# Patient Record
Sex: Female | Born: 1995
Health system: Southern US, Community
[De-identification: ages and names within clinical notes are randomized; demographics above are authoritative.]

## PROBLEM LIST (undated history)

## (undated) DIAGNOSIS — D509 Iron deficiency anemia, unspecified: Secondary | ICD-10-CM

## (undated) DIAGNOSIS — E2740 Unspecified adrenocortical insufficiency: Secondary | ICD-10-CM

## (undated) DIAGNOSIS — K121 Other forms of stomatitis: Secondary | ICD-10-CM

## (undated) DIAGNOSIS — G8929 Other chronic pain: Secondary | ICD-10-CM

## (undated) DIAGNOSIS — J181 Lobar pneumonia, unspecified organism: Secondary | ICD-10-CM

## (undated) DIAGNOSIS — B370 Candidal stomatitis: Secondary | ICD-10-CM

## (undated) DIAGNOSIS — R1310 Dysphagia, unspecified: Secondary | ICD-10-CM

## (undated) DIAGNOSIS — I82401 Acute embolism and thrombosis of unspecified deep veins of right lower extremity: Secondary | ICD-10-CM

## (undated) DIAGNOSIS — R600 Localized edema: Secondary | ICD-10-CM

## (undated) DIAGNOSIS — T451X1A Poisoning by antineoplastic and immunosuppressive drugs, accidental (unintentional), initial encounter: Secondary | ICD-10-CM

## (undated) DIAGNOSIS — B373 Candidiasis of vulva and vagina: Secondary | ICD-10-CM

## (undated) DIAGNOSIS — K1230 Oral mucositis (ulcerative), unspecified: Secondary | ICD-10-CM

## (undated) DIAGNOSIS — E559 Vitamin D deficiency, unspecified: Secondary | ICD-10-CM

## (undated) DIAGNOSIS — R000 Tachycardia, unspecified: Secondary | ICD-10-CM

## (undated) DIAGNOSIS — J45909 Unspecified asthma, uncomplicated: Secondary | ICD-10-CM

## (undated) DIAGNOSIS — D72819 Decreased white blood cell count, unspecified: Secondary | ICD-10-CM

## (undated) DIAGNOSIS — Z3042 Encounter for surveillance of injectable contraceptive: Secondary | ICD-10-CM

## (undated) DIAGNOSIS — I7300 Raynaud's syndrome without gangrene: Secondary | ICD-10-CM

## (undated) DIAGNOSIS — M109 Gout, unspecified: Secondary | ICD-10-CM

## (undated) DIAGNOSIS — R300 Dysuria: Secondary | ICD-10-CM

## (undated) DIAGNOSIS — J302 Other seasonal allergic rhinitis: Secondary | ICD-10-CM

## (undated) DIAGNOSIS — I517 Cardiomegaly: Secondary | ICD-10-CM

## (undated) DIAGNOSIS — J40 Bronchitis, not specified as acute or chronic: Secondary | ICD-10-CM

## (undated) DIAGNOSIS — M329 Systemic lupus erythematosus, unspecified: Secondary | ICD-10-CM

## (undated) DIAGNOSIS — N390 Urinary tract infection, site not specified: Secondary | ICD-10-CM

## (undated) DIAGNOSIS — E872 Acidosis: Secondary | ICD-10-CM

## (undated) DIAGNOSIS — I10 Essential (primary) hypertension: Secondary | ICD-10-CM

## (undated) DIAGNOSIS — E860 Dehydration: Secondary | ICD-10-CM

## (undated) DIAGNOSIS — R0781 Pleurodynia: Secondary | ICD-10-CM

## (undated) DIAGNOSIS — IMO0002 Reserved for concepts with insufficient information to code with codable children: Secondary | ICD-10-CM

## (undated) DIAGNOSIS — M170 Bilateral primary osteoarthritis of knee: Secondary | ICD-10-CM

## (undated) DIAGNOSIS — D849 Immunodeficiency, unspecified: Secondary | ICD-10-CM

## (undated) DIAGNOSIS — G4700 Insomnia, unspecified: Secondary | ICD-10-CM

## (undated) DIAGNOSIS — F329 Major depressive disorder, single episode, unspecified: Secondary | ICD-10-CM

## (undated) DIAGNOSIS — A419 Sepsis, unspecified organism: Secondary | ICD-10-CM

## (undated) HISTORY — DX: Poisoning by antineoplastic and immunosuppressive drugs, accidental (unintentional), initial encounter: T45.1X1A

## (undated) HISTORY — PX: TONSILLECTOMY: SUR1361

## (undated) HISTORY — DX: Localized edema: R60.0

## (undated) HISTORY — DX: Gout, unspecified: M10.9

## (undated) HISTORY — DX: Systemic lupus erythematosus, unspecified: M32.9

## (undated) HISTORY — DX: Immunodeficiency, unspecified: D84.9

## (undated) HISTORY — DX: Other forms of stomatitis: K12.1

## (undated) HISTORY — PX: RENAL BIOPSY: SHX156

## (undated) HISTORY — DX: Unspecified asthma, uncomplicated: J45.909

## (undated) HISTORY — DX: Urinary tract infection, site not specified: N39.0

## (undated) HISTORY — PX: ORIF SHOULDER FRACTURE: SHX5035

---
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
## 2013-10-23 ENCOUNTER — Emergency Department (HOSPITAL_BASED_OUTPATIENT_CLINIC_OR_DEPARTMENT_OTHER): Payer: No Typology Code available for payment source

## 2013-10-23 ENCOUNTER — Emergency Department (HOSPITAL_BASED_OUTPATIENT_CLINIC_OR_DEPARTMENT_OTHER)
Admission: EM | Admit: 2013-10-23 | Discharge: 2013-10-23 | Disposition: A | Discharge status: Home / Self Care | Payer: No Typology Code available for payment source | Attending: Emergency Medicine | Admitting: Emergency Medicine

## 2013-10-23 ENCOUNTER — Encounter (HOSPITAL_BASED_OUTPATIENT_CLINIC_OR_DEPARTMENT_OTHER): Payer: Self-pay | Admitting: Emergency Medicine

## 2013-10-23 DIAGNOSIS — S0990XA Unspecified injury of head, initial encounter: Secondary | ICD-10-CM | POA: Insufficient documentation

## 2013-10-23 DIAGNOSIS — S139XXA Sprain of joints and ligaments of unspecified parts of neck, initial encounter: Secondary | ICD-10-CM | POA: Insufficient documentation

## 2013-10-23 DIAGNOSIS — Y92410 Unspecified street and highway as the place of occurrence of the external cause: Secondary | ICD-10-CM | POA: Insufficient documentation

## 2013-10-23 DIAGNOSIS — S161XXA Strain of muscle, fascia and tendon at neck level, initial encounter: Secondary | ICD-10-CM

## 2013-10-23 DIAGNOSIS — Y9389 Activity, other specified: Secondary | ICD-10-CM | POA: Insufficient documentation

## 2013-10-23 DIAGNOSIS — S46911A Strain of unspecified muscle, fascia and tendon at shoulder and upper arm level, right arm, initial encounter: Secondary | ICD-10-CM

## 2013-10-23 DIAGNOSIS — IMO0002 Reserved for concepts with insufficient information to code with codable children: Secondary | ICD-10-CM | POA: Insufficient documentation

## 2013-10-23 DIAGNOSIS — S0993XA Unspecified injury of face, initial encounter: Secondary | ICD-10-CM | POA: Diagnosis present

## 2013-10-23 MED ORDER — CYCLOBENZAPRINE HCL 10 MG PO TABS: 10.0000 mg | ORAL_TABLET | Freq: Three times a day (TID) | ORAL | Status: DC | PRN

## 2013-10-23 MED ORDER — IBUPROFEN 800 MG PO TABS: 800.0000 mg | ORAL_TABLET | Freq: Three times a day (TID) | ORAL | Status: DC | PRN
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
## 2013-10-23 NOTE — Discharge Instructions (Signed)
Cervical Sprain A cervical sprain is an injury in the neck in which the strong, fibrous tissues (ligaments) that connect your neck bones stretch or tear. Cervical sprains can range from mild to severe. Severe cervical sprains can cause the neck vertebrae to be unstable. This can lead to damage of the spinal cord and can result in serious nervous system problems. The amount of time it takes for a cervical sprain to get better depends on the cause and extent of the injury. Most cervical sprains heal in 1 to 3 weeks. CAUSES  Severe cervical sprains may be caused by:   Contact sport injuries (such as from football, rugby, wrestling, hockey, auto racing, gymnastics, diving, martial arts, or boxing).   Motor vehicle collisions.   Whiplash injuries. This is an injury from a sudden forward-and backward whipping movement of the head and neck.  Falls.  Mild cervical sprains may be caused by:   Being in an awkward position, such as while cradling a telephone between your ear and shoulder.   Sitting in a chair that does not offer proper support.   Working at a poorly Landscape architect station.   Looking up or down for long periods of time.  SYMPTOMS   Pain, soreness, stiffness, or a burning sensation in the front, back, or sides of the neck. This discomfort may develop immediately after the injury or slowly, 24 hours or more after the injury.   Pain or tenderness directly in the middle of the back of the neck.   Shoulder or upper back pain.   Limited ability to move the neck.   Headache.   Dizziness.   Weakness, numbness, or tingling in the hands or arms.   Muscle spasms.   Difficulty swallowing or chewing.   Tenderness and swelling of the neck.  DIAGNOSIS  Most of the time your health care provider can diagnose a cervical sprain by taking your history and doing a physical exam. Your health care provider will ask about previous neck injuries and any known neck  problems, such as arthritis in the neck. X-rays may be taken to find out if there are any other problems, such as with the bones of the neck. Other tests, such as a CT scan or MRI, may also be needed.  TREATMENT  Treatment depends on the severity of the cervical sprain. Mild sprains can be treated with rest, keeping the neck in place (immobilization), and pain medicines. Severe cervical sprains are immediately immobilized. Further treatment is done to help with pain, muscle spasms, and other symptoms and may include:  Medicines, such as pain relievers, numbing medicines, or muscle relaxants.   Physical therapy. This may involve stretching exercises, strengthening exercises, and posture training. Exercises and improved posture can help stabilize the neck, strengthen muscles, and help stop symptoms from returning.  HOME CARE INSTRUCTIONS   Put ice on the injured area.   Put ice in a plastic bag.   Place a towel between your skin and the bag.   Leave the ice on for 15 20 minutes, 3 4 times a day.   If your injury was severe, you may have been given a cervical collar to wear. A cervical collar is a two-piece collar designed to keep your neck from moving while it heals.  Do not remove the collar unless instructed by your health care provider.  If you have long hair, keep it outside of the collar.  Ask your health care provider before making any adjustments to your collar.  Minor adjustments may be required over time to improve comfort and reduce pressure on your chin or on the back of your head.  Ifyou are allowed to remove the collar for cleaning or bathing, follow your health care provider's instructions on how to do so safely.  Keep your collar clean by wiping it with mild soap and water and drying it completely. If the collar you have been given includes removable pads, remove them every 1 2 days and hand wash them with soap and water. Allow them to air dry. They should be completely  dry before you wear them in the collar.  If you are allowed to remove the collar for cleaning and bathing, wash and dry the skin of your neck. Check your skin for irritation or sores. If you see any, tell your health care provider.  Do not drive while wearing the collar.   Only take over-the-counter or prescription medicines for pain, discomfort, or fever as directed by your health care provider.   Keep all follow-up appointments as directed by your health care provider.   Keep all physical therapy appointments as directed by your health care provider.   Make any needed adjustments to your workstation to promote good posture.   Avoid positions and activities that make your symptoms worse.   Warm up and stretch before being active to help prevent problems.  SEEK MEDICAL CARE IF:   Your pain is not controlled with medicine.   You are unable to decrease your pain medicine over time as planned.   Your activity level is not improving as expected.  SEEK IMMEDIATE MEDICAL CARE IF:   You develop any bleeding.  You develop stomach upset.  You have signs of an allergic reaction to your medicine.   Your symptoms get worse.   You develop new, unexplained symptoms.   You have numbness, tingling, weakness, or paralysis in any part of your body.  MAKE SURE YOU:   Understand these instructions.  Will watch your condition.  Will get help right away if you are not doing well or get worse. Document Released: 06/26/2007 Document Revised: 06/19/2013 Document Reviewed: 03/06/2013 [facility] Patient Information 2014 [place].  Motor Vehicle Collision  It is common to have multiple bruises and sore muscles after a motor vehicle collision (MVC). These tend to feel worse for the first 24 hours. You may have the most stiffness and soreness over the first several hours. You may also feel worse when you wake up the first morning after your collision. After this point, you will  usually begin to improve with each day. The speed of improvement often depends on the severity of the collision, the number of injuries, and the location and nature of these injuries. HOME CARE INSTRUCTIONS   Put ice on the injured area.  Put ice in a plastic bag.  Place a towel between your skin and the bag.  Leave the ice on for 15-20 minutes, 03-04 times a day.  Drink enough fluids to keep your urine clear or pale yellow. Do not drink alcohol.  Take a warm shower or bath once or twice a day. This will increase blood flow to sore muscles.  You may return to activities as directed by your caregiver. Be careful when lifting, as this may aggravate neck or back pain.  Only take over-the-counter or prescription medicines for pain, discomfort, or fever as directed by your caregiver. Do not use aspirin. This may increase bruising and bleeding. SEEK IMMEDIATE MEDICAL CARE IF:  You have numbness, tingling, or weakness in the arms or legs.  You develop severe headaches not relieved with medicine.  You have severe neck pain, especially tenderness in the middle of the back of your neck.  You have changes in bowel or bladder control.  There is increasing pain in any area of the body.  You have shortness of breath, lightheadedness, dizziness, or fainting.  You have chest pain.  You feel sick to your stomach (nauseous), throw up (vomit), or sweat.  You have increasing abdominal discomfort.  There is blood in your urine, stool, or vomit.  You have pain in your shoulder (shoulder strap areas).  You feel your symptoms are getting worse. MAKE SURE YOU:   Understand these instructions.  Will watch your condition.  Will get help right away if you are not doing well or get worse. Document Released: 08/29/2005 Document Revised: 11/21/2011 Document Reviewed: 01/26/2011 [facility] Patient Information 2014 [place], [place].  Shoulder Sprain A shoulder sprain is the result of damage to the  tough, fiber-like tissues (ligaments) that help hold your shoulder in place. The ligaments may be stretched or torn. Besides the main shoulder joint (the ball and socket), there are several smaller joints that connect the bones in this area. A sprain usually involves one of those joints. Most often it is the acromioclavicular (or AC) joint. That is the joint that connects the collarbone (clavicle) and the shoulder blade (scapula) at the top point of the shoulder blade (acromion). A shoulder sprain is a mild form of what is called a shoulder separation. Recovering from a shoulder sprain may take some time. For some, pain lingers for several months. Most people recover without long term problems. CAUSES   A shoulder sprain is usually caused by some kind of trauma. This might be:  Falling on an outstretched arm.  Being hit hard on the shoulder.  Twisting the arm.  Shoulder sprains are more likely to occur in people who:  Play sports.  Have balance or coordination problems. SYMPTOMS   Pain when you move your shoulder.  Limited ability to move the shoulder.  Swelling and tenderness on top of the shoulder.  Redness or warmth in the shoulder.  Bruising.  A change in the shape of the shoulder. DIAGNOSIS  Your healthcare provider may:  Ask about your symptoms.  Ask about recent activity that might have caused those symptoms.  Examine your shoulder. You may be asked to do simple exercises to test movement. The other shoulder will be examined for comparison.  Order some tests that provide a look inside the body. They can show the extent of the injury. The tests could include:  X-rays.  CT (computed tomography) scan.  MRI (magnetic resonance imaging) scan. RISKS AND COMPLICATIONS  Loss of full shoulder motion.  Ongoing shoulder pain. TREATMENT  How long it takes to recover from a shoulder sprain depends on how severe it was. Treatment options may include:  Rest. You should  not use the arm or shoulder until it heals.  Ice. For 2 or 3 days after the injury, put an ice pack on the shoulder up to 4 times a day. It should stay on for 15 to 20 minutes each time. Wrap the ice in a towel so it does not touch your skin.  Over-the-counter medicine to relieve pain.  A sling or brace. This will keep the arm still while the shoulder is healing.  Physical therapy or rehabilitation exercises. These will help you regain strength  and motion. Ask your healthcare provider when it is OK to begin these exercises.  Surgery. The need for surgery is rare with a sprained shoulder, but some people may need surgery to keep the joint in place and reduce pain. HOME CARE INSTRUCTIONS   Ask your healthcare provider about what you should and should not do while your shoulder heals.  Make sure you know how to apply ice to the correct area of your shoulder.  Talk with your healthcare provider about which medications should be used for pain and swelling.  If rehabilitation therapy will be needed, ask your healthcare provider to refer you to a therapist. If it is not recommended, then ask about at-home exercises. Find out when exercise should begin. SEEK MEDICAL CARE IF:  Your pain, swelling, or redness at the joint increases. SEEK IMMEDIATE MEDICAL CARE IF:   You have a fever.  You cannot move your arm or shoulder. Document Released: 01/15/2009 Document Revised: 11/21/2011 Document Reviewed: 01/15/2009 [facility] Patient Information 2014 [place], [place].

## 2014-02-07 ENCOUNTER — Encounter (HOSPITAL_COMMUNITY): Payer: Self-pay | Admitting: Emergency Medicine

## 2014-02-07 ENCOUNTER — Emergency Department (HOSPITAL_COMMUNITY)
Admission: EM | Admit: 2014-02-07 | Discharge: 2014-02-07 | Disposition: A | Discharge status: Home / Self Care | Payer: Medicaid Other | Attending: Emergency Medicine | Admitting: Emergency Medicine

## 2014-02-07 ENCOUNTER — Emergency Department (HOSPITAL_COMMUNITY): Payer: Medicaid Other

## 2014-02-07 DIAGNOSIS — Z8679 Personal history of other diseases of the circulatory system: Secondary | ICD-10-CM | POA: Insufficient documentation

## 2014-02-07 DIAGNOSIS — R0602 Shortness of breath: Secondary | ICD-10-CM | POA: Insufficient documentation

## 2014-02-07 DIAGNOSIS — M940 Chondrocostal junction syndrome [Tietze]: Secondary | ICD-10-CM

## 2014-02-07 HISTORY — DX: Raynaud's syndrome without gangrene: I73.00

## 2014-02-07 MED ORDER — IBUPROFEN 800 MG PO TABS
800.0000 mg | ORAL_TABLET | Freq: Once | ORAL | Status: AC
  Administered 2014-02-07: 800 mg via ORAL
  Filled 2014-02-07: qty 1

## 2014-02-07 MED ORDER — IBUPROFEN 800 MG PO TABS: 800.0000 mg | ORAL_TABLET | Freq: Three times a day (TID) | ORAL | Status: DC | PRN

## 2014-02-07 NOTE — ED Notes (Signed)
Initial contact- Mid sternal chest pain starting today at work. Says work is very hot but she became very "cold and my muscles became very tight right before the chest pain started." Denies precipitating events. Endorses nausea but no vomiting. Denies fever, cough.Says deep breaths hurt. 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 Patient is not on birth control or any other medications. No recent surgeries or hospitalization. No other complaints at this time. Moving all 4 extremities. MD Mingo Amber at the bedside.

## 2014-02-07 NOTE — ED Provider Notes (Signed)
CSN: [id number]     Arrival date & time 02/07/14  2017 History   First MD Initiated Contact with Patient 02/07/14 2106     Chief Complaint  Patient presents with  . Chest Pain     (Consider location/radiation/quality/duration/timing/severity/associated sxs/prior Treatment) Patient is a 18 y.o. female presenting with chest pain. The history is provided by the patient.  Chest Pain Pain location:  Substernal area Pain quality: sharp   Pain radiates to:  Does not radiate Pain radiates to the back: no   Pain severity:  Moderate Onset quality:  Sudden Timing:  Constant Progression:  Waxing and waning Chronicity:  New Context: not breathing, not at rest and no stress   Context comment:  Working - cutting pizza Relieved by:  Nothing Worsened by:  Nothing tried Ineffective treatments:  None tried Associated symptoms: no abdominal pain, no back pain, no cough, no dizziness, no fever, no lower extremity edema, no shortness of breath and not vomiting     Past Medical History  Diagnosis Date  . Raynaud disease    Past Surgical History  Procedure Laterality Date  . Fracture surgery     No family history on file. History  Substance Use Topics  . Smoking status: Never Smoker   . Smokeless tobacco: Not on file  . Alcohol Use: No   OB History   Grav Para Term Preterm Abortions TAB SAB Ect Mult Living                 Review of Systems  Constitutional: Negative for fever.  Respiratory: Negative for cough and shortness of breath.   Cardiovascular: Positive for chest pain.  Gastrointestinal: Negative for vomiting and abdominal pain.  Musculoskeletal: Negative for back pain.  Neurological: Negative for dizziness.  All other systems reviewed and are negative.     Allergies  Review of patient's allergies indicates no known allergies.  Home Medications   Prior to Admission medications   Medication Sig Start Date End Date Taking? Authorizing Provider  cyclobenzaprine  (FLEXERIL) 10 MG tablet Take 1 tablet (10 mg total) by mouth 3 (three) times daily as needed for muscle spasms. 10/23/13   Charles B. Karle Starch, MD  ibuprofen (ADVIL,MOTRIN) 800 MG tablet Take 1 tablet (800 mg total) by mouth every 8 (eight) hours as needed. 10/23/13   Charles B. Karle Starch, MD   BP 125/71  Pulse 98  Temp(Src) 98.4 F (36.9 C) (Oral)  Resp 20  SpO2 100%  LMP 01/18/2014 Physical Exam  Nursing note and vitals reviewed. Constitutional: She is oriented to person, place, and time. She appears well-developed and well-nourished. No distress.  HENT:  Head: Normocephalic and atraumatic.  Mouth/Throat: Oropharynx is clear and moist. No oropharyngeal exudate.  Eyes: EOM are normal. Pupils are equal, round, and reactive to light.  Neck: Normal range of motion. Neck supple.  Cardiovascular: Normal rate and regular rhythm.  Exam reveals no friction rub.   No murmur heard. Pulmonary/Chest: Effort normal and breath sounds normal. No respiratory distress. She has no wheezes. She has no rales. She exhibits tenderness (Lower sternum).  Abdominal: Soft. She exhibits no distension. There is no tenderness. There is no rebound.  Musculoskeletal: Normal range of motion. She exhibits no edema.  Neurological: She is alert and oriented to person, place, and time.  Skin: No rash noted. She is not diaphoretic.    ED Course  Procedures (including critical care time) Labs Review Labs Reviewed - No data to display  Imaging Review Dg Chest  2 View  02/07/2014   CLINICAL DATA:  Mid chest pain  EXAM: CHEST  2 VIEW  COMPARISON:  09/04/2008  FINDINGS: The heart size and mediastinal contours are within normal limits. Both lungs are clear. The visualized skeletal structures are unremarkable.  IMPRESSION: No active cardiopulmonary disease.   Electronically Signed   By: Kathreen Devoid   On: 02/07/2014 21:43     EKG Interpretation None       Date: 02/07/2014  Rate: 95  Rhythm: normal sinus rhythm  QRS  Axis: normal  Intervals: Short PR  ST/T Wave abnormalities: normal  Conduction Disutrbances:none  Narrative Interpretation:   Old EKG Reviewed: unchanged   MDM   Final diagnoses:  Costochondritis, acute    56F with sharp central chest pain. History not c/w PE, no risk factors. Patient has sharp pain on lower sternal palpation. Exam c/w costochondritis. Mild SOB because it hurts to breathe, likely due to her costochondral irritation. CXR normal. EKG ok. Improving with motrin. Stable for discharge.    Osvaldo Shipper, MD 02/07/14 [phone]

## 2014-02-07 NOTE — Progress Notes (Signed)
  CARE MANAGEMENT ED NOTE 02/07/2014  Patient:  MARVELINE, PROFETA   Account Number:  [id number]  Date Initiated:  02/07/2014  Documentation initiated by:  Livia Snellen  Subjective/Objective Assessment:   Patient presents to Ed with chest pain.     Subjective/Objective Assessment Detail:     Action/Plan:   Action/Plan Detail:   Anticipated DC Date:       Status Recommendation to Physician:   Result of Recommendation:    Other ED [place]  Other  PCP issues    Choice offered to / List presented to:            Status of service:  Completed, signed off  ED Comments:   ED Comments Detail:  EDCM spoke to patient at bedside.  Patient reports she does not have a pcp with Medicaid insurance.  Upon record review, pcp listed on patient's Medicaid card is [facility] on [address] in [place].  EDCM explained to patient if she would like to change her pcp, she would need to call the DSS.  EDCM also provided patient with pcps who accept Medicaid in [facility].  Patient thankful for assistance.  No further EDCM needs at this time.

## 2014-02-07 NOTE — Discharge Instructions (Signed)
Costochondritis Costochondritis, sometimes called Tietze syndrome, is a swelling and irritation (inflammation) of the tissue (cartilage) that connects your ribs with your breastbone (sternum). It causes pain in the chest and rib area. Costochondritis usually goes away on its own over time. It can take up to 6 weeks or longer to get better, especially if you are unable to limit your activities. CAUSES  Some cases of costochondritis have no known cause. Possible causes include:  Injury (trauma).  Exercise or activity such as lifting.  Severe coughing. SIGNS AND SYMPTOMS  Pain and tenderness in the chest and rib area.  Pain that gets worse when coughing or taking deep breaths.  Pain that gets worse with specific movements. DIAGNOSIS  Your health care provider will do a physical exam and ask about your symptoms. Chest X-rays or other tests may be done to rule out other problems. TREATMENT  Costochondritis usually goes away on its own over time. Your health care provider may prescribe medicine to help relieve pain. HOME CARE INSTRUCTIONS   Avoid exhausting physical activity. Try not to strain your ribs during normal activity. This would include any activities using chest, abdominal, and side muscles, especially if heavy weights are used.  Apply ice to the affected area for the first 2 days after the pain begins.  Put ice in a plastic bag.  Place a towel between your skin and the bag.  Leave the ice on for 20 minutes, 2 3 times a day.  Only take over-the-counter or prescription medicines as directed by your health care provider. SEEK MEDICAL CARE IF:  You have redness or swelling at the rib joints. These are signs of infection.  Your pain does not go away despite rest or medicine. SEEK IMMEDIATE MEDICAL CARE IF:   Your pain increases or you are very uncomfortable.  You have shortness of breath or difficulty breathing.  You cough up blood.  You have worse chest pains,  sweating, or vomiting.  You have a fever or persistent symptoms for more than 2 3 days.  You have a fever and your symptoms suddenly get worse. MAKE SURE YOU:   Understand these instructions.  Will watch your condition.  Will get help right away if you are not doing well or get worse. Document Released: 06/08/2005 Document Revised: 06/19/2013 Document Reviewed: 04/02/2013 [facility] Patient Information 2014 [place].   Emergency Department Resource Guide 1) Find a Doctor and Pay Out of Pocket Although you won't have to find out who is covered by your insurance plan, it is a good idea to ask around and get recommendations. You will then need to call the office and see if the doctor you have chosen will accept you as a new patient and what types of options they offer for patients who are self-pay. Some doctors offer discounts or will set up payment plans for their patients who do not have insurance, but you will need to ask so you aren't surprised when you get to your appointment.  2) Contact Your Local Health Department Not all health departments have doctors that can see patients for sick visits, but many do, so it is worth a call to see if yours does. If you don't know where your local health department is, you can check in your phone book. The CDC also has a tool to help you locate your state's health department, and many state websites also have listings of all of their local health departments.  3) Find a [employer] If your  illness is not likely to be very severe or complicated, you may want to try a walk in clinic. These are popping up all over the country in pharmacies, drugstores, and shopping centers. They're usually staffed by nurse practitioners or physician assistants that have been trained to treat common illnesses and complaints. They're usually fairly quick and inexpensive. However, if you have serious medical issues or chronic medical problems, these are probably not  your best option.  No Primary Care Doctor: - Call Health Connect at  [phone] - they can help you locate a primary care doctor that  accepts your insurance, provides certain services, etc. - Physician Referral Service- [phone]  Chronic Pain Problems: Organization         Address  Phone   Notes  [facility]  [phone] Patients need to be referred by their primary care doctor.   Medication Assistance: Organization         Address  Phone   Notes  [facility] Medication [facility] [address] [place], [place] [address] [phone] --Must be a resident of [facility] -- Must have NO insurance coverage whatsoever (no Medicaid/ Medicare, etc.) -- The pt. MUST have a primary care doctor that directs their care regularly and follows them in the community   MedAssist  [phone]   [employer]  [phone]    Agencies that provide inexpensive medical care: Organization         Address  Phone   Notes  [place]  [phone]   [facility]    [phone]   [facility] [place], [place] [address] [phone]   [place] [address] [place] [phone]   Planned Parenthood    [phone]   [facility]    [phone]   [place] and [place] [address], [place] Phone:  [phone], Fax:  [phone] Hours of Operation:  9 am - 6 pm, M-F.  Also accepts Medicaid/Medicare and self-pay.  [facility] for [place], [address], [place] Phone: [phone], Fax: [phone]. Hours of Operation:  8:30 am - 5:30 pm, M-F.  Also accepts Medicaid and self-pay.  [facility] [place] [address] [place] Phone: [phone]   [place], [place], [place] [phone], Ext. [phone] Mondays & Thursdays: 7-9 AM.  First  15 patients are seen on a first come, first serve basis.    [place] Providers:  Organization         Address  Phone   Notes  [facility] [address], [place] [phone] Also accepts self-pay patients.  [facility] [address], [place]  [phone]   [place], [address], [place] [phone]   [facility] [address] [place] [phone]   Lucianne Lei [address], [place]   [phone] Only accepts [place] Access [place] patients after they have their name applied to their card.   Self-Pay (no insurance) in [facility]:  Organization         Address  Phone   Notes  Sickle Cell Patients, [facility] [place], [place] [phone]  [facility] [place] [phone]   [facility] [place]  [place] [place], [address], [place] [phone]   [facility]/Dr. Osei-Bonsu  [address] [place] or [address], [place] [phone] Phone number for both [place] and [place] locations is the same.  Urgent Medical and [facility] [address], [place] [phone]   [facility] [address] [place] or [address] [phone]   [facility] [address], [place] [phone], phone; [phone], fax Sees patients 1st and 3rd Saturday of every month.  Must not qualify for public or private insurance (i.e. Medicaid, Medicare, [place] Health Choice, Veterans' Benefits)  Household income should be no more than 200% of the poverty level The clinic cannot treat you if you are pregnant or think you are pregnant  Sexually transmitted diseases are not treated at the clinic.    Dental  Care: Organization         Address  Phone  Notes  [facility] Department of [facility] [phone] Accepts children up to age 63 who are enrolled in [place] or [place]; pregnant women with a Medicaid card; and children who have applied for Medicaid or [place] Health Choice, but were declined, whose parents can pay a reduced fee at time of service.  [facility] Department of [facility]  [address], [place] [phone] Accepts children up to age 5 who are enrolled in [place] or [place]; pregnant women with a Medicaid card; and children who have applied for Medicaid or [place] Health Choice, but were declined, whose parents can pay a reduced fee at time of service.  [place] Adult [facility] Access PROGRAM  [place] [phone] Patients are seen by appointment only. Walk-ins are not accepted. [place] will see patients 20 years of age and older. Monday - Tuesday (8am-5pm) Most Wednesdays (8:30-5pm) $30 per visit, cash only  [facility] Adult Dental Access PROGRAM  [address], [facility] [phone] Patients are seen by appointment only. Walk-ins are not accepted. [place] will see patients 40 years of age and older. One Wednesday Evening (Monthly: Volunteer Based).  $30 per visit, cash only  [place]  [phone] for adults; Children under age 40, call Graduate Pediatric Dentistry at [phone]. Children aged 52-14, please call [phone] to request a pediatric application.  Dental services are provided in all areas of dental care including fillings, crowns and bridges, complete and partial dentures, implants, gum treatment, root canals, and extractions. Preventive care is also provided. Treatment is provided to both adults and children. Patients are selected via a lottery and there is often a waiting list.   [facility] [address] [place]  [phone] [email or web address]   [facility] [address] [place], [place] [phone], Ext. [phone] Second and Fourth Thursday of each month, opens at 6:30 AM; Clinic ends at 9 AM.  Patients are seen on a first-come first-served basis, and a limited number are seen during each clinic.   [facility]  [address] Hillard Danker [place], [place] [phone]   Eligibility Requirements You must have lived in [place], [place], or [place] counties for at least the  last three months.   You cannot be eligible for state or federal sponsored [employer], including [employer], [place], or [employer].   You generally cannot be eligible for healthcare insurance through your employer.    How to apply: Eligibility screenings are held every Tuesday and Wednesday afternoon from 1:00 pm until 4:00 pm. You do not need an appointment for the interview!  [facility] [address] [place], [place]   [place]  [place] Department  [place]  [phone]    Behavioral Health Resources in the Community: Intensive Outpatient Programs Organization         Address  Phone  Notes  [place]ula Antigo. [address] [place], [place] [phone]   [facility] Outpatient [address] [place], [place]   ADS: Alcohol & Drug Svcs [address], [place], [place]   [place] [address],  [name], [place] or [phone]   Substance Abuse Resources Organization         Address  Phone  Notes  Alcohol and Drug Services  [phone]   [place]  [phone]   [facility] [place]   Chinita Pester  [phone]   Residential & Outpatient Substance Abuse Program  [phone]    Psychological Services Organization         Address  Phone  Notes  [facility] [place]  [place]  [phone]   [place] [address], [place] or [phone]    Mobile Crisis Teams Organization         Address  Phone  Notes  [facility]rapeutic Alternatives, Mobile Crisis Care Unit  [phone]   [facility] Psychotherapeutic Services  [address] [place], [place]   [place] [address] [place] [phone]    Self-Help/Support Groups Organization         Address  Phone             Notes  [place]. of [place] - variety of support groups  [place] Call for more information  Narcotics Anonymous (NA), [facility]aring [facility] [address], [employer] [place]  2 meetings at this location   Special educational needs teacher         Address  Phone  Notes  ASAP Residential Treatment [place],    [place]  [phone]   [facility]  [address] [place] [phone], [place], [place]   [place], [place] [phone] Admissions: 8am-3pm M-F  Incentives Substance [place] [address]    [place], [place] [phone]   [facility] [address] [place], [place], [place]   [facility] [address],  [place], [place]   [facility] Programs - Intensive Outpatient [address], [place], [place]   [facility] ([place].,  [place], [place] [phone] or [phone]   [facility] Services (RTS) [address], [place], [place] Accepts Medicaid  [facility] [place] [address],  [place] [phone] Substance Abuse/Addiction Treatment   [facility] Organization         Address  Phone  Notes  [facility]  [phone]  Domenic Schwab, PhD [address] Arlis Porta [place], [place]   [phone] or [phone]   [facility], [place] [phone]   [address], [place], [place] [phone] Insurance/Medicaid/sponsorship through [facility] and Families [address], [address] [place]                                    [place], [place] [phone] [place] [address], [place] [phone]    Dr. Adele Schilder  [phone]   [facility] Dept. 1) [address] [place] 2) [place] 3)  [place] [address], [place] [phone]  [phone]   [place] [phone] or [phone] (After Hours)

## 2014-02-07 NOTE — ED Notes (Signed)
Pt reports mid-sternal cp which started while she was at work today.  Pt reports pain is intermittent and lasts for 2 minutes each time it occurs.  Pt reports SOB and nausea with the cp.  Pt reports hx of Raynaud's syndrome.  States that she became cold and clammy prior to the first episode.  Denies any cp at this time.

## 2014-03-10 ENCOUNTER — Emergency Department (HOSPITAL_COMMUNITY): Payer: Medicaid Other

## 2014-03-10 ENCOUNTER — Emergency Department (HOSPITAL_COMMUNITY)
Admission: EM | Admit: 2014-03-10 | Discharge: 2014-03-10 | Disposition: A | Discharge status: Home / Self Care | Payer: Medicaid Other | Attending: Emergency Medicine | Admitting: Emergency Medicine

## 2014-03-10 ENCOUNTER — Encounter (HOSPITAL_COMMUNITY): Payer: Self-pay | Admitting: Emergency Medicine

## 2014-03-10 DIAGNOSIS — J069 Acute upper respiratory infection, unspecified: Secondary | ICD-10-CM | POA: Insufficient documentation

## 2014-03-10 DIAGNOSIS — Z8679 Personal history of other diseases of the circulatory system: Secondary | ICD-10-CM | POA: Insufficient documentation

## 2014-03-10 DIAGNOSIS — Z79899 Other long term (current) drug therapy: Secondary | ICD-10-CM | POA: Insufficient documentation

## 2014-03-10 MED ORDER — GUAIFENESIN 200 MG PO TABS: 200.0000 mg | ORAL_TABLET | ORAL | Status: DC | PRN

## 2014-03-10 MED ORDER — GUAIFENESIN 100 MG/5ML PO SOLN
5.0000 mL | Freq: Once | ORAL | Status: AC
  Administered 2014-03-10: 100 mg via ORAL
  Filled 2014-03-10: qty 5

## 2014-03-10 MED ORDER — LORATADINE 10 MG PO TABS: 10.0000 mg | ORAL_TABLET | Freq: Every day | ORAL | Status: DC

## 2014-03-10 MED ORDER — BENZONATATE 100 MG PO CAPS: 100.0000 mg | ORAL_CAPSULE | Freq: Three times a day (TID) | ORAL | Status: DC

## 2014-03-10 MED ORDER — BENZONATATE 100 MG PO CAPS
100.0000 mg | ORAL_CAPSULE | Freq: Once | ORAL | Status: AC
  Administered 2014-03-10: 100 mg via ORAL
  Filled 2014-03-10: qty 1

## 2014-03-10 NOTE — ED Notes (Signed)
Pt states cough, congestion, and sore throat x 3 days.  Pt states cough is productive.  Unknown for fever.  No n/v.

## 2014-03-10 NOTE — ED Provider Notes (Signed)
CSN: [id number]     Arrival date & time 03/10/14  9449 History   First MD Initiated Contact with Patient 03/10/14 [phone]     Chief Complaint  Patient presents with  . Cough  . Nasal Congestion     (Consider location/radiation/quality/duration/timing/severity/associated sxs/prior Treatment) HPI  Patient presents to the emergency department for evaluation of nasal congestion, sore throat and cough for 3 days. She reports the cough is productive and a dark green color. She gets the sensation to cough after the feeling of mucus going down the back of her throat. She's not had any fevers, nausea, vomiting, diarrhea, weakness, chills. She is healthy at baseline,takes no daily medications and has tried Tylenol and cough drops for symptom control.  Past Medical History  Diagnosis Date  . Raynaud disease    Past Surgical History  Procedure Laterality Date  . Fracture surgery     History reviewed. No pertinent family history. History  Substance Use Topics  . Smoking status: Never Smoker   . Smokeless tobacco: Not on file  . Alcohol Use: No   OB History   Grav Para Term Preterm Abortions TAB SAB Ect Mult Living                 Review of Systems   Review of Systems  Gen: no weight loss, fevers, chills, night sweats  Eyes: no discharge or drainage, no occular pain or visual changes  Nose: no epistaxis, + rhinorrhea  Mouth: no dental pain, + sore throat  Neck: no neck pain  Lungs:No wheezing or hemoptysis + coughing CV: no chest pain, palpitations, dependent edema or orthopnea  Abd: no abdominal pain, nausea, vomiting, diarrhea GU: no dysuria or gross hematuria  MSK:  No muscle weakness or pain Neuro: no headache, no focal neurologic deficits  Skin: no rash or wounds Psyche: no complaints    Allergies  Review of patient's allergies indicates no known allergies.  Home Medications   Prior to Admission medications   Medication Sig Start Date End Date Taking? Authorizing  Norris Brumbach  acetaminophen (TYLENOL) 325 MG tablet Take 325 mg by mouth every 6 (six) hours as needed for mild pain.   Yes Historical Oluwatoni Rotunno, MD  oxymetazoline (AFRIN) 0.05 % nasal spray Place 1 spray into both nostrils 2 (two) times daily.   Yes Historical Fredrik Mogel, MD  benzonatate (TESSALON) 100 MG capsule Take 1 capsule (100 mg total) by mouth every 8 (eight) hours. 03/10/14   Tiffany Marilu Favre, PA-C  guaiFENesin 200 MG tablet Take 1 tablet (200 mg total) by mouth every 4 (four) hours as needed for cough or to loosen phlegm. 03/10/14   Tiffany Marilu Favre, PA-C  loratadine (CLARITIN) 10 MG tablet Take 1 tablet (10 mg total) by mouth daily. 03/10/14   Tiffany Marilu Favre, PA-C   BP 135/84  Pulse 98  Temp(Src) 98.4 F (36.9 C) (Oral)  Resp 20  SpO2 100%  LMP 02/17/2014 Physical Exam  Nursing note and vitals reviewed. Constitutional: She appears well-developed and well-nourished. No distress.  HENT:  Head: Normocephalic and atraumatic.  Right Ear: External ear normal.  Nose: Nose normal.  Mouth/Throat: Oropharynx is clear and moist. No oropharyngeal exudate.  Eyes: Pupils are equal, round, and reactive to light.  Neck: Normal range of motion. Neck supple.  Cardiovascular: Normal rate and regular rhythm.   Pulmonary/Chest: Effort normal. She has no wheezes. She has no rales.  Abdominal: Soft.  Neurological: She is alert.  Skin: Skin is warm and dry.  ED Course  Procedures (including critical care time) Labs Review Labs Reviewed - No data to display  Imaging Review Dg Chest 2 View  03/10/2014   CLINICAL DATA:  Cough.  Nasal congestion.  Raynaud disease.  EXAM: CHEST  2 VIEW  COMPARISON:  02/07/2014  FINDINGS: The heart size and mediastinal contours are within normal limits. Both lungs are clear. The visualized skeletal structures are unremarkable.  IMPRESSION: No active cardiopulmonary disease.   Electronically Signed   By: Earle Gell M.D.   On: 03/10/2014 10:05     EKG  Interpretation None      MDM   Final diagnoses:  URI (upper respiratory infection)   9: 41 am Patient with what appears to be viral like symptoms. Will get chest xray to r/o pneumonia. Given Tessalon perls and guaifenesin in the ED  10: 30 am Patient has symptoms consistent with URI and reassuring  Chest xray and vital signs.  benzonatate (TESSALON) 100 MG capsule Take 1 capsule (100 mg total) by mouth every 8 (eight) hours. 21 capsule Linus Mako, PA-C   guaiFENesin 200 MG tablet Take 1 tablet (200 mg total) by mouth every 4 (four) hours as needed for cough or to loosen phlegm. 30 suppository Tiffany Marilu Favre, PA-C   loratadine (CLARITIN) 10 MG tablet Take 1 tablet (10 mg total) by mouth daily. 30 tablet Linus Mako, PA-C  Rest, fluids and take medication as prescribed.  18 y.o.[name] [name]'s evaluation in the Emergency Department is complete. It has been determined that no acute conditions requiring further emergency intervention are present at this time. The patient/guardian have been advised of the diagnosis and plan. We have discussed signs and symptoms that warrant return to the ED, such as changes or worsening in symptoms.  Vital signs are stable at discharge. Filed Vitals:   03/10/14 0856  BP: 135/84  Pulse: 98  Temp: 98.4 F (36.9 C)  Resp: 20    Patient/guardian has voiced understanding and agreed to follow-up with the PCP or specialist.     Linus Mako, PA-C 03/10/14 1031

## 2014-03-10 NOTE — Discharge Instructions (Signed)
Upper Respiratory Infection, Adult An upper respiratory infection (URI) is also sometimes known as the common cold. The upper respiratory tract includes the nose, sinuses, throat, trachea, and bronchi. Bronchi are the airways leading to the lungs. Most people improve within 1 week, but symptoms can last up to 2 weeks. A residual cough may last even longer.  CAUSES Many different viruses can infect the tissues lining the upper respiratory tract. The tissues become irritated and inflamed and often become very moist. Mucus production is also common. A cold is contagious. You can easily spread the virus to others by oral contact. This includes kissing, sharing a glass, coughing, or sneezing. Touching your mouth or nose and then touching a surface, which is then touched by another person, can also spread the virus. SYMPTOMS  Symptoms typically develop 1 to 3 days after you come in contact with a cold virus. Symptoms vary from person to person. They may include:  Runny nose.  Sneezing.  Nasal congestion.  Sinus irritation.  Sore throat.  Loss of voice (laryngitis).  Cough.  Fatigue.  Muscle aches.  Loss of appetite.  Headache.  Low-grade fever. DIAGNOSIS  You might diagnose your own cold based on familiar symptoms, since most people get a cold 2 to 3 times a year. Your caregiver can confirm this based on your exam. Most importantly, your caregiver can check that your symptoms are not due to another disease such as strep throat, sinusitis, pneumonia, asthma, or epiglottitis. Blood tests, throat tests, and X-rays are not necessary to diagnose a common cold, but they may sometimes be helpful in excluding other more serious diseases. Your caregiver will decide if any further tests are required. RISKS AND COMPLICATIONS  You may be at risk for a more severe case of the common cold if you smoke cigarettes, have chronic heart disease (such as heart failure) or lung disease (such as asthma), or if  you have a weakened immune system. The very young and very old are also at risk for more serious infections. Bacterial sinusitis, middle ear infections, and bacterial pneumonia can complicate the common cold. The common cold can worsen asthma and chronic obstructive pulmonary disease (COPD). Sometimes, these complications can require emergency medical care and may be life-threatening. PREVENTION  The best way to protect against getting a cold is to practice good hygiene. Avoid oral or hand contact with people with cold symptoms. Wash your hands often if contact occurs. There is no clear evidence that vitamin C, vitamin E, echinacea, or exercise reduces the chance of developing a cold. However, it is always recommended to get plenty of rest and practice good nutrition. TREATMENT  Treatment is directed at relieving symptoms. There is no cure. Antibiotics are not effective, because the infection is caused by a virus, not by bacteria. Treatment may include:  Increased fluid intake. Sports drinks offer valuable electrolytes, sugars, and fluids.  Breathing heated mist or steam (vaporizer or shower).  Eating chicken soup or other clear broths, and maintaining good nutrition.  Getting plenty of rest.  Using gargles or lozenges for comfort.  Controlling fevers with ibuprofen or acetaminophen as directed by your caregiver.  Increasing usage of your inhaler if you have asthma. Zinc gel and zinc lozenges, taken in the first 24 hours of the common cold, can shorten the duration and lessen the severity of symptoms. Pain medicines may help with fever, muscle aches, and throat pain. A variety of non-prescription medicines are available to treat congestion and runny nose. Your caregiver   can make recommendations and may suggest nasal or lung inhalers for other symptoms.  HOME CARE INSTRUCTIONS   Only take over-the-counter or prescription medicines for pain, discomfort, or fever as directed by your  caregiver.  Use a warm mist humidifier or inhale steam from a shower to increase air moisture. This may keep secretions moist and make it easier to breathe.  Drink enough water and fluids to keep your urine clear or pale yellow.  Rest as needed.  Return to work when your temperature has returned to normal or as your caregiver advises. You may need to stay home longer to avoid infecting others. You can also use a face mask and careful hand washing to prevent spread of the virus. SEEK MEDICAL CARE IF:   After the first few days, you feel you are getting worse rather than better.  You need your caregiver's advice about medicines to control symptoms.  You develop chills, worsening shortness of breath, or brown or red sputum. These may be signs of pneumonia.  You develop yellow or brown nasal discharge or pain in the face, especially when you bend forward. These may be signs of sinusitis.  You develop a fever, swollen neck glands, pain with swallowing, or white areas in the back of your throat. These may be signs of strep throat. SEEK IMMEDIATE MEDICAL CARE IF:   You have a fever.  You develop severe or persistent headache, ear pain, sinus pain, or chest pain.  You develop wheezing, a prolonged cough, cough up blood, or have a change in your usual mucus (if you have chronic lung disease).  You develop sore muscles or a stiff neck. Document Released: 02/22/2001 Document Revised: 11/21/2011 Document Reviewed: 12/31/2010 [employer] Patient Information 2015 [employer]. This information is not intended to replace advice given to you by your health care provider. Make sure you discuss any questions you have with your health care provider.  

## 2014-03-10 NOTE — ED Provider Notes (Signed)
Medical screening examination/treatment/procedure(s) were performed by non-physician practitioner and as supervising physician I was immediately available for consultation/collaboration.   EKG Interpretation None        Ezequiel Essex, MD 03/10/14 1616

## 2014-03-10 NOTE — ED Notes (Signed)
Patient transported to X-ray 

## 2014-04-04 ENCOUNTER — Encounter (HOSPITAL_COMMUNITY): Payer: Self-pay | Admitting: Emergency Medicine

## 2014-04-04 ENCOUNTER — Emergency Department (HOSPITAL_COMMUNITY)
Admission: EM | Admit: 2014-04-04 | Discharge: 2014-04-04 | Disposition: A | Discharge status: Home / Self Care | Payer: Medicaid Other | Attending: Emergency Medicine | Admitting: Emergency Medicine

## 2014-04-04 ENCOUNTER — Emergency Department (HOSPITAL_COMMUNITY): Payer: Medicaid Other

## 2014-04-04 DIAGNOSIS — B9789 Other viral agents as the cause of diseases classified elsewhere: Secondary | ICD-10-CM

## 2014-04-04 DIAGNOSIS — J069 Acute upper respiratory infection, unspecified: Secondary | ICD-10-CM | POA: Insufficient documentation

## 2014-04-04 DIAGNOSIS — Z79899 Other long term (current) drug therapy: Secondary | ICD-10-CM | POA: Diagnosis not present

## 2014-04-04 DIAGNOSIS — R519 Headache, unspecified: Secondary | ICD-10-CM

## 2014-04-04 DIAGNOSIS — J988 Other specified respiratory disorders: Secondary | ICD-10-CM

## 2014-04-04 DIAGNOSIS — R51 Headache: Secondary | ICD-10-CM | POA: Insufficient documentation

## 2014-04-04 DIAGNOSIS — Z8669 Personal history of other diseases of the nervous system and sense organs: Secondary | ICD-10-CM | POA: Diagnosis not present

## 2014-04-04 MED ORDER — SALINE SPRAY 0.65 % NA SOLN: 1.0000 | NASAL | Status: DC | PRN

## 2014-04-04 MED ORDER — METOCLOPRAMIDE HCL 5 MG/ML IJ SOLN
10.0000 mg | Freq: Once | INTRAMUSCULAR | Status: AC
  Administered 2014-04-04: 10 mg via INTRAMUSCULAR
  Filled 2014-04-04: qty 2

## 2014-04-04 MED ORDER — ALBUTEROL SULFATE HFA 108 (90 BASE) MCG/ACT IN AERS: 1.0000 | INHALATION_SPRAY | Freq: Four times a day (QID) | RESPIRATORY_TRACT | Status: DC | PRN

## 2014-04-04 MED ORDER — DIPHENHYDRAMINE HCL 50 MG/ML IJ SOLN
25.0000 mg | Freq: Once | INTRAMUSCULAR | Status: AC
  Administered 2014-04-04: 25 mg via INTRAMUSCULAR
  Filled 2014-04-04: qty 1

## 2014-04-04 NOTE — ED Notes (Signed)
[facility] [place] EDPA aware of pt's elevated BP

## 2014-04-04 NOTE — ED Provider Notes (Signed)
CSN: [id number]     Arrival date & time 04/04/14  1123 History   First MD Initiated Contact with Patient 04/04/14 1154     Chief Complaint  Patient presents with  . Headache  . Chest Congestion      (Consider location/radiation/quality/duration/timing/severity/associated sxs/prior Treatment) HPI Comments: Patient is a 18 year old female history significant for Raynaud disease presenting to the emergency department for 2 complaints. First complaint is a bilateral temporal throbbing sharp headache with associated photophobia but she has had on and off for the last week. Alleviating factors: none. Aggravating factors: light. Medications tried prior to arrival: none. Patient's second complaint is continued nasal congestion and a nonproductive cough after finishing course of antibiotics for pneumonia at the end of June. She states she completed the antibiotic but continues to have a lingering cough. Alleviating factors: none. Aggravating factors: none. Medications tried prior to arrival: none. Denies any fevers, chills, nausea, vomiting, diarrhea.       Patient is a 18 y.o. female presenting with headaches.  Headache Associated symptoms: cough   Associated symptoms: no fever     Past Medical History  Diagnosis Date  . Raynaud disease    Past Surgical History  Procedure Laterality Date  . Fracture surgery     No family history on file. History  Substance Use Topics  . Smoking status: Never Smoker   . Smokeless tobacco: Not on file  . Alcohol Use: No   OB History   Grav Para Term Preterm Abortions TAB SAB Ect Mult Living                 Review of Systems  Constitutional: Negative for fever and chills.  Respiratory: Positive for cough and chest tightness.   Neurological: Positive for headaches.  All other systems reviewed and are negative.     Allergies  Review of patient's allergies indicates no known allergies.  Home Medications   Prior to Admission medications    Medication Sig Start Date End Date Taking? Authorizing Provider  acetaminophen (TYLENOL) 325 MG tablet Take 325 mg by mouth every 6 (six) hours as needed for mild pain.   Yes Historical Provider, MD  oxymetazoline (AFRIN) 0.05 % nasal spray Place 1 spray into both nostrils 2 (two) times daily.   Yes Historical Provider, MD  albuterol (PROVENTIL HFA;VENTOLIN HFA) 108 (90 BASE) MCG/ACT inhaler Inhale 1-2 puffs into the lungs every 6 (six) hours as needed for wheezing or shortness of breath. 04/04/14   Jamus Loving L Deeana Atwater, PA-C  sodium chloride (OCEAN) 0.65 % SOLN nasal spray Place 1 spray into both nostrils as needed for congestion. 04/04/14   Shreyansh Tiffany L Adelisa Satterwhite, PA-C   BP 142/90  Pulse 91  Temp(Src) 99.2 F (37.3 C) (Oral)  Resp 16  SpO2 100%  LMP 02/17/2014 Physical Exam  Nursing note and vitals reviewed. Constitutional: She is oriented to person, place, and time. She appears well-developed and well-nourished. No distress.  HENT:  Head: Normocephalic and atraumatic.  Right Ear: External ear normal.  Left Ear: External ear normal.  Nose: Nose normal.  Mouth/Throat: Oropharynx is clear and moist. No oropharyngeal exudate.  Eyes: Conjunctivae and EOM are normal. Pupils are equal, round, and reactive to light.  Neck: Normal range of motion. Neck supple.  Cardiovascular: Normal rate, regular rhythm, normal heart sounds and intact distal pulses.   Pulmonary/Chest: Effort normal and breath sounds normal. No respiratory distress. She exhibits no tenderness.  Abdominal: Soft. There is no tenderness.  Neurological: She is alert  and oriented to person, place, and time. She has normal strength. No cranial nerve deficit. Gait normal. GCS eye subscore is 4. GCS verbal subscore is 5. GCS motor subscore is 6.  Sensation grossly intact.  No pronator drift.  Bilateral heel-knee-shin intact.  Skin: Skin is warm and dry. She is not diaphoretic.    ED Course  Procedures (including critical care  time) Medications  diphenhydrAMINE (BENADRYL) injection 25 mg (25 mg Intramuscular Given 04/04/14 1337)  metoCLOPramide (REGLAN) injection 10 mg (10 mg Intramuscular Given 04/04/14 1334)    Labs Review Labs Reviewed - No data to display  Imaging Review Dg Chest 2 View  04/04/2014   CLINICAL DATA:  Cough again patient for 1 week.  EXAM: CHEST  2 VIEW  COMPARISON:  PA and lateral chest 03/10/2014, 02/07/2014 and 09/04/2008.  FINDINGS: Heart size and mediastinal contours are within normal limits. Both lungs are clear. Visualized skeletal structures are unremarkable.  IMPRESSION: Negative exam.   Electronically Signed   By: Inge Rise M.D.   On: 04/04/2014 13:16     EKG Interpretation None      MDM   Final diagnoses:  Viral respiratory illness  Acute nonintractable headache, unspecified headache type    Filed Vitals:   04/04/14 1128  BP: 142/90  Pulse: 91  Temp: 99.2 F (37.3 C)  Resp: 16   Afebrile, NAD, non-toxic appearing, AAOx4.   1) HA: Pt HA treated and improved while in ED.  Presentation is non concerning for [facility], ICH, Meningitis, or temporal arteritis. Pt is afebrile with no focal neuro deficits, nuchal rigidity, or change in vision. Pt is to follow up with PCP to discuss prophylactic medication. Pt verbalizes understanding and is agreeable with plan to dc.   2) URI: Pt CXR negative for acute infiltrate. Patients symptoms are consistent with URI, likely viral etiology. Discussed that antibiotics are not indicated for viral infections. Pt will be discharged with symptomatic treatment.  Verbalizes understanding and is agreeable with plan.   Pt is hemodynamically stable & in NAD prior to [place], PA-C 04/04/14 1523

## 2014-04-04 NOTE — ED Notes (Addendum)
Pt reports migraine h/a which started x 1 week ago with nausea and photosensitivity.  Pt's mother reports pt vomited x 2 days ago, denies any today.  No obvious neuro deficits noted at this time.  Pt ambulatory to the room with steady gait. Pt is A&Ox 4.

## 2014-04-04 NOTE — ED Notes (Signed)
Patient transported to X-ray 

## 2014-04-04 NOTE — ED Notes (Signed)
Pt reports having a headache with light and sound sensitivity. Pt denies nausea or emesis. Pt reports a history of migraines, however pt states that her headache feels different that her other migraines. Pt also reports being seen on 03/10/14 for chest congestion and diagnosed with an upper respiratory infection, which she states she was prescribed an antibiotic and took it until completed. Pt reports decreased chest congestion, however she states that she still has some chest congestion.

## 2014-04-04 NOTE — ED Provider Notes (Signed)
Medical screening examination/treatment/procedure(s) were performed by non-physician practitioner and as supervising physician I was immediately available for consultation/collaboration.   EKG Interpretation None        Neta Ehlers, MD 04/04/14 1925

## 2014-04-04 NOTE — Discharge Instructions (Signed)
Please follow up with your primary care physician in 1-2 days. If you do not have one please call the [facility] number listed above. Please take medications as prescribed. Please read all discharge instructions and return precautions.   Upper Respiratory Infection, Adult An upper respiratory infection (URI) is also known as the common cold. It is often caused by a type of germ (virus). Colds are easily spread (contagious). You can pass it to others by kissing, coughing, sneezing, or drinking out of the same glass. Usually, you get better in 1 or 2 weeks.  HOME CARE   Only take medicine as told by your doctor.  Use a warm mist humidifier or breathe in steam from a hot shower.  Drink enough water and fluids to keep your pee (urine) clear or pale yellow.  Get plenty of rest.  Return to work when your temperature is back to normal or as told by your doctor. You may use a face mask and wash your hands to stop your cold from spreading. GET HELP RIGHT AWAY IF:   After the first few days, you feel you are getting worse.  You have questions about your medicine.  You have chills, shortness of breath, or brown or red spit (mucus).  You have yellow or brown snot (nasal discharge) or pain in the face, especially when you bend forward.  You have a fever, puffy (swollen) neck, pain when you swallow, or white spots in the back of your throat.  You have a bad headache, ear pain, sinus pain, or chest pain.  You have a high-pitched whistling sound when you breathe in and out (wheezing).  You have a lasting cough or cough up blood.  You have sore muscles or a stiff neck. MAKE SURE YOU:   Understand these instructions.  Will watch your condition.  Will get help right away if you are not doing well or get worse. Document Released: 02/15/2008 Document Revised: 11/21/2011 Document Reviewed: 12/04/2013 [facility] Patient Information 2015 [place], [place]. This information is not  intended to replace advice given to you by your health care provider. Make sure you discuss any questions you have with your health care provider.  Migraine Headache A migraine headache is an intense, throbbing pain on one or both sides of your head. A migraine can last for 30 minutes to several hours. CAUSES  The exact cause of a migraine headache is not always known. However, a migraine may be caused when nerves in the brain become irritated and release chemicals that cause inflammation. This causes pain. Certain things may also trigger migraines, such as:  Alcohol.  Smoking.  Stress.  Menstruation.  Aged cheeses.  Foods or drinks that contain nitrates, glutamate, aspartame, or tyramine.  Lack of sleep.  Chocolate.  Caffeine.  Hunger.  Physical exertion.  Fatigue.  Medicines used to treat chest pain (nitroglycerine), birth control pills, estrogen, and some blood pressure medicines. SIGNS AND SYMPTOMS  Pain on one or both sides of your head.  Pulsating or throbbing pain.  Severe pain that prevents daily activities.  Pain that is aggravated by any physical activity.  Nausea, vomiting, or both.  Dizziness.  Pain with exposure to bright lights, loud noises, or activity.  General sensitivity to bright lights, loud noises, or smells. Before you get a migraine, you may get warning signs that a migraine is coming (aura). An aura may include:  Seeing flashing lights.  Seeing bright spots, halos, or zigzag lines.  Having tunnel  vision or blurred vision.  Having feelings of numbness or tingling.  Having trouble talking.  Having muscle weakness. DIAGNOSIS  A migraine headache is often diagnosed based on:  Symptoms.  Physical exam.  A CT scan or MRI of your head. These imaging tests cannot diagnose migraines, but they can help rule out other causes of headaches. TREATMENT Medicines may be given for pain and nausea. Medicines can also be given to help  prevent recurrent migraines.  HOME CARE INSTRUCTIONS  Only take over-the-counter or prescription medicines for pain or discomfort as directed by your health care provider. The use of long-term narcotics is not recommended.  Lie down in a dark, quiet room when you have a migraine.  Keep a journal to find out what may trigger your migraine headaches. For example, write down:  What you eat and drink.  How much sleep you get.  Any change to your diet or medicines.  Limit alcohol consumption.  Quit smoking if you smoke.  Get 7-9 hours of sleep, or as recommended by your health care provider.  Limit stress.  Keep lights dim if bright lights bother you and make your migraines worse. SEEK IMMEDIATE MEDICAL CARE IF:   Your migraine becomes severe.  You have a fever.  You have a stiff neck.  You have vision loss.  You have muscular weakness or loss of muscle control.  You start losing your balance or have trouble walking.  You feel faint or pass out.  You have severe symptoms that are different from your first symptoms. MAKE SURE YOU:   Understand these instructions.  Will watch your condition.  Will get help right away if you are not doing well or get worse. Document Released: 08/29/2005 Document Revised: 01/13/2014 Document Reviewed: 05/06/2013 [facility] Patient Information 2015 [place], [place]. This information is not intended to replace advice given to you by your health care provider. Make sure you discuss any questions you have with your health care provider.

## 2014-04-11 ENCOUNTER — Emergency Department (HOSPITAL_COMMUNITY)
Admission: EM | Admit: 2014-04-11 | Discharge: 2014-04-11 | Disposition: A | Discharge status: Home / Self Care | Payer: Medicaid Other | Attending: Emergency Medicine | Admitting: Emergency Medicine

## 2014-04-11 ENCOUNTER — Encounter (HOSPITAL_COMMUNITY): Payer: Self-pay | Admitting: Emergency Medicine

## 2014-04-11 DIAGNOSIS — R51 Headache: Secondary | ICD-10-CM | POA: Insufficient documentation

## 2014-04-11 DIAGNOSIS — J3489 Other specified disorders of nose and nasal sinuses: Secondary | ICD-10-CM | POA: Insufficient documentation

## 2014-04-11 DIAGNOSIS — Z8679 Personal history of other diseases of the circulatory system: Secondary | ICD-10-CM | POA: Insufficient documentation

## 2014-04-11 DIAGNOSIS — Z79899 Other long term (current) drug therapy: Secondary | ICD-10-CM | POA: Insufficient documentation

## 2014-04-11 MED ORDER — OXYMETAZOLINE HCL 0.05 % NA SOLN: 1.0000 | Freq: Two times a day (BID) | NASAL | Status: DC

## 2014-04-11 MED ORDER — TRAMADOL HCL 50 MG PO TABS: 50.0000 mg | ORAL_TABLET | Freq: Four times a day (QID) | ORAL | Status: DC | PRN

## 2014-04-11 NOTE — ED Provider Notes (Signed)
CSN: [id number]     Arrival date & time 04/11/14  1854 History   First MD Initiated Contact with Patient 04/11/14 1907     Chief Complaint  Patient presents with  . Headache  . nasal burning      (Consider location/radiation/quality/duration/timing/severity/associated sxs/prior Treatment) HPI  Patient to the ED with complaints of nasal burning and headache. She was seen on 7/24 for the same. Had an xray done and rx albuterol and was prescribed Ocean Spray nasal. She says that the albuterol inhaler has helped but her nose still burns and she gets a bitemporal headache when after her nose burns. She has a PMH of Raynaud's disease and has had the heater blasting inside of her home. She returns to the ED because she would like further revaluation for these problems. No neck pain, fevers, weakness, coughing, swelling, n/v/d, currently no headache.   Past Medical History  Diagnosis Date  . Raynaud disease   . Raynaud's disease    Past Surgical History  Procedure Laterality Date  . Fracture surgery     Family History  Problem Relation Age of Onset  . Aneurysm Mother   . Stroke Sister    History  Substance Use Topics  . Smoking status: Never Smoker   . Smokeless tobacco: Not on file  . Alcohol Use: No   OB History   Grav Para Term Preterm Abortions TAB SAB Ect Mult Living                 Review of Systems   Review of Systems  Gen: no weight loss, fevers, chills, night sweats  Eyes: no discharge or drainage, no occular pain or visual changes  Nose: no epistaxis or rhinorrhea  +nasal burning Mouth: no dental pain, no sore throat  Neck: no neck pain  Lungs:No wheezing or hemoptysis No coughing CV:  No palpitations, dependent edema or orthopnea. No chest pain Abd: no diarrhea. No nausea or vomiting, No abdominal pain  GU: no dysuria or gross hematuria  MSK:  No muscle weakness, No  pain Neuro: + headache, no focal neurologic deficits  Skin: no rash , no wounds Psyche: no  complaints    Allergies  Review of patient's allergies indicates no known allergies.  Home Medications   Prior to Admission medications   Medication Sig Start Date End Date Taking? Authorizing Provider  acetaminophen (TYLENOL) 325 MG tablet Take 325 mg by mouth every 6 (six) hours as needed for mild pain.   Yes Historical Provider, MD  albuterol (PROVENTIL HFA;VENTOLIN HFA) 108 (90 BASE) MCG/ACT inhaler Inhale 1-2 puffs into the lungs every 6 (six) hours as needed for wheezing or shortness of breath. 04/04/14  Yes Jennifer L Piepenbrink, PA-C  sodium chloride (OCEAN) 0.65 % SOLN nasal spray Place 1 spray into both nostrils as needed for congestion. 04/04/14  Yes Jennifer L Piepenbrink, PA-C  oxymetazoline (AFRIN NASAL SPRAY) 0.05 % nasal spray Place 1 spray into both nostrils 2 (two) times daily. 04/11/14   Anayi Bricco Marilu Favre, PA-C  oxymetazoline (AFRIN) 0.05 % nasal spray Place 1 spray into both nostrils 2 (two) times daily.    Historical Provider, MD  traMADol (ULTRAM) 50 MG tablet Take 1 tablet (50 mg total) by mouth every 6 (six) hours as needed. 04/11/14   Calhoun Reichardt Marilu Favre, PA-C   BP 127/78  Pulse 92  Temp(Src) 98.3 F (36.8 C) (Oral)  Resp 18  Ht 5\' 3"  (1.6 m)  Wt 173 lb (78.472 kg)  BMI  30.65 kg/m2  SpO2 100%  LMP 03/18/2014 Physical Exam  Nursing note and vitals reviewed. Constitutional: She is oriented to person, place, and time. She appears well-developed and well-nourished. No distress.  HENT:  Head: Normocephalic and atraumatic.  Nose: Rhinorrhea present. No nasal deformity, septal deviation or nasal septal hematoma. Right sinus exhibits no maxillary sinus tenderness and no frontal sinus tenderness. Left sinus exhibits no maxillary sinus tenderness and no frontal sinus tenderness.  Eyes: Pupils are equal, round, and reactive to light.  Neck: Normal range of motion. Neck supple. No Brudzinski's sign and no Kernig's sign noted.  Cardiovascular: Normal rate and regular rhythm.    Pulmonary/Chest: Effort normal.  Abdominal: Soft.  Neurological: She is alert and oriented to person, place, and time. GCS eye subscore is 4. GCS verbal subscore is 5. GCS motor subscore is 6.  Skin: Skin is warm and dry.    ED Course  Procedures (including critical care time) Labs Review Labs Reviewed - No data to display  Imaging Review No results found.   EKG Interpretation None      MDM   Final diagnoses:  Nose dryness    Presentation is non concerning for [facility], ICH, Meningitis, or temporal arteritis. Pt is afebrile with no focal neuro deficits, nuchal rigidity, or change in vision.  The patients exam is benign with only some mild turbinate swellnig. Will Fx Afrin and Ultram. She needs to follow-up with her PCP.  18 y.o.[name] [name]'s evaluation in the Emergency Department is complete. It has been determined that no acute conditions requiring further emergency intervention are present at this time. The patient/guardian have been advised of the diagnosis and plan. We have discussed signs and symptoms that warrant return to the ED, such as changes or worsening in symptoms.  Vital signs are stable at discharge. Filed Vitals:   04/11/14 1904  BP: 127/78  Pulse: 92  Temp: 98.3 F (36.8 C)  Resp: 18    Patient/guardian has voiced understanding and agreed to follow-up with the PCP or specialist.     Linus Mako, PA-C 04/11/14 2003

## 2014-04-11 NOTE — ED Notes (Signed)
Patient reports that she was treated for headache and nasal burning on 04/04/14 and was prescribed an inhaler and saline nasal spray. Patient states the nasal spray helped a little, but she is still having a headache and nasal burning. Patient states she is also having sensitivity to light.

## 2014-04-11 NOTE — ED Provider Notes (Signed)
Medical screening examination/treatment/procedure(s) were performed by non-physician practitioner and as supervising physician I was immediately available for consultation/collaboration.   EKG Interpretation None        Orpah Greek, MD 04/11/14 2005

## 2014-04-11 NOTE — Discharge Instructions (Signed)
Raynaud's Syndrome Raynaud's Syndrome is a disorder of the blood vessels in your hands and feet. It occurs when small arteries of the arms/hands or legs/feet become sensitive to cold or emotional upset. This causes the arteries to constrict, or narrow, and reduces blood flow to the area. The color in the fingers or toes changes from white to bluish to red and this is not usually painful. There may be numbness and tingling. Sores on the skin (ulcers) can form. Symptoms are usually relieved by warming. HOME CARE INSTRUCTIONS   Avoid exposure to cold. Keep your whole body warm and dry. Dress in layers. Wear mittens or gloves when handling ice or frozen food and when outdoors. Use holders for glasses or cans containing cold drinks. If possible, stay indoors during cold weather.  Limit your use of caffeine. Switch to decaffeinated coffee, tea, and soda pop. Avoid chocolate.  Avoid smoking or being around cigarette smoke. Smoke will make symptoms worse.  Wear loose fitting socks and comfortable, roomy shoes.  Avoid vibrating tools and machinery.  If possible, avoid stressful and emotional situations. Exercise, meditation and yoga may help you cope with stress. Biofeedback may be useful.  Ask your caregiver about medicine (calcium channel blockers) that may control Raynaud's phenomena. SEEK MEDICAL CARE IF:   Your discomfort becomes worse, despite conservative treatment.  You develop sores on your fingers and toes that do not heal. Document Released: 08/26/2000 Document Revised: 11/21/2011 Document Reviewed: 09/02/2008 [facility] Patient Information 2015 [place], [place]. This information is not intended to replace advice given to you by your health care provider. Make sure you discuss any questions you have with your health care provider. Oxymetazoline nasal spray What is this medicine? Oxymetazoline (OX ee me TAZ oh leen) is a nasal decongestant. This medicine is used to treat nasal congestion or a  stuffy nose. This medicine will not treat an infection. This medicine may be used for other purposes; ask your health care provider or pharmacist if you have questions. COMMON BRAND NAME(S): 12 Hour Nasal, Afrin, Afrin Extra Moisturizing, Afrin Nasal Sinus, Dristan, Duration, Genasal, Mucinex Full Force, Mucinex Moisture Smart, Mucinex Sinus-Max, Nasal Relief, Neo-Synephrine 12-Hour, Neo-Synephrine Severe Sinus Congestion, Sinex 12-Hour, Sudafed OM Sinus Cold Moisturizing, Sudafed OM Sinus Congestion Moisturizing, Vicks Qlearquil Decongestant, Vicks Sinex, Zicam Extreme Congestion Relief, Zicam Intense Sinus What should I tell my health care provider before I take this medicine? They need to know if you have any of these conditions: -diabetes -heart disease -high blood pressure -thyroid disease -trouble urinating due to an enlarged prostate gland -an unusual or allergic reaction to oxymetazoline, other medicines, foods, dyes, or preservatives -pregnant or trying to get pregnant -breast-feeding How should I use this medicine? This medicine is for use in the nose. Do not take by mouth. Follow the directions on the package label. Shake well before using. Use your medicine at regular intervals or as directed by your health care provider. Do not use it more often than directed. Do not use for more than 3 days in a row without advice. Make sure that you are using your nasal spray correctly. Ask your doctor or health care provider if you have any questions. Talk to your pediatrician regarding the use of this medicine in children. While this drug may be prescribed for children for selected conditions, precautions do apply. Overdosage: If you think you've taken too much of this medicine contact a poison control center or emergency room at once. Overdosage: If you think you have taken too  much of this medicine contact a poison control center or emergency room at once. NOTE: This medicine is only for you. Do  not share this medicine with others. What if I miss a dose? If you miss a dose, use it as soon as you can. If it is almost time for your next dose, use only that dose. Do not use double or extra doses. What may interact with this medicine? Do not take this medicine with any of the following medications: -MAOIs like Marplan, Nardil, and Parnate This list may not describe all possible interactions. Give your health care provider a list of all the medicines, herbs, non-prescription drugs, or dietary supplements you use. Also tell them if you smoke, drink alcohol, or use illegal drugs. Some items may interact with your medicine. What should I watch for while using this medicine? Tell your doctor or healthcare professional if your symptoms do not start to get better or if they get worse. Do not share this bottle with anyone else as this may spread germs. What side effects may I notice from receiving this medicine? Side effects that you should report to your doctor or health care professional as soon as possible: -allergic reactions like skin rash, itching or hives, swelling of the face, lips, or tongue Side effects that usually do not require medical attention (Report these to your doctor or health care professional if they continue or are bothersome.): -burning, stinging, or irritation in the nose right after use -increased nasal discharge -sneezing This list may not describe all possible side effects. Call your doctor for medical advice about side effects. You may report side effects to FDA at 1-800-FDA-1088. Where should I keep my medicine? Keep out of the reach of children. Store at room temperature between 20 and 25 degrees C (68 and 77 degrees F). Throw away any unused medicine after the expiration date. NOTE: This sheet is a summary. It may not cover all possible information. If you have questions about this medicine, talk to your doctor, pharmacist, or health care provider.  2015,  [employer]vier/[employer] Standard. (2011-03-30 14:11:31)

## 2014-04-24 ENCOUNTER — Emergency Department (INDEPENDENT_AMBULATORY_CARE_PROVIDER_SITE_OTHER)
Admission: EM | Admit: 2014-04-24 | Discharge: 2014-04-24 | Disposition: A | Discharge status: Home / Self Care | Payer: Medicaid Other | Source: Home / Self Care | Attending: Emergency Medicine | Admitting: Emergency Medicine

## 2014-04-24 ENCOUNTER — Encounter (HOSPITAL_COMMUNITY): Payer: Self-pay | Admitting: Emergency Medicine

## 2014-04-24 DIAGNOSIS — R059 Cough, unspecified: Secondary | ICD-10-CM

## 2014-04-24 DIAGNOSIS — R05 Cough: Secondary | ICD-10-CM

## 2014-04-24 MED ORDER — FLUTICASONE PROPIONATE 50 MCG/ACT NA SUSP: 2.0000 | Freq: Every day | NASAL | Status: DC

## 2014-04-24 MED ORDER — METHYLPREDNISOLONE ACETATE 80 MG/ML IJ SUSP
80.0000 mg | Freq: Once | INTRAMUSCULAR | Status: AC
  Administered 2014-04-24: 80 mg via INTRAMUSCULAR

## 2014-04-24 MED ORDER — FEXOFENADINE HCL 180 MG PO TABS: 180.0000 mg | ORAL_TABLET | Freq: Every day | ORAL | Status: DC

## 2014-04-24 MED ORDER — PREDNISONE 20 MG PO TABS: ORAL_TABLET | ORAL | Status: DC

## 2014-04-24 MED ORDER — TRAMADOL HCL 50 MG PO TABS: 100.0000 mg | ORAL_TABLET | Freq: Three times a day (TID) | ORAL | Status: DC | PRN

## 2014-04-24 MED ORDER — METHYLPREDNISOLONE ACETATE 80 MG/ML IJ SUSP
INTRAMUSCULAR | Status: AC
  Filled 2014-04-24: qty 1

## 2014-04-24 NOTE — ED Provider Notes (Signed)
Chief Complaint   Chief Complaint  Patient presents with  . Cough    History of Present Illness   [name] [name] is a 18 year old female who's had a two-month history of chronic cough. The cough is mostly nonproductive and occasionally productive of small amounts of yellow sputum. There's been no blood in the sputum. She denies chest tightness wheezing or chest pain. She has had some nasal congestion, rhinorrhea, and postnasal drip. She occasionally has posttussive vomiting and has had severe headaches. She's been to the emergency room 3 times with the cough. She's gotten 2 chest x-rays both of which have been normal. She was given tramadol and Proventil. The tramadol helps her a little bit, Proventil not at all. She denies any history of asthma or allergies. She's had no sore throat or hoarseness. No reflux symptoms. She denies any exposure to anyone with TB or any other cough illness.  Review of Systems   Other than as noted above, the patient denies any of the following symptoms: Systemic:  No fevers, chills, sweats, or weight loss. ENT:  No nasal congestion, sneezing, itching, postnasal drip, sinus pressure, headache, sore throat, or hoarseness. Lungs:  No wheezing, shortness of breath, chest tightness or congestion. Heart:  No chest pain, tightness, pressure, PND, orthopnea, or ankle edema. GI:  No indigestion, heartburn, waterbrash, burping, abdominal pain, nausea, or vomiting.  [name]   Past medical history, family history, social history, meds, and allergies were reviewed.   Physical Examination     Vital signs:  BP 147/85  Pulse 94  Temp(Src) 99.2 F (37.3 C) (Oral)  Resp 16  SpO2 100%  LMP 04/12/2014 General:  Alert and oriented.  In no distress.  Skin warm and dry. ENT: TMs and ear canals normal.  Nasal mucosa normal, without drainage.  Pharynx clear without exudate or drainage.  No intraoral lesions. She has an allergic crease. Neck:  No adenopathy, tenderness or mass.   No JVD. Lungs:  No respiratory distress.  Breath sounds clear and equal bilaterally.  No wheezes, rales or rhonchi. Heart:  Regular rhythm, no gallops or murmers.  No pedal edema. Abdomon:  Soft and nontender.  No organomegaly or mass.  Course in [facility] [place]   Given Depo-Medrol 80 mg IM.  Assessment   The encounter diagnosis was Cough.  Probably due to allergies. No evidence right now of asthma, pneumonia, TB, sinusitis, or reflux esophagitis.  Plan     1.  Meds:  The following meds were prescribed:   New Prescriptions   FEXOFENADINE (ALLEGRA) 180 MG TABLET    Take 1 tablet (180 mg total) by mouth daily.   FLUTICASONE (FLONASE) 50 MCG/ACT NASAL SPRAY    Place 2 sprays into both nostrils daily.   PREDNISONE (DELTASONE) 20 MG TABLET    Take 3 daily for 5 days, 2 daily for 5 days, 1 daily for 5 days.   TRAMADOL (ULTRAM) 50 MG TABLET    Take 2 tablets (100 mg total) by mouth every 8 (eight) hours as needed.    2.  Patient Education/Counseling:  The patient was given appropriate handouts, self care instructions, and instructed in symptomatic relief.    3.  Follow up:  The patient was told to follow up here if no better in 3 to 4 days, or sooner if becoming worse in any way, and given some red flag symptoms such as difficulty breathing or chest pain which would prompt immediate return.  Followup with Dr. Donneta Romberg for allergy  evaluation.       Harden Mo, MD 04/24/14 [phone]

## 2014-04-24 NOTE — ED Notes (Addendum)
C/o sinus and cough States cough is productive with clear mucous States she has no appetite Does have severe headache  Did visit ER on 04/11/14

## 2014-06-19 ENCOUNTER — Encounter (HOSPITAL_COMMUNITY): Payer: Self-pay | Admitting: Emergency Medicine

## 2014-06-19 ENCOUNTER — Emergency Department (INDEPENDENT_AMBULATORY_CARE_PROVIDER_SITE_OTHER)
Admission: EM | Admit: 2014-06-19 | Discharge: 2014-06-19 | Disposition: A | Discharge status: Home / Self Care | Payer: Medicaid Other | Source: Home / Self Care | Attending: Family Medicine | Admitting: Family Medicine

## 2014-06-19 DIAGNOSIS — J302 Other seasonal allergic rhinitis: Secondary | ICD-10-CM

## 2014-06-19 MED ORDER — TRIAMCINOLONE ACETONIDE 40 MG/ML IJ SUSP
40.0000 mg | Freq: Once | INTRAMUSCULAR | Status: AC
  Administered 2014-06-19: 40 mg via INTRAMUSCULAR

## 2014-06-19 MED ORDER — TRIAMCINOLONE ACETONIDE 40 MG/ML IJ SUSP
INTRAMUSCULAR | Status: AC
  Filled 2014-06-19: qty 1

## 2014-06-19 MED ORDER — TRIAMCINOLONE ACETONIDE 40 MG/ML IJ SUSP: 40.0000 mg | Freq: Once | INTRAMUSCULAR | Status: DC

## 2014-06-19 MED ORDER — CETIRIZINE HCL 10 MG PO TABS: 10.0000 mg | ORAL_TABLET | Freq: Every day | ORAL | Status: DC

## 2014-06-19 MED ORDER — FLUTICASONE PROPIONATE 50 MCG/ACT NA SUSP: 1.0000 | Freq: Two times a day (BID) | NASAL | Status: DC

## 2014-06-19 NOTE — ED Notes (Signed)
C/o  Nasal pressure and pain.  Headache.  No relief with otc nasal spray.  Low grade temp.  Symptoms present x 4 days.  Denies n/v/d

## 2014-06-19 NOTE — Discharge Instructions (Signed)
Use saline nose spray, bacitracin ointment as needed, medicine as prescribed, return as needed.

## 2014-06-19 NOTE — ED Provider Notes (Signed)
CSN: [id number]     Arrival date & time 06/19/14  1931 History   First MD Initiated Contact with Patient 06/19/14 1943     Chief Complaint  Patient presents with  . Facial Pain  . Headache   (Consider location/radiation/quality/duration/timing/severity/associated sxs/prior Treatment) Patient is a 18 y.o. female presenting with headaches. The history is provided by the patient.  Headache Pain location:  Frontal Quality:  Sharp Radiates to:  Does not radiate Onset quality:  Gradual Duration:  4 days Progression:  Unchanged Chronicity:  New Similar to prior headaches: yes   Context comment:  Nasal cong, , using afrin. Relieved by:  Nothing Associated symptoms: congestion, facial pain and sinus pressure   Associated symptoms: no fever     Past Medical History  Diagnosis Date  . Raynaud disease   . Raynaud's disease    Past Surgical History  Procedure Laterality Date  . Fracture surgery     Family History  Problem Relation Age of Onset  . Aneurysm Mother   . Stroke Sister    History  Substance Use Topics  . Smoking status: Never Smoker   . Smokeless tobacco: Not on file  . Alcohol Use: No   OB History   Grav Para Term Preterm Abortions TAB SAB Ect Mult Living                 Review of Systems  Constitutional: Negative.  Negative for fever.  HENT: Positive for congestion and sinus pressure.   Neurological: Positive for headaches.    Allergies  Review of patient's allergies indicates no known allergies.  Home Medications   Prior to Admission medications   Medication Sig Start Date End Date Taking? Authorizing Provider  acetaminophen (TYLENOL) 325 MG tablet Take 325 mg by mouth every 6 (six) hours as needed for mild pain.    Historical Provider, MD  albuterol (PROVENTIL HFA;VENTOLIN HFA) 108 (90 BASE) MCG/ACT inhaler Inhale 1-2 puffs into the lungs every 6 (six) hours as needed for wheezing or shortness of breath. 04/04/14   Jennifer L Piepenbrink, PA-C   cetirizine (ZYRTEC) 10 MG tablet Take 1 tablet (10 mg total) by mouth daily. One tab daily for allergies 06/19/14   Billy Fischer, MD  fexofenadine (ALLEGRA) 180 MG tablet Take 1 tablet (180 mg total) by mouth daily. 04/24/14   Harden Mo, MD  fluticasone (FLONASE) 50 MCG/ACT nasal spray Place 2 sprays into both nostrils daily. 04/24/14   Harden Mo, MD  fluticasone (FLONASE) 50 MCG/ACT nasal spray Place 1 spray into both nostrils 2 (two) times daily. 06/19/14   Billy Fischer, MD  oxymetazoline (AFRIN NASAL SPRAY) 0.05 % nasal spray Place 1 spray into both nostrils 2 (two) times daily. 04/11/14   Tiffany Marilu Favre, PA-C  oxymetazoline (AFRIN) 0.05 % nasal spray Place 1 spray into both nostrils 2 (two) times daily.    Historical Provider, MD  predniSONE (DELTASONE) 20 MG tablet Take 3 daily for 5 days, 2 daily for 5 days, 1 daily for 5 days. 04/24/14   Harden Mo, MD  sodium chloride (OCEAN) 0.65 % SOLN nasal spray Place 1 spray into both nostrils as needed for congestion. 04/04/14   Jennifer L Piepenbrink, PA-C  traMADol (ULTRAM) 50 MG tablet Take 1 tablet (50 mg total) by mouth every 6 (six) hours as needed. 04/11/14   Tiffany Marilu Favre, PA-C  traMADol (ULTRAM) 50 MG tablet Take 2 tablets (100 mg total) by mouth every 8 (eight) hours  as needed. 04/24/14   Harden Mo, MD   BP 147/94  Pulse 108  Temp(Src) 100.6 F (38.1 C) (Oral)  Resp 16  SpO2 100%  LMP 06/05/2014 Physical Exam  Nursing note and vitals reviewed. Constitutional: She is oriented to person, place, and time. She appears well-developed and well-nourished.  HENT:  Head: Normocephalic.  Right Ear: External ear normal.  Left Ear: External ear normal.  Nose: Mucosal edema, rhinorrhea and sinus tenderness present.  Mouth/Throat: Oropharynx is clear and moist.  Eyes: Conjunctivae are normal. Pupils are equal, round, and reactive to light.  Neck: Normal range of motion. Neck supple.  Cardiovascular: Normal rate.    Pulmonary/Chest: Breath sounds normal.  Lymphadenopathy:    She has no cervical adenopathy.  Neurological: She is alert and oriented to person, place, and time.  Skin: Skin is warm and dry.    ED Course  Procedures (including critical care time) Labs Review Labs Reviewed - No data to display  Imaging Review No results found.   MDM   1. Seasonal allergic rhinitis        Billy Fischer, MD 06/20/14 1715

## 2015-01-08 ENCOUNTER — Other Ambulatory Visit: Payer: Self-pay | Admitting: Obstetrics and Gynecology

## 2015-01-08 DIAGNOSIS — N644 Mastodynia: Secondary | ICD-10-CM

## 2015-01-28 ENCOUNTER — Ambulatory Visit
Admission: RE | Admit: 2015-01-28 | Discharge: 2015-01-28 | Disposition: A | Discharge status: Home / Self Care | Payer: Medicaid Other | Source: Ambulatory Visit | Attending: Obstetrics and Gynecology | Admitting: Obstetrics and Gynecology

## 2015-01-28 ENCOUNTER — Other Ambulatory Visit: Payer: Medicaid Other

## 2015-01-28 DIAGNOSIS — N644 Mastodynia: Secondary | ICD-10-CM

## 2015-02-12 ENCOUNTER — Encounter (HOSPITAL_COMMUNITY): Payer: Self-pay | Admitting: *Deleted

## 2015-02-12 ENCOUNTER — Emergency Department (HOSPITAL_COMMUNITY)
Admission: EM | Admit: 2015-02-12 | Discharge: 2015-02-13 | Disposition: A | Discharge status: Home / Self Care | Payer: Medicaid Other | Attending: Emergency Medicine | Admitting: Emergency Medicine

## 2015-02-12 DIAGNOSIS — R05 Cough: Secondary | ICD-10-CM | POA: Insufficient documentation

## 2015-02-12 DIAGNOSIS — Z7951 Long term (current) use of inhaled steroids: Secondary | ICD-10-CM | POA: Insufficient documentation

## 2015-02-12 DIAGNOSIS — Z79899 Other long term (current) drug therapy: Secondary | ICD-10-CM | POA: Diagnosis not present

## 2015-02-12 DIAGNOSIS — R059 Cough, unspecified: Secondary | ICD-10-CM

## 2015-02-12 DIAGNOSIS — R509 Fever, unspecified: Secondary | ICD-10-CM | POA: Diagnosis not present

## 2015-02-12 DIAGNOSIS — Z8679 Personal history of other diseases of the circulatory system: Secondary | ICD-10-CM | POA: Insufficient documentation

## 2015-02-12 NOTE — ED Notes (Signed)
Pt c/o dry cough x 3 days and sore throat. States she has used chloraseptic spray.

## 2015-02-13 ENCOUNTER — Emergency Department (HOSPITAL_COMMUNITY): Payer: Medicaid Other

## 2015-02-13 MED ORDER — BENZONATATE 100 MG PO CAPS: 100.0000 mg | ORAL_CAPSULE | Freq: Three times a day (TID) | ORAL | Status: DC

## 2015-02-13 MED ORDER — ALBUTEROL SULFATE HFA 108 (90 BASE) MCG/ACT IN AERS
2.0000 | INHALATION_SPRAY | RESPIRATORY_TRACT | Status: DC | PRN
  Administered 2015-02-13: 2 via RESPIRATORY_TRACT
  Filled 2015-02-13: qty 6.7

## 2015-02-13 MED ORDER — PREDNISONE 20 MG PO TABS: ORAL_TABLET | ORAL | Status: DC

## 2015-02-13 MED ORDER — ALBUTEROL SULFATE (2.5 MG/3ML) 0.083% IN NEBU
2.5000 mg | INHALATION_SOLUTION | Freq: Once | RESPIRATORY_TRACT | Status: AC
  Administered 2015-02-13: 2.5 mg via RESPIRATORY_TRACT
  Filled 2015-02-13: qty 3

## 2015-02-13 NOTE — ED Notes (Signed)
Pt A&Ox4, ambulatory at d/c with steady gait, NAD 

## 2015-02-13 NOTE — ED Provider Notes (Signed)
CSN: [id number]     Arrival date & time 02/12/15  2343 History   First MD Initiated Contact with Patient 02/13/15 0004     Chief Complaint  Patient presents with  . Cough     (Consider location/radiation/quality/duration/timing/severity/associated sxs/prior Treatment) Patient is a 19 y.o. female presenting with cough. The history is provided by the patient. No language interpreter was used.  Cough Cough characteristics:  Non-productive Severity:  Moderate Onset quality:  Gradual Duration:  3 days Timing:  Intermittent Progression:  Worsening Chronicity:  New Smoker: no   Associated symptoms: fever     Past Medical History  Diagnosis Date  . Raynaud disease   . Raynaud's disease    Past Surgical History  Procedure Laterality Date  . Fracture surgery     Family History  Problem Relation Age of Onset  . Aneurysm Mother   . Stroke Sister    History  Substance Use Topics  . Smoking status: Never Smoker   . Smokeless tobacco: Not on file  . Alcohol Use: No   OB History    No data available     Review of Systems  Constitutional: Positive for fever.  Respiratory: Positive for cough.   All other systems reviewed and are negative.     Allergies  Review of patient's allergies indicates no known allergies.  Home Medications   Prior to Admission medications   Medication Sig Start Date End Date Taking? Authorizing Provider  acetaminophen (TYLENOL) 325 MG tablet Take 325 mg by mouth every 6 (six) hours as needed for mild pain.    Historical Provider, MD  albuterol (PROVENTIL HFA;VENTOLIN HFA) 108 (90 BASE) MCG/ACT inhaler Inhale 1-2 puffs into the lungs every 6 (six) hours as needed for wheezing or shortness of breath. 04/04/14   Baron Sane, PA-C  cetirizine (ZYRTEC) 10 MG tablet Take 1 tablet (10 mg total) by mouth daily. One tab daily for allergies 06/19/14   Billy Fischer, MD  fexofenadine (ALLEGRA) 180 MG tablet Take 1 tablet (180 mg total) by mouth daily.  04/24/14   Harden Mo, MD  fluticasone (FLONASE) 50 MCG/ACT nasal spray Place 2 sprays into both nostrils daily. 04/24/14   Harden Mo, MD  fluticasone (FLONASE) 50 MCG/ACT nasal spray Place 1 spray into both nostrils 2 (two) times daily. 06/19/14   Billy Fischer, MD  oxymetazoline (AFRIN NASAL SPRAY) 0.05 % nasal spray Place 1 spray into both nostrils 2 (two) times daily. 04/11/14   Tiffany Carlota Raspberry, PA-C  oxymetazoline (AFRIN) 0.05 % nasal spray Place 1 spray into both nostrils 2 (two) times daily.    Historical Provider, MD  predniSONE (DELTASONE) 20 MG tablet Take 3 daily for 5 days, 2 daily for 5 days, 1 daily for 5 days. 04/24/14   Harden Mo, MD  sodium chloride (OCEAN) 0.65 % SOLN nasal spray Place 1 spray into both nostrils as needed for congestion. 04/04/14   Jennifer Piepenbrink, PA-C  traMADol (ULTRAM) 50 MG tablet Take 1 tablet (50 mg total) by mouth every 6 (six) hours as needed. 04/11/14   Tiffany Carlota Raspberry, PA-C  traMADol (ULTRAM) 50 MG tablet Take 2 tablets (100 mg total) by mouth every 8 (eight) hours as needed. 04/24/14   Harden Mo, MD   BP 126/87 mmHg  Pulse 82  Temp(Src) 98.3 F (36.8 C) (Oral)  Resp 18  Ht 5\' 3"  (1.6 m)  Wt 169 lb (76.658 kg)  BMI 29.94 kg/m2  SpO2 100%  LMP 02/12/2015  Physical Exam  Constitutional: She is oriented to person, place, and time. She appears well-developed and well-nourished.  HENT:  Mouth/Throat: Posterior oropharyngeal erythema present. No oropharyngeal exudate.  Eyes: Conjunctivae are normal.  Neck: Neck supple.  Cardiovascular: Normal rate and regular rhythm.   Pulmonary/Chest: Effort normal.  Abdominal: Soft. Bowel sounds are normal.  Musculoskeletal: She exhibits no edema or tenderness.  Lymphadenopathy:    She has no cervical adenopathy.  Neurological: She is alert and oriented to person, place, and time.  Skin: Skin is warm and dry.  Psychiatric: She has a normal mood and affect.  Nursing note and vitals  reviewed.   ED Course  Procedures (including critical care time) Labs Review Labs Reviewed - No data to display  Imaging Review Dg Chest 2 View  02/13/2015   CLINICAL DATA:  Cough for 3 days.  EXAM: CHEST  2 VIEW  COMPARISON:  03/15/2014  FINDINGS: The cardiomediastinal contours are normal. The lungs are clear. Pulmonary vasculature is normal. No consolidation, pleural effusion, or pneumothorax. No acute osseous abnormalities are seen.  IMPRESSION: No acute pulmonary process.   Electronically Signed   By: Jeb Levering M.D.   On: 02/13/2015 01:24     EKG Interpretation None     Cough improved after albuterol treatment in ED. Radiology results reviewed and shared with patient.  No acute findings. MDM   Final diagnoses:  None    Cough. Albuterol MDI. Short course of steroids. Tessalon. Follow-up with PCP. Return precautions discussed.    Etta Quill, NP 02/13/15 [place], MD 02/13/15 [phone]

## 2015-02-13 NOTE — Discharge Instructions (Signed)
Cough, Adult  A cough is a reflex that helps clear your throat and airways. It can help heal the body or may be a reaction to an irritated airway. A cough may only last 2 or 3 weeks (acute) or may last more than 8 weeks (chronic).  CAUSES Acute cough:  Viral or bacterial infections. Chronic cough:  Infections.  Allergies.  Asthma.  Post-nasal drip.  Smoking.  Heartburn or acid reflux.  Some medicines.  Chronic lung problems (COPD).  Cancer. SYMPTOMS   Cough.  Fever.  Chest pain.  Increased breathing rate.  High-pitched whistling sound when breathing (wheezing).  Colored mucus that you cough up (sputum). TREATMENT   A bacterial cough may be treated with antibiotic medicine.  A viral cough must run its course and will not respond to antibiotics.  Your caregiver may recommend other treatments if you have a chronic cough. HOME CARE INSTRUCTIONS   Only take over-the-counter or prescription medicines for pain, discomfort, or fever as directed by your caregiver. Use cough suppressants only as directed by your caregiver.  Use a cold steam vaporizer or humidifier in your bedroom or home to help loosen secretions.  Sleep in a semi-upright position if your cough is worse at night.  Rest as needed.  Stop smoking if you smoke. SEEK IMMEDIATE MEDICAL CARE IF:   You have pus in your sputum.  Your cough starts to worsen.  You cannot control your cough with suppressants and are losing sleep.  You begin coughing up blood.  You have difficulty breathing.  You develop pain which is getting worse or is uncontrolled with medicine.  You have a fever. MAKE SURE YOU:   Understand these instructions.  Will watch your condition.  Will get help right away if you are not doing well or get worse. Document Released: 02/25/2011 Document Revised: 11/21/2011 Document Reviewed: 02/25/2011 Exit[employer] Patient Information 2015 [employer]. This information is not intended  to replace advice given to you by your health care provider. Make sure you discuss any questions you have with your health care provider.  

## 2015-05-14 ENCOUNTER — Emergency Department (HOSPITAL_COMMUNITY)
Admission: EM | Admit: 2015-05-14 | Discharge: 2015-05-14 | Disposition: A | Discharge status: Home / Self Care | Payer: Medicaid Other | Attending: Emergency Medicine | Admitting: Emergency Medicine

## 2015-05-14 ENCOUNTER — Encounter (HOSPITAL_COMMUNITY): Payer: Self-pay | Admitting: Emergency Medicine

## 2015-05-14 DIAGNOSIS — Z8679 Personal history of other diseases of the circulatory system: Secondary | ICD-10-CM | POA: Diagnosis not present

## 2015-05-14 DIAGNOSIS — H9202 Otalgia, left ear: Secondary | ICD-10-CM | POA: Diagnosis not present

## 2015-05-14 DIAGNOSIS — Z7952 Long term (current) use of systemic steroids: Secondary | ICD-10-CM | POA: Insufficient documentation

## 2015-05-14 DIAGNOSIS — Z79899 Other long term (current) drug therapy: Secondary | ICD-10-CM | POA: Diagnosis not present

## 2015-05-14 DIAGNOSIS — J310 Chronic rhinitis: Secondary | ICD-10-CM | POA: Insufficient documentation

## 2015-05-14 DIAGNOSIS — R0981 Nasal congestion: Secondary | ICD-10-CM | POA: Diagnosis present

## 2015-05-14 MED ORDER — BUTAMBEN-TETRACAINE-BENZOCAINE 2-2-14 % EX AERO: 1.0000 | INHALATION_SPRAY | CUTANEOUS | Status: DC | PRN

## 2015-05-14 MED ORDER — IBUPROFEN 600 MG PO TABS
600.0000 mg | ORAL_TABLET | Freq: Four times a day (QID) | ORAL | Status: DC | PRN
Start: 2015-05-14 — End: 2015-12-15

## 2015-05-14 MED ORDER — SALINE SPRAY 0.65 % NA SOLN: 1.0000 | NASAL | Status: DC | PRN

## 2015-05-14 NOTE — ED Provider Notes (Signed)
CSN: [id number]     Arrival date & time 05/14/15  1201 History  This chart was scribed for non-physician practitioner Clayton Bibles, PA-C, working with Lacretia Leigh, MD, by Eustaquio Maize, ED Scribe. This patient was seen in room TR11C/TR11C and the patient's care was started at 12:34 PM.  Chief Complaint  Patient presents with  . Nasal Congestion   The history is provided by the patient. No language interpreter was used.     HPI Comments: [name] [name] is a 19 y.o. female who presents to the Emergency Department complaining of left naris pain x 3 days. She states that she has pain when she breathes through her nose. Pt also complains of left ear pain. She denies blowing her nose or epistaxis. Also denies placing any foreign objects up the nose. Pt has been using Flonase but states it has only been drying her nose out. She mentions using Flonase for 1-2 years now for allergy relief but states she believes she got a sinus infection from the Flonase in the past. Pt has been applying Vaseline to the inside of her nose due to the dryness. Denies fever, chills, myalgias, sore throat, cough, dental pain, or any other associated symptoms.    Past Medical History  Diagnosis Date  . Raynaud disease   . Raynaud's disease    Past Surgical History  Procedure Laterality Date  . Fracture surgery     Family History  Problem Relation Age of Onset  . Aneurysm Mother   . Stroke Sister    Social History  Substance Use Topics  . Smoking status: Never Smoker   . Smokeless tobacco: None  . Alcohol Use: No   OB History    No data available     Review of Systems  Constitutional: Negative for fever and chills.  HENT: Positive for ear pain (Left). Negative for congestion, dental problem, mouth sores, nosebleeds, rhinorrhea, sore throat and trouble swallowing.        Left naris pain  Respiratory: Negative for cough and shortness of breath.   Musculoskeletal: Negative for myalgias, neck pain and neck  stiffness.  Skin: Negative for wound.  Allergic/Immunologic: Positive for environmental allergies. Negative for immunocompromised state.  Hematological: Does not bruise/bleed easily.  Psychiatric/Behavioral: Negative for self-injury.   Allergies  Review of patient's allergies indicates no known allergies.  Home Medications   Prior to Admission medications   Medication Sig Start Date End Date Taking? Authorizing Provider  acetaminophen (TYLENOL) 325 MG tablet Take 325 mg by mouth every 6 (six) hours as needed for mild pain.    Historical Provider, MD  albuterol (PROVENTIL HFA;VENTOLIN HFA) 108 (90 BASE) MCG/ACT inhaler Inhale 1-2 puffs into the lungs every 6 (six) hours as needed for wheezing or shortness of breath. 04/04/14   Baron Sane, PA-C  benzonatate (TESSALON) 100 MG capsule Take 1 capsule (100 mg total) by mouth every 8 (eight) hours. 02/13/15   Etta Quill, NP  cetirizine (ZYRTEC) 10 MG tablet Take 1 tablet (10 mg total) by mouth daily. One tab daily for allergies 06/19/14   Billy Fischer, MD  fexofenadine (ALLEGRA) 180 MG tablet Take 1 tablet (180 mg total) by mouth daily. 04/24/14   Harden Mo, MD  fluticasone (FLONASE) 50 MCG/ACT nasal spray Place 2 sprays into both nostrils daily. 04/24/14   Harden Mo, MD  fluticasone (FLONASE) 50 MCG/ACT nasal spray Place 1 spray into both nostrils 2 (two) times daily. 06/19/14   Billy Fischer, MD  oxymetazoline (AFRIN NASAL SPRAY) 0.05 % nasal spray Place 1 spray into both nostrils 2 (two) times daily. 04/11/14   Tiffany Carlota Raspberry, PA-C  oxymetazoline (AFRIN) 0.05 % nasal spray Place 1 spray into both nostrils 2 (two) times daily.    Historical Provider, MD  predniSONE (DELTASONE) 20 MG tablet 3 tabs po day one, then 2 tabs daily x 4 days 02/13/15   Etta Quill, NP  sodium chloride (OCEAN) 0.65 % SOLN nasal spray Place 1 spray into both nostrils as needed for congestion. 04/04/14   Jennifer Piepenbrink, PA-C  traMADol (ULTRAM) 50 MG tablet  Take 1 tablet (50 mg total) by mouth every 6 (six) hours as needed. 04/11/14   Tiffany Carlota Raspberry, PA-C  traMADol (ULTRAM) 50 MG tablet Take 2 tablets (100 mg total) by mouth every 8 (eight) hours as needed. 04/24/14   Harden Mo, MD   Triage Vitals: BP 125/82 mmHg  Pulse 70  Temp(Src) 98.2 F (36.8 C) (Oral)  Resp 16  SpO2 100%   Physical Exam  Constitutional: She appears well-developed and well-nourished. No distress.  HENT:  Head: Normocephalic and atraumatic.  Right Ear: Tympanic membrane and ear canal normal.  Left Ear: Tympanic membrane and ear canal normal.  Nose: Sinus tenderness present. No rhinorrhea, nose lacerations, nasal deformity or septal deviation. No epistaxis.  No foreign bodies.  Mouth/Throat: Uvula is midline and oropharynx is clear and moist. Mucous membranes are not pale and not cyanotic. No oropharyngeal exudate, posterior oropharyngeal edema or posterior oropharyngeal erythema.  Mild tenderness of the left naris with otoscopic examination  No tenderness to percussion of the maxillary or frontal sinuses  Eyes: Conjunctivae and EOM are normal. Right eye exhibits no discharge. Left eye exhibits no discharge.  Neck: Normal range of motion. Neck supple.  Cardiovascular: Normal rate and regular rhythm.   Pulmonary/Chest: Effort normal and breath sounds normal. No respiratory distress. She has no wheezes. She has no rales.  Neurological: She is alert.  Skin: No rash noted. She is not diaphoretic.  Nursing note and vitals reviewed.   ED Course  Procedures (including critical care time)  DIAGNOSTIC STUDIES: Oxygen Saturation is 100% on RA, normal by my interpretation.    COORDINATION OF CARE: 12:39 PM-Discussed treatment plan with pt at bedside and pt agreed to plan.   Labs Review Labs Reviewed - No data to display  Imaging Review No results found. I have personally reviewed and evaluated these images and lab results as part of my medical decision-making.    EKG Interpretation None      MDM   Final diagnoses:  Rhinitis, non-allergic    Afebrile, nontoxic patient with pain in the left nare.  Exam is unremarkble.  Pt has been using flonase daily.  Denies putting anything else in her nose.  No URI symptoms.  No sinus pressure or tenderness.  Doubt sinusitis.  Suspect mild nasal mucosal irritation. D/C home with ibuprofen, saline nasal spray, PCP follow up.   Discussed result, findings, treatment, and follow up  with patient.  Pt given return precautions.  Pt verbalizes understanding and agrees with plan.        I personally performed the services described in this documentation, which was scribed in my presence. The recorded information has been reviewed and is accurate.      Clayton Bibles, PA-C 05/14/15 1422  Lacretia Leigh, MD 05/15/15 [phone]

## 2015-05-14 NOTE — ED Notes (Signed)
Patient complains of nasal congestion, but states can't blow anything out.   Patient states "it makes my head and nose hurt".   Denies other symptoms.

## 2015-05-14 NOTE — Discharge Instructions (Signed)
Read the information below.  Use the prescribed medication as directed.  Please discuss all new medications with your pharmacist.  You may return to the Emergency Department at any time for worsening condition or any new symptoms that concern you.   If you develop fevers, severe facial pain, uncontrolled nosebleeds, return to the ER for a recheck.

## 2015-08-11 ENCOUNTER — Ambulatory Visit: Payer: Medicaid Other | Attending: Family Medicine | Admitting: Family Medicine

## 2015-08-11 ENCOUNTER — Encounter: Payer: Self-pay | Admitting: Family Medicine

## 2015-08-11 VITALS — BP 131/91 | HR 84 | Temp 98.6°F | Resp 16 | Ht 62.0 in | Wt 164.0 lb

## 2015-08-11 DIAGNOSIS — Z3042 Encounter for surveillance of injectable contraceptive: Secondary | ICD-10-CM | POA: Insufficient documentation

## 2015-08-11 DIAGNOSIS — I7300 Raynaud's syndrome without gangrene: Secondary | ICD-10-CM | POA: Diagnosis not present

## 2015-08-11 DIAGNOSIS — Z0000 Encounter for general adult medical examination without abnormal findings: Secondary | ICD-10-CM | POA: Insufficient documentation

## 2015-08-11 DIAGNOSIS — J302 Other seasonal allergic rhinitis: Secondary | ICD-10-CM | POA: Insufficient documentation

## 2015-08-11 DIAGNOSIS — D649 Anemia, unspecified: Secondary | ICD-10-CM | POA: Insufficient documentation

## 2015-08-11 DIAGNOSIS — R200 Anesthesia of skin: Secondary | ICD-10-CM | POA: Insufficient documentation

## 2015-08-11 DIAGNOSIS — K5900 Constipation, unspecified: Secondary | ICD-10-CM | POA: Insufficient documentation

## 2015-08-11 DIAGNOSIS — D509 Iron deficiency anemia, unspecified: Secondary | ICD-10-CM

## 2015-08-11 DIAGNOSIS — R040 Epistaxis: Secondary | ICD-10-CM | POA: Insufficient documentation

## 2015-08-11 DIAGNOSIS — Z79899 Other long term (current) drug therapy: Secondary | ICD-10-CM | POA: Insufficient documentation

## 2015-08-11 HISTORY — DX: Other seasonal allergic rhinitis: J30.2

## 2015-08-11 HISTORY — DX: Iron deficiency anemia, unspecified: D50.9

## 2015-08-11 HISTORY — DX: Encounter for surveillance of injectable contraceptive: Z30.42

## 2015-08-11 HISTORY — DX: Raynaud's syndrome without gangrene: I73.00

## 2015-08-11 LAB — COMPLETE METABOLIC PANEL WITH GFR
ALBUMIN: 3.8 g/dL (ref 3.6–5.1)
ALK PHOS: 80 U/L (ref 33–115)
ALT: 18 U/L (ref 6–29)
AST: 18 U/L (ref 10–30)
BUN: 9 mg/dL (ref 7–25)
CO2: 24 mmol/L (ref 20–31)
Calcium: 9.1 mg/dL (ref 8.6–10.2)
Chloride: 103 mmol/L (ref 98–110)
Creat: 0.62 mg/dL (ref 0.50–1.10)
Glucose, Bld: 77 mg/dL (ref 65–99)
Potassium: 4.5 mmol/L (ref 3.5–5.3)
SODIUM: 135 mmol/L (ref 135–146)
TOTAL PROTEIN: 7.4 g/dL (ref 6.1–8.1)
Total Bilirubin: 0.5 mg/dL (ref 0.2–1.2)

## 2015-08-11 LAB — CBC
HEMATOCRIT: 37.3 % (ref 36.0–46.0)
HEMOGLOBIN: 11.6 g/dL — AB (ref 12.0–15.0)
MCH: 22.4 pg — AB (ref 26.0–34.0)
MCHC: 31.1 g/dL (ref 30.0–36.0)
MCV: 72 fL — ABNORMAL LOW (ref 78.0–100.0)
Platelets: 301 10*3/uL (ref 150–400)
RBC: 5.18 MIL/uL — ABNORMAL HIGH (ref 3.87–5.11)
RDW: 20.4 % — ABNORMAL HIGH (ref 11.5–15.5)
WBC: 8.6 10*3/uL (ref 4.0–10.5)

## 2015-08-11 LAB — IRON AND TIBC
%SAT: 8 % — ABNORMAL LOW (ref 11–50)
Iron: 31 ug/dL — ABNORMAL LOW (ref 40–190)
TIBC: 367 ug/dL (ref 250–450)
UIBC: 336 ug/dL (ref 125–400)

## 2015-08-11 LAB — POCT URINE PREGNANCY: Preg Test, Ur: NEGATIVE

## 2015-08-11 LAB — FERRITIN: Ferritin: 5 ng/mL — ABNORMAL LOW (ref 10–291)

## 2015-08-11 MED ORDER — CETIRIZINE HCL 10 MG PO TABS: 10.0000 mg | ORAL_TABLET | Freq: Every day | ORAL | Status: DC | PRN

## 2015-08-11 MED ORDER — AMLODIPINE BESYLATE 5 MG PO TABS: 5.0000 mg | ORAL_TABLET | Freq: Every day | ORAL | Status: DC

## 2015-08-11 MED ORDER — FLUTICASONE PROPIONATE 50 MCG/ACT NA SUSP: 2.0000 | Freq: Every day | NASAL | Status: DC

## 2015-08-11 MED ORDER — MEDROXYPROGESTERONE ACETATE 150 MG/ML IM SUSP
150.0000 mg | Freq: Once | INTRAMUSCULAR | Status: AC
  Administered 2015-08-11: 150 mg via INTRAMUSCULAR

## 2015-08-11 MED ORDER — CYCLOBENZAPRINE HCL 10 MG PO TABS: 10.0000 mg | ORAL_TABLET | Freq: Every day | ORAL | Status: DC

## 2015-08-11 NOTE — Progress Notes (Signed)
Establish care  Requesting Depo provera Inj No tobacco user  No suicide thought in the past two weeks

## 2015-08-11 NOTE — Progress Notes (Signed)
Subjective:  Patient ID: [name] [name], female    DOB: Aug 20, 1996  Age: 19 y.o. MRN: [id number]  CC: Establish Care and Contraception   HPI [name] [name] presents for   1. Contraception: interested in depo shot. She is currently on her period. Been on depo for 2 years.   2. Raynaud's phenomenon: x 6 years. Fingers and toes turn purple when cold. Has numbness. Relieved with warming. No ulcers. Has been treated with muscle relaxer in past per report. No hx of CCB treatment.   3. Seasonal allergies: dry nose. Has nose bleeds. No sneezing, coughing or runny nose. Using zyrtec, flonase and nasal saline.   4. Anemia: has hx of anemia. Previously on iron. Stopped iron due to constipation and lack of improvement in Hgb. No fatigue. Cold extremities. No syncope.   Past Medical History  Diagnosis Date  . Raynaud disease   . Raynaud's disease     Past Surgical History  Procedure Laterality Date  . Fracture surgery      Family History  Problem Relation Age of Onset  . Aneurysm Mother   . Stroke Sister     Social History  Substance Use Topics  . Smoking status: Never Smoker   . Smokeless tobacco: Not on file  . Alcohol Use: No    ROS Review of Systems  Constitutional: Negative for fever and chills.  HENT: Positive for nosebleeds.   Eyes: Negative for visual disturbance.  Respiratory: Negative for shortness of breath.   Cardiovascular: Negative for chest pain.  Gastrointestinal: Negative for abdominal pain and blood in stool.  Endocrine: Positive for cold intolerance.  Musculoskeletal: Negative for back pain and arthralgias.  Skin: Positive for color change. Negative for rash.  Allergic/Immunologic: Negative for immunocompromised state.  Neurological: Positive for numbness.  Hematological: Negative for adenopathy. Does not bruise/bleed easily.  Psychiatric/Behavioral: Negative for suicidal ideas and dysphoric mood.    Objective:   Today's Vitals: BP 131/91 mmHg  Pulse  84  Temp(Src) 98.6 F (37 C) (Oral)  Resp 16  Ht 5\' 2"  (1.575 m)  Wt 164 lb (74.39 kg)  BMI 29.99 kg/m2  SpO2 100%  LMP 07/25/2015  Physical Exam  Constitutional: She is oriented to person, place, and time. She appears well-developed and well-nourished. No distress.  HENT:  Head: Normocephalic and atraumatic.  Nose: Sinus tenderness present.  Cardiovascular: Normal rate, regular rhythm, normal heart sounds and intact distal pulses.   Pulmonary/Chest: Effort normal and breath sounds normal.  Musculoskeletal: She exhibits no edema.  Hypertrophy of distal fingers   Neurological: She is alert and oriented to person, place, and time.  Skin: Skin is warm and dry. No rash noted.  Psychiatric: She has a normal mood and affect.   Assessment & Plan:   [name] [name] was seen today for establish care and contraception.  Diagnoses and all orders for this visit:  Raynaud's disease -     amLODipine (NORVASC) 5 MG tablet; Take 1 tablet (5 mg total) by mouth daily. -     cyclobenzaprine (FLEXERIL) 10 MG tablet; Take 1 tablet (10 mg total) by mouth at bedtime. -     COMPLETE METABOLIC PANEL WITH GFR  Depo-Provera contraceptive status -     POCT urine pregnancy -     medroxyPROGESTERone (DEPO-PROVERA) injection 150 mg; Inject 1 mL (150 mg total) into the muscle once.  Seasonal allergies -     Discontinue: cetirizine (ZYRTEC) 10 MG tablet; Take 1 tablet (10 mg total) by mouth  daily as needed for allergies. One tab daily for allergies -     cetirizine (ZYRTEC) 10 MG tablet; Take 1 tablet (10 mg total) by mouth daily as needed for allergies. One tab daily for allergies -     fluticasone (FLONASE) 50 MCG/ACT nasal spray; Place 2 sprays into both nostrils daily.  Anemia, unspecified anemia type -     CBC -     Iron and TIBC -     Ferritin  Healthcare maintenance -     Flu Vaccine QUAD 36+ mos IM    Outpatient Encounter Prescriptions as of 08/11/2015  Medication Sig  . acetaminophen (TYLENOL)  325 MG tablet Take 325 mg by mouth every 6 (six) hours as needed for mild pain.  Marland Kitchen albuterol (PROVENTIL HFA;VENTOLIN HFA) 108 (90 BASE) MCG/ACT inhaler Inhale 1-2 puffs into the lungs every 6 (six) hours as needed for wheezing or shortness of breath.  . cetirizine (ZYRTEC) 10 MG tablet Take 1 tablet (10 mg total) by mouth daily. One tab daily for allergies  . fexofenadine (ALLEGRA) 180 MG tablet Take 1 tablet (180 mg total) by mouth daily.  . fluticasone (FLONASE) 50 MCG/ACT nasal spray Place 2 sprays into both nostrils daily.  Marland Kitchen ibuprofen (ADVIL,MOTRIN) 600 MG tablet Take 1 tablet (600 mg total) by mouth every 6 (six) hours as needed for mild pain or moderate pain.  . sodium chloride (OCEAN) 0.65 % SOLN nasal spray Place 1 spray into both nostrils as needed for congestion.  . [DISCONTINUED] benzonatate (TESSALON) 100 MG capsule Take 1 capsule (100 mg total) by mouth every 8 (eight) hours. (Patient not taking: Reported on 08/11/2015)  . [DISCONTINUED] fluticasone (FLONASE) 50 MCG/ACT nasal spray Place 1 spray into both nostrils 2 (two) times daily. (Patient not taking: Reported on 08/11/2015)  . [DISCONTINUED] predniSONE (DELTASONE) 20 MG tablet 3 tabs po day one, then 2 tabs daily x 4 days (Patient not taking: Reported on 08/11/2015)  . [DISCONTINUED] traMADol (ULTRAM) 50 MG tablet Take 1 tablet (50 mg total) by mouth every 6 (six) hours as needed. (Patient not taking: Reported on 08/11/2015)  . [DISCONTINUED] traMADol (ULTRAM) 50 MG tablet Take 2 tablets (100 mg total) by mouth every 8 (eight) hours as needed. (Patient not taking: Reported on 08/11/2015)   No facility-administered encounter medications on file as of 08/11/2015.    Follow-up: No Follow-up on file.    Boykin Nearing MD

## 2015-08-11 NOTE — Patient Instructions (Addendum)
[name] [name] was seen today for establish care and contraception.  Diagnoses and all orders for this visit:  Raynaud's disease -     amLODipine (NORVASC) 5 MG tablet; Take 1 tablet (5 mg total) by mouth daily. -     cyclobenzaprine (FLEXERIL) 10 MG tablet; Take 1 tablet (10 mg total) by mouth at bedtime. -     COMPLETE METABOLIC PANEL WITH GFR  Depo-Provera contraceptive status -     POCT urine pregnancy -     medroxyPROGESTERone (DEPO-PROVERA) injection 150 mg; Inject 1 mL (150 mg total) into the muscle once.  Seasonal allergies -     Discontinue: cetirizine (ZYRTEC) 10 MG tablet; Take 1 tablet (10 mg total) by mouth daily as needed for allergies. One tab daily for allergies -     cetirizine (ZYRTEC) 10 MG tablet; Take 1 tablet (10 mg total) by mouth daily as needed for allergies. One tab daily for allergies -     fluticasone (FLONASE) 50 MCG/ACT nasal spray; Place 2 sprays into both nostrils daily.  Anemia, unspecified anemia type -     CBC -     Iron and TIBC -     Ferritin   F/u in 4 weeks for pharmacy BP check F/u with me in 3 months  Dr. Adrian Blackwater   Raynaud Phenomenon Raynaud phenomenon is a condition that affects the blood vessels (arteries) that carry blood to your fingers and toes. The arteries that supply blood to your ears or the tip of your nose might also be affected. Raynaud phenomenon causes the arteries to temporarily narrow. As a result, the flow of blood to the affected areas is temporarily decreased. This usually occurs in response to cold temperatures or stress. During an attack, the skin in the affected areas turns white. You may also feel tingling or numbness in those areas. Attacks usually last for only a brief period, and then the blood flow to the area returns to normal. In most cases, Raynaud phenomenon does not cause serious health problems. CAUSES  For many people with this condition, the cause is not known. Raynaud phenomenon is sometimes associated with other  diseases, such as scleroderma or lupus. RISK FACTORS Raynaud phenomenon can affect anyone, but it develops most often in people who are 75-50 years old. It affects more females than males. SIGNS AND SYMPTOMS Symptoms of Raynaud phenomenon may occur when you are exposed to cold temperatures or when you have emotional stress. The symptoms may last for a few minutes or up to several hours. They usually affect your fingers but may also affect your toes, ears, or the tip of your nose. Symptoms may include:  Changes in skin color. The skin in the affected areas will turn pale or white. The skin may then change from white to bluish to red as normal blood flow returns to the area.  Numbness, tingling, or pain in the affected areas. In severe cases, sores may develop in the affected areas.  DIAGNOSIS  Your health care provider will do a physical exam and take your medical history. You may be asked to put your hands in cold water to check for a reaction to cold temperature. Blood tests may be done to check for other diseases or conditions. Your health care provider may also order a test to check the movement of blood through your arteries and veins (vascular ultrasound). TREATMENT  Treatment often involves making lifestyle changes and taking steps to control your exposure to cold temperatures. For more severe  cases, medicine (calcium channel blockers) may be used to improve blood flow. Surgery is sometimes done to block the nerves that control the affected arteries, but this is rare. HOME CARE INSTRUCTIONS   Avoid exposure to cold by taking these steps:  If possible, stay indoors during cold weather.  When you go outside during cold weather, dress in layers and wear mittens, a hat, a scarf, and warm footwear.  Wear mittens or gloves when handling ice or frozen food.  Use holders for glasses or cans containing cold drinks.  Let warm water run for a while before taking a shower or bath.  Warm up the  car before driving in cold weather.  If possible, avoid stressful and emotional situations. Exercise, meditation, and yoga may help you cope with stress. Biofeedback may be useful.  Do not use any tobacco products, including cigarettes, chewing tobacco, or electronic cigarettes. If you need help quitting, ask your health care provider.  Avoid secondhand smoke.  Limit your use of caffeine. Switch to decaffeinated coffee, tea, and soda. Avoid chocolate.  Wear loose fitting socks and comfortable, roomy shoes.  Avoid vibrating tools and machinery.  Take medicines only as directed by your health care provider. SEEK MEDICAL CARE IF:   Your discomfort becomes worse despite lifestyle changes.  You develop sores on your fingers or toes that do not heal.  Your fingers or toes turn black.  You have breaks in the skin on your fingers or toes.  You have a fever.  You have pain or swelling in your joints.  You have a rash.  Your symptoms occur on only one side of your body.   This information is not intended to replace advice given to you by your health care provider. Make sure you discuss any questions you have with your health care provider.   Document Released: 08/26/2000 Document Revised: 09/19/2014 Document Reviewed: 04/03/2014 Elsevier Interactive Patient Education [employer].   Return Feb 14-Feb 28 for Depo provera Injection

## 2015-08-12 MED ORDER — FERROUS SULFATE 325 (65 FE) MG PO TABS: 325.0000 mg | ORAL_TABLET | Freq: Two times a day (BID) | ORAL | Status: DC

## 2015-08-12 NOTE — Addendum Note (Signed)
Addended by: Boykin Nearing on: 08/12/2015 09:04 AM   Modules accepted: Orders

## 2015-08-24 ENCOUNTER — Telehealth: Payer: Self-pay | Admitting: *Deleted

## 2015-08-24 NOTE — Telephone Encounter (Signed)
LVM to return call.

## 2015-08-24 NOTE — Telephone Encounter (Signed)
-----   Message from Boykin Nearing, MD sent at 08/12/2015  9:03 AM EST ----- Slight anemia with Hgb of 11.6 Iron stores are low Normal CMP Advise iron supplement with vit C tablet or 1/2 glass of orange juice to improve absorption

## 2015-09-01 ENCOUNTER — Encounter: Payer: Medicaid Other | Admitting: Pharmacist

## 2015-10-18 ENCOUNTER — Encounter (HOSPITAL_COMMUNITY): Payer: Self-pay | Admitting: *Deleted

## 2015-10-18 ENCOUNTER — Emergency Department (INDEPENDENT_AMBULATORY_CARE_PROVIDER_SITE_OTHER): Payer: Self-pay

## 2015-10-18 ENCOUNTER — Emergency Department (INDEPENDENT_AMBULATORY_CARE_PROVIDER_SITE_OTHER)
Admission: EM | Admit: 2015-10-18 | Discharge: 2015-10-18 | Disposition: A | Discharge status: Home / Self Care | Payer: Medicaid Other | Source: Home / Self Care | Attending: Family Medicine | Admitting: Family Medicine

## 2015-10-18 DIAGNOSIS — M7651 Patellar tendinitis, right knee: Secondary | ICD-10-CM

## 2015-10-18 MED ORDER — DICLOFENAC POTASSIUM 50 MG PO TABS: 50.0000 mg | ORAL_TABLET | Freq: Three times a day (TID) | ORAL | Status: DC

## 2015-10-18 NOTE — ED Provider Notes (Signed)
CSN: [id number]     Arrival date & time 10/18/15  1613 History   First MD Initiated Contact with Patient 10/18/15 1624     Chief Complaint  Patient presents with  . Knee Pain   (Consider location/radiation/quality/duration/timing/severity/associated sxs/prior Treatment) Patient is a 20 y.o. female presenting with knee pain. The history is provided by the patient.  Knee Pain Location:  Knee Time since incident:  2 days Injury: no   Knee location:  R knee Pain details:    Quality:  Sharp   Severity:  Mild   Onset quality:  Gradual   Progression:  Unchanged Chronicity:  New Dislocation: no   Foreign body present:  No foreign bodies Associated symptoms: no decreased ROM, no stiffness, no swelling and no tingling     Past Medical History  Diagnosis Date  . Raynaud disease    Past Surgical History  Procedure Laterality Date  . Fracture surgery     Family History  Problem Relation Age of Onset  . Aneurysm Mother   . Stroke Sister    Social History  Substance Use Topics  . Smoking status: Never Smoker   . Smokeless tobacco: None  . Alcohol Use: No   OB History    No data available     Review of Systems  Constitutional: Negative.   Gastrointestinal: Negative.   Musculoskeletal: Positive for gait problem. Negative for myalgias, joint swelling and stiffness.  Skin: Negative.   All other systems reviewed and are negative.   Allergies  Review of patient's allergies indicates no known allergies.  Home Medications   Prior to Admission medications   Medication Sig Start Date End Date Taking? Authorizing Provider  ferrous sulfate 325 (65 FE) MG tablet Take 1 tablet (325 mg total) by mouth 2 (two) times daily. With 1/2 glass of orange juice 08/12/15  Yes Josalyn Funches, MD  acetaminophen (TYLENOL) 325 MG tablet Take 325 mg by mouth every 6 (six) hours as needed for mild pain.    Historical Provider, MD  albuterol (PROVENTIL HFA;VENTOLIN HFA) 108 (90 BASE) MCG/ACT  inhaler Inhale 1-2 puffs into the lungs every 6 (six) hours as needed for wheezing or shortness of breath. 04/04/14   Baron Sane, PA-C  amLODipine (NORVASC) 5 MG tablet Take 1 tablet (5 mg total) by mouth daily. 08/11/15   Josalyn Funches, MD  cetirizine (ZYRTEC) 10 MG tablet Take 1 tablet (10 mg total) by mouth daily as needed for allergies. One tab daily for allergies 08/11/15   Boykin Nearing, MD  cyclobenzaprine (FLEXERIL) 10 MG tablet Take 1 tablet (10 mg total) by mouth at bedtime. 08/11/15   Boykin Nearing, MD  diclofenac (CATAFLAM) 50 MG tablet Take 1 tablet (50 mg total) by mouth 3 (three) times daily. For knee pain 10/18/15   Billy Fischer, MD  fluticasone [facility]) 50 MCG/ACT nasal spray Place 2 sprays into both nostrils daily. 08/11/15   Josalyn Funches, MD  ibuprofen (ADVIL,MOTRIN) 600 MG tablet Take 1 tablet (600 mg total) by mouth every 6 (six) hours as needed for mild pain or moderate pain. 05/14/15   Clayton Bibles, PA-C  sodium chloride (OCEAN) 0.65 % SOLN nasal spray Place 1 spray into both nostrils as needed for congestion. 05/14/15   Clayton Bibles, PA-C   Meds Ordered and Administered this Visit  Medications - No data to display  BP 138/92 mmHg  Pulse 95  Temp(Src) 98.4 F (36.9 C) (Oral)  SpO2 98% No data found.   Physical Exam  Constitutional: She is oriented to person, place, and time. She appears well-nourished.  Musculoskeletal: Normal range of motion. She exhibits tenderness. She exhibits no edema.       Right knee: She exhibits abnormal patellar mobility. She exhibits normal range of motion, no swelling, no effusion, no ecchymosis, no deformity, no erythema, normal alignment, no LCL laxity, no bony tenderness and normal meniscus. No tenderness found. No medial joint line, no lateral joint line, no MCL and no LCL tenderness noted.       Legs: Neurological: She is alert and oriented to person, place, and time.  Skin: Skin is warm and dry.  Nursing note and vitals  reviewed.   ED Course  Procedures (including critical care time)  Labs Review Labs Reviewed - No data to display  Imaging Review Dg Knee Complete 4 Views Right  10/18/2015  CLINICAL DATA:  Initial evaluation for acute knee pain.  No injury. EXAM: RIGHT KNEE - COMPLETE 4+ VIEW COMPARISON:  None. FINDINGS: There is no evidence of fracture, dislocation, or joint effusion. There is no evidence of arthropathy or other focal bone abnormality. Soft tissues are unremarkable. IMPRESSION: No acute abnormality about the right knee. Electronically Signed   By: Jeannine Boga M.D.   On: 10/18/2015 16:55     Visual Acuity Review  Right Eye Distance:   Left Eye Distance:   Bilateral Distance:    Right Eye Near:   Left Eye Near:    Bilateral Near:         MDM   1. Patellar tendonitis of right knee    Meds ordered this encounter  Medications  . diclofenac (CATAFLAM) 50 MG tablet    Sig: Take 1 tablet (50 mg total) by mouth 3 (three) times daily. For knee pain    Dispense:  30 tablet    Refill:  0       Billy Fischer, MD 10/18/15 1710

## 2015-10-18 NOTE — ED Notes (Signed)
Started with slight right knee pain 2 days ago without any injury or unusual activity.  Yesterday started becoming more painful and swollen.

## 2015-10-25 ENCOUNTER — Encounter (HOSPITAL_COMMUNITY): Payer: Self-pay | Admitting: *Deleted

## 2015-10-25 ENCOUNTER — Emergency Department (INDEPENDENT_AMBULATORY_CARE_PROVIDER_SITE_OTHER)
Admission: EM | Admit: 2015-10-25 | Discharge: 2015-10-25 | Disposition: A | Discharge status: Home / Self Care | Payer: Self-pay | Source: Home / Self Care | Attending: Family Medicine | Admitting: Family Medicine

## 2015-10-25 DIAGNOSIS — M76892 Other specified enthesopathies of left lower limb, excluding foot: Secondary | ICD-10-CM

## 2015-10-25 DIAGNOSIS — M6580 Other synovitis and tenosynovitis, unspecified site: Secondary | ICD-10-CM

## 2015-10-25 NOTE — ED Provider Notes (Signed)
CSN: [id number]     Arrival date & time 10/25/15  1616 History   First MD Initiated Contact with Patient 10/25/15 1814     Chief Complaint  Patient presents with  . Knee Pain   (Consider location/radiation/quality/duration/timing/severity/associated sxs/prior Treatment) Patient is a 20 y.o. female presenting with knee pain. The history is provided by the patient and a parent.  Knee Pain Location:  Knee (seen 2/5 for right knee pain and sts, sx have improvied but now left knee sx b/o no meds, still working  and standing.otj.) Time since incident:  2 days Injury: no   Knee location:  L knee Pain details:    Quality:  Burning   Radiates to:  Does not radiate   Severity:  Mild   Onset quality:  Gradual   Progression:  Unchanged Chronicity:  New Dislocation: no   Foreign body present:  No foreign bodies Prior injury to area:  No   Past Medical History  Diagnosis Date  . Raynaud disease    Past Surgical History  Procedure Laterality Date  . Fracture surgery     Family History  Problem Relation Age of Onset  . Aneurysm Mother   . Stroke Sister    Social History  Substance Use Topics  . Smoking status: Never Smoker   . Smokeless tobacco: None  . Alcohol Use: No   OB History    No data available     Review of Systems  Allergies  Review of patient's allergies indicates no known allergies.  Home Medications   Prior to Admission medications   Medication Sig Start Date End Date Taking? Authorizing Provider  acetaminophen (TYLENOL) 325 MG tablet Take 325 mg by mouth every 6 (six) hours as needed for mild pain.    Historical Provider, MD  albuterol (PROVENTIL HFA;VENTOLIN HFA) 108 (90 BASE) MCG/ACT inhaler Inhale 1-2 puffs into the lungs every 6 (six) hours as needed for wheezing or shortness of breath. 04/04/14   Baron Sane, PA-C  amLODipine (NORVASC) 5 MG tablet Take 1 tablet (5 mg total) by mouth daily. 08/11/15   Josalyn Funches, MD  cetirizine (ZYRTEC) 10  MG tablet Take 1 tablet (10 mg total) by mouth daily as needed for allergies. One tab daily for allergies 08/11/15   Boykin Nearing, MD  cyclobenzaprine (FLEXERIL) 10 MG tablet Take 1 tablet (10 mg total) by mouth at bedtime. 08/11/15   Boykin Nearing, MD  diclofenac (CATAFLAM) 50 MG tablet Take 1 tablet (50 mg total) by mouth 3 (three) times daily. For knee pain 10/18/15   Billy Fischer, MD  ferrous sulfate 325 (65 FE) MG tablet Take 1 tablet (325 mg total) by mouth 2 (two) times daily. With 1/2 glass of orange juice 08/12/15   Josalyn Funches, MD  fluticasone (FLONASE) 50 MCG/ACT nasal spray Place 2 sprays into both nostrils daily. 08/11/15   Josalyn Funches, MD  ibuprofen (ADVIL,MOTRIN) 600 MG tablet Take 1 tablet (600 mg total) by mouth every 6 (six) hours as needed for mild pain or moderate pain. 05/14/15   Clayton Bibles, PA-C  sodium chloride (OCEAN) 0.65 % SOLN nasal spray Place 1 spray into both nostrils as needed for congestion. 05/14/15   Clayton Bibles, PA-C   Meds Ordered and Administered this Visit  Medications - No data to display  BP 147/91 mmHg  Pulse 95  Temp(Src) 98.8 F (37.1 C) (Oral)  Resp 18  SpO2 100%  LMP 10/25/2015 No data found.   Physical Exam  Constitutional:  She is oriented to person, place, and time. She appears well-developed and well-nourished.  Musculoskeletal: Normal range of motion. She exhibits tenderness. She exhibits no edema.       Left knee: She exhibits normal range of motion, no swelling, no effusion, no deformity, normal alignment, no LCL laxity, normal patellar mobility, normal meniscus and no MCL laxity. Tenderness found. Medial joint line, lateral joint line and patellar tendon tenderness noted.  Neurological: She is alert and oriented to person, place, and time.  Skin: Skin is warm and dry.  Nursing note and vitals reviewed.   ED Course  Procedures (including critical care time)  Labs Review Labs Reviewed - No data to display  Imaging Review No  results found.   Visual Acuity Review  Right Eye Distance:   Left Eye Distance:   Bilateral Distance:    Right Eye Near:   Left Eye Near:    Bilateral Near:         MDM   1. Left knee tendonitis        Billy Fischer, MD 10/25/15 [phone]

## 2015-10-25 NOTE — ED Notes (Signed)
Dr  Rebeca Allegra request  Work  Note  To  Be  Given  For  Pt to  Return on  On  Weds     15  feb

## 2015-10-25 NOTE — Discharge Instructions (Signed)
Use ice and wear sleeve for comfort, use medicine as needed and see orthopedist if further problems, no work until wed.

## 2015-10-25 NOTE — ED Notes (Signed)
Pt  Reports  Pain l  Knee    Was  Seen  ucc  For   Pain      In r  Knee        Pt   Was  Seen   1  Week  Ago   And did  Not  Get  Her  meds  Filled

## 2015-10-29 ENCOUNTER — Ambulatory Visit: Payer: Self-pay | Attending: Family Medicine | Admitting: *Deleted

## 2015-10-29 DIAGNOSIS — Z3042 Encounter for surveillance of injectable contraceptive: Secondary | ICD-10-CM

## 2015-10-29 MED ORDER — MEDROXYPROGESTERONE ACETATE 150 MG/ML IM SUSP
150.0000 mg | Freq: Once | INTRAMUSCULAR | Status: AC
Start: 2015-10-29 — End: 2015-10-29
  Administered 2015-10-29: 150 mg via INTRAMUSCULAR

## 2015-10-29 NOTE — Progress Notes (Signed)
Patient here today for Depo Provera injection.  Depo given today RUOQ.  Site unremarkable & patient tolerated injection.  Next injection due May 4-18, 2017.  AVS given to patient.  Burna Forts, BSN, RN-BC

## 2015-11-13 ENCOUNTER — Other Ambulatory Visit: Payer: Self-pay | Admitting: Family Medicine

## 2015-11-13 DIAGNOSIS — M25569 Pain in unspecified knee: Secondary | ICD-10-CM

## 2015-11-13 MED ORDER — DICLOFENAC POTASSIUM 50 MG PO TABS: 50.0000 mg | ORAL_TABLET | Freq: Three times a day (TID) | ORAL | Status: DC | PRN

## 2015-11-16 ENCOUNTER — Emergency Department (HOSPITAL_COMMUNITY)
Admission: EM | Admit: 2015-11-16 | Discharge: 2015-11-16 | Disposition: A | Discharge status: Home / Self Care | Payer: Self-pay | Source: Home / Self Care | Attending: Family Medicine | Admitting: Family Medicine

## 2015-11-16 ENCOUNTER — Encounter (HOSPITAL_COMMUNITY): Payer: Self-pay | Admitting: Emergency Medicine

## 2015-11-16 DIAGNOSIS — M779 Enthesopathy, unspecified: Secondary | ICD-10-CM

## 2015-11-16 NOTE — ED Notes (Signed)
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

## 2015-11-16 NOTE — Discharge Instructions (Signed)
Tendinitis and Tenosynovitis  °Tendinitis is inflammation of the tendon. Tenosynovitis is inflammation of the lining around the tendon (tendon sheath). These painful conditions often occur at once. Tendons attach muscle to bone. To move a limb, force from the muscle moves through the tendon, to the bone. These conditions often cause increased pain when moving. Tendinitis may be caused by a small or partial tear in the tendon.  °SYMPTOMS  °· Pain, tenderness, redness, bruising, or swelling at the injury. °· Loss of normal joint movement. °· Pain that gets worse with use of the muscle and joint attached to the tendon. °· Weakness in the tendon, caused by calcium build up that may occur with tendinitis. °· Commonly affected tendons: °¨ Achilles tendon (calf of leg). °¨ Rotator cuff (shoulder joint). °¨ Patellar tendon (kneecap to shin). °¨ Peroneal tendon (ankle). °¨ Posterior tibial tendon (inner ankle). °¨ Biceps tendon (in front of shoulder). °CAUSES  °· Sudden strain on a flexed muscle, muscle overuse, sudden increase or change in activity, vigorous activity. °· Result of a direct hit (less common). °· Poor muscle action (biomechanics). °RISK INCREASES WITH: °· Injury (trauma). °· Too much exercise. °· Sudden change in athletic activity. °· Incorrect exercise form or technique. °· Poor strength and flexibility. °· Not warming-up properly before activity. °· Returning to activity before healing is complete. °PREVENTION  °· Warm-up and stretch properly before activity. °· Maintain physical fitness: °¨ Joint flexibility. °¨ Muscle strength and endurance. °¨ Fitness that increases heart rate. °· Learn and use proper exercise techniques. °· Use rehabilitation exercises to strengthen weak muscles and tendons. °· Ice the tendon after activity, to reduce recurring inflammation. °· Wear proper fitting protective equipment for specific tendons, when indicated. °PROGNOSIS  °When treated properly, can be cured in 6 to 8 weeks.  Recovery may take longer, depending on degree of injury.  °RELATED COMPLICATIONS  °· Re-injury or recurring symptoms. °· Permanent weakness or joint stiffness, if injury is severe and recovery is not completed. °· Delayed healing, if sports are started before healing is complete. °· Tearing apart (rupture) of the inflamed tendon. Tendinitis means the tendon is injured and must recover. °TREATMENT  °Treatment first involves ice, medicine, and rest from aggravating activities. This reduces pain and inflammation. Modifying your activity may be considered to prevent recurring injury. A brace, elastic bandage wrap, splint, cast, or sling may be prescribed to protect the joint for a short period. After that period, strengthening and stretching exercise may help to regain strength and full range of motion. If the condition persists, despite non-surgical treatment, surgery may be recommended to remove the inflamed tendon lining. Corticosteroid injections may be given to reduce inflammation. However, these injections may weaken the tendon and increase your risk for tendon rupture. °MEDICATION  °· If pain medicine is needed, nonsteroidal anti-inflammatory medicines (aspirin and ibuprofen), or other minor pain relievers (acetaminophen), are often recommended. °· Do not take pain medicine for 7 days before surgery. °· Prescription pain relievers are usually prescribed only after surgery. Use only as directed and only as much as you need. °· Ointments applied to the skin may be helpful. °· Corticosteroid injections may be given to reduce inflammation. However, this may increase your risk of a tendon rupture. °HEAT AND COLD °· Cold treatment (icing) relieves pain and reduces inflammation. Cold treatment should be applied for 10 to 15 minutes every 2 to 3 hours, and immediately after activity that aggravates your symptoms. Use ice packs or an ice massage. °· Heat   treatment may be used before performing stretching and strengthening  activities prescribed by your caregiver, physical therapist, or athletic trainer. Use a heat pack or a warm water soak. °SEEK MEDICAL CARE IF:  °· Symptoms get worse or do not improve, despite treatment. °· Pain becomes too much to tolerate. °· You develop numbness or tingling. °· Toes become cold, or toenails become blue, gray, or dark colored. °· New, unexplained symptoms develop. (Drugs used in treatment may produce side effects.) °  °This information is not intended to replace advice given to you by your health care provider. Make sure you discuss any questions you have with your health care provider. °  °Document Released: 08/29/2005 Document Revised: 11/21/2011 Document Reviewed: 12/11/2008 °Elsevier Interactive Patient Education ©2016 [employer] ° °

## 2015-11-16 NOTE — ED Provider Notes (Signed)
CSN: [id number]     Arrival date & time 11/16/15  1302 History   First MD Initiated Contact with Patient 11/16/15 1328     Chief Complaint  Patient presents with  . 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  QUALITY:similar to knee tendonitis MODIFYING FACTORS:RICE ASSOCIATED SYMPTOMS:painful to extend wrist TIMING:constant OCCUPATION:  Past Medical History  Diagnosis Date  . Raynaud disease    Past Surgical History  Procedure Laterality Date  . Fracture surgery     Family History  Problem Relation Age of Onset  . Aneurysm Mother   . Stroke Sister    Social History  Substance Use Topics  . Smoking status: Never Smoker   . Smokeless tobacco: None  . Alcohol Use: No   OB History    No data available     Review of Systems Bilateral wrist pain.  Allergies  Review of patient's allergies indicates no known allergies.  Home Medications   Prior to Admission medications   Medication Sig Start Date End Date Taking? Authorizing Provider  acetaminophen (TYLENOL) 325 MG tablet Take 325 mg by mouth every 6 (six) hours as needed for mild pain.    Historical Provider, MD  albuterol (PROVENTIL HFA;VENTOLIN HFA) 108 (90 BASE) MCG/ACT inhaler Inhale 1-2 puffs into the lungs every 6 (six) hours as needed for wheezing or shortness of breath. 04/04/14   Baron Sane, PA-C  amLODipine (NORVASC) 5 MG tablet Take 1 tablet (5 mg total) by mouth daily. 08/11/15   Josalyn Funches, MD  cetirizine (ZYRTEC) 10 MG tablet Take 1 tablet (10 mg total) by mouth daily as needed for allergies. One tab daily for allergies 08/11/15   Boykin Nearing, MD  cyclobenzaprine (FLEXERIL) 10 MG  tablet Take 1 tablet (10 mg total) by mouth at bedtime. 08/11/15   Boykin Nearing, MD  diclofenac (CATAFLAM) 50 MG tablet Take 1 tablet (50 mg total) by mouth 3 (three) times daily as needed. For knee pain 11/13/15   Boykin Nearing, MD  ferrous sulfate 325 (65 FE) MG tablet Take 1 tablet (325 mg total) by mouth 2 (two) times daily. With 1/2 glass of orange juice 08/12/15   Josalyn Funches, MD  fluticasone (FLONASE) 50 MCG/ACT nasal spray Place 2 sprays into both nostrils daily. 08/11/15   Josalyn Funches, MD  ibuprofen (ADVIL,MOTRIN) 600 MG tablet Take 1 tablet (600 mg total) by mouth every 6 (six) hours as needed for mild pain or moderate pain. 05/14/15   Clayton Bibles, PA-C  sodium chloride (OCEAN) 0.65 % SOLN nasal spray Place 1 spray into both nostrils as needed for congestion. 05/14/15   Clayton Bibles, PA-C   Meds Ordered and Administered this Visit  Medications - No data to display  BP 137/79 mmHg  Pulse 100  Temp(Src) 98.8 F (37.1 C) (Oral)  Resp 16  SpO2 100%  LMP 10/25/2015 No data found.   Physical Exam  Constitutional: She is oriented to person, place, and time. She appears well-developed and well-nourished.  HENT:  Head: Normocephalic and atraumatic.  Pulmonary/Chest: Effort normal.  Musculoskeletal: She exhibits tenderness.  Both wrist extensor surface, tender and warm to palpation.  Pain with motion.  R>L  Neurological: She is alert and oriented to person, place, and time.  Skin: Skin  is warm and dry.    ED Course  Procedures (including critical care time)  Labs Review Labs Reviewed - No data to display  Imaging Review No results found.   Visual Acuity Review  Right Eye Distance:   Left Eye Distance:   Bilateral Distance:    Right Eye Near:   Left Eye Near:    Bilateral Near:         MDM   1. Tendonitis    (both wrists) R>L    Konrad Felix, [place] 11/16/15 1437

## 2015-12-15 ENCOUNTER — Encounter (HOSPITAL_COMMUNITY): Payer: Self-pay

## 2015-12-15 ENCOUNTER — Emergency Department (HOSPITAL_COMMUNITY)
Admission: EM | Admit: 2015-12-15 | Discharge: 2015-12-15 | Disposition: A | Discharge status: Home / Self Care | Payer: Self-pay | Attending: Emergency Medicine | Admitting: Emergency Medicine

## 2015-12-15 ENCOUNTER — Emergency Department (EMERGENCY_DEPARTMENT_HOSPITAL)
Admit: 2015-12-15 | Discharge: 2015-12-15 | Disposition: A | Discharge status: Home / Self Care | Payer: Self-pay | Attending: Emergency Medicine | Admitting: Emergency Medicine

## 2015-12-15 DIAGNOSIS — M7989 Other specified soft tissue disorders: Secondary | ICD-10-CM

## 2015-12-15 DIAGNOSIS — M25562 Pain in left knee: Secondary | ICD-10-CM | POA: Insufficient documentation

## 2015-12-15 DIAGNOSIS — Z79899 Other long term (current) drug therapy: Secondary | ICD-10-CM | POA: Insufficient documentation

## 2015-12-15 DIAGNOSIS — Z8679 Personal history of other diseases of the circulatory system: Secondary | ICD-10-CM | POA: Insufficient documentation

## 2015-12-15 DIAGNOSIS — D509 Iron deficiency anemia, unspecified: Secondary | ICD-10-CM | POA: Insufficient documentation

## 2015-12-15 DIAGNOSIS — R2232 Localized swelling, mass and lump, left upper limb: Secondary | ICD-10-CM | POA: Insufficient documentation

## 2015-12-15 DIAGNOSIS — R2242 Localized swelling, mass and lump, left lower limb: Secondary | ICD-10-CM | POA: Insufficient documentation

## 2015-12-15 LAB — CBC WITH DIFFERENTIAL/PLATELET
BASOS ABS: 0 10*3/uL (ref 0.0–0.1)
BASOS PCT: 0 %
EOS PCT: 1 %
Eosinophils Absolute: 0.1 10*3/uL (ref 0.0–0.7)
HCT: 32 % — ABNORMAL LOW (ref 36.0–46.0)
Hemoglobin: 10.4 g/dL — ABNORMAL LOW (ref 12.0–15.0)
Lymphocytes Relative: 22 %
Lymphs Abs: 1.6 10*3/uL (ref 0.7–4.0)
MCH: 23.3 pg — ABNORMAL LOW (ref 26.0–34.0)
MCHC: 32.5 g/dL (ref 30.0–36.0)
MCV: 71.6 fL — ABNORMAL LOW (ref 78.0–100.0)
MONO ABS: 0.9 10*3/uL (ref 0.1–1.0)
Monocytes Relative: 12 %
Neutro Abs: 4.6 10*3/uL (ref 1.7–7.7)
Neutrophils Relative %: 65 %
PLATELETS: 414 10*3/uL — AB (ref 150–400)
RBC: 4.47 MIL/uL (ref 3.87–5.11)
RDW: 15.5 % (ref 11.5–15.5)
WBC: 7.2 10*3/uL (ref 4.0–10.5)

## 2015-12-15 LAB — I-STAT CHEM 8, ED
BUN: 7 mg/dL (ref 6–20)
Calcium, Ion: 1.16 mmol/L (ref 1.12–1.23)
Chloride: 103 mmol/L (ref 101–111)
Creatinine, Ser: 0.5 mg/dL (ref 0.44–1.00)
GLUCOSE: 96 mg/dL (ref 65–99)
HEMATOCRIT: 37 % (ref 36.0–46.0)
Hemoglobin: 12.6 g/dL (ref 12.0–15.0)
POTASSIUM: 3.9 mmol/L (ref 3.5–5.1)
Sodium: 139 mmol/L (ref 135–145)
TCO2: 22 mmol/L (ref 0–100)

## 2015-12-15 MED ORDER — PREDNISONE 20 MG PO TABS: 40.0000 mg | ORAL_TABLET | Freq: Every day | ORAL | Status: DC

## 2015-12-15 MED ORDER — FERROUS SULFATE 325 (65 FE) MG PO TABS: 325.0000 mg | ORAL_TABLET | Freq: Two times a day (BID) | ORAL | Status: DC

## 2015-12-15 NOTE — Progress Notes (Addendum)
*  PRELIMINARY RESULTS* Vascular Ultrasound Left upper extremity venous duplex has been completed.  Preliminary findings: No evidence of DVT or superficial thrombosis. Enlarged left clavicular lymph nodes visualized.    Landry Mellow, RDMS, RVT  12/15/2015, 8:48 AM

## 2015-12-15 NOTE — Discharge Instructions (Signed)
Edema °Edema is an abnormal buildup of fluids in your body tissues. Edema is somewhat dependent on gravity to pull the fluid to the lowest place in your body. That makes the condition more common in the legs and thighs (lower extremities). Painless swelling of the feet and ankles is common and becomes more likely as you get older. It is also common in looser tissues, like around your eyes.  °When the affected area is squeezed, the fluid may move out of that spot and leave a dent for a few moments. This dent is called pitting.  °CAUSES  °There are many possible causes of edema. Eating too much salt and being on your feet or sitting for a long time can cause edema in your legs and ankles. Hot weather may make edema worse. Common medical causes of edema include: °· Heart failure. °· Liver disease. °· Kidney disease. °· Weak blood vessels in your legs. °· Cancer. °· An injury. °· Pregnancy. °· Some medications. °· Obesity.  °SYMPTOMS  °Edema is usually painless. Your skin may look swollen or shiny.  °DIAGNOSIS  °Your health care provider may be able to diagnose edema by asking about your medical history and doing a physical exam. You may need to have tests such as X-rays, an electrocardiogram, or blood tests to check for medical conditions that may cause edema.  °TREATMENT  °Edema treatment depends on the cause. If you have heart, liver, or kidney disease, you need the treatment appropriate for these conditions. General treatment may include: °· Elevation of the affected body part above the level of your heart. °· Compression of the affected body part. Pressure from elastic bandages or support stockings squeezes the tissues and forces fluid back into the blood vessels. This keeps fluid from entering the tissues. °· Restriction of fluid and salt intake. °· Use of a water pill (diuretic). These medications are appropriate only for some types of edema. They pull fluid out of your body and make you urinate more often. This  gets rid of fluid and reduces swelling, but diuretics can have side effects. Only use diuretics as directed by your health care provider. °HOME CARE INSTRUCTIONS  °· Keep the affected body part above the level of your heart when you are lying down.   °· Do not sit still or stand for prolonged periods.   °· Do not put anything directly under your knees when lying down. °· Do not wear constricting clothing or garters on your upper legs.   °· Exercise your legs to work the fluid back into your blood vessels. This may help the swelling go down.   °· Wear elastic bandages or support stockings to reduce ankle swelling as directed by your health care provider.   °· Eat a low-salt diet to reduce fluid if your health care provider recommends it.   °· Only take medicines as directed by your health care provider.  °SEEK MEDICAL CARE IF:  °· Your edema is not responding to treatment. °· You have heart, liver, or kidney disease and notice symptoms of edema. °· You have edema in your legs that does not improve after elevating them.   °· You have sudden and unexplained weight gain. °SEEK IMMEDIATE MEDICAL CARE IF:  °· You develop shortness of breath or chest pain.   °· You cannot breathe when you lie down. °· You develop pain, redness, or warmth in the swollen areas.   °· You have heart, liver, or kidney disease and suddenly get edema. °· You have a fever and your symptoms suddenly get worse. °MAKE SURE YOU:  °·   Understand these instructions.  Will watch your condition.  Will get help right away if you are not doing well or get worse.   This information is not intended to replace advice given to you by your health care provider. Make sure you discuss any questions you have with your health care provider.   Document Released: 08/29/2005 Document Revised: 09/19/2014 Document Reviewed: 06/21/2013 Elsevier Interactive Patient Education 2016 [employer].  Iron Deficiency Anemia, Adult Anemia is a condition in which there  are less red blood cells or hemoglobin in the blood than normal. Hemoglobin is the part of red blood cells that carries oxygen. Iron deficiency anemia is anemia caused by too little iron. It is the most common type of anemia. It may leave you tired and short of breath. CAUSES   Lack of iron in the diet.  Poor absorption of iron, as seen with intestinal disorders.  Intestinal bleeding.  Heavy periods. SIGNS AND SYMPTOMS  Mild anemia may not be noticeable. Symptoms may include:  Fatigue.  Headache.  Pale skin.  Weakness.  Tiredness.  Shortness of breath.  Dizziness.  Cold hands and feet.  Fast or irregular heartbeat. DIAGNOSIS  Diagnosis requires a thorough evaluation and physical exam by your health care provider. Blood tests are generally used to confirm iron deficiency anemia. Additional tests may be done to find the underlying cause of your anemia. These may include:  Testing for blood in the stool (fecal occult blood test).  A procedure to see inside the colon and rectum (colonoscopy).  A procedure to see inside the esophagus and stomach (endoscopy). TREATMENT  Iron deficiency anemia is treated by correcting the cause of the deficiency. Treatment may involve:  Adding iron-rich foods to your diet.  Taking iron supplements. Pregnant or breastfeeding women need to take extra iron because their normal diet usually does not provide the required amount.  Taking vitamins. Vitamin C improves the absorption of iron. Your health care provider may recommend that you take your iron tablets with a glass of orange juice or vitamin C supplement.  Medicines to make heavy menstrual flow lighter.  Surgery. HOME CARE INSTRUCTIONS   Take iron as directed by your health care provider.  If you cannot tolerate taking iron supplements by mouth, talk to your health care provider about taking them through a vein (intravenously) or an injection into a muscle.  For the best iron  absorption, iron supplements should be taken on an empty stomach. If you cannot tolerate them on an empty stomach, you may need to take them with food.  Do not drink milk or take antacids at the same time as your iron supplements. Milk and antacids may interfere with the absorption of iron.  Iron supplements can cause constipation. Make sure to include fiber in your diet to prevent constipation. A stool softener may also be recommended.  Take vitamins as directed by your health care provider.  Eat a diet rich in iron. Foods high in iron include liver, lean beef, whole-grain bread, eggs, dried fruit, and dark green leafy vegetables. SEEK IMMEDIATE MEDICAL CARE IF:   You faint. If this happens, do not drive. Call your local emergency services (911 in [place] if no other help is available.  You have chest pain.  You feel nauseous or vomit.  You have severe or increased shortness of breath with activity.  You feel weak.  You have a rapid heartbeat.  You have unexplained sweating.  You become light-headed when getting up from a  chair or bed. MAKE SURE YOU:   Understand these instructions.  Will watch your condition.  Will get help right away if you are not doing well or get worse.   This information is not intended to replace advice given to you by your health care provider. Make sure you discuss any questions you have with your health care provider.   Document Released: 08/26/2000 Document Revised: 09/19/2014 Document Reviewed: 05/06/2013 Elsevier Interactive Patient Education [employer].

## 2015-12-15 NOTE — ED Notes (Signed)
Discharge instructions, follow up care, and rx x2 reviewed with patient. Patient verbalized understanding. 

## 2015-12-15 NOTE — ED Provider Notes (Signed)
CSN: [id number]     Arrival date & time 12/15/15  0708 History   First MD Initiated Contact with Patient 12/15/15 0719     Chief Complaint  Patient presents with  . Leg Swelling     The history is provided by the patient.  Patient presented with swelling in her left hand left knee left shoulder and somewhat in her face. States his been going on for couple months. States she was seen at urgent care and diagnosed with tendinitis of the left knee. No difficulty breathing. Has a history of Raynaud's disease. No fevers or chills. No trauma. No difficulty breathing. No cough. No fevers. States she looked up online and is worried about lupus. She has been taking diclofenac without relief.    Past Medical History  Diagnosis Date  . Raynaud disease    Past Surgical History  Procedure Laterality Date  . Fracture surgery    . Tonsillectomy     Family History  Problem Relation Age of Onset  . Aneurysm Mother   . Stroke Sister    Social History  Substance Use Topics  . Smoking status: Never Smoker   . Smokeless tobacco: None  . Alcohol Use: No   OB History    No data available     Review of Systems  Constitutional: Negative for fever, chills and fatigue.  Respiratory: Negative for choking and shortness of breath.   Cardiovascular: Negative for chest pain.  Gastrointestinal: Negative for abdominal pain.  Genitourinary: Negative for frequency.  Musculoskeletal: Negative for back pain.  Skin: Positive for color change. Negative for wound.  Neurological: Negative for weakness and numbness.      Allergies  Review of patient's allergies indicates no known allergies.  Home Medications   Prior to Admission medications   Medication Sig Start Date End Date Taking? Authorizing Provider  albuterol (PROVENTIL HFA;VENTOLIN HFA) 108 (90 BASE) MCG/ACT inhaler Inhale 1-2 puffs into the lungs every 6 (six) hours as needed for wheezing or shortness of breath. 04/04/14  Yes Jennifer  Piepenbrink, PA-C  diclofenac (CATAFLAM) 50 MG tablet Take 1 tablet (50 mg total) by mouth 3 (three) times daily as needed. For knee pain Patient taking differently: Take 50 mg by mouth 3 (three) times daily as needed (for knee pain).  11/13/15  Yes Josalyn Funches, MD  amLODipine (NORVASC) 5 MG tablet Take 1 tablet (5 mg total) by mouth daily. Patient not taking: Reported on 12/15/2015 08/11/15   Boykin Nearing, MD  cetirizine (ZYRTEC) 10 MG tablet Take 1 tablet (10 mg total) by mouth daily as needed for allergies. One tab daily for allergies Patient not taking: Reported on 12/15/2015 08/11/15   Boykin Nearing, MD  cyclobenzaprine (FLEXERIL) 10 MG tablet Take 1 tablet (10 mg total) by mouth at bedtime. Patient not taking: Reported on 12/15/2015 08/11/15   Boykin Nearing, MD  ferrous sulfate 325 (65 FE) MG tablet Take 1 tablet (325 mg total) by mouth 2 (two) times daily. With 1/2 glass of orange juice 12/15/15   Davonna Belling, MD  fluticasone [facility]) 50 MCG/ACT nasal spray Place 2 sprays into both nostrils daily. Patient not taking: Reported on 12/15/2015 08/11/15   Boykin Nearing, MD  predniSONE (DELTASONE) 20 MG tablet Take 2 tablets (40 mg total) by mouth daily. 12/15/15   Davonna Belling, MD   BP 124/79 mmHg  Pulse 102  Temp(Src) 99.2 F (37.3 C) (Oral)  Resp 16  Ht 5\' 3"  (1.6 m)  Wt 170 lb (77.111 kg)  BMI 30.12 kg/m2  SpO2 98% Physical Exam  Constitutional: She appears well-developed.  HENT:  Head: Atraumatic.  Face is symmetric. May have mild swelling of lower lip.  Cardiovascular: Normal rate.   Pulmonary/Chest: Effort normal.  Abdominal: Soft.  Musculoskeletal:  Some swelling of left hand compared to right. Sensation grossly intact. Some mild pain with flexion and extension. Some pain with flexion extension at the wrist. Strong radial pulse. Good capillary refill. Some decreased size of fingertips bilaterally.  Skin: Skin is warm.    ED Course  Procedures (including critical  care time) Labs Review Labs Reviewed  CBC WITH DIFFERENTIAL/PLATELET - Abnormal; Notable for the following:    Hemoglobin 10.4 (*)    HCT 32.0 (*)    MCV 71.6 (*)    MCH 23.3 (*)    Platelets 414 (*)    All other components within normal limits  I-STAT CHEM 8, ED    Imaging Review No results found. I have personally reviewed and evaluated these images and lab results as part of my medical decision-making.   EKG Interpretation None      MDM   Final diagnoses:  Swelling of left hand  Left knee pain  IDA (iron deficiency anemia)    patientWith swelling in her left hand AP face and pain in her left knee. Has been going for a while. Nonspecific. No blood clot and arm. Has continued anemia. There is worry for something such as autoimmune specially with her right knots. Will give short dose of steroids to follow-up with primary care doctor.    Davonna Belling, MD 12/15/15 [phone]

## 2015-12-15 NOTE — ED Notes (Signed)
Ultrasound at bedside

## 2015-12-15 NOTE — ED Notes (Signed)
MD at bedside. 

## 2015-12-15 NOTE — ED Notes (Signed)
Patient reports that she began having swelling of left knee 2 months ago and was seen at an UC. Patient now has swelling of her left hand, shoulder and left side of face. Patient denies any SOB or difficulty swallowing.

## 2015-12-24 ENCOUNTER — Emergency Department (HOSPITAL_COMMUNITY)
Admission: EM | Admit: 2015-12-24 | Discharge: 2015-12-25 | Disposition: A | Discharge status: Home / Self Care | Payer: Self-pay | Attending: Emergency Medicine | Admitting: Emergency Medicine

## 2015-12-24 ENCOUNTER — Encounter (HOSPITAL_COMMUNITY): Payer: Self-pay | Admitting: *Deleted

## 2015-12-24 DIAGNOSIS — R0602 Shortness of breath: Secondary | ICD-10-CM | POA: Insufficient documentation

## 2015-12-24 DIAGNOSIS — M79641 Pain in right hand: Secondary | ICD-10-CM | POA: Insufficient documentation

## 2015-12-24 DIAGNOSIS — M2550 Pain in unspecified joint: Secondary | ICD-10-CM

## 2015-12-24 DIAGNOSIS — Z8679 Personal history of other diseases of the circulatory system: Secondary | ICD-10-CM | POA: Insufficient documentation

## 2015-12-24 DIAGNOSIS — M79644 Pain in right finger(s): Secondary | ICD-10-CM | POA: Insufficient documentation

## 2015-12-24 DIAGNOSIS — M7989 Other specified soft tissue disorders: Secondary | ICD-10-CM | POA: Insufficient documentation

## 2015-12-24 DIAGNOSIS — R079 Chest pain, unspecified: Secondary | ICD-10-CM | POA: Insufficient documentation

## 2015-12-24 DIAGNOSIS — M25531 Pain in right wrist: Secondary | ICD-10-CM | POA: Insufficient documentation

## 2015-12-24 DIAGNOSIS — Z79899 Other long term (current) drug therapy: Secondary | ICD-10-CM | POA: Insufficient documentation

## 2015-12-24 DIAGNOSIS — R220 Localized swelling, mass and lump, head: Secondary | ICD-10-CM | POA: Insufficient documentation

## 2015-12-24 NOTE — ED Notes (Addendum)
Pt complains of swelling to her hands, ankles, face and right wrist for the past 4 months. Pt states she was given prednisone last month, which she states has not helped.

## 2015-12-25 ENCOUNTER — Emergency Department (HOSPITAL_COMMUNITY): Payer: Self-pay

## 2015-12-25 MED ORDER — PREDNISONE 20 MG PO TABS: 60.0000 mg | ORAL_TABLET | Freq: Every day | ORAL | Status: DC

## 2015-12-25 MED ORDER — CYCLOBENZAPRINE HCL 10 MG PO TABS: 10.0000 mg | ORAL_TABLET | Freq: Three times a day (TID) | ORAL | Status: DC | PRN

## 2015-12-25 MED ORDER — PREDNISONE 20 MG PO TABS
60.0000 mg | ORAL_TABLET | Freq: Once | ORAL | Status: AC
  Administered 2015-12-25: 60 mg via ORAL
  Filled 2015-12-25: qty 3

## 2015-12-25 NOTE — ED Provider Notes (Signed)
CSN: [id number]     Arrival date & time 12/24/15  1814 History  By signing my name below, I, [name] [name], attest that this documentation has been prepared under the direction and in the presence of [name] Fuel, MD. Electronically Signed: [name], ED Scribe. 12/25/2015. 1:30 AM    Chief Complaint  Patient presents with  . Leg Swelling  . Facial Swelling   The history is provided by the patient. No language interpreter was used.   [name] [name] is a 20 y.o. female with history of Raynaud disease who presents to the Emergency Department complaining of recurrent facial and joint pain/swelling with onset 3 months ago. She states that the swelling and pain is worse at the right hand, wrist, fingers, and ankle today. She has been seen for this in the past and treated with diclofenac and prednisone most recently without significant improvement. Associated symptoms include muscles spasms, SOB today and sharp central chest pain with inspiration. Pt denies fever and new rash.   Past Medical History  Diagnosis Date  . Raynaud disease    Past Surgical History  Procedure Laterality Date  . Fracture surgery    . Tonsillectomy     Family History  Problem Relation Age of Onset  . Aneurysm Mother   . Stroke Sister    Social History  Substance Use Topics  . Smoking status: Never Smoker   . Smokeless tobacco: None  . Alcohol Use: No   OB History    No data available     Review of Systems  Constitutional: Negative for fever.  HENT: Positive for facial swelling.   Respiratory: Positive for shortness of breath.   Cardiovascular: Positive for chest pain.  Musculoskeletal: Positive for myalgias, joint swelling and arthralgias.  Skin: Negative for rash.  All other systems reviewed and are negative.  Allergies  Review of patient's allergies indicates no known allergies.  Home Medications   Prior to Admission medications   Medication Sig Start Date End Date Taking? Authorizing  Provider  albuterol (PROVENTIL HFA;VENTOLIN HFA) 108 (90 BASE) MCG/ACT inhaler Inhale 1-2 puffs into the lungs every 6 (six) hours as needed for wheezing or shortness of breath. 04/04/14  Yes Jennifer Piepenbrink, PA-C  ferrous sulfate 325 (65 FE) MG tablet Take 1 tablet (325 mg total) by mouth 2 (two) times daily. With 1/2 glass of orange juice 12/15/15  Yes Davonna Belling, MD  amLODipine (NORVASC) 5 MG tablet Take 1 tablet (5 mg total) by mouth daily. Patient not taking: Reported on 12/15/2015 08/11/15   Boykin Nearing, MD  cetirizine (ZYRTEC) 10 MG tablet Take 1 tablet (10 mg total) by mouth daily as needed for allergies. One tab daily for allergies Patient not taking: Reported on 12/15/2015 08/11/15   Boykin Nearing, MD  cyclobenzaprine (FLEXERIL) 10 MG tablet Take 1 tablet (10 mg total) by mouth at bedtime. Patient not taking: Reported on 12/15/2015 08/11/15   Boykin Nearing, MD  diclofenac (CATAFLAM) 50 MG tablet Take 1 tablet (50 mg total) by mouth 3 (three) times daily as needed. For knee pain Patient not taking: Reported on 12/25/2015 11/13/15   Boykin Nearing, MD  fluticasone (FLONASE) 50 MCG/ACT nasal spray Place 2 sprays into both nostrils daily. Patient not taking: Reported on 12/15/2015 08/11/15   Boykin Nearing, MD  predniSONE (DELTASONE) 20 MG tablet Take 2 tablets (40 mg total) by mouth daily. Patient not taking: Reported on 12/25/2015 12/15/15   Davonna Belling, MD   BP 122/79 mmHg  Pulse 98  Temp(Src) 98.9 F (37.2 C) (Oral)  Resp 16  SpO2 100% Physical Exam  Constitutional: She is oriented to person, place, and time. She appears well-developed and well-nourished. No distress.  HENT:  Head: Normocephalic and atraumatic.  Eyes: EOM are normal. Pupils are equal, round, and reactive to light.  Neck: Normal range of motion. Neck supple. No JVD present.  Cardiovascular: Normal rate, regular rhythm and normal heart sounds.   No murmur heard. Pulmonary/Chest: Effort normal and breath  sounds normal. She has no wheezes. She has no rales. She exhibits no tenderness.  Abdominal: Soft. Bowel sounds are normal. She exhibits no distension and no mass. There is no tenderness.  Musculoskeletal: Normal range of motion. She exhibits no edema.  Mild soft tissue swelling of the right wrist, hand, fingers  Lymphadenopathy:    She has no cervical adenopathy.  Neurological: She is alert and oriented to person, place, and time. No cranial nerve deficit. She exhibits normal muscle tone. Coordination normal.  Skin: Skin is warm and dry. No rash noted.  Psychiatric: She has a normal mood and affect. Her behavior is normal. Judgment and thought content normal.  Nursing note and vitals reviewed.   ED Course  Procedures (including critical care time) DIAGNOSTIC STUDIES: Oxygen Saturation is 100% on RA,  normal by my interpretation.    COORDINATION OF CARE: 1:23 AM Discussed treatment plan which includes CXR and prednisone with pt at bedside and pt agreed to plan.   Imaging Review Dg Chest 2 View  12/25/2015  CLINICAL DATA:  Dyspnea and chest pain this morning. EXAM: CHEST  2 VIEW COMPARISON:  02/13/2015 FINDINGS: The heart size and mediastinal contours are within normal limits. Both lungs are clear. The visualized skeletal structures are unremarkable. IMPRESSION: No active cardiopulmonary disease. Electronically Signed   By: Andreas Newport M.D.   On: 12/25/2015 03:08   I have personally reviewed and evaluated these images as part of my medical decision-making.    MDM   Final diagnoses:  Polyarthralgia    Polyarthralgia along with history of Raynaud's phenomenon. Also suggestion of pleuritis. She was sent for chest x-ray which appears to be normal. Old records are reviewed and she has several ED and urgent care visits related to these symptoms. She did get a.m. for day course of prednisone 40 mg daily which gave partial relief. I have talked with her about the need for definitive  diagnosis, as well as the fact that testing needs to be done through her PCP's office or through her rheumatologist. She is referred back to her PCP. She does have symptoms of muscle spasms and she'll be given a prescription for cyclobenzaprine. She'll also be given a trial of a slightly higher dose of prednisone to see if she gets better relief.  I personally performed the services described in this documentation, which was scribed in my presence. The recorded information has been reviewed and is accurate.      [name] Fuel, MD [id number]

## 2015-12-25 NOTE — Discharge Instructions (Signed)
Years symptoms are suggestive of lupus or rheumatoid arthritis. The evaluation for this is done as an outpatient. Please see your doctor at the [facility] who will either to the necessary tests there, or refer you to a rheumatologist.  Prednisone tablets What is this medicine? PREDNISONE (PRED ni sone) is a corticosteroid. It is commonly used to treat inflammation of the skin, joints, lungs, and other organs. Common conditions treated include asthma, allergies, and arthritis. It is also used for other conditions, such as blood disorders and diseases of the adrenal glands. This medicine may be used for other purposes; ask your health care provider or pharmacist if you have questions. What should I tell my health care provider before I take this medicine? They need to know if you have any of these conditions: -Cushing's syndrome -diabetes -glaucoma -heart disease -high blood pressure -infection (especially a virus infection such as chickenpox, cold sores, or herpes) -kidney disease -liver disease -mental illness -myasthenia gravis -osteoporosis -seizures -stomach or intestine problems -thyroid disease -an unusual or allergic reaction to lactose, prednisone, other medicines, foods, dyes, or preservatives -pregnant or trying to get pregnant -breast-feeding How should I use this medicine? Take this medicine by mouth with a glass of water. Follow the directions on the prescription label. Take this medicine with food. If you are taking this medicine once a day, take it in the morning. Do not take more medicine than you are told to take. Do not suddenly stop taking your medicine because you may develop a severe reaction. Your doctor will tell you how much medicine to take. If your doctor wants you to stop the medicine, the dose may be slowly lowered over time to avoid any side effects. Talk to your pediatrician regarding the use of this medicine in children. Special care  may be needed. Overdosage: If you think you have taken too much of this medicine contact a poison control center or emergency room at once. NOTE: This medicine is only for you. Do not share this medicine with others. What if I miss a dose? If you miss a dose, take it as soon as you can. If it is almost time for your next dose, talk to your doctor or health care professional. You may need to miss a dose or take an extra dose. Do not take double or extra doses without advice. What may interact with this medicine? Do not take this medicine with any of the following medications: -metyrapone -mifepristone This medicine may also interact with the following medications: -aminoglutethimide -amphotericin B -aspirin and aspirin-like medicines -barbiturates -certain medicines for diabetes, like glipizide or glyburide -cholestyramine -cholinesterase inhibitors -cyclosporine -digoxin -diuretics -ephedrine -female hormones, like estrogens and birth control pills -isoniazid -ketoconazole -NSAIDS, medicines for pain and inflammation, like ibuprofen or naproxen -phenytoin -rifampin -toxoids -vaccines -warfarin This list may not describe all possible interactions. Give your health care provider a list of all the medicines, herbs, non-prescription drugs, or dietary supplements you use. Also tell them if you smoke, drink alcohol, or use illegal drugs. Some items may interact with your medicine. What should I watch for while using this medicine? Visit your doctor or health care professional for regular checks on your progress. If you are taking this medicine over a prolonged period, carry an identification card with your name and address, the type and dose of your medicine, and your doctor's name and address. This medicine may increase your risk of getting an infection. Tell your doctor or health care professional  if you are around anyone with measles or chickenpox, or if you develop sores or blisters  that do not heal properly. If you are going to have surgery, tell your doctor or health care professional that you have taken this medicine within the last twelve months. Ask your doctor or health care professional about your diet. You may need to lower the amount of salt you eat. This medicine may affect blood sugar levels. If you have diabetes, check with your doctor or health care professional before you change your diet or the dose of your diabetic medicine. What side effects may I notice from receiving this medicine? Side effects that you should report to your doctor or health care professional as soon as possible: -allergic reactions like skin rash, itching or hives, swelling of the face, lips, or tongue -changes in emotions or moods -changes in vision -depressed mood -eye pain -fever or chills, cough, sore throat, pain or difficulty passing urine -increased thirst -swelling of ankles, feet Side effects that usually do not require medical attention (report to your doctor or health care professional if they continue or are bothersome): -confusion, excitement, restlessness -headache -nausea, vomiting -skin problems, acne, thin and shiny skin -trouble sleeping -weight gain This list may not describe all possible side effects. Call your doctor for medical advice about side effects. You may report side effects to FDA at 1-800-FDA-1088. Where should I keep my medicine? Keep out of the reach of children. Store at room temperature between 15 and 30 degrees C (59 and 86 degrees F). Protect from light. Keep container tightly closed. Throw away any unused medicine after the expiration date. NOTE: This sheet is a summary. It may not cover all possible information. If you have questions about this medicine, talk to your doctor, pharmacist, or health care provider.    2016, Elsevier/Gold Standard. (2011-04-14 10:57:14)  Cyclobenzaprine tablets What is this medicine? CYCLOBENZAPRINE (sye kloe  BEN za preen) is a muscle relaxer. It is used to treat muscle pain, spasms, and stiffness. This medicine may be used for other purposes; ask your health care provider or pharmacist if you have questions. What should I tell my health care provider before I take this medicine? They need to know if you have any of these conditions: -heart disease, irregular heartbeat, or previous heart attack -liver disease -thyroid problem -an unusual or allergic reaction to cyclobenzaprine, tricyclic antidepressants, lactose, other medicines, foods, dyes, or preservatives -pregnant or trying to get pregnant -breast-feeding How should I use this medicine? Take this medicine by mouth with a glass of water. Follow the directions on the prescription label. If this medicine upsets your stomach, take it with food or milk. Take your medicine at regular intervals. Do not take it more often than directed. Talk to your pediatrician regarding the use of this medicine in children. Special care may be needed. Overdosage: If you think you have taken too much of this medicine contact a poison control center or emergency room at once. NOTE: This medicine is only for you. Do not share this medicine with others. What if I miss a dose? If you miss a dose, take it as soon as you can. If it is almost time for your next dose, take only that dose. Do not take double or extra doses. What may interact with this medicine? Do not take this medicine with any of the following medications: -certain medicines for fungal infections like fluconazole, itraconazole, ketoconazole, posaconazole, voriconazole -cisapride -dofetilide -dronedarone -droperidol -flecainide -grepafloxacin -halofantrine -levomethadyl -  MAOIs like Carbex, Eldepryl, Marplan, Nardil, and Parnate -nilotinib -pimozide -probucol -sertindole -thioridazine -ziprasidone This medicine may also interact with the following medications: -abarelix -alcohol -certain  medicines for cancer -certain medicines for depression, anxiety, or psychotic disturbances -certain medicines for infection like alfuzosin, chloroquine, clarithromycin, levofloxacin, mefloquine, pentamidine, troleandomycin -certain medicines for an irregular heart beat -certain medicines used for sleep or numbness during surgery or procedure -contrast dyes -dolasetron -guanethidine -methadone -octreotide -ondansetron -other medicines that prolong the QT interval (cause an abnormal heart rhythm) -palonosetron -phenothiazines like chlorpromazine, mesoridazine, prochlorperazine, thioridazine -tramadol -vardenafil This list may not describe all possible interactions. Give your health care provider a list of all the medicines, herbs, non-prescription drugs, or dietary supplements you use. Also tell them if you smoke, drink alcohol, or use illegal drugs. Some items may interact with your medicine. What should I watch for while using this medicine? Check with your doctor or health care professional if your condition does not improve within 1 to 3 weeks. You may get drowsy or dizzy when you first start taking the medicine or change doses. Do not drive, use machinery, or do anything that may be dangerous until you know how the medicine affects you. Stand or sit up slowly. Your mouth may get dry. Drinking water, chewing sugarless gum, or sucking on hard candy may help. What side effects may I notice from receiving this medicine? Side effects that you should report to your doctor or health care professional as soon as possible: -allergic reactions like skin rash, itching or hives, swelling of the face, lips, or tongue -chest pain -fast heartbeat -hallucinations -seizures -vomiting Side effects that usually do not require medical attention (report to your doctor or health care professional if they continue or are bothersome): -headache This list may not describe all possible side effects. Call  your doctor for medical advice about side effects. You may report side effects to FDA at 1-800-FDA-1088. Where should I keep my medicine? Keep out of the reach of children. Store at room temperature between 15 and 30 degrees C (59 and 86 degrees F). Keep container tightly closed. Throw away any unused medicine after the expiration date. NOTE: This sheet is a summary. It may not cover all possible information. If you have questions about this medicine, talk to your doctor, pharmacist, or health care provider.    2016, Elsevier/Gold Standard. (2013-03-26 12:48:19)

## 2016-01-18 ENCOUNTER — Telehealth: Payer: Self-pay | Admitting: Family Medicine

## 2016-01-18 ENCOUNTER — Ambulatory Visit: Payer: Self-pay | Admitting: Family Medicine

## 2016-01-18 MED ORDER — PREDNISONE 20 MG PO TABS
60.0000 mg | ORAL_TABLET | Freq: Every day | ORAL | Status: DC
Start: 2016-01-18 — End: 2016-01-18

## 2016-01-18 MED ORDER — PREDNISONE 20 MG PO TABS: 60.0000 mg | ORAL_TABLET | Freq: Every day | ORAL | Status: DC

## 2016-01-18 NOTE — Telephone Encounter (Signed)
Pt. Came into facility requesting a refill on predniSONE (DELTASONE) 20 MG tablet. Please f/u

## 2016-01-18 NOTE — Telephone Encounter (Signed)
Prednisone refilled

## 2016-01-19 ENCOUNTER — Ambulatory Visit: Payer: Self-pay | Admitting: Family Medicine

## 2016-01-29 ENCOUNTER — Ambulatory Visit: Payer: Self-pay | Attending: Family Medicine | Admitting: Family Medicine

## 2016-01-29 ENCOUNTER — Encounter: Payer: Self-pay | Admitting: Family Medicine

## 2016-01-29 VITALS — BP 135/84 | HR 87 | Temp 99.0°F | Resp 16 | Ht 63.0 in | Wt 163.0 lb

## 2016-01-29 DIAGNOSIS — G4700 Insomnia, unspecified: Secondary | ICD-10-CM

## 2016-01-29 DIAGNOSIS — Z79899 Other long term (current) drug therapy: Secondary | ICD-10-CM | POA: Insufficient documentation

## 2016-01-29 DIAGNOSIS — I7300 Raynaud's syndrome without gangrene: Secondary | ICD-10-CM | POA: Insufficient documentation

## 2016-01-29 DIAGNOSIS — Z114 Encounter for screening for human immunodeficiency virus [HIV]: Secondary | ICD-10-CM

## 2016-01-29 HISTORY — DX: Insomnia, unspecified: G47.00

## 2016-01-29 LAB — RHEUMATOID FACTOR: Rhuematoid fact SerPl-aCnc: 31 IU/mL — ABNORMAL HIGH (ref <=14)

## 2016-01-29 LAB — POCT URINALYSIS DIPSTICK
BILIRUBIN UA: NEGATIVE
Glucose, UA: NEGATIVE
Ketones, UA: NEGATIVE
Nitrite, UA: NEGATIVE
PH UA: 7
PROTEIN UA: 100
RBC UA: NEGATIVE
Spec Grav, UA: 1.015
Urobilinogen, UA: 1

## 2016-01-29 LAB — C-REACTIVE PROTEIN: CRP: 4 mg/dL — AB (ref <0.60)

## 2016-01-29 LAB — TSH: TSH: 0.87 m[IU]/L

## 2016-01-29 LAB — HIV ANTIBODY (ROUTINE TESTING W REFLEX): HIV: NONREACTIVE

## 2016-01-29 MED ORDER — MELATONIN 5 MG PO TABS: 5.0000 mg | ORAL_TABLET | Freq: Every day | ORAL | Status: DC

## 2016-01-29 MED ORDER — DICLOFENAC SODIUM 50 MG PO TBEC: 50.0000 mg | DELAYED_RELEASE_TABLET | Freq: Two times a day (BID) | ORAL | Status: DC

## 2016-01-29 MED ORDER — AMITRIPTYLINE HCL 25 MG PO TABS: 25.0000 mg | ORAL_TABLET | Freq: Every day | ORAL | Status: DC

## 2016-01-29 NOTE — Progress Notes (Signed)
Subjective:  Patient ID: [name] [name], female    DOB: [birth year]  Age: 20 y.o. MRN: [id number]  CC: Toe Pain   HPI [name] [name] presents with her mother for   1. Fatigue, joint pain and swelling: she works two jobs, she sleeps poorly. She does not exercise. No rash. Joint pain. Cold extremities. Dorsal foot pain.  No swelling. Prednisone helps with pains. She has not tried NSAID. She is concerned that she has an underlying condition that some form of autoimmune arthritis.    Social History  Substance Use Topics  . Smoking status: Never Smoker   . Smokeless tobacco: Not on file  . Alcohol Use: No   Outpatient Prescriptions Prior to Visit  Medication Sig Dispense Refill  . cyclobenzaprine (FLEXERIL) 10 MG tablet Take 1 tablet (10 mg total) by mouth 3 (three) times daily as needed for muscle spasms. 30 tablet 0  . ferrous sulfate 325 (65 FE) MG tablet Take 1 tablet (325 mg total) by mouth 2 (two) times daily. With 1/2 glass of orange juice 30 tablet 0  . predniSONE (DELTASONE) 20 MG tablet Take 3 tablets (60 mg total) by mouth daily. 15 tablet 0  . albuterol (PROVENTIL HFA;VENTOLIN HFA) 108 (90 BASE) MCG/ACT inhaler Inhale 1-2 puffs into the lungs every 6 (six) hours as needed for wheezing or shortness of breath. (Patient not taking: Reported on 01/29/2016) 1 Inhaler 0   No facility-administered medications prior to visit.    ROS Review of Systems  Constitutional: Negative for fever and chills.  Eyes: Negative for visual disturbance.  Respiratory: Negative for shortness of breath.   Cardiovascular: Negative for chest pain.  Gastrointestinal: Negative for abdominal pain and blood in stool.  Endocrine: Positive for cold intolerance.  Musculoskeletal: Negative for back pain and arthralgias.  Skin: Positive for color change. Negative for rash.  Allergic/Immunologic: Negative for immunocompromised state.  Neurological: Positive for numbness.  Hematological: Negative for adenopathy.  Does not bruise/bleed easily.  Psychiatric/Behavioral: Positive for sleep disturbance and dysphoric mood. Negative for suicidal ideas.    Objective:  BP 135/84 mmHg  Pulse 87  Temp(Src) 99 F (37.2 C) (Oral)  Resp 16  Ht 5\' 3"  (1.6 m)  Wt 163 lb (73.936 kg)  BMI 28.88 kg/m2  SpO2 99%  BP/Weight 01/29/2016 [id number]  Systolic BP [id number]  Diastolic BP 84 90 71  Wt. (Lbs) 163 - 170  BMI 28.88 - 30.12   Physical Exam  Constitutional: She is oriented to person, place, and time. She appears well-developed and well-nourished. No distress.  HENT:  Head: Normocephalic and atraumatic.  Cardiovascular: Normal rate, regular rhythm, normal heart sounds and intact distal pulses.   Pulmonary/Chest: Effort normal and breath sounds normal.  Musculoskeletal: She exhibits no edema.  Neurological: She is alert and oriented to person, place, and time.  Skin: Skin is warm and dry. No rash noted.  Psychiatric: She has a normal mood and affect.    Depression screen [facility] 2/9 01/29/2016 08/11/2015  Decreased Interest 3 0  Down, Depressed, Hopeless 1 0  PHQ - 2 Score 4 0  Altered sleeping 3 -  Tired, decreased energy 3 -  Change in appetite 3 -  Feeling bad or failure about yourself  0 -  Trouble concentrating 0 -  Moving slowly or fidgety/restless 0 -  Suicidal thoughts 0 -  PHQ-9 Score 13 -    GAD 7 : Generalized Anxiety Score 01/29/2016  Control/stop worrying 0  Worry  too much - different things 3  Trouble relaxing 3  Restless 0  Easily annoyed or irritable 3  Afraid - awful might happen 0   UA: small LE, 100 protien  Assessment & Plan:   Moesha was seen today for toe pain.  Diagnoses and all orders for this visit:  Raynaud's disease -     POCT urinalysis dipstick -     Sedimentation Rate -     C-reactive protein -     ANA -     TSH -     Rheumatoid factor -     Vitamin D, 25-hydroxy -     Anti-DNA antibody, double-stranded -     Sjogrens syndrome-A extractable  nuclear antibody -     Sjogrens syndrome-B extractable nuclear antibody -     Cyclic citrul peptide antibody, IgG -     diclofenac (VOLTAREN) 50 MG EC tablet; Take 1 tablet (50 mg total) by mouth 2 (two) times daily.  Insomnia -     Discontinue: amitriptyline (ELAVIL) 25 MG tablet; Take 1 tablet (25 mg total) by mouth at bedtime. -     Melatonin 5 MG TABS; Take 1 tablet (5 mg total) by mouth at bedtime.  Screening for HIV (human immunodeficiency virus) -     HIV antibody (with reflex)    No orders of the defined types were placed in this encounter.    Follow-up: No Follow-up on file.   Boykin Nearing MD

## 2016-01-29 NOTE — Patient Instructions (Addendum)
[name] [name] was seen today for toe pain.  Diagnoses and all orders for this visit:  Raynaud's disease -     POCT urinalysis dipstick -     Sedimentation Rate -     C-reactive protein -     ANA -     TSH -     Rheumatoid factor -     Vitamin D, 25-hydroxy -     Anti-DNA antibody, double-stranded -     Sjogrens syndrome-A extractable nuclear antibody -     Sjogrens syndrome-B extractable nuclear antibody -     Cyclic citrul peptide antibody, IgG -     diclofenac (VOLTAREN) 50 MG EC tablet; Take 1 tablet (50 mg total) by mouth 2 (two) times daily.  Insomnia -     Discontinue: amitriptyline (ELAVIL) 25 MG tablet; Take 1 tablet (25 mg total) by mouth at bedtime. -     Melatonin 5 MG TABS; Take 1 tablet (5 mg total) by mouth at bedtime.  Screening for HIV (human immunodeficiency virus) -     HIV antibody (with reflex)   Try melatonin 5 mg tablets nightly 30-45 minutes before sleep. This is OTC  You will be called with lab results  F/u in 4 weeks   Dr. Adrian Blackwater   Dr. Adrian Blackwater

## 2016-01-29 NOTE — Progress Notes (Signed)
Complaining of toe pain-bilateral Requesting lupus test  Pain scale #8 No tobacco user  No suicidal thoughts in the past two weeks

## 2016-01-30 LAB — VITAMIN D 25 HYDROXY (VIT D DEFICIENCY, FRACTURES): Vit D, 25-Hydroxy: 14 ng/mL — ABNORMAL LOW (ref 30–100)

## 2016-01-30 LAB — SEDIMENTATION RATE

## 2016-01-31 NOTE — Assessment & Plan Note (Signed)
Insomnia  Plan melatonin

## 2016-01-31 NOTE — Assessment & Plan Note (Signed)
Raynaud's disease  Labs to r/o autoimmune disease

## 2016-02-01 LAB — ANTI-NUCLEAR AB-TITER (ANA TITER): ANA Titer 1: >=1:1280 {titer} — ABNORMAL HIGH

## 2016-02-01 LAB — SJOGRENS SYNDROME-A EXTRACTABLE NUCLEAR ANTIBODY: SSA (Ro) (ENA) Antibody, IgG: >8 — ABNORMAL HIGH

## 2016-02-01 LAB — SJOGRENS SYNDROME-B EXTRACTABLE NUCLEAR ANTIBODY: SSB (La) (ENA) Antibody, IgG: <1

## 2016-02-01 LAB — CYCLIC CITRUL PEPTIDE ANTIBODY, IGG

## 2016-02-01 LAB — ANTI-DNA ANTIBODY, DOUBLE-STRANDED: ds DNA Ab: 31 IU/mL — ABNORMAL HIGH

## 2016-02-01 LAB — ANA: ANA: POSITIVE — AB

## 2016-02-02 ENCOUNTER — Telehealth: Payer: Self-pay | Admitting: Family Medicine

## 2016-02-02 DIAGNOSIS — E559 Vitamin D deficiency, unspecified: Secondary | ICD-10-CM

## 2016-02-02 DIAGNOSIS — M359 Systemic involvement of connective tissue, unspecified: Secondary | ICD-10-CM

## 2016-02-02 NOTE — Telephone Encounter (Signed)
Pt. Called stating that she was prescribed diclofenac (VOLTAREN) 50 MG EC tablet  And her PCP told her to call it was not working for her. Pt.  Stated her lips are swollen and  She is breaking out. Pt. Would like to get an Rx for Prednisone. Please f/u with pt.

## 2016-02-03 MED ORDER — VITAMIN D (ERGOCALCIFEROL) 1.25 MG (50000 UNIT) PO CAPS: 50000.0000 [IU] | ORAL_CAPSULE | ORAL | Status: DC

## 2016-02-03 MED ORDER — PREDNISONE 10 MG PO TABS: ORAL_TABLET | ORAL | Status: DC

## 2016-02-03 NOTE — Telephone Encounter (Signed)
Called patient twice from home. Left VM. Left labs results along with care plan given that the clinic will close soon and the patient is reporting lip swelling.  Results/Plan Patient with multiple positive antibodies, this could suggest rheumatoid arthritis, lupus or Sjogren's syndrome. I have placed a referral to rheumatology to help figure out which and to help determine a longterm treatment plan that is safer than longterm prednisone.  Prednisone re-ordered, start with 40 mg daily for 5 days, then 20 mg for 5 days, then 10 mg daily. Try to hold at 10 mg if 10 mg controls swelling.   Vit D deficiency, vit D replacement ordered  TSH normal Screening HIV negative  Advised patient call back to the office tomorrow if she has questions or sign on her to mychart to send a message.

## 2016-02-03 NOTE — Assessment & Plan Note (Addendum)
Patient with multiple positive antibodies, this could suggest rheumatoid arthritis, lupus, orSjogren's syndrome I have placed a referral to rheumatology to help figure out which and to help determine a longterm treatment plan that is safer than longterm prednisone.  Prednisone re-ordered, start with 40 mg daily for 5 days, then 20 mg for 5 days, then 10 mg daily. Try to hold at 10 mg if 10 mg controls swelling.   Vit D deficiency replaced TSH normal

## 2016-02-05 ENCOUNTER — Telehealth: Payer: Self-pay | Admitting: Family Medicine

## 2016-02-05 DIAGNOSIS — B373 Candidiasis of vulva and vagina: Secondary | ICD-10-CM

## 2016-02-05 DIAGNOSIS — B3731 Acute candidiasis of vulva and vagina: Secondary | ICD-10-CM

## 2016-02-05 MED ORDER — FLUCONAZOLE 150 MG PO TABS: 150.0000 mg | ORAL_TABLET | ORAL | Status: DC

## 2016-02-05 NOTE — Telephone Encounter (Signed)
Sent in diflucan for possible yeast infection in setting of prednisone use.

## 2016-02-05 NOTE — Telephone Encounter (Signed)
I cannot assist with this - will defer to Dr. Adrian Blackwater.

## 2016-02-05 NOTE — Telephone Encounter (Signed)
Patient believes she has a yeast infection ....she would like something to help with this   Please follow up

## 2016-02-18 ENCOUNTER — Telehealth: Payer: Self-pay | Admitting: Family Medicine

## 2016-02-18 DIAGNOSIS — M359 Systemic involvement of connective tissue, unspecified: Secondary | ICD-10-CM

## 2016-02-18 MED ORDER — PREDNISONE 20 MG PO TABS: 20.0000 mg | ORAL_TABLET | Freq: Every day | ORAL | Status: DC

## 2016-02-18 NOTE — Telephone Encounter (Signed)
I spoke to patient she is going to apply for bcbs and let me know so I can process her referral to rheumatology . She ask me about the prednisone and I told her that the nurse will call her back .

## 2016-02-18 NOTE — Telephone Encounter (Signed)
Patient was instructed by doctor to call and give report on Prednisone. Patient stated that she had to take 5 prednisone a day to feel better for 4 to 5 days. She also took advil to help with pain.  She doesn't feel that 10 mg is effective. Patient needs refill on prednisone. Please follow up.

## 2016-02-18 NOTE — Telephone Encounter (Signed)
50 mg of prednisone is too much if taken for longer than 5 days Try 20 mg daily  She desperately needs to see Rheumatology She needs to call back to [facility] regarding which option she would like to choose ? Is [facility] or [facility] an option?

## 2016-03-01 ENCOUNTER — Ambulatory Visit: Payer: Self-pay | Attending: Family Medicine

## 2016-03-01 DIAGNOSIS — Z3042 Encounter for surveillance of injectable contraceptive: Secondary | ICD-10-CM

## 2016-03-01 MED ORDER — MEDROXYPROGESTERONE ACETATE 150 MG/ML IM SUSP
150.0000 mg | Freq: Once | INTRAMUSCULAR | Status: AC
  Administered 2016-03-01: 150 mg via INTRAMUSCULAR

## 2016-03-01 NOTE — Progress Notes (Signed)
Patient here today for Depo Provera.  Was due to receive  Depo May 4-18.  Urine pregnancy done today per protocol and is negative.  Depo Provera 150 mg given in [place].  Due for next injection Sept 5, 2017 to sept 19, 2017.  Reminder card given to patient. Priscille Heidelberg, RN, BSN

## 2016-03-25 ENCOUNTER — Other Ambulatory Visit: Payer: Self-pay | Admitting: Family Medicine

## 2016-03-28 ENCOUNTER — Other Ambulatory Visit: Payer: Self-pay | Admitting: Family Medicine

## 2016-04-23 ENCOUNTER — Other Ambulatory Visit: Payer: Self-pay | Admitting: Family Medicine

## 2016-05-11 ENCOUNTER — Ambulatory Visit (HOSPITAL_COMMUNITY)
Admission: RE | Admit: 2016-05-11 | Discharge: 2016-05-11 | Disposition: A | Discharge status: Home / Self Care | Payer: No Typology Code available for payment source | Source: Ambulatory Visit | Attending: Rheumatology | Admitting: Rheumatology

## 2016-05-11 ENCOUNTER — Ambulatory Visit (HOSPITAL_COMMUNITY)
Admission: RE | Admit: 2016-05-11 | Discharge: 2016-05-11 | Disposition: A | Discharge status: Home / Self Care | Payer: No Typology Code available for payment source | Source: Ambulatory Visit | Attending: Interventional Radiology | Admitting: Interventional Radiology

## 2016-05-11 ENCOUNTER — Other Ambulatory Visit (HOSPITAL_COMMUNITY): Payer: Self-pay | Admitting: Interventional Radiology

## 2016-05-11 ENCOUNTER — Other Ambulatory Visit (HOSPITAL_COMMUNITY): Payer: Self-pay | Admitting: Rheumatology

## 2016-05-11 DIAGNOSIS — Z5181 Encounter for therapeutic drug level monitoring: Secondary | ICD-10-CM | POA: Insufficient documentation

## 2016-05-11 DIAGNOSIS — Z79899 Other long term (current) drug therapy: Secondary | ICD-10-CM

## 2016-05-11 DIAGNOSIS — T451X5D Adverse effect of antineoplastic and immunosuppressive drugs, subsequent encounter: Secondary | ICD-10-CM

## 2016-05-11 DIAGNOSIS — Z01818 Encounter for other preprocedural examination: Secondary | ICD-10-CM | POA: Diagnosis not present

## 2016-05-13 ENCOUNTER — Ambulatory Visit: Payer: No Typology Code available for payment source | Attending: Family Medicine

## 2016-05-13 DIAGNOSIS — Z418 Encounter for other procedures for purposes other than remedying health state: Secondary | ICD-10-CM

## 2016-05-13 DIAGNOSIS — Z299 Encounter for prophylactic measures, unspecified: Secondary | ICD-10-CM

## 2016-05-13 DIAGNOSIS — Z23 Encounter for immunization: Secondary | ICD-10-CM | POA: Diagnosis not present

## 2016-05-13 NOTE — Patient Instructions (Signed)
pmeumo 13 vaccine only

## 2016-05-13 NOTE — Progress Notes (Signed)
Patient here for Pneumovax 13 vaccine.

## 2016-06-11 ENCOUNTER — Emergency Department (HOSPITAL_COMMUNITY)
Admission: EM | Admit: 2016-06-11 | Discharge: 2016-06-11 | Disposition: A | Discharge status: Home / Self Care | Payer: PRIVATE HEALTH INSURANCE | Attending: Emergency Medicine | Admitting: Emergency Medicine

## 2016-06-11 ENCOUNTER — Encounter (HOSPITAL_COMMUNITY): Payer: Self-pay

## 2016-06-11 DIAGNOSIS — Z79899 Other long term (current) drug therapy: Secondary | ICD-10-CM | POA: Insufficient documentation

## 2016-06-11 DIAGNOSIS — L259 Unspecified contact dermatitis, unspecified cause: Secondary | ICD-10-CM | POA: Insufficient documentation

## 2016-06-11 MED ORDER — HYDROCORTISONE 1 % EX OINT: 1.0000 "application " | TOPICAL_OINTMENT | Freq: Two times a day (BID) | CUTANEOUS | 0 refills | Status: DC

## 2016-06-11 NOTE — ED Triage Notes (Signed)
Pt with rash on rt face and neck.  Began yesterday.  Neck itches and is painful.

## 2016-06-11 NOTE — ED Provider Notes (Signed)
[facility] DEPT Provider Note   CSN: [id number] Arrival date & time: 06/11/16  [id number]     History   Chief Complaint Chief Complaint  Patient presents with  . Rash    HPI [name] [name] is a 20 y.o. female.  HPI Patient reports that she rest her right temporal region right lateral neck with the past 24 hours.  She's not tried any medication for this.  Her symptoms are mild in severity.  No redness or fevers.  She suspects it may be related to poison ivy exposure.  No other family members with rash.   Past Medical History:  Diagnosis Date  . Raynaud disease     Patient Active Problem List   Diagnosis Date Noted  . Autoimmune disease ([place]) 02/03/2016  . Insomnia 01/29/2016  . Depo-Provera contraceptive status 08/11/2015  . Raynaud's disease 08/11/2015  . Seasonal allergies 08/11/2015  . IDA (iron deficiency anemia) 08/11/2015    Past Surgical History:  Procedure Laterality Date  . FRACTURE SURGERY    . TONSILLECTOMY      OB History    No data available       Home Medications    Prior to Admission medications   Medication Sig Start Date End Date Taking? Authorizing Provider  amitriptyline (ELAVIL) 25 MG tablet TAKE 1 TABLET BY MOUTH DAILY 04/25/16   Josalyn Funches, MD  amLODipine (NORVASC) 5 MG tablet Take 5 mg by mouth daily.    Historical Provider, MD  cyclobenzaprine (FLEXERIL) 10 MG tablet Take 1 tablet (10 mg total) by mouth 3 (three) times daily as needed for muscle spasms. [id number]   Delora Fuel, MD  ferrous sulfate 325 (65 FE) MG tablet Take 1 tablet (325 mg total) by mouth 2 (two) times daily. With 1/2 glass of orange juice 12/15/15   Davonna Belling, MD  fluconazole (DIFLUCAN) 150 MG tablet Take 1 tablet (150 mg total) by mouth every 3 (three) days. 02/05/16   Josalyn Funches, MD  hydrocortisone 1 % ointment Apply 1 application topically 2 (two) times daily. 06/11/16   Jola Schmidt, MD  Melatonin 5 MG TABS Take 1 tablet (5 mg total) by mouth at bedtime.  01/29/16   Josalyn Funches, MD  predniSONE (DELTASONE) 20 MG tablet TAKE 1 TABLET (20 MG TOTAL) BY MOUTH DAILY WITH BREAKFAST. 03/28/16   Josalyn Funches, MD  Vitamin D, Ergocalciferol, (DRISDOL) 50000 units CAPS capsule Take 1 capsule (50,000 Units total) by mouth every 7 (seven) days. For 8 weeks 02/03/16   Boykin Nearing, MD    Family History Family History  Problem Relation Age of Onset  . Aneurysm Mother   . Stroke Sister     Social History Social History  Substance Use Topics  . Smoking status: Never Smoker  . Smokeless tobacco: Never Used  . Alcohol use No     Allergies   Review of patient's allergies indicates no known allergies.   Review of Systems Review of Systems  All other systems reviewed and are negative.    Physical Exam Updated Vital Signs BP (!) 143/106 (BP Location: Left Arm)   Pulse 92   Temp 98.3 F (36.8 C) (Oral)   Resp 18   SpO2 100%   Physical Exam  Constitutional: She is oriented to person, place, and time. She appears well-developed and well-nourished.  HENT:  Head: Normocephalic.  Eyes: EOM are normal.  Neck: Normal range of motion.  Pulmonary/Chest: Effort normal.  Abdominal: She exhibits no distension.  Musculoskeletal: Normal range of  motion.  Neurological: She is alert and oriented to person, place, and time.  Skin:  Papular rash of right temporal region and right lateral neck without erythema  Psychiatric: She has a normal mood and affect.  Nursing note and vitals reviewed.    ED Treatments / Results  Labs (all labs ordered are listed, but only abnormal results are displayed) Labs Reviewed - No data to display  EKG  EKG Interpretation None       Radiology No results found.  Procedures Procedures (including critical care time)  Medications Ordered in ED Medications - No data to display   Initial Impression / Assessment and Plan / ED Course  I have reviewed the triage vital signs and the nursing  notes.  Pertinent labs & imaging results that were available during my care of the patient were reviewed by me and considered in my medical decision making (see chart for details).  Clinical Course    Contact dermatitis.  Home with hydrocortisone cream, recommendations for Benadryl.  No secondary signs of infection.  Final Clinical Impressions(s) / ED Diagnoses   Final diagnoses:  Contact dermatitis    New Prescriptions New Prescriptions   HYDROCORTISONE 1 % OINTMENT    Apply 1 application topically 2 (two) times daily.     Jola Schmidt, MD 06/11/16 [phone]

## 2016-06-11 NOTE — ED Notes (Signed)
Pt has high bp.  Pt has not taken her bp meds today.

## 2016-06-12 ENCOUNTER — Emergency Department (HOSPITAL_COMMUNITY)
Admission: EM | Admit: 2016-06-12 | Discharge: 2016-06-12 | Disposition: A | Discharge status: Home / Self Care | Payer: PRIVATE HEALTH INSURANCE | Attending: Emergency Medicine | Admitting: Emergency Medicine

## 2016-06-12 ENCOUNTER — Encounter (HOSPITAL_COMMUNITY): Payer: Self-pay | Admitting: Emergency Medicine

## 2016-06-12 DIAGNOSIS — L2389 Allergic contact dermatitis due to other agents: Secondary | ICD-10-CM | POA: Insufficient documentation

## 2016-06-12 DIAGNOSIS — L258 Unspecified contact dermatitis due to other agents: Secondary | ICD-10-CM

## 2016-06-12 MED ORDER — PREDNISONE 20 MG PO TABS: ORAL_TABLET | ORAL | 0 refills | Status: DC

## 2016-06-12 MED ORDER — TRIAMCINOLONE ACETONIDE 0.025 % EX OINT: 1.0000 "application " | TOPICAL_OINTMENT | Freq: Two times a day (BID) | CUTANEOUS | 0 refills | Status: DC

## 2016-06-12 NOTE — Discharge Instructions (Signed)
Please discontinue your normal dose of prednisone while you are taking what I have prescribed. You may then return to your normal dose per Dr. Adrian Blackwater. Apply the kenalog ointment to your neck and ear, or you may continue to use the cortisone.  This is a cell mediated reaction and these types of reactions can last for almost a month sometimes. Please take the prednisone does as prescribed. It may spread during this time. If you are having worsening problems including difficulty breathing or throat swelling.

## 2016-06-12 NOTE — ED Provider Notes (Signed)
[facility] DEPT Provider Note   CSN: [id number] Arrival date & time: 06/12/16  0545     History   Chief Complaint Chief Complaint  Patient presents with  . Rash    HPI [name] [name] is a 20 y.o. female who returns to the ED within 24 hours for worsening rash. She has a past medical history of lupus and is currently on 20 mg of prednisone daily. [facility] Adrian Blackwater She was seen last night and diagnosed with contact dermatitis. Denies contacts with similar,changes in lotions/soaps/detergents, exposure to animal or plant irritants, and denies purulent discharge.The rash began on her face but has spread to the R face and And year. He is also on the left side of her neck. She states it is extremely itchy. She denies any exposure to poison ivy, however, she does have a poodle who runs around outside and didn't cut his hair just before the break out. Denies fevers, chills, myalgias, arthralgias. Denies DOE, SOB, chest tightness or pressure, radiation to left arm, jaw or back, or diaphoresis. Denies dysuria, flank pain, suprapubic pain, frequency, urgency, or hematuria. Denies headaches, light headedness, weakness, visual disturbances. Denies abdominal pain, nausea, vomiting, diarrhea or constipation.   HPI  Past Medical History:  Diagnosis Date  . Raynaud disease     Patient Active Problem List   Diagnosis Date Noted  . Autoimmune disease ([place]) 02/03/2016  . Insomnia 01/29/2016  . Depo-Provera contraceptive status 08/11/2015  . Raynaud's disease 08/11/2015  . Seasonal allergies 08/11/2015  . IDA (iron deficiency anemia) 08/11/2015    Past Surgical History:  Procedure Laterality Date  . FRACTURE SURGERY    . TONSILLECTOMY      OB History    No data available       Home Medications    Prior to Admission medications   Medication Sig Start Date End Date Taking? Authorizing Provider  amitriptyline (ELAVIL) 25 MG  tablet TAKE 1 TABLET BY MOUTH DAILY 04/25/16   Josalyn Funches, MD  amLODipine (NORVASC) 5 MG tablet Take 5 mg by mouth daily.    Historical Provider, MD  cyclobenzaprine (FLEXERIL) 10 MG tablet Take 1 tablet (10 mg total) by mouth 3 (three) times daily as needed for muscle spasms. 99991111   Delora Fuel, MD  ferrous sulfate 325 (65 FE) MG tablet Take 1 tablet (325 mg total) by mouth 2 (two) times daily. With 1/2 glass of orange juice 12/15/15   Davonna Belling, MD  fluconazole (DIFLUCAN) 150 MG tablet Take 1 tablet (150 mg total) by mouth every 3 (three) days. 02/05/16   Josalyn Funches, MD  hydrocortisone 1 % ointment Apply 1 application topically 2 (two) times daily. 06/11/16   Jola Schmidt, MD  Melatonin 5 MG TABS Take 1 tablet (5 mg total) by mouth at bedtime. 01/29/16   Josalyn Funches, MD  predniSONE (DELTASONE) 20 MG tablet TAKE 1 TABLET (20 MG TOTAL) BY MOUTH DAILY WITH BREAKFAST. 03/28/16   Josalyn Funches, MD  predniSONE (DELTASONE) 20 MG tablet 3 tabs po daily x 3 days, then 2 tabs x 3 days, then 1.5 tabs x 3 days, then 1 tab x 3 days 06/12/16   Margarita Mail, PA-C  triamcinolone (KENALOG) 0.025 % ointment Apply 1 application topically 2 (two) times daily. Apply to the affected area, do not apply to face. 06/12/16   Margarita Mail, PA-C  Vitamin D, Ergocalciferol, (DRISDOL) 50000 units CAPS capsule Take 1 capsule (50,000 Units  total) by mouth every 7 (seven) days. For 8 weeks 02/03/16   Boykin Nearing, MD    Family History Family History  Problem Relation Age of Onset  . Aneurysm Mother   . Stroke Sister     Social History Social History  Substance Use Topics  . Smoking status: Never Smoker  . Smokeless tobacco: Never Used  . Alcohol use No     Allergies   Review of patient's allergies indicates no known allergies.   Review of Systems Review of Systems  Constitutional: Negative for chills and fever.  Skin: Positive for rash.     Physical Exam Updated Vital Signs BP (!)  132/105   Physical Exam  Constitutional: She is oriented to person, place, and time. She appears well-developed and well-nourished. No distress.  HENT:  Head: Normocephalic and atraumatic.  tms normal BL  Eyes: Conjunctivae are normal. No scleral icterus.  Neck: Normal range of motion.  Cardiovascular: Normal rate, regular rhythm and normal heart sounds.  Exam reveals no gallop and no friction rub.   No murmur heard. Pulmonary/Chest: Effort normal and breath sounds normal. No respiratory distress.  Abdominal: Soft. Bowel sounds are normal. She exhibits no distension and no mass. There is no tenderness. There is no guarding.  Neurological: She is alert and oriented to person, place, and time.  Skin: Skin is warm and dry. She is not diaphoretic.  Confluent, erythematous, papulovesicular rash on the right and left sides of the neck, neck, right year, with a singular streak along the right temporal region of the face.   Nursing note and vitals reviewed.    ED Treatments / Results  Labs (all labs ordered are listed, but only abnormal results are displayed) Labs Reviewed - No data to display  EKG  EKG Interpretation None       Radiology No results found.  Procedures Procedures (including critical care time)  Medications Ordered in ED Medications - No data to display   Initial Impression / Assessment and Plan / ED Course  I have reviewed the triage vital signs and the nursing notes.  Pertinent labs & imaging results that were available during my care of the patient were reviewed by me and considered in my medical decision making (see chart for details).  Clinical Course   Patient with contact dermatitis. No involvement elsewhere except for the face and neck currently. I doubt any other etiology of infection including shingles. Patient is currently on 20 mg of prednisone daily. Have discussed the course of a type 4,Cell-mediated reaction and how long it can take. Patient is  to increase her prednisone to 60 mg and will taper back down to her regular dose over about 2 weeks. I have also ordered Kenalog ointment for her neck and ear, not to be applied to the face. She may use Benadryl for severe itching.  Final Clinical Impressions(s) / ED Diagnoses   Final diagnoses:  Unspecified contact dermatitis due to other agents    New Prescriptions New Prescriptions   PREDNISONE (DELTASONE) 20 MG TABLET    3 tabs po daily x 3 days, then 2 tabs x 3 days, then 1.5 tabs x 3 days, then 1 tab x 3 days   TRIAMCINOLONE (KENALOG) 0.025 % OINTMENT    Apply 1 application topically 2 (two) times daily. Apply to the affected area, do not apply to face.     Margarita Mail, PA-C 06/12/16 [place], MD 06/16/16 [phone]

## 2016-06-12 NOTE — ED Triage Notes (Signed)
Pt in from home with c/o R neck rash/itching x 3 days. Pt states she thought it was poison oak, was treating it with hydrocortisone cream, but no relief. States she thinks it may be scabies, denies any contact with anyone with known scabies

## 2016-07-23 ENCOUNTER — Other Ambulatory Visit: Payer: Self-pay | Admitting: Family Medicine

## 2016-09-01 ENCOUNTER — Ambulatory Visit: Payer: Self-pay | Admitting: Rheumatology

## 2016-09-01 ENCOUNTER — Other Ambulatory Visit: Payer: Self-pay | Admitting: Rheumatology

## 2016-09-01 NOTE — Telephone Encounter (Signed)
Attempted to contact the patient and left message for patient to call the office. Patient needs vitamin D rechecked.

## 2016-09-02 NOTE — Telephone Encounter (Signed)
Patient will come in Wednesday 09/07/16 for lab work.

## 2016-09-19 ENCOUNTER — Ambulatory Visit (INDEPENDENT_AMBULATORY_CARE_PROVIDER_SITE_OTHER): Payer: BLUE CROSS/BLUE SHIELD | Admitting: Rheumatology

## 2016-09-19 ENCOUNTER — Encounter: Payer: Self-pay | Admitting: Rheumatology

## 2016-09-19 VITALS — BP 130/88 | HR 86 | Resp 16 | Ht 63.0 in | Wt 183.0 lb

## 2016-09-19 DIAGNOSIS — Z7952 Long term (current) use of systemic steroids: Secondary | ICD-10-CM | POA: Diagnosis not present

## 2016-09-19 DIAGNOSIS — M329 Systemic lupus erythematosus, unspecified: Secondary | ICD-10-CM | POA: Insufficient documentation

## 2016-09-19 DIAGNOSIS — I7300 Raynaud's syndrome without gangrene: Secondary | ICD-10-CM | POA: Diagnosis not present

## 2016-09-19 DIAGNOSIS — M3219 Other organ or system involvement in systemic lupus erythematosus: Secondary | ICD-10-CM

## 2016-09-19 DIAGNOSIS — Z79899 Other long term (current) drug therapy: Secondary | ICD-10-CM | POA: Insufficient documentation

## 2016-09-19 HISTORY — DX: Systemic lupus erythematosus, unspecified: M32.9

## 2016-09-19 MED ORDER — PREDNISONE 5 MG PO TABS: ORAL_TABLET | ORAL | 0 refills | Status: DC

## 2016-09-19 NOTE — Progress Notes (Signed)
Office Visit Note  Patient: [name] [name]             Date of Birth: 1996/08/18           MRN: [id number]             PCP: No PCP Per Patient Referring: Boykin Nearing, MD Visit Date: 09/19/2016 Occupation: @GUAROCC @    Subjective:  Follow-up (has not tapered prednisone, is using 20 mg currently ) and Medication Problem (patient states she was told by Dr Estanislado Pandy to [facility] for rash, there is no record of this in her chart )   History of Present Illness: [name] [name] is a 21 y.o. female  Last seen in our office on 06/02/2016. And is doing well with her lupus overall. No flare of her nasal ulcers fatigue or anemia or arthritis. She does have ongoing Raynaud's.  She has a history of having rash from Plaquenil but not nothing is noted in the chart. So the medication that Dr. Estanislado Pandy prescribed for her September 21 is no longer being taken. She states that she had swelling of the lips and was considered to be from Plaquenil use.  Patient also reports that the prednisone taper that she was supposed to be doing is not being done. She ran out of medication and ended up going back to [facility]. According to the patient they gave her 20 mg of prednisone. I explained to the patient that instead of tapering downwards she had actually gone in the higher direction which we want to try to avoid when possible. According to the patient she restarted her prednisone 20 mg on 09/04/2016.  She has no other complaint at this time.  Activities of Daily Living:  Patient reports morning stiffness for 30 minutes.   Patient Denies nocturnal pain.  Difficulty dressing/grooming: Denies Difficulty climbing stairs: Denies Difficulty getting out of chair: Denies Difficulty using hands for taps, buttons, cutlery, and/or writing: Denies   Review of Systems  Constitutional: Negative for fatigue.  HENT: Negative for mouth sores and mouth dryness.   Eyes: Negative for dryness.    Respiratory: Negative for shortness of breath.   Gastrointestinal: Negative for constipation and diarrhea.  Musculoskeletal: Negative for myalgias and myalgias.  Skin: Negative for sensitivity to sunlight.  Psychiatric/Behavioral: Negative for decreased concentration and sleep disturbance.    PMFS History:  Patient Active Problem List   Diagnosis Date Noted  . SLE (systemic lupus erythematosus) ([place]) 09/19/2016  . High risk medication use 09/19/2016  . Autoimmune disease ([place]) 02/03/2016  . Insomnia 01/29/2016  . Depo-Provera contraceptive status 08/11/2015  . Raynaud's disease 08/11/2015  . Seasonal allergies 08/11/2015  . IDA (iron deficiency anemia) 08/11/2015    Past Medical History:  Diagnosis Date  . Raynaud disease     Family History  Problem Relation Age of Onset  . Aneurysm Mother   . Stroke Sister    Past Surgical History:  Procedure Laterality Date  . FRACTURE SURGERY    . TONSILLECTOMY     Social History   Social History Narrative  . No narrative on file     Objective: Vital Signs: BP 130/88   Pulse 86   Resp 16   Ht 5\' 3"  (1.6 m)   Wt 183 lb (83 kg)   BMI 32.42 kg/m    Physical Exam  Constitutional: She is oriented to person, place, and time. She appears well-developed and well-nourished.  HENT:  Head: Normocephalic and atraumatic.  Eyes: EOM are normal. Pupils are equal, round, and reactive to light.  Cardiovascular: Normal rate, regular rhythm and normal heart sounds.  Exam reveals no gallop and no friction rub.   No murmur heard. Pulmonary/Chest: Effort normal and breath sounds normal. She has no wheezes. She has no rales.  Abdominal: Soft. Bowel sounds are normal. She exhibits no distension. There is no tenderness. There is no guarding. No hernia.  Musculoskeletal: Normal range of motion. She exhibits no edema, tenderness or deformity.  Lymphadenopathy:    She has no cervical adenopathy.  Neurological: She is alert and oriented to  person, place, and time. Coordination normal.  Skin: Skin is warm and dry. Capillary refill takes less than 2 seconds. No rash noted.  Psychiatric: She has a normal mood and affect. Her behavior is normal.  Nursing note and vitals reviewed.    Musculoskeletal Exam:  Full range of motion of all joints Grip strength is equal and strong bilaterally For myalgia tender points are all absent  CDAI Exam: CDAI Homunculus Exam:   Joint Counts:  CDAI Tender Joint count: 0 CDAI Swollen Joint count: 0  Global Assessments:  Patient Global Assessment: 0 Provider Global Assessment: 0    Investigation: Findings:  August 2017:  CBC showed hemoglobin 11.6.  Comprehensive metabolic panel showed potassium 3.4.  CK was 29.  Beta-2 lupus anticoagulant and anticardiolipin antibodies were negative.  RNP was positive.  Smith and SCL-70 was negative.  Hepatitis panel, immunoglobulins, SPEP and TB Gold were negative.  Her eye exam was normal per patient in August.  EKG was also normal per patient in August.     She has been on high dose prednisone for more than a year and we have been trying to taper it.      Imaging: No results found.  Speciality Comments: No specialty comments available.    Procedures:  No procedures performed Allergies: Patient has no known allergies.   Assessment / Plan:     Visit Diagnoses: Raynaud's disease without gangrene  Other systemic lupus erythematosus with other organ involvement ([place])  High risk medication use  Long term (current) use of systemic steroids  Plan: #1: Systemic lupus erythematosus. Stable. We would like to use DMARD's but patient was allergic to Plaquenil so we had to discontinue that.  #2: Long-term use of prednisone for greater than 1 year. On the last visit we wanted to start her on a taper but unfortunately when she ran out of her medication she [facility] and they put her on 20 mg of prednisone. With the patient on how  to taper and when to call us so we can give her proper medical advice. Patient understands and is agreeable. I gave her printed hand written prednisone taper schedule. Please find it in patient's chart.  # A: Currently on prednisone 20 mg since 09/04/2016 # B: Starting 09/20/2016: She'll be on prednisone 5 mg: 3 pills every morning for the rest of January; for February and March 2018 2 pills every morning # C: Return back early to mid March 2018 so we can give more medication to continue the taper as follows # D: 7.5 mg for April and May; 5 mg for June and July; 4 mg for August; 3 mg for September; 2 mg for October; 1 mg for November; then stop. # E: Patient was reminded to call our office if she has any questions or concerns regarding her prednisone taper or any confusion rather than going  to another doctor. Patient understands and is agreeable  #3: Return to clinic mid-March as scheduled  #4: Consider low dose methotrexate since she cannot take Plaquenil. We will discussed this at her follow-up visit   Orders: No orders of the defined types were placed in this encounter.  Meds ordered this encounter  Medications  . predniSONE (DELTASONE) 5 MG tablet    Sig: 09/19/2016: Prednisone 5 mg, 3 pills every morning for rest of January; 2 pills every morning for the rest of February and for the rest of March. Dispense: 200 pills; return to clinic mid-March for remainder of taper with Dr Estanislado Pandy.    Dispense:  200 tablet    Refill:  0    Order Specific Question:   Supervising Provider    Answer:   Bo Merino [phone]    Face-to-face time spent with patient was 60 minutes. 50% of time was spent in counseling and coordination of care.  Follow-Up Instructions: Return in about 2 months (around 11/17/2016) for sle, long term prednisone, raynauds, arthtritis.   Eliezer Lofts, PA-C  Note - This record has been created using [employer].  Chart creation errors have been sought, but may  not always  have been located. Such creation errors do not reflect on  the standard of medical care.

## 2016-10-07 ENCOUNTER — Telehealth: Payer: Self-pay | Admitting: Rheumatology

## 2016-10-07 NOTE — Telephone Encounter (Signed)
Patient needs a physical ed note stating patient can participate in physical fitness and weight training since the patient has Lupus. Please fax [phone] attn: Pecolia Ades.

## 2016-10-10 ENCOUNTER — Encounter: Payer: Self-pay | Admitting: Rheumatology

## 2016-10-10 NOTE — Telephone Encounter (Signed)
Left message to advise patient letter has been sent and she may pick up a copy if she would like.

## 2016-10-10 NOTE — Telephone Encounter (Signed)
Seth Bake, I have written this letter and patient is able to participate in physical fitness/weight training as of today. Please fax the letter that I WROTE TO:  [phone] attn: Pecolia Ades

## 2016-10-11 ENCOUNTER — Telehealth: Payer: Self-pay | Admitting: Family Medicine

## 2016-10-11 NOTE — Telephone Encounter (Signed)
Patient called requesting an appt to get a Depo shot. Has not been seen by Dr Adrian Blackwater since 01/2016 so I advised that I would inquire with the nurse. Please f/u.

## 2016-10-11 NOTE — Telephone Encounter (Signed)
Yes,  She just needs to have a pregnancy test done before shot  Given

## 2016-10-11 NOTE — Telephone Encounter (Signed)
Pt last depo shot was given on 03/01/16 by kim. Can pt be put on schedule to receive her depo shot.

## 2016-10-12 ENCOUNTER — Telehealth: Payer: Self-pay

## 2016-10-12 NOTE — Telephone Encounter (Signed)
Pt was called and informed that she is able to get her depo shot she just needs to take a pregnancy test first.

## 2016-10-14 DIAGNOSIS — N898 Other specified noninflammatory disorders of vagina: Secondary | ICD-10-CM | POA: Diagnosis not present

## 2016-10-14 DIAGNOSIS — L292 Pruritus vulvae: Secondary | ICD-10-CM | POA: Diagnosis not present

## 2016-10-19 ENCOUNTER — Telehealth: Payer: Self-pay | Admitting: *Deleted

## 2016-10-19 MED ORDER — BACLOFEN 10 MG PO TABS: 10.0000 mg | ORAL_TABLET | Freq: Three times a day (TID) | ORAL | 0 refills | Status: DC

## 2016-10-19 NOTE — Telephone Encounter (Signed)
I call patient [name] [name] and we decided on prescribing baclofen for the patient.Please call him this medication for the patientBaclofen 10 mg1 pill up to 3 times a day when necessary muscle spasmsDispense 90 pills with no refills

## 2016-10-19 NOTE — Telephone Encounter (Signed)
Patient states she tapered done on the Prednisone to 10 mg on October 13, 2016. Patient states she is experiencing tightness in her muscles and stiffness. Patient states this started about two after she went down on her Prednisone. Patient would know what she should do from her to help with her stiffness and tightness. Patient states she does not really want to increase her prednisone, she would like to know what your other options are.

## 2016-10-19 NOTE — Telephone Encounter (Signed)
Rx for Baclofen sent into patient pharmacy.

## 2016-10-21 ENCOUNTER — Ambulatory Visit: Payer: BLUE CROSS/BLUE SHIELD | Attending: Internal Medicine | Admitting: *Deleted

## 2016-10-21 DIAGNOSIS — Z3042 Encounter for surveillance of injectable contraceptive: Secondary | ICD-10-CM

## 2016-10-21 DIAGNOSIS — Z3040 Encounter for surveillance of contraceptives, unspecified: Secondary | ICD-10-CM | POA: Diagnosis not present

## 2016-10-21 MED ORDER — MEDROXYPROGESTERONE ACETATE 150 MG/ML IM SUSP
150.0000 mg | Freq: Once | INTRAMUSCULAR | Status: AC
  Administered 2016-10-21: 150 mg via INTRAMUSCULAR

## 2016-10-21 NOTE — Progress Notes (Signed)
Patient here today for Depo Provera injection.  Depo given today February 9th, 2018.  Site unremarkable & patient tolerated injection.  Next injection due April 27 through Jan 14, 2017. Reminder card given.

## 2016-10-24 DIAGNOSIS — Z01419 Encounter for gynecological examination (general) (routine) without abnormal findings: Secondary | ICD-10-CM | POA: Diagnosis not present

## 2016-10-24 DIAGNOSIS — Z130 Encounter for screening for diseases of the blood and blood-forming organs and certain disorders involving the immune mechanism: Secondary | ICD-10-CM | POA: Diagnosis not present

## 2016-10-24 DIAGNOSIS — Z113 Encounter for screening for infections with a predominantly sexual mode of transmission: Secondary | ICD-10-CM | POA: Diagnosis not present

## 2016-10-24 DIAGNOSIS — Z6833 Body mass index (BMI) 33.0-33.9, adult: Secondary | ICD-10-CM | POA: Diagnosis not present

## 2016-10-24 DIAGNOSIS — Z1389 Encounter for screening for other disorder: Secondary | ICD-10-CM | POA: Diagnosis not present

## 2016-10-24 DIAGNOSIS — Z3009 Encounter for other general counseling and advice on contraception: Secondary | ICD-10-CM | POA: Diagnosis not present

## 2016-10-24 DIAGNOSIS — R8299 Other abnormal findings in urine: Secondary | ICD-10-CM | POA: Diagnosis not present

## 2016-10-26 ENCOUNTER — Encounter: Payer: Self-pay | Admitting: Physician Assistant

## 2016-10-26 ENCOUNTER — Ambulatory Visit: Payer: BLUE CROSS/BLUE SHIELD | Attending: Physician Assistant | Admitting: Physician Assistant

## 2016-10-26 ENCOUNTER — Telehealth: Payer: Self-pay | Admitting: *Deleted

## 2016-10-26 VITALS — BP 136/94 | HR 94 | Temp 98.9°F | Resp 16 | Wt 180.8 lb

## 2016-10-26 DIAGNOSIS — M25461 Effusion, right knee: Secondary | ICD-10-CM | POA: Insufficient documentation

## 2016-10-26 DIAGNOSIS — Z79899 Other long term (current) drug therapy: Secondary | ICD-10-CM | POA: Diagnosis not present

## 2016-10-26 DIAGNOSIS — M79675 Pain in left toe(s): Secondary | ICD-10-CM | POA: Insufficient documentation

## 2016-10-26 DIAGNOSIS — R6889 Other general symptoms and signs: Secondary | ICD-10-CM | POA: Diagnosis not present

## 2016-10-26 DIAGNOSIS — Z0001 Encounter for general adult medical examination with abnormal findings: Secondary | ICD-10-CM | POA: Diagnosis not present

## 2016-10-26 LAB — COMPREHENSIVE METABOLIC PANEL
ALBUMIN: 3.6 g/dL (ref 3.6–5.1)
ALT: 11 U/L (ref 6–29)
AST: 15 U/L (ref 10–30)
Alkaline Phosphatase: 58 U/L (ref 33–115)
BILIRUBIN TOTAL: 0.3 mg/dL (ref 0.2–1.2)
BUN: 7 mg/dL (ref 7–25)
CO2: 24 mmol/L (ref 20–31)
Calcium: 9 mg/dL (ref 8.6–10.2)
Chloride: 107 mmol/L (ref 98–110)
Creat: 0.64 mg/dL (ref 0.50–1.10)
Glucose, Bld: 82 mg/dL (ref 65–99)
Potassium: 3.7 mmol/L (ref 3.5–5.3)
Sodium: 141 mmol/L (ref 135–146)
TOTAL PROTEIN: 7.1 g/dL (ref 6.1–8.1)

## 2016-10-26 LAB — CBC WITH DIFFERENTIAL/PLATELET
BASOS PCT: 0 %
Basophils Absolute: 0 cells/uL (ref 0–200)
Eosinophils Absolute: 57 cells/uL (ref 15–500)
Eosinophils Relative: 1 %
HEMATOCRIT: 39 % (ref 35.0–45.0)
HEMOGLOBIN: 12.3 g/dL (ref 11.7–15.5)
LYMPHS ABS: 1026 {cells}/uL (ref 850–3900)
Lymphocytes Relative: 18 %
MCH: 22.8 pg — ABNORMAL LOW (ref 27.0–33.0)
MCHC: 31.5 g/dL — AB (ref 32.0–36.0)
MCV: 72.2 fL — ABNORMAL LOW (ref 80.0–100.0)
MONO ABS: 456 {cells}/uL (ref 200–950)
Monocytes Relative: 8 %
NEUTROS PCT: 73 %
Neutro Abs: 4161 cells/uL (ref 1500–7800)
Platelets: 422 10*3/uL — ABNORMAL HIGH (ref 140–400)
RBC: 5.4 MIL/uL — ABNORMAL HIGH (ref 3.80–5.10)
RDW: 18.8 % — ABNORMAL HIGH (ref 11.0–15.0)
WBC: 5.7 10*3/uL (ref 3.8–10.8)

## 2016-10-26 MED ORDER — DICLOFENAC SODIUM 75 MG PO TBEC: 75.0000 mg | DELAYED_RELEASE_TABLET | Freq: Two times a day (BID) | ORAL | 0 refills | Status: DC

## 2016-10-26 NOTE — Progress Notes (Signed)
Subjective:  Patient ID: [name] [name], female    DOB: 1996/08/15  Age: 21 y.o. MRN: [id number]  CC: Right knee pain/swelling and left hallux pain  HPI [name] [name] is a 21 y.o. female with a PMH of SLE presents with a one week history of right knee pain and left hallux MCP pain. Right knee pain is generalized above the patella. Left hallux MCP is not exquisitely tender but there is mild erythema on the medial aspect of joint. She works out two times per week to include lifting weights of approximately 40-50lbs with her legs. Pain worse with activity and better with rest. Does not endorse trauma, ecchymosis, inability to bear weight, weakness, radiculopathy, or limited range of motion.   Upon ROS, it was found she has mild RUQ abdominal pain that is worse with eating fatty foods. No nausea, vomiting, fever, chills, abnormal stools, or abdominal bloating.   Outpatient Medications Prior to Visit  Medication Sig Dispense Refill  . amitriptyline (ELAVIL) 25 MG tablet TAKE 1 TABLET BY MOUTH DAILY (Patient not taking: Reported on 09/19/2016) 30 tablet 2  . amLODipine (NORVASC) 5 MG tablet Take 5 mg by mouth daily.    . baclofen (LIORESAL) 10 MG tablet Take 1 tablet (10 mg total) by mouth 3 (three) times daily. 90 each 0  . cyclobenzaprine (FLEXERIL) 10 MG tablet Take 1 tablet (10 mg total) by mouth 3 (three) times daily as needed for muscle spasms. 30 tablet 0  . ferrous sulfate 325 (65 FE) MG tablet Take 1 tablet (325 mg total) by mouth 2 (two) times daily. With 1/2 glass of orange juice 30 tablet 0  . hydrocortisone 1 % ointment Apply 1 application topically 2 (two) times daily. 30 g 0  . Melatonin 5 MG TABS Take 1 tablet (5 mg total) by mouth at bedtime. 30 tablet 3  . predniSONE (DELTASONE) 20 MG tablet TAKE 1 TABLET (20 MG TOTAL) BY MOUTH DAILY WITH BREAKFAST. 30 tablet 1  . predniSONE (DELTASONE) 5 MG tablet 09/19/2016: Prednisone 5 mg, 3 pills every morning for rest of January; 2 pills every  morning for the rest of February and for the rest of March. Dispense: 200 pills; return to clinic mid-March for remainder of taper with Dr Estanislado Pandy. 200 tablet 0  . triamcinolone (KENALOG) 0.025 % ointment Apply 1 application topically 2 (two) times daily. Apply to the affected area, do not apply to face. (Patient not taking: Reported on 09/19/2016) 30 g 0  . Vitamin D, Ergocalciferol, (DRISDOL) 50000 units CAPS capsule Take 1 capsule (50,000 Units total) by mouth every 7 (seven) days. For 8 weeks 8 capsule 0   No facility-administered medications prior to visit.      ROS Review of Systems  Constitutional: Negative for chills, fever and malaise/fatigue.  Respiratory: Negative for shortness of breath.   Cardiovascular: Negative for chest pain.  Gastrointestinal: Positive for abdominal pain (RUQ). Negative for nausea and vomiting.  Genitourinary: Negative for hematuria.  Musculoskeletal: Positive for joint pain. Negative for myalgias.  Skin: Negative for rash.  Neurological: Negative for weakness and headaches.    Objective:  BP (!) 136/94 (BP Location: Left Arm, Patient Position: Sitting, Cuff Size: Normal)   Pulse 94   Temp 98.9 F (37.2 C) (Oral)   Resp 16   Wt 180 lb 12.8 oz (82 kg)   LMP  (Approximate)   BMI 32.03 kg/m   BP/Weight 10/26/2016 09/19/2016 [id number]  Systolic BP [id number]  Diastolic BP  94 88 88  Wt. (Lbs) 180.8 183 -  BMI 32.03 32.42 -      Physical Exam  Constitutional: She is oriented to person, place, and time.  Well developed, well nourished, NAD, polite  HENT:  Head: Normocephalic and atraumatic.  Eyes: No scleral icterus.  Neck: Normal range of motion. Neck supple. No thyromegaly present.  Cardiovascular: Normal rate, regular rhythm and normal heart sounds.   Pulmonary/Chest: Effort normal and breath sounds normal.  Abdominal: Soft. Bowel sounds are normal. There is tenderness (mild RUQ tenderness). There is no rebound and no guarding.  Liver edge  is firm, no hepatosplenomegaly.  Musculoskeletal: She exhibits tenderness (Right knee with TTP superior to the patella. No patellar tenderness. MCL and LCL without increased laxity, Anterior and Posterior drawer negative. Full aROM of right knee. PROM elicited pain at approximately 90 degrees of flexion. ). She exhibits no edema or deformity.  Left hallux MCP joint with mild erythema and very mild tenderness to palpation. Full aROM.   Neurological: She is alert and oriented to person, place, and time.  Skin: Skin is warm and dry. No rash noted. No erythema. No pallor.  Psychiatric: She has a normal mood and affect. Her behavior is normal. Thought content normal.  Vitals reviewed.    Assessment & Plan:   1. Effusion of right knee -Very small effusion. Will treat with NSAID. Pt on Prednisone 5mg  and I warned her of possible upset stomach with concurrent NSAID and steroid use. Advised to return if swelling persists or worsens. Note written for her to abstain from physical activity at school. - diclofenac (VOLTAREN) 75 MG EC tablet; Take 1 tablet (75 mg total) by mouth 2 (two) times daily.  Dispense: 30 tablet; Refill: 0  2. Pain of toe of left foot -Not felt to be gouty due to elicitation of only mild pain on palpation.  - diclofenac (VOLTAREN) 75 MG EC tablet; Take 1 tablet (75 mg total) by mouth 2 (two) times daily.  Dispense: 30 tablet; Refill: 0  3. Abnormal physical finding - Liver edge firm. Will order [place] with the indication of abdominal pain, this is also in the setting of SLE. - US Abdomen Limited RUQ; Future - CBC with Differential/Platelet - Comprehensive metabolic panel   Meds ordered this encounter  Medications  . diclofenac (VOLTAREN) 75 MG EC tablet    Sig: Take 1 tablet (75 mg total) by mouth 2 (two) times daily.    Dispense:  30 tablet    Refill:  0    Order Specific Question:   Supervising Provider    Answer:   Tresa Garter [id number]    Follow-up: Return if  symptoms worsen or fail to improve.   Clent Demark PA

## 2016-10-26 NOTE — Patient Instructions (Addendum)
We will call you with result of lab and ultrasound. Please refrain from participating in physical activities for one week (7 days). Return if symptoms persist or worsen.  Knee Effusion Introduction Knee effusion means that you have excess fluid in your knee joint. This can cause pain and swelling in your knee. This may make your knee more difficult to bend and move. That is because there is increased pain and pressure in the joint. If there is fluid in your knee, it often means that something is wrong inside your knee, such as severe arthritis, abnormal inflammation, or an infection. Another common cause of knee effusion is an injury to the knee muscles, ligaments, or cartilage. Follow these instructions at home:  Use crutches as directed by your health care provider.  Wear a knee brace as directed by your health care provider.  Apply ice to the swollen area:  Put ice in a plastic bag.  Place a towel between your skin and the bag.  Leave the ice on for 20 minutes, 2-3 times per day.  Keep your knee raised (elevated) when you are sitting or lying down.  Take medicines only as directed by your health care provider.  Do any rehabilitation or strengthening exercises as directed by your health care provider.  Rest your knee as directed by your health care provider. You may start doing your normal activities again when your health care provider approves.  Keep all follow-up visits as directed by your health care provider. This is important. Contact a health care provider if:  You have ongoing (persistent) pain in your knee. Get help right away if:  You have increased swelling or redness of your knee.  You have severe pain in your knee.  You have a fever. This information is not intended to replace advice given to you by your health care provider. Make sure you discuss any questions you have with your health care provider. Document Released: 11/19/2003 Document Revised: 02/04/2016  Document Reviewed: 04/14/2014  2017 [employer]vier

## 2016-10-26 NOTE — Telephone Encounter (Signed)
Appointment scheduled for Wednesday, February 21,2018 @11 :00. Pt should be NPO 6 hours prior to testing and should arrive at appointment at 10:45.  Left message to return call on voicemail.

## 2016-10-27 NOTE — Telephone Encounter (Signed)
Attempted to call patient to inform of appt, and lab results. Left message to call back

## 2016-10-28 ENCOUNTER — Telehealth: Payer: Self-pay | Admitting: *Deleted

## 2016-10-28 NOTE — Telephone Encounter (Signed)
Finish NSAID, if still in pain, return for reevaluation and possible Xray order.

## 2016-10-28 NOTE — Telephone Encounter (Signed)
Pt aware of  [place] appointment. She states she still has quiet a bit of foot pain however medication is helping her knee. Please advise.

## 2016-10-28 NOTE — Telephone Encounter (Signed)
Pt informed and verbalized understanding

## 2016-10-31 NOTE — Telephone Encounter (Signed)
Pt verified and aware.

## 2016-11-02 ENCOUNTER — Ambulatory Visit (HOSPITAL_COMMUNITY)
Admission: RE | Admit: 2016-11-02 | Discharge: 2016-11-02 | Disposition: A | Discharge status: Home / Self Care | Payer: BLUE CROSS/BLUE SHIELD | Source: Ambulatory Visit | Attending: Physician Assistant | Admitting: Physician Assistant

## 2016-11-02 ENCOUNTER — Telehealth: Payer: Self-pay | Admitting: Physician Assistant

## 2016-11-02 DIAGNOSIS — R935 Abnormal findings on diagnostic imaging of other abdominal regions, including retroperitoneum: Secondary | ICD-10-CM | POA: Diagnosis not present

## 2016-11-02 DIAGNOSIS — R6889 Other general symptoms and signs: Secondary | ICD-10-CM | POA: Insufficient documentation

## 2016-11-02 NOTE — Progress Notes (Signed)
I called patient and left message for her to call back to discuss [place] result. Please call her one more time to share [place] result. Please have her return to clinic if she is not well.

## 2016-11-03 NOTE — Telephone Encounter (Signed)
I discussed ultrasound results: all normal. I answered her questions to her satisfaction. Patient appreciative for the call.

## 2016-11-08 ENCOUNTER — Other Ambulatory Visit: Payer: Self-pay | Admitting: Family Medicine

## 2016-11-08 DIAGNOSIS — G4700 Insomnia, unspecified: Secondary | ICD-10-CM

## 2016-11-09 ENCOUNTER — Encounter: Payer: Self-pay | Admitting: Rheumatology

## 2016-11-09 ENCOUNTER — Telehealth: Payer: Self-pay | Admitting: *Deleted

## 2016-11-09 ENCOUNTER — Ambulatory Visit (INDEPENDENT_AMBULATORY_CARE_PROVIDER_SITE_OTHER): Payer: BLUE CROSS/BLUE SHIELD | Admitting: Rheumatology

## 2016-11-09 VITALS — BP 148/98 | HR 82 | Resp 14 | Ht 63.0 in | Wt 183.0 lb

## 2016-11-09 DIAGNOSIS — Z79899 Other long term (current) drug therapy: Secondary | ICD-10-CM | POA: Diagnosis not present

## 2016-11-09 DIAGNOSIS — R21 Rash and other nonspecific skin eruption: Secondary | ICD-10-CM

## 2016-11-09 DIAGNOSIS — G8929 Other chronic pain: Secondary | ICD-10-CM | POA: Diagnosis not present

## 2016-11-09 DIAGNOSIS — M545 Low back pain, unspecified: Secondary | ICD-10-CM

## 2016-11-09 DIAGNOSIS — I10 Essential (primary) hypertension: Secondary | ICD-10-CM

## 2016-11-09 DIAGNOSIS — M170 Bilateral primary osteoarthritis of knee: Secondary | ICD-10-CM | POA: Diagnosis not present

## 2016-11-09 DIAGNOSIS — I7300 Raynaud's syndrome without gangrene: Secondary | ICD-10-CM

## 2016-11-09 DIAGNOSIS — M359 Systemic involvement of connective tissue, unspecified: Secondary | ICD-10-CM

## 2016-11-09 DIAGNOSIS — M3219 Other organ or system involvement in systemic lupus erythematosus: Secondary | ICD-10-CM

## 2016-11-09 DIAGNOSIS — F329 Major depressive disorder, single episode, unspecified: Secondary | ICD-10-CM

## 2016-11-09 DIAGNOSIS — F5101 Primary insomnia: Secondary | ICD-10-CM | POA: Diagnosis not present

## 2016-11-09 DIAGNOSIS — E559 Vitamin D deficiency, unspecified: Secondary | ICD-10-CM

## 2016-11-09 HISTORY — DX: Vitamin D deficiency, unspecified: E55.9

## 2016-11-09 HISTORY — DX: Low back pain, unspecified: M54.50

## 2016-11-09 HISTORY — DX: Bilateral primary osteoarthritis of knee: M17.0

## 2016-11-09 MED ORDER — ESCITALOPRAM OXALATE 10 MG PO TABS: 10.0000 mg | ORAL_TABLET | Freq: Every day | ORAL | 1 refills | Status: DC

## 2016-11-09 MED ORDER — METHOTREXATE SODIUM CHEMO INJECTION 50 MG/2ML: INTRAMUSCULAR | 0 refills | Status: DC

## 2016-11-09 MED ORDER — "TUBERCULIN-ALLERGY SYRINGES 27G X 1/2"" 1 ML KIT": 1.0000 | PACK | 3 refills | Status: DC

## 2016-11-09 MED ORDER — FOLIC ACID 1 MG PO TABS: 2.0000 mg | ORAL_TABLET | Freq: Every day | ORAL | 3 refills | Status: DC

## 2016-11-09 NOTE — Telephone Encounter (Signed)
Patient states she is having a rash on the her hands, her arms and back of neck. Patient states it is small bumps. Patient states the muscles in her legs and arms are tight and her hands are swollen. Patient states this has been going on for approximately 6 days. Patient has been worked in for an appointment today.

## 2016-11-09 NOTE — Progress Notes (Signed)
Office Visit Note  Patient: [name] [name]             Date of Birth: 1996/08/23           MRN: [id number]             PCP: No PCP Per Patient Referring: No ref. provider found Visit Date: 11/09/2016 Occupation: @GUAROCC @    Subjective:  Rash   History of Present Illness: [name] [name] is a 21 y.o. female with history of systemic lupus. She stopped Plaquenil due to development of a rash. She's been on prednisone since the last visit she was initially on 20 mg a day which she gradually tapered and is down to 10 mg by mouth daily. A week ago she developed a rash on her lower back. She tried topical steroids which helped although she's having rash on her upper extremities. She's been having pain and swelling in her bilateral elbow joints knee joints and discomfort in her shoulder joints. She is having pain and swelling in her hands and is unable to open her hands completely. She's also having discomfort in her feet which she describes over MTP joints. She had Depo-Provera injection [facility]/2016 [facility].  Activities of Daily Living:  Patient reports morning stiffness for 10 minutes.   Patient Reports nocturnal pain.  Difficulty dressing/grooming: Denies Difficulty climbing stairs: Reports Difficulty getting out of chair: Reports Difficulty using hands for taps, buttons, cutlery, and/or writing: Reports   Review of Systems  Constitutional: Positive for fatigue. Negative for night sweats, weight gain, weight loss and weakness.  HENT: Positive for mouth dryness. Negative for mouth sores, trouble swallowing, trouble swallowing and nose dryness.   Eyes: Positive for dryness. Negative for pain, redness and visual disturbance.  Respiratory: Negative for cough, shortness of breath and difficulty breathing.   Cardiovascular: Negative for chest pain, palpitations, hypertension, irregular heartbeat and swelling in legs/feet.  Gastrointestinal: Negative for blood in stool, constipation  and diarrhea.  Endocrine: Negative for increased urination.  Genitourinary: Negative for vaginal dryness.  Musculoskeletal: Positive for arthralgias, joint pain, joint swelling and morning stiffness. Negative for myalgias, muscle weakness, muscle tenderness and myalgias.  Skin: Positive for rash and hair loss. Negative for color change, skin tightness, ulcers and sensitivity to sunlight.  Allergic/Immunologic: Negative for susceptible to infections.  Neurological: Negative for dizziness, memory loss and night sweats.  Hematological: Negative for swollen glands.  Psychiatric/Behavioral: Positive for sleep disturbance. Negative for depressed mood. The patient is not nervous/anxious.     PMFS History:  Patient Active Problem List   Diagnosis Date Noted  . Primary osteoarthritis of both knees 11/09/2016  . Chronic midline low back pain without sciatica 11/09/2016  . Vitamin D deficiency 11/09/2016  . SLE (systemic lupus erythematosus) ([place]) 09/19/2016  . High risk medication use 09/19/2016  . Autoimmune disease ([place]) 02/03/2016  . Insomnia 01/29/2016  . Depo-Provera contraceptive status 08/11/2015  . Raynaud's disease 08/11/2015  . Seasonal allergies 08/11/2015  . IDA (iron deficiency anemia) 08/11/2015    Past Medical History:  Diagnosis Date  . Raynaud disease     Family History  Problem Relation Age of Onset  . Aneurysm Mother   . Stroke Sister    Past Surgical History:  Procedure Laterality Date  . FRACTURE SURGERY    . TONSILLECTOMY     Social History   Social History Narrative  . No narrative on file     Objective: Vital Signs: BP (!) 148/98  Pulse 82   Resp 14   Ht 5\' 3"  (1.6 m)   Wt 183 lb (83 kg)   BMI 32.42 kg/m    Physical Exam  Constitutional: She is oriented to person, place, and time. She appears well-developed and well-nourished.  HENT:  Head: Normocephalic and atraumatic.  Eyes: Conjunctivae and EOM are normal.  Neck: Normal range of motion.    Cardiovascular: Normal rate, regular rhythm, normal heart sounds and intact distal pulses.   Pulmonary/Chest: Effort normal and breath sounds normal.  Abdominal: Soft. Bowel sounds are normal.  Lymphadenopathy:    She has no cervical adenopathy.  Neurological: She is alert and oriented to person, place, and time.  Skin: Skin is warm and dry. Capillary refill takes less than 2 seconds. Rash noted.  Erythematous maculopapular rash on bilateral upper extremities around her elbows and her hands and also lower back.  Psychiatric: She has a normal mood and affect. Her behavior is normal.  Nursing note and vitals reviewed.    Musculoskeletal Exam: C-spine good range of motion she is painful limited range of motion of her shoulder she has discomfort with range of motion of her elbow joints. She has synovitis and limited range of motion of her wrist joints she has incomplete fist formation bilaterally with synovitis over MCPs and PIP joints. Hip joints and knee joints are good range of motion. She has discomfort range of motion of her ankle joints and tenderness across her MTP joints.  CDAI Exam: No CDAI exam completed.    Investigation: Findings:   August 2017:  CBC showed hemoglobin 11.6.  Comprehensive metabolic panel showed potassium 3.4.  CK was 29.  Beta-2 lupus anticoagulant and anticardiolipin antibodies were negative.  RNP was positive.  Smith and SCL-70 was negative.  Hepatitis panel, immunoglobulins, SPEP and TB Gold were negative.  Her eye exam was normal per patient in August.  EKG was also normal per patient in August.  06/02/2016 She has been on long term use of prednisone for more than 1 year.  We will try to taper it gradually.  I have advised her to stay on prednisone 10 mg for 2 months, then 7.5 mg for 2 months, 5 mg for 2 months  09/19/2016 office visit with Mr Carlyon Shadow, Long-term use of prednisone for greater than 1 year. On the last visit we wanted to start her on a taper but  unfortunately when she ran out of her medication she went back to [facility] and they put her on 20 mg of prednisone. With the patient on how to taper and when to call us so we can give her proper medical advice. Patient understands and is agreeable.  Systemic lupus erythematosus with positive ANA, positive double-stranded DNA, positive Ro, RNP and rheumatoid factor.  She has history of nasal ulcers, fatigue, anemia, arthritis and Raynaud phenomenon.    04/27/2016 X-rays of bilateral hands, 2 views today, were within normal limits without any joint space narrowing or erosive changes.   X-rays of bilateral feet, 2 views today, were also within normal limits without any joint space narrowing or erosive changes.   Bilateral knee joint x-rays, 2 views today, showed mild medial compartment narrowing which shows mild osteoarthritis.   Lumbar spine x-rays showed facet joint arthropathy.     Imaging: US Abdomen Limited Ruq  Result Date: 11/02/2016 CLINICAL DATA:  Abnormal physical finding. EXAM: US ABDOMEN LIMITED - RIGHT UPPER QUADRANT COMPARISON:  None. FINDINGS: Gallbladder: No gallstones or wall thickening visualized. No  sonographic Percell Miller sign noted by sonographer. Common bile duct: Diameter: 2.0 mm Liver: No focal lesion identified. Within normal limits in parenchymal echogenicity. IMPRESSION: Negative right upper quadrant ultrasound. Electronically Signed   By: Franchot Gallo M.D.   On: 11/02/2016 13:20    Speciality Comments: No specialty comments available.    Procedures:  No procedures performed Allergies: Patient has no known allergies.   Assessment / Plan:     Visit Diagnoses: Other systemic lupus erythematosus with other organ involvement (HCC) - Positive ANA, positive double-stranded DNA, positive Ro, positive RNP, positive RF, history of nasal ulcers fatigue anemia, arthritis, Raynauds. Patient discontinued Plaquenil due to rash. She's having a flare with pain and swelling  in multiple joints. She is active synovitis on examination today. She also has rash over her extremities and trunk. I would increase her prednisone to 20 mg by mouth daily as a bridging therapy. As she had Depo-Provera injection now we can start her on methotrexate indications side effects contraindications were discussed at length. Plan is to start on subcutaneous methotrexate is to be more effective in work faster. Informed consent was obtained today. The plan is to start 1.4 mL subcutaneous for 2 weeks and increase it to 0.6 mL subcutaneous for 2 weeks and then 0.8 ML subcutaneous every week at the labs are stable she will also need folic acid 2 mg by mouth daily. We will check her labs every 2 weeks 3 and then every 2 months to monitor for drug toxicity. After 2 weeks I will taper her prednisone to 15 mg for 2 weeks then 10 mg for 2 weeks then 7.5 mg for 2 weeks 5 mg for 2 weeks and 2.5 mg for 2 weeks.  High risk medication use - PLQ discontinued due to rash  Raynaud's disease without gangrene: She is active disease   Rash: She has rash on her upper extremities and trunk today.  Primary osteoarthritis of both knees  Chronic midline low back pain without sciatica - Facet joint arthropathy  Depression: Patient is quite is overwhelmed with her current diagnosis and treatment. She was tearful throughout the conversation and more requested antidepressant. She denied any suicidal thoughts. Indications side effects contraindications of Lexapro were discussed. I'll call in a prescription for Lexapro 10 mg by mouth daily at bedtime. I've advised her to follow up with her PCP for future treatment of depression.  Essential hypertension: She is noncompliant with her blood pressure medication. Blood pressure is elevated today she's been advised to take with pressure medication a regular basis   Primary insomnia  Vitamin D deficiency    Orders: No orders of the defined types were placed in this  encounter.  No orders of the defined types were placed in this encounter.   Face-to-face time spent with patient was 40 minutes. 50% of time was spent in counseling and coordination of care.  Follow-Up Instructions: Return for Systemic lupus.   Bo Merino, MD  Note - This record has been created using Editor, commissioning.  Chart creation errors have been sought, but may not always  have been located. Such creation errors do not reflect on  the standard of medical care.

## 2016-11-09 NOTE — Progress Notes (Deleted)
Office Visit Note  Patient: [name] [name]             Date of Birth: [birth year]           MRN: [id number]             PCP: No PCP Per Patient Referring: No ref. provider found Visit Date: 11/18/2016 Occupation: '@GUAROCC' @    Subjective:  No chief complaint on file.   History of Present Illness: [name] [name] is a 21 y.o. female ***   Activities of Daily Living:  Patient reports morning stiffness for *** {minute/hour:19697}.   Patient {ACTIONS;DENIES/REPORTS:21021675::"Denies"} nocturnal pain.  Difficulty dressing/grooming: {ACTIONS;DENIES/REPORTS:21021675::"Denies"} Difficulty climbing stairs: {ACTIONS;DENIES/REPORTS:21021675::"Denies"} Difficulty getting out of chair: {ACTIONS;DENIES/REPORTS:21021675::"Denies"} Difficulty using hands for taps, buttons, cutlery, and/or writing: {ACTIONS;DENIES/REPORTS:21021675::"Denies"}   No Rheumatology ROS completed.   PMFS History:  Patient Active Problem List   Diagnosis Date Noted  . SLE (systemic lupus erythematosus) ([place]) 09/19/2016  . High risk medication use 09/19/2016  . Autoimmune disease ([place]) 02/03/2016  . Insomnia 01/29/2016  . Depo-Provera contraceptive status 08/11/2015  . Raynaud's disease 08/11/2015  . Seasonal allergies 08/11/2015  . IDA (iron deficiency anemia) 08/11/2015    Past Medical History:  Diagnosis Date  . Raynaud disease     Family History  Problem Relation Age of Onset  . Aneurysm Mother   . Stroke Sister    Past Surgical History:  Procedure Laterality Date  . FRACTURE SURGERY    . TONSILLECTOMY     Social History   Social History Narrative  . No narrative on file     Objective: Vital Signs: There were no vitals taken for this visit.   Physical Exam   Musculoskeletal Exam: ***  CDAI Exam: No CDAI exam completed.    Investigation: No additional findings.  Office Visit on 10/26/2016  Component Date Value Ref Range Status  . WBC 10/26/2016 5.7  3.8 - 10.8 K/uL Final  . RBC  10/26/2016 5.40* 3.80 - 5.10 MIL/uL Final  . Hemoglobin 10/26/2016 12.3  11.7 - 15.5 g/dL Final  . HCT 10/26/2016 39.0  35.0 - 45.0 % Final  . MCV 10/26/2016 72.2* 80.0 - 100.0 fL Final  . MCH 10/26/2016 22.8* 27.0 - 33.0 pg Final  . MCHC 10/26/2016 31.5* 32.0 - 36.0 g/dL Final  . RDW 10/26/2016 18.8* 11.0 - 15.0 % Final  . Platelets 10/26/2016 422* 140 - 400 K/uL Final  . Neutro Abs 10/26/2016 4161  1,500 - 7,800 cells/uL Final  . Lymphs Abs 10/26/2016 1026  850 - 3,900 cells/uL Final  . Monocytes Absolute 10/26/2016 456  200 - 950 cells/uL Final  . Eosinophils Absolute 10/26/2016 57  15 - 500 cells/uL Final  . Basophils Absolute 10/26/2016 0  0 - 200 cells/uL Final  . Neutrophils Relative % 10/26/2016 73  % Final  . Lymphocytes Relative 10/26/2016 18  % Final  . Monocytes Relative 10/26/2016 8  % Final  . Eosinophils Relative 10/26/2016 1  % Final  . Basophils Relative 10/26/2016 0  % Final  . Smear Review 10/26/2016 Criteria for review not met   Final  . Sodium 10/26/2016 141  135 - 146 mmol/L Final  . Potassium 10/26/2016 3.7  3.5 - 5.3 mmol/L Final  . Chloride 10/26/2016 107  98 - 110 mmol/L Final  . CO2 10/26/2016 24  20 - 31 mmol/L Final  . Glucose, Bld 10/26/2016 82  65 - 99 mg/dL Final  . BUN 10/26/2016 7  7 - 25 mg/dL  Final  . Creat 10/26/2016 0.64  0.50 - 1.10 mg/dL Final  . Total Bilirubin 10/26/2016 0.3  0.2 - 1.2 mg/dL Final  . Alkaline Phosphatase 10/26/2016 58  33 - 115 U/L Final  . AST 10/26/2016 15  10 - 30 U/L Final  . ALT 10/26/2016 11  6 - 29 U/L Final  . Total Protein 10/26/2016 7.1  6.1 - 8.1 g/dL Final  . Albumin 10/26/2016 3.6  3.6 - 5.1 g/dL Final  . Calcium 10/26/2016 9.0  8.6 - 10.2 mg/dL Final    Imaging: US Abdomen Limited Ruq  Result Date: 11/02/2016 CLINICAL DATA:  Abnormal physical finding. EXAM: US ABDOMEN LIMITED - RIGHT UPPER QUADRANT COMPARISON:  None. FINDINGS: Gallbladder: No gallstones or wall thickening visualized. No sonographic Murphy  sign noted by sonographer. Common bile duct: Diameter: 2.0 mm Liver: No focal lesion identified. Within normal limits in parenchymal echogenicity. IMPRESSION: Negative right upper quadrant ultrasound. Electronically Signed   By: Franchot Gallo M.D.   On: 11/02/2016 13:20    Speciality Comments: No specialty comments available.    Procedures:  No procedures performed Allergies: Patient has no known allergies.   Assessment / Plan:     Visit Diagnoses: Other systemic lupus erythematosus with other organ involvement ([place])  Autoimmune disease ([place])  High risk medication use  Primary insomnia  Raynaud's disease without gangrene  Depo-Provera contraceptive status    Orders: No orders of the defined types were placed in this encounter.  No orders of the defined types were placed in this encounter.   Face-to-face time spent with patient was *** minutes. 50% of time was spent in counseling and coordination of care.  Follow-Up Instructions: No Follow-up on file.   Margrett Kalb, RT  Note - This record has been created using [employer].  Chart creation errors have been sought, but may not always  have been located. Such creation errors do not reflect on  the standard of medical care.

## 2016-11-09 NOTE — Patient Instructions (Signed)
Standing Labs We placed an order today for your standing lab work.    Please come back and get your standing labs every 2 weeks times 3, then every 2 months.    We have open lab Monday through Friday from 8:30-11:30 AM and 1:30-4 PM at the office of Dr. Tresa Moore, PA.   The office is located at [address], [place], [place], [place] [address] No appointment is necessary.   Labs are drawn by [facility].  You may receive a bill from [facility] for your lab work.     Methotrexate subcutaneous injection What is this medicine? METHOTREXATE (METH oh TREX ate) is a cytotoxic drug that also suppresses the immune system. It is used to treat psoriasis and rheumatoid arthritis. This medicine may be used for other purposes; ask your health care provider or pharmacist if you have questions. COMMON BRAND NAME(S): Otrexup, Rasuvo What should I tell my health care provider before I take this medicine? They need to know if you have any of these conditions: -fluid in the stomach area or lungs -if you often drink alcohol -infection or immune system problems -kidney disease -liver disease -low blood counts, like low white cell, platelet, or red cell counts -lung disease -radiation therapy -stomach ulcers -ulcerative colitis -an unusual or allergic reaction to methotrexate, other medicines, foods, dyes, or preservatives -pregnant or trying to get pregnant -breast-feeding How should I use this medicine? This medicine is for injection under the skin. You will be taught how to prepare and give this medicine. Refer to the Instructions for Use that come with your medication packaging. Use exactly as directed. Take your medicine at regular intervals. Do not take your medicine more often than directed. This medicine should be taken weekly, NOT daily. It is important that you put your used needles and syringes in a special sharps container. Do not put them in a trash can. If you do not have  a sharps container, call your pharmacist of healthcare provider to get one. Talk to your pediatrician regarding the use of this medicine in children. While this drug may be prescribed for children as young as 2 years for selected conditions, precautions do apply. Overdosage: If you think you have taken too much of this medicine contact a poison control center or emergency room at once. NOTE: This medicine is only for you. Do not share this medicine with others. What if I miss a dose? If you are not sure if this medicine was injected or if you have a hard time giving the injection, do not inject another dose. Talk with your doctor or health care professional. What may interact with this medicine? This medicine may interact with the following medications: -acitretin -aspirin or aspirin-like medicines including salicylates -azathioprine -certain antibiotics like chloramphenicol, penicillin, tetracycline -cyclosporine -gold -hydroxychloroquine -live virus vaccines -mercaptopurine -NSAIDs, medicines for pain and inflammation, like ibuprofen or naproxen -other cytotoxic agents -penicillamine -phenylbutazone -phenytoin -probenacid -retinoids such as isotretinoin and tretinoin -steroid medicines like prednisone or cortisone -sulfonamides like sulfasalazine and trimethoprim/sulfamethoxazole -theophylline This list may not describe all possible interactions. Give your health care provider a list of all the medicines, herbs, non-prescription drugs, or dietary supplements you use. Also tell them if you smoke, drink alcohol, or use illegal drugs. Some items may interact with your medicine. What should I watch for while using this medicine? Avoid alcoholic drinks. This medicine can make you more sensitive to the sun. Keep out of the sun. If you cannot avoid being in  the sun, wear protective clothing and use sunscreen. Do not use sun lamps or tanning beds/booths. You may get drowsy or dizzy. Do not  drive, use machinery, or do anything that needs mental alertness until you know how this medicine affects you. Do not stand or sit up quickly, especially if you are an older patient. This reduces the risk of dizzy or fainting spells. You may need blood work done while you are taking this medicine. Call your doctor or health care professional for advice if you get a fever, chills or sore throat, or other symptoms of a cold or flu. Do not treat yourself. This drug decreases your body's ability to fight infections. Try to avoid being around people who are sick. This medicine may increase your risk to bruise or bleed. Call your doctor or health care professional if you notice any unusual bleeding. Check with your doctor or health care professional if you get an attack of severe diarrhea, nausea and vomiting, or if you sweat a lot. The loss of too much body fluid can make it dangerous for you to take this medicine. Talk to your doctor about your risk of cancer. You may be more at risk for certain types of cancers if you take this medicine. Both men and women must use effective birth control with this medicine. Do not become pregnant while taking this medicine or until at least 1 normal menstrual cycle has occurred after stopping it. Women should inform their doctor if they wish to become pregnant or think they might be pregnant. Men should not father a child while taking this medicine and for 3 months after stopping it. There is a potential for serious side effects to an unborn child. Talk to your health care professional or pharmacist for more information. Do not breast-feed an infant while taking this medicine. What side effects may I notice from receiving this medicine? Side effects that you should report to your doctor or health care professional as soon as possible: -allergic reactions like skin rash, itching or hives, swelling of the face, lips, or tongue -breathing problems or shortness of  breath -diarrhea -dry, nonproductive cough -low blood counts - this medicine may decrease the number of white blood cells, red blood cells and platelets. You may be at increased risk of infections and bleeding -mouth sores -redness, blistering, peeling or loosening of the skin, including inside the mouth -signs of infection - fever or chills, cough, sore throat, pain or difficulty passing urine -signs and symptoms of bleeding such as bloody or black, tarry stools; red or dark-brown urine; spitting up blood or brown material that looks like coffee grounds; red spots on the skin; unusual bruising or bleeding from the eye, gums, or nose -signs and symptoms of kidney injury like trouble passing urine or change in the amount of urine -signs and symptoms of liver injury like dark yellow or brown urine; general ill feeling or flu-like symptoms; light-colored stools; loss of appetite; nausea; right upper belly pain; unusually weak or tired; yellowing of the eyes or skin Side effects that usually do not require medical attention (report to your doctor or health care professional if they continue or are bothersome): -dizziness -hair loss -headache -stomach pain -upset stomach -vomiting This list may not describe all possible side effects. Call your doctor for medical advice about side effects. You may report side effects to FDA at 1-800-FDA-1088. Where should I keep my medicine? Keep out of the reach of children. You will be instructed on  how to store this medicine. Throw away any unused medicine after the expiration date on the label. NOTE: This sheet is a summary. It may not cover all possible information. If you have questions about this medicine, talk to your doctor, pharmacist, or health care provider.  2018 Elsevier/Gold Standard (2015-10-01 11:50:46)

## 2016-11-09 NOTE — Progress Notes (Signed)
Pharmacy Note  Subjective: Patient presents today to the [facility] to see Dr. Estanislado Pandy.  Patient seen by the pharmacist for counseling on subcutaneous methotrexate.    Objective: CBC    Component Value Date/Time   WBC 5.7 10/26/2016 1110   RBC 5.40 (H) 10/26/2016 1110   HGB 12.3 10/26/2016 1110   HCT 39.0 10/26/2016 1110   PLT 422 (H) 10/26/2016 1110   MCV 72.2 (L) 10/26/2016 1110   MCH 22.8 (L) 10/26/2016 1110   MCHC 31.5 (L) 10/26/2016 1110   RDW 18.8 (H) 10/26/2016 1110   LYMPHSABS 1,026 10/26/2016 1110   MONOABS 456 10/26/2016 1110   EOSABS 57 10/26/2016 1110   BASOSABS 0 10/26/2016 1110   CMP     Component Value Date/Time   NA 141 10/26/2016 1110   K 3.7 10/26/2016 1110   CL 107 10/26/2016 1110   CO2 24 10/26/2016 1110   GLUCOSE 82 10/26/2016 1110   BUN 7 10/26/2016 1110   CREATININE 0.64 10/26/2016 1110   CALCIUM 9.0 10/26/2016 1110   PROT 7.1 10/26/2016 1110   ALBUMIN 3.6 10/26/2016 1110   AST 15 10/26/2016 1110   ALT 11 10/26/2016 1110   ALKPHOS 58 10/26/2016 1110   BILITOT 0.3 10/26/2016 1110   GFRNONAA >89 08/11/2015 1102   GFRAA >89 08/11/2015 1102    TB Gold: negative (04/27/16) Hepatitis panel: negative (04/27/16) HIV: negative (01/29/16)  Chest-xray:  05/11/16 "IMPRESSION: No active cardiopulmonary disease."   Contraception: Patient currently gets depo-provera injections.  She was counseled on the importance of strict contraception.  Patient had depo-provera on 10/21/16.  She will be due for next depo-provera between 01/06/17 and 01/14/17.  Patient was counseled that methotrexate must be discontinued at least 3 months before considering conceiving.    Assessment/Plan:  Patient was initiated on methotrexate 0.4 mL weekly for two weeks, then 0.6 mL weekly for two weeks, then 0.8 mL weekly.  Patient was counseled on the purpose, proper use, and adverse effects of methotrexate including nausea, infection, and signs and symptoms of pneumonitis.   Reviewed instructions with patient to take methotrexate weekly along with folic acid daily.  Discussed the importance of frequent monitoring of kidney and liver function and blood counts, and provided patient with standing lab instructions.  Counseled patient to avoid NSAIDs and alcohol while on methotrexate.  Provided patient with educational materials on methotrexate and answered all questions.  Advised patient to get annual influenza vaccine and to get a pneumococcal vaccine if patient has not already had one.  Patient voiced understanding.  Patient consented to methotrexate use.  Will upload into chart.    Educated patient on how to use a vial and syringe and reviewed injection technique with patient.  Patient was able to demonstrate proper technique for injections using vial and syringe.  Provided patient on educational material regarding injection technique and storage of methotrexate.    [name], Pharm.D., BCPS Clinical Pharmacist Pager: [phone] Phone: [phone] 11/09/2016 4:25 PM

## 2016-11-16 NOTE — Progress Notes (Signed)
Office Visit Note  Patient: [name] [name]             Date of Birth: 06/30/1996           MRN: [id number]             PCP: No PCP Per Patient Referring: No ref. provider found Visit Date: 11/18/2016 Occupation: @GUAROCC @    Subjective:  Follow-up on lupus   History of Present Illness: [name] [name] is a 21 y.o. female with history of systemic lupus erythematosus. She was having a flare last visit and was a started on methotrexate. She has taken only 2 doses of subcutaneous methotrexate at 0.4 mL so far. She is also on prednisone 20 mg by mouth daily. She has noticed a lot of improvement in her joint pain and joint swelling. In fact she denies any joint pain today. Her rash has resolved. She also started Lexapro after her last visit and that is helped her. Her Raynauds is still active.  Activities of Daily Living:  Patient reports morning stiffness for 30 minutes.   Patient Denies nocturnal pain.  Difficulty dressing/grooming: Denies Difficulty climbing stairs: Denies Difficulty getting out of chair: Denies Difficulty using hands for taps, buttons, cutlery, and/or writing: Denies   Review of Systems  Constitutional: Negative for fatigue, night sweats, weight gain, weight loss and weakness.  HENT: Negative for mouth sores, trouble swallowing, trouble swallowing, mouth dryness and nose dryness.   Eyes: Negative for pain, redness, visual disturbance and dryness.  Respiratory: Negative for cough, shortness of breath and difficulty breathing.   Cardiovascular: Negative for chest pain, palpitations, hypertension, irregular heartbeat and swelling in legs/feet.  Gastrointestinal: Negative for blood in stool, constipation and diarrhea.  Endocrine: Negative for increased urination.  Genitourinary: Negative for vaginal dryness.  Musculoskeletal: Positive for morning stiffness. Negative for arthralgias, joint pain, joint swelling, myalgias, muscle weakness, muscle tenderness and myalgias.    Skin: Positive for color change. Negative for rash, hair loss, skin tightness, ulcers and sensitivity to sunlight.  Allergic/Immunologic: Negative for susceptible to infections.  Neurological: Negative for dizziness, memory loss and night sweats.  Hematological: Negative for swollen glands.  Psychiatric/Behavioral: Positive for depressed mood. Negative for sleep disturbance. The patient is not nervous/anxious.     PMFS History:  Patient Active Problem List   Diagnosis Date Noted  . Primary osteoarthritis of both knees 11/09/2016  . Chronic midline low back pain without sciatica 11/09/2016  . Vitamin D deficiency 11/09/2016  . SLE (systemic lupus erythematosus) ([place]) 09/19/2016  . High risk medication use 09/19/2016  . Autoimmune disease ([place]) 02/03/2016  . Insomnia 01/29/2016  . Depo-Provera contraceptive status 08/11/2015  . Raynaud's disease 08/11/2015  . Seasonal allergies 08/11/2015  . IDA (iron deficiency anemia) 08/11/2015    Past Medical History:  Diagnosis Date  . Raynaud disease     Family History  Problem Relation Age of Onset  . Aneurysm Mother   . Stroke Sister    Past Surgical History:  Procedure Laterality Date  . FRACTURE SURGERY    . TONSILLECTOMY     Social History   Social History Narrative  . No narrative on file     Objective: Vital Signs: BP 138/78   Pulse 84   Resp 14   Wt 180 lb (81.6 kg)   BMI 31.89 kg/m    Physical Exam  Constitutional: She is oriented to person, place, and time. She appears well-developed and well-nourished.  HENT:  Head: Normocephalic and atraumatic.  Eyes: Conjunctivae and EOM are normal.  Neck: Normal range of motion.  Cardiovascular: Normal rate, regular rhythm, normal heart sounds and intact distal pulses.   Pulmonary/Chest: Effort normal and breath sounds normal.  Abdominal: Soft. Bowel sounds are normal.  Lymphadenopathy:    She has no cervical adenopathy.  Neurological: She is alert and oriented to  person, place, and time.  Skin: Skin is warm and dry. Capillary refill takes less than 2 seconds.  No erythema was noted today. She has some hyperpigmentation from the previous rash. Hyperpigmentation was noted on her back abdomen and upper extremities.  Psychiatric: She has a normal mood and affect. Her behavior is normal.  Nursing note and vitals reviewed.    Musculoskeletal Exam: C-spine and thoracic lumbar spine good range of motion. Shoulder joints elbow joints wrist joint MCPs PIPs DIPs with good range of motion with no synovitis hip joints knee joints ankles MTPs PIPs with good range of motion with no synovitis.  CDAI Exam: No CDAI exam completed.    Investigation: No additional findings.   Imaging: US Abdomen Limited Ruq  Result Date: 11/02/2016 CLINICAL DATA:  Abnormal physical finding. EXAM: US ABDOMEN LIMITED - RIGHT UPPER QUADRANT COMPARISON:  None. FINDINGS: Gallbladder: No gallstones or wall thickening visualized. No sonographic Murphy sign noted by sonographer. Common bile duct: Diameter: 2.0 mm Liver: No focal lesion identified. Within normal limits in parenchymal echogenicity. IMPRESSION: Negative right upper quadrant ultrasound. Electronically Signed   By: Franchot Gallo M.D.   On: 11/02/2016 13:20    Speciality Comments: No specialty comments available.    Procedures:  No procedures performed Allergies: Patient has no known allergies.   Assessment / Plan:     Visit Diagnoses: Other systemic lupus erythematosus with other organ involvement ([facility]):Positive ANA, double-stranded DNA, Ro, RNP, RF, history of nasal ulcers, fatigue, anemia, arthritis, Raynauds. She was started on prednisone taper and methotrexate. She is clinically doing much better with resolution of her arthritis. Her rash has resolved as well. She has some postinflammatory changes. The plan is to taper her prednisone by 2.5 mg every 2 weeks. She will continue methotrexate and will escalate the dose if  her labs are stable. She's been tolerating methotrexate well.  Raynaud's disease without gangrene: Her hands were nice and warm to touch today although she does have symptoms of Raynauds in exposed to cold weather.  High risk medication use: She is on methotrexate 0.4 mL subcutaneously and folic acid 2 mg by mouth daily, prednisone 20 mg by mouth daily.  Primary osteoarthritis of both knees: Chronic pain  Chronic midline low back pain without sciatica  Primary insomnia: Improved on Lexapro  Depression : Appears to be situational and is better on Lexapro. I would like to taper heroff eventually.   Orders: No orders of the defined types were placed in this encounter.  No orders of the defined types were placed in this encounter.   Face-to-face time spent with patient was 30 minutes. 50% of time was spent in counseling and coordination of care.  Follow-Up Instructions: Return in about 2 months (around 01/18/2017) for Systemic lupus.   Bo Merino, MD  Note - This record has been created using [employer].  Chart creation errors have been sought, but may not always  have been located. Such creation errors do not reflect on  the standard of medical care.

## 2016-11-18 ENCOUNTER — Ambulatory Visit (INDEPENDENT_AMBULATORY_CARE_PROVIDER_SITE_OTHER): Payer: BLUE CROSS/BLUE SHIELD | Admitting: Rheumatology

## 2016-11-18 ENCOUNTER — Ambulatory Visit: Payer: BLUE CROSS/BLUE SHIELD | Admitting: Rheumatology

## 2016-11-18 ENCOUNTER — Encounter: Payer: Self-pay | Admitting: Rheumatology

## 2016-11-18 VITALS — BP 138/78 | HR 84 | Resp 14 | Wt 180.0 lb

## 2016-11-18 DIAGNOSIS — M170 Bilateral primary osteoarthritis of knee: Secondary | ICD-10-CM | POA: Diagnosis not present

## 2016-11-18 DIAGNOSIS — G8929 Other chronic pain: Secondary | ICD-10-CM | POA: Diagnosis not present

## 2016-11-18 DIAGNOSIS — Z79899 Other long term (current) drug therapy: Secondary | ICD-10-CM

## 2016-11-18 DIAGNOSIS — F3289 Other specified depressive episodes: Secondary | ICD-10-CM | POA: Diagnosis not present

## 2016-11-18 DIAGNOSIS — I7300 Raynaud's syndrome without gangrene: Secondary | ICD-10-CM | POA: Diagnosis not present

## 2016-11-18 DIAGNOSIS — M3219 Other organ or system involvement in systemic lupus erythematosus: Secondary | ICD-10-CM | POA: Diagnosis not present

## 2016-11-18 DIAGNOSIS — M545 Low back pain, unspecified: Secondary | ICD-10-CM

## 2016-11-18 DIAGNOSIS — F5101 Primary insomnia: Secondary | ICD-10-CM | POA: Diagnosis not present

## 2016-11-18 NOTE — Progress Notes (Signed)
Rheumatology Medication Review by a Pharmacist Does the patient feel that his/her medications are working for him/her?  Yes Has the patient been experiencing any side effects to the medications prescribed?  No Does the patient have any problems obtaining medications?  No  Issues to address at subsequent visits: None   Pharmacist comments:  [name] [name] is a pleasant 21 yo F who presents for follow up of systemic lupus.  She was most recently seen on 11/09/16 and initiated on methotrexate subcutaneous injections.  Patient reports she started methotrexate the evening of her visit.  She has taken two weekly doses of methotrexate 0.4 mL (11/09/16 and 11/16/16).  She confirms she is also taking folic acid 2 mg daily.  Reviewed with patient that she will be due for standing labs next week before increasing her methotrexate dose.  Reviewed dosing schedule to increase methotrexate to 0.6 mL weekly for two weeks if labs are normal next week, then to increase methotrexate to 0.8 mL weekly if labs after two weeks of 0.6 mL dose are normal.  Reminded patient she will need repeat labs two weeks after increasing dose to 0.6 mL then again two weeks after increasing dose to 0.8 mL weekly.  Patient voiced understanding.  She denies any questions or concerns at this time.     [name], Pharm.D., BCPS, CPP Clinical Pharmacist Pager: [phone] Phone: [phone] 11/18/2016 10:45 AM

## 2016-11-18 NOTE — Patient Instructions (Addendum)
Prednisone taper using 5 mg tablets Take prednisone 20 mg (4 tablets) daily for two weeks (11/09/16 to 11/22/16) Then decrease prednisone to 15 mg (3 tablets) daily for two weeks (11/23/16 to 12/06/16)  Then decrease prednisone to 12.5 mg (2 and 1/2 tablets) daily for two weeks (12/07/16 to 12/20/16) Then decrease prednisone to 10 mg (2 tablets) daily for two weeks (12/21/16 to 01/03/17) Then decrease prednisone to 7.5 mg (1 and 1/2 tablets) daily for two weeks (01/04/17 to 01/17/17) Then decrease prednisone to 5 mg (1 tablet) daily for two weeks (01/18/17 to 01/31/17) Then decrease prednisone to 2.5 mg (1/2 tablet) daily for two weeks (02/01/17 to 02/14/17) Then discontinue  Labs on 11/21/16 or 11/22/16 If labs are stable, increase methotrexate dose to 0.6 mL weekly for two weeks Labs again on 12/05/16 or 12/06/16 If labs are stable, increase methotrexate dose to 0.8 mL weekly Labs again on 12/19/16 or 12/20/16  We have open lab Monday through Friday from 8:30-11:30 AM and 1:30-4 PM at the office of Dr. Tresa Moore, PA.   The office is located at [address], [place], [place], [place] [address] No appointment is necessary.   Labs are drawn by [facility].  You may receive a bill from [facility] for your lab work.

## 2016-12-07 ENCOUNTER — Other Ambulatory Visit: Payer: Self-pay | Admitting: Rheumatology

## 2016-12-08 NOTE — Telephone Encounter (Signed)
[name] [name], here is to schedule further prednisone taper.  Prednisone taper using 5 mg tablets Take prednisone 20 mg (4 tablets) daily for two weeks (11/09/16 to 11/22/16) Then decrease prednisone to 15 mg (3 tablets) daily for two weeks (11/23/16 to 12/06/16)  Then decrease prednisone to 12.5 mg (2 and 1/2 tablets) daily for two weeks (12/07/16 to 12/20/16) Then decrease prednisone to 10 mg (2 tablets) daily for two weeks (12/21/16 to 01/03/17) Then decrease prednisone to 7.5 mg (1 and 1/2 tablets) daily for two weeks (01/04/17 to 01/17/17) Then decrease prednisone to 5 mg (1 tablet) daily for two weeks (01/18/17 to 01/31/17) Then decrease prednisone to 2.5 mg (1/2 tablet) daily for two weeks (02/01/17 to 02/14/17) Then discontinue  PLEASE SEND ORDER ACCORDINGLY.

## 2016-12-08 NOTE — Telephone Encounter (Signed)
Attempted to contact the patient and left message for patient to call the office.  

## 2016-12-08 NOTE — Telephone Encounter (Signed)
Please verbally explain this to patient today and again in 1 month ( to be sure she is on track with the planned schedule

## 2016-12-23 ENCOUNTER — Other Ambulatory Visit: Payer: Self-pay | Admitting: Radiology

## 2016-12-23 NOTE — Telephone Encounter (Signed)
Attempted to contact the patient and left message for patient to call the office.  

## 2016-12-23 NOTE — Telephone Encounter (Signed)
Patient left message for call back, she states she has misplaced her Methotrexate, thinks she may have put in the trash

## 2016-12-26 NOTE — Telephone Encounter (Signed)
Attempted to contact patient and left message for patient to call the office.  

## 2016-12-28 ENCOUNTER — Other Ambulatory Visit: Payer: Self-pay | Admitting: Rheumatology

## 2016-12-28 ENCOUNTER — Other Ambulatory Visit: Payer: Self-pay | Admitting: *Deleted

## 2016-12-28 ENCOUNTER — Telehealth: Payer: Self-pay | Admitting: Rheumatology

## 2016-12-28 DIAGNOSIS — Z79899 Other long term (current) drug therapy: Secondary | ICD-10-CM | POA: Diagnosis not present

## 2016-12-28 DIAGNOSIS — R21 Rash and other nonspecific skin eruption: Secondary | ICD-10-CM

## 2016-12-28 LAB — COMPLETE METABOLIC PANEL WITH GFR
ALBUMIN: 3.7 g/dL (ref 3.6–5.1)
ALT: 22 U/L (ref 6–29)
AST: 19 U/L (ref 10–30)
Alkaline Phosphatase: 81 U/L (ref 33–115)
BILIRUBIN TOTAL: 0.4 mg/dL (ref 0.2–1.2)
BUN: 9 mg/dL (ref 7–25)
CALCIUM: 9 mg/dL (ref 8.6–10.2)
CO2: 25 mmol/L (ref 20–31)
CREATININE: 0.68 mg/dL (ref 0.50–1.10)
Chloride: 106 mmol/L (ref 98–110)
GFR, Est Non African American: >89 mL/min (ref >=60)
Glucose, Bld: 81 mg/dL (ref 65–99)
Potassium: 3.6 mmol/L (ref 3.5–5.3)
SODIUM: 140 mmol/L (ref 135–146)
Total Protein: 7.2 g/dL (ref 6.1–8.1)

## 2016-12-28 MED ORDER — METHOTREXATE SODIUM CHEMO INJECTION 50 MG/2ML: INTRAMUSCULAR | 0 refills | Status: DC

## 2016-12-28 NOTE — Telephone Encounter (Signed)
I spoke to patient in office today, she has a new rash on her back and breast area. Dr Estanislado Pandy told her to continue triamcinilone on this and to see a dermatologist. I have put in the referral

## 2016-12-28 NOTE — Addendum Note (Signed)
Addended by: Carole Binning on: 12/28/2016 09:10 AM   Modules accepted: Orders

## 2016-12-28 NOTE — Telephone Encounter (Signed)
Patient states she recently moved and is unable to find her MTX.   Last Visit: 11/18/16 Next Visit: 01/18/17 Labs: 10/26/16 WNL  Okay to refill MTX?

## 2016-12-28 NOTE — Telephone Encounter (Signed)
ok 

## 2016-12-29 LAB — CBC WITH DIFFERENTIAL/PLATELET
BASOS ABS: 0 {cells}/uL (ref 0–200)
Basophils Relative: 0 %
EOS PCT: 1 %
Eosinophils Absolute: 64 cells/uL (ref 15–500)
HCT: 40.3 % (ref 35.0–45.0)
HEMOGLOBIN: 12.8 g/dL (ref 11.7–15.5)
LYMPHS ABS: 1472 {cells}/uL (ref 850–3900)
LYMPHS PCT: 23 %
MCH: 23.9 pg — AB (ref 27.0–33.0)
MCHC: 31.8 g/dL — AB (ref 32.0–36.0)
MCV: 75.2 fL — ABNORMAL LOW (ref 80.0–100.0)
Monocytes Absolute: 768 cells/uL (ref 200–950)
Monocytes Relative: 12 %
NEUTROS PCT: 64 %
Neutro Abs: 4096 cells/uL (ref 1500–7800)
Platelets: 382 10*3/uL (ref 140–400)
RBC: 5.36 MIL/uL — ABNORMAL HIGH (ref 3.80–5.10)
RDW: 20.5 % — AB (ref 11.0–15.0)
WBC: 6.4 10*3/uL (ref 3.8–10.8)

## 2016-12-29 NOTE — Progress Notes (Signed)
Labs stable

## 2017-01-02 ENCOUNTER — Other Ambulatory Visit: Payer: Self-pay | Admitting: Rheumatology

## 2017-01-02 DIAGNOSIS — L03011 Cellulitis of right finger: Secondary | ICD-10-CM | POA: Diagnosis not present

## 2017-01-02 DIAGNOSIS — L932 Other local lupus erythematosus: Secondary | ICD-10-CM | POA: Diagnosis not present

## 2017-01-02 DIAGNOSIS — L309 Dermatitis, unspecified: Secondary | ICD-10-CM | POA: Diagnosis not present

## 2017-01-02 NOTE — Telephone Encounter (Signed)
Yes, if she has established with a PCP . We can fill in the mean time.

## 2017-01-02 NOTE — Telephone Encounter (Signed)
You started her on lexapro for depression, but I think plan was for her PCP to take this over, is this correct?

## 2017-01-02 NOTE — Telephone Encounter (Signed)
Message sent to patient asking about PCP

## 2017-01-05 ENCOUNTER — Ambulatory Visit: Payer: BLUE CROSS/BLUE SHIELD | Attending: Internal Medicine | Admitting: *Deleted

## 2017-01-05 DIAGNOSIS — Z3042 Encounter for surveillance of injectable contraceptive: Secondary | ICD-10-CM

## 2017-01-05 DIAGNOSIS — Z308 Encounter for other contraceptive management: Secondary | ICD-10-CM | POA: Diagnosis not present

## 2017-01-05 MED ORDER — MEDROXYPROGESTERONE ACETATE 150 MG/ML IM SUSP
150.0000 mg | Freq: Once | INTRAMUSCULAR | Status: AC
  Administered 2017-01-05: 150 mg via INTRAMUSCULAR

## 2017-01-05 NOTE — Progress Notes (Signed)
Last Depo-Provera: 10/21/2016. Side Effects if any: n/a Serum HCG indicated? no Depo-Provera 150 mg IM given by T.Davaughn Hillyard,RN in left upper outer quadrant. Pt tolerated injection well.  Next injection July 12 through April 06, 2017. Encouraged to have OV to set up Pap Smear and establish care with provider before next injection. Reminder card given for next depo administration.

## 2017-01-06 ENCOUNTER — Ambulatory Visit: Payer: BLUE CROSS/BLUE SHIELD

## 2017-01-13 NOTE — Progress Notes (Deleted)
Office Visit Note  Patient: [name] [name]             Date of Birth: 10-25-1995           MRN: [id number]             PCP: No PCP Per Patient Referring: No ref. provider found Visit Date: 01/18/2017 Occupation: @GUAROCC @    Subjective:  No chief complaint on file.   History of Present Illness: [name] [name] is a 21 y.o. female ***   Activities of Daily Living:  Patient reports morning stiffness for *** {minute/hour:19697}.   Patient {ACTIONS;DENIES/REPORTS:21021675::"Denies"} nocturnal pain.  Difficulty dressing/grooming: {ACTIONS;DENIES/REPORTS:21021675::"Denies"} Difficulty climbing stairs: {ACTIONS;DENIES/REPORTS:21021675::"Denies"} Difficulty getting out of chair: {ACTIONS;DENIES/REPORTS:21021675::"Denies"} Difficulty using hands for taps, buttons, cutlery, and/or writing: {ACTIONS;DENIES/REPORTS:21021675::"Denies"}   No Rheumatology ROS completed.   PMFS History:  Patient Active Problem List   Diagnosis Date Noted  . Primary osteoarthritis of both knees 11/09/2016  . Chronic midline low back pain without sciatica 11/09/2016  . Vitamin D deficiency 11/09/2016  . SLE (systemic lupus erythematosus) ([place]) 09/19/2016  . High risk medication use 09/19/2016  . Insomnia 01/29/2016  . Depo-Provera contraceptive status 08/11/2015  . Raynaud's disease 08/11/2015  . Seasonal allergies 08/11/2015  . IDA (iron deficiency anemia) 08/11/2015    Past Medical History:  Diagnosis Date  . Raynaud disease     Family History  Problem Relation Age of Onset  . Aneurysm Mother   . Stroke Sister    Past Surgical History:  Procedure Laterality Date  . FRACTURE SURGERY    . TONSILLECTOMY     Social History   Social History Narrative  . No narrative on file     Objective: Vital Signs: There were no vitals taken for this visit.   Physical Exam   Musculoskeletal Exam: ***  CDAI Exam: No CDAI exam completed.    Investigation: No additional findings.  CBC Latest Ref  Rng & Units 12/28/2016 10/26/2016 12/15/2015  WBC 3.8 - 10.8 K/uL 6.4 5.7 -  Hemoglobin 11.7 - 15.5 g/dL 12.8 12.3 12.6  Hematocrit 35.0 - 45.0 % 40.3 39.0 37.0  Platelets 140 - 400 K/uL 382 422(H) -    CMP Latest Ref Rng & Units 12/28/2016 10/26/2016 12/15/2015  Glucose 65 - 99 mg/dL 81 82 96  BUN 7 - 25 mg/dL 9 7 7   Creatinine 0.50 - 1.10 mg/dL 0.68 0.64 0.50  Sodium 135 - 146 mmol/L 140 141 139  Potassium 3.5 - 5.3 mmol/L 3.6 3.7 3.9  Chloride 98 - 110 mmol/L 106 107 103  CO2 20 - 31 mmol/L 25 24 -  Calcium 8.6 - 10.2 mg/dL 9.0 9.0 -  Total Protein 6.1 - 8.1 g/dL 7.2 7.1 -  Total Bilirubin 0.2 - 1.2 mg/dL 0.4 0.3 -  Alkaline Phos 33 - 115 U/L 81 58 -  AST 10 - 30 U/L 19 15 -  ALT 6 - 29 U/L 22 11 -   Imaging: No results found.  Speciality Comments: No specialty comments available.    Procedures:  No procedures performed Allergies: Patient has no known allergies.   Assessment / Plan:     Visit Diagnoses: Other systemic lupus erythematosus with other organ involvement ([facility])  High risk medication use  Vitamin D deficiency  Primary osteoarthritis of both knees  Chronic midline low back pain without sciatica  Raynaud's disease without gangrene  Primary insomnia  Depo-Provera contraceptive status  History of anemia  Rash and other nonspecific skin eruption  Orders: No orders of the defined types were placed in this encounter.  No orders of the defined types were placed in this encounter.   Face-to-face time spent with patient was *** minutes. 50% of time was spent in counseling and coordination of care.  Follow-Up Instructions: No Follow-up on file.   Amy Littrell, RT  Note - This record has been created using [employer].  Chart creation errors have been sought, but may not always  have been located. Such creation errors do not reflect on  the standard of medical care.

## 2017-01-18 ENCOUNTER — Ambulatory Visit: Payer: BLUE CROSS/BLUE SHIELD | Admitting: Rheumatology

## 2017-01-30 ENCOUNTER — Other Ambulatory Visit: Payer: Self-pay | Admitting: Family Medicine

## 2017-01-30 ENCOUNTER — Other Ambulatory Visit: Payer: Self-pay | Admitting: Rheumatology

## 2017-01-30 DIAGNOSIS — G4700 Insomnia, unspecified: Secondary | ICD-10-CM

## 2017-01-30 NOTE — Telephone Encounter (Signed)
Is she using Baclofen AND Flexeril ?  Does she need the taper ? Left message for her to call us back to discuss

## 2017-02-07 ENCOUNTER — Telehealth: Payer: Self-pay | Admitting: Rheumatology

## 2017-02-07 ENCOUNTER — Emergency Department (HOSPITAL_COMMUNITY)
Admission: EM | Admit: 2017-02-07 | Discharge: 2017-02-07 | Disposition: A | Discharge status: Home / Self Care | Payer: BLUE CROSS/BLUE SHIELD | Attending: Emergency Medicine | Admitting: Emergency Medicine

## 2017-02-07 ENCOUNTER — Encounter (HOSPITAL_COMMUNITY): Payer: Self-pay | Admitting: Emergency Medicine

## 2017-02-07 DIAGNOSIS — M3210 Systemic lupus erythematosus, organ or system involvement unspecified: Secondary | ICD-10-CM | POA: Insufficient documentation

## 2017-02-07 DIAGNOSIS — L723 Sebaceous cyst: Secondary | ICD-10-CM | POA: Diagnosis not present

## 2017-02-07 DIAGNOSIS — Z79899 Other long term (current) drug therapy: Secondary | ICD-10-CM | POA: Diagnosis not present

## 2017-02-07 DIAGNOSIS — L930 Discoid lupus erythematosus: Secondary | ICD-10-CM

## 2017-02-07 DIAGNOSIS — M7989 Other specified soft tissue disorders: Secondary | ICD-10-CM | POA: Diagnosis not present

## 2017-02-07 DIAGNOSIS — L729 Follicular cyst of the skin and subcutaneous tissue, unspecified: Secondary | ICD-10-CM

## 2017-02-07 DIAGNOSIS — M25562 Pain in left knee: Secondary | ICD-10-CM | POA: Insufficient documentation

## 2017-02-07 DIAGNOSIS — L089 Local infection of the skin and subcutaneous tissue, unspecified: Secondary | ICD-10-CM

## 2017-02-07 HISTORY — DX: Reserved for concepts with insufficient information to code with codable children: IMO0002

## 2017-02-07 HISTORY — DX: Systemic lupus erythematosus, unspecified: M32.9

## 2017-02-07 MED ORDER — PREDNISONE 20 MG PO TABS: ORAL_TABLET | ORAL | 0 refills | Status: DC

## 2017-02-07 MED ORDER — MUPIROCIN CALCIUM 2 % EX CREA: 1.0000 "application " | TOPICAL_CREAM | Freq: Two times a day (BID) | CUTANEOUS | 0 refills | Status: DC

## 2017-02-07 NOTE — ED Triage Notes (Signed)
Patient c/o left knee swelling x couple days. Patient states that she has lupus and when called MD that she normally gets prednisone from stated she wouldn't fill it anymore and patient needed to find a PCP.  Patient also c/o bilat ears having "oozy white stuff" x 4 weeks.

## 2017-02-07 NOTE — Discharge Instructions (Signed)
You appear to have had a cyst that ruptured about her ear. Clean it twice daily with warm soapy water and then apply antibiotic ointment.

## 2017-02-07 NOTE — Telephone Encounter (Signed)
Patient called requesting a refill of her Prednisone.  She uses [facility] on [address].  CB#[phone].  Thank  You.

## 2017-02-07 NOTE — Progress Notes (Deleted)
Office Visit Note  Patient: [name] [name]             Date of Birth: 1996-05-25           MRN: [id number]             PCP: Patient, No Pcp Per Referring: No ref. provider found Visit Date: 02/08/2017 Occupation: @GUAROCC @    Subjective:  No chief complaint on file.   History of Present Illness: [name] [name] is a 21 y.o. female ***   Activities of Daily Living:  Patient reports morning stiffness for *** {minute/hour:19697}.   Patient {ACTIONS;DENIES/REPORTS:21021675::"Denies"} nocturnal pain.  Difficulty dressing/grooming: {ACTIONS;DENIES/REPORTS:21021675::"Denies"} Difficulty climbing stairs: {ACTIONS;DENIES/REPORTS:21021675::"Denies"} Difficulty getting out of chair: {ACTIONS;DENIES/REPORTS:21021675::"Denies"} Difficulty using hands for taps, buttons, cutlery, and/or writing: {ACTIONS;DENIES/REPORTS:21021675::"Denies"}   No Rheumatology ROS completed.   PMFS History:  Patient Active Problem List   Diagnosis Date Noted  . Primary osteoarthritis of both knees 11/09/2016  . Chronic midline low back pain without sciatica 11/09/2016  . Vitamin D deficiency 11/09/2016  . SLE (systemic lupus erythematosus) ([place]) 09/19/2016  . High risk medication use 09/19/2016  . Insomnia 01/29/2016  . Depo-Provera contraceptive status 08/11/2015  . Raynaud's disease 08/11/2015  . Seasonal allergies 08/11/2015  . IDA (iron deficiency anemia) 08/11/2015    Past Medical History:  Diagnosis Date  . Lupus   . Raynaud disease     Family History  Problem Relation Age of Onset  . Aneurysm Mother   . Stroke Sister    Past Surgical History:  Procedure Laterality Date  . FRACTURE SURGERY    . TONSILLECTOMY     Social History   Social History Narrative  . No narrative on file     Objective: Vital Signs: There were no vitals taken for this visit.   Physical Exam   Musculoskeletal Exam: ***  CDAI Exam: No CDAI exam completed.    Investigation: No additional findings. CBC  Latest Ref Rng & Units 12/28/2016 10/26/2016 12/15/2015  WBC 3.8 - 10.8 K/uL 6.4 5.7 -  Hemoglobin 11.7 - 15.5 g/dL 12.8 12.3 12.6  Hematocrit 35.0 - 45.0 % 40.3 39.0 37.0  Platelets 140 - 400 K/uL 382 422(H) -   CMP Latest Ref Rng & Units 12/28/2016 10/26/2016 12/15/2015  Glucose 65 - 99 mg/dL 81 82 96  BUN 7 - 25 mg/dL 9 7 7   Creatinine 0.50 - 1.10 mg/dL 0.68 0.64 0.50  Sodium 135 - 146 mmol/L 140 141 139  Potassium 3.5 - 5.3 mmol/L 3.6 3.7 3.9  Chloride 98 - 110 mmol/L 106 107 103  CO2 20 - 31 mmol/L 25 24 -  Calcium 8.6 - 10.2 mg/dL 9.0 9.0 -  Total Protein 6.1 - 8.1 g/dL 7.2 7.1 -  Total Bilirubin 0.2 - 1.2 mg/dL 0.4 0.3 -  Alkaline Phos 33 - 115 U/L 81 58 -  AST 10 - 30 U/L 19 15 -  ALT 6 - 29 U/L 22 11 -    Imaging: No results found.  Speciality Comments: No specialty comments available.    Procedures:  No procedures performed Allergies: Patient has no known allergies.   Assessment / Plan:     Visit Diagnoses: Other systemic lupus erythematosus with other organ involvement ([place])  Raynaud's disease without gangrene  High risk medication use  Primary osteoarthritis of both knees  Chronic midline low back pain without sciatica  Vitamin D deficiency  Depo-Provera contraceptive status  Primary insomnia  History of anemia    Orders: No  orders of the defined types were placed in this encounter.  No orders of the defined types were placed in this encounter.   Face-to-face time spent with patient was *** minutes. 50% of time was spent in counseling and coordination of care.  Follow-Up Instructions: No Follow-up on file.   Bo Merino, MD  Note - This record has been created using [employer].  Chart creation errors have been sought, but may not always  have been located. Such creation errors do not reflect on  the standard of medical care.

## 2017-02-07 NOTE — Telephone Encounter (Signed)
Attempted to contact the patient and left message for patient to call the office.  

## 2017-02-07 NOTE — ED Notes (Signed)
Pt ambulatory and independent at discharge.  Verbalized understanding of discharge instructions 

## 2017-02-07 NOTE — ED Provider Notes (Signed)
[facility] DEPT Provider Note   CSN: [id number] Arrival date & time: 02/07/17  1834     History   Chief Complaint Chief Complaint  Patient presents with  . Joint Swelling    HPI [name] [name] is a 21 y.o. female.  HPI Patient reports that she has developed pain in her left knee for the past several days. No redness or swelling. Patient denies any injury. She reports she has lupus and usually when she gets joint pain like this she calls her rheumatologist and to take steroids. She reports she had a 3 month prescription so she had not talked to her rheumatologist recently about that. She reports when she called she was now told that they would not refill for her and that she needed to get established with a primary care doctor. She reports she also is from sometimes with pain and swelling in the left elbow and wrist. That spin fairly mild at this point in time. She states she has a second problem which is she gets some drainage from a small area above her right ear. She's been cleaning with alcohol but he continues to drain some purulent looking material. Past Medical History:  Diagnosis Date  . Lupus   . Raynaud disease     Patient Active Problem List   Diagnosis Date Noted  . Primary osteoarthritis of both knees 11/09/2016  . Chronic midline low back pain without sciatica 11/09/2016  . Vitamin D deficiency 11/09/2016  . SLE (systemic lupus erythematosus) ([place]) 09/19/2016  . High risk medication use 09/19/2016  . Insomnia 01/29/2016  . Depo-Provera contraceptive status 08/11/2015  . Raynaud's disease 08/11/2015  . Seasonal allergies 08/11/2015  . IDA (iron deficiency anemia) 08/11/2015    Past Surgical History:  Procedure Laterality Date  . FRACTURE SURGERY    . TONSILLECTOMY      OB History    No data available       Home Medications    Prior to Admission medications   Medication Sig Start Date End Date Taking? Authorizing Provider  baclofen (LIORESAL) 10  MG tablet Take 1 tablet (10 mg total) by mouth 3 (three) times daily. 10/19/16  Yes Panwala, Naitik, PA-C  [facility] MELATONIN EXTRA STRENGTH 5 MG TABS TAKE 1 TABLET BY MOUTH AT BEDTIME 01/31/17  Yes Funches, Josalyn, MD  ferrous sulfate 325 (65 FE) MG tablet Take 1 tablet (325 mg total) by mouth 2 (two) times daily. With 1/2 glass of orange juice 12/15/15  Yes Davonna Belling, MD  folic acid (FOLVITE) 1 MG tablet Take 2 tablets (2 mg total) by mouth daily. 11/09/16  Yes Deveshwar, Abel Presto, MD  methotrexate 50 MG/2ML injection Inject 0.4 mL subcutaneous weekly for two weeks, then increase to 0.6 mL weekly for two weeks, then increase to 0.8 mL weekly. 12/28/16  Yes Deveshwar, Abel Presto, MD  amitriptyline (ELAVIL) 25 MG tablet TAKE 1 TABLET BY MOUTH DAILY Patient not taking: Reported on 02/07/2017 04/25/16   Boykin Nearing, MD  cyclobenzaprine (FLEXERIL) 10 MG tablet Take 1 tablet (10 mg total) by mouth 3 (three) times daily as needed for muscle spasms. Patient not taking: Reported on 1/60/7371 0/62/69   Delora Fuel, MD  escitalopram (LEXAPRO) 10 MG tablet TAKE 1 TABLET (10 MG TOTAL) BY MOUTH DAILY. Patient not taking: Reported on 02/07/2017 01/02/17   Bo Merino, MD  hydrocortisone 1 % ointment Apply 1 application topically 2 (two) times daily. Patient not taking: Reported on 02/07/2017 06/11/16   Jola Schmidt, MD  mupirocin  cream (BACTROBAN) 2 % Apply 1 application topically 2 (two) times daily. 02/07/17   Charlesetta Shanks, MD  predniSONE (DELTASONE) 20 MG tablet 3 tabs po daily x 3 days, then 2 tabs x 3 days, then 1.5 tabs x 3 days, then 1 tab x 3 days, then 0.5 tabs x 3 days 02/07/17   Charlesetta Shanks, MD  predniSONE (DELTASONE) 5 MG tablet Take 12.5 mg for 2 weeks then 10 mg for 2 weeks, then 7.5 mg for 2 weeks, then 5 mg for 2 weeks then 2.5 mg for 2 weeks Patient not taking: Reported on 02/07/2017 12/08/16   Eliezer Lofts, PA-C  triamcinolone (KENALOG) 0.025 % ointment Apply 1 application topically 2 (two)  times daily. Apply to the affected area, do not apply to face. Patient not taking: Reported on 02/07/2017 06/12/16   Margarita Mail, PA-C  Tuberculin-Allergy Syringes 27G X 1/2" 1 ML KIT Inject 1 Syringe into the skin once a week. To be used with weekly methotrexate injections. 11/09/16   Bo Merino, MD  Vitamin D, Ergocalciferol, (DRISDOL) 50000 units CAPS capsule Take 1 capsule (50,000 Units total) by mouth every 7 (seven) days. For 8 weeks Patient not taking: Reported on 11/09/2016 02/03/16   Boykin Nearing, MD    Family History Family History  Problem Relation Age of Onset  . Aneurysm Mother   . Stroke Sister     Social History Social History  Substance Use Topics  . Smoking status: Never Smoker  . Smokeless tobacco: Never Used  . Alcohol use No     Allergies   Patient has no known allergies.   Review of Systems Review of Systems 10 Systems reviewed and are negative for acute change except as noted in the HPI.   Physical Exam Updated Vital Signs BP (!) 146/113 (BP Location: Right Arm)   Pulse (!) 104   Temp 98.2 F (36.8 C) (Oral)   Resp 18   Ht _0  (1.6 m)   Wt 80.7 kg (178 lb)   SpO2 100%   BMI 31.53 kg/m   Physical Exam  Constitutional: She appears well-developed and well-nourished. No distress.  HENT:  Head: Normocephalic and atraumatic.  Patient has about a 3 mm spot at the of the right ear that has some small amount of purulent looking drainage. There is no surrounding erythema. No swelling of the pinna. TM is normal.  Eyes: Conjunctivae and EOM are normal.  Neck: Neck supple.  Cardiovascular: Normal rate and regular rhythm.   No murmur heard. Pulmonary/Chest: Effort normal and breath sounds normal. No respiratory distress.  Abdominal: Soft. There is no tenderness.  Musculoskeletal: She exhibits tenderness. She exhibits no edema or deformity.  Both knees are symmetric in appearance. There is no erythema or effusion of the left knee. Lower leg  is soft and nontender. Patient does endorse some pain with range of motion of the left knee.  Neurological: She is alert.  Skin: Skin is warm and dry.  Psychiatric: She has a normal mood and affect.  Nursing note and vitals reviewed.    ED Treatments / Results  Labs (all labs ordered are listed, but only abnormal results are displayed) Labs Reviewed - No data to display  EKG  EKG Interpretation None       Radiology No results found.  Procedures Procedures (including critical care time)  Medications Ordered in ED Medications - No data to display   Initial Impression / Assessment and Plan / ED Course  I have reviewed  the triage vital signs and the nursing notes.  Pertinent labs & imaging results that were available during my care of the patient were reviewed by me and considered in my medical decision making (see chart for details).     Final Clinical Impressions(s) / ED Diagnoses   Final diagnoses:  Acute pain of left knee  Infected cyst of skin  Lupus erythematosus, unspecified form  Patient has left knee pain without erythema, effusion or constitutional symptoms. She reports this pain does flare periodically and usually responds to steroids. Patient will be provided with a steroid taper. Also has a very small area that looks like a previously infected cyst at the skin anterior to her pinna. This is less than a few millimeters. There is no associated abscess. I will have her apply mupirocin and continue twice-daily cleaning. Patient is advised to use a referral number to find a family doctor.  New Prescriptions New Prescriptions   MUPIROCIN CREAM (BACTROBAN) 2 %    Apply 1 application topically 2 (two) times daily.   PREDNISONE (DELTASONE) 20 MG TABLET    3 tabs po daily x 3 days, then 2 tabs x 3 days, then 1.5 tabs x 3 days, then 1 tab x 3 days, then 0.5 tabs x 3 days     Charlesetta Shanks, MD 02/07/17 2119

## 2017-02-08 ENCOUNTER — Ambulatory Visit: Payer: Self-pay | Admitting: Rheumatology

## 2017-02-08 DIAGNOSIS — F329 Major depressive disorder, single episode, unspecified: Secondary | ICD-10-CM | POA: Insufficient documentation

## 2017-02-08 DIAGNOSIS — B373 Candidiasis of vulva and vagina: Secondary | ICD-10-CM | POA: Diagnosis not present

## 2017-02-08 DIAGNOSIS — Z3202 Encounter for pregnancy test, result negative: Secondary | ICD-10-CM | POA: Diagnosis not present

## 2017-02-08 DIAGNOSIS — F32A Depression, unspecified: Secondary | ICD-10-CM

## 2017-02-08 DIAGNOSIS — N898 Other specified noninflammatory disorders of vagina: Secondary | ICD-10-CM | POA: Diagnosis not present

## 2017-02-08 DIAGNOSIS — L292 Pruritus vulvae: Secondary | ICD-10-CM | POA: Diagnosis not present

## 2017-02-08 HISTORY — DX: Depression, unspecified: F32.A

## 2017-02-08 NOTE — Telephone Encounter (Signed)
Spoke with patient and she is having another flare. Patient was seen in the emergency room because she is having trouble walking and because she has a ruptured cyst in her ear. Patient has been schedule to be seen on 02/09/17 @ 8:15 am. Patient is on MTX.

## 2017-02-08 NOTE — Telephone Encounter (Signed)
Attempted to contact patient and left message for patient to call the office.  

## 2017-02-08 NOTE — Progress Notes (Signed)
Office Visit Note  Patient: [name] [name]             Date of Birth: [birth year]           MRN: [id number]             PCP: Patient, No Pcp Per Referring: No ref. provider found Visit Date: 02/09/2017 Occupation: @GUAROCC @    Subjective:  Rash, joint pain.   History of Present Illness: [name] [name] is a 21 y.o. female with history of systemic lupus erythematosus. She states that she's been out of prednisone for the last 4 days. She has noticed a recurrence of rash on her right lower extremity and also she started experiencing increased joint swelling which she describes in her left knee and her left ankle. She went to the emergency room yesterday where she was given prednisone 20 mg tablets. Patient states that she's been taking one fourth tablet a day. She has noticed improvement in her symptoms. Her joint pain and joint swelling has resolved. She still has the rash.  Activities of Daily Living:  Patient reports morning stiffness for 15 minutes.   Patient Reports nocturnal pain.  Difficulty dressing/grooming: Denies Difficulty climbing stairs: Reports Difficulty getting out of chair: Reports Difficulty using hands for taps, buttons, cutlery, and/or writing: Reports   Review of Systems  Constitutional: Positive for fatigue. Negative for night sweats, weight gain, weight loss and weakness.  HENT: Negative for mouth sores, trouble swallowing, trouble swallowing, mouth dryness and nose dryness.        Nasal ulcers  Eyes: Negative for pain, redness, visual disturbance and dryness.  Respiratory: Negative for cough, shortness of breath and difficulty breathing.   Cardiovascular: Negative for chest pain, palpitations, hypertension, irregular heartbeat and swelling in legs/feet.  Gastrointestinal: Negative for blood in stool, constipation and diarrhea.  Endocrine: Negative for increased urination.  Genitourinary: Negative for vaginal dryness.  Musculoskeletal: Positive for arthralgias,  joint pain, joint swelling and morning stiffness. Negative for myalgias, muscle weakness, muscle tenderness and myalgias.  Skin: Positive for color change and rash. Negative for hair loss, skin tightness, ulcers and sensitivity to sunlight.  Allergic/Immunologic: Negative for susceptible to infections.  Neurological: Negative for dizziness, memory loss and night sweats.  Hematological: Negative for swollen glands.  Psychiatric/Behavioral: Positive for depressed mood. Negative for sleep disturbance. The patient is not nervous/anxious.     PMFS History:  Patient Active Problem List   Diagnosis Date Noted  . Depression 02/08/2017  . Primary osteoarthritis of both knees 11/09/2016  . Chronic midline low back pain without sciatica 11/09/2016  . Vitamin D deficiency 11/09/2016  . SLE (systemic lupus erythematosus) ([place]) 09/19/2016  . High risk medication use 09/19/2016  . Insomnia 01/29/2016  . Depo-Provera contraceptive status 08/11/2015  . Raynaud's disease 08/11/2015  . Seasonal allergies 08/11/2015  . IDA (iron deficiency anemia) 08/11/2015    Past Medical History:  Diagnosis Date  . Lupus   . Raynaud disease     Family History  Problem Relation Age of Onset  . Aneurysm Mother   . Stroke Sister    Past Surgical History:  Procedure Laterality Date  . FRACTURE SURGERY    . TONSILLECTOMY     Social History   Social History Narrative  . No narrative on file     Objective: Vital Signs: BP 130/89   Pulse 89   Resp 14   Ht 5\' 4"  (1.626 m)   Wt 175 lb (79.4 kg)   BMI  30.04 kg/m    Physical Exam  Constitutional: She is oriented to person, place, and time. She appears well-developed and well-nourished.  HENT:  Head: Normocephalic and atraumatic.  Eyes: Conjunctivae and EOM are normal.  Neck: Normal range of motion.  Cardiovascular: Normal rate, regular rhythm, normal heart sounds and intact distal pulses.   Pulmonary/Chest: Effort normal and breath sounds normal.    Abdominal: Soft. Bowel sounds are normal.  Lymphadenopathy:    She has no cervical adenopathy.  Neurological: She is alert and oriented to person, place, and time.  Skin: Skin is warm and dry. Capillary refill takes less than 2 seconds.  Erythematous rash macular on her right lower extremity and some hyperpigmented lesions from healed rash.  Psychiatric: She has a normal mood and affect. Her behavior is normal.  Nursing note and vitals reviewed.    Musculoskeletal Exam: C-spine and thoracic lumbar spine good range of motion. Shoulder joints elbow joints wrist joint MCPs PIPs DIPs are good range of motion with no synovitis. Hip joints knee joints ankles MTPs PIPs DIPs are good range of motion with no synovitis. She is painful range of motion of her left knee joint and left ankle joint.  CDAI Exam: No CDAI exam completed.    Investigation: No additional findings. CBC Latest Ref Rng & Units 12/28/2016 10/26/2016 12/15/2015  WBC 3.8 - 10.8 K/uL 6.4 5.7 -  Hemoglobin 11.7 - 15.5 g/dL 12.8 12.3 12.6  Hematocrit 35.0 - 45.0 % 40.3 39.0 37.0  Platelets 140 - 400 K/uL 382 422(H) -    CMP Latest Ref Rng & Units 12/28/2016 10/26/2016 12/15/2015  Glucose 65 - 99 mg/dL 81 82 96  BUN 7 - 25 mg/dL 9 7 7   Creatinine 0.50 - 1.10 mg/dL 0.68 0.64 0.50  Sodium 135 - 146 mmol/L 140 141 139  Potassium 3.5 - 5.3 mmol/L 3.6 3.7 3.9  Chloride 98 - 110 mmol/L 106 107 103  CO2 20 - 31 mmol/L 25 24 -  Calcium 8.6 - 10.2 mg/dL 9.0 9.0 -  Total Protein 6.1 - 8.1 g/dL 7.2 7.1 -  Total Bilirubin 0.2 - 1.2 mg/dL 0.4 0.3 -  Alkaline Phos 33 - 115 U/L 81 58 -  AST 10 - 30 U/L 19 15 -  ALT 6 - 29 U/L 22 11 -    Imaging: No results found.  Speciality Comments: No specialty comments available.    Procedures:  No procedures performed Allergies: Patient has no known allergies.   Assessment / Plan:     Visit Diagnoses: Other systemic lupus erythematosus with other organ involvement (HCC) -   Positive ANA,  double-stranded DNA, Ro, RNP, RF, history of nasal ulcers, fatigue, anemia, arthritis, Raynauds. She had a recent flare with increased joint pain and swelling of prednisone and started prednisone 5 mg a day with resolution of her symptoms. She's also had recurrence of rash on her right lower extremity. She continues to have some issues with fatigue, nasal ulcers, arthralgias. We had detailed discussion regarding different treatment options. I discussed the option of increasing methotrexate to 1 mL subcutaneous every week and also adding quinacrine as she is allergic response to Plaquenil. I also discussed the option of adding Benlysta. After detailed discussion we decided to increase her methotrexate to 1 mL subcutaneous every week. We will apply for Benlysta. We'll support we will start her on Benlysta. handout was given and consent was taken today.  Raynaud's disease without gangrene: She gives history of intermittent problems with Raynauds  High risk medication use - Methotrexate 0.8 ML subcutaneous every week, folic 2 mg by mouth daily acid, prednisone 5 mg by mouth daily (Plaquenil caused rash)  Primary osteoarthritis of both knees: Some discomfort  Chronic midline low back pain without sciatica: Better  Vitamin D deficiency: She is on vitamin D supplement  Depo-Provera contraceptive status  Primary insomnia  History of anemia  Other depression : Better on Lexapro   Orders: Orders Placed This Encounter  Procedures  . Urinalysis, Routine w reflex microscopic  . Sedimentation rate  . Anti-DNA antibody, double-stranded  . C3 and C4  . CBC with Differential/Platelet  . COMPLETE METABOLIC PANEL WITH GFR  . VITAMIN D 25 Hydroxy (Vit-D Deficiency, Fractures)   Meds ordered this encounter  Medications  . methotrexate 50 MG/2ML injection    Sig: Inject 1 mL (25 mg total) into the skin once a week.    Dispense:  12 mL    Refill:  0    Please dispense 2 mL vials with preservative     Face-to-face time spent with patient was 30 minutes. 50% of time was spent in counseling and coordination of care.  Follow-Up Instructions: Return in about 3 months (around 05/12/2017) for Systemic lupus.   Bo Merino, MD  Note - This record has been created using Editor, commissioning.  Chart creation errors have been sought, but may not always  have been located. Such creation errors do not reflect on  the standard of medical care.

## 2017-02-09 ENCOUNTER — Ambulatory Visit (INDEPENDENT_AMBULATORY_CARE_PROVIDER_SITE_OTHER): Payer: BLUE CROSS/BLUE SHIELD | Admitting: Rheumatology

## 2017-02-09 ENCOUNTER — Encounter: Payer: Self-pay | Admitting: Rheumatology

## 2017-02-09 ENCOUNTER — Telehealth: Payer: Self-pay | Admitting: Pharmacist

## 2017-02-09 VITALS — BP 130/89 | HR 89 | Resp 14 | Ht 64.0 in | Wt 175.0 lb

## 2017-02-09 DIAGNOSIS — E559 Vitamin D deficiency, unspecified: Secondary | ICD-10-CM

## 2017-02-09 DIAGNOSIS — F3289 Other specified depressive episodes: Secondary | ICD-10-CM | POA: Diagnosis not present

## 2017-02-09 DIAGNOSIS — I7300 Raynaud's syndrome without gangrene: Secondary | ICD-10-CM | POA: Diagnosis not present

## 2017-02-09 DIAGNOSIS — Z3042 Encounter for surveillance of injectable contraceptive: Secondary | ICD-10-CM

## 2017-02-09 DIAGNOSIS — M170 Bilateral primary osteoarthritis of knee: Secondary | ICD-10-CM | POA: Diagnosis not present

## 2017-02-09 DIAGNOSIS — G8929 Other chronic pain: Secondary | ICD-10-CM | POA: Diagnosis not present

## 2017-02-09 DIAGNOSIS — R8271 Bacteriuria: Secondary | ICD-10-CM | POA: Diagnosis not present

## 2017-02-09 DIAGNOSIS — Z79899 Other long term (current) drug therapy: Secondary | ICD-10-CM | POA: Diagnosis not present

## 2017-02-09 DIAGNOSIS — M545 Low back pain: Secondary | ICD-10-CM

## 2017-02-09 DIAGNOSIS — Z862 Personal history of diseases of the blood and blood-forming organs and certain disorders involving the immune mechanism: Secondary | ICD-10-CM | POA: Diagnosis not present

## 2017-02-09 DIAGNOSIS — M3219 Other organ or system involvement in systemic lupus erythematosus: Secondary | ICD-10-CM

## 2017-02-09 DIAGNOSIS — M329 Systemic lupus erythematosus, unspecified: Secondary | ICD-10-CM | POA: Diagnosis not present

## 2017-02-09 DIAGNOSIS — F5101 Primary insomnia: Secondary | ICD-10-CM | POA: Diagnosis not present

## 2017-02-09 DIAGNOSIS — E6609 Other obesity due to excess calories: Secondary | ICD-10-CM | POA: Diagnosis not present

## 2017-02-09 DIAGNOSIS — G4700 Insomnia, unspecified: Secondary | ICD-10-CM | POA: Diagnosis not present

## 2017-02-09 MED ORDER — METHOTREXATE SODIUM CHEMO INJECTION 50 MG/2ML: 25.0000 mg | INTRAMUSCULAR | 0 refills | Status: DC

## 2017-02-09 NOTE — Patient Instructions (Signed)
Increase methotrexate to 1 mL subcutaneous injections weekly Continue taking folic acid 2mg  by mouth daily Get labs in two weeks  We will apply for Benlysta through your insurance and will apply for copay card.   Standing Labs We placed an order today for your standing lab work.    Please come back and get your standing labs in 2 weeks then every 2 months  We have open lab Monday through Friday from 8:30-11:30 AM and 1:30-4 PM at the office of Dr. Tresa Moore, PA.   The office is located at [address], [place], [place], [place] [address] No appointment is necessary.   Labs are drawn by [facility].  You may receive a bill from [facility] for your lab work. If you have any questions regarding directions or hours of operation,  please call [phone].    Belimumab solution for injection What is this medicine? BELIMUMAB (be LIM ue mab) is a medicine used to treat active systemic lupus erythematosus (SLE). This medicine is used with standard therapy for SLE. It is not a cure. This medicine may be used for other purposes; ask your health care provider or pharmacist if you have questions. COMMON BRAND NAME(S): Rozanna Boer Gail What should I tell my health care provider before I take this medicine? They need to know if you have any of these conditions: -heart disease -immune system problems -infection (especially a virus infection such as chickenpox, cold sores, or herpes) -mental illness -recently received or scheduled to receive a vaccine -suicidal thoughts, plans, or attempt; a previous suicide attempt by you or a family member -an unusual or allergic reaction to belimumab, other medicines, foods, dyes, or preservatives -pregnant or trying to get pregnant -breast-feeding How should I use this medicine? This medicine is for infusion into a vein or for injection under the skin. Infusions are given by a health care professional in a hospital or clinic setting. If  you are to give your own medicine at home, you will be taught how to prepare and give this medicine under the skin. Use exactly as directed. Take your medicine at regular intervals. Do not take your medicine more often than directed. It is important that you put your used needles and syringes in a special sharps container. Do not put them in a trash can. If you do not have a sharps container, call your pharmacist or healthcare provider to get one. A special MedGuide will be given to you by the pharmacist with each prescription and refill. Be sure to read this information carefully each time. Talk to your pediatrician regarding the use of this medicine in children. Special care may be needed. Overdosage: If you think you have taken too much of this medicine contact a poison control center or emergency room at once. NOTE: This medicine is only for you. Do not share this medicine with others. What if I miss a dose? This medicine is used once a week if given by injection under the skin. If you miss a dose, take it as soon as you can. If it is almost time for your next dose, take only that dose. Do not take double or extra doses. If you are to be given an infusion, it is important not to miss your dose. Call your doctor or health care professional if you are unable to keep an appointment. What may interact with this medicine? Do not take this medicine with any of the following medications: -live virus vaccines This medicine may also  interact with the following medications: -cyclophosphamide -ocrelizumab -ofatumumab -rituximab This list may not describe all possible interactions. Give your health care provider a list of all the medicines, herbs, non-prescription drugs, or dietary supplements you use. Also tell them if you smoke, drink alcohol, or use illegal drugs. Some items may interact with your medicine. What should I watch for while using this medicine? Tell your doctor or healthcare professional  if your symptoms do not start to get better or if they get worse. In some patients, this medicine may cause a serious brain infection that may cause death. If you have any problems seeing, thinking, speaking, walking, or standing, tell your doctor right away. If you cannot reach your doctor, urgently seek other source of medical care. Talk to your doctor about your risk of cancer. You may be more at risk for certain types of cancers if you take this medicine. What side effects may I notice from receiving this medicine? Side effects that you should report to your doctor or health care professional as soon as possible: -allergic reactions like skin rash, itching or hives, swelling of the face, lips, or tongue -breathing problems -chest pain -depressed mood -dizziness -feeling faint or lightheaded -signs of infection - fever or chills, cough, sore throat, pain or difficulty passing urine -suicidal thoughts or other mood changes -trouble sleeping -unusually slow heartbeat Side effects that usually do not require medical attention (report to your doctor or health care professional if they continue or are bothersome): -diarrhea -headache -muscle aches -nausea -pain, redness or irritation at site of injection -stuffy or runny nose This list may not describe all possible side effects. Call your doctor for medical advice about side effects. You may report side effects to FDA at 1-800-FDA-1088. Where should I keep my medicine? Infusions will be given in a hospital or clinic and will not be stored at home. Storage for syringes and autoinjectors stored at home: Keep out of the reach of children. Store in a refrigerator between 2 and 8 degrees C (36 and 46 degrees F). Keep this medicine in the original container. Protect from light. Do not freeze. Do not shake. Do not use this medicine and do not place back in the refrigerator if left out at room temperature for more than 12 hours. Throw away any unused  medicine after the expiration date. NOTE: This sheet is a summary. It may not cover all possible information. If you have questions about this medicine, talk to your doctor, pharmacist, or health care provider.  2018 Elsevier/Gold Standard (2016-04-05 17:05:37)

## 2017-02-09 NOTE — Telephone Encounter (Signed)
Started paperwork for [employer].  A [name] score is required for [facility].  For a complete assessment using [name], patient will need spot urine for protein to creatinine ratio, urinalysis, complement.  Discussed with Dr. Estanislado Pandy who agreed to ordering the above labs.  Called patient to see if she could come in for blood work.  Left a message on her answering machine asking her to call us back.   [name], Pharm.D., BCPS, CPP Clinical Pharmacist Pager: [phone] Phone: [phone] 02/09/2017 6:02 PM

## 2017-02-09 NOTE — Progress Notes (Signed)
Pharmacy Note  Subjective: Patient presents today to the [facility] to see Dr. Estanislado Pandy.  Patient is currently taking methotrexate 0.8 mL weekly, folic acid 2 mg daily, and prednisone 5 mg daily and continues to have flare of lupus.  Decision was made to increase methotrexate to 1 mL weekly and to apply for Benlysta.  Patient seen by the pharmacist for counseling on Benlysta.    Objective: TB Test: negative (04/27/16) Hepatitis panel: negative (04/27/16) HIV: negative (01/29/16)  CBC    Component Value Date/Time   WBC 6.4 12/28/2016 1111   RBC 5.36 (H) 12/28/2016 1111   HGB 12.8 12/28/2016 1111   HCT 40.3 12/28/2016 1111   PLT 382 12/28/2016 1111   MCV 75.2 (L) 12/28/2016 1111   MCH 23.9 (L) 12/28/2016 1111   MCHC 31.8 (L) 12/28/2016 1111   RDW 20.5 (H) 12/28/2016 1111   LYMPHSABS 1,472 12/28/2016 1111   MONOABS 768 12/28/2016 1111   EOSABS 64 12/28/2016 1111   BASOSABS 0 12/28/2016 1111   CMP     Component Value Date/Time   NA 140 12/28/2016 1111   K 3.6 12/28/2016 1111   CL 106 12/28/2016 1111   CO2 25 12/28/2016 1111   GLUCOSE 81 12/28/2016 1111   BUN 9 12/28/2016 1111   CREATININE 0.68 12/28/2016 1111   CALCIUM 9.0 12/28/2016 1111   PROT 7.2 12/28/2016 1111   ALBUMIN 3.7 12/28/2016 1111   AST 19 12/28/2016 1111   ALT 22 12/28/2016 1111   ALKPHOS 81 12/28/2016 1111   BILITOT 0.4 12/28/2016 1111   GFRNONAA >89 12/28/2016 1111   GFRAA >89 12/28/2016 1111   Assessment/Plan:  Counseled patient that Benlysta is a monoclonal antibody that inhibits B lyphocytes.  Reviewed Benlysta dose of 200 mg subcutaneous every week.  Counseled patient on purpose, proper use, and adverse effects of Benlysta.  Reviewed the most common adverse effects including risk of infection, nausea, diarrhea, headache, and nasopharyngitis.  Reviewed that this medication may cause new or worsening of depression, anxiety, and/or insomnia.  Counseled that the first dose must be  administered by a healthcare provider due to risk of hypersensitivity/anaphylaxis.  Discussed that there is the possibility of an increased risk of malignancy.  Advised patient to get annual influenza vaccine and the pneumococcal vaccine if she has not had one.  Counseled patient to avoid live vaccines while on Benlysta.  Reviewed with patient that G[place]a [place] has not been studied in pregnancy, and patient confirms she is on depo-provera.  Provided patient with medication education material and answered all questions.  Patient voiced understanding.  Patient consented to [facility].  Will upload consent into the media tab.  Reviewed storage instructions of Benlysta.  Will apply for Benlysta through patient's insurance.  Will update patient once we know status of prior authorization/copay card.    Patient was counseled to increase methotrexate dose to 1 mL weekly and continue folic acid 2 mg daily.  Patient will be due for standing labs two weeks after increasing her methotrexate dose.    Patient has history of vitamin D deficiency (vitamin D level of 20 on 06/03/16).  Discussed vitamin D deficiency with Dr. Estanislado Pandy.  Will plan to recheck vitamin D level with next labs.    [name], Pharm.D., BCPS Clinical Pharmacist Pager: [phone] Phone: [phone] 02/09/2017 9:01 AM

## 2017-02-14 ENCOUNTER — Other Ambulatory Visit (HOSPITAL_COMMUNITY)
Admission: RE | Admit: 2017-02-14 | Discharge: 2017-02-14 | Disposition: A | Discharge status: Home / Self Care | Payer: BLUE CROSS/BLUE SHIELD | Source: Ambulatory Visit | Attending: Family Medicine | Admitting: Family Medicine

## 2017-02-14 ENCOUNTER — Other Ambulatory Visit: Payer: Self-pay | Admitting: Family Medicine

## 2017-02-14 DIAGNOSIS — E661 Drug-induced obesity: Secondary | ICD-10-CM | POA: Diagnosis not present

## 2017-02-14 DIAGNOSIS — Z01411 Encounter for gynecological examination (general) (routine) with abnormal findings: Secondary | ICD-10-CM | POA: Insufficient documentation

## 2017-02-14 DIAGNOSIS — I7300 Raynaud's syndrome without gangrene: Secondary | ICD-10-CM | POA: Diagnosis not present

## 2017-02-14 DIAGNOSIS — M329 Systemic lupus erythematosus, unspecified: Secondary | ICD-10-CM | POA: Diagnosis not present

## 2017-02-14 DIAGNOSIS — R87610 Atypical squamous cells of undetermined significance on cytologic smear of cervix (ASC-US): Secondary | ICD-10-CM | POA: Diagnosis not present

## 2017-02-14 DIAGNOSIS — G4700 Insomnia, unspecified: Secondary | ICD-10-CM | POA: Diagnosis not present

## 2017-02-16 LAB — CYTOLOGY - PAP: DIAGNOSIS: NEGATIVE

## 2017-02-17 ENCOUNTER — Other Ambulatory Visit: Payer: Self-pay | Admitting: Rheumatology

## 2017-02-17 ENCOUNTER — Other Ambulatory Visit: Payer: Self-pay

## 2017-02-17 DIAGNOSIS — M3219 Other organ or system involvement in systemic lupus erythematosus: Secondary | ICD-10-CM | POA: Diagnosis not present

## 2017-02-17 DIAGNOSIS — Z79899 Other long term (current) drug therapy: Secondary | ICD-10-CM

## 2017-02-17 DIAGNOSIS — E559 Vitamin D deficiency, unspecified: Secondary | ICD-10-CM

## 2017-02-17 LAB — CBC WITH DIFFERENTIAL/PLATELET
BASOS PCT: 1 %
Basophils Absolute: 68 cells/uL (ref 0–200)
EOS PCT: 1 %
Eosinophils Absolute: 68 cells/uL (ref 15–500)
HEMATOCRIT: 38.9 % (ref 35.0–45.0)
HEMOGLOBIN: 12.6 g/dL (ref 11.7–15.5)
LYMPHS ABS: 1292 {cells}/uL (ref 850–3900)
Lymphocytes Relative: 19 %
MCH: 24.8 pg — ABNORMAL LOW (ref 27.0–33.0)
MCHC: 32.4 g/dL (ref 32.0–36.0)
MCV: 76.6 fL — ABNORMAL LOW (ref 80.0–100.0)
MONO ABS: 884 {cells}/uL (ref 200–950)
Monocytes Relative: 13 %
NEUTROS ABS: 4488 {cells}/uL (ref 1500–7800)
Neutrophils Relative %: 66 %
Platelets: 328 10*3/uL (ref 140–400)
RBC: 5.08 MIL/uL (ref 3.80–5.10)
RDW: 20.1 % — ABNORMAL HIGH (ref 11.0–15.0)
WBC: 6.8 10*3/uL (ref 3.8–10.8)

## 2017-02-18 LAB — VITAMIN D 25 HYDROXY (VIT D DEFICIENCY, FRACTURES): Vit D, 25-Hydroxy: 20 ng/mL — ABNORMAL LOW (ref 30–100)

## 2017-02-18 LAB — COMPLETE METABOLIC PANEL WITH GFR
ALBUMIN: 3.7 g/dL (ref 3.6–5.1)
ALK PHOS: 81 U/L (ref 33–115)
ALT: 22 U/L (ref 6–29)
AST: 19 U/L (ref 10–30)
BUN: 7 mg/dL (ref 7–25)
CALCIUM: 8.8 mg/dL (ref 8.6–10.2)
CO2: 19 mmol/L — ABNORMAL LOW (ref 20–31)
Chloride: 107 mmol/L (ref 98–110)
Creat: 0.56 mg/dL (ref 0.50–1.10)
GFR, Est African American: >89 mL/min (ref >=60)
GFR, Est Non African American: >89 mL/min (ref >=60)
GLUCOSE: 75 mg/dL (ref 65–99)
POTASSIUM: 3.8 mmol/L (ref 3.5–5.3)
SODIUM: 140 mmol/L (ref 135–146)
Total Bilirubin: 0.4 mg/dL (ref 0.2–1.2)
Total Protein: 6.8 g/dL (ref 6.1–8.1)

## 2017-02-18 LAB — URINALYSIS, MICROSCOPIC ONLY
Crystals: NONE SEEN [HPF]
RBC / HPF: NONE SEEN RBC/HPF (ref <=2)
Yeast: NONE SEEN [HPF]

## 2017-02-18 LAB — URINALYSIS, ROUTINE W REFLEX MICROSCOPIC
BILIRUBIN URINE: NEGATIVE
GLUCOSE, UA: NEGATIVE
Hgb urine dipstick: NEGATIVE
KETONES UR: NEGATIVE
Leukocytes, UA: NEGATIVE
Nitrite: NEGATIVE
SPECIFIC GRAVITY, URINE: 1.019 (ref 1.001–1.035)
pH: 6.5 (ref 5.0–8.0)

## 2017-02-18 LAB — SEDIMENTATION RATE: Sed Rate: 17 mm/hr (ref 0–20)

## 2017-02-20 LAB — ANTI-DNA ANTIBODY, DOUBLE-STRANDED: ds DNA Ab: 13 IU/mL — ABNORMAL HIGH

## 2017-02-21 LAB — C3 AND C4
C3 Complement: 85 mg/dL (ref 83–193)
C4 COMPLEMENT: 8 mg/dL — AB (ref 15–57)

## 2017-02-21 NOTE — Progress Notes (Signed)
Her serologies is stable. She has hypocomplementemia. He is a schedule a follow-up appointment in 3 months. For vitamin D deficiency she did start vitamin D 50,000 units twice a week. Total 90 days supply. We will check vitamin D levels in 3 months

## 2017-02-22 ENCOUNTER — Telehealth: Payer: Self-pay | Admitting: Radiology

## 2017-02-22 MED ORDER — VITAMIN D3 1.25 MG (50000 UT) PO CAPS: 50000.0000 [IU] | ORAL_CAPSULE | ORAL | 0 refills | Status: AC

## 2017-02-22 NOTE — Telephone Encounter (Signed)
Called patient to advise, have sent in the Vitamin d left message for patient to advise.  Sent my chart

## 2017-02-22 NOTE — Telephone Encounter (Signed)
-----   Message from Bo Merino, MD sent at 02/21/2017  5:02 PM EDT ----- Her serologies is stable. She has hypocomplementemia. He is a schedule a follow-up appointment in 3 months. For vitamin D deficiency she did start vitamin D 50,000 units twice a week. Total 90 days supply. We will check vitamin D levels in 3 months

## 2017-02-27 ENCOUNTER — Telehealth: Payer: Self-pay | Admitting: Pharmacist

## 2017-02-27 LAB — PROTEIN CREATININE INDEX
Creatinine, Urine: 244 mg/dL (ref 20–320)
Protein/Creat Ratio: 707 — ABNORMAL HIGH (ref 0–125)
Total Protein, Urine: 153 mg/dL — ABNORMAL HIGH (ref 5–24)

## 2017-02-27 NOTE — Telephone Encounter (Signed)
Assisted in completion of prior authorization.  Patient's protein excretion rate was needed to submit prior authorization.  I calculated patient's estimated protein excretion rate of 0.84 g/day using UpToDate's estimated protein excretion rate from spot urine protein and creatinine for adults.    [name], Pharm.D., BCPS, CPP Clinical Pharmacist Pager: [phone] Phone: [phone] 02/27/2017 3:26 PM

## 2017-02-27 NOTE — Progress Notes (Signed)
Pt has mild proteinuria. Recommend referral to nephrology.

## 2017-02-27 NOTE — Telephone Encounter (Signed)
Patient had labs drawn.  [name] score was calculated as 16 (see attached).  Benlysta [employer] application was faxed to [phone].   Can you finish prior authorization for Benlysta? Thanks!

## 2017-02-27 NOTE — Telephone Encounter (Signed)
A prior authorization for Benlysta has been submitted via cover my meds. Will update once we receive a response.   Yarah Fuente, [place], CPhT 2:44 PM

## 2017-02-28 ENCOUNTER — Other Ambulatory Visit: Payer: Self-pay | Admitting: Rheumatology

## 2017-02-28 ENCOUNTER — Telehealth: Payer: Self-pay | Admitting: Radiology

## 2017-02-28 NOTE — Telephone Encounter (Signed)
ok 

## 2017-02-28 NOTE — Telephone Encounter (Signed)
-----   Message from Altamese Dilling sent at 02/28/2017  2:50 PM EDT ----- Left a vm message for patient to call the office to schedule her fu appointment. ----- Message ----- From: Candice Camp, RT Sent: 02/22/2017  10:40 AM To: Milda Smart Admin  Patient needs a 3 month  Follow up with Dr Estanislado Pandy

## 2017-02-28 NOTE — Telephone Encounter (Signed)
Last visit: 02/09/17 Next Visit due in August 2018. Message sent to the front to schedule patient.   Okay to refill Lexapro?

## 2017-03-01 ENCOUNTER — Telehealth: Payer: Self-pay

## 2017-03-01 ENCOUNTER — Telehealth: Payer: Self-pay | Admitting: Rheumatology

## 2017-03-01 ENCOUNTER — Telehealth: Payer: Self-pay | Admitting: *Deleted

## 2017-03-01 NOTE — Telephone Encounter (Signed)
I called [employer]. They confirm they received patient's application to enroll her in the copay card program, but they were missing the page with patient's signature.  I re-faxed the page with patient's signature and included her record X [id number].  They state application should be processed in 24-48 hours.    [name], Pharm.D., BCPS, CPP Clinical Pharmacist Pager: [phone] Phone: [phone] 03/01/2017 5:13 PM

## 2017-03-01 NOTE — Telephone Encounter (Signed)
Received a confirmation through cover my meds regarding a prior authorization approval for Benlysta from 02/27/17 to 02/26/2018.  Key: [id number]  [name] [name], [place], CPhT 12:08 PM

## 2017-03-01 NOTE — Telephone Encounter (Signed)
Tonya left a message today, but I could not understand where she was from and what she is needing.   CB#[phone].  Thank you.

## 2017-03-01 NOTE — Telephone Encounter (Signed)
-----   Message from Altamese Dilling sent at 03/01/2017  2:49 PM EDT ----- Regarding: RE: Please schedule patient for follow up visit Left a vm mail message for the patient to call the office back to schedule her fu. ----- Message ----- From: Carole Binning, LPN Sent: 8/88/7579   8:46 AM To: Milda Smart Admin Subject: Please schedule patient for follow up visit    Please schedule patient for follow up visit. Patient due for visit August 2018. Thanks!

## 2017-03-02 NOTE — Telephone Encounter (Signed)
Called patient to discuss Benlysta approval.  Left message asking her to call me back.   [name], Pharm.D., BCPS, CPP Clinical Pharmacist Pager: [phone] Phone: [phone] 03/02/2017 12:07 PM

## 2017-03-06 ENCOUNTER — Telehealth: Payer: Self-pay | Admitting: *Deleted

## 2017-03-06 ENCOUNTER — Telehealth: Payer: Self-pay

## 2017-03-06 DIAGNOSIS — M3219 Other organ or system involvement in systemic lupus erythematosus: Secondary | ICD-10-CM

## 2017-03-06 DIAGNOSIS — R808 Other proteinuria: Secondary | ICD-10-CM

## 2017-03-06 NOTE — Telephone Encounter (Signed)
Received a fax from [facility] co-pay program. Patient has been approved through 03/01/18. The program will pay co-pay or coinsurance for Benlysta up to a maximum of $11,000 annually.   Member ID: [id number] BIN: [id number] GROUP: [id number]  Phone number: [phone]  Will send documents to scan center.  Marlow Hendrie, [place], CPhT 11:07 AM

## 2017-03-06 NOTE — Telephone Encounter (Signed)
-----   Message from Bo Merino, MD sent at 02/27/2017 12:34 PM EDT ----- Pt has mild proteinuria. Recommend referral to nephrology.

## 2017-03-06 NOTE — Telephone Encounter (Signed)
Patient advised of lab results and referral placed.

## 2017-03-07 ENCOUNTER — Telehealth: Payer: Self-pay | Admitting: Rheumatology

## 2017-03-07 ENCOUNTER — Ambulatory Visit: Payer: BLUE CROSS/BLUE SHIELD | Admitting: Rheumatology

## 2017-03-07 NOTE — Telephone Encounter (Signed)
Patient has been approved for [facility].   Member ID: [id number] [place] #: [id number] [employer] #: [id number]  I attempted to call patient again to discuss Benlysta.  I left a voicemail asking her to call me back.   [name], Pharm.D., BCPS, CPP Clinical Pharmacist Pager: [phone] Phone: [phone] 03/07/2017 2:48 PM

## 2017-03-07 NOTE — Telephone Encounter (Signed)
-----   Message from Carole Binning, LPN sent at 5/45/6256  8:46 AM EDT ----- Regarding: Please schedule patient for follow up visit Please schedule patient for follow up visit. Patient due for visit August 2018. Thanks!

## 2017-03-07 NOTE — Telephone Encounter (Signed)
LMOM for patient to call, and schedule August follow up appt.

## 2017-03-10 MED ORDER — BELIMUMAB 200 MG/ML ~~LOC~~ SOAJ: 200.0000 mg | SUBCUTANEOUS | 0 refills | Status: DC

## 2017-03-10 NOTE — Addendum Note (Signed)
Addended by: Cyndia Skeeters on: 03/10/2017 11:38 AM   Modules accepted: Orders

## 2017-03-10 NOTE — Telephone Encounter (Signed)
I spoke to patient.  Advised that [place] [place] was approved by her insurance and we have copay card set up.  Patient [facility].  Provided patient with [facility] phone number [phone]).  Advised we will have initial fill delivered to clinic for clinic administration.  Once we receive medication, we will set up nurse visit for initial injection.  Patient voiced understanding.    [name], Pharm.D., BCPS, CPP Clinical Pharmacist Pager: [phone] Phone: [phone] 03/10/2017 11:33 AM

## 2017-03-20 ENCOUNTER — Telehealth: Payer: Self-pay | Admitting: Rheumatology

## 2017-03-20 NOTE — Telephone Encounter (Signed)
[facility] pharmacy called stating that they are not in network with the patient's insurance ([employer]).

## 2017-03-21 MED ORDER — BELIMUMAB 200 MG/ML ~~LOC~~ SOAJ: 200.0000 mg | SUBCUTANEOUS | 0 refills | Status: DC

## 2017-03-21 NOTE — Telephone Encounter (Signed)
I spoke to [facility] who reports Development worker, community [facility] [phone], Fax [phone]) is the required specialty pharmacy.  Prescription was resent to [facility].  I tried to call patient to discuss this information, but there was no answer.  Will send inbasket message to patient to update her and with information for [facility].    [name], Pharm.D., BCPS, CPP Clinical Pharmacist Pager: [phone] Phone: [phone] 03/21/2017 9:00 AM

## 2017-03-27 ENCOUNTER — Other Ambulatory Visit: Payer: Self-pay | Admitting: Rheumatology

## 2017-03-28 ENCOUNTER — Other Ambulatory Visit: Payer: Self-pay | Admitting: Rheumatology

## 2017-03-28 ENCOUNTER — Other Ambulatory Visit: Payer: Self-pay | Admitting: *Deleted

## 2017-03-28 MED ORDER — PREDNISONE 5 MG PO TABS: 5.0000 mg | ORAL_TABLET | Freq: Every day | ORAL | 2 refills | Status: DC

## 2017-03-28 NOTE — Telephone Encounter (Signed)
Last Visit: 02/09/17 Next visit: 04/13/17  Okay to refill per Dr. Estanislado Pandy

## 2017-03-28 NOTE — Telephone Encounter (Signed)
Last Visit: 02/09/17 Next visit: 04/13/17  Okay to refill per Dr. Estanislado Pandy  Patient was also given the number to contact the speciality pharmacy for delivery of Benlysta. Patient advised would schedule a nurse visit once it has been delivered to office. Patient verbalized understanding. Patient wanted to know if she would continue to be on prednisone after she starts the [facility]. Patient advised that we can discuss that at her visit for the injection. Patient advised that she may be on it for a short time after she starts the [facility] while the medication gets into her system.

## 2017-04-04 ENCOUNTER — Telehealth: Payer: Self-pay | Admitting: Pharmacist

## 2017-04-04 NOTE — Telephone Encounter (Signed)
I called [name] [name] to follow up on starting Benlysta.  She reports she talked to the pharmacy last week to set up delivery of her medication.  I informed her we have not received the medication nor heard from the pharmacy regarding delivery of the medication.    I spoke to [name] [name] at [facility] regarding patient's prescription.  Prescription was set up for delivery on Wednesday, July 25th.  I called patient and scheduled nurse visit for initial injection on Thursday, 04/06/17 at 10:00 AM.    [name], Pharm.D., BCPS, CPP Clinical Pharmacist Pager: [phone] Phone: [phone] 04/04/2017 1:49 PM

## 2017-04-05 ENCOUNTER — Telehealth: Payer: Self-pay | Admitting: Pharmacist

## 2017-04-05 NOTE — Telephone Encounter (Signed)
Received patient's Benlysta from [facility].  I made patient aware.  She is scheduled for her nurse visit tomorrow, 04/06/17 at 10:00 AM.   [name], Pharm.D., BCPS, CPP Clinical Pharmacist Pager: [phone] Phone: [phone] 04/05/2017 12:52 PM

## 2017-04-06 ENCOUNTER — Ambulatory Visit (INDEPENDENT_AMBULATORY_CARE_PROVIDER_SITE_OTHER): Payer: BLUE CROSS/BLUE SHIELD | Admitting: *Deleted

## 2017-04-06 VITALS — BP 134/94 | HR 84 | Wt 166.0 lb

## 2017-04-06 DIAGNOSIS — M059 Rheumatoid arthritis with rheumatoid factor, unspecified: Secondary | ICD-10-CM

## 2017-04-06 DIAGNOSIS — Z79899 Other long term (current) drug therapy: Secondary | ICD-10-CM | POA: Diagnosis not present

## 2017-04-06 DIAGNOSIS — M329 Systemic lupus erythematosus, unspecified: Secondary | ICD-10-CM

## 2017-04-06 MED ORDER — BELIMUMAB 200 MG/ML ~~LOC~~ SOAJ
200.0000 mg | Freq: Once | SUBCUTANEOUS | Status: AC
  Administered 2017-04-06: 200 mg via SUBCUTANEOUS

## 2017-04-06 NOTE — Progress Notes (Signed)
Pharmacy Note  Subjective:   Patient is being initiated on Benlysta.  Patient was previously counseled extensively on Benlysta on 02/09/17 and consented to initiation of Benlysta at that time.  Patient presents to clinic today to receive the first dose of Belysta.  Patient denies any active infection.    Objective: CMP     Component Value Date/Time   NA 140 02/17/2017 1201   K 3.8 02/17/2017 1201   CL 107 02/17/2017 1201   CO2 19 (L) 02/17/2017 1201   GLUCOSE 75 02/17/2017 1201   BUN 7 02/17/2017 1201   CREATININE 0.56 02/17/2017 1201   CALCIUM 8.8 02/17/2017 1201   PROT 6.8 02/17/2017 1201   ALBUMIN 3.7 02/17/2017 1201   AST 19 02/17/2017 1201   ALT 22 02/17/2017 1201   ALKPHOS 81 02/17/2017 1201   BILITOT 0.4 02/17/2017 1201   GFRNONAA >89 02/17/2017 1201   GFRAA >89 02/17/2017 1201   CBC    Component Value Date/Time   WBC 6.8 02/17/2017 1201   RBC 5.08 02/17/2017 1201   HGB 12.6 02/17/2017 1201   HCT 38.9 02/17/2017 1201   PLT 328 02/17/2017 1201   MCV 76.6 (L) 02/17/2017 1201   MCH 24.8 (L) 02/17/2017 1201   MCHC 32.4 02/17/2017 1201   RDW 20.1 (H) 02/17/2017 1201   LYMPHSABS 1,292 02/17/2017 1201   MONOABS 884 02/17/2017 1201   EOSABS 68 02/17/2017 1201   BASOSABS 68 02/17/2017 1201   TB Gold: negative (04/27/16)  Assessment/Plan:  Patient was counseled on how to administer subcutaneous Benlysta injection using a demonstration pen.  Patient received her first dose of Benlysta in the right thigh.  Lot: [id number], Exp. 06/2019.  Patient was monitored for 30 minutes post injection.  No injection site reaction noted.  Patient will need standing lab orders in one month.  Provided patient with standing lab instructions and placed standing lab order.  Patient has follow up scheduled for 04/13/17.    [name], Pharm.D., BCPS Clinical Pharmacist Pager: [phone] Phone: [phone] 04/06/2017 10:40 AM

## 2017-04-06 NOTE — Progress Notes (Signed)
Patient in office for a new start to [facility]. Patient provided medication. Patient was given injection in right thigh. Patient tolerated injection well. Patient was monitored in office for 30 minutes after administration for adverse reactions. No adverse reactions noted.   Administrations This Visit    Belimumab SOAJ 200 mg    Admin Date 04/06/2017 Action Given Dose 200 mg Route Subcutaneous Administered By Carole Binning, LPN

## 2017-04-06 NOTE — Patient Instructions (Addendum)
Standing Labs We placed an order today for your standing lab work.    Please come back and get your standing labs in 1 month  We have open lab Monday through Friday from 8:30-11:30 AM and 1:30-4 PM at the office of Dr. Bo Merino.   The office is located at [address] [place], [place], [place] [address] No appointment is necessary.   Labs are drawn by [facility].  You may receive a bill from [facility] for your lab work. If you have any questions regarding directions or hours of operation,  please call [phone].    Belimumab solution for injection What is this medicine? BELIMUMAB (be LIM ue mab) is a medicine used to treat active systemic lupus erythematosus (SLE). This medicine is used with standard therapy for SLE. It is not a cure. This medicine may be used for other purposes; ask your health care provider or pharmacist if you have questions. COMMON BRAND NAME(S): Rozanna Boer [place] What should I tell my health care provider before I take this medicine? They need to know if you have any of these conditions: -heart disease -immune system problems -infection (especially a virus infection such as chickenpox, cold sores, or herpes) -mental illness -recently received or scheduled to receive a vaccine -suicidal thoughts, plans, or attempt; a previous suicide attempt by you or a family member -an unusual or allergic reaction to belimumab, other medicines, foods, dyes, or preservatives -pregnant or trying to get pregnant -breast-feeding How should I use this medicine? This medicine is for infusion into a vein or for injection under the skin. Infusions are given by a health care professional in a hospital or clinic setting. If you are to give your own medicine at home, you will be taught how to prepare and give this medicine under the skin. Use exactly as directed. Take your medicine at regular intervals. Do not take your medicine more often than directed. It is important that  you put your used needles and syringes in a special sharps container. Do not put them in a trash can. If you do not have a sharps container, call your pharmacist or healthcare provider to get one. A special MedGuide will be given to you by the pharmacist with each prescription and refill. Be sure to read this information carefully each time. Talk to your pediatrician regarding the use of this medicine in children. Special care may be needed. Overdosage: If you think you have taken too much of this medicine contact a poison control center or emergency room at once. NOTE: This medicine is only for you. Do not share this medicine with others. What if I miss a dose? This medicine is used once a week if given by injection under the skin. If you miss a dose, take it as soon as you can. If it is almost time for your next dose, take only that dose. Do not take double or extra doses. If you are to be given an infusion, it is important not to miss your dose. Call your doctor or health care professional if you are unable to keep an appointment. What may interact with this medicine? Do not take this medicine with any of the following medications: -live virus vaccines This medicine may also interact with the following medications: -cyclophosphamide -ocrelizumab -ofatumumab -rituximab This list may not describe all possible interactions. Give your health care provider a list of all the medicines, herbs, non-prescription drugs, or dietary supplements you use. Also tell them if you smoke, drink alcohol,  or use illegal drugs. Some items may interact with your medicine. What should I watch for while using this medicine? Tell your doctor or healthcare professional if your symptoms do not start to get better or if they get worse. In some patients, this medicine may cause a serious brain infection that may cause death. If you have any problems seeing, thinking, speaking, walking, or standing, tell your doctor right  away. If you cannot reach your doctor, urgently seek other source of medical care. Talk to your doctor about your risk of cancer. You may be more at risk for certain types of cancers if you take this medicine. What side effects may I notice from receiving this medicine? Side effects that you should report to your doctor or health care professional as soon as possible: -allergic reactions like skin rash, itching or hives, swelling of the face, lips, or tongue -breathing problems -chest pain -depressed mood -dizziness -feeling faint or lightheaded -signs of infection - fever or chills, cough, sore throat, pain or difficulty passing urine -suicidal thoughts or other mood changes -trouble sleeping -unusually slow heartbeat Side effects that usually do not require medical attention (report to your doctor or health care professional if they continue or are bothersome): -diarrhea -headache -muscle aches -nausea -pain, redness or irritation at site of injection -stuffy or runny nose This list may not describe all possible side effects. Call your doctor for medical advice about side effects. You may report side effects to FDA at 1-800-FDA-1088. Where should I keep my medicine? Infusions will be given in a hospital or clinic and will not be stored at home. Storage for syringes and autoinjectors stored at home: Keep out of the reach of children. Store in a refrigerator between 2 and 8 degrees C (36 and 46 degrees F). Keep this medicine in the original container. Protect from light. Do not freeze. Do not shake. Do not use this medicine and do not place back in the refrigerator if left out at room temperature for more than 12 hours. Throw away any unused medicine after the expiration date. NOTE: This sheet is a summary. It may not cover all possible information. If you have questions about this medicine, talk to your doctor, pharmacist, or health care provider.  2018 Elsevier/Gold Standard (2016-04-05  17:05:37)

## 2017-04-10 NOTE — Progress Notes (Signed)
Office Visit Note  Patient: [name] [name]             Date of Birth: [birth year]           MRN: [id number]             PCP: Patient, No Pcp Per Referring: No ref. provider found Visit Date: 04/13/2017 Occupation: @GUAROCC @    Subjective:  Fatigue, pain feet.   History of Present Illness: [name] [name] is a 21 y.o. female with history of systemic lupus or dermatosis. She states she's not noticed much improvement on the last of which she started about a week ago. She had her second dose today. She continues to have some fatigue. She also complains of some discomfort in her feet. She denies any joint swelling.  Activities of Daily Living:  Patient reports morning stiffness for 20 minutes.   Patient Denies nocturnal pain.  Difficulty dressing/grooming: Denies Difficulty climbing stairs: Denies Difficulty getting out of chair: Denies Difficulty using hands for taps, buttons, cutlery, and/or writing: Denies   Review of Systems  Constitutional: Positive for fatigue and weight loss. Negative for night sweats, weight gain and weakness.  HENT: Negative for mouth sores, trouble swallowing, trouble swallowing, mouth dryness and nose dryness.        Intermittent nasal ulcers  Eyes: Negative for pain, redness, visual disturbance and dryness.  Respiratory: Negative for cough, shortness of breath and difficulty breathing.   Cardiovascular: Negative for chest pain, palpitations, hypertension, irregular heartbeat and swelling in legs/feet.  Gastrointestinal: Negative for blood in stool, constipation and diarrhea.  Endocrine: Negative for increased urination.  Genitourinary: Negative for vaginal dryness.  Musculoskeletal: Positive for arthralgias, joint pain and morning stiffness. Negative for joint swelling, myalgias, muscle weakness, muscle tenderness and myalgias.  Skin: Positive for color change. Negative for rash, hair loss, skin tightness, ulcers and sensitivity to sunlight.    Allergic/Immunologic: Negative for susceptible to infections.  Neurological: Negative for dizziness, memory loss and night sweats.  Hematological: Negative for swollen glands.  Psychiatric/Behavioral: Negative for depressed mood and sleep disturbance. The patient is not nervous/anxious.     PMFS History:  Patient Active Problem List   Diagnosis Date Noted  . Depression 02/08/2017  . Primary osteoarthritis of both knees 11/09/2016  . Chronic midline low back pain without sciatica 11/09/2016  . Vitamin D deficiency 11/09/2016  . SLE (systemic lupus erythematosus) ([place]) 09/19/2016  . High risk medication use 09/19/2016  . Insomnia 01/29/2016  . Depo-Provera contraceptive status 08/11/2015  . Raynaud's disease 08/11/2015  . Seasonal allergies 08/11/2015  . IDA (iron deficiency anemia) 08/11/2015    Past Medical History:  Diagnosis Date  . Lupus   . Raynaud disease     Family History  Problem Relation Age of Onset  . Aneurysm Mother   . Stroke Sister    Past Surgical History:  Procedure Laterality Date  . FRACTURE SURGERY    . TONSILLECTOMY     Social History   Social History Narrative  . No narrative on file     Objective: Vital Signs: BP (!) 139/102 (BP Location: Left Arm, Patient Position: Sitting, Cuff Size: Normal)   Pulse 76   Ht 5\' 3"  (1.6 m)   Wt 166 lb (75.3 kg)   BMI 29.41 kg/m    Physical Exam  Constitutional: She is oriented to person, place, and time. She appears well-developed and well-nourished.  HENT:  Head: Normocephalic and atraumatic.  Eyes: Conjunctivae and EOM are normal.  Neck: Normal range of motion.  Cardiovascular: Normal rate, regular rhythm, normal heart sounds and intact distal pulses.   Pulmonary/Chest: Effort normal and breath sounds normal.  Abdominal: Soft. Bowel sounds are normal.  Lymphadenopathy:    She has no cervical adenopathy.  Neurological: She is alert and oriented to person, place, and time.  Skin: Skin is warm and  dry. Capillary refill takes less than 2 seconds.  Psychiatric: She has a normal mood and affect. Her behavior is normal.  Nursing note and vitals reviewed.    Musculoskeletal Exam: C-spine and thoracic lumbar spine good range of motion. She is some discomfort range of motion of her lumbar spine. Shoulder joints although joints wrist joint MCPs PIPs DIPs with good range of motion. No synovitis was noted. She is some tenderness over few of her MCPs and MTPs joints. Hip joints knee joints ankles MTPs PIPs with good range of motion. She is tenderness across her MTP joints.  CDAI Exam: CDAI Homunculus Exam:   Joint Counts:  CDAI Tender Joint count: 0 CDAI Swollen Joint count: 0  Global Assessments:  Patient Global Assessment: 5 Provider Global Assessment: 5  CDAI Calculated Score: 10    Investigation: No additional findings. CBC Latest Ref Rng & Units 02/17/2017 12/28/2016 10/26/2016  WBC 3.8 - 10.8 K/uL 6.8 6.4 5.7  Hemoglobin 11.7 - 15.5 g/dL 12.6 12.8 12.3  Hematocrit 35.0 - 45.0 % 38.9 40.3 39.0  Platelets 140 - 400 K/uL 328 382 422(H)   CMP Latest Ref Rng & Units 02/17/2017 12/28/2016 10/26/2016  Glucose 65 - 99 mg/dL 75 81 82  BUN 7 - 25 mg/dL 7 9 7   Creatinine 0.50 - 1.10 mg/dL 0.56 0.68 0.64  Sodium 135 - 146 mmol/L 140 140 141  Potassium 3.5 - 5.3 mmol/L 3.8 3.6 3.7  Chloride 98 - 110 mmol/L 107 106 107  CO2 20 - 31 mmol/L 19(L) 25 24  Calcium 8.6 - 10.2 mg/dL 8.8 9.0 9.0  Total Protein 6.1 - 8.1 g/dL 6.8 7.2 7.1  Total Bilirubin 0.2 - 1.2 mg/dL 0.4 0.4 0.3  Alkaline Phos 33 - 115 U/L 81 81 58  AST 10 - 30 U/L 19 19 15   ALT 6 - 29 U/L 22 22 11     Imaging: No results found.  Speciality Comments: No specialty comments available.    Procedures:  No procedures performed Allergies: Patient has no known allergies.   Assessment / Plan:     Visit Diagnoses: Other systemic lupus erythematosus with other organ involvement (HCC) - Positive ANA, double-stranded DNA, Ro, RNP,  RF, history of nasal ulcers, fatigue, anemia, arthritis, Raynauds.She continues to have some fatigue and nasal ulcers. She also has intermittent rash. She has some arthralgias in her feet. She has not noticed much improvement on the last of which was a started about a week ago. She's been taking methotrexate along with that. She is still on prednisone 5 mg. I will check her labs in 1 month and then autoimmune labs in about 4 months from now. - Plan: Urinalysis, Routine w reflex microscopic, ANA, Anti-DNA antibody, double-stranded, C3 and C4, Sedimentation rate  High risk medication use - Methotrexate 1 mL subcutaneous every week, folic acid 2 mg by mouth daily, Benlysta 200 mg subcutaneous every week (03/21/2017), prednisone 5 mg by mouth daily (PLQ-rash) - Plan: CBC with Differential/Platelet, COMPLETE METABOLIC PANEL WITH GFR  Raynaud's disease without gangrene: Continues to be an issue  Primary osteoarthritis of both knees: She's not having discomfort currently  DDD (degenerative disc disease), lumbar/ facet joint arthropathy  Primary insomnia  Vitamin D deficiency: She is on supplement  History of depression - on Lexapro. Doing better.  History of anemia  Depo-Provera contraceptive status    Orders: Orders Placed This Encounter  Procedures  . CBC with Differential/Platelet  . COMPLETE METABOLIC PANEL WITH GFR  . Urinalysis, Routine w reflex microscopic  . ANA  . Anti-DNA antibody, double-stranded  . C3 and C4  . Sedimentation rate   Meds ordered this encounter  Medications  . Belimumab (BENLYSTA) 200 MG/ML SOAJ    Sig: Inject 200 mg into the skin once a week.    Dispense:  4 Syringe    Refill:  2    Face-to-face time spent with patient was 20 minutes. 50% of time was spent in counseling and coordination of care.  Follow-Up Instructions: Return in about 3 months (around 07/14/2017) for Systemic lupus.   Bo Merino, MD  Note - This record has been created using  Editor, commissioning.  Chart creation errors have been sought, but may not always  have been located. Such creation errors do not reflect on  the standard of medical care.

## 2017-04-13 ENCOUNTER — Ambulatory Visit (INDEPENDENT_AMBULATORY_CARE_PROVIDER_SITE_OTHER): Payer: BLUE CROSS/BLUE SHIELD | Admitting: Rheumatology

## 2017-04-13 ENCOUNTER — Encounter: Payer: Self-pay | Admitting: Rheumatology

## 2017-04-13 VITALS — BP 139/102 | HR 76 | Ht 63.0 in | Wt 166.0 lb

## 2017-04-13 DIAGNOSIS — Z3042 Encounter for surveillance of injectable contraceptive: Secondary | ICD-10-CM | POA: Diagnosis not present

## 2017-04-13 DIAGNOSIS — Z862 Personal history of diseases of the blood and blood-forming organs and certain disorders involving the immune mechanism: Secondary | ICD-10-CM | POA: Diagnosis not present

## 2017-04-13 DIAGNOSIS — I7300 Raynaud's syndrome without gangrene: Secondary | ICD-10-CM | POA: Diagnosis not present

## 2017-04-13 DIAGNOSIS — E559 Vitamin D deficiency, unspecified: Secondary | ICD-10-CM | POA: Diagnosis not present

## 2017-04-13 DIAGNOSIS — F5101 Primary insomnia: Secondary | ICD-10-CM

## 2017-04-13 DIAGNOSIS — M3219 Other organ or system involvement in systemic lupus erythematosus: Secondary | ICD-10-CM

## 2017-04-13 DIAGNOSIS — M5136 Other intervertebral disc degeneration, lumbar region: Secondary | ICD-10-CM

## 2017-04-13 DIAGNOSIS — Z8659 Personal history of other mental and behavioral disorders: Secondary | ICD-10-CM

## 2017-04-13 DIAGNOSIS — Z79899 Other long term (current) drug therapy: Secondary | ICD-10-CM

## 2017-04-13 DIAGNOSIS — M170 Bilateral primary osteoarthritis of knee: Secondary | ICD-10-CM

## 2017-04-13 MED ORDER — BELIMUMAB 200 MG/ML ~~LOC~~ SOAJ: 200.0000 mg | SUBCUTANEOUS | 2 refills | Status: DC

## 2017-04-13 NOTE — Progress Notes (Signed)
Rheumatology Medication Review by a Pharmacist Does the patient feel that his/her medications are working for him/her?  Patient started Benlysta one week ago.  She has not noticed much difference on the medication yet.   Has the patient been experiencing any side effects to the medications prescribed?  No Does the patient have any problems obtaining medications?  No  Issues to address at subsequent visits: None   Pharmacist comments:  Lennan is a pleasant 21 yo F who presents for follow up.  She is currently taking methotrexate 1 mL weekly, folic acid 2 mg daily, prednisone 5 mg daily, and Benlysta 200 mg weekly.  She started Benlysta last week.  She confirms she took her second dose this morning and denies any problems with the self-injection.  Patient denies any questions or concerns regarding her medications at this time.   [name], Pharm.D., BCPS, CPP Clinical Pharmacist Pager: [phone] Phone: [phone] 04/13/2017 11:36 AM

## 2017-04-13 NOTE — Patient Instructions (Signed)
Standing Labs We placed an order today for your standing lab work.    Please come back and get your standing labs in September and December  We have open lab Monday through Friday from 8:30-11:30 AM and 1:30-4 PM at the office of Dr. Bo Merino.   The office is located at [address], [place], [place], [place] [address] No appointment is necessary.   Labs are drawn by [facility].  You may receive a bill from [facility] for your lab work. If you have any questions regarding directions or hours of operation,  please call [phone].

## 2017-04-25 ENCOUNTER — Other Ambulatory Visit: Payer: Self-pay | Admitting: Rheumatology

## 2017-04-25 NOTE — Telephone Encounter (Signed)
Last Visit: 04/13/17 Next Visit: 07/13/17  Okay to refill per Dr. Deveshwar  

## 2017-05-10 DIAGNOSIS — M329 Systemic lupus erythematosus, unspecified: Secondary | ICD-10-CM | POA: Diagnosis not present

## 2017-05-10 DIAGNOSIS — N390 Urinary tract infection, site not specified: Secondary | ICD-10-CM | POA: Diagnosis not present

## 2017-05-15 ENCOUNTER — Other Ambulatory Visit: Payer: Self-pay | Admitting: Rheumatology

## 2017-05-16 ENCOUNTER — Other Ambulatory Visit (HOSPITAL_COMMUNITY): Payer: Self-pay | Admitting: Nephrology

## 2017-05-16 ENCOUNTER — Telehealth: Payer: Self-pay | Admitting: Rheumatology

## 2017-05-16 DIAGNOSIS — M329 Systemic lupus erythematosus, unspecified: Secondary | ICD-10-CM

## 2017-05-16 NOTE — Telephone Encounter (Signed)
[facility] left [facility] and other injection. Please call patient to advise.

## 2017-05-16 NOTE — Telephone Encounter (Signed)
Last Visit: 04/13/17 Next Visit: 07/13/17 Labs: 02/17/17 Her serologies is stable. She has hypocomplementemia  Left message for patient to advise she is due for labs after start to [facility].   Okay to refill MTX?

## 2017-05-16 NOTE — Telephone Encounter (Signed)
ok 

## 2017-05-17 ENCOUNTER — Other Ambulatory Visit: Payer: Self-pay | Admitting: Nephrology

## 2017-05-17 DIAGNOSIS — L930 Discoid lupus erythematosus: Secondary | ICD-10-CM

## 2017-05-17 DIAGNOSIS — M329 Systemic lupus erythematosus, unspecified: Secondary | ICD-10-CM

## 2017-05-17 NOTE — Telephone Encounter (Signed)
Patient advised that the prescription for [facility]lysta [facility] not [facility] it[facility] filled through [facility]. Patient provided with number to pharmacy to set up delivery of medication.

## 2017-05-23 ENCOUNTER — Other Ambulatory Visit: Payer: Self-pay | Admitting: Rheumatology

## 2017-05-23 NOTE — Telephone Encounter (Signed)
Left message to advise patient before refill we will need labs.

## 2017-05-24 ENCOUNTER — Other Ambulatory Visit: Payer: Self-pay | Admitting: Radiology

## 2017-05-24 DIAGNOSIS — Z79899 Other long term (current) drug therapy: Secondary | ICD-10-CM

## 2017-05-24 DIAGNOSIS — M3219 Other organ or system involvement in systemic lupus erythematosus: Secondary | ICD-10-CM | POA: Diagnosis not present

## 2017-05-25 LAB — URINALYSIS, ROUTINE W REFLEX MICROSCOPIC
BILIRUBIN URINE: NEGATIVE
Glucose, UA: NEGATIVE
KETONES UR: NEGATIVE
NITRITE: NEGATIVE
PH: 6.5 (ref 5.0–8.0)
Specific Gravity, Urine: 1.021 (ref 1.001–1.03)
Squamous Epithelial / LPF: >=28 /HPF — AB (ref <=5)

## 2017-05-25 LAB — CBC WITH DIFFERENTIAL/PLATELET
BASOS ABS: 22 {cells}/uL (ref 0–200)
BASOS PCT: 0.4 %
Eosinophils Absolute: 90 cells/uL (ref 15–500)
Eosinophils Relative: 1.6 %
HEMATOCRIT: 40.2 % (ref 35.0–45.0)
HEMOGLOBIN: 13.1 g/dL (ref 11.7–15.5)
Lymphs Abs: 1142 cells/uL (ref 850–3900)
MCH: 25.9 pg — ABNORMAL LOW (ref 27.0–33.0)
MCHC: 32.6 g/dL (ref 32.0–36.0)
MCV: 79.4 fL — AB (ref 80.0–100.0)
Monocytes Relative: 7.5 %
NEUTROS ABS: 3926 {cells}/uL (ref 1500–7800)
Neutrophils Relative %: 70.1 %
Platelets: 271 10*3/uL (ref 140–400)
RBC: 5.06 10*6/uL (ref 3.80–5.10)
RDW: 15.6 % — ABNORMAL HIGH (ref 11.0–15.0)
Total Lymphocyte: 20.4 %
WBC: 5.6 10*3/uL (ref 3.8–10.8)
WBCMIX: 420 {cells}/uL (ref 200–950)

## 2017-05-25 LAB — COMPLETE METABOLIC PANEL WITH GFR
AG RATIO: 1.3 (calc) (ref 1.0–2.5)
ALBUMIN MSPROF: 4 g/dL (ref 3.6–5.1)
ALT: 18 U/L (ref 6–29)
AST: 20 U/L (ref 10–30)
Alkaline phosphatase (APISO): 73 U/L (ref 33–115)
BUN: 7 mg/dL (ref 7–25)
CHLORIDE: 107 mmol/L (ref 98–110)
CO2: 26 mmol/L (ref 20–32)
Calcium: 8.9 mg/dL (ref 8.6–10.2)
Creat: 0.54 mg/dL (ref 0.50–1.10)
GFR, EST AFRICAN AMERICAN: 154 mL/min/{1.73_m2} (ref >=60)
GFR, Est Non African American: 133 mL/min/{1.73_m2} (ref >=60)
GLOBULIN: 3.1 g/dL (ref 1.9–3.7)
Glucose, Bld: 88 mg/dL (ref 65–99)
Potassium: 3.6 mmol/L (ref 3.5–5.3)
Sodium: 140 mmol/L (ref 135–146)
Total Bilirubin: 0.4 mg/dL (ref 0.2–1.2)
Total Protein: 7.1 g/dL (ref 6.1–8.1)

## 2017-05-25 NOTE — Progress Notes (Signed)
Notify Pt. And forward labs to her nephrologist

## 2017-05-30 ENCOUNTER — Other Ambulatory Visit: Payer: Self-pay | Admitting: Rheumatology

## 2017-05-30 NOTE — Telephone Encounter (Signed)
Last Visit: 04/13/17 Next Visit: 07/13/17  Okay to refill per Dr. Estanislado Pandy

## 2017-06-29 ENCOUNTER — Telehealth: Payer: Self-pay

## 2017-06-29 NOTE — Telephone Encounter (Signed)
Received patients Benlysta Rx from [facility]. Medication was placed in refrigerator. Medication should have gone to patients home.  Called patient update. Patient states that she will pick up the medication tomorrow (06/30/17) around 9am. Told patient to call [facility] to update her address so that her refills will come to her home. Patient voices understanding and denies any questions at this time.   Petr Bontempo, [place], CPhT 10:16 AM

## 2017-07-02 NOTE — Progress Notes (Deleted)
Office Visit Note  Patient: [name] [name]             Date of Birth: 1995-11-03           MRN: [id number]             PCP: Patient, No Pcp Per Referring: No ref. provider found Visit Date: 07/13/2017 Occupation: @GUAROCC @    Subjective:  No chief complaint on file.   History of Present Illness: [name] [name] is a 21 y.o. female ***   Activities of Daily Living:  Patient reports morning stiffness for *** {minute/hour:19697}.   Patient {ACTIONS;DENIES/REPORTS:21021675::"Denies"} nocturnal pain.  Difficulty dressing/grooming: {ACTIONS;DENIES/REPORTS:21021675::"Denies"} Difficulty climbing stairs: {ACTIONS;DENIES/REPORTS:21021675::"Denies"} Difficulty getting out of chair: {ACTIONS;DENIES/REPORTS:21021675::"Denies"} Difficulty using hands for taps, buttons, cutlery, and/or writing: {ACTIONS;DENIES/REPORTS:21021675::"Denies"}   No Rheumatology ROS completed.   PMFS History:  Patient Active Problem List   Diagnosis Date Noted  . Depression 02/08/2017  . Primary osteoarthritis of both knees 11/09/2016  . Chronic midline low back pain without sciatica 11/09/2016  . Vitamin D deficiency 11/09/2016  . SLE (systemic lupus erythematosus) ([place]) 09/19/2016  . High risk medication use 09/19/2016  . Insomnia 01/29/2016  . Depo-Provera contraceptive status 08/11/2015  . Raynaud's disease 08/11/2015  . Seasonal allergies 08/11/2015  . IDA (iron deficiency anemia) 08/11/2015    Past Medical History:  Diagnosis Date  . Lupus   . Raynaud disease     Family History  Problem Relation Age of Onset  . Aneurysm Mother   . Stroke Sister    Past Surgical History:  Procedure Laterality Date  . FRACTURE SURGERY    . TONSILLECTOMY     Social History   Social History Narrative  . No narrative on file     Objective: Vital Signs: There were no vitals taken for this visit.   Physical Exam   Musculoskeletal Exam: ***  CDAI Exam: No CDAI exam completed.    Investigation: No  additional findings. CBC Latest Ref Rng & Units 05/24/2017 02/17/2017 12/28/2016  WBC 3.8 - 10.8 Thousand/uL 5.6 6.8 6.4  Hemoglobin 11.7 - 15.5 g/dL 13.1 12.6 12.8  Hematocrit 35.0 - 45.0 % 40.2 38.9 40.3  Platelets 140 - 400 Thousand/uL 271 328 382   CMP Latest Ref Rng & Units 05/24/2017 02/17/2017 12/28/2016  Glucose 65 - 99 mg/dL 88 75 81  BUN 7 - 25 mg/dL 7 7 9   Creatinine 0.50 - 1.10 mg/dL 0.54 0.56 0.68  Sodium 135 - 146 mmol/L 140 140 140  Potassium 3.5 - 5.3 mmol/L 3.6 3.8 3.6  Chloride 98 - 110 mmol/L 107 107 106  CO2 20 - 32 mmol/L 26 19(L) 25  Calcium 8.6 - 10.2 mg/dL 8.9 8.8 9.0  Total Protein 6.1 - 8.1 g/dL 7.1 6.8 7.2  Total Bilirubin 0.2 - 1.2 mg/dL 0.4 0.4 0.4  Alkaline Phos 33 - 115 U/L - 81 81  AST 10 - 30 U/L 20 19 19   ALT 6 - 29 U/L 18 22 22     Imaging: No results found.  Speciality Comments: No specialty comments available.    Procedures:  No procedures performed Allergies: Patient has no known allergies.   Assessment / Plan:     Visit Diagnoses: No diagnosis found.    Orders: No orders of the defined types were placed in this encounter.  No orders of the defined types were placed in this encounter.   Face-to-face time spent with patient was *** minutes. 50% of time was spent in counseling and coordination  of care.  Follow-Up Instructions: No Follow-up on file.   Earnestine Mealing, NT  Note - This record has been created using Editor, commissioning.  Chart creation errors have been sought, but may not always  have been located. Such creation errors do not reflect on  the standard of medical care.

## 2017-07-04 ENCOUNTER — Telehealth: Payer: Self-pay

## 2017-07-04 NOTE — Telephone Encounter (Signed)
Patient would like to know if she could get a note stating that she needs to be moved to another department due to having Raynaud's disease.  States that she has to go into a freezer and hands get frostbite and it stays very cold in the area and her supervisor is complaining.  CB# is [phone].  Please advise.  Thank You.

## 2017-07-04 NOTE — Telephone Encounter (Signed)
Ok to give a note.

## 2017-07-05 ENCOUNTER — Encounter: Payer: Self-pay | Admitting: *Deleted

## 2017-07-05 ENCOUNTER — Encounter: Payer: Self-pay | Admitting: Radiology

## 2017-07-05 NOTE — Telephone Encounter (Signed)
Letter written for patient.

## 2017-07-13 ENCOUNTER — Ambulatory Visit: Payer: BLUE CROSS/BLUE SHIELD | Admitting: Rheumatology

## 2017-07-24 NOTE — Progress Notes (Deleted)
Office Visit Note  Patient: [name] [name]             Date of Birth: 1996-08-22           MRN: [id number]             PCP: Patient, No Pcp Per Referring: No ref. provider found Visit Date: 08/07/2017 Occupation: '@GUAROCC' @    Subjective:  No chief complaint on file.   History of Present Illness: [name] [name] is a 21 y.o. female ***   Activities of Daily Living:  Patient reports morning stiffness for *** {minute/hour:19697}.   Patient {ACTIONS;DENIES/REPORTS:21021675::"Denies"} nocturnal pain.  Difficulty dressing/grooming: {ACTIONS;DENIES/REPORTS:21021675::"Denies"} Difficulty climbing stairs: {ACTIONS;DENIES/REPORTS:21021675::"Denies"} Difficulty getting out of chair: {ACTIONS;DENIES/REPORTS:21021675::"Denies"} Difficulty using hands for taps, buttons, cutlery, and/or writing: {ACTIONS;DENIES/REPORTS:21021675::"Denies"}   No Rheumatology ROS completed.   PMFS History:  Patient Active Problem List   Diagnosis Date Noted  . Depression 02/08/2017  . Primary osteoarthritis of both knees 11/09/2016  . Chronic midline low back pain without sciatica 11/09/2016  . Vitamin D deficiency 11/09/2016  . SLE (systemic lupus erythematosus) ([place]) 09/19/2016  . High risk medication use 09/19/2016  . Insomnia 01/29/2016  . Depo-Provera contraceptive status 08/11/2015  . Raynaud's disease 08/11/2015  . Seasonal allergies 08/11/2015  . IDA (iron deficiency anemia) 08/11/2015    Past Medical History:  Diagnosis Date  . Lupus   . Raynaud disease     Family History  Problem Relation Age of Onset  . Aneurysm Mother   . Stroke Sister    Past Surgical History:  Procedure Laterality Date  . FRACTURE SURGERY    . TONSILLECTOMY     Social History   Social History Narrative  . Not on file     Objective: Vital Signs: There were no vitals taken for this visit.   Physical Exam   Musculoskeletal Exam: ***  CDAI Exam: No CDAI exam completed.    Investigation: No additional  findings. CBC Latest Ref Rng & Units 05/24/2017 02/17/2017 12/28/2016  WBC 3.8 - 10.8 Thousand/uL 5.6 6.8 6.4  Hemoglobin 11.7 - 15.5 g/dL 13.1 12.6 12.8  Hematocrit 35.0 - 45.0 % 40.2 38.9 40.3  Platelets 140 - 400 Thousand/uL 271 328 382   CMP     Component Value Date/Time   NA 140 05/24/2017 1234   K 3.6 05/24/2017 1234   CL 107 05/24/2017 1234   CO2 26 05/24/2017 1234   GLUCOSE 88 05/24/2017 1234   BUN 7 05/24/2017 1234   CREATININE 0.54 05/24/2017 1234   CALCIUM 8.9 05/24/2017 1234   PROT 7.1 05/24/2017 1234   ALBUMIN 3.7 02/17/2017 1201   AST 20 05/24/2017 1234   ALT 18 05/24/2017 1234   ALKPHOS 81 02/17/2017 1201   BILITOT 0.4 05/24/2017 1234   GFRNONAA 133 05/24/2017 1234   GFRAA 154 05/24/2017 1234  05/24/2017 UA 2+ protein 02/17/2017 C4 low at 8 ESR 17 dsDNA 13 vitamin D low at 20 01/29/2016 dsDNA 31, vitamin D 14 Imaging: No results found.  Speciality Comments: No specialty comments available.    Procedures:  No procedures performed Allergies: Patient has no known allergies.   Assessment / Plan:     Visit Diagnoses:  systemic lupus erythematosus  - Positive ANA, double-stranded DNA, Ro, RNP, RF, history of nasal ulcers, fatigue, anemia, arthritis, Raynauds.  High risk medication use - Methotrexate 1 mL subcutaneous every week, folic acid 2 mg by mouth daily, Benlysta 200 mg SQ  qwk (03/21/2017), prednisone 5 mg qd( PLQ -rash)  Primary osteoarthritis of both knees  Vitamin D deficiency  Iron deficiency anemia, unspecified iron deficiency anemia type  Depo-Provera contraceptive status  Other depression  Primary insomnia    Orders: No orders of the defined types were placed in this encounter.  No orders of the defined types were placed in this encounter.   Face-to-face time spent with patient was *** minutes. 50% of time was spent in counseling and coordination of care.  Follow-Up Instructions: No Follow-up on file.   Bo Merino,  MD  Note - This record has been created using Editor, commissioning.  Chart creation errors have been sought, but may not always  have been located. Such creation errors do not reflect on  the standard of medical care.

## 2017-08-07 ENCOUNTER — Ambulatory Visit: Payer: BLUE CROSS/BLUE SHIELD | Admitting: Rheumatology

## 2017-08-16 ENCOUNTER — Other Ambulatory Visit: Payer: Self-pay | Admitting: *Deleted

## 2017-08-16 NOTE — Telephone Encounter (Signed)
Refill request received via fax  Last Visit: 04/13/17 Next Visit was due November 2018 Patient cancelled 07/13/17 and no showed 08/07/17 Labs: 05/24/17 stable TB Gold: negative (04/27/16)  Other systemic lupus erythematosus with other organ involvement  Raynaud's disease without gangrene  Okay to refill Belysta?

## 2017-08-16 NOTE — Telephone Encounter (Signed)
Pl sch appt

## 2017-08-16 NOTE — Telephone Encounter (Signed)
Patient has been scheduled for 08/21/17. Will refill when patient comes to office.

## 2017-08-21 ENCOUNTER — Ambulatory Visit: Payer: Self-pay | Admitting: Rheumatology

## 2017-08-24 NOTE — Progress Notes (Signed)
Office Visit Note  Patient: [name] [name]             Date of Birth: Sep 20, 1995           MRN: [id number]             PCP: Patient, No Pcp Per Referring: No ref. provider found Visit Date: 08/28/2017 Occupation: @GUAROCC @    Subjective:  Other (increased swelling and fatigue )   History of Present Illness: [name] [name] is a 21 y.o. female with history of systemic lupus erythematosus. She's been on Benlysta since July 2018. She's been off prednisone for the last 3 months. She states that she has not noticed much improvement on Benlysta and methotrexate combination. She has missed only one dose of Benlysta per patient. She's been taking methotrexate on regular basis. She complains of pain and swelling in her bilateral hands and bilateral feet. She's been also having discomfort in her knee joints. She denies any nasal ulcers.   Activities of Daily Living:  Patient reports morning stiffness for 45 minutes.   Patient Reports nocturnal pain. Bilateral feet Difficulty dressing/grooming: Denies Difficulty climbing stairs: Reports Difficulty getting out of chair: Reports Difficulty using hands for taps, buttons, cutlery, and/or writing: Reports   Review of Systems  Constitutional: Positive for fatigue and weight gain. Negative for night sweats, weight loss and weakness.  HENT: Positive for mouth dryness. Negative for mouth sores, trouble swallowing, trouble swallowing and nose dryness.   Eyes: Negative for pain, redness, visual disturbance and dryness.  Respiratory: Negative for cough, shortness of breath and difficulty breathing.   Cardiovascular: Negative for chest pain, palpitations, hypertension, irregular heartbeat and swelling in legs/feet.  Gastrointestinal: Negative for blood in stool, constipation and diarrhea.  Endocrine: Negative for increased urination.  Genitourinary: Negative for vaginal dryness.  Musculoskeletal: Positive for arthralgias, joint pain, joint swelling,  myalgias, morning stiffness and myalgias. Negative for muscle weakness and muscle tenderness.  Skin: Positive for color change, rash and hair loss. Negative for skin tightness, ulcers and sensitivity to sunlight.  Allergic/Immunologic: Negative for susceptible to infections.  Neurological: Negative for dizziness, memory loss and night sweats.  Hematological: Positive for swollen glands.  Psychiatric/Behavioral: Positive for sleep disturbance. The patient is nervous/anxious.     PMFS History:  Patient Active Problem List   Diagnosis Date Noted  . Depression 02/08/2017  . Primary osteoarthritis of both knees 11/09/2016  . Chronic midline low back pain without sciatica 11/09/2016  . Vitamin D deficiency 11/09/2016  . SLE (systemic lupus erythematosus) ([place]) 09/19/2016  . High risk medication use 09/19/2016  . Insomnia 01/29/2016  . Depo-Provera contraceptive status 08/11/2015  . Raynaud's disease 08/11/2015  . Seasonal allergies 08/11/2015  . IDA (iron deficiency anemia) 08/11/2015    Past Medical History:  Diagnosis Date  . Lupus   . Raynaud disease     Family History  Problem Relation Age of Onset  . Aneurysm Mother   . Stroke Sister    Past Surgical History:  Procedure Laterality Date  . FRACTURE SURGERY    . TONSILLECTOMY     Social History   Social History Narrative  . Not on file     Objective: Vital Signs: BP 134/89 (BP Location: Left Arm, Patient Position: Sitting, Cuff Size: Normal)   Pulse (!) 108   Resp 17   Ht 5\' 3"  (1.6 m)   Wt 162 lb (73.5 kg)   BMI 28.70 kg/m    Physical Exam  Constitutional: She is  oriented to person, place, and time. She appears well-developed and well-nourished.  HENT:  Head: Normocephalic and atraumatic.  Eyes: Conjunctivae and EOM are normal.  Neck: Normal range of motion.  Cardiovascular: Normal rate, regular rhythm, normal heart sounds and intact distal pulses.  Pulmonary/Chest: Effort normal and breath sounds normal.    Abdominal: Soft. Bowel sounds are normal.  Lymphadenopathy:    She has no cervical adenopathy.  Neurological: She is alert and oriented to person, place, and time.  Skin: Skin is warm and dry. Capillary refill takes less than 2 seconds.  Psychiatric: She has a normal mood and affect. Her behavior is normal.  Nursing note and vitals reviewed.    Musculoskeletal Exam: C-spine and thoracic lumbar spine good range of motion. Shoulder joints are good range of motion. She has right elbow joint contracture. Some tenderness on palpation of her right elbow. She has no synovitis over her wrist joints MCPs or PIP joints. She has some warmth and swelling in her right knee joint. Left knee joint has some warmth but no swelling. All the joints are good range of motion with no inflammation.  CDAI Exam: CDAI Homunculus Exam:   Tenderness:  RLE: tibiofemoral LLE: tibiofemoral  Swelling:  RLE: tibiofemoral  Joint Counts:  CDAI Tender Joint count: 2 CDAI Swollen Joint count: 1  Global Assessments:  Patient Global Assessment: 8 Provider Global Assessment: 5  CDAI Calculated Score: 16    Investigation: No additional findings. CBC Latest Ref Rng & Units 05/24/2017 02/17/2017 12/28/2016  WBC 3.8 - 10.8 Thousand/uL 5.6 6.8 6.4  Hemoglobin 11.7 - 15.5 g/dL 13.1 12.6 12.8  Hematocrit 35.0 - 45.0 % 40.2 38.9 40.3  Platelets 140 - 400 Thousand/uL 271 328 382   CMP Latest Ref Rng & Units 05/24/2017 02/17/2017 12/28/2016  Glucose 65 - 99 mg/dL 88 75 81  BUN 7 - 25 mg/dL 7 7 9   Creatinine 0.50 - 1.10 mg/dL 0.54 0.56 0.68  Sodium 135 - 146 mmol/L 140 140 140  Potassium 3.5 - 5.3 mmol/L 3.6 3.8 3.6  Chloride 98 - 110 mmol/L 107 107 106  CO2 20 - 32 mmol/L 26 19(L) 25  Calcium 8.6 - 10.2 mg/dL 8.9 8.8 9.0  Total Protein 6.1 - 8.1 g/dL 7.1 6.8 7.2  Total Bilirubin 0.2 - 1.2 mg/dL 0.4 0.4 0.4  Alkaline Phos 33 - 115 U/L - 81 81  AST 10 - 30 U/L 20 19 19   ALT 6 - 29 U/L 18 22 22     Imaging: No results  found.  Speciality Comments: No specialty comments available.    Procedures:  Large Joint Inj on 08/28/2017 12:58 PM Indications: pain Details: 27 G 1.5 in needle, medial approach  Arthrogram: No  Medications: 1.5 mL lidocaine 1 %; 40 mg triamcinolone acetonide 40 MG/ML Outcome: tolerated well, no immediate complications Procedure, treatment alternatives, risks and benefits explained, specific risks discussed. Consent was given by the patient. Immediately prior to procedure a time out was called to verify the correct patient, procedure, equipment, support staff and site/side marked as required. Patient was prepped and draped in the usual sterile fashion.     Allergies: Patient has no known allergies.   Assessment / Plan:     Visit Diagnoses: Other systemic lupus erythematosus with other organ involvement ([facility]) - Positive ANA, double-stranded DNA, Ro, RNP, RF, history of nasal ulcers, fatigue, anemia, arthritis, Raynauds. - Patient continues to have a lot of pain and discomfort in her joints. She's been having discomfort in  her bilateral hands, bilateral elbows, bilateral knees and her bilateral feet and ankles. She is right elbow joint contracture which is unchanged. She is warmth in her right knee joint with discomfort. She's been on Benlysta and methotrexate combination for 5 months now. She was unable to tolerate Plaquenil in the past. None of the medications have been effective so far for her. I will obtain following labs today. He also had detailed discussion regarding referral to [facility]. I'll make that referral for her. Plan: Urinalysis, Routine w reflex microscopic, Sedimentation rate, Anti-DNA antibody, double-stranded, C3 and C4, ANA, Ambulatory referral to Rheumatology  High risk medication use - Methotrexate 1 mL subcutaneous every week, folic acid 2 mg by mouth daily, Benlysta 200 mg subcutaneous every week (03/21/2017) (PLQ-rash) - Plan: CBC with  Differential/Platelet, COMPLETE METABOLIC PANEL WITH GFR today and every 3 months to monitor for drug toxicity.  Raynaud's disease without gangrene: She continues to have active disease. Warm clothing was discussed.  Primary osteoarthritis of both knees - Plan: Large Joint Inj: R knee she is warmth and swelling in her right knee joint. After informed consent was obtained right knee joint was prepped and injected with cortisone. Patient does not want to take any oral prednisone.  DDD (degenerative disc disease), lumbar - facet joint arthropathy: Chronic pain   Other medical problems are listed as follows:  Primary insomnia  Depo-Provera contraceptive status  History of anemia  History of vitamin D deficiency - Plan: VITAMIN D 25 Hydroxy (Vit-D Deficiency, Fractures)  History of depression    Orders: Orders Placed This Encounter  Procedures  . Large Joint Inj: R knee  . CBC with Differential/Platelet  . COMPLETE METABOLIC PANEL WITH GFR  . Urinalysis, Routine w reflex microscopic  . Sedimentation rate  . Anti-DNA antibody, double-stranded  . C3 and C4  . ANA  . VITAMIN D 25 Hydroxy (Vit-D Deficiency, Fractures)  . Ambulatory referral to Rheumatology   No orders of the defined types were placed in this encounter.   Face-to-face time spent with patient was 30 minutes.Greater than 50% of time was spent in counseling and coordination of care.  Follow-Up Instructions: Return in about 3 months (around 11/26/2017) for Systemic lupus, Osteoarthritis.   Bo Merino, MD  Note - This record has been created using Editor, commissioning.  Chart creation errors have been sought, but may not always  have been located. Such creation errors do not reflect on  the standard of medical care.

## 2017-08-28 ENCOUNTER — Encounter: Payer: Self-pay | Admitting: Rheumatology

## 2017-08-28 ENCOUNTER — Ambulatory Visit: Payer: BLUE CROSS/BLUE SHIELD | Admitting: Rheumatology

## 2017-08-28 VITALS — BP 134/89 | HR 108 | Resp 17 | Ht 63.0 in | Wt 162.0 lb

## 2017-08-28 DIAGNOSIS — M25561 Pain in right knee: Secondary | ICD-10-CM | POA: Diagnosis not present

## 2017-08-28 DIAGNOSIS — M3219 Other organ or system involvement in systemic lupus erythematosus: Secondary | ICD-10-CM | POA: Diagnosis not present

## 2017-08-28 DIAGNOSIS — M170 Bilateral primary osteoarthritis of knee: Secondary | ICD-10-CM

## 2017-08-28 DIAGNOSIS — G8929 Other chronic pain: Secondary | ICD-10-CM

## 2017-08-28 DIAGNOSIS — M5136 Other intervertebral disc degeneration, lumbar region: Secondary | ICD-10-CM

## 2017-08-28 DIAGNOSIS — Z8639 Personal history of other endocrine, nutritional and metabolic disease: Secondary | ICD-10-CM

## 2017-08-28 DIAGNOSIS — Z862 Personal history of diseases of the blood and blood-forming organs and certain disorders involving the immune mechanism: Secondary | ICD-10-CM

## 2017-08-28 DIAGNOSIS — Z3042 Encounter for surveillance of injectable contraceptive: Secondary | ICD-10-CM | POA: Diagnosis not present

## 2017-08-28 DIAGNOSIS — Z8659 Personal history of other mental and behavioral disorders: Secondary | ICD-10-CM | POA: Diagnosis not present

## 2017-08-28 DIAGNOSIS — Z79899 Other long term (current) drug therapy: Secondary | ICD-10-CM | POA: Diagnosis not present

## 2017-08-28 DIAGNOSIS — I7300 Raynaud's syndrome without gangrene: Secondary | ICD-10-CM | POA: Diagnosis not present

## 2017-08-28 DIAGNOSIS — F5101 Primary insomnia: Secondary | ICD-10-CM

## 2017-08-28 MED ORDER — LIDOCAINE HCL 1 % IJ SOLN
1.5000 mL | INTRAMUSCULAR | Status: AC | PRN
  Administered 2017-08-28: 1.5 mL

## 2017-08-28 MED ORDER — TRIAMCINOLONE ACETONIDE 40 MG/ML IJ SUSP
40.0000 mg | INTRAMUSCULAR | Status: AC | PRN
  Administered 2017-08-28: 40 mg via INTRA_ARTICULAR

## 2017-08-28 NOTE — Progress Notes (Deleted)
   Procedure Note  Patient: [name] [name]             Date of Birth: [birth year]           MRN: [id number]             Visit Date: 08/28/2017  Procedures: Visit Diagnoses: Other systemic lupus erythematosus with other organ involvement ([place]) - Positive ANA, double-stranded DNA, Ro, RNP, RF, history of nasal ulcers, fatigue, anemia, arthritis, Raynauds.  High risk medication use - Methotrexate 1 mL subcutaneous every week, folic acid 2 mg by mouth daily, Benlysta 200 mg subcutaneous every week (03/21/2017) (PLQ-rash)  Raynaud's disease without gangrene  Primary osteoarthritis of both knees  DDD (degenerative disc disease), lumbar - facet joint arthropathy  Primary insomnia  Depo-Provera contraceptive status  History of anemia  History of vitamin D deficiency  History of depression  Large Joint Inj: R knee on 08/28/2017 12:49 PM Indications: pain Details: 27 G 1.5 in needle, anterior approach Medications: 1.5 mL lidocaine 1 %; 40 mg triamcinolone acetonide 40 MG/ML

## 2017-08-28 NOTE — Patient Instructions (Signed)
Standing Labs We placed an order today for your standing lab work.    Please come back and get your standing labs in March and every 3 months  We have open lab Monday through Friday from 8:30-11:30 AM and 1:30-4 PM at the office of Dr. Sekai Gitlin.   The office is located at [address], [address], [place], [place] [address] No appointment is necessary.   Labs are drawn by [facility].  You may receive a bill from [facility] for your lab work. If you have any questions regarding directions or hours of operation,  please call [phone].    

## 2017-08-29 ENCOUNTER — Telehealth: Payer: Self-pay | Admitting: *Deleted

## 2017-08-29 LAB — COMPLETE METABOLIC PANEL WITH GFR
AG Ratio: 1 (calc) (ref 1.0–2.5)
ALKALINE PHOSPHATASE (APISO): 75 U/L (ref 33–115)
ALT: 28 U/L (ref 6–29)
AST: 25 U/L (ref 10–30)
Albumin: 3.8 g/dL (ref 3.6–5.1)
BUN: 8 mg/dL (ref 7–25)
CALCIUM: 9 mg/dL (ref 8.6–10.2)
CO2: 25 mmol/L (ref 20–32)
CREATININE: 0.5 mg/dL (ref 0.50–1.10)
Chloride: 105 mmol/L (ref 98–110)
GFR, EST NON AFRICAN AMERICAN: 136 mL/min/{1.73_m2} (ref >=60)
GFR, Est African American: 158 mL/min/{1.73_m2} (ref >=60)
GLUCOSE: 85 mg/dL (ref 65–99)
Globulin: 3.8 g/dL (calc) — ABNORMAL HIGH (ref 1.9–3.7)
Potassium: 3.7 mmol/L (ref 3.5–5.3)
Sodium: 138 mmol/L (ref 135–146)
Total Bilirubin: 0.5 mg/dL (ref 0.2–1.2)
Total Protein: 7.6 g/dL (ref 6.1–8.1)

## 2017-08-29 LAB — CBC WITH DIFFERENTIAL/PLATELET
BASOS PCT: 0.4 %
Basophils Absolute: 20 cells/uL (ref 0–200)
EOS ABS: 50 {cells}/uL (ref 15–500)
Eosinophils Relative: 1 %
HEMATOCRIT: 40.2 % (ref 35.0–45.0)
Hemoglobin: 13.2 g/dL (ref 11.7–15.5)
LYMPHS ABS: 1320 {cells}/uL (ref 850–3900)
MCH: 26.6 pg — AB (ref 27.0–33.0)
MCHC: 32.8 g/dL (ref 32.0–36.0)
MCV: 80.9 fL (ref 80.0–100.0)
MPV: 13.7 fL — AB (ref 7.5–12.5)
Monocytes Relative: 6.6 %
NEUTROS PCT: 65.6 %
Neutro Abs: 3280 cells/uL (ref 1500–7800)
Platelets: 231 10*3/uL (ref 140–400)
RBC: 4.97 10*6/uL (ref 3.80–5.10)
RDW: 13.8 % (ref 11.0–15.0)
Total Lymphocyte: 26.4 %
WBC: 5 10*3/uL (ref 3.8–10.8)
WBCMIX: 330 {cells}/uL (ref 200–950)

## 2017-08-29 LAB — URINALYSIS, ROUTINE W REFLEX MICROSCOPIC
BILIRUBIN URINE: NEGATIVE
GLUCOSE, UA: NEGATIVE
Hyaline Cast: NONE SEEN /LPF
KETONES UR: NEGATIVE
LEUKOCYTES UA: NEGATIVE
NITRITE: NEGATIVE
Specific Gravity, Urine: 1.025 (ref 1.001–1.03)
pH: 6 (ref 5.0–8.0)

## 2017-08-29 LAB — ANA: Anti Nuclear Antibody(ANA): POSITIVE — AB

## 2017-08-29 LAB — C3 AND C4
C3 Complement: 70 mg/dL — ABNORMAL LOW (ref 83–193)
C4 Complement: 7 mg/dL — ABNORMAL LOW (ref 15–57)

## 2017-08-29 LAB — ANTI-NUCLEAR AB-TITER (ANA TITER)

## 2017-08-29 LAB — SEDIMENTATION RATE: Sed Rate: 29 mm/h — ABNORMAL HIGH (ref 0–20)

## 2017-08-29 LAB — VITAMIN D 25 HYDROXY (VIT D DEFICIENCY, FRACTURES): Vit D, 25-Hydroxy: 16 ng/mL — ABNORMAL LOW (ref 30–100)

## 2017-08-29 LAB — ANTI-DNA ANTIBODY, DOUBLE-STRANDED: DS DNA AB: 46 [IU]/mL — AB

## 2017-08-29 MED ORDER — VITAMIN D (ERGOCALCIFEROL) 1.25 MG (50000 UNIT) PO CAPS
50000.0000 [IU] | ORAL_CAPSULE | ORAL | 0 refills | Status: DC
Start: 2017-08-31 — End: 2017-12-19

## 2017-08-29 MED ORDER — PREDNISONE 5 MG PO TABS: ORAL_TABLET | ORAL | 0 refills | Status: DC

## 2017-08-29 NOTE — Telephone Encounter (Signed)
-----   Message from Bo Merino, MD sent at 08/29/2017 12:51 PM EST ----- Patient having a flare. Plan to start patient on Prednisone 10 mg and taper by 2.5 mg every 2 weeks.Vitamin D 50000 units twice weekly for 12 weeks. Repeat Vitamin D in 3 months. Advise her to keep [facility] appointment.

## 2017-08-29 NOTE — Progress Notes (Signed)
The increase in double-stranded DNA and drop and complements definitely indicate a flare. I would recommend starting at prednisone 20 mg by mouth daily and then decreasing by 2.5 mg every week.

## 2017-08-29 NOTE — Progress Notes (Signed)
Patient having a flare. Plan to start patient on Prednisone 10 mg and taper by 2.5 mg every 2 weeks.Vitamin D 50000 units twice weekly for 12 weeks. Repeat Vitamin D in 3 months. Advise her to keep [facility] appointment.

## 2017-10-04 DIAGNOSIS — G4700 Insomnia, unspecified: Secondary | ICD-10-CM | POA: Diagnosis not present

## 2017-10-04 DIAGNOSIS — R030 Elevated blood-pressure reading, without diagnosis of hypertension: Secondary | ICD-10-CM | POA: Diagnosis not present

## 2017-10-04 DIAGNOSIS — I10 Essential (primary) hypertension: Secondary | ICD-10-CM | POA: Diagnosis not present

## 2017-10-04 DIAGNOSIS — M329 Systemic lupus erythematosus, unspecified: Secondary | ICD-10-CM | POA: Diagnosis not present

## 2017-10-05 DIAGNOSIS — H109 Unspecified conjunctivitis: Secondary | ICD-10-CM | POA: Diagnosis not present

## 2017-10-08 ENCOUNTER — Emergency Department (HOSPITAL_COMMUNITY)
Admission: EM | Admit: 2017-10-08 | Discharge: 2017-10-09 | Disposition: A | Discharge status: Home / Self Care | Payer: BLUE CROSS/BLUE SHIELD | Attending: Emergency Medicine | Admitting: Emergency Medicine

## 2017-10-08 DIAGNOSIS — L930 Discoid lupus erythematosus: Secondary | ICD-10-CM | POA: Diagnosis not present

## 2017-10-08 DIAGNOSIS — R05 Cough: Secondary | ICD-10-CM | POA: Insufficient documentation

## 2017-10-08 DIAGNOSIS — M3210 Systemic lupus erythematosus, organ or system involvement unspecified: Secondary | ICD-10-CM | POA: Insufficient documentation

## 2017-10-08 DIAGNOSIS — J029 Acute pharyngitis, unspecified: Secondary | ICD-10-CM | POA: Diagnosis not present

## 2017-10-08 DIAGNOSIS — M79602 Pain in left arm: Secondary | ICD-10-CM | POA: Diagnosis not present

## 2017-10-08 DIAGNOSIS — J111 Influenza due to unidentified influenza virus with other respiratory manifestations: Secondary | ICD-10-CM | POA: Diagnosis not present

## 2017-10-08 DIAGNOSIS — Z79899 Other long term (current) drug therapy: Secondary | ICD-10-CM | POA: Insufficient documentation

## 2017-10-08 DIAGNOSIS — M25512 Pain in left shoulder: Secondary | ICD-10-CM | POA: Diagnosis not present

## 2017-10-08 DIAGNOSIS — R112 Nausea with vomiting, unspecified: Secondary | ICD-10-CM | POA: Insufficient documentation

## 2017-10-08 DIAGNOSIS — M79601 Pain in right arm: Secondary | ICD-10-CM | POA: Diagnosis present

## 2017-10-08 DIAGNOSIS — M25511 Pain in right shoulder: Secondary | ICD-10-CM | POA: Diagnosis not present

## 2017-10-08 DIAGNOSIS — R6889 Other general symptoms and signs: Secondary | ICD-10-CM

## 2017-10-08 NOTE — ED Triage Notes (Signed)
Pt states she suspects she may be having a lupus flare up due to the systemic joint swelling she is experiencing. Adds that she might even have undiagnosed Sjgren's syndromes as she is having dry mouth, red eyes, throat swelling and feeling unwell. Chart review collaborates an extensive rheumatological hx with pt stating she recently found out she cannot take prednisone due to decreased renal function also had an anaphylactic attack to plaquenil. She has an appt scheduled [facility] specialist.

## 2017-10-09 ENCOUNTER — Encounter (HOSPITAL_COMMUNITY): Payer: Self-pay | Admitting: Nurse Practitioner

## 2017-10-09 ENCOUNTER — Emergency Department (HOSPITAL_COMMUNITY): Payer: BLUE CROSS/BLUE SHIELD

## 2017-10-09 DIAGNOSIS — R05 Cough: Secondary | ICD-10-CM | POA: Diagnosis not present

## 2017-10-09 LAB — CBC WITH DIFFERENTIAL/PLATELET
BASOS ABS: 0 10*3/uL (ref 0.0–0.1)
Basophils Relative: 0 %
EOS ABS: 0.1 10*3/uL (ref 0.0–0.7)
Eosinophils Relative: 1 %
HCT: 39.4 % (ref 36.0–46.0)
HEMOGLOBIN: 13.4 g/dL (ref 12.0–15.0)
LYMPHS ABS: 1 10*3/uL (ref 0.7–4.0)
Lymphocytes Relative: 19 %
MCH: 27.3 pg (ref 26.0–34.0)
MCHC: 34 g/dL (ref 30.0–36.0)
MCV: 80.2 fL (ref 78.0–100.0)
MONO ABS: 0.4 10*3/uL (ref 0.1–1.0)
Monocytes Relative: 7 %
NEUTROS ABS: 3.6 10*3/uL (ref 1.7–7.7)
Neutrophils Relative %: 73 %
PLATELETS: 217 10*3/uL (ref 150–400)
RBC: 4.91 MIL/uL (ref 3.87–5.11)
RDW: 13.8 % (ref 11.5–15.5)
WBC: 5.1 10*3/uL (ref 4.0–10.5)

## 2017-10-09 LAB — COMPREHENSIVE METABOLIC PANEL
ALBUMIN: 3.5 g/dL (ref 3.5–5.0)
ALK PHOS: 65 U/L (ref 38–126)
ALT: 67 U/L — ABNORMAL HIGH (ref 14–54)
ANION GAP: 10 (ref 5–15)
AST: 77 U/L — ABNORMAL HIGH (ref 15–41)
BUN: 8 mg/dL (ref 6–20)
CALCIUM: 9.2 mg/dL (ref 8.9–10.3)
CO2: 24 mmol/L (ref 22–32)
Chloride: 99 mmol/L — ABNORMAL LOW (ref 101–111)
Creatinine, Ser: 0.44 mg/dL (ref 0.44–1.00)
GFR calc non Af Amer: >60 mL/min (ref >60)
Glucose, Bld: 91 mg/dL (ref 65–99)
POTASSIUM: 4.1 mmol/L (ref 3.5–5.1)
SODIUM: 133 mmol/L — AB (ref 135–145)
Total Bilirubin: 0.6 mg/dL (ref 0.3–1.2)
Total Protein: 8.2 g/dL — ABNORMAL HIGH (ref 6.5–8.1)

## 2017-10-09 MED ORDER — OSELTAMIVIR PHOSPHATE 75 MG PO CAPS: 75.0000 mg | ORAL_CAPSULE | Freq: Two times a day (BID) | ORAL | 0 refills | Status: DC

## 2017-10-09 MED ORDER — HYDROMORPHONE HCL 1 MG/ML IJ SOLN
1.0000 mg | Freq: Once | INTRAMUSCULAR | Status: AC
  Administered 2017-10-09: 1 mg via INTRAVENOUS
  Filled 2017-10-09: qty 1

## 2017-10-09 MED ORDER — SODIUM CHLORIDE 0.9 % IV BOLUS (SEPSIS)
1000.0000 mL | Freq: Once | INTRAVENOUS | Status: AC
  Administered 2017-10-09: 1000 mL via INTRAVENOUS

## 2017-10-09 MED ORDER — PREDNISONE 20 MG PO TABS: ORAL_TABLET | ORAL | 0 refills | Status: DC

## 2017-10-09 MED ORDER — ONDANSETRON HCL 4 MG/2ML IJ SOLN
4.0000 mg | Freq: Once | INTRAMUSCULAR | Status: AC
  Administered 2017-10-09: 4 mg via INTRAVENOUS
  Filled 2017-10-09: qty 2

## 2017-10-09 MED ORDER — METHYLPREDNISOLONE SODIUM SUCC 125 MG IJ SOLR
125.0000 mg | Freq: Once | INTRAMUSCULAR | Status: AC
  Administered 2017-10-09: 125 mg via INTRAVENOUS
  Filled 2017-10-09: qty 2

## 2017-10-09 MED ORDER — OSELTAMIVIR PHOSPHATE 75 MG PO CAPS
75.0000 mg | ORAL_CAPSULE | Freq: Once | ORAL | Status: AC
  Administered 2017-10-09: 75 mg via ORAL
  Filled 2017-10-09: qty 1

## 2017-10-09 NOTE — ED Provider Notes (Signed)
[facility] DEPT Provider Note   CSN: [id number] Arrival date & time: 10/08/17  2207     History   Chief Complaint Chief Complaint  Patient presents with  . Joint Swelling  . SLE Flare Up    HPI [name] [name] is a 22 y.o. female.  Patient presents to the ER for evaluation of diffuse joint pain.  Patient reports that she has a history of lupus with similar pains.  She sees rheumatology and has been taking her medications.  She is not currently on steroids.  Patient complaining of pain in both of her hands, wrists, shoulders.  In addition, patient has had cough with sputum production, sore throat, nausea, vomiting.      Past Medical History:  Diagnosis Date  . Lupus   . Raynaud disease     Patient Active Problem List   Diagnosis Date Noted  . Depression 02/08/2017  . Primary osteoarthritis of both knees 11/09/2016  . Chronic midline low back pain without sciatica 11/09/2016  . Vitamin D deficiency 11/09/2016  . SLE (systemic lupus erythematosus) ([place]) 09/19/2016  . High risk medication use 09/19/2016  . Insomnia 01/29/2016  . Depo-Provera contraceptive status 08/11/2015  . Raynaud's disease 08/11/2015  . Seasonal allergies 08/11/2015  . IDA (iron deficiency anemia) 08/11/2015    Past Surgical History:  Procedure Laterality Date  . FRACTURE SURGERY    . TONSILLECTOMY      OB History    No data available       Home Medications    Prior to Admission medications   Medication Sig Start Date End Date Taking? Authorizing Provider  baclofen (LIORESAL) 10 MG tablet TAKE 1 TABLET BY MOUTH THREE TIMES A DAY 05/30/17   Deveshwar, Abel Presto, MD  Belimumab (BENLYSTA) 200 MG/ML SOAJ Inject 200 mg into the skin once a week. 04/13/17   Bo Merino, MD  [facility] MELATONIN EXTRA STRENGTH 5 MG TABS TAKE 1 TABLET BY MOUTH AT BEDTIME Patient not taking: Reported on 08/28/2017 01/31/17   Boykin Nearing, MD  escitalopram (LEXAPRO) 10 MG tablet TAKE 1  TABLET (10 MG TOTAL) BY MOUTH DAILY. 02/28/17   Bo Merino, MD  ferrous sulfate 325 (65 FE) MG tablet Take 1 tablet (325 mg total) by mouth 2 (two) times daily. With 1/2 glass of orange juice 12/15/15   Davonna Belling, MD  folic acid (FOLVITE) 1 MG tablet Take 2 tablets (2 mg total) by mouth daily. 11/09/16   Bo Merino, MD  methotrexate 50 MG/2ML injection INJECT 1 ML (25 MG TOTAL) INTO THE SKIN ONCE A WEEK. 05/16/17   Bo Merino, MD  mupirocin cream (BACTROBAN) 2 % Apply 1 application topically 2 (two) times daily. Patient not taking: Reported on 08/28/2017 02/07/17   Charlesetta Shanks, MD  oseltamivir (TAMIFLU) 75 MG capsule Take 1 capsule (75 mg total) by mouth every 12 (twelve) hours. 10/09/17   Orpah Greek, MD  predniSONE (DELTASONE) 20 MG tablet 3 tabs po daily x 3 days, then 2 tabs x 3 days, then 1.5 tabs x 3 days, then 1 tab x 3 days, then 0.5 tabs x 3 days 10/09/17   Orpah Greek, MD  traZODone (DESYREL) 50 MG tablet Take 50 mg by mouth at bedtime. 02/14/17   [provider]  triamcinolone (KENALOG) 0.025 % ointment Apply 1 application topically 2 (two) times daily. Apply to the affected area, do not apply to face. Patient taking differently: Apply 1 application topically 2 (two) times daily as  needed. Apply to the affected area, do not apply to face. 06/12/16   Harris, Vernie Shanks, PA-C  Tuberculin-Allergy Syringes 27G X 1/2" 1 ML KIT Inject 1 Syringe into the skin once a week. To be used with weekly methotrexate injections. 11/09/16   Bo Merino, MD  Vitamin D, Ergocalciferol, (DRISDOL) 50000 units CAPS capsule Take 1 capsule (50,000 Units total) by mouth 2 (two) times a week. 08/31/17   Bo Merino, MD  cetirizine (ZYRTEC) 10 MG tablet Take 1 tablet (10 mg total) by mouth daily as needed for allergies. One tab daily for allergies Patient not taking: Reported on 12/15/2015 08/11/15 12/25/15  Boykin Nearing, MD  fluticasone (FLONASE) 50 MCG/ACT  nasal spray Place 2 sprays into both nostrils daily. Patient not taking: Reported on 12/15/2015 08/11/15 12/25/15  Boykin Nearing, MD    Family History Family History  Problem Relation Age of Onset  . Aneurysm Mother   . Stroke Sister     Social History Social History   Tobacco Use  . Smoking status: Never Smoker  . Smokeless tobacco: Never Used  Substance Use Topics  . Alcohol use: No  . Drug use: No     Allergies   Patient has no known allergies.   Review of Systems Review of Systems  Constitutional: Negative for fever.  HENT: Positive for sore throat.   Respiratory: Positive for cough.   Musculoskeletal: Positive for arthralgias and joint swelling.  All other systems reviewed and are negative.    Physical Exam Updated Vital Signs BP (!) 132/98 (BP Location: Left Arm)   Pulse (!) 123   Temp 98.8 F (37.1 C) (Oral)   Resp 20   SpO2 98%   Physical Exam  Constitutional: She is oriented to person, place, and time. She appears well-developed and well-nourished. No distress.  HENT:  Head: Normocephalic and atraumatic.  Right Ear: Hearing normal.  Left Ear: Hearing normal.  Nose: Nose normal.  Mouth/Throat: Oropharynx is clear and moist and mucous membranes are normal.  Eyes: Conjunctivae and EOM are normal. Pupils are equal, round, and reactive to light.  Neck: Normal range of motion. Neck supple.  Cardiovascular: Regular rhythm, S1 normal and S2 normal. Exam reveals no gallop and no friction rub.  No murmur heard. Pulmonary/Chest: Effort normal and breath sounds normal. No respiratory distress. She exhibits no tenderness.  Abdominal: Soft. Normal appearance and bowel sounds are normal. There is no hepatosplenomegaly. There is no tenderness. There is no rebound, no guarding, no tenderness at McBurney's point and negative Murphy's sign. No hernia.  Musculoskeletal: Normal range of motion.  Diffuse, symmetric tenderness of joints of upper extremities with slight  swelling, no erythema or warmth  Neurological: She is alert and oriented to person, place, and time. She has normal strength. No cranial nerve deficit or sensory deficit. Coordination normal. GCS eye subscore is 4. GCS verbal subscore is 5. GCS motor subscore is 6.  Skin: Skin is warm, dry and intact. No rash noted. No cyanosis.  Psychiatric: She has a normal mood and affect. Her speech is normal and behavior is normal. Thought content normal.  Nursing note and vitals reviewed.    ED Treatments / Results  Labs (all labs ordered are listed, but only abnormal results are displayed) Labs Reviewed  COMPREHENSIVE METABOLIC PANEL - Abnormal; Notable for the following components:      Result Value   Sodium 133 (*)    Chloride 99 (*)    Total Protein 8.2 (*)    AST  77 (*)    ALT 67 (*)    All other components within normal limits  CBC WITH DIFFERENTIAL/PLATELET    EKG  EKG Interpretation None       Radiology Dg Chest 2 View  Result Date: 10/09/2017 CLINICAL DATA:  Cough. Lupus flare up. Dry mouth, red eyes, throat swelling, and feeling unwell. EXAM: CHEST  2 VIEW COMPARISON:  05/11/2016 FINDINGS: Shallow inspiration. Normal heart size and pulmonary vascularity. No focal airspace disease or consolidation in the lungs. No blunting of costophrenic angles. No pneumothorax. Mediastinal contours appear intact. IMPRESSION: No active cardiopulmonary disease. Electronically Signed   By: Lucienne Capers M.D.   On: 10/09/2017 01:24    Procedures Procedures (including critical care time)  Medications Ordered in ED Medications  oseltamivir (TAMIFLU) capsule 75 mg (not administered)  sodium chloride 0.9 % bolus 1,000 mL (1,000 mLs Intravenous New Bag/Given 10/09/17 0102)  ondansetron (ZOFRAN) injection 4 mg (4 mg Intravenous Given 10/09/17 0107)  HYDROmorphone (DILAUDID) injection 1 mg (1 mg Intravenous Given 10/09/17 0107)  methylPREDNISolone sodium succinate (SOLU-MEDROL) 125 mg/2 mL injection  125 mg (125 mg Intravenous Given 10/09/17 0107)     Initial Impression / Assessment and Plan / ED Course  I have reviewed the triage vital signs and the nursing notes.  Pertinent labs & imaging results that were available during my care of the patient were reviewed by me and considered in my medical decision making (see chart for details).     Presents with diffuse joint pain that she has experienced in the past with lupus flares.  In addition to this, however, she has flulike symptoms as well.  Patient hydrated and provided analgesia.  She is feeling much improved.  Chest x-ray does not show any signs of pneumonia.  Final Clinical Impressions(s) / ED Diagnoses   Final diagnoses:  Lupus erythematosus, unspecified form  Flu-like symptoms    ED Discharge Orders        Ordered    predniSONE (DELTASONE) 20 MG tablet     10/09/17 0216    oseltamivir (TAMIFLU) 75 MG capsule  Every 12 hours     10/09/17 0216       Orpah Greek, MD 10/09/17 [phone]

## 2017-10-10 ENCOUNTER — Telehealth: Payer: Self-pay | Admitting: *Deleted

## 2017-10-10 NOTE — Telephone Encounter (Signed)
Patient states she was swelling pretty bad and her throat was swelling. Patient states she is feeling a little better. Patient states she is not going to take the prednisone.

## 2017-10-12 ENCOUNTER — Telehealth: Payer: Self-pay | Admitting: Rheumatology

## 2017-10-12 NOTE — Telephone Encounter (Signed)
Patient has changed her mind, and wants an rx for [facility] [address]

## 2017-10-12 NOTE — Telephone Encounter (Signed)
Patient has decided she would like to take the prednisone. Patient advise a prescription was sent to the pharmacy on 08/29/17 that she never picked up. Patient advised to contact the pharmacy as they should still have it on file. Patient will contact the office if she has any trouble getting the prescription.

## 2017-10-25 DIAGNOSIS — M329 Systemic lupus erythematosus, unspecified: Secondary | ICD-10-CM | POA: Diagnosis not present

## 2017-10-25 DIAGNOSIS — I10 Essential (primary) hypertension: Secondary | ICD-10-CM | POA: Diagnosis not present

## 2017-11-06 ENCOUNTER — Other Ambulatory Visit: Payer: Self-pay | Admitting: Rheumatology

## 2017-11-22 NOTE — Progress Notes (Deleted)
Office Visit Note  Patient: [name] [name]             Date of Birth: [birth year]           MRN: [id number]             PCP: Patient, No Pcp Per Referring: No ref. provider found Visit Date: 12/06/2017 Occupation: @GUAROCC @    Subjective:  No chief complaint on file.   History of Present Illness: [name] [name] is a 22 y.o. female ***   Activities of Daily Living:  Patient reports morning stiffness for *** {minute/hour:19697}.   Patient {ACTIONS;DENIES/REPORTS:21021675::"Denies"} nocturnal pain.  Difficulty dressing/grooming: {ACTIONS;DENIES/REPORTS:21021675::"Denies"} Difficulty climbing stairs: {ACTIONS;DENIES/REPORTS:21021675::"Denies"} Difficulty getting out of chair: {ACTIONS;DENIES/REPORTS:21021675::"Denies"} Difficulty using hands for taps, buttons, cutlery, and/or writing: {ACTIONS;DENIES/REPORTS:21021675::"Denies"}   No Rheumatology ROS completed.   PMFS History:  Patient Active Problem List   Diagnosis Date Noted  . Depression 02/08/2017  . Primary osteoarthritis of both knees 11/09/2016  . Chronic midline low back pain without sciatica 11/09/2016  . Vitamin D deficiency 11/09/2016  . SLE (systemic lupus erythematosus) ([place]) 09/19/2016  . High risk medication use 09/19/2016  . Insomnia 01/29/2016  . Depo-Provera contraceptive status 08/11/2015  . Raynaud's disease 08/11/2015  . Seasonal allergies 08/11/2015  . IDA (iron deficiency anemia) 08/11/2015    Past Medical History:  Diagnosis Date  . Lupus   . Raynaud disease     Family History  Problem Relation Age of Onset  . Aneurysm Mother   . Stroke Sister    Past Surgical History:  Procedure Laterality Date  . FRACTURE SURGERY    . TONSILLECTOMY     Social History   Social History Narrative  . Not on file     Objective: Vital Signs: There were no vitals taken for this visit.   Physical Exam   Musculoskeletal Exam: ***  CDAI Exam: No CDAI exam completed.    Investigation: No additional  findings. CBC Latest Ref Rng & Units 10/09/2017 08/28/2017 05/24/2017  WBC 4.0 - 10.5 K/uL 5.1 5.0 5.6  Hemoglobin 12.0 - 15.0 g/dL 13.4 13.2 13.1  Hematocrit 36.0 - 46.0 % 39.4 40.2 40.2  Platelets 150 - 400 K/uL 217 231 271   CMP Latest Ref Rng & Units 10/09/2017 08/28/2017 05/24/2017  Glucose 65 - 99 mg/dL 91 85 88  BUN 6 - 20 mg/dL 8 8 7   Creatinine 0.44 - 1.00 mg/dL 0.44 0.50 0.54  Sodium 135 - 145 mmol/L 133(L) 138 140  Potassium 3.5 - 5.1 mmol/L 4.1 3.7 3.6  Chloride 101 - 111 mmol/L 99(L) 105 107  CO2 22 - 32 mmol/L 24 25 26   Calcium 8.9 - 10.3 mg/dL 9.2 9.0 8.9  Total Protein 6.5 - 8.1 g/dL 8.2(H) 7.6 7.1  Total Bilirubin 0.3 - 1.2 mg/dL 0.6 0.5 0.4  Alkaline Phos 38 - 126 U/L 65 - -  AST 15 - 41 U/L 77(H) 25 20  ALT 14 - 54 U/L 67(H) 28 18    Imaging: No results found.  Speciality Comments: No specialty comments available.    Procedures:  No procedures performed Allergies: Patient has no known allergies.   Assessment / Plan:     Visit Diagnoses: No diagnosis found.    Orders: No orders of the defined types were placed in this encounter.  No orders of the defined types were placed in this encounter.   Face-to-face time spent with patient was *** minutes. 50% of time was spent in counseling and coordination of  care.  Follow-Up Instructions: No Follow-up on file.   Earnestine Mealing, CMA  Note - This record has been created using Editor, commissioning.  Chart creation errors have been sought, but may not always  have been located. Such creation errors do not reflect on  the standard of medical care.

## 2017-12-06 ENCOUNTER — Ambulatory Visit: Payer: BLUE CROSS/BLUE SHIELD | Admitting: Rheumatology

## 2017-12-10 ENCOUNTER — Other Ambulatory Visit: Payer: Self-pay | Admitting: Rheumatology

## 2017-12-11 ENCOUNTER — Encounter: Payer: Self-pay | Admitting: Rheumatology

## 2017-12-11 ENCOUNTER — Ambulatory Visit: Payer: BLUE CROSS/BLUE SHIELD | Admitting: Rheumatology

## 2017-12-11 VITALS — BP 122/78 | HR 109 | Resp 17 | Ht 62.0 in | Wt 162.0 lb

## 2017-12-11 DIAGNOSIS — Z8659 Personal history of other mental and behavioral disorders: Secondary | ICD-10-CM | POA: Diagnosis not present

## 2017-12-11 DIAGNOSIS — M170 Bilateral primary osteoarthritis of knee: Secondary | ICD-10-CM

## 2017-12-11 DIAGNOSIS — M5136 Other intervertebral disc degeneration, lumbar region: Secondary | ICD-10-CM | POA: Diagnosis not present

## 2017-12-11 DIAGNOSIS — I7300 Raynaud's syndrome without gangrene: Secondary | ICD-10-CM | POA: Diagnosis not present

## 2017-12-11 DIAGNOSIS — Z862 Personal history of diseases of the blood and blood-forming organs and certain disorders involving the immune mechanism: Secondary | ICD-10-CM

## 2017-12-11 DIAGNOSIS — F5101 Primary insomnia: Secondary | ICD-10-CM | POA: Diagnosis not present

## 2017-12-11 DIAGNOSIS — Z8639 Personal history of other endocrine, nutritional and metabolic disease: Secondary | ICD-10-CM | POA: Diagnosis not present

## 2017-12-11 DIAGNOSIS — Z79899 Other long term (current) drug therapy: Secondary | ICD-10-CM

## 2017-12-11 DIAGNOSIS — Z3042 Encounter for surveillance of injectable contraceptive: Secondary | ICD-10-CM | POA: Diagnosis not present

## 2017-12-11 DIAGNOSIS — M3219 Other organ or system involvement in systemic lupus erythematosus: Secondary | ICD-10-CM | POA: Diagnosis not present

## 2017-12-11 MED ORDER — PREDNISONE 5 MG PO TABS: 5.0000 mg | ORAL_TABLET | Freq: Every day | ORAL | 0 refills | Status: DC

## 2017-12-11 NOTE — Progress Notes (Signed)
Office Visit Note  Patient: [name] [name]             Date of Birth: [birth year]           MRN: [id number]             PCP: [name] Lei, MD Referring: No ref. provider found Visit Date: 12/11/2017 Occupation: @GUAROCC @    Subjective:  Fatigue   History of Present Illness: [name] [name] is a 22 y.o. female with history of systemic lupus erythematosus, osteoarthritis, and DDD.  Patient states that she has been off of her methotrexate and Benlysta for about 3-4 months.  She states that she was unsure if she was supposed to resume her medications.  She states that she was seen in the emergency department about 2 months ago and was started on prednisone which helped resolved her symptoms of a flare.  She states that currently she is having increased fatigue and insomnia.  She states she has been taking Benadryl at night to help her sleep.  She states she is also having muscle tenderness and pain in her bilateral feet.  She states she has noticed some swelling in her toes as well.  She states she has sores in her nose but no sores in her mouth.  She denies any swollen lymph nodes.  She denies any rashes or hair loss.  States she continues to have Raynaud's in her fingers.  She denies any digital ulcerations.  She denies any recent fevers or chills.  She states that her bilateral knees have been feeling better.  She states she continues to have lower back pain and stiffness.  She states that she has been taking vitamin D 50,000 units twice a week.   Activities of Daily Living:  Patient reports morning stiffness for 20 minutes.   Patient Denies nocturnal pain.  Difficulty dressing/grooming: Denies Difficulty climbing stairs: Reports Difficulty getting out of chair: Denies Difficulty using hands for taps, buttons, cutlery, and/or writing: Denies   Review of Systems  Constitutional: Positive for fatigue.  HENT: Positive for mouth dryness and nose dryness. Negative for mouth sores.   Eyes:  Negative for pain, visual disturbance and dryness.  Respiratory: Negative for cough, hemoptysis, shortness of breath and difficulty breathing.   Cardiovascular: Negative for chest pain, palpitations, hypertension and swelling in legs/feet.  Gastrointestinal: Negative for blood in stool, constipation and diarrhea.  Endocrine: Negative for increased urination.  Genitourinary: Negative for painful urination.  Musculoskeletal: Positive for arthralgias, joint pain, joint swelling, morning stiffness and muscle tenderness. Negative for myalgias, muscle weakness and myalgias.  Skin: Positive for color change and hair loss. Negative for pallor, rash, nodules/bumps, skin tightness, ulcers and sensitivity to sunlight.  Allergic/Immunologic: Negative for susceptible to infections.  Neurological: Negative for dizziness, numbness, headaches and weakness.  Hematological: Negative for swollen glands.  Psychiatric/Behavioral: Positive for sleep disturbance. Negative for depressed mood. The patient is not nervous/anxious.     PMFS History:  Patient Active Problem List   Diagnosis Date Noted  . Depression 02/08/2017  . Primary osteoarthritis of both knees 11/09/2016  . Chronic midline low back pain without sciatica 11/09/2016  . Vitamin D deficiency 11/09/2016  . SLE (systemic lupus erythematosus) ([place]) 09/19/2016  . High risk medication use 09/19/2016  . Insomnia 01/29/2016  . Depo-Provera contraceptive status 08/11/2015  . Raynaud's disease 08/11/2015  . Seasonal allergies 08/11/2015  . IDA (iron deficiency anemia) 08/11/2015    Past Medical History:  Diagnosis Date  . Lupus (  [place])   . Raynaud disease     Family History  Problem Relation Age of Onset  . Aneurysm Mother   . Stroke Sister   . Seizures Sister    Past Surgical History:  Procedure Laterality Date  . FRACTURE SURGERY    . TONSILLECTOMY     Social History   Social History Narrative  . Not on file     Objective: Vital  Signs: BP 122/78 (BP Location: Right Arm, Patient Position: Sitting, Cuff Size: Normal)   Pulse (!) 109   Resp 17   Ht 5\' 2"  (1.575 m)   Wt 162 lb (73.5 kg)   BMI 29.63 kg/m    Physical Exam  Constitutional: She is oriented to person, place, and time. She appears well-developed and well-nourished.  HENT:  Head: Normocephalic and atraumatic.  Nose ulcers present.  No parotid swelling.  Eyes: Conjunctivae and EOM are normal.  Neck: Normal range of motion.  Cardiovascular: Normal rate, regular rhythm, normal heart sounds and intact distal pulses.  Pulmonary/Chest: Effort normal and breath sounds normal.  Abdominal: Soft. Bowel sounds are normal.  Lymphadenopathy:    She has no cervical adenopathy.  Neurological: She is alert and oriented to person, place, and time.  Skin: Skin is warm and dry. Capillary refill takes less than 2 seconds.  No malar rash noted.  No digital ulcerations.  Psychiatric: She has a normal mood and affect. Her behavior is normal.  Nursing note and vitals reviewed.    Musculoskeletal Exam: C-spine, thoracic spine, lumbar spine good range of motion.  No midline spinal tenderness.  No SI joint tenderness.  Shoulder joints good range of motion with no discomfort.  She has mild flexion contractures of bilateral elbows.  Wrist joints, MCPs, PIPs, DIPs good range of motion with no synovitis.  No digital ulcers noted.  Hip joints, knee joints, ankle joints, MTPs, PIPs, and DIPs good ROM with no synovitis.  No warmth or effusion of bilateral knees.  She has PIP and DIP synovial thickening consistent with osteoarthritis of the feet.   CDAI Exam: No CDAI exam completed.    Investigation: No additional findings. CBC Latest Ref Rng & Units 10/09/2017 08/28/2017 05/24/2017  WBC 4.0 - 10.5 K/uL 5.1 5.0 5.6  Hemoglobin 12.0 - 15.0 g/dL 13.4 13.2 13.1  Hematocrit 36.0 - 46.0 % 39.4 40.2 40.2  Platelets 150 - 400 K/uL 217 231 271   CMP Latest Ref Rng & Units 10/09/2017  08/28/2017 05/24/2017  Glucose 65 - 99 mg/dL 91 85 88  BUN 6 - 20 mg/dL 8 8 7   Creatinine 0.44 - 1.00 mg/dL 0.44 0.50 0.54  Sodium 135 - 145 mmol/L 133(L) 138 140  Potassium 3.5 - 5.1 mmol/L 4.1 3.7 3.6  Chloride 101 - 111 mmol/L 99(L) 105 107  CO2 22 - 32 mmol/L 24 25 26   Calcium 8.9 - 10.3 mg/dL 9.2 9.0 8.9  Total Protein 6.5 - 8.1 g/dL 8.2(H) 7.6 7.1  Total Bilirubin 0.3 - 1.2 mg/dL 0.6 0.5 0.4  Alkaline Phos 38 - 126 U/L 65 - -  AST 15 - 41 U/L 77(H) 25 20  ALT 14 - 54 U/L 67(H) 28 18    Imaging: No results found.  Speciality Comments: No specialty comments available.    Procedures:  No procedures performed Allergies: Hydroxychloroquine and Latex   Assessment / Plan:     Visit Diagnoses: Other systemic lupus erythematosus with other organ involvement (HCC) - Positive ANA, double-stranded DNA, Ro, RNP, RF,  history of nasal ulcers, fatigue, anemia, arthritis, Raynauds. -Patient states that she keeps on flaring.  She stopped methotrexate and Benlysta she believes these medications do not work at all.  She states the only medication works for her is his prednisone.  She only wants prednisone 5 mg p.o. daily and does not want to take any other medications.  We had detailed discussion about other treatment options including Imuran but she declined.  I will obtain following labs today.  I also discussed with her that I will refer her to [facility] for transfer of care as I would not be able to continue care only with prednisone.  Plan: Urinalysis, Routine w reflex microscopic, C3 and C4, Sedimentation rate, Anti-DNA antibody, double-stranded  High risk medication use - Methotrexate 1 mL subcutaneous every week, folic acid 2 mg by mouth daily, Benlysta 200 mg subcutaneous every week.  Patient to stop methotrexate and Benlysta as she felt that they were not effective.  (03/21/2017) (PLQ-rash) - Plan: CBC with Differential/Platelet, COMPLETE METABOLIC PANEL WITH GFR  Raynaud's  disease without gangrene: She continues to have rainouts phenomenon.  Primary osteoarthritis of both knees: She has discomfort in her bilateral knee joints due to underlying osteoarthritis.  DDD (degenerative disc disease), lumbar - facet joint arthropathy: Lower back pain persist.  Primary insomnia: She has chronic insomnia.  History of vitamin D deficiency - Plan: VITAMIN D 25 Hydroxy (Vit-D Deficiency, Fractures).  She is not sure if she has been taking vitamin D.  She had vitamin D deficiency.  We will check vitamin D level again today.  History of anemia  History of depression  Depo-Provera contraceptive status    Orders: Orders Placed This Encounter  Procedures  . CBC with Differential/Platelet  . COMPLETE METABOLIC PANEL WITH GFR  . Urinalysis, Routine w reflex microscopic  . VITAMIN D 25 Hydroxy (Vit-D Deficiency, Fractures)  . C3 and C4  . Sedimentation rate  . Anti-DNA antibody, double-stranded   No orders of the defined types were placed in this encounter.   Face-to-face time spent with patient was 30 minutes. >50% of time was spent in counseling and coordination of care.  Follow-Up Instructions: Return if symptoms worsen or fail to improve, for Systemic lupus erythematosus.   Bo Merino, MD  Note - This record has been created using Editor, commissioning.  Chart creation errors have been sought, but may not always  have been located. Such creation errors do not reflect on  the standard of medical care.

## 2017-12-11 NOTE — Telephone Encounter (Signed)
Last Visit: 08/16/17 Next visit: 12/11/17  Okay to refill per Dr. Estanislado Pandy

## 2017-12-11 NOTE — Addendum Note (Signed)
Addended by: Francis Gaines C on: 12/11/2017 10:50 AM   Modules accepted: Orders

## 2017-12-12 ENCOUNTER — Other Ambulatory Visit (INDEPENDENT_AMBULATORY_CARE_PROVIDER_SITE_OTHER): Payer: Self-pay | Admitting: *Deleted

## 2017-12-12 NOTE — Progress Notes (Signed)
Please refer her to [facility] as soon as possible.  Call in vitamin D 50,000 units twice a week for 3 months.

## 2017-12-14 LAB — COMPLETE METABOLIC PANEL WITH GFR
AG Ratio: 1.1 (calc) (ref 1.0–2.5)
ALBUMIN MSPROF: 3.5 g/dL — AB (ref 3.6–5.1)
ALT: 16 U/L (ref 6–29)
AST: 28 U/L (ref 10–30)
Alkaline phosphatase (APISO): 53 U/L (ref 33–115)
BUN: 15 mg/dL (ref 7–25)
CO2: 28 mmol/L (ref 20–32)
CREATININE: 0.57 mg/dL (ref 0.50–1.10)
Calcium: 8.9 mg/dL (ref 8.6–10.2)
Chloride: 106 mmol/L (ref 98–110)
GFR, EST AFRICAN AMERICAN: 151 mL/min/{1.73_m2} (ref >=60)
GFR, EST NON AFRICAN AMERICAN: 131 mL/min/{1.73_m2} (ref >=60)
GLUCOSE: 81 mg/dL (ref 65–99)
Globulin: 3.3 g/dL (calc) (ref 1.9–3.7)
Potassium: 3.7 mmol/L (ref 3.5–5.3)
Sodium: 139 mmol/L (ref 135–146)
TOTAL PROTEIN: 6.8 g/dL (ref 6.1–8.1)
Total Bilirubin: 0.4 mg/dL (ref 0.2–1.2)

## 2017-12-14 LAB — CBC WITH DIFFERENTIAL/PLATELET
Basophils Absolute: 0 cells/uL (ref 0–200)
Basophils Relative: 0 %
EOS PCT: 1.7 %
Eosinophils Absolute: 51 cells/uL (ref 15–500)
HCT: 36.7 % (ref 35.0–45.0)
HEMOGLOBIN: 12.2 g/dL (ref 11.7–15.5)
LYMPHS ABS: 597 {cells}/uL — AB (ref 850–3900)
MCH: 27.7 pg (ref 27.0–33.0)
MCHC: 33.2 g/dL (ref 32.0–36.0)
MCV: 83.2 fL (ref 80.0–100.0)
MONOS PCT: 6.3 %
MPV: 14.1 fL — ABNORMAL HIGH (ref 7.5–12.5)
NEUTROS PCT: 72.1 %
Neutro Abs: 2163 cells/uL (ref 1500–7800)
PLATELETS: 240 10*3/uL (ref 140–400)
RBC: 4.41 10*6/uL (ref 3.80–5.10)
RDW: 15.5 % — AB (ref 11.0–15.0)
TOTAL LYMPHOCYTE: 19.9 %
WBC mixed population: 189 cells/uL — ABNORMAL LOW (ref 200–950)
WBC: 3 10*3/uL — AB (ref 3.8–10.8)

## 2017-12-14 LAB — URINALYSIS, ROUTINE W REFLEX MICROSCOPIC
BACTERIA UA: NONE SEEN /HPF
Bilirubin Urine: NEGATIVE
Glucose, UA: NEGATIVE
HGB URINE DIPSTICK: NEGATIVE
HYALINE CAST: NONE SEEN /LPF
Ketones, ur: NEGATIVE
Leukocytes, UA: NEGATIVE
Nitrite: NEGATIVE
RBC / HPF: NONE SEEN /HPF (ref 0–2)
Specific Gravity, Urine: 1.032 (ref 1.001–1.03)
pH: 6 (ref 5.0–8.0)

## 2017-12-14 LAB — C3 AND C4
C3 Complement: 55 mg/dL — ABNORMAL LOW (ref 83–193)
C4 COMPLEMENT: 5 mg/dL — AB (ref 15–57)

## 2017-12-14 LAB — ANTI-DNA ANTIBODY, DOUBLE-STRANDED: DS DNA AB: 294 [IU]/mL — AB

## 2017-12-14 LAB — VITAMIN D 25 HYDROXY (VIT D DEFICIENCY, FRACTURES): Vit D, 25-Hydroxy: 11 ng/mL — ABNORMAL LOW (ref 30–100)

## 2017-12-14 LAB — SEDIMENTATION RATE: Sed Rate: 14 mm/h (ref 0–20)

## 2017-12-15 ENCOUNTER — Other Ambulatory Visit (INDEPENDENT_AMBULATORY_CARE_PROVIDER_SITE_OTHER): Payer: Self-pay | Admitting: *Deleted

## 2017-12-18 NOTE — Progress Notes (Signed)
Patient's labs indicate active disease with hypocomplementemia and elevation of dsDNA.  Patient does not wish to take any medications at this time except for prednisone 5 mg p.o. daily which was given to her last visit.  She was referred to [facility].

## 2017-12-19 ENCOUNTER — Encounter: Payer: Self-pay | Admitting: *Deleted

## 2017-12-19 ENCOUNTER — Telehealth: Payer: Self-pay | Admitting: *Deleted

## 2017-12-19 MED ORDER — METHOCARBAMOL 500 MG PO TABS: 500.0000 mg | ORAL_TABLET | Freq: Three times a day (TID) | ORAL | 0 refills | Status: DC

## 2017-12-19 MED ORDER — VITAMIN D (ERGOCALCIFEROL) 1.25 MG (50000 UNIT) PO CAPS: 50000.0000 [IU] | ORAL_CAPSULE | ORAL | 0 refills | Status: AC

## 2017-12-19 NOTE — Telephone Encounter (Signed)
-----   Message from Shona Needles, RT sent at 12/12/2017  6:26 PM EDT ----- Appt at [facility] 12/27/17, patient is aware. Please see message below and call in Vit D. Per Dr. Estanislado Pandy, send discharge letter. Thank you.  ----- Message ----- From: Bo Merino, MD Sent: 12/12/2017   1:07 PM To: Shona Needles, RT, Pr-Rheumatology Clinical  Please refer her to [facility] as soon as possible.  Call in vitamin D 50,000 units twice a week for 3 months.

## 2017-12-27 DIAGNOSIS — M109 Gout, unspecified: Secondary | ICD-10-CM | POA: Diagnosis not present

## 2017-12-27 DIAGNOSIS — M329 Systemic lupus erythematosus, unspecified: Secondary | ICD-10-CM | POA: Diagnosis not present

## 2017-12-27 DIAGNOSIS — Z79899 Other long term (current) drug therapy: Secondary | ICD-10-CM | POA: Diagnosis not present

## 2018-01-08 ENCOUNTER — Encounter (HOSPITAL_COMMUNITY): Payer: Self-pay

## 2018-01-08 ENCOUNTER — Other Ambulatory Visit: Payer: Self-pay

## 2018-01-08 ENCOUNTER — Inpatient Hospital Stay (HOSPITAL_COMMUNITY)
Admission: EM | Admit: 2018-01-08 | Discharge: 2018-01-13 | DRG: 158 | Disposition: A | Discharge status: Home / Self Care | Payer: BLUE CROSS/BLUE SHIELD | Attending: Internal Medicine | Admitting: Internal Medicine

## 2018-01-08 DIAGNOSIS — B370 Candidal stomatitis: Secondary | ICD-10-CM | POA: Diagnosis present

## 2018-01-08 DIAGNOSIS — R300 Dysuria: Secondary | ICD-10-CM | POA: Diagnosis present

## 2018-01-08 DIAGNOSIS — K1231 Oral mucositis (ulcerative) due to antineoplastic therapy: Principal | ICD-10-CM | POA: Diagnosis present

## 2018-01-08 DIAGNOSIS — Z888 Allergy status to other drugs, medicaments and biological substances status: Secondary | ICD-10-CM

## 2018-01-08 DIAGNOSIS — Z91040 Latex allergy status: Secondary | ICD-10-CM

## 2018-01-08 DIAGNOSIS — Z7952 Long term (current) use of systemic steroids: Secondary | ICD-10-CM

## 2018-01-08 DIAGNOSIS — L97519 Non-pressure chronic ulcer of other part of right foot with unspecified severity: Secondary | ICD-10-CM | POA: Diagnosis present

## 2018-01-08 DIAGNOSIS — D72819 Decreased white blood cell count, unspecified: Secondary | ICD-10-CM | POA: Diagnosis present

## 2018-01-08 DIAGNOSIS — Z820 Family history of epilepsy and other diseases of the nervous system: Secondary | ICD-10-CM

## 2018-01-08 DIAGNOSIS — K121 Other forms of stomatitis: Secondary | ICD-10-CM

## 2018-01-08 DIAGNOSIS — I7300 Raynaud's syndrome without gangrene: Secondary | ICD-10-CM | POA: Diagnosis present

## 2018-01-08 DIAGNOSIS — T451X1A Poisoning by antineoplastic and immunosuppressive drugs, accidental (unintentional), initial encounter: Secondary | ICD-10-CM

## 2018-01-08 DIAGNOSIS — E876 Hypokalemia: Secondary | ICD-10-CM | POA: Diagnosis not present

## 2018-01-08 DIAGNOSIS — G4700 Insomnia, unspecified: Secondary | ICD-10-CM | POA: Diagnosis present

## 2018-01-08 DIAGNOSIS — Z8249 Family history of ischemic heart disease and other diseases of the circulatory system: Secondary | ICD-10-CM

## 2018-01-08 DIAGNOSIS — F32A Depression, unspecified: Secondary | ICD-10-CM | POA: Diagnosis present

## 2018-01-08 DIAGNOSIS — M170 Bilateral primary osteoarthritis of knee: Secondary | ICD-10-CM | POA: Diagnosis present

## 2018-01-08 DIAGNOSIS — Z9089 Acquired absence of other organs: Secondary | ICD-10-CM

## 2018-01-08 DIAGNOSIS — M329 Systemic lupus erythematosus, unspecified: Secondary | ICD-10-CM | POA: Diagnosis present

## 2018-01-08 DIAGNOSIS — B3731 Acute candidiasis of vulva and vagina: Secondary | ICD-10-CM | POA: Clinically undetermined

## 2018-01-08 DIAGNOSIS — G8929 Other chronic pain: Secondary | ICD-10-CM | POA: Diagnosis present

## 2018-01-08 DIAGNOSIS — K1230 Oral mucositis (ulcerative), unspecified: Secondary | ICD-10-CM | POA: Diagnosis present

## 2018-01-08 DIAGNOSIS — D509 Iron deficiency anemia, unspecified: Secondary | ICD-10-CM | POA: Diagnosis present

## 2018-01-08 DIAGNOSIS — F329 Major depressive disorder, single episode, unspecified: Secondary | ICD-10-CM | POA: Diagnosis present

## 2018-01-08 DIAGNOSIS — R319 Hematuria, unspecified: Secondary | ICD-10-CM | POA: Diagnosis present

## 2018-01-08 DIAGNOSIS — M545 Low back pain: Secondary | ICD-10-CM | POA: Diagnosis present

## 2018-01-08 DIAGNOSIS — T451X5A Adverse effect of antineoplastic and immunosuppressive drugs, initial encounter: Secondary | ICD-10-CM | POA: Diagnosis present

## 2018-01-08 DIAGNOSIS — B373 Candidiasis of vulva and vagina: Secondary | ICD-10-CM | POA: Diagnosis not present

## 2018-01-08 DIAGNOSIS — L52 Erythema nodosum: Secondary | ICD-10-CM | POA: Diagnosis present

## 2018-01-08 DIAGNOSIS — Z823 Family history of stroke: Secondary | ICD-10-CM

## 2018-01-08 HISTORY — DX: Poisoning by antineoplastic and immunosuppressive drugs, accidental (unintentional), initial encounter: T45.1X1A

## 2018-01-08 LAB — CBC WITH DIFFERENTIAL/PLATELET
BASOS ABS: 0 10*3/uL (ref 0.0–0.1)
BASOS PCT: 0 %
EOS ABS: 0 10*3/uL (ref 0.0–0.7)
Eosinophils Relative: 1 %
HCT: 34.7 % — ABNORMAL LOW (ref 36.0–46.0)
Hemoglobin: 11.2 g/dL — ABNORMAL LOW (ref 12.0–15.0)
Lymphocytes Relative: 28 %
Lymphs Abs: 0.3 10*3/uL — ABNORMAL LOW (ref 0.7–4.0)
MCH: 27.5 pg (ref 26.0–34.0)
MCHC: 32.3 g/dL (ref 30.0–36.0)
MCV: 85 fL (ref 78.0–100.0)
MONO ABS: 0 10*3/uL — AB (ref 0.1–1.0)
Monocytes Relative: 1 %
NEUTROS ABS: 0.9 10*3/uL — AB (ref 1.7–7.7)
NEUTROS PCT: 70 %
Platelets: 251 10*3/uL (ref 150–400)
RBC: 4.08 MIL/uL (ref 3.87–5.11)
RDW: 13.7 % (ref 11.5–15.5)
WBC: 1.2 10*3/uL — CL (ref 4.0–10.5)

## 2018-01-08 LAB — COMPREHENSIVE METABOLIC PANEL
ALBUMIN: 3.4 g/dL — AB (ref 3.5–5.0)
ALT: 88 U/L — ABNORMAL HIGH (ref 14–54)
ANION GAP: 12 (ref 5–15)
AST: 62 U/L — ABNORMAL HIGH (ref 15–41)
Alkaline Phosphatase: 60 U/L (ref 38–126)
BUN: 12 mg/dL (ref 6–20)
CHLORIDE: 105 mmol/L (ref 101–111)
CO2: 22 mmol/L (ref 22–32)
Calcium: 8.8 mg/dL — ABNORMAL LOW (ref 8.9–10.3)
Creatinine, Ser: 0.56 mg/dL (ref 0.44–1.00)
GFR calc non Af Amer: >60 mL/min (ref >60)
Glucose, Bld: 71 mg/dL (ref 65–99)
Potassium: 3.8 mmol/L (ref 3.5–5.1)
SODIUM: 139 mmol/L (ref 135–145)
Total Bilirubin: 0.9 mg/dL (ref 0.3–1.2)
Total Protein: 7.7 g/dL (ref 6.5–8.1)

## 2018-01-08 LAB — URINALYSIS, ROUTINE W REFLEX MICROSCOPIC
BILIRUBIN URINE: NEGATIVE
Glucose, UA: NEGATIVE mg/dL
Ketones, ur: 80 mg/dL — AB
NITRITE: NEGATIVE
PROTEIN: 100 mg/dL — AB
RBC / HPF: >50 RBC/hpf — ABNORMAL HIGH (ref 0–5)
SPECIFIC GRAVITY, URINE: 1.017 (ref 1.005–1.030)
pH: 6 (ref 5.0–8.0)

## 2018-01-08 LAB — I-STAT BETA HCG BLOOD, ED (MC, WL, AP ONLY): I-stat hCG, quantitative: <5 m[IU]/mL (ref <5)

## 2018-01-08 LAB — SEDIMENTATION RATE: Sed Rate: 47 mm/hr — ABNORMAL HIGH (ref 0–22)

## 2018-01-08 MED ORDER — GI COCKTAIL ~~LOC~~
30.0000 mL | Freq: Three times a day (TID) | ORAL | Status: DC | PRN
  Administered 2018-01-10 – 2018-01-11 (×2): 30 mL via ORAL
  Filled 2018-01-08 (×2): qty 30

## 2018-01-08 MED ORDER — SODIUM BICARBONATE 8.4 % IV SOLN
INTRAVENOUS | Status: AC
  Administered 2018-01-08: 22:00:00 via INTRAVENOUS
  Filled 2018-01-08: qty 150

## 2018-01-08 MED ORDER — MORPHINE SULFATE (PF) 4 MG/ML IV SOLN
4.0000 mg | Freq: Once | INTRAVENOUS | Status: AC
  Administered 2018-01-08: 4 mg via INTRAVENOUS
  Filled 2018-01-08: qty 1

## 2018-01-08 MED ORDER — MORPHINE SULFATE (PF) 2 MG/ML IV SOLN
2.0000 mg | INTRAVENOUS | Status: DC | PRN
  Administered 2018-01-09: 4 mg via INTRAVENOUS
  Administered 2018-01-10 – 2018-01-11 (×2): 2 mg via INTRAVENOUS
  Filled 2018-01-08 (×2): qty 1
  Filled 2018-01-08: qty 2

## 2018-01-08 MED ORDER — ACETAMINOPHEN 650 MG RE SUPP: 650.0000 mg | Freq: Four times a day (QID) | RECTAL | Status: DC | PRN

## 2018-01-08 MED ORDER — METHOCARBAMOL 500 MG PO TABS
500.0000 mg | ORAL_TABLET | Freq: Three times a day (TID) | ORAL | Status: DC
  Administered 2018-01-09 – 2018-01-11 (×8): 500 mg via ORAL
  Filled 2018-01-08 (×9): qty 1

## 2018-01-08 MED ORDER — SODIUM BICARBONATE 8.4 % IV SOLN
50.0000 meq | Freq: Once | INTRAVENOUS | Status: AC
  Administered 2018-01-08: 50 meq via INTRAVENOUS
  Filled 2018-01-08: qty 50

## 2018-01-08 MED ORDER — SODIUM CHLORIDE 0.9 % IV SOLN
INTRAVENOUS | Status: DC
  Administered 2018-01-08: 23:00:00 via INTRAVENOUS

## 2018-01-08 MED ORDER — SODIUM CHLORIDE 0.9 % IV BOLUS
1000.0000 mL | Freq: Once | INTRAVENOUS | Status: AC
  Administered 2018-01-08: 1000 mL via INTRAVENOUS

## 2018-01-08 MED ORDER — LEUCOVORIN CALCIUM INJECTION 100 MG
25.0000 mg | Freq: Four times a day (QID) | INTRAMUSCULAR | Status: DC
  Administered 2018-01-08 – 2018-01-10 (×7): 26 mg via INTRAVENOUS
  Filled 2018-01-08 (×11): qty 1.3

## 2018-01-08 MED ORDER — ACETAMINOPHEN 325 MG PO TABS
650.0000 mg | ORAL_TABLET | Freq: Four times a day (QID) | ORAL | Status: DC | PRN
  Filled 2018-01-08: qty 2

## 2018-01-08 MED ORDER — PREDNISONE 5 MG PO TABS
10.0000 mg | ORAL_TABLET | Freq: Every day | ORAL | Status: DC
  Administered 2018-01-09 – 2018-01-11 (×3): 10 mg via ORAL
  Filled 2018-01-08 (×3): qty 2

## 2018-01-08 MED ORDER — ONDANSETRON HCL 4 MG/2ML IJ SOLN: 4.0000 mg | Freq: Four times a day (QID) | INTRAMUSCULAR | Status: DC | PRN

## 2018-01-08 MED ORDER — TRAZODONE HCL 50 MG PO TABS
50.0000 mg | ORAL_TABLET | Freq: Every day | ORAL | Status: DC
  Administered 2018-01-09 – 2018-01-12 (×5): 50 mg via ORAL
  Filled 2018-01-08 (×5): qty 1

## 2018-01-08 MED ORDER — FERROUS SULFATE 325 (65 FE) MG PO TABS
325.0000 mg | ORAL_TABLET | Freq: Two times a day (BID) | ORAL | Status: DC
  Administered 2018-01-09 – 2018-01-13 (×9): 325 mg via ORAL
  Filled 2018-01-08 (×11): qty 1

## 2018-01-08 MED ORDER — ONDANSETRON HCL 4 MG PO TABS: 4.0000 mg | ORAL_TABLET | Freq: Four times a day (QID) | ORAL | Status: DC | PRN

## 2018-01-08 NOTE — ED Notes (Signed)
Pt states she took methotrexate 7 day course in 3 days

## 2018-01-08 NOTE — ED Provider Notes (Signed)
[place] DEPT Provider Note   CSN: [id number] Arrival date & time: 01/08/18  1350     History   Chief Complaint Chief Complaint  Patient presents with  . Dysuria  . Hematuria  . Oral Swelling    HPI [name] [name] is a 22 y.o. female.  HPI  Patient with history of lupus, recently restarted on methotrexate presents after accidentally injecting herself daily with methotrexate rather than weekly.  She states that this past Friday Saturday and Sunday she injected 25 mg of methotrexate.  She has been on this medication in the past and has not had any side effects or problems but states that she misread the directions and injected herself daily.  She has developed oral ulcerations and pain with swallowing and so has not been able to eat or drink much in the past couple days.  She did not have oral ulcerations associated with her lupus prior to this.  She has no fever or chills.  She has no change in her joint pain.  She does have a rash that has developed on both of her feet.  She has no difficulty breathing.  No swelling of her lips or tongue other than the areas of irritation which are bleeding in her and her lips.  No vomiting or change in stools.  There are no other associated systemic symptoms, there are no other alleviating or modifying factors.   Past Medical History:  Diagnosis Date  . Lupus ([place])   . Raynaud disease     Patient Active Problem List   Diagnosis Date Noted  . Accidental methotrexate overdose 01/08/2018  . Depression 02/08/2017  . Primary osteoarthritis of both knees 11/09/2016  . Chronic midline low back pain without sciatica 11/09/2016  . Vitamin D deficiency 11/09/2016  . SLE (systemic lupus erythematosus) ([place]) 09/19/2016  . High risk medication use 09/19/2016  . Insomnia 01/29/2016  . Depo-Provera contraceptive status 08/11/2015  . Raynaud's disease 08/11/2015  . Seasonal allergies 08/11/2015  . IDA (iron deficiency  anemia) 08/11/2015    Past Surgical History:  Procedure Laterality Date  . FRACTURE SURGERY    . TONSILLECTOMY       OB History   None      Home Medications    Prior to Admission medications   Medication Sig Start Date End Date Taking? Authorizing Provider  Carboxymethylcellulose Sodium (EYE DROPS OP) Place 2 drops into both eyes daily as needed (dry eyes).   Yes [provider]  diphenhydrAMINE HCl (BENADRYL ALLERGY PO) Take by mouth as needed.   Yes [provider]  ferrous sulfate 325 (65 FE) MG tablet Take 1 tablet (325 mg total) by mouth 2 (two) times daily. With 1/2 glass of orange juice 12/15/15  Yes Davonna Belling, MD  folic acid (FOLVITE) 1 MG tablet TAKE 2 TABLETS (2 MG TOTAL) BY MOUTH DAILY. 12/11/17  Yes Deveshwar, Abel Presto, MD  methocarbamol (ROBAXIN) 500 MG tablet Take 1 tablet (500 mg total) by mouth 3 (three) times daily. 12/19/17  Yes Deveshwar, Abel Presto, MD  methotrexate 50 MG/2ML injection INJECT 1 ML (25 MG TOTAL) INTO THE SKIN ONCE A WEEK. 05/16/17  Yes Deveshwar, Abel Presto, MD  predniSONE (DELTASONE) 5 MG tablet Take 1 tablet (5 mg total) by mouth daily with breakfast. 12/11/17  Yes Deveshwar, Abel Presto, MD  traZODone (DESYREL) 50 MG tablet Take 50 mg by mouth at bedtime. 02/14/17  Yes [provider]  Vitamin D, Ergocalciferol, (DRISDOL) 50000 units CAPS capsule Take  1 capsule (50,000 Units total) by mouth 2 (two) times a week. 12/21/17  Yes Deveshwar, Abel Presto, MD  Tuberculin-Allergy Syringes 27G X 1/2" 1 ML KIT Inject 1 Syringe into the skin once a week. To be used with weekly methotrexate injections. Patient not taking: Reported on 12/11/2017 11/09/16   Bo Merino, MD    Family History Family History  Problem Relation Age of Onset  . Aneurysm Mother   . Stroke Sister   . Seizures Sister     Social History Social History   Tobacco Use  . Smoking status: Never Smoker  . Smokeless tobacco: Never Used  Substance Use Topics  . Alcohol use:  No  . Drug use: No     Allergies   Hydroxychloroquine and Latex   Review of Systems Review of Systems  ROS reviewed and all otherwise negative except for mentioned in HPI   Physical Exam Updated Vital Signs BP (!) 137/103 (BP Location: Left Arm)   Pulse (!) 102   Temp 98.7 F (37.1 C) (Oral)   Resp 17   Ht 5' 3"  (1.6 m)   Wt 78 kg (172 lb)   LMP 01/08/2018   SpO2 98%   BMI 30.47 kg/m  Vitals reviewed Physical Exam  Physical Examination: General appearance - alert, uncomfortable appearing, and in no distress Mental status - alert, oriented to person, place, and time Eyes - no conjunctival injection, no scleral icterus Mouth - mucous membranes dry, diffuse oral ulcerations in inner lower lip and hard and soft palate Neck - supple, no significant adenopathy Chest - clear to auscultation, no wheezes, rales or rhonchi, symmetric air entry Heart - normal rate, regular rhythm, normal S1, S2, no murmurs, rubs, clicks or gallops Neurological - alert, oriented x 3, normal speech Extremities - peripheral pulses normal, no pedal edema, no clubbing or cyanosis Skin - normal coloration and turgor   ED Treatments / Results  Labs (all labs ordered are listed, but only abnormal results are displayed) Labs Reviewed  URINALYSIS, ROUTINE W REFLEX MICROSCOPIC - Abnormal; Notable for the following components:      Result Value   APPearance HAZY (*)    Hgb urine dipstick LARGE (*)    Ketones, ur 80 (*)    Protein, ur 100 (*)    Leukocytes, UA LARGE (*)    RBC / HPF >50 (*)    WBC, UA >50 (*)    Bacteria, UA RARE (*)    All other components within normal limits  CBC WITH DIFFERENTIAL/PLATELET - Abnormal; Notable for the following components:   WBC 1.2 (*)    Hemoglobin 11.2 (*)    HCT 34.7 (*)    Neutro Abs 0.9 (*)    Lymphs Abs 0.3 (*)    Monocytes Absolute 0.0 (*)    All other components within normal limits  COMPREHENSIVE METABOLIC PANEL - Abnormal; Notable for the  following components:   Calcium 8.8 (*)    Albumin 3.4 (*)    AST 62 (*)    ALT 88 (*)    All other components within normal limits  SEDIMENTATION RATE - Abnormal; Notable for the following components:   Sed Rate 47 (*)    All other components within normal limits  URINE CULTURE  PATHOLOGIST SMEAR REVIEW  HIV ANTIBODY (ROUTINE TESTING)  CBC  COMPREHENSIVE METABOLIC PANEL  I-STAT BETA HCG BLOOD, ED ([facility], [facility], [facility] ONLY)    EKG None  Radiology No results found.  Procedures Procedures (including critical care time)  Medications Ordered in ED Medications  leucovorin injection 26 mg (26 mg Intravenous Given 01/08/18 2119)  sodium bicarbonate 150 mEq in dextrose 5 % 1,000 mL infusion ( Intravenous New Bag/Given 01/08/18 2212)  acetaminophen (TYLENOL) tablet 650 mg (has no administration in time range)    Or  acetaminophen (TYLENOL) suppository 650 mg (has no administration in time range)  ondansetron (ZOFRAN) tablet 4 mg (has no administration in time range)    Or  ondansetron (ZOFRAN) injection 4 mg (has no administration in time range)  gi cocktail (Maalox,Lidocaine,Donnatal) (has no administration in time range)  morphine 2 MG/ML injection 2-4 mg (has no administration in time range)  methocarbamol (ROBAXIN) tablet 500 mg (has no administration in time range)  predniSONE (DELTASONE) tablet 10 mg (has no administration in time range)  traZODone (DESYREL) tablet 50 mg (has no administration in time range)  ferrous sulfate tablet 325 mg (has no administration in time range)  sodium chloride 0.9 % bolus 1,000 mL (0 mLs Intravenous Stopped 01/08/18 2055)  morphine 4 MG/ML injection 4 mg (4 mg Intravenous Given 01/08/18 1745)  sodium bicarbonate injection 50 mEq (50 mEq Intravenous Given 01/08/18 2156)     Initial Impression / Assessment and Plan / ED Course  I have reviewed the triage vital signs and the nursing notes.  Pertinent labs & imaging results that were available during my  care of the patient were reviewed by me and considered in my medical decision making (see chart for details).    5:32 PM  D/w pharmacist, Sharyn Lull, she agrees with checking CBC, CMET, urine- some chemo regimens use 48m methotrexate in one dose- but her labs would let uKoreaknow if she needs leucovorin.    6:59 PM  [facility]  D/w poison control as WBC and ANC are low, mild elevation in transaminases- they are going to talk with toxicologist and call me back with a plan.    8:35 PM plan from toxicologist is leucovorin 278mIV every 6 hours for 72 hours, recommends alkalanize urine x 12 hours, send methotrexate level.  Will call hospitalist for admission.     Final Clinical Impressions(s) / ED Diagnoses   Final diagnoses:  Methotrexate toxicity, accidental or unintentional, initial encounter    ED Discharge Orders    None       Quinnie Barcelo, MaForbes CellarMD 01/08/18 [phone]

## 2018-01-08 NOTE — ED Notes (Signed)
Date and time results received: 01/08/18 18:23  Test: WBC  Critical Value: 1.2k/uL  Name of Provider Notified: Dr. Marcha Dutton in person @ 18:24 and Ubaldo Glassing, RN in person.   Orders Received? Or Actions Taken?: Will continue to monitor and await new orders.

## 2018-01-08 NOTE — ED Triage Notes (Addendum)
Patient c/o dysuria and hematuria x 3 days. Patient reports that she read her methatrexate RX wrong and has been giving her herself injections x daily x 3 days instead of once weekly. Patient has swelling to her lips , tongue, and inside of her mouth. Patient is able to maintain oral secretions.

## 2018-01-08 NOTE — ED Notes (Signed)
ED TO INPATIENT HANDOFF REPORT  Name/Age/Gender [name] [name] 22 y.o. female  Code Status    Code Status Orders  (From admission, onward)        Start     Ordered   01/08/18 2306  Full code  Continuous     01/08/18 2307    Code Status History    This patient has a current code status but no historical code status.      Home/SNF/Other Home  Chief Complaint Oral Swelling; Burning while Urinating  Level of Care/Admitting Diagnosis ED Disposition    ED Disposition Condition Comment   Admit  Hospital Area: [facility] [[id number]]  Level of Care: Med-Surg [16]  Diagnosis: Accidental methotrexate overdose, initial encounter [700174]  Admitting Physician: Doreatha Massed  Attending Physician: Etta Quill [[id number]]  Estimated length of stay: past midnight tomorrow  Certification:: I certify this patient will need inpatient services for at least 2 midnights  PT Class (Do Not Modify): Inpatient [101]  PT Acc Code (Do Not Modify): Private [1]       Medical History Past Medical History:  Diagnosis Date  . Lupus ([place])   . Raynaud disease     Allergies Allergies  Allergen Reactions  . Hydroxychloroquine Rash  . Latex Rash    IV Location/Drains/Wounds Patient Lines/Drains/Airways Status   Active Line/Drains/Airways    Name:   Placement date:   Placement time:   Site:   Days:   Peripheral IV 01/08/18 Right Antecubital   01/08/18    1702    Antecubital   less than 1          Labs/Imaging Results for orders placed or performed during the hospital encounter of 01/08/18 (from the past 48 hour(s))  I-Stat beta hCG blood, ED     Status: None   Collection Time: 01/08/18  5:07 PM  Result Value Ref Range   I-stat hCG, quantitative <5.0 <5 mIU/mL   Comment 3            Comment:   GEST. AGE      CONC.  (mIU/mL)   <=1 WEEK        5 - 50     2 WEEKS       50 - 500     3 WEEKS       100 - 10,000     4 WEEKS     1,000 - 30,000        FEMALE  AND NON-PREGNANT FEMALE:     LESS THAN 5 mIU/mL   CBC with Differential/Platelet     Status: Abnormal   Collection Time: 01/08/18  5:47 PM  Result Value Ref Range   WBC 1.2 (LL) 4.0 - 10.5 K/uL    Comment: REPEATED TO VERIFY CRITICAL RESULT CALLED TO, READ BACK BY AND VERIFIED WITH: Dolores Hoose [id number] @ 1825 BY J SCOTTON    RBC 4.08 3.87 - 5.11 MIL/uL   Hemoglobin 11.2 (L) 12.0 - 15.0 g/dL   HCT 34.7 (L) 36.0 - 46.0 %   MCV 85.0 78.0 - 100.0 fL   MCH 27.5 26.0 - 34.0 pg   MCHC 32.3 30.0 - 36.0 g/dL   RDW 13.7 11.5 - 15.5 %   Platelets 251 150 - 400 K/uL   Neutrophils Relative % 70 %   Lymphocytes Relative 28 %   Monocytes Relative 1 %   Eosinophils Relative 1 %   Basophils Relative 0 %   Neutro  Abs 0.9 (L) 1.7 - 7.7 K/uL   Lymphs Abs 0.3 (L) 0.7 - 4.0 K/uL   Monocytes Absolute 0.0 (L) 0.1 - 1.0 K/uL   Eosinophils Absolute 0.0 0.0 - 0.7 K/uL   Basophils Absolute 0.0 0.0 - 0.1 K/uL   WBC Morphology WHITE COUNT CONFIRMED ON SMEAR     Comment: Performed at [facility], [place] [address], [place], [place] [address]  Comprehensive metabolic panel     Status: Abnormal   Collection Time: 01/08/18  5:47 PM  Result Value Ref Range   Sodium 139 135 - 145 mmol/L   Potassium 3.8 3.5 - 5.1 mmol/L   Chloride 105 101 - 111 mmol/L   CO2 22 22 - 32 mmol/L   Glucose, Bld 71 65 - 99 mg/dL   BUN 12 6 - 20 mg/dL   Creatinine, Ser 0.56 0.44 - 1.00 mg/dL   Calcium 8.8 (L) 8.9 - 10.3 mg/dL   Total Protein 7.7 6.5 - 8.1 g/dL   Albumin 3.4 (L) 3.5 - 5.0 g/dL   AST 62 (H) 15 - 41 U/L   ALT 88 (H) 14 - 54 U/L   Alkaline Phosphatase 60 38 - 126 U/L   Total Bilirubin 0.9 0.3 - 1.2 mg/dL   GFR calc non Af Amer >60 >60 mL/min   GFR calc Af Amer >60 >60 mL/min    Comment: (NOTE) The eGFR has been calculated using the CKD EPI equation. This calculation has not been validated in all clinical situations. eGFR's persistently <60 mL/min signify possible Chronic Kidney Disease.     Anion gap 12 5 - 15    Comment: Performed at [facility], [place] [address], [place], [place] [address]  Sedimentation rate     Status: Abnormal   Collection Time: 01/08/18  5:47 PM  Result Value Ref Range   Sed Rate 47 (H) 0 - 22 mm/hr    Comment: Performed at [facility], [place] [address], [place], [place] [address]  Urinalysis, Routine w reflex microscopic- may I&O cath if menses     Status: Abnormal   Collection Time: 01/08/18  7:10 PM  Result Value Ref Range   Color, Urine YELLOW YELLOW   APPearance HAZY (A) CLEAR   Specific Gravity, Urine 1.017 1.005 - 1.030   pH 6.0 5.0 - 8.0   Glucose, UA NEGATIVE NEGATIVE mg/dL   Hgb urine dipstick LARGE (A) NEGATIVE   Bilirubin Urine NEGATIVE NEGATIVE   Ketones, ur 80 (A) NEGATIVE mg/dL   Protein, ur 100 (A) NEGATIVE mg/dL   Nitrite NEGATIVE NEGATIVE   Leukocytes, UA LARGE (A) NEGATIVE   RBC / HPF >50 (H) 0 - 5 RBC/hpf   WBC, UA >50 (H) 0 - 5 WBC/hpf   Bacteria, UA RARE (A) NONE SEEN   Squamous Epithelial / LPF 11-20 0 - 5    Comment: Please note change in reference range.   Mucus PRESENT     Comment: Performed at [facility], [place] [address], [place],  [address]   No results found.  Pending Labs Unresulted Labs (From admission, onward)   Start     Ordered   01/09/18 0500  CBC  Tomorrow morning,   R     01/08/18 2307   01/09/18 0350  Basic metabolic panel  Tomorrow morning,   R     01/08/18 2307   01/08/18 2306  HIV antibody (Routine Testing)  Once,   R  01/08/18 2307   01/08/18 2236  Urine Culture  Once,   STAT     01/08/18 2236   01/08/18 1747  Pathologist smear review  Once,   R     01/08/18 1747      Vitals/Pain Today's Vitals   01/08/18 1702 01/08/18 1824 01/08/18 1910 01/08/18 2117  BP:  125/87  (!) 137/103  Pulse:  (!) 103  (!) 102  Resp:  20  17  Temp:    98.7 F (37.1 C)  TempSrc:    Oral  SpO2:  99%  98%  Weight:      Height:      PainSc:  10-Worst pain ever  4  10-Worst pain ever    Isolation Precautions No active isolations  Medications Medications  leucovorin injection 26 mg (26 mg Intravenous Given 01/08/18 2119)  sodium bicarbonate 150 mEq in dextrose 5 % 1,000 mL infusion ( Intravenous New Bag/Given 01/08/18 2212)  acetaminophen (TYLENOL) tablet 650 mg (has no administration in time range)    Or  acetaminophen (TYLENOL) suppository 650 mg (has no administration in time range)  ondansetron (ZOFRAN) tablet 4 mg (has no administration in time range)    Or  ondansetron (ZOFRAN) injection 4 mg (has no administration in time range)  gi cocktail (Maalox,Lidocaine,Donnatal) (has no administration in time range)  morphine 2 MG/ML injection 2-4 mg (has no administration in time range)  sodium chloride 0.9 % bolus 1,000 mL (0 mLs Intravenous Stopped 01/08/18 2055)  morphine 4 MG/ML injection 4 mg (4 mg Intravenous Given 01/08/18 1745)  sodium bicarbonate injection 50 mEq (50 mEq Intravenous Given 01/08/18 2156)    Mobility walks

## 2018-01-08 NOTE — H&P (Signed)
History and Physical    [name] [name] [id number] DOB: July 07, 1996 DOA: 01/08/2018  PCP: Lucianne Lei, MD  Patient coming from: Home  I have personally briefly reviewed patient's old medical records in [place]  Chief Complaint: Hematuria, oral swelling  HPI: [name] [name] is a 22 y.o. female with medical history significant of SLE.  Patient recently restarted on methotrexate therapy these past few days.  Presents to ED with oral swelling and hematuria.  She reports that she accidentally miss-read the prescription and was injecting herself with 51m MTX daily instead of weekly for the past 3 days (Friday, Sat, Sunday).  She developed oral ulcerations, pain with swallowing, hematuria and dysuria, rash on feet.  ED Course: Poison control contacted: plan from toxicologist is leucovorin 270mIV every 6 hours for 72 hours, recommends alkalanize urine x 12 hours, send methotrexate level.   Review of Systems: As per HPI otherwise 10 point review of systems negative.   Past Medical History:  Diagnosis Date  . Lupus ([place]  . Raynaud disease     Past Surgical History:  Procedure Laterality Date  . FRACTURE SURGERY    . TONSILLECTOMY       reports that she has never smoked. She has never used smokeless tobacco. She reports that she does not drink alcohol or use drugs.  Allergies  Allergen Reactions  . Hydroxychloroquine Rash  . Latex Rash    Family History  Problem Relation Age of Onset  . Aneurysm Mother   . Stroke Sister   . Seizures Sister      Prior to Admission medications   Medication Sig Start Date End Date Taking? Authorizing Provider  Carboxymethylcellulose Sodium (EYE DROPS OP) Place 2 drops into both eyes daily as needed (dry eyes).   Yes [provider]  diphenhydrAMINE HCl (BENADRYL ALLERGY PO) Take by mouth as needed.   Yes [provider]  ferrous sulfate 325 (65 FE) MG tablet Take 1 tablet (325 mg total) by mouth 2 (two) times daily.  With 1/2 glass of orange juice 12/15/15  Yes PiDavonna BellingMD  folic acid (FOLVITE) 1 MG tablet TAKE 2 TABLETS (2 MG TOTAL) BY MOUTH DAILY. 12/11/17  Yes Deveshwar, ShAbel PrestoMD  methocarbamol (ROBAXIN) 500 MG tablet Take 1 tablet (500 mg total) by mouth 3 (three) times daily. 12/19/17  Yes Deveshwar, ShAbel PrestoMD  methotrexate 50 MG/2ML injection INJECT 1 ML (25 MG TOTAL) INTO THE SKIN ONCE A WEEK. 05/16/17  Yes Deveshwar, ShAbel PrestoMD  predniSONE (DELTASONE) 5 MG tablet Take 1 tablet (5 mg total) by mouth daily with breakfast. 12/11/17  Yes Deveshwar, ShAbel PrestoMD  traZODone (DESYREL) 50 MG tablet Take 50 mg by mouth at bedtime. 02/14/17  Yes [provider]  Vitamin D, Ergocalciferol, (DRISDOL) 50000 units CAPS capsule Take 1 capsule (50,000 Units total) by mouth 2 (two) times a week. 12/21/17  Yes Deveshwar, ShAbel PrestoMD  Belimumab (BENLYSTA) 200 MG/ML SOAJ Inject 200 mg into the skin once a week. Patient not taking: Reported on 12/11/2017 04/13/17   DeBo MerinoMD  mupirocin cream (BACTROBAN) 2 % Apply 1 application topically 2 (two) times daily. Patient not taking: Reported on 01/08/2018 02/07/17   PfCharlesetta ShanksMD  Tuberculin-Allergy Syringes 27G X 1/2" 1 ML KIT Inject 1 Syringe into the skin once a week. To be used with weekly methotrexate injections. Patient not taking: Reported on 12/11/2017 11/09/16   DeBo MerinoMD    Physical Exam: Vitals:   01/08/18  1426 01/08/18 1534 01/08/18 1824 01/08/18 2117  BP: 123/88  125/87 (!) 137/103  Pulse: 74  (!) 103 (!) 102  Resp: _0 Temp: 98.9 F (37.2 C)   98.7 F (37.1 C)  TempSrc: Oral   Oral  SpO2: 100%  99% 98%  Weight:  78 kg (172 lb)    Height:  _1  (1.6 m)      Constitutional: NAD, calm, comfortable Eyes: PERRL, lids and conjunctivae normal ENMT: PO ulcerations, blood on lips Neck: normal, supple, no masses, no thyromegaly Respiratory: clear to auscultation bilaterally, no wheezing, no crackles. Normal respiratory  effort. No accessory muscle use.  Cardiovascular: Regular rate and rhythm, no murmurs / rubs / gallops. No extremity edema. 2+ pedal pulses. No carotid bruits.  Abdomen: no tenderness, no masses palpated. No hepatosplenomegaly. Bowel sounds positive.  Musculoskeletal: no clubbing / cyanosis. No joint deformity upper and lower extremities. Good ROM, no contractures. Normal muscle tone.  Skin: no rashes, lesions, ulcers. No induration Neurologic: CN 2-12 grossly intact. Sensation intact, DTR normal. Strength 5/5 in all 4.  Psychiatric: Normal judgment and insight. Alert and oriented x 3. Normal mood.    Labs on Admission: I have personally reviewed following labs and imaging studies  CBC: Recent Labs  Lab 01/08/18 1747  WBC 1.2*  NEUTROABS 0.9*  HGB 11.2*  HCT 34.7*  MCV 85.0  PLT 121   Basic Metabolic Panel: Recent Labs  Lab 01/08/18 1747  NA 139  K 3.8  CL 105  CO2 22  GLUCOSE 71  BUN 12  CREATININE 0.56  CALCIUM 8.8*   GFR: Estimated Creatinine Clearance: 108.1 mL/min (by C-G formula based on SCr of 0.56 mg/dL). Liver Function Tests: Recent Labs  Lab 01/08/18 1747  AST 62*  ALT 88*  ALKPHOS 60  BILITOT 0.9  PROT 7.7  ALBUMIN 3.4*   No results for input(s): LIPASE, AMYLASE in the last 168 hours. No results for input(s): AMMONIA in the last 168 hours. Coagulation Profile: No results for input(s): INR, PROTIME in the last 168 hours. Cardiac Enzymes: No results for input(s): CKTOTAL, CKMB, CKMBINDEX, TROPONINI in the last 168 hours. BNP (last 3 results) No results for input(s): PROBNP in the last 8760 hours. HbA1C: No results for input(s): HGBA1C in the last 72 hours. CBG: No results for input(s): GLUCAP in the last 168 hours. Lipid Profile: No results for input(s): CHOL, HDL, LDLCALC, TRIG, CHOLHDL, LDLDIRECT in the last 72 hours. Thyroid Function Tests: No results for input(s): TSH, T4TOTAL, FREET4, T3FREE, THYROIDAB in the last 72 hours. Anemia  Panel: No results for input(s): VITAMINB12, FOLATE, FERRITIN, TIBC, IRON, RETICCTPCT in the last 72 hours. Urine analysis:    Component Value Date/Time   COLORURINE YELLOW 01/08/2018 1910   APPEARANCEUR HAZY (A) 01/08/2018 1910   LABSPEC 1.017 01/08/2018 1910   PHURINE 6.0 01/08/2018 1910   GLUCOSEU NEGATIVE 01/08/2018 1910   HGBUR LARGE (A) 01/08/2018 1910   BILIRUBINUR NEGATIVE 01/08/2018 1910   BILIRUBINUR negative 01/29/2016 1508   KETONESUR 80 (A) 01/08/2018 1910   PROTEINUR 100 (A) 01/08/2018 1910   UROBILINOGEN 1.0 01/29/2016 1508   NITRITE NEGATIVE 01/08/2018 1910   LEUKOCYTESUR LARGE (A) 01/08/2018 1910    Radiological Exams on Admission: No results found.  EKG: Independently reviewed.  Assessment/Plan Principal Problem:   Accidental methotrexate overdose Active Problems:   SLE (systemic lupus erythematosus) (HCC)    1. Accidental MTX OD - 1. Per poison control: 1. Alkalinize urine for 12h (  bicarb gtt ordered) 2. Leucovorin 4m Q6H for 72h 2. Per Dr. MEarlie Server 1. Monitor WBC 2. No neulasta unless ANC drops below 500 3. No need for ABX unless she is running a fever, leuks in urine probably just mucositis. 3. Repeat CBC/ BMP in AM 4. Pain control with GI cocktail, morphine PRN if needed. 2. SLE - 1. Cont prednisone 2. Obviously, holding MTX  DVT prophylaxis: SCDs Code Status: Full Family Communication: Family at bedside Disposition Plan: Home after Leucovorin course if WBC stabilizes Consults called: Spoke with Dr. MEarlie Serveras above, not formally asked to consult at this time though. Admission status: Admit to inpatient   GPalmyra JSouth TaftHospitalists Pager [phone] If 7AM-7PM, please contact day team taking care of patient [email or web address] Password [email or web address] 01/08/2018, 11:08 PM

## 2018-01-09 ENCOUNTER — Other Ambulatory Visit: Payer: Self-pay

## 2018-01-09 LAB — COMPREHENSIVE METABOLIC PANEL
ALBUMIN: 2.9 g/dL — AB (ref 3.5–5.0)
ALT: 58 U/L — AB (ref 14–54)
AST: 39 U/L (ref 15–41)
Alkaline Phosphatase: 50 U/L (ref 38–126)
Anion gap: 10 (ref 5–15)
BUN: 9 mg/dL (ref 6–20)
CHLORIDE: 102 mmol/L (ref 101–111)
CO2: 26 mmol/L (ref 22–32)
CREATININE: 0.47 mg/dL (ref 0.44–1.00)
Calcium: 8.1 mg/dL — ABNORMAL LOW (ref 8.9–10.3)
GFR calc Af Amer: >60 mL/min (ref >60)
GFR calc non Af Amer: >60 mL/min (ref >60)
GLUCOSE: 81 mg/dL (ref 65–99)
Potassium: 3.3 mmol/L — ABNORMAL LOW (ref 3.5–5.1)
SODIUM: 138 mmol/L (ref 135–145)
Total Bilirubin: 0.9 mg/dL (ref 0.3–1.2)
Total Protein: 6.6 g/dL (ref 6.5–8.1)

## 2018-01-09 LAB — CBC
HCT: 30.5 % — ABNORMAL LOW (ref 36.0–46.0)
Hemoglobin: 10.2 g/dL — ABNORMAL LOW (ref 12.0–15.0)
MCH: 28.4 pg (ref 26.0–34.0)
MCHC: 33.4 g/dL (ref 30.0–36.0)
MCV: 85 fL (ref 78.0–100.0)
PLATELETS: 213 10*3/uL (ref 150–400)
RBC: 3.59 MIL/uL — AB (ref 3.87–5.11)
RDW: 13.3 % (ref 11.5–15.5)
WBC: 1.4 10*3/uL — AB (ref 4.0–10.5)

## 2018-01-09 LAB — HIV ANTIBODY (ROUTINE TESTING W REFLEX): HIV Screen 4th Generation wRfx: NONREACTIVE

## 2018-01-09 LAB — PATHOLOGIST SMEAR REVIEW

## 2018-01-09 MED ORDER — KCL IN DEXTROSE-NACL 20-5-0.45 MEQ/L-%-% IV SOLN
INTRAVENOUS | Status: DC
  Administered 2018-01-09 – 2018-01-11 (×4): via INTRAVENOUS
  Filled 2018-01-09 (×5): qty 1000

## 2018-01-09 MED ORDER — FAMOTIDINE IN NACL 20-0.9 MG/50ML-% IV SOLN
20.0000 mg | Freq: Two times a day (BID) | INTRAVENOUS | Status: DC
  Administered 2018-01-09 – 2018-01-12 (×7): 20 mg via INTRAVENOUS
  Filled 2018-01-09 (×8): qty 50

## 2018-01-09 MED ORDER — KETOROLAC TROMETHAMINE 15 MG/ML IJ SOLN
15.0000 mg | Freq: Four times a day (QID) | INTRAMUSCULAR | Status: DC | PRN
  Administered 2018-01-10 – 2018-01-12 (×3): 15 mg via INTRAVENOUS
  Filled 2018-01-09 (×3): qty 1

## 2018-01-09 NOTE — Plan of Care (Signed)
Reviewed plan of care, safety precautions, and pain control. Pt and father both attentive and verbalized understanding of all education.

## 2018-01-09 NOTE — Progress Notes (Signed)
Patient continues to bleed from mouth, spits frequently, encouraging mouth rising and avoiding swallowing blood. Per patient request, after eating, nurse applies abd pad to chin and secures with kerlix under chin and over head to collect sanguinous saliva. Will cont to monitor.

## 2018-01-09 NOTE — Progress Notes (Signed)
Bleeding from mouth appears to have subsided somewhat. Patient tolerated small amt of clear liquids. Applied abd to chin and secured with kerlix per patient preference. Will cont to monitor.

## 2018-01-09 NOTE — Progress Notes (Signed)
PROGRESS NOTE    [name] [name]  VOH:[id number] DOB: 11-11-1995 DOA: 01/08/2018 PCP: [name] Lei, MD    Brief Narrative:  22 year old female who presented with hematuria and oral swelling.  She does have a significant past medical history of systemic lupus erythematosus.  She was recently started on methotrexate injections as an outpatient. She noticed acute development of oral ulcers, oral mucosa swelling and pain, associated with rash in her lower extremities, dysuria and hematuria.  Apparently she injected  herself 25 mg of methotrexate daily for 3 days, she accidentally confused the instructions, she was prescribed 25 mg of methotrexate weekly injections.  On initial physical examination blood pressure 123/88, heart rate 74, respiratory 15, temperature 98.9, oxygen saturation 100%.  She had mucosal swelling, oral ulcerations, her lungs are clear to auscultation, heart S1-S2 present rhythmic,, gallops or murmurs.  Her abdomen was soft nontender, no lower extremity edema, she had discoloration rash/ecchymotic lesions in her feet.  Sodium 139, potassium 3.8, chloride 105, bicarb 22, glucose 71, BUN 12, creatinine 0.56, white count 1.2, hemoglobin 11.2, hematocrit 34.7, platelets 251.  Urinalysis with 100 protein, specific gravity 1.017, red cells greater than 50, white cells greater than 50.  Pregnancy test negative.   Patient was admitted to the hospital with a working diagnosis of methotrexate intoxication due to accidental overdose.    Assessment & Plan:   Principal Problem:   Accidental methotrexate overdose Active Problems:   SLE (systemic lupus erythematosus) (HCC)   1.  Methotrexate intoxication. Will continue urine alkalinization with bicarb infusion for 12 hours, continue Leucovorin 25 mg q 6 hours, for 72 hours. Will resume hydration with dextrose half normal saline and kcl IV. Follow on methotrexate levels. Add antiacid therapy with IV famotidine. Continue IV analgesics with morphine  and will add Ketorolac IV.   2.  Systemic lupus erythematous. Patient follows with [facility], apparently patient's symptoms are related to methotrexate toxicity and not from disease activation, continue close follow up. Noted erythema nodosum at the feet. Continue home dose of prednisone.   3.  Leukopenia. WBC at 1,4 from 1,2, hb stable at 10 with plt at 213, will continue close follow up on cell count.   4. Depression. Continue trazodone, no agitation or confusion.   DVT prophylaxis: enoxaprin  Code Status: full Family Communication: I spoke with patient's family at the bedside and all questions were addressed.  Disposition Plan: home when clinically improved.    Consultants:     Procedures:     Antimicrobials:       Subjective: Patient with persistent pain in the oral mucosa, unable to eat. No nausea or vomiting. No abdominal pain.   Objective: Vitals:   01/08/18 2117 01/09/18 0003 01/09/18 0041 01/09/18 0556  BP: (!) 137/103 (!) 139/103 (!) 138/98 (!) 129/96  Pulse: (!) 102 (!) 104 (!) 106 (!) 112  Resp: 17 16 19 17   Temp: 98.7 F (37.1 C) 98.7 F (37.1 C) (!) 97.5 F (36.4 C) 98.9 F (37.2 C)  TempSrc: Oral Oral Oral Axillary  SpO2: 98% 100% 100% 100%  Weight:      Height:        Intake/Output Summary (Last 24 hours) at 01/09/2018 1230 Last data filed at 01/09/2018 1113 Gross per 24 hour  Intake 1216.25 ml  Output 250 ml  Net 966.25 ml   Filed Weights   01/08/18 1534  Weight: 78 kg (172 lb)    Examination:   General: deconditioned and ill looking appering.  Neurology:  Awake and alert, non focal  E ENT: mild pallor, no icterus, oral mucosa edematous at the gums and lips with bleeding ulcers.  Cardiovascular: No JVD. S1-S2 present, rhythmic, no gallops, rubs, or murmurs. No lower extremity edema. Pulmonary: vesicular breath sounds bilaterally, adequate air movement, no wheezing, rhonchi or rales. Gastrointestinal. Abdomen with no organomegaly, non  tender, no rebound or guarding Skin. Tender ecchymotic nodules at the right ankle, medical aspect and dorsal base of the first metatarsal on the left.  Musculoskeletal: no joint deformities     Data Reviewed: I have personally reviewed following labs and imaging studies  CBC: Recent Labs  Lab 01/08/18 1747 01/09/18 0626  WBC 1.2* 1.4*  NEUTROABS 0.9*  --   HGB 11.2* 10.2*  HCT 34.7* 30.5*  MCV 85.0 85.0  PLT 251 921   Basic Metabolic Panel: Recent Labs  Lab 01/08/18 1747 01/09/18 0626  NA 139 138  K 3.8 3.3*  CL 105 102  CO2 22 26  GLUCOSE 71 81  BUN 12 9  CREATININE 0.56 0.47  CALCIUM 8.8* 8.1*   GFR: Estimated Creatinine Clearance: 108.1 mL/min (by C-G formula based on SCr of 0.47 mg/dL). Liver Function Tests: Recent Labs  Lab 01/08/18 1747 01/09/18 0626  AST 62* 39  ALT 88* 58*  ALKPHOS 60 50  BILITOT 0.9 0.9  PROT 7.7 6.6  ALBUMIN 3.4* 2.9*   No results for input(s): LIPASE, AMYLASE in the last 168 hours. No results for input(s): AMMONIA in the last 168 hours. Coagulation Profile: No results for input(s): INR, PROTIME in the last 168 hours. Cardiac Enzymes: No results for input(s): CKTOTAL, CKMB, CKMBINDEX, TROPONINI in the last 168 hours. BNP (last 3 results) No results for input(s): PROBNP in the last 8760 hours. HbA1C: No results for input(s): HGBA1C in the last 72 hours. CBG: No results for input(s): GLUCAP in the last 168 hours. Lipid Profile: No results for input(s): CHOL, HDL, LDLCALC, TRIG, CHOLHDL, LDLDIRECT in the last 72 hours. Thyroid Function Tests: No results for input(s): TSH, T4TOTAL, FREET4, T3FREE, THYROIDAB in the last 72 hours. Anemia Panel: No results for input(s): VITAMINB12, FOLATE, FERRITIN, TIBC, IRON, RETICCTPCT in the last 72 hours.    Radiology Studies: I have reviewed all of the imaging during this hospital visit personally     Scheduled Meds: . ferrous sulfate  325 mg Oral BID  . leucovorin  26 mg  Intravenous Q6H  . methocarbamol  500 mg Oral TID  . predniSONE  10 mg Oral Q breakfast  . traZODone  50 mg Oral QHS   Continuous Infusions: . dextrose 5 % and 0.45 % NaCl with KCl 20 mEq/L    . famotidine (PEPCID) IV       LOS: 1 day        Elon Eoff Gerome Apley, MD [facility] Pager [phone]

## 2018-01-09 NOTE — Progress Notes (Signed)
PROGRESS NOTE    [name] [name]  TKZ:[id number] DOB: Aug 01, 1996 DOA: 01/08/2018 PCP: Lucianne Lei, MD Outpatient Specialists:  Brief Narrative:  22 year old female with history significant of SLE. Patient restarted methotrexate therapy for SLE and presented to ED with oral ulcerations, pain with swallowing, hematuria, dysuria, rash on feet x 2 days. She reports that she read the prescription incorrectly and injected herself with 25mg  methotrexate daily instead of weekly for past 3 days (4/26-4/28). Vitals in ED were temp 98.9, BP 123/88, HR 74, RR 15, Sp02 100% on RA. Patient reported 10/10 bladder pain, aching, and burning. Labs: AST 62, ALT 88, WBC 1.3 Hgb 11.2, Sed Rate 47.  Poison control was contacted by the ED who recommended Leucovorin 25 mg IV q 6 hrs for 72 hours. They recommended to alkalinize urine for 12 hours and send methotrexate level. Patient was admitted on working diagnosis of accidental methotrexate overdose.    Assessment & Plan:  Accidental Methotrexate Overdose: Poison control was contacted who recommended Leucovorin 25 mg IV q 6 hrs for 72 hours, alkalinize urine for 12 hours, and send methotrexate level. Dr. Earlie Server was contacted by admitting physician and he recommended to monitor WBC, do not use Neulasta unless ANC drops below 500, no need for antibiotics unless she is febrile. WBC at 1.4 today, trending upward.  - bicarb gtt given overnight to alkalinize urine - continue 25 mg IV q 6 hours for a total of 72 hours - stat methotrexate level ordered at 12 hours - serial CBCs and CMP - continue to control bleeding with gauze wrap and flushing mouth with water - patient is not consuming food or liquids due to mouth ulceration and sore throat dextrose 5% and 0.45% NaCl with KCL 20 ordered  - added IV Pepcid - added Ketorolac for pain control  Systemic Lupus Erythematosus: - continue prednisone - holding methotrexate  DVT prophylaxis: SCD's Code Status: Full Family  Communication: None at bedside Disposition Plan: Home when clinically stable   Consultants:   None  Procedures:   None  Antimicrobials:   None   Subjective: Patient unable to communicate orally due to bleeding mouth ulcers and gauze wrapping to control active bleeding. She communicated history that brought her in, denied nausea, vomiting, diarrhea, or blood in stool. Endorses bladder pain and dysuria. Pointed out that ulcer on right foot and skin changes on left foot are new since started methotrexate on 4/26.  Objective: Vitals:   01/08/18 2117 01/09/18 0003 01/09/18 0041 01/09/18 0556  BP: (!) 137/103 (!) 139/103 (!) 138/98 (!) 129/96  Pulse: (!) 102 (!) 104 (!) 106 (!) 112  Resp: 17 16 19 17   Temp: 98.7 F (37.1 C) 98.7 F (37.1 C) (!) 97.5 F (36.4 C) 98.9 F (37.2 C)  TempSrc: Oral Oral Oral Axillary  SpO2: 98% 100% 100% 100%  Weight:      Height:        Intake/Output Summary (Last 24 hours) at 01/09/2018 0814 Last data filed at 01/09/2018 0600 Gross per 24 hour  Intake 825 ml  Output 250 ml  Net 575 ml   Filed Weights   01/08/18 1534  Weight: 78 kg (172 lb)    Examination:  General exam: Appears calm and comfortable, in no acute distress. ENMT: mouth ulcerations, actively bleeding Respiratory system: Clear to auscultation. Respiratory effort normal. Cardiovascular system: S1 & S2 heard, tachycardic, normal rhythm. No murmurs, rubs, gallops or clicks. No peripheral extremity edema.  Gastrointestinal system: Abdomen is nondistended,  soft, tender near bladder.  Central nervous system: Alert and oriented x 3. No focal neurological deficits. Extremities: Symmetric 5 x 5 power. Skin: ulcer on right medial foot, skin changes on great toe of left foot Psychiatry: Judgement and insight appear normal. Mood & affect appropriate.   Data Reviewed: I have personally reviewed following labs and imaging studies  CBC: Recent Labs  Lab 01/08/18 1747 01/09/18 0626    WBC 1.2* 1.4*  NEUTROABS 0.9*  --   HGB 11.2* 10.2*  HCT 34.7* 30.5*  MCV 85.0 85.0  PLT 251 025   Basic Metabolic Panel: Recent Labs  Lab 01/08/18 1747 01/09/18 0626  NA 139 138  K 3.8 3.3*  CL 105 102  CO2 22 26  GLUCOSE 71 81  BUN 12 9  CREATININE 0.56 0.47  CALCIUM 8.8* 8.1*   GFR: Estimated Creatinine Clearance: 108.1 mL/min (by C-G formula based on SCr of 0.47 mg/dL). Liver Function Tests: Recent Labs  Lab 01/08/18 1747 01/09/18 0626  AST 62* 39  ALT 88* 58*  ALKPHOS 60 50  BILITOT 0.9 0.9  PROT 7.7 6.6  ALBUMIN 3.4* 2.9*   No results for input(s): LIPASE, AMYLASE in the last 168 hours. No results for input(s): AMMONIA in the last 168 hours. Coagulation Profile: No results for input(s): INR, PROTIME in the last 168 hours. Cardiac Enzymes: No results for input(s): CKTOTAL, CKMB, CKMBINDEX, TROPONINI in the last 168 hours. BNP (last 3 results) No results for input(s): PROBNP in the last 8760 hours. HbA1C: No results for input(s): HGBA1C in the last 72 hours. CBG: No results for input(s): GLUCAP in the last 168 hours. Lipid Profile: No results for input(s): CHOL, HDL, LDLCALC, TRIG, CHOLHDL, LDLDIRECT in the last 72 hours. Thyroid Function Tests: No results for input(s): TSH, T4TOTAL, FREET4, T3FREE, THYROIDAB in the last 72 hours. Anemia Panel: No results for input(s): VITAMINB12, FOLATE, FERRITIN, TIBC, IRON, RETICCTPCT in the last 72 hours. Urine analysis:    Component Value Date/Time   COLORURINE YELLOW 01/08/2018 1910   APPEARANCEUR HAZY (A) 01/08/2018 1910   LABSPEC 1.017 01/08/2018 1910   PHURINE 6.0 01/08/2018 1910   GLUCOSEU NEGATIVE 01/08/2018 1910   HGBUR LARGE (A) 01/08/2018 1910   BILIRUBINUR NEGATIVE 01/08/2018 1910   BILIRUBINUR negative 01/29/2016 1508   KETONESUR 80 (A) 01/08/2018 1910   PROTEINUR 100 (A) 01/08/2018 1910   UROBILINOGEN 1.0 01/29/2016 1508   NITRITE NEGATIVE 01/08/2018 1910   LEUKOCYTESUR LARGE (A) 01/08/2018  1910   Sepsis Labs: @LABRCNTIP (procalcitonin:4,lacticidven:4)  )No results found for this or any previous visit (from the past 240 hour(s)).       Radiology Studies: No results found.      Scheduled Meds: . ferrous sulfate  325 mg Oral BID  . leucovorin  26 mg Intravenous Q6H  . methocarbamol  500 mg Oral TID  . predniSONE  10 mg Oral Q breakfast  . traZODone  50 mg Oral QHS   Continuous Infusions: .  sodium bicarbonate  infusion 1000 mL 75 mL/hr at 01/08/18 2212     LOS: 1 day    Time spent: 25 min    Merlene Pulling, PA-S [facility] If 7PM-7AM, please contact night-coverage [email or web address] Password [email or web address] 01/09/2018, 8:14 AM

## 2018-01-10 DIAGNOSIS — D72819 Decreased white blood cell count, unspecified: Secondary | ICD-10-CM

## 2018-01-10 DIAGNOSIS — F3289 Other specified depressive episodes: Secondary | ICD-10-CM

## 2018-01-10 DIAGNOSIS — M3219 Other organ or system involvement in systemic lupus erythematosus: Secondary | ICD-10-CM

## 2018-01-10 DIAGNOSIS — T451X1D Poisoning by antineoplastic and immunosuppressive drugs, accidental (unintentional), subsequent encounter: Secondary | ICD-10-CM

## 2018-01-10 HISTORY — DX: Decreased white blood cell count, unspecified: D72.819

## 2018-01-10 LAB — CBC WITH DIFFERENTIAL/PLATELET
BASOS ABS: 0 10*3/uL (ref 0.0–0.1)
BASOS PCT: 0 %
EOS PCT: 0 %
Eosinophils Absolute: 0 10*3/uL (ref 0.0–0.7)
HCT: 29.3 % — ABNORMAL LOW (ref 36.0–46.0)
Hemoglobin: 9.7 g/dL — ABNORMAL LOW (ref 12.0–15.0)
Lymphocytes Relative: 22 %
Lymphs Abs: 0.3 10*3/uL — ABNORMAL LOW (ref 0.7–4.0)
MCH: 27.8 pg (ref 26.0–34.0)
MCHC: 33.1 g/dL (ref 30.0–36.0)
MCV: 84 fL (ref 78.0–100.0)
MONO ABS: 0 10*3/uL — AB (ref 0.1–1.0)
Monocytes Relative: 1 %
NEUTROS ABS: 1.2 10*3/uL — AB (ref 1.7–7.7)
Neutrophils Relative %: 77 %
Platelets: 160 10*3/uL (ref 150–400)
RBC: 3.49 MIL/uL — AB (ref 3.87–5.11)
RDW: 13.3 % (ref 11.5–15.5)
WBC: 1.5 10*3/uL — ABNORMAL LOW (ref 4.0–10.5)

## 2018-01-10 LAB — BASIC METABOLIC PANEL
ANION GAP: 7 (ref 5–15)
BUN: <5 mg/dL — ABNORMAL LOW (ref 6–20)
CALCIUM: 8.4 mg/dL — AB (ref 8.9–10.3)
CO2: 26 mmol/L (ref 22–32)
Chloride: 105 mmol/L (ref 101–111)
Creatinine, Ser: 0.41 mg/dL — ABNORMAL LOW (ref 0.44–1.00)
GFR calc non Af Amer: >60 mL/min (ref >60)
Glucose, Bld: 104 mg/dL — ABNORMAL HIGH (ref 65–99)
Potassium: 3.6 mmol/L (ref 3.5–5.1)
Sodium: 138 mmol/L (ref 135–145)

## 2018-01-10 LAB — METHOTREXATE: Methotrexate: <0.04

## 2018-01-10 MED ORDER — MAGIC MOUTHWASH W/LIDOCAINE
10.0000 mL | Freq: Four times a day (QID) | ORAL | Status: DC
  Administered 2018-01-10 – 2018-01-11 (×4): 10 mL via ORAL
  Filled 2018-01-10 (×5): qty 10

## 2018-01-10 NOTE — Progress Notes (Signed)
PROGRESS NOTE    [name] [name]  LYY:[id number] DOB: 03-05-1996 DOA: 01/08/2018 PCP: Lucianne Lei, MD   Brief Narrative:  22 year old female who presented with hematuria and oral swelling.  She does have a significant past medical history of systemic lupus erythematosus.  She was recently started on methotrexate injections as an outpatient. She noticed acute development of oral ulcers, oral mucosa swelling and pain, associated with rash in her lower extremities, dysuria and hematuria.  Apparently she injected  herself 25 mg of methotrexate daily for 3 days, she accidentally confused the instructions, she was prescribed 25 mg of methotrexate weekly injections.  On initial physical examination blood pressure 123/88, heart rate 74, respiratory 15, temperature 98.9, oxygen saturation 100%.  She had mucosal swelling, oral ulcerations, her lungs are clear to auscultation, heart S1-S2 present rhythmic,, gallops or murmurs.  Her abdomen was soft nontender, no lower extremity edema, she had discoloration rash/ecchymotic lesions in her feet.  Sodium 139, potassium 3.8, chloride 105, bicarb 22, glucose 71, BUN 12, creatinine 0.56, white count 1.2, hemoglobin 11.2, hematocrit 34.7, platelets 251.  Urinalysis with 100 protein, specific gravity 1.017, red cells greater than 50, white cells greater than 50.  Pregnancy test negative.   Patient was admitted to the hospital with a working diagnosis of methotrexate intoxication due to accidental overdose.       Assessment & Plan:   Principal Problem:   Accidental methotrexate overdose Active Problems:   IDA (iron deficiency anemia)   SLE (systemic lupus erythematosus) (HCC)   Depression   Leukopenia   1.    Accidental methotrexate intoxication. Patient still with complaints of mouth sores and difficulty swallowing due to pain.  Patient status post urine alkalinization with bicarb infusion which has subsequently been discontinued.  Methotrexate level less  than 0.04.  Will discontinue leucovorin.  Continue current hydration.  Continue IV Pepcid, IV analgesics.  Will place on Magic mouthwash with lidocaine.  Supportive care.    2.  Systemic lupus erythematous. Patient follows with [facility], apparently patient's symptoms are related to methotrexate toxicity and not from disease activation, continue close follow up.  Continue current regimen of prednisone.  Outpatient follow-up.    3.  Leukopenia. WBC at 1.5 from 1,4 from 1,2, hb stable at 9.7 with plt at 160.  Follow.  4. Depression.  Stable continue current regimen of trazodone.     DVT prophylaxis:scd Code Status: Full Family Communication: Updated patient. Disposition Plan: When clinically improved and tolerating a solid diet.   Consultants:   Curbside oncology  Procedures:   None  Antimicrobials:   None   Subjective: The pain however easily arousable.  Patient still with complaints of mild pain and swelling with states has not improved significantly since admission.  No shortness of breath.  No chest pain.  Tolerating current clear liquid diet.  Objective: Vitals:   01/09/18 2244 01/10/18 0105 01/10/18 0532 01/10/18 1511  BP: (!) 137/106 (!) 136/100 (!) 130/95 (!) 145/110  Pulse: (!) 110 (!) 115 97 (!) 102  Resp: 18  17 18   Temp: 98.3 F (36.8 C)  99.4 F (37.4 C) 98 F (36.7 C)  TempSrc: Axillary  Axillary Oral  SpO2: 100%  98% 100%  Weight:      Height:        Intake/Output Summary (Last 24 hours) at 01/10/2018 1539 Last data filed at 01/10/2018 0600 Gross per 24 hour  Intake 1620 ml  Output 1800 ml  Net -180 ml   Filed  Weights   01/08/18 1534  Weight: 78 kg (172 lb)    Examination:  General exam: Appears calm and comfortable  Respiratory system: Clear to auscultation. Respiratory effort normal. Cardiovascular system: S1 & S2 heard, RRR. No JVD, murmurs, rubs, gallops or clicks. No pedal edema. Gastrointestinal system: Abdomen is nondistended,  soft and nontender. No organomegaly or masses felt. Normal bowel sounds heard. Central nervous system: Alert and oriented. No focal neurological deficits. Extremities: Symmetric 5 x 5 power. Skin: No rashes, lesions or ulcers Psychiatry: Judgement and insight appear normal. Mood & affect appropriate.     Data Reviewed: I have personally reviewed following labs and imaging studies  CBC: Recent Labs  Lab 01/08/18 1747 01/09/18 0626 01/10/18 0603  WBC 1.2* 1.4* 1.5*  NEUTROABS 0.9*  --  1.2*  HGB 11.2* 10.2* 9.7*  HCT 34.7* 30.5* 29.3*  MCV 85.0 85.0 84.0  PLT 251 213 035   Basic Metabolic Panel: Recent Labs  Lab 01/08/18 1747 01/09/18 0626 01/10/18 0603  NA 139 138 138  K 3.8 3.3* 3.6  CL 105 102 105  CO2 22 26 26   GLUCOSE 71 81 104*  BUN 12 9 <5*  CREATININE 0.56 0.47 0.41*  CALCIUM 8.8* 8.1* 8.4*   GFR: Estimated Creatinine Clearance: 108.1 mL/min (A) (by C-G formula based on SCr of 0.41 mg/dL (L)). Liver Function Tests: Recent Labs  Lab 01/08/18 1747 01/09/18 0626  AST 62* 39  ALT 88* 58*  ALKPHOS 60 50  BILITOT 0.9 0.9  PROT 7.7 6.6  ALBUMIN 3.4* 2.9*   No results for input(s): LIPASE, AMYLASE in the last 168 hours. No results for input(s): AMMONIA in the last 168 hours. Coagulation Profile: No results for input(s): INR, PROTIME in the last 168 hours. Cardiac Enzymes: No results for input(s): CKTOTAL, CKMB, CKMBINDEX, TROPONINI in the last 168 hours. BNP (last 3 results) No results for input(s): PROBNP in the last 8760 hours. HbA1C: No results for input(s): HGBA1C in the last 72 hours. CBG: No results for input(s): GLUCAP in the last 168 hours. Lipid Profile: No results for input(s): CHOL, HDL, LDLCALC, TRIG, CHOLHDL, LDLDIRECT in the last 72 hours. Thyroid Function Tests: No results for input(s): TSH, T4TOTAL, FREET4, T3FREE, THYROIDAB in the last 72 hours. Anemia Panel: No results for input(s): VITAMINB12, FOLATE, FERRITIN, TIBC, IRON,  RETICCTPCT in the last 72 hours. Sepsis Labs: No results for input(s): PROCALCITON, LATICACIDVEN in the last 168 hours.  No results found for this or any previous visit (from the past 240 hour(s)).       Radiology Studies: No results found.      Scheduled Meds: . ferrous sulfate  325 mg Oral BID  . magic mouthwash w/lidocaine  10 mL Oral QID  . methocarbamol  500 mg Oral TID  . predniSONE  10 mg Oral Q breakfast  . traZODone  50 mg Oral QHS   Continuous Infusions: . dextrose 5 % and 0.45 % NaCl with KCl 20 mEq/L 75 mL/hr at 01/10/18 0433  . famotidine (PEPCID) IV Stopped (01/10/18 1051)     LOS: 2 days    Time spent: 35 minutes    Irine Seal, MD [facility] Pager [phone]  If 7PM-7AM, please contact night-coverage [email or web address] Password [email or web address] 01/10/2018, 3:39 PM

## 2018-01-11 DIAGNOSIS — T451X1A Poisoning by antineoplastic and immunosuppressive drugs, accidental (unintentional), initial encounter: Secondary | ICD-10-CM

## 2018-01-11 DIAGNOSIS — K121 Other forms of stomatitis: Secondary | ICD-10-CM

## 2018-01-11 LAB — CBC WITH DIFFERENTIAL/PLATELET
BASOS PCT: 0 %
Basophils Absolute: 0 10*3/uL (ref 0.0–0.1)
EOS PCT: 0 %
Eosinophils Absolute: 0 10*3/uL (ref 0.0–0.7)
HCT: 26.2 % — ABNORMAL LOW (ref 36.0–46.0)
Hemoglobin: 8.5 g/dL — ABNORMAL LOW (ref 12.0–15.0)
Lymphocytes Relative: 34 %
Lymphs Abs: 0.6 10*3/uL (ref 0.7–4.0)
MCH: 27.5 pg (ref 26.0–34.0)
MCHC: 32.4 g/dL (ref 30.0–36.0)
MCV: 84.8 fL (ref 78.0–100.0)
MONO ABS: 0 10*3/uL (ref 0.1–1.0)
MONOS PCT: 2 %
Neutro Abs: 1.2 10*3/uL (ref 1.7–7.7)
Neutrophils Relative %: 64 %
Platelets: 114 10*3/uL — ABNORMAL LOW (ref 150–400)
RBC: 3.09 MIL/uL — ABNORMAL LOW (ref 3.87–5.11)
RDW: 13.1 % (ref 11.5–15.5)
WBC: 1.9 10*3/uL — ABNORMAL LOW (ref 4.0–10.5)

## 2018-01-11 LAB — URINE CULTURE

## 2018-01-11 LAB — MAGNESIUM: MAGNESIUM: 1.5 mg/dL — AB (ref 1.7–2.4)

## 2018-01-11 LAB — BASIC METABOLIC PANEL
Anion gap: 10 (ref 5–15)
CALCIUM: 8.5 mg/dL — AB (ref 8.9–10.3)
CO2: 22 mmol/L (ref 22–32)
CREATININE: 0.56 mg/dL (ref 0.44–1.00)
Chloride: 105 mmol/L (ref 101–111)
GFR calc non Af Amer: >60 mL/min (ref >60)
Glucose, Bld: 103 mg/dL — ABNORMAL HIGH (ref 65–99)
Potassium: 3.4 mmol/L — ABNORMAL LOW (ref 3.5–5.1)
Sodium: 137 mmol/L (ref 135–145)

## 2018-01-11 MED ORDER — MAGIC MOUTHWASH W/LIDOCAINE
10.0000 mL | Freq: Four times a day (QID) | ORAL | Status: DC
  Administered 2018-01-11 – 2018-01-13 (×4): 10 mL via ORAL
  Filled 2018-01-11 (×11): qty 10

## 2018-01-11 MED ORDER — POTASSIUM CHLORIDE 10 MEQ/100ML IV SOLN
10.0000 meq | INTRAVENOUS | Status: AC
  Administered 2018-01-11 (×4): 10 meq via INTRAVENOUS
  Filled 2018-01-11 (×4): qty 100

## 2018-01-11 MED ORDER — DIPHENHYDRAMINE HCL 50 MG/ML IJ SOLN
25.0000 mg | Freq: Three times a day (TID) | INTRAMUSCULAR | Status: DC
  Administered 2018-01-11 – 2018-01-12 (×3): 25 mg via INTRAVENOUS
  Filled 2018-01-11 (×3): qty 1

## 2018-01-11 MED ORDER — MAGNESIUM SULFATE 4 GM/100ML IV SOLN
4.0000 g | Freq: Once | INTRAVENOUS | Status: AC
  Administered 2018-01-11: 4 g via INTRAVENOUS
  Filled 2018-01-11: qty 100

## 2018-01-11 MED ORDER — NYSTATIN 100000 UNIT/ML MT SUSP: 5.0000 mL | Freq: Four times a day (QID) | OROMUCOSAL | Status: DC

## 2018-01-11 MED ORDER — METHOCARBAMOL 1000 MG/10ML IJ SOLN
500.0000 mg | Freq: Three times a day (TID) | INTRAVENOUS | Status: DC
  Administered 2018-01-11 – 2018-01-13 (×7): 500 mg via INTRAVENOUS
  Filled 2018-01-11: qty 550
  Filled 2018-01-11: qty 5
  Filled 2018-01-11: qty 550
  Filled 2018-01-11: qty 5
  Filled 2018-01-11 (×5): qty 550

## 2018-01-11 MED ORDER — NYSTATIN 100000 UNIT/ML MT SUSP
5.0000 mL | Freq: Four times a day (QID) | OROMUCOSAL | Status: DC
  Administered 2018-01-11 – 2018-01-13 (×5): 500000 [IU] via ORAL
  Filled 2018-01-11 (×7): qty 5

## 2018-01-11 MED ORDER — FLUCONAZOLE IN SODIUM CHLORIDE 200-0.9 MG/100ML-% IV SOLN
200.0000 mg | Freq: Once | INTRAVENOUS | Status: AC
  Administered 2018-01-11: 200 mg via INTRAVENOUS
  Filled 2018-01-11: qty 100

## 2018-01-11 MED ORDER — METHYLPREDNISOLONE SODIUM SUCC 125 MG IJ SOLR
60.0000 mg | Freq: Three times a day (TID) | INTRAMUSCULAR | Status: DC
  Administered 2018-01-11 – 2018-01-12 (×3): 60 mg via INTRAVENOUS
  Filled 2018-01-11 (×4): qty 2

## 2018-01-11 MED ORDER — NYSTATIN 100000 UNIT/ML MT SUSP
5.0000 mL | OROMUCOSAL | Status: AC
  Filled 2018-01-11: qty 5

## 2018-01-11 MED ORDER — POTASSIUM CHLORIDE CRYS ER 20 MEQ PO TBCR
40.0000 meq | EXTENDED_RELEASE_TABLET | Freq: Once | ORAL | Status: AC
  Administered 2018-01-11: 40 meq via ORAL
  Filled 2018-01-11: qty 2

## 2018-01-11 MED ORDER — VITAMINS A & D EX OINT
TOPICAL_OINTMENT | CUTANEOUS | Status: AC
  Administered 2018-01-11: 13:00:00
  Filled 2018-01-11: qty 5

## 2018-01-11 NOTE — Progress Notes (Addendum)
PROGRESS NOTE    [name] [name]  WPY:[id number] DOB: 1995-11-08 DOA: 01/08/2018 PCP: Lucianne Lei, MD   Brief Narrative:  22 year old female who presented with hematuria and oral swelling.  She does have a significant past medical history of systemic lupus erythematosus.  She was recently started on methotrexate injections as an outpatient. She noticed acute development of oral ulcers, oral mucosa swelling and pain, associated with rash in her lower extremities, dysuria and hematuria.  Apparently she injected  herself 25 mg of methotrexate daily for 3 days, she accidentally confused the instructions, she was prescribed 25 mg of methotrexate weekly injections.  On initial physical examination blood pressure 123/88, heart rate 74, respiratory 15, temperature 98.9, oxygen saturation 100%.  She had mucosal swelling, oral ulcerations, her lungs are clear to auscultation, heart S1-S2 present rhythmic,, gallops or murmurs.  Her abdomen was soft nontender, no lower extremity edema, she had discoloration rash/ecchymotic lesions in her feet.  Sodium 139, potassium 3.8, chloride 105, bicarb 22, glucose 71, BUN 12, creatinine 0.56, white count 1.2, hemoglobin 11.2, hematocrit 34.7, platelets 251.  Urinalysis with 100 protein, specific gravity 1.017, red cells greater than 50, white cells greater than 50.  Pregnancy test negative.   Patient was admitted to the hospital with a working diagnosis of methotrexate intoxication due to accidental overdose.       Assessment & Plan:   Principal Problem:   Accidental methotrexate overdose Active Problems:   IDA (iron deficiency anemia)   SLE (systemic lupus erythematosus) (HCC)   Depression   Leukopenia   Methotrexate toxicity   Mouth ulcers   1.    Accidental methotrexate intoxication/mucosal ulcers with ulceration/odynophagia Patient still with complaints of mouth sores and difficulty swallowing due to pain.  Patient status post urine alkalinization with  bicarb infusion which has subsequently been discontinued.  Methotrexate level less than 0.04. Leucovorin has been discontinued.  Patient still with complaints of mild sore pains.  Also has noted in oropharynx with some whitish exudate on buccal mucosa and lower lip swelling and bleeding.  Lower lip swelling and bleeding may be secondary to being adjacent to braces.  Due to lip swelling will place on Solu-Medrol 60 mg IV every 8 hours, IV Benadryl every 8 hours, IV analgesics.  Continue Magic mouthwash with lidocaine.  Will add nystatin oral suspension.  IV fluconazole 200 mg x 1.  Continue IV Pepcid.  Supportive care.  Follow.  2.  Systemic lupus erythematous. Patient follows with [facility], apparently patient's symptoms are related to methotrexate toxicity and not from disease activation, continue close follow up.  We will change prednisone to 60 mg IV every 8 hours in light of patient's swelling lip and oral ulcers. Outpatient follow-up.    3.  Leukopenia. WBC improving slowly daily currently at 1.9 from 1.5 from 1,4 from 1,2, hb stable at 8.5 with plt at 114.  Follow.  4. Depression.  No suicidal or homicidal ideations.  Continue trazodone.  5.  Hypokalemia: Replete.     DVT prophylaxis:scd Code Status: Full Family Communication: Updated patient. Disposition Plan: When clinically improved and tolerating a solid diet.   Consultants:   Curbside oncology  Procedures:   None  Antimicrobials:   None   Subjective: Patient in bed with blood noted on lower lip adjacent to braces.  Lower lip swelling.  Patient complained of difficulty swallowing secondary to pain.  Denies any significant improvement since admission.  No chest pain.  No shortness of breath.  Events overnight  noted.   Objective: Vitals:   01/10/18 2218 01/11/18 0500 01/11/18 0608 01/11/18 1447  BP: (!) 137/102  (!) 133/97 (!) 130/98  Pulse: (!) 102  (!) 109 (!) 111  Resp:   17 14  Temp:   98.1 F (36.7 C) (!)  97.5 F (36.4 C)  TempSrc:   Axillary Oral  SpO2: 100%   100%  Weight:  78.2 kg (172 lb 6.4 oz)    Height:        Intake/Output Summary (Last 24 hours) at 01/11/2018 1747 Last data filed at 01/11/2018 1227 Gross per 24 hour  Intake 1150 ml  Output 603 ml  Net 547 ml   Filed Weights   01/08/18 1534 01/10/18 1845 01/11/18 0500  Weight: 78 kg (172 lb) 73.9 kg (163 lb) 78.2 kg (172 lb 6.4 oz)    Examination:  General exam: Towel wrapped around head.  Left lower lip swollen with some bleeding. Oropharynx: Left lower lip swollen and bleeding noted right adjacent to braces.  Oropharynx with some ulcers noted and some whitish exudate. Respiratory system: Lungs clear to auscultation bilaterally.  No wheezes, no crackles, no rhonchi.  Respiratory effort normal. Cardiovascular system: Regular rate and rhythm no murmurs rubs or gallops.  No JVD.  No pedal edema.  Gastrointestinal system: Abdomen is soft, nontender, nondistended, positive bowel sounds.  No rebound.  No guarding.  Central nervous system: Alert and oriented. No focal neurological deficits. Extremities: Symmetric 5 x 5 power. Skin: No rashes, lesions or ulcers Psychiatry: Judgement and insight appear normal. Mood & affect appropriate.     Data Reviewed: I have personally reviewed following labs and imaging studies  CBC: Recent Labs  Lab 01/08/18 1747 01/09/18 0626 01/10/18 0603 01/11/18 0601  WBC 1.2* 1.4* 1.5* 1.9*  NEUTROABS 0.9*  --  1.2* 1.2  HGB 11.2* 10.2* 9.7* 8.5*  HCT 34.7* 30.5* 29.3* 26.2*  MCV 85.0 85.0 84.0 84.8  PLT 251 213 160 144*   Basic Metabolic Panel: Recent Labs  Lab 01/08/18 1747 01/09/18 0626 01/10/18 0603 01/11/18 0601  NA 139 138 138 137  K 3.8 3.3* 3.6 3.4*  CL 105 102 105 105  CO2 22 26 26 22   GLUCOSE 71 81 104* 103*  BUN 12 9 <5* <5*  CREATININE 0.56 0.47 0.41* 0.56  CALCIUM 8.8* 8.1* 8.4* 8.5*  MG  --   --   --  1.5*   GFR: Estimated Creatinine Clearance: 108.3 mL/min (by  C-G formula based on SCr of 0.56 mg/dL). Liver Function Tests: Recent Labs  Lab 01/08/18 1747 01/09/18 0626  AST 62* 39  ALT 88* 58*  ALKPHOS 60 50  BILITOT 0.9 0.9  PROT 7.7 6.6  ALBUMIN 3.4* 2.9*   No results for input(s): LIPASE, AMYLASE in the last 168 hours. No results for input(s): AMMONIA in the last 168 hours. Coagulation Profile: No results for input(s): INR, PROTIME in the last 168 hours. Cardiac Enzymes: No results for input(s): CKTOTAL, CKMB, CKMBINDEX, TROPONINI in the last 168 hours. BNP (last 3 results) No results for input(s): PROBNP in the last 8760 hours. HbA1C: No results for input(s): HGBA1C in the last 72 hours. CBG: No results for input(s): GLUCAP in the last 168 hours. Lipid Profile: No results for input(s): CHOL, HDL, LDLCALC, TRIG, CHOLHDL, LDLDIRECT in the last 72 hours. Thyroid Function Tests: No results for input(s): TSH, T4TOTAL, FREET4, T3FREE, THYROIDAB in the last 72 hours. Anemia Panel: No results for input(s): VITAMINB12, FOLATE, FERRITIN, TIBC, IRON, RETICCTPCT  in the last 72 hours. Sepsis Labs: No results for input(s): PROCALCITON, LATICACIDVEN in the last 168 hours.  Recent Results (from the past 240 hour(s))  Urine Culture     Status: Abnormal   Collection Time: 01/08/18  7:10 PM  Result Value Ref Range Status   Specimen Description   Final    URINE, RANDOM Performed at [facility] [address], [place], [place] [address]    Special Requests   Final    NONE Performed at [facility], [place] [address] [place], [place] [address]    Culture MULTIPLE SPECIES PRESENT, SUGGEST RECOLLECTION (A)  Final   Report Status 01/11/2018 FINAL  Final         Radiology Studies: No results found.      Scheduled Meds: . diphenhydrAMINE  25 mg Intravenous Q8H  . ferrous sulfate  325 mg Oral BID  . magic mouthwash w/lidocaine  10 mL Oral QID   Or  . nystatin  5 mL Oral QID  . methylPREDNISolone  (SOLU-MEDROL) injection  60 mg Intravenous Q8H  . nystatin  5 mL Oral NOW  . traZODone  50 mg Oral QHS   Continuous Infusions: . famotidine (PEPCID) IV Stopped (01/11/18 1103)  . methocarbamol (ROBAXIN)  IV Stopped (01/11/18 1253)  . potassium chloride 10 mEq (01/11/18 1655)     LOS: 3 days    Time spent: 35 minutes    Irine Seal, MD [facility] Pager [phone]  If 7PM-7AM, please contact night-coverage [email or web address] Password [facility] 01/11/2018, 5:47 PM

## 2018-01-11 NOTE — Progress Notes (Signed)
Patient's mouth was slightly bleeding last night, gauze was applied to apply pressure and to keep touching from her dental braces but it bleeds more after the gauze was removed d/t got stuck from the clots , pain med given d/t the pain, ice popsicles given to help the bleeding and appears to have subsided. Will continue to monitor patient.

## 2018-01-11 NOTE — Progress Notes (Deleted)
Patient's mouth was slightly bleeding last night, gauze was applied to apply pressure and to keep touching from her dental braces but it bleeds more after the gauze was removed d/t got stuck from the clots , pain med given d/t the pain, ice popsicles given to help the bleeding and appears to have subsided. Will continue to monitor patient.

## 2018-01-12 DIAGNOSIS — B373 Candidiasis of vulva and vagina: Secondary | ICD-10-CM

## 2018-01-12 DIAGNOSIS — B3731 Acute candidiasis of vulva and vagina: Secondary | ICD-10-CM

## 2018-01-12 DIAGNOSIS — D509 Iron deficiency anemia, unspecified: Secondary | ICD-10-CM

## 2018-01-12 DIAGNOSIS — K1230 Oral mucositis (ulcerative), unspecified: Secondary | ICD-10-CM

## 2018-01-12 HISTORY — DX: Candidiasis of vulva and vagina: B37.3

## 2018-01-12 HISTORY — DX: Oral mucositis (ulcerative), unspecified: K12.30

## 2018-01-12 HISTORY — DX: Acute candidiasis of vulva and vagina: B37.31

## 2018-01-12 LAB — CBC WITH DIFFERENTIAL/PLATELET
Basophils Absolute: 0 10*3/uL (ref 0.0–0.1)
Basophils Relative: 0 %
Eosinophils Absolute: 0 10*3/uL (ref 0.0–0.7)
Eosinophils Relative: 0 %
HEMATOCRIT: 28.6 % — AB (ref 36.0–46.0)
HEMOGLOBIN: 9.2 g/dL — AB (ref 12.0–15.0)
LYMPHS ABS: 0.2 10*3/uL — AB (ref 0.7–4.0)
Lymphocytes Relative: 14 %
MCH: 27.2 pg (ref 26.0–34.0)
MCHC: 32.2 g/dL (ref 30.0–36.0)
MCV: 84.6 fL (ref 78.0–100.0)
MONOS PCT: 1 %
Monocytes Absolute: 0 10*3/uL — ABNORMAL LOW (ref 0.1–1.0)
NEUTROS ABS: 1 10*3/uL — AB (ref 1.7–7.7)
NEUTROS PCT: 85 %
Platelets: 104 10*3/uL — ABNORMAL LOW (ref 150–400)
RBC: 3.38 MIL/uL — ABNORMAL LOW (ref 3.87–5.11)
RDW: 12.9 % (ref 11.5–15.5)
WBC: 1.2 10*3/uL — CL (ref 4.0–10.5)

## 2018-01-12 LAB — BASIC METABOLIC PANEL
Anion gap: 8 (ref 5–15)
BUN: 6 mg/dL (ref 6–20)
CO2: 22 mmol/L (ref 22–32)
CREATININE: 0.52 mg/dL (ref 0.44–1.00)
Calcium: 8.8 mg/dL — ABNORMAL LOW (ref 8.9–10.3)
Chloride: 108 mmol/L (ref 101–111)
GFR calc non Af Amer: >60 mL/min (ref >60)
Glucose, Bld: 116 mg/dL — ABNORMAL HIGH (ref 65–99)
Potassium: 4.7 mmol/L (ref 3.5–5.1)
Sodium: 138 mmol/L (ref 135–145)

## 2018-01-12 LAB — MAGNESIUM: Magnesium: 2.4 mg/dL (ref 1.7–2.4)

## 2018-01-12 MED ORDER — DIPHENHYDRAMINE HCL 50 MG/ML IJ SOLN
25.0000 mg | Freq: Two times a day (BID) | INTRAMUSCULAR | Status: DC
  Filled 2018-01-12: qty 1

## 2018-01-12 MED ORDER — FLUCONAZOLE 150 MG PO TABS
150.0000 mg | ORAL_TABLET | Freq: Once | ORAL | Status: AC
  Administered 2018-01-12: 150 mg via ORAL
  Filled 2018-01-12: qty 1

## 2018-01-12 MED ORDER — FAMOTIDINE 20 MG PO TABS
20.0000 mg | ORAL_TABLET | Freq: Two times a day (BID) | ORAL | Status: DC
  Administered 2018-01-12 – 2018-01-13 (×2): 20 mg via ORAL
  Filled 2018-01-12 (×2): qty 1

## 2018-01-12 MED ORDER — DIPHENHYDRAMINE HCL 25 MG PO CAPS
25.0000 mg | ORAL_CAPSULE | Freq: Three times a day (TID) | ORAL | Status: DC | PRN
  Administered 2018-01-12: 25 mg via ORAL
  Filled 2018-01-12: qty 1

## 2018-01-12 MED ORDER — VITAMINS A & D EX OINT
TOPICAL_OINTMENT | CUTANEOUS | Status: AC
  Administered 2018-01-12: 14:00:00
  Filled 2018-01-12: qty 5

## 2018-01-12 MED ORDER — METHYLPREDNISOLONE SODIUM SUCC 125 MG IJ SOLR
60.0000 mg | Freq: Two times a day (BID) | INTRAMUSCULAR | Status: DC
  Administered 2018-01-12 – 2018-01-13 (×2): 60 mg via INTRAVENOUS
  Filled 2018-01-12 (×2): qty 2

## 2018-01-12 MED ORDER — CLOTRIMAZOLE 1 % VA CREA
1.0000 | TOPICAL_CREAM | Freq: Every day | VAGINAL | Status: DC
  Administered 2018-01-12: 1 via VAGINAL
  Filled 2018-01-12: qty 45

## 2018-01-12 NOTE — Plan of Care (Signed)
  Problem: Nutrition: Goal: Adequate nutrition will be maintained Outcome: Progressing   

## 2018-01-12 NOTE — Progress Notes (Signed)
IV site left forearm leaked when flushed. Requesting IV site be D/C and meds be PO since going home 01/13/18

## 2018-01-12 NOTE — Progress Notes (Signed)
PROGRESS NOTE    [name] [name]  FFM:[id number] DOB: 1996-04-10 DOA: 01/08/2018 PCP: [name] Lei, MD   Brief Narrative:  22 year old female who presented with hematuria and oral swelling.  She does have a significant past medical history of systemic lupus erythematosus.  She was recently started on methotrexate injections as an outpatient. She noticed acute development of oral ulcers, oral mucosa swelling and pain, associated with rash in her lower extremities, dysuria and hematuria.  Apparently she injected  herself 25 mg of methotrexate daily for 3 days, she accidentally confused the instructions, she was prescribed 25 mg of methotrexate weekly injections.  On initial physical examination blood pressure 123/88, heart rate 74, respiratory 15, temperature 98.9, oxygen saturation 100%.  She had mucosal swelling, oral ulcerations, her lungs are clear to auscultation, heart S1-S2 present rhythmic,, gallops or murmurs.  Her abdomen was soft nontender, no lower extremity edema, she had discoloration rash/ecchymotic lesions in her feet.  Sodium 139, potassium 3.8, chloride 105, bicarb 22, glucose 71, BUN 12, creatinine 0.56, white count 1.2, hemoglobin 11.2, hematocrit 34.7, platelets 251.  Urinalysis with 100 protein, specific gravity 1.017, red cells greater than 50, white cells greater than 50.  Pregnancy test negative.   Patient was admitted to the hospital with a working diagnosis of methotrexate intoxication due to accidental overdose.       Assessment & Plan:   Principal Problem:   Accidental methotrexate overdose Active Problems:   Mucositis oral   IDA (iron deficiency anemia)   SLE (systemic lupus erythematosus) (HCC)   Depression   Leukopenia   Methotrexate toxicity   Mouth ulcers   1.    Accidental methotrexate intoxication/mucosal ulcers with ulceration/odynophagia/mucositis Patient still with complaints of mouth sores and difficulty swallowing due to pain on admission and  during the hospitalization.  Patient status post urine alkalinization with bicarb infusion which has subsequently been discontinued.  Methotrexate level less than 0.04. Leucovorin has been discontinued.  Patient with improvement with lip swelling, oral ulcers, lip bleeding from 01/11/2018.  Patient was noted to have some whitish exudates on vehicle mucosa.  Clinically improved with increased dose of IV Solu-Medrol, IV Benadryl, IV Pepcid, nystatin.  Decrease Solu-Medrol to 60 mg IV every 12 hours.  Decrease IV Benadryl to 25 mg IV every 12 hours.  Continue IV Pepcid.  Status post a dose of IV fluconazole 200 mg x 1.  Continue nystatin oral suspension, Magic mouthwash with lidocaine.  Supportive care.   2.  Systemic lupus erythematous. Patient follows with [facility], apparently patient's symptoms are related to methotrexate toxicity and not from disease activation, continue close follow up.  Oral prednisone was changed to Solu-Medrol 60 mg IV every 8 hours in light of worsening lip swelling and oral ulcers noted on 01/11/2018.  Clinical improvement with mucositis and as such we will taper Solu-Medrol back down to 60 mg IV every 12 hours and then daily tomorrow if continued improvement.  Outpatient follow-up.   3.  Leukopenia. WBC was initially improving however trended back down currently at 1.2 from 1.9 from 1.5.  Hemoglobin is stable at 9.2.  Platelet count at 104.  Monitor closely.  Follow.  4. Depression.  Stable.  No suicidal homicidal ideations.  Continue trazodone.  5.  Hypokalemia/hypomagnesemia: Repleted.   /  DVT prophylaxis:scd Code Status: Full Family Communication: Updated patient. Disposition Plan: When clinically improved and tolerating a solid diet hopefully in the next 24 to 48 hours..   Consultants:   Curbside oncology  Procedures:   None  Antimicrobials:   None   Subjective: Patient in bed feels much better today than she did yesterday.  Lower lip swelling and  bleeding improved.  No chest pain.  No shortness of breath.  Currently starting full liquid diet.   Objective: Vitals:   01/11/18 0608 01/11/18 1447 01/11/18 2149 01/12/18 0441  BP: (!) 133/97 (!) 130/98 (!) 146/105 135/90  Pulse: (!) 109 (!) 111 (!) 105 (!) 105  Resp: 17 14 18 12   Temp: 98.1 F (36.7 C) (!) 97.5 F (36.4 C) 98.6 F (37 C) 97.9 F (36.6 C)  TempSrc: Axillary Oral Oral Oral  SpO2:  100% 100% 97%  Weight:    71.1 kg (156 lb 12 oz)  Height:        Intake/Output Summary (Last 24 hours) at 01/12/2018 1030 Last data filed at 01/12/2018 0900 Gross per 24 hour  Intake 2680 ml  Output 2626 ml  Net 54 ml   Filed Weights   01/10/18 1845 01/11/18 0500 01/12/18 0441  Weight: 73.9 kg (163 lb) 78.2 kg (172 lb 6.4 oz) 71.1 kg (156 lb 12 oz)    Examination:  General exam: NAD.  Oropharynx: Left lower lip less swollen and minimal bleeding.  Some whitish exudates in buccal mucosa and ulcers improving.   Respiratory system: CTAB.  No crackles, no rhonchi, no wheezing.  Respiratory effort normal. Cardiovascular system: RRR no murmurs rubs or gallops. No JVD.  No pedal edema.  Gastrointestinal system: Abdomen is nontender, nondistended, soft, positive bowel sounds.  No rebound.  No guarding.  Central nervous system: Alert and oriented. No focal neurological deficits. Extremities: Symmetric 5 x 5 power. Skin: No rashes, lesions or ulcers Psychiatry: Judgement and insight appear normal. Mood & affect appropriate.     Data Reviewed: I have personally reviewed following labs and imaging studies  CBC: Recent Labs  Lab 01/08/18 1747 01/09/18 0626 01/10/18 0603 01/11/18 0601 01/12/18 0553  WBC 1.2* 1.4* 1.5* 1.9* 1.2*  NEUTROABS 0.9*  --  1.2* 1.2 1.0*  HGB 11.2* 10.2* 9.7* 8.5* 9.2*  HCT 34.7* 30.5* 29.3* 26.2* 28.6*  MCV 85.0 85.0 84.0 84.8 84.6  PLT 251 213 160 114* 614*   Basic Metabolic Panel: Recent Labs  Lab 01/08/18 1747 01/09/18 0626 01/10/18 0603  01/11/18 0601 01/12/18 0553  NA 139 138 138 137 138  K 3.8 3.3* 3.6 3.4* 4.7  CL 105 102 105 105 108  CO2 22 26 26 22 22   GLUCOSE 71 81 104* 103* 116*  BUN 12 9 <5* <5* 6  CREATININE 0.56 0.47 0.41* 0.56 0.52  CALCIUM 8.8* 8.1* 8.4* 8.5* 8.8*  MG  --   --   --  1.5* 2.4   GFR: Estimated Creatinine Clearance: 103.4 mL/min (by C-G formula based on SCr of 0.52 mg/dL). Liver Function Tests: Recent Labs  Lab 01/08/18 1747 01/09/18 0626  AST 62* 39  ALT 88* 58*  ALKPHOS 60 50  BILITOT 0.9 0.9  PROT 7.7 6.6  ALBUMIN 3.4* 2.9*   No results for input(s): LIPASE, AMYLASE in the last 168 hours. No results for input(s): AMMONIA in the last 168 hours. Coagulation Profile: No results for input(s): INR, PROTIME in the last 168 hours. Cardiac Enzymes: No results for input(s): CKTOTAL, CKMB, CKMBINDEX, TROPONINI in the last 168 hours. BNP (last 3 results) No results for input(s): PROBNP in the last 8760 hours. HbA1C: No results for input(s): HGBA1C in the last 72 hours. CBG: No results for  input(s): GLUCAP in the last 168 hours. Lipid Profile: No results for input(s): CHOL, HDL, LDLCALC, TRIG, CHOLHDL, LDLDIRECT in the last 72 hours. Thyroid Function Tests: No results for input(s): TSH, T4TOTAL, FREET4, T3FREE, THYROIDAB in the last 72 hours. Anemia Panel: No results for input(s): VITAMINB12, FOLATE, FERRITIN, TIBC, IRON, RETICCTPCT in the last 72 hours. Sepsis Labs: No results for input(s): PROCALCITON, LATICACIDVEN in the last 168 hours.  Recent Results (from the past 240 hour(s))  Urine Culture     Status: Abnormal   Collection Time: 01/08/18  7:10 PM  Result Value Ref Range Status   Specimen Description   Final    URINE, RANDOM Performed at [facility] [address], [place], [place] [address]    Special Requests   Final    NONE Performed at [facility], [place] [address], [place], [place] [address]    Culture MULTIPLE SPECIES  PRESENT, SUGGEST RECOLLECTION (A)  Final   Report Status 01/11/2018 FINAL  Final         Radiology Studies: No results found.      Scheduled Meds: . diphenhydrAMINE  25 mg Intravenous Q12H  . ferrous sulfate  325 mg Oral BID  . magic mouthwash w/lidocaine  10 mL Oral QID   Or  . nystatin  5 mL Oral QID  . methylPREDNISolone (SOLU-MEDROL) injection  60 mg Intravenous Q12H  . traZODone  50 mg Oral QHS   Continuous Infusions: . famotidine (PEPCID) IV 20 mg (01/12/18 0943)  . methocarbamol (ROBAXIN)  IV Stopped (01/12/18 0447)     LOS: 4 days    Time spent: 35 minutes    Irine Seal, MD [facility] Pager [phone]  If 7PM-7AM, please contact night-coverage [email or web address] Password [email or web address] 01/12/2018, 10:30 AM

## 2018-01-12 NOTE — Progress Notes (Signed)
Received Critical value from lab with WBC of 1.2. MD on call informed about the result. Waiting for call back if any further order. Will informed Day shift nurse.

## 2018-01-13 DIAGNOSIS — B373 Candidiasis of vulva and vagina: Secondary | ICD-10-CM

## 2018-01-13 LAB — CBC WITH DIFFERENTIAL/PLATELET
BASOS ABS: 0 10*3/uL (ref 0.0–0.1)
BASOS PCT: 0 %
EOS ABS: 0 10*3/uL (ref 0.0–0.7)
EOS PCT: 0 %
HEMATOCRIT: 27.2 % — AB (ref 36.0–46.0)
Hemoglobin: 9 g/dL — ABNORMAL LOW (ref 12.0–15.0)
Lymphocytes Relative: 15 %
Lymphs Abs: 0.4 10*3/uL — ABNORMAL LOW (ref 0.7–4.0)
MCH: 27.9 pg (ref 26.0–34.0)
MCHC: 33.1 g/dL (ref 30.0–36.0)
MCV: 84.2 fL (ref 78.0–100.0)
MONO ABS: 0.1 10*3/uL (ref 0.1–1.0)
Monocytes Relative: 3 %
NEUTROS ABS: 2 10*3/uL (ref 1.7–7.7)
NEUTROS PCT: 82 %
PLATELETS: 94 10*3/uL — AB (ref 150–400)
RBC: 3.23 MIL/uL — ABNORMAL LOW (ref 3.87–5.11)
RDW: 13.3 % (ref 11.5–15.5)
WBC: 2.4 10*3/uL — AB (ref 4.0–10.5)

## 2018-01-13 LAB — BASIC METABOLIC PANEL
ANION GAP: 9 (ref 5–15)
BUN: 10 mg/dL (ref 6–20)
CALCIUM: 9.5 mg/dL (ref 8.9–10.3)
CHLORIDE: 106 mmol/L (ref 101–111)
CO2: 22 mmol/L (ref 22–32)
Creatinine, Ser: 0.56 mg/dL (ref 0.44–1.00)
GFR calc non Af Amer: >60 mL/min (ref >60)
Glucose, Bld: 122 mg/dL — ABNORMAL HIGH (ref 65–99)
Potassium: 4.3 mmol/L (ref 3.5–5.1)
SODIUM: 137 mmol/L (ref 135–145)

## 2018-01-13 MED ORDER — PREDNISONE 10 MG PO TABS: 10.0000 mg | ORAL_TABLET | Freq: Every day | ORAL | 0 refills | Status: DC

## 2018-01-13 MED ORDER — CLOTRIMAZOLE 1 % VA CREA: 1.0000 | TOPICAL_CREAM | Freq: Every day | VAGINAL | 0 refills | Status: AC

## 2018-01-13 MED ORDER — DIPHENHYDRAMINE HCL 25 MG PO TABS: 25.0000 mg | ORAL_TABLET | Freq: Every day | ORAL | 0 refills | Status: DC

## 2018-01-13 MED ORDER — NYSTATIN 100000 UNIT/ML MT SUSP: 5.0000 mL | Freq: Four times a day (QID) | OROMUCOSAL | 0 refills | Status: AC

## 2018-01-13 MED ORDER — FAMOTIDINE 20 MG PO TABS: 20.0000 mg | ORAL_TABLET | Freq: Two times a day (BID) | ORAL | 0 refills | Status: DC

## 2018-01-13 MED ORDER — MAGIC MOUTHWASH W/LIDOCAINE: 10.0000 mL | Freq: Four times a day (QID) | ORAL | 0 refills | Status: AC

## 2018-01-13 MED ORDER — ACETAMINOPHEN 325 MG PO TABS: 650.0000 mg | ORAL_TABLET | Freq: Four times a day (QID) | ORAL | Status: DC | PRN

## 2018-01-13 NOTE — Plan of Care (Signed)
Plan of care discussed.   

## 2018-01-13 NOTE — Discharge Summary (Signed)
Physician Discharge Summary  [name] [name] MWN:[id number] DOB: Jul 16, 1996 DOA: 01/08/2018  PCP: Lucianne Lei, MD  Admit date: 01/08/2018 Discharge date: 01/13/2018  Time spent: 60 minutes  Recommendations for Outpatient Follow-up:  1.  follow-up with Dr. Marjory Lies, rheumatology in 1 week.   2. Follow-up with Lucianne Lei, MD in 2 weeks.  On follow-up patient will need a CBC with differential to follow-up on counts.  Patient also need a basic metabolic profile as well as a magnesium level to follow-up on electrolytes and renal function.    Discharge Diagnoses:  Principal Problem:   Accidental methotrexate overdose Active Problems:   Mucositis oral   IDA (iron deficiency anemia)   SLE (systemic lupus erythematosus) (HCC)   Depression   Leukopenia   Methotrexate toxicity   Mouth ulcers   Vaginal candidiasis   Discharge Condition: stable and improved  Diet recommendation: soft diet  Filed Weights   01/11/18 0500 01/12/18 0441 01/13/18 0500  Weight: 78.2 kg (172 lb 6.4 oz) 71.1 kg (156 lb 12 oz) 71.2 kg (156 lb 15.5 oz)    History of present illness:  Per Dr [place] is a 22 y.o. female with medical history significant of SLE.  Patient recently restarted on methotrexate therapy these past few days.  Presented to ED with oral swelling and hematuria.  She reported that she accidentally miss-read the prescription and was injecting herself with 20m MTX daily instead of weekly for the past 3 days (Friday, Sat, Sunday).  She developed oral ulcerations, pain with swallowing, hematuria and dysuria, rash on feet.  ED Course: Poison control contacted: plan from toxicologist was leucovorin 248mIV every 6 hours for 72 hours, recommended alkalanization of urine x 12 hours, sent methotrexate level    Hospital Course:  1.  Accidental methotrexate intoxication/mucosal ulcers with ulceration/odynophagia/mucositis/probable oral thrush Patient was admitted with an accidental  methotrexate intoxication with noted mucosal ulcers/ulceration, mucositis/odynophagia and probable oral thrush.  Patient was initially placed on Chloraseptic spray.  Patient's urine alkalinized with a bicarb drip which was subsequently tapered and discontinued.  Patient was also placed on leucovorin per poison control recommendations x72 hours methotrexate levels were obtained.  Methotrexate levels came back at 0.04, patient initially improved and leucovorin was discontinued.  During the hospitalization patient was noted to have significant lip swelling with lip bleeding, noted oral ulcers with a whitish exudate.  Patient had worsening bleeding 01/11/2018.  Patient was subsequently placed on IV Solu-Medrol 60 mg every 8 hours, IV Pepcid twice daily, IV Benadryl 3 times daily and those doses tapered down.  Patient was subsequently given nystatin swish and swallow, placed on Magic mouthwash with lidocaine and given IV dose of fluconazole 200 mg x 1.  Patient was also subsequently started on clear liquid diet.  Patient improved clinically diet was advanced and patient tolerated a full liquid diet.  Patient was subsequently advanced to a soft diet which he tolerated.  IV Solu-Medrol subsequently tapered down to oral prednisone and patient be discharged on a prednisone taper, Benadryl taper, Pepcid taper.  Patient improved clinically and methotrexate was discontinued during the hospitalization and patient will follow up with her rheumatologist in the outpatient setting.  Patient will be discharged in stable and improved condition.   2.[facility]ic lupus erythematous. [facility] related to [facility]ate toxicity and not from disease activation.  Oral prednisone was changed to Solu-Medrol 60 mg IV every 8 hours in light of worsening lip swelling  and oral ulcers noted on 01/11/2018.    Patient improved clinically in terms of her mucositis/probable oral thrush.  IV  Solu-Medrol subsequently tapered down and patient transition to oral prednisone taper.  Patient will be discharged on oral prednisone taper back to home regimen of prednisone 10 mg daily.  Patient is to follow-up with her rheumatologist in the outpatient setting.  3.Leukopenia.WBC was initially improving however trended back down and then trended back up.  WBC on day of discharge was 2.4 from 1.2 from 1.9 from 1.5.  Hemoglobin stabilized at 9.0.  Platelet count was 94 by day of discharge.  Outpatient follow-up.   4. Depression.    Remained stable.  Patient denied any suicidal ideations.  Patient was maintained on home regimen of trazodone.   5.  Hypokalemia/hypomagnesemia: Repleted  6.  Vaginal candidiasis During the hospitalization the day prior to discharge patient had complaints of vaginal candidiasis.  Patient was given Diflucan 150 mg p.o. x1 and subsequently placed on clotrimazole vaginal cream x7 days.  Outpatient follow-up with PCP.    Procedures:  None  Consultations:  Curbside oncology      Discharge Exam: Vitals:   01/13/18 0743 01/13/18 1351  BP: (!) 148/98 (!) 142/102  Pulse:  90  Resp:  18  Temp:  98.7 F (37.1 C)  SpO2:  100%    General: NAD Cardiovascular: RRR Respiratory: CTAB  Discharge Instructions   Discharge Instructions    Diet general   Complete by:  As directed    Increase activity slowly   Complete by:  As directed      Allergies as of 01/13/2018      Reactions   Hydroxychloroquine Rash   Latex Rash      Medication List    STOP taking these medications   methotrexate 50 MG/2ML injection   Tuberculin-Allergy Syringes 27G X 1/2" 1 ML Kit     TAKE these medications   acetaminophen 325 MG tablet Commonly known as:  TYLENOL Take 2 tablets (650 mg total) by mouth every 6 (six) hours as needed for mild pain (or Fever >/= 101).   BENADRYL ALLERGY PO Take by mouth as needed. What changed:  Another medication with the same name  was added. Make sure you understand how and when to take each.   diphenhydrAMINE 25 MG tablet Commonly known as:  BENADRYL ALLERGY Take 1 tablet (25 mg total) by mouth daily. Take 1 tablet 2 times daily x 2 days, then 1 tablet daily x 3 days then stop. What changed:  You were already taking a medication with the same name, and this prescription was added. Make sure you understand how and when to take each.   clotrimazole 1 % vaginal cream Commonly known as:  GYNE-LOTRIMIN Place 1 Applicatorful vaginally at bedtime for 7 days.   EYE DROPS OP Place 2 drops into both eyes daily as needed (dry eyes).   famotidine 20 MG tablet Commonly known as:  PEPCID Take 1 tablet (20 mg total) by mouth 2 (two) times daily. Take 1 tablet 2 times daily x 3 days, then 1 tablet daily x 3 days then stop.   ferrous sulfate 325 (65 FE) MG tablet Take 1 tablet (325 mg total) by mouth 2 (two) times daily. With 1/2 glass of orange juice   folic acid 1 MG tablet Commonly known as:  FOLVITE TAKE 2 TABLETS (2 MG TOTAL) BY MOUTH DAILY.   magic mouthwash w/lidocaine Soln Take 10 mLs by mouth 4 (  four) times daily for 7 days.   methocarbamol 500 MG tablet Commonly known as:  ROBAXIN Take 1 tablet (500 mg total) by mouth 3 (three) times daily.   nystatin 100000 UNIT/ML suspension Commonly known as:  MYCOSTATIN Take 5 mLs (500,000 Units total) by mouth 4 (four) times daily for 7 days.   predniSONE 10 MG tablet Commonly known as:  DELTASONE Take 1-6 tablets (10-60 mg total) by mouth daily with breakfast. TAKE 6 TABLETS (60MG) 2 TIMES DAILY X 2 DAYS, THEN 6 TABLETS (60-MG) DAILY X 3 DAYS, THEN 4 TABLETS (40MG) X 3 DAYS, THEN 2 TABLETS (20MG) X 3 DAYS, THEN BACK TO HOME DOSE OF 10MG DAILY. What changed:    medication strength  how much to take  additional instructions   traZODone 50 MG tablet Commonly known as:  DESYREL Take 50 mg by mouth at bedtime.   Vitamin D (Ergocalciferol) 50000 units Caps  capsule Commonly known as:  DRISDOL Take 1 capsule (50,000 Units total) by mouth 2 (two) times a week.      Allergies  Allergen Reactions  . Hydroxychloroquine Rash  . Latex Rash   Follow-up Information    Lucianne Lei, MD. Schedule an appointment as soon as possible for a visit in 2 week(s).   Specialty:  Family Medicine Contact information: [address] [place] [address] [phone]        Bo Merino, MD. Schedule an appointment as soon as possible for a visit in 1 week(s).   Specialty:  Rheumatology Contact information: [place] [address] [phone]            The results of significant diagnostics from this hospitalization (including imaging, microbiology, ancillary and laboratory) are listed below for reference.    Significant Diagnostic Studies: No results found.  Microbiology: Recent Results (from the past 240 hour(s))  Urine Culture     Status: Abnormal   Collection Time: 01/08/18  7:10 PM  Result Value Ref Range Status   Specimen Description   Final    URINE, RANDOM Performed at [facility] [address], [place], [place] [address]    Special Requests   Final    NONE Performed at [facility], [facility] [address] [place], [place] [address]    Culture MULTIPLE SPECIES PRESENT, SUGGEST RECOLLECTION (A)  Final   Report Status 01/11/2018 FINAL  Final     Labs: Basic Metabolic Panel: Recent Labs  Lab 01/09/18 0626 01/10/18 0603 01/11/18 0601 01/12/18 0553 01/13/18 0534  NA 138 138 137 138 137  K 3.3* 3.6 3.4* 4.7 4.3  CL 102 105 105 108 106  CO2 26 26 22 22 22   GLUCOSE 81 104* 103* 116* 122*  BUN 9 <5* <5* 6 10  CREATININE 0.47 0.41* 0.56 0.52 0.56  CALCIUM 8.1* 8.4* 8.5* 8.8* 9.5  MG  --   --  1.5* 2.4  --    Liver Function Tests: Recent Labs  Lab 01/08/18 1747 01/09/18 0626  AST 62* 39  ALT 88* 58*  ALKPHOS 60 50  BILITOT 0.9 0.9  PROT 7.7  6.6  ALBUMIN 3.4* 2.9*   No results for input(s): LIPASE, AMYLASE in the last 168 hours. No results for input(s): AMMONIA in the last 168 hours. CBC: Recent Labs  Lab 01/08/18 1747 01/09/18 0626 01/10/18 0603 01/11/18 0601 01/12/18 0553 01/13/18 0534  WBC 1.2* 1.4* 1.5* 1.9* 1.2* 2.4*  NEUTROABS 0.9*  --  1.2* 1.2  1.0* 2.0  HGB 11.2* 10.2* 9.7* 8.5* 9.2* 9.0*  HCT 34.7* 30.5* 29.3* 26.2* 28.6* 27.2*  MCV 85.0 85.0 84.0 84.8 84.6 84.2  PLT 251 213 160 114* 104* 94*   Cardiac Enzymes: No results for input(s): CKTOTAL, CKMB, CKMBINDEX, TROPONINI in the last 168 hours. BNP: BNP (last 3 results) No results for input(s): BNP in the last 8760 hours.  ProBNP (last 3 results) No results for input(s): PROBNP in the last 8760 hours.  CBG: No results for input(s): GLUCAP in the last 168 hours.     Signed:  Irine Seal MD.  [facility] 01/13/2018, 4:01 PM

## 2018-01-13 NOTE — Progress Notes (Signed)
Discharge instruction reviewed with patient using teach back method no questions at this time.

## 2018-01-15 ENCOUNTER — Telehealth (INDEPENDENT_AMBULATORY_CARE_PROVIDER_SITE_OTHER): Payer: Self-pay | Admitting: Rheumatology

## 2018-01-15 NOTE — Telephone Encounter (Signed)
The discharge letter that was mailed certified was returned to the office unsigned for. I mailed to patient regular [place] postal service per protocol

## 2018-02-01 ENCOUNTER — Encounter (HOSPITAL_COMMUNITY): Payer: Self-pay | Admitting: Emergency Medicine

## 2018-02-01 ENCOUNTER — Emergency Department (HOSPITAL_COMMUNITY)
Admission: EM | Admit: 2018-02-01 | Discharge: 2018-02-01 | Disposition: A | Discharge status: Home / Self Care | Payer: BLUE CROSS/BLUE SHIELD | Attending: Emergency Medicine | Admitting: Emergency Medicine

## 2018-02-01 DIAGNOSIS — M329 Systemic lupus erythematosus, unspecified: Secondary | ICD-10-CM | POA: Insufficient documentation

## 2018-02-01 DIAGNOSIS — Z91040 Latex allergy status: Secondary | ICD-10-CM | POA: Insufficient documentation

## 2018-02-01 DIAGNOSIS — Z79899 Other long term (current) drug therapy: Secondary | ICD-10-CM | POA: Insufficient documentation

## 2018-02-01 LAB — COMPREHENSIVE METABOLIC PANEL
ALT: 20 U/L (ref 14–54)
ANION GAP: 7 (ref 5–15)
AST: 24 U/L (ref 15–41)
Albumin: 2.8 g/dL — ABNORMAL LOW (ref 3.5–5.0)
Alkaline Phosphatase: 82 U/L (ref 38–126)
BUN: 5 mg/dL — ABNORMAL LOW (ref 6–20)
CHLORIDE: 107 mmol/L (ref 101–111)
CO2: 27 mmol/L (ref 22–32)
Calcium: 9.1 mg/dL (ref 8.9–10.3)
Creatinine, Ser: 0.55 mg/dL (ref 0.44–1.00)
GFR calc non Af Amer: >60 mL/min (ref >60)
Glucose, Bld: 122 mg/dL — ABNORMAL HIGH (ref 65–99)
Potassium: 3.7 mmol/L (ref 3.5–5.1)
Sodium: 141 mmol/L (ref 135–145)
Total Bilirubin: 0.6 mg/dL (ref 0.3–1.2)
Total Protein: 6.7 g/dL (ref 6.5–8.1)

## 2018-02-01 LAB — CBC
HCT: 30.6 % — ABNORMAL LOW (ref 36.0–46.0)
Hemoglobin: 9.5 g/dL — ABNORMAL LOW (ref 12.0–15.0)
MCH: 27.5 pg (ref 26.0–34.0)
MCHC: 31 g/dL (ref 30.0–36.0)
MCV: 88.7 fL (ref 78.0–100.0)
PLATELETS: 348 10*3/uL (ref 150–400)
RBC: 3.45 MIL/uL — ABNORMAL LOW (ref 3.87–5.11)
RDW: 16.8 % — AB (ref 11.5–15.5)
WBC: 18.1 10*3/uL — ABNORMAL HIGH (ref 4.0–10.5)

## 2018-02-01 MED ORDER — MORPHINE SULFATE (PF) 4 MG/ML IV SOLN: 2.0000 mg | Freq: Once | INTRAVENOUS | Status: DC

## 2018-02-01 MED ORDER — METHYLPREDNISOLONE SODIUM SUCC 125 MG IJ SOLR
60.0000 mg | Freq: Once | INTRAMUSCULAR | Status: AC
  Administered 2018-02-01: 60 mg via INTRAVENOUS
  Filled 2018-02-01: qty 2

## 2018-02-01 MED ORDER — KETOROLAC TROMETHAMINE 30 MG/ML IJ SOLN
15.0000 mg | Freq: Once | INTRAMUSCULAR | Status: AC
  Administered 2018-02-01: 15 mg via INTRAMUSCULAR
  Filled 2018-02-01: qty 1

## 2018-02-01 MED ORDER — PREDNISONE 10 MG (21) PO TBPK: ORAL_TABLET | Freq: Every day | ORAL | 0 refills | Status: DC

## 2018-02-01 NOTE — ED Notes (Addendum)
Patient reports that she has lupus-> diagnosed 3 years ago. "this is the first time the fluid came back to my leg since diagnosis"  Patient reports that she has been taking fluid pills X 1 week "and the right leg went down a little"

## 2018-02-01 NOTE — Discharge Instructions (Addendum)
Begin taking steroids as directed tomorrow.

## 2018-02-01 NOTE — ED Triage Notes (Signed)
Patient to ED c/o pain and "fluid build-up" in R leg and L ankle x 1 week. Reports hx of same and was given a shot for lupus flare that helped. Patient states she's ben taking fluid pills from [facility] (Diurex) without relief.

## 2018-02-01 NOTE — ED Notes (Signed)
Patient reports that 3 years ago when this happened, she was given "a shot on my leg and the fluid went away"

## 2018-02-01 NOTE — ED Provider Notes (Signed)
[facility]PART[facility] Provider Note   CSN: [id number] Arrival date & time: 02/01/18  1025     History   Chief Complaint Chief Complaint  Patient presents with  . Leg Pain    HPI [name] [name] is a 22 y.o. female with a past medical history of lupus, who presents to ED for evaluation of 1 week history of lower extremity edema.  She states this feels similar to the " fluid that builds up in my legs," when she was first diagnosed with lupus.  She was given steroids at that time which helped improve the symptoms.  She has taken an over-the-counter fluid pill (Diurex) with only mild improvement in her symptoms.  She denies any injuries or falls, numbness in leg, prior DVT or PE, shortness of breath, chest pain, joint pain.  HPI  Past Medical History:  Diagnosis Date  . Lupus ([place])   . Raynaud disease     Patient Active Problem List   Diagnosis Date Noted  . Mucositis oral 01/12/2018  . Vaginal candidiasis 01/12/2018  . Methotrexate toxicity   . Mouth ulcers   . Leukopenia 01/10/2018  . Accidental methotrexate overdose 01/08/2018  . Depression 02/08/2017  . Primary osteoarthritis of both knees 11/09/2016  . Chronic midline low back pain without sciatica 11/09/2016  . Vitamin D deficiency 11/09/2016  . SLE (systemic lupus erythematosus) ([place]) 09/19/2016  . High risk medication use 09/19/2016  . Insomnia 01/29/2016  . Depo-Provera contraceptive status 08/11/2015  . Raynaud's disease 08/11/2015  . Seasonal allergies 08/11/2015  . IDA (iron deficiency anemia) 08/11/2015    Past Surgical History:  Procedure Laterality Date  . FRACTURE SURGERY    . TONSILLECTOMY       OB History   None      Home Medications    Prior to Admission medications   Medication Sig Start Date End Date Taking? Authorizing Provider  acetaminophen (TYLENOL) 325 MG tablet Take 2 tablets (650 mg total) by mouth every 6 (six) hours as needed for mild pain (or Fever  >/= 101). 01/13/18   Eugenie Filler, MD  Carboxymethylcellulose Sodium (EYE DROPS OP) Place 2 drops into both eyes daily as needed (dry eyes).    [provider]  diphenhydrAMINE (BENADRYL ALLERGY) 25 MG tablet Take 1 tablet (25 mg total) by mouth daily. Take 1 tablet 2 times daily x 2 days, then 1 tablet daily x 3 days then stop. 01/13/18   Eugenie Filler, MD  diphenhydrAMINE HCl (BENADRYL ALLERGY PO) Take by mouth as needed.    [provider]  famotidine (PEPCID) 20 MG tablet Take 1 tablet (20 mg total) by mouth 2 (two) times daily. Take 1 tablet 2 times daily x 3 days, then 1 tablet daily x 3 days then stop. 01/13/18   Eugenie Filler, MD  ferrous sulfate 325 (65 FE) MG tablet Take 1 tablet (325 mg total) by mouth 2 (two) times daily. With 1/2 glass of orange juice 12/15/15   Davonna Belling, MD  folic acid (FOLVITE) 1 MG tablet TAKE 2 TABLETS (2 MG TOTAL) BY MOUTH DAILY. 12/11/17   Bo Merino, MD  methocarbamol (ROBAXIN) 500 MG tablet Take 1 tablet (500 mg total) by mouth 3 (three) times daily. 12/19/17   Bo Merino, MD  predniSONE (STERAPRED UNI-PAK 21 TAB) 10 MG (21) TBPK tablet Take by mouth daily. Take 6 tabs by mouth daily  for 2 days, then 5 tabs for 2 days, then 4  tabs for 2 days, then 3 tabs for 2 days, 2 tabs for 2 days, then 1 tab by mouth daily for 2 days 02/01/18   Delia Heady, PA-C  traZODone (DESYREL) 50 MG tablet Take 50 mg by mouth at bedtime. 02/14/17   [provider]  Vitamin D, Ergocalciferol, (DRISDOL) 50000 units CAPS capsule Take 1 capsule (50,000 Units total) by mouth 2 (two) times a week. 12/21/17   Bo Merino, MD    Family History Family History  Problem Relation Age of Onset  . Aneurysm Mother   . Stroke Sister   . Seizures Sister     Social History Social History   Tobacco Use  . Smoking status: Never Smoker  . Smokeless tobacco: Never Used  Substance Use Topics  . Alcohol use: No  . Drug use: No      Allergies   Hydroxychloroquine and Latex   Review of Systems Review of Systems  Constitutional: Negative for appetite change, chills and fever.  HENT: Negative for ear pain, rhinorrhea, sneezing and sore throat.   Eyes: Negative for photophobia and visual disturbance.  Respiratory: Negative for cough, chest tightness, shortness of breath and wheezing.   Cardiovascular: Positive for leg swelling. Negative for chest pain and palpitations.  Gastrointestinal: Negative for abdominal pain, blood in stool, constipation, diarrhea, nausea and vomiting.  Genitourinary: Negative for dysuria, hematuria and urgency.  Musculoskeletal: Negative for myalgias.  Skin: Negative for rash.  Neurological: Negative for dizziness, weakness and light-headedness.     Physical Exam Updated Vital Signs BP (!) 129/97   Pulse 92   Temp 98.5 F (36.9 C) (Oral)   Resp 18   LMP 02/01/2018   SpO2 99%   Physical Exam  Constitutional: She appears well-developed and well-nourished. No distress.  HENT:  Head: Normocephalic and atraumatic.  Nose: Nose normal.  Eyes: Conjunctivae and EOM are normal. Left eye exhibits no discharge. No scleral icterus.  Neck: Normal range of motion. Neck supple.  Cardiovascular: Normal rate, regular rhythm, normal heart sounds and intact distal pulses. Exam reveals no gallop and no friction rub.  No murmur heard. Pulmonary/Chest: Effort normal and breath sounds normal. No respiratory distress.  Abdominal: Soft. Bowel sounds are normal. She exhibits no distension. There is no tenderness. There is no guarding.  Musculoskeletal: Normal range of motion. She exhibits edema.  Very mild lower extremity edema seen bilaterally.  No calf tenderness bilaterally.  Neurological: She is alert. She exhibits normal muscle tone. Coordination normal.  Skin: Skin is warm and dry. No rash noted.  Psychiatric: She has a normal mood and affect.  Nursing note and vitals reviewed.    ED  Treatments / Results  Labs (all labs ordered are listed, but only abnormal results are displayed) Labs Reviewed  CBC - Abnormal; Notable for the following components:      Result Value   WBC 18.1 (*)    RBC 3.45 (*)    Hemoglobin 9.5 (*)    HCT 30.6 (*)    RDW 16.8 (*)    All other components within normal limits  COMPREHENSIVE METABOLIC PANEL - Abnormal; Notable for the following components:   Glucose, Bld 122 (*)    BUN 5 (*)    Albumin 2.8 (*)    All other components within normal limits    EKG None  Radiology No results found.  Procedures Procedures (including critical care time)  Medications Ordered in ED Medications  ketorolac (TORADOL) 30 MG/ML injection 15 mg (has no administration  in time range)  methylPREDNISolone sodium succinate (SOLU-MEDROL) 125 mg/2 mL injection 60 mg (60 mg Intravenous Given 02/01/18 1343)     Initial Impression / Assessment and Plan / ED Course  I have reviewed the triage vital signs and the nursing notes.  Pertinent labs & imaging results that were available during my care of the patient were reviewed by me and considered in my medical decision making (see chart for details).     Patient, with a past medical history of lupus, who presents to ED for evaluation of 1 week history of lower extremity edema.  States this feels similar to the fluid in her legs when she was first diagnosed with lupus.  States that additional steroids IV and p.o. help with the symptoms at that time.  She denies any prior DVT or PE, shortness of breath, chest pain, injuries or falls, numbness in legs.  On physical exam she is overall well-appearing.  She has slight lower extremity edema in bilateral legs with no calf tenderness bilaterally.  Lab work shows leukocytosis at 18 which could be reactive.  Remainder of CBC, CMP unremarkable.  Patient was given Solu-Medrol and Toradol with improvement in her symptoms.  Will discharge home with prednisone taper and advised her  to follow-up with her primary care provider and specialist for further evaluation.  Patient is agreeable to the plan and is ready for discharge home. Doubt DVT, as she is low risk by Wells criteria. Advised to return to ED for any severe worsening symptoms.  Portions of this note were generated with [employer]. Dictation errors may occur despite best attempts at proofreading.   Final Clinical Impressions(s) / ED Diagnoses   Final diagnoses:  Lupus [facility])    ED Discharge Orders        Ordered    predniSONE (STERAPRED UNI-PAK 21 TAB) 10 MG (21) TBPK tablet  Daily     02/01/18 [address], PA-C 02/01/18 1509    Sherwood Gambler, MD 02/01/18 1609

## 2018-03-09 ENCOUNTER — Other Ambulatory Visit: Payer: Self-pay | Admitting: Rheumatology

## 2018-03-12 ENCOUNTER — Other Ambulatory Visit: Payer: Self-pay | Admitting: Rheumatology

## 2018-03-13 ENCOUNTER — Other Ambulatory Visit: Payer: Self-pay | Admitting: Rheumatology

## 2018-03-18 ENCOUNTER — Emergency Department (HOSPITAL_COMMUNITY)
Admission: EM | Admit: 2018-03-18 | Discharge: 2018-03-19 | Disposition: A | Discharge status: Home / Self Care | Payer: Medicaid Other | Attending: Emergency Medicine | Admitting: Emergency Medicine

## 2018-03-18 ENCOUNTER — Encounter (HOSPITAL_COMMUNITY): Payer: Self-pay | Admitting: *Deleted

## 2018-03-18 ENCOUNTER — Other Ambulatory Visit: Payer: Self-pay

## 2018-03-18 ENCOUNTER — Emergency Department (HOSPITAL_COMMUNITY): Payer: Medicaid Other

## 2018-03-18 DIAGNOSIS — M25512 Pain in left shoulder: Secondary | ICD-10-CM | POA: Insufficient documentation

## 2018-03-18 DIAGNOSIS — M7989 Other specified soft tissue disorders: Secondary | ICD-10-CM

## 2018-03-18 DIAGNOSIS — R609 Edema, unspecified: Secondary | ICD-10-CM | POA: Insufficient documentation

## 2018-03-18 DIAGNOSIS — J302 Other seasonal allergic rhinitis: Secondary | ICD-10-CM | POA: Insufficient documentation

## 2018-03-18 DIAGNOSIS — J029 Acute pharyngitis, unspecified: Secondary | ICD-10-CM

## 2018-03-18 DIAGNOSIS — I10 Essential (primary) hypertension: Secondary | ICD-10-CM | POA: Insufficient documentation

## 2018-03-18 HISTORY — DX: Essential (primary) hypertension: I10

## 2018-03-18 LAB — URINALYSIS, ROUTINE W REFLEX MICROSCOPIC
Bilirubin Urine: NEGATIVE
Glucose, UA: NEGATIVE mg/dL
KETONES UR: NEGATIVE mg/dL
Nitrite: NEGATIVE
PH: 6 (ref 5.0–8.0)
PROTEIN: 100 mg/dL — AB
Specific Gravity, Urine: 1.026 (ref 1.005–1.030)

## 2018-03-18 LAB — COMPREHENSIVE METABOLIC PANEL
ALBUMIN: 3.5 g/dL (ref 3.5–5.0)
ALK PHOS: 53 U/L (ref 38–126)
ALT: 20 U/L (ref 0–44)
ANION GAP: 7 (ref 5–15)
AST: 33 U/L (ref 15–41)
BUN: 12 mg/dL (ref 6–20)
CALCIUM: 8.8 mg/dL — AB (ref 8.9–10.3)
CO2: 24 mmol/L (ref 22–32)
CREATININE: 0.55 mg/dL (ref 0.44–1.00)
Chloride: 108 mmol/L (ref 98–111)
GFR calc Af Amer: >60 mL/min (ref >60)
GFR calc non Af Amer: >60 mL/min (ref >60)
Glucose, Bld: 100 mg/dL — ABNORMAL HIGH (ref 70–99)
Potassium: 4.2 mmol/L (ref 3.5–5.1)
SODIUM: 139 mmol/L (ref 135–145)
Total Bilirubin: 0.5 mg/dL (ref 0.3–1.2)
Total Protein: 7.4 g/dL (ref 6.5–8.1)

## 2018-03-18 LAB — CBC WITH DIFFERENTIAL/PLATELET
BASOS PCT: 0 %
Basophils Absolute: 0 10*3/uL (ref 0.0–0.1)
Eosinophils Absolute: 0 10*3/uL (ref 0.0–0.7)
Eosinophils Relative: 0 %
HEMATOCRIT: 35.5 % — AB (ref 36.0–46.0)
Hemoglobin: 11.3 g/dL — ABNORMAL LOW (ref 12.0–15.0)
Lymphocytes Relative: 12 %
Lymphs Abs: 0.5 10*3/uL — ABNORMAL LOW (ref 0.7–4.0)
MCH: 26.3 pg (ref 26.0–34.0)
MCHC: 31.8 g/dL (ref 30.0–36.0)
MCV: 82.8 fL (ref 78.0–100.0)
MONO ABS: 0.2 10*3/uL (ref 0.1–1.0)
MONOS PCT: 3 %
NEUTROS ABS: 3.7 10*3/uL (ref 1.7–7.7)
Neutrophils Relative %: 85 %
Platelets: 421 10*3/uL — ABNORMAL HIGH (ref 150–400)
RBC: 4.29 MIL/uL (ref 3.87–5.11)
RDW: 15.8 % — AB (ref 11.5–15.5)
WBC: 4.4 10*3/uL (ref 4.0–10.5)

## 2018-03-18 LAB — I-STAT BETA HCG BLOOD, ED (MC, WL, AP ONLY): I-stat hCG, quantitative: <5 m[IU]/mL (ref <5)

## 2018-03-18 LAB — GROUP A STREP BY PCR: Group A Strep by PCR: NOT DETECTED

## 2018-03-18 MED ORDER — HYDROCODONE-ACETAMINOPHEN 5-325 MG PO TABS
1.0000 | ORAL_TABLET | Freq: Once | ORAL | Status: AC
  Administered 2018-03-18: 1 via ORAL
  Filled 2018-03-18: qty 1

## 2018-03-18 MED ORDER — PREDNISONE 10 MG (21) PO TBPK: ORAL_TABLET | Freq: Every day | ORAL | 0 refills | Status: DC

## 2018-03-18 MED ORDER — TRAMADOL HCL 50 MG PO TABS: 50.0000 mg | ORAL_TABLET | Freq: Four times a day (QID) | ORAL | 0 refills | Status: DC | PRN

## 2018-03-18 NOTE — Discharge Instructions (Signed)
We believe that your symptoms are caused by musculoskeletal strain.  Please read through the included information about additional care such as heating pads, over-the-counter pain medicine.  If you were provided a prescription please use it only as needed and as instructed.  Remember that early mobility and using the affected part of your body is actually better than keeping it immobile.  Call your rheumatoid physician in the morning to discuss your symptoms and decide on if the current steroid course is appropriate or if he would like a longer course.   Follow-up with the doctor listed as recommended or return to the emergency department with new or worsening symptoms that concern you.

## 2018-03-18 NOTE — ED Triage Notes (Signed)
Pt reports 1.5 days of swelling in legs and arms and bilateral shoulder pain. Reports hx of lupus, and she has been tapering her prednisone dose. Using OTC meds with mild relief.

## 2018-03-18 NOTE — ED Provider Notes (Signed)
Emergency Department Provider Note   I have reviewed the triage vital signs and the nursing notes.   HISTORY  Chief Complaint Shoulder Pain   HPI [name] [name] is a 22 y.o. female with PMH of HTN and lupus presents to the emergency department for evaluation of swelling in her hands and wrists along with left shoulder pain.  The patient has been tapering off of steroid and has been off entirely for the past 4 to 5 days.  She states symptoms have worsened since stopping steroid.  She is no longer on methotrexate either after an accidental overdose.  She denies any chest pain or difficulty breathing.  No vision changes.  No unilateral weakness or numbness.  Denies any fevers or chills.  She is still urinating normally.  She has tried over-the-counter pain medications with no relief.  Her next appointment with her rheumatologist is next month. Pain worse with movement.    Past Medical History:  Diagnosis Date  . Hypertension   . Lupus ([place])   . Raynaud disease     Patient Active Problem List   Diagnosis Date Noted  . Mucositis oral 01/12/2018  . Vaginal candidiasis 01/12/2018  . Methotrexate toxicity   . Mouth ulcers   . Leukopenia 01/10/2018  . Accidental methotrexate overdose 01/08/2018  . Depression 02/08/2017  . Primary osteoarthritis of both knees 11/09/2016  . Chronic midline low back pain without sciatica 11/09/2016  . Vitamin D deficiency 11/09/2016  . SLE (systemic lupus erythematosus) ([place]) 09/19/2016  . High risk medication use 09/19/2016  . Insomnia 01/29/2016  . Depo-Provera contraceptive status 08/11/2015  . Raynaud's disease 08/11/2015  . Seasonal allergies 08/11/2015  . IDA (iron deficiency anemia) 08/11/2015    Past Surgical History:  Procedure Laterality Date  . FRACTURE SURGERY    . TONSILLECTOMY      Allergies Hydroxychloroquine and Latex  Family History  Problem Relation Age of Onset  . Aneurysm Mother   . Stroke Sister   . Seizures Sister      Social History Social History   Tobacco Use  . Smoking status: Never Smoker  . Smokeless tobacco: Never Used  Substance Use Topics  . Alcohol use: No  . Drug use: No    Review of Systems  Constitutional: No fever/chills Eyes: No visual changes. ENT: No sore throat. Cardiovascular: Denies chest pain. Respiratory: Denies shortness of breath. Gastrointestinal: No abdominal pain.  No nausea, no vomiting.  No diarrhea.  No constipation. Genitourinary: Negative for dysuria. Musculoskeletal: Negative for back pain. Positive left shoulder pain and wrist pain/swelling.  Skin: Negative for rash. Neurological: Negative for headaches, focal weakness or numbness.  10-point ROS otherwise negative.  ____________________________________________   PHYSICAL EXAM:  VITAL SIGNS: ED Triage Vitals  Enc Vitals Group     BP 03/18/18 1913 (!) 163/116     Pulse Rate 03/18/18 1913 (!) 112     Resp 03/18/18 1913 18     Temp 03/18/18 1913 98.3 F (36.8 C)     Temp Source 03/18/18 1913 Oral     SpO2 03/18/18 1913 94 %     Weight 03/18/18 1918 173 lb (78.5 kg)     Height 03/18/18 1918 5\' 2"  (1.575 m)     Pain Score 03/18/18 1917 10   Constitutional: Alert and oriented. Well appearing and in no acute distress. Eyes: Conjunctivae are normal.  Head: Atraumatic. Nose: No congestion/rhinnorhea. Mouth/Throat: Mucous membranes are moist.  Neck: No stridor.  Cardiovascular: Tachycardia. Good peripheral  circulation. Grossly normal heart sounds.   Respiratory: Normal respiratory effort.  No retractions. Lungs CTAB. Gastrointestinal: Soft and nontender. No distention.  Musculoskeletal: No lower extremity tenderness nor edema. Mild left shoulder pain. No swelling, redness, or warmth. Mild wrist and finger swelling bilaterally with no concern for septic arthritis.  Neurologic:  Normal speech and language. No gross focal neurologic deficits are appreciated.  Skin:  Skin is warm, dry and intact. No  rash noted.  ____________________________________________   LABS (all labs ordered are listed, but only abnormal results are displayed)  Labs Reviewed  COMPREHENSIVE METABOLIC PANEL - Abnormal; Notable for the following components:      Result Value   Glucose, Bld 100 (*)    Calcium 8.8 (*)    All other components within normal limits  CBC WITH DIFFERENTIAL/PLATELET - Abnormal; Notable for the following components:   Hemoglobin 11.3 (*)    HCT 35.5 (*)    RDW 15.8 (*)    Platelets 421 (*)    Lymphs Abs 0.5 (*)    All other components within normal limits  URINALYSIS, ROUTINE W REFLEX MICROSCOPIC - Abnormal; Notable for the following components:   Color, Urine AMBER (*)    APPearance HAZY (*)    Hgb urine dipstick LARGE (*)    Protein, ur 100 (*)    Leukocytes, UA SMALL (*)    Bacteria, UA RARE (*)    All other components within normal limits  GROUP A STREP BY PCR  I-STAT BETA HCG BLOOD, ED ([facility], [facility], [facility] ONLY)   ____________________________________________  RADIOLOGY  Dg Shoulder Left  Result Date: 03/18/2018 CLINICAL DATA:  Lupus, flare up of pain in both shoulders mainly anteriorly EXAM: LEFT SHOULDER - 2+ VIEW COMPARISON:  None FINDINGS: Osseous mineralization normal. AC joint alignment normal. No acute fracture, dislocation or bone destruction. Visualized ribs unremarkable. IMPRESSION: Normal exam. Electronically Signed   By: Lavonia Dana M.D.   On: 03/18/2018 22:06    ____________________________________________   PROCEDURES  Procedure(s) performed:   Procedures  None ____________________________________________   INITIAL IMPRESSION / ASSESSMENT AND PLAN / ED COURSE  Pertinent labs & imaging results that were available during my care of the patient were reviewed by me and considered in my medical decision making (see chart for details).  Patient with past medical history of lupus and hypertension presents with joint swelling and left shoulder pain.  No  concern clinically for septic arthritis.  Plain film of the left shoulder reviewed with no acute findings.  Labs including chemistry, UA are at baseline.  Patient is afebrile with normal white count.  Symptoms seem to be flaring in the setting of recently discontinuing her steroid.  She is also no longer on methotrexate.  Plan for brief course of steroids and close rheumatology follow-up.  Provided tramadol for breakthrough pain temporarily.   At this time, I do not feel there is any life-threatening condition present. I have reviewed and discussed all results (EKG, imaging, lab, urine as appropriate), exam findings with patient. I have reviewed nursing notes and appropriate previous records.  I feel the patient is safe to be discharged home without further emergent workup. Discussed usual and customary return precautions. Patient and family (if present) verbalize understanding and are comfortable with this plan.  Patient will follow-up with their primary care provider. If they do not have a primary care provider, information for follow-up has been provided to them. All questions have been answered.  ____________________________________________  FINAL CLINICAL IMPRESSION(S) / ED  DIAGNOSES  Final diagnoses:  Acute pain of left shoulder  Sore throat  Swelling of extremity     MEDICATIONS GIVEN DURING THIS VISIT:  Medications  HYDROcodone-acetaminophen (NORCO/VICODIN) 5-325 MG per tablet 1 tablet (1 tablet Oral Given 03/18/18 2155)     NEW OUTPATIENT MEDICATIONS STARTED DURING THIS VISIT:  New Prescriptions   PREDNISONE (STERAPRED UNI-PAK 21 TAB) 10 MG (21) TBPK TABLET    Take by mouth daily. Take 6 tabs by mouth daily  for 2 days, then 5 tabs for 2 days, then 4 tabs for 2 days, then 3 tabs for 2 days, 2 tabs for 2 days, then 1 tab by mouth daily for 2 days   TRAMADOL (ULTRAM) 50 MG TABLET    Take 1 tablet (50 mg total) by mouth every 6 (six) hours as needed.    Note:  This document was  prepared using Dragon voice recognition software and may include unintentional dictation errors.  Nanda Quinton, MD Emergency Medicine    Vonnie Ligman, [facility], MD 03/18/18 2352

## 2018-03-28 ENCOUNTER — Ambulatory Visit (INDEPENDENT_AMBULATORY_CARE_PROVIDER_SITE_OTHER): Payer: Self-pay | Admitting: Family Medicine

## 2018-03-28 ENCOUNTER — Encounter: Payer: Self-pay | Admitting: Family Medicine

## 2018-03-28 VITALS — BP 140/92 | HR 104 | Temp 98.4°F | Resp 16 | Ht 62.0 in | Wt 159.0 lb

## 2018-03-28 DIAGNOSIS — Z30013 Encounter for initial prescription of injectable contraceptive: Secondary | ICD-10-CM

## 2018-03-28 DIAGNOSIS — K121 Other forms of stomatitis: Secondary | ICD-10-CM

## 2018-03-28 DIAGNOSIS — E559 Vitamin D deficiency, unspecified: Secondary | ICD-10-CM

## 2018-03-28 DIAGNOSIS — M3212 Pericarditis in systemic lupus erythematosus: Secondary | ICD-10-CM

## 2018-03-28 DIAGNOSIS — I10 Essential (primary) hypertension: Secondary | ICD-10-CM

## 2018-03-28 LAB — POCT URINALYSIS DIPSTICK
Bilirubin, UA: NEGATIVE
Glucose, UA: NEGATIVE
Ketones, UA: NEGATIVE
Leukocytes, UA: NEGATIVE
Nitrite, UA: NEGATIVE
Protein, UA: POSITIVE — AB
Spec Grav, UA: >=1.03 — AB (ref 1.010–1.025)
Urobilinogen, UA: 1 E.U./dL
pH, UA: 7 (ref 5.0–8.0)

## 2018-03-28 LAB — POCT URINE PREGNANCY: Preg Test, Ur: NEGATIVE

## 2018-03-28 MED ORDER — MEDROXYPROGESTERONE ACETATE 150 MG/ML IM SUSP
150.0000 mg | Freq: Once | INTRAMUSCULAR | Status: AC
  Administered 2018-03-28: 150 mg via INTRAMUSCULAR

## 2018-03-28 MED ORDER — BACITRACIN 500 UNIT/GM EX OINT: 1.0000 "application " | TOPICAL_OINTMENT | Freq: Two times a day (BID) | CUTANEOUS | 0 refills | Status: DC

## 2018-03-28 MED ORDER — LISINOPRIL 5 MG PO TABS: 5.0000 mg | ORAL_TABLET | Freq: Every day | ORAL | 3 refills | Status: DC

## 2018-03-28 NOTE — Progress Notes (Signed)
Subjective   [name] [name] 22 y.o. female  [id number]  [id number]  Mar 16, 1996    Chief Complaint  Patient presents with  . Establish Care  . Contraception    Patient presents to establish care. Patient was seen in the ED on 03/18/18 for shoulder pain.  She is no longer on methotrexate after an accidental overdose.  She denies any chest pain or difficulty breathing.  No vision changes.  No unilateral weakness or numbness.  Denies any fevers or chills.Patient with pain in the feet and hands. Patient states that she was started back on prednisone in the ED. Patient states that she has Dr. Sheppard Coil at [facility] for rheumatology and is unable to see him until November.     Review of Systems  Constitutional: Negative.   Eyes: Negative.   Respiratory: Negative.   Cardiovascular: Negative.   Gastrointestinal: Negative.   Musculoskeletal: Positive for myalgias (pain in upper and lower extremities. ).  Neurological: Negative.   Psychiatric/Behavioral: Negative.     Objective   Physical Exam  Constitutional: She is oriented to person, place, and time.  HENT:  Head: Normocephalic and atraumatic.  Right Ear: External ear normal.  Left Ear: External ear normal.  Nose: Sinus tenderness (there is a small ulceration noted to the left nare. no exudate at the present. ) present. Right sinus exhibits no maxillary sinus tenderness and no frontal sinus tenderness. Left sinus exhibits no maxillary sinus tenderness and no frontal sinus tenderness.  Mouth/Throat: Uvula is midline and oropharynx is clear and moist. Oral lesions (lower left lip ) present.  Eyes: Pupils are equal, round, and reactive to light. Conjunctivae and EOM are normal.  Neck: Normal range of motion. No JVD present. No thyromegaly present.  Cardiovascular: Normal rate, regular rhythm, normal heart sounds and intact distal pulses. Exam reveals no gallop and no friction rub.  No murmur heard. Pulmonary/Chest: Effort normal and breath  sounds normal. No respiratory distress. She has no wheezes. She has no rales. She exhibits no tenderness.  Abdominal: Soft. Bowel sounds are normal. She exhibits no distension. There is no tenderness.  Musculoskeletal: She exhibits tenderness (upper and lower extremities.).  Lymphadenopathy:    She has no cervical adenopathy.  Neurological: She is alert and oriented to person, place, and time.  Skin: Skin is warm and dry.  Psychiatric: She has a normal mood and affect. Her behavior is normal. Judgment and thought content normal.    BP (!) 140/92 (BP Location: Right Arm, Patient Position: Sitting, Cuff Size: Normal) Comment: manually  Pulse (!) 104   Temp 98.4 F (36.9 C) (Oral)   Resp 16   Ht 5\' 2"  (1.575 m)   Wt 159 lb (72.1 kg)   SpO2 100%   BMI 29.08 kg/m   Assessment   No diagnosis found.   Plan  1. Systemic lupus erythematosus (SLE) with pericarditis, unspecified SLE type ([place]) - Ambulatory referral to Rheumatology - CBC with Differential - Comprehensive metabolic panel - Vitamin D, 25-hydroxy - bacitracin 500 UNIT/GM ointment; Apply 1 application topically 2 (two) times daily.  Dispense: 15 g; Refill: 0  2. Vitamin D deficiency - Vitamin D, 25-hydroxy  3. Essential hypertension - lisinopril (PRINIVIL,ZESTRIL) 5 MG tablet; Take 1 tablet (5 mg total) by mouth daily.  Dispense: 90 tablet; Refill: 3  4. Ulceration of oral mucosa Will check for vitamin deficiencies.  - Vitamin B1  5. Encounter for initial prescription of injectable contraceptive Patient would like to restart Depo today.  Urine HCG negative.  - POCT urine pregnancy - medroxyPROGESTERone (DEPO-PROVERA) injection 150 mg    This note has been created with Surveyor, quantity. Any transcriptional errors are unintentional.

## 2018-03-28 NOTE — Patient Instructions (Addendum)
Hypertension Hypertension, commonly called high blood pressure, is when the force of blood pumping through the arteries is too strong. The arteries are the blood vessels that carry blood from the heart throughout the body. Hypertension forces the heart to work harder to pump blood and may cause arteries to become narrow or stiff. Having untreated or uncontrolled hypertension can cause heart attacks, strokes, kidney disease, and other problems. A blood pressure reading consists of a higher number over a lower number. Ideally, your blood pressure should be below 120/80. The first ("top") number is called the systolic pressure. It is a measure of the pressure in your arteries as your heart beats. The second ("bottom") number is called the diastolic pressure. It is a measure of the pressure in your arteries as the heart relaxes. What are the causes? The cause of this condition is not known. What increases the risk? Some risk factors for high blood pressure are under your control. Others are not. Factors you can change  Smoking.  Having type 2 diabetes mellitus, high cholesterol, or both.  Not getting enough exercise or physical activity.  Being overweight.  Having too much fat, sugar, calories, or salt (sodium) in your diet.  Drinking too much alcohol. Factors that are difficult or impossible to change  Having chronic kidney disease.  Having a family history of high blood pressure.  Age. Risk increases with age.  Race. You may be at higher risk if you are African-American.  Gender. Men are at higher risk than women before age 45. After age 65, women are at higher risk than men.  Having obstructive sleep apnea.  Stress. What are the signs or symptoms? Extremely high blood pressure (hypertensive crisis) may cause:  Headache.  Anxiety.  Shortness of breath.  Nosebleed.  Nausea and vomiting.  Severe chest pain.  Jerky movements you cannot control (seizures).  How is this  diagnosed? This condition is diagnosed by measuring your blood pressure while you are seated, with your arm resting on a surface. The cuff of the blood pressure monitor will be placed directly against the skin of your upper arm at the level of your heart. It should be measured at least twice using the same arm. Certain conditions can cause a difference in blood pressure between your right and left arms. Certain factors can cause blood pressure readings to be lower or higher than normal (elevated) for a short period of time:  When your blood pressure is higher when you are in a health care provider's office than when you are at home, this is called white coat hypertension. Most people with this condition do not need medicines.  When your blood pressure is higher at home than when you are in a health care provider's office, this is called masked hypertension. Most people with this condition may need medicines to control blood pressure.  If you have a high blood pressure reading during one visit or you have normal blood pressure with other risk factors:  You may be asked to return on a different day to have your blood pressure checked again.  You may be asked to monitor your blood pressure at home for 1 week or longer.  If you are diagnosed with hypertension, you may have other blood or imaging tests to help your health care provider understand your overall risk for other conditions. How is this treated? This condition is treated by making healthy lifestyle changes, such as eating healthy foods, exercising more, and reducing your alcohol intake. Your   health care provider may prescribe medicine if lifestyle changes are not enough to get your blood pressure under control, and if:  Your systolic blood pressure is above 130.  Your diastolic blood pressure is above 80.  Your personal target blood pressure may vary depending on your medical conditions, your age, and other factors. Follow these  instructions at home: Eating and drinking  Eat a diet that is high in fiber and potassium, and low in sodium, added sugar, and fat. An example eating plan is called the DASH (Dietary Approaches to Stop Hypertension) diet. To eat this way: ? Eat plenty of fresh fruits and vegetables. Try to fill half of your plate at each meal with fruits and vegetables. ? Eat whole grains, such as whole wheat pasta, brown rice, or whole grain bread. Fill about one quarter of your plate with whole grains. ? Eat or drink low-fat dairy products, such as skim milk or low-fat yogurt. ? Avoid fatty cuts of meat, processed or cured meats, and poultry with skin. Fill about one quarter of your plate with lean proteins, such as fish, chicken without skin, beans, eggs, and tofu. ? Avoid premade and processed foods. These tend to be higher in sodium, added sugar, and fat.  Reduce your daily sodium intake. Most people with hypertension should eat less than 1,500 mg of sodium a day.  Limit alcohol intake to no more than 1 drink a day for nonpregnant women and 2 drinks a day for men. One drink equals 12 oz of beer, 5 oz of wine, or 1 oz of hard liquor. Lifestyle  Work with your health care provider to maintain a healthy body weight or to lose weight. Ask what an ideal weight is for you.  Get at least 30 minutes of exercise that causes your heart to beat faster (aerobic exercise) most days of the week. Activities may include walking, swimming, or biking.  Include exercise to strengthen your muscles (resistance exercise), such as pilates or lifting weights, as part of your weekly exercise routine. Try to do these types of exercises for 30 minutes at least 3 days a week.  Do not use any products that contain nicotine or tobacco, such as cigarettes and e-cigarettes. If you need help quitting, ask your health care provider.  Monitor your blood pressure at home as told by your health care provider.  Keep all follow-up visits as  told by your health care provider. This is important. Medicines  Take over-the-counter and prescription medicines only as told by your health care provider. Follow directions carefully. Blood pressure medicines must be taken as prescribed.  Do not skip doses of blood pressure medicine. Doing this puts you at risk for problems and can make the medicine less effective.  Ask your health care provider about side effects or reactions to medicines that you should watch for. Contact a health care provider if:  You think you are having a reaction to a medicine you are taking.  You have headaches that keep coming back (recurring).  You feel dizzy.  You have swelling in your ankles.  You have trouble with your vision. Get help right away if:  You develop a severe headache or confusion.  You have unusual weakness or numbness.  You feel faint.  You have severe pain in your chest or abdomen.  You vomit repeatedly.  You have trouble breathing. Summary  Hypertension is when the force of blood pumping through your arteries is too strong. If this condition is not   controlled, it may put you at risk for serious complications.  Your personal target blood pressure may vary depending on your medical conditions, your age, and other factors. For most people, a normal blood pressure is less than 120/80.  Hypertension is treated with lifestyle changes, medicines, or a combination of both. Lifestyle changes include weight loss, eating a healthy, low-sodium diet, exercising more, and limiting alcohol. This information is not intended to replace advice given to you by your health care provider. Make sure you discuss any questions you have with your health care provider. Document Released: 08/29/2005 Document Revised: 07/27/2016 Document Reviewed: 07/27/2016 Elsevier Interactive Patient Education  2018 [employer]. Medroxyprogesterone injection [Contraceptive] What is this  medicine? MEDROXYPROGESTERONE (me DROX ee proe JES te rone) contraceptive injections prevent pregnancy. They provide effective birth control for 3 months. Depo-subQ Provera 104 is also used for treating pain related to endometriosis. This medicine may be used for other purposes; ask your health care provider or pharmacist if you have questions. COMMON BRAND NAME(S): Depo-Provera, Depo-subQ Provera 104 What should I tell my health care provider before I take this medicine? They need to know if you have any of these conditions: -frequently drink alcohol -asthma -blood vessel disease or a history of a blood clot in the lungs or legs -bone disease such as osteoporosis -breast cancer -diabetes -eating disorder (anorexia nervosa or bulimia) -high blood pressure -HIV infection or AIDS -kidney disease -liver disease -mental depression -migraine -seizures (convulsions) -stroke -tobacco smoker -vaginal bleeding -an unusual or allergic reaction to medroxyprogesterone, other hormones, medicines, foods, dyes, or preservatives -pregnant or trying to get pregnant -breast-feeding How should I use this medicine? Depo-Provera Contraceptive injection is given into a muscle. Depo-subQ Provera 104 injection is given under the skin. These injections are given by a health care professional. You must not be pregnant before getting an injection. The injection is usually given during the first 5 days after the start of a menstrual period or 6 weeks after delivery of a baby. Talk to your pediatrician regarding the use of this medicine in children. Special care may be needed. These injections have been used in female children who have started having menstrual periods. Overdosage: If you think you have taken too much of this medicine contact a poison control center or emergency room at once. NOTE: This medicine is only for you. Do not share this medicine with others. What if I miss a dose? Try not to miss a dose.  You must get an injection once every 3 months to maintain birth control. If you cannot keep an appointment, call and reschedule it. If you wait longer than 13 weeks between Depo-Provera contraceptive injections or longer than 14 weeks between Depo-subQ Provera 104 injections, you could get pregnant. Use another method for birth control if you miss your appointment. You may also need a pregnancy test before receiving another injection. What may interact with this medicine? Do not take this medicine with any of the following medications: -bosentan This medicine may also interact with the following medications: -aminoglutethimide -antibiotics or medicines for infections, especially rifampin, rifabutin, rifapentine, and griseofulvin -aprepitant -barbiturate medicines such as phenobarbital or primidone -bexarotene -carbamazepine -medicines for seizures like ethotoin, felbamate, oxcarbazepine, phenytoin, topiramate -modafinil -St. John's wort This list may not describe all possible interactions. Give your health care provider a list of all the medicines, herbs, non-prescription drugs, or dietary supplements you use. Also tell them if you smoke, drink alcohol, or use illegal drugs. Some items may interact with  your medicine. What should I watch for while using this medicine? This drug does not protect you against HIV infection (AIDS) or other sexually transmitted diseases. Use of this product may cause you to lose calcium from your bones. Loss of calcium may cause weak bones (osteoporosis). Only use this product for more than 2 years if other forms of birth control are not right for you. The longer you use this product for birth control the more likely you will be at risk for weak bones. Ask your health care professional how you can keep strong bones. You may have a change in bleeding pattern or irregular periods. Many females stop having periods while taking this drug. If you have received your  injections on time, your chance of being pregnant is very low. If you think you may be pregnant, see your health care professional as soon as possible. Tell your health care professional if you want to get pregnant within the next year. The effect of this medicine may last a long time after you get your last injection. What side effects may I notice from receiving this medicine? Side effects that you should report to your doctor or health care professional as soon as possible: -allergic reactions like skin rash, itching or hives, swelling of the face, lips, or tongue -breast tenderness or discharge -breathing problems -changes in vision -depression -feeling faint or lightheaded, falls -fever -pain in the abdomen, chest, groin, or leg -problems with balance, talking, walking -unusually weak or tired -yellowing of the eyes or skin Side effects that usually do not require medical attention (report to your doctor or health care professional if they continue or are bothersome): -acne -fluid retention and swelling -headache -irregular periods, spotting, or absent periods -temporary pain, itching, or skin reaction at site where injected -weight gain This list may not describe all possible side effects. Call your doctor for medical advice about side effects. You may report side effects to FDA at 1-800-FDA-1088. Where should I keep my medicine? This does not apply. The injection will be given to you by a health care professional. NOTE: This sheet is a summary. It may not cover all possible information. If you have questions about this medicine, talk to your doctor, pharmacist, or health care provider.  2018 Elsevier/Gold Standard (2008-09-19 18:37:56)

## 2018-03-31 LAB — CBC WITH DIFFERENTIAL/PLATELET
Basophils Absolute: 0 10*3/uL (ref 0.0–0.2)
Basos: 0 %
EOS (ABSOLUTE): 0 10*3/uL (ref 0.0–0.4)
Eos: 1 %
Hematocrit: 34.3 % (ref 34.0–46.6)
Hemoglobin: 10.7 g/dL — ABNORMAL LOW (ref 11.1–15.9)
Immature Grans (Abs): 0.1 10*3/uL (ref 0.0–0.1)
Immature Granulocytes: 1 %
Lymphocytes Absolute: 0.5 10*3/uL — ABNORMAL LOW (ref 0.7–3.1)
Lymphs: 7 %
MCH: 24.8 pg — ABNORMAL LOW (ref 26.6–33.0)
MCHC: 31.2 g/dL — ABNORMAL LOW (ref 31.5–35.7)
MCV: 79 fL (ref 79–97)
Monocytes Absolute: 0.3 10*3/uL (ref 0.1–0.9)
Monocytes: 4 %
Neutrophils Absolute: 5.7 10*3/uL (ref 1.4–7.0)
Neutrophils: 87 %
Platelets: 370 10*3/uL (ref 150–450)
RBC: 4.32 x10E6/uL (ref 3.77–5.28)
RDW: 16.2 % — ABNORMAL HIGH (ref 12.3–15.4)
WBC: 6.5 10*3/uL (ref 3.4–10.8)

## 2018-03-31 LAB — VITAMIN D 25 HYDROXY (VIT D DEFICIENCY, FRACTURES): Vit D, 25-Hydroxy: 84.3 ng/mL (ref 30.0–100.0)

## 2018-03-31 LAB — COMPREHENSIVE METABOLIC PANEL
ALT: 14 IU/L (ref 0–32)
AST: 25 IU/L (ref 0–40)
Albumin/Globulin Ratio: 1.1 — ABNORMAL LOW (ref 1.2–2.2)
Albumin: 3.9 g/dL (ref 3.5–5.5)
Alkaline Phosphatase: 68 IU/L (ref 39–117)
BUN/Creatinine Ratio: 17 (ref 9–23)
BUN: 11 mg/dL (ref 6–20)
Bilirubin Total: <0.2 mg/dL (ref 0.0–1.2)
CO2: 24 mmol/L (ref 20–29)
Calcium: 8.9 mg/dL (ref 8.7–10.2)
Chloride: 102 mmol/L (ref 96–106)
Creatinine, Ser: 0.64 mg/dL (ref 0.57–1.00)
GFR calc Af Amer: 145 mL/min/{1.73_m2} (ref >59)
GFR calc non Af Amer: 126 mL/min/{1.73_m2} (ref >59)
Globulin, Total: 3.6 g/dL (ref 1.5–4.5)
Glucose: 82 mg/dL (ref 65–99)
Potassium: 4.5 mmol/L (ref 3.5–5.2)
Sodium: 140 mmol/L (ref 134–144)
Total Protein: 7.5 g/dL (ref 6.0–8.5)

## 2018-03-31 LAB — VITAMIN B1: Thiamine: 134.5 nmol/L (ref 66.5–200.0)

## 2018-04-03 ENCOUNTER — Ambulatory Visit (HOSPITAL_COMMUNITY)
Admission: EM | Admit: 2018-04-03 | Discharge: 2018-04-03 | Disposition: A | Discharge status: Home / Self Care | Payer: Self-pay | Attending: Family Medicine | Admitting: Family Medicine

## 2018-04-03 ENCOUNTER — Encounter (HOSPITAL_COMMUNITY): Payer: Self-pay

## 2018-04-03 DIAGNOSIS — M25562 Pain in left knee: Secondary | ICD-10-CM

## 2018-04-03 DIAGNOSIS — M25561 Pain in right knee: Secondary | ICD-10-CM

## 2018-04-03 MED ORDER — PREDNISONE 10 MG (21) PO TBPK: ORAL_TABLET | Freq: Every day | ORAL | 0 refills | Status: DC

## 2018-04-03 NOTE — Discharge Instructions (Addendum)
Take prednisone as directed.  Take all of day 1 today. I spoke with your rheumatologist, Dr. [name].  He states you have an appointment with him on July 31.  Please keep this appointment.  Call their office to discuss medication approval.  [phone].  He would like to see regardless of your insurance status.

## 2018-04-03 NOTE — ED Provider Notes (Signed)
[facility]    CSN: [id number] Arrival date & time: 04/03/18  0847     History   Chief Complaint Chief Complaint  Patient presents with  . Knee Pain    Both    HPI [name] [name] is a 22 y.o. female.   HPI  Patient is here for bilateral knee pain.  She states that been bothering her for 3 days.  She now can hardly bear weight without pain.  She states they are both swollen.  They are both warm.  She also has swelling and warmth in her left shoulder.  Her hands are diffusely swollen.  She has known lupus.  She gets periodic flares.  I discussed with her that the medicine for this flare would likely be prednisone.  She tells me that her rheumatologist wants to keep her off of prednisone.  I am not sure what else would help her, perhaps an NSAID.  Patient requests a call her rheumatologist for consultation.  Call is placed. Her knees are bothering her for 3 days, no accident, no injury, no overuse.  No fall.  She states she periodically will have a flareup and knee pain. She states she is otherwise well.  No recent illness.  She is compliant with her medications.   Past Medical History:  Diagnosis Date  . Hypertension   . Lupus ([place])   . Raynaud disease     Patient Active Problem List   Diagnosis Date Noted  . Mucositis oral 01/12/2018  . Vaginal candidiasis 01/12/2018  . Methotrexate toxicity   . Mouth ulcers   . Leukopenia 01/10/2018  . Accidental methotrexate overdose 01/08/2018  . Depression 02/08/2017  . Primary osteoarthritis of both knees 11/09/2016  . Chronic midline low back pain without sciatica 11/09/2016  . Vitamin D deficiency 11/09/2016  . SLE (systemic lupus erythematosus) ([place]) 09/19/2016  . High risk medication use 09/19/2016  . Insomnia 01/29/2016  . Depo-Provera contraceptive status 08/11/2015  . Raynaud's disease 08/11/2015  . Seasonal allergies 08/11/2015  . IDA (iron deficiency anemia) 08/11/2015    Past Surgical History:  Procedure  Laterality Date  . FRACTURE SURGERY    . TONSILLECTOMY      OB History   None      Home Medications    Prior to Admission medications   Medication Sig Start Date End Date Taking? Authorizing Provider  ferrous sulfate 325 (65 FE) MG tablet Take 1 tablet (325 mg total) by mouth 2 (two) times daily. With 1/2 glass of orange juice 12/15/15   Davonna Belling, MD  folic acid (FOLVITE) 1 MG tablet TAKE 2 TABLETS (2 MG TOTAL) BY MOUTH DAILY. 12/11/17   Bo Merino, MD  lisinopril (PRINIVIL,ZESTRIL) 5 MG tablet Take 1 tablet (5 mg total) by mouth daily. 03/28/18   [name], FNP  predniSONE (STERAPRED UNI-PAK 21 TAB) 10 MG (21) TBPK tablet Take by mouth daily. tad 04/03/18   Raylene Everts, MD  traMADol (ULTRAM) 50 MG tablet Take 1 tablet (50 mg total) by mouth every 6 (six) hours as needed. 03/18/18   Long, Wonda Olds, MD  Vitamin D, Ergocalciferol, (DRISDOL) 50000 units CAPS capsule Take 1 capsule (50,000 Units total) by mouth 2 (two) times a week. 12/21/17   Bo Merino, MD    Family History Family History  Problem Relation Age of Onset  . Aneurysm Mother   . Stroke Sister   . Seizures Sister     Social History Social History   Tobacco  Use  . Smoking status: Never Smoker  . Smokeless tobacco: Never Used  Substance Use Topics  . Alcohol use: No  . Drug use: No     Allergies   Hydroxychloroquine and Latex   Review of Systems Review of Systems  Constitutional: Positive for fatigue. Negative for chills and fever.  HENT: Negative for ear pain and sore throat.   Eyes: Negative for pain and visual disturbance.  Respiratory: Negative for cough and shortness of breath.   Cardiovascular: Negative for chest pain and palpitations.  Gastrointestinal: Negative for abdominal pain and vomiting.  Genitourinary: Negative for dysuria and hematuria.  Musculoskeletal: Positive for arthralgias, gait problem and joint swelling. Negative for back pain.  Skin: Negative for color  change and rash.  Neurological: Negative for seizures and syncope.  All other systems reviewed and are negative.    Physical Exam Triage Vital Signs ED Triage Vitals  Enc Vitals Group     BP 04/03/18 0900 136/89     Pulse Rate 04/03/18 0900 100     Resp 04/03/18 0900 20     Temp 04/03/18 0900 99 F (37.2 C)     Temp Source 04/03/18 0900 Oral     SpO2 04/03/18 0900 100 %     Weight --      Height --      Head Circumference --      Peak Flow --      Pain Score 04/03/18 0859 10     Pain Loc --      Pain Edu? --      Excl. in [place]? --    No data found.  Updated Vital Signs BP 136/89 (BP Location: Left Arm)   Pulse 100   Temp 99 F (37.2 C) (Oral)   Resp 20   LMP 03/13/2018   SpO2 100%      Physical Exam  Constitutional: She appears well-developed and well-nourished. She appears distressed.  Appears uncomfortable.  Cradling hands in lap. in wheelchair.  HENT:  Head: Normocephalic and atraumatic.  Mouth/Throat: Oropharynx is clear and moist.  Eyes: Pupils are equal, round, and reactive to light. Conjunctivae are normal.  Neck: Normal range of motion.  Cardiovascular: Normal rate, regular rhythm and normal heart sounds.  Pulmonary/Chest: Effort normal and breath sounds normal. No respiratory distress.  Abdominal: Soft. She exhibits no distension.  Musculoskeletal: Normal range of motion. She exhibits no edema.  Both hands are puffy and slightly swollen.  No tenderness to palpation of joints.  Knees with warmth and effusion, pain with range of motion, tenderness to joint line palpation.  Left shoulder with very limited range of motion.  Warmth.  No visible swelling  Neurological: She is alert.  Skin: Skin is warm and dry. No rash noted.  Psychiatric: She has a normal mood and affect. Her behavior is normal.     UC Treatments / Results  Labs (all labs ordered are listed, but only abnormal results are displayed) Labs Reviewed - No data to  display  EKG None  Radiology No results found.  Procedures Procedures (including critical care time)  Medications Ordered in UC Medications - No data to display  Initial Impression / Assessment and Plan / UC Course  I have reviewed the triage vital signs and the nursing notes.  Pertinent labs & imaging results that were available during my care of the patient were reviewed by me and considered in my medical decision making (see chart for details).     I  called her rheumatologist to [place] with Dr. Eber Hong.  He states she has an appointment with him on July 31.  He would like to see her at that time.  He would like to get her on additional medication.  He understands that she has had a lapse in her insurance.  He also went to explain to her that they can petition the insurance company for her medication at low cost or no cost. He does believe that at this point with the flare of the fastest thing to make her feel better would be prednisone for a few days.  I ordered a Dosepak.  She is responded to this in the past. Final Clinical Impressions(s) / UC Diagnoses   Final diagnoses:  Acute bilateral knee pain     Discharge Instructions     Take prednisone as directed.  Take all of day 1 today. I spoke with your rheumatologist, Dr. [name].  He states you have an appointment with him on July 31.  Please keep this appointment.  Call their office to discuss medication approval.  [phone].  He would like to see regardless of your insurance status.    ED Prescriptions    Medication Sig Dispense Auth. Provider   predniSONE (STERAPRED UNI-PAK 21 TAB) 10 MG (21) TBPK tablet Take by mouth daily. tad 21 tablet Raylene Everts, MD     Controlled Substance Prescriptions [place] Controlled Substance Registry consulted? Not Applicable   Raylene Everts, MD 04/03/18 1032

## 2018-04-03 NOTE — ED Triage Notes (Signed)
Pt presents with pain in both knees

## 2018-04-17 ENCOUNTER — Telehealth: Payer: Self-pay

## 2018-04-17 NOTE — Telephone Encounter (Signed)
Called patient, she was asking if the depo shot should be affective now, she had the shot 3 weeks ago. I advised that yes it should have taken affect by now. Thanks!

## 2018-04-23 ENCOUNTER — Encounter: Payer: Self-pay | Admitting: Family Medicine

## 2018-04-23 ENCOUNTER — Ambulatory Visit (INDEPENDENT_AMBULATORY_CARE_PROVIDER_SITE_OTHER): Payer: Self-pay | Admitting: Family Medicine

## 2018-04-23 VITALS — BP 130/90 | HR 112 | Temp 100.2°F | Resp 16 | Ht 62.0 in | Wt 156.0 lb

## 2018-04-23 DIAGNOSIS — Z7251 High risk heterosexual behavior: Secondary | ICD-10-CM

## 2018-04-23 DIAGNOSIS — N898 Other specified noninflammatory disorders of vagina: Secondary | ICD-10-CM

## 2018-04-23 DIAGNOSIS — R300 Dysuria: Secondary | ICD-10-CM

## 2018-04-23 DIAGNOSIS — R809 Proteinuria, unspecified: Secondary | ICD-10-CM

## 2018-04-23 LAB — POCT URINALYSIS DIPSTICK
Bilirubin, UA: NEGATIVE
Glucose, UA: NEGATIVE
Ketones, UA: NEGATIVE
Nitrite, UA: NEGATIVE
Protein, UA: POSITIVE — AB
Spec Grav, UA: 1.02 (ref 1.010–1.025)
Urobilinogen, UA: 0.2 E.U./dL
pH, UA: 7 (ref 5.0–8.0)

## 2018-04-23 MED ORDER — SULFAMETHOXAZOLE-TRIMETHOPRIM 800-160 MG PO TABS: 1.0000 | ORAL_TABLET | Freq: Two times a day (BID) | ORAL | 0 refills | Status: DC

## 2018-04-23 MED ORDER — PHENAZOPYRIDINE HCL 200 MG PO TABS: 200.0000 mg | ORAL_TABLET | Freq: Three times a day (TID) | ORAL | 0 refills | Status: AC | PRN

## 2018-04-23 MED ORDER — FLUCONAZOLE 150 MG PO TABS: 150.0000 mg | ORAL_TABLET | Freq: Once | ORAL | 0 refills | Status: AC

## 2018-04-23 MED ORDER — SULFAMETHOXAZOLE-TRIMETHOPRIM 800-160 MG PO TABS: 1.0000 | ORAL_TABLET | Freq: Two times a day (BID) | ORAL | 0 refills | Status: AC

## 2018-04-23 NOTE — Progress Notes (Signed)
[name], [name] Maples, FNP Chief Complaint  Patient presents with  . Vaginal Discharge  . Dysuria    Current Issues:  Presents with 4 days of dysuria Associated symptoms include:  cloudy urine, dysuria and nausea  There is a previous history of of similar symptoms. Sexually active:  Yes with female.   No concern for STI.  Prior to Admission medications   Medication Sig Start Date End Date Taking? Authorizing Provider  ferrous sulfate 325 (65 FE) MG tablet Take 1 tablet (325 mg total) by mouth 2 (two) times daily. With 1/2 glass of orange juice 12/15/15  Yes Davonna Belling, MD  folic acid (FOLVITE) 1 MG tablet TAKE 2 TABLETS (2 MG TOTAL) BY MOUTH DAILY. 12/11/17  Yes Deveshwar, Abel Presto, MD  lisinopril (PRINIVIL,ZESTRIL) 5 MG tablet Take 1 tablet (5 mg total) by mouth daily. 03/28/18  Yes [name], FNP  predniSONE (DELTASONE) 5 MG tablet Take 5 mg by mouth 2 (two) times daily with a meal.   Yes [provider]  traMADol (ULTRAM) 50 MG tablet Take 1 tablet (50 mg total) by mouth every 6 (six) hours as needed. 03/18/18  Yes Long, Wonda Olds, MD  Vitamin D, Ergocalciferol, (DRISDOL) 50000 units CAPS capsule Take 1 capsule (50,000 Units total) by mouth 2 (two) times a week. 12/21/17  Yes Bo Merino, MD    Review of Systems: Review of Systems  Constitutional: Negative.   HENT: Negative.   Eyes: Negative.   Respiratory: Negative.   Cardiovascular: Negative.   Gastrointestinal: Positive for nausea. Negative for abdominal pain.  Genitourinary: Positive for dysuria.  Musculoskeletal: Negative.   Skin: Negative.   Neurological: Negative.   Psychiatric/Behavioral: Negative.      PE:  BP 130/90 (BP Location: Left Arm, Patient Position: Sitting, Cuff Size: Normal)   Pulse (!) 112   Temp 100.2 F (37.9 C) (Oral)   Resp 16   Ht 5\' 2"  (1.575 m)   Wt 156 lb (70.8 kg)   LMP 03/28/2018   SpO2 96%   BMI 28.53 kg/m    Physical Exam  Constitutional: She is oriented to person,  place, and time and well-developed, well-nourished, and in no distress. No distress.  Cardiovascular: Regular rhythm.  Murmur heard. Tachycardic   Pulmonary/Chest: Effort normal and breath sounds normal.  Abdominal: Soft. Bowel sounds are normal. She exhibits mass. She exhibits no distension. There is no tenderness.  Musculoskeletal: Normal range of motion.  Neurological: She is alert and oriented to person, place, and time.  Skin: Skin is warm and dry.  Psychiatric: Mood, memory, affect and judgment normal.  Nursing note and vitals reviewed.    Results for orders placed or performed in visit on 03/28/18  CBC with Differential  Result Value Ref Range   WBC 6.5 3.4 - 10.8 x10E3/uL   RBC 4.32 3.77 - 5.28 x10E6/uL   Hemoglobin 10.7 (L) 11.1 - 15.9 g/dL   Hematocrit 34.3 34.0 - 46.6 %   MCV 79 79 - 97 fL   MCH 24.8 (L) 26.6 - 33.0 pg   MCHC 31.2 (L) 31.5 - 35.7 g/dL   RDW 16.2 (H) 12.3 - 15.4 %   Platelets 370 150 - 450 x10E3/uL   Neutrophils 87 Not Estab. %   Lymphs 7 Not Estab. %   Monocytes 4 Not Estab. %   Eos 1 Not Estab. %   Basos 0 Not Estab. %   Neutrophils Absolute 5.7 1.4 - 7.0 x10E3/uL   Lymphocytes Absolute 0.5 (L) 0.7 - 3.1  x10E3/uL   Monocytes Absolute 0.3 0.1 - 0.9 x10E3/uL   EOS (ABSOLUTE) 0.0 0.0 - 0.4 x10E3/uL   Basophils Absolute 0.0 0.0 - 0.2 x10E3/uL   Immature Granulocytes 1 Not Estab. %   Immature Grans (Abs) 0.1 0.0 - 0.1 x10E3/uL  Comprehensive metabolic panel  Result Value Ref Range   Glucose 82 65 - 99 mg/dL   BUN 11 6 - 20 mg/dL   Creatinine, Ser 0.64 0.57 - 1.00 mg/dL   GFR calc non Af Amer 126 >59 mL/min/1.73   GFR calc Af Amer 145 >59 mL/min/1.73   BUN/Creatinine Ratio 17 9 - 23   Sodium 140 134 - 144 mmol/L   Potassium 4.5 3.5 - 5.2 mmol/L   Chloride 102 96 - 106 mmol/L   CO2 24 20 - 29 mmol/L   Calcium 8.9 8.7 - 10.2 mg/dL   Total Protein 7.5 6.0 - 8.5 g/dL   Albumin 3.9 3.5 - 5.5 g/dL   Globulin, Total 3.6 1.5 - 4.5 g/dL    Albumin/Globulin Ratio 1.1 (L) 1.2 - 2.2   Bilirubin Total <0.2 0.0 - 1.2 mg/dL   Alkaline Phosphatase 68 39 - 117 IU/L   AST 25 0 - 40 IU/L   ALT 14 0 - 32 IU/L  Vitamin D, 25-hydroxy  Result Value Ref Range   Vit D, 25-Hydroxy 84.3 30.0 - 100.0 ng/mL  Vitamin B1  Result Value Ref Range   Thiamine 134.5 66.5 - 200.0 nmol/L  POCT urine pregnancy  Result Value Ref Range   Preg Test, Ur Negative Negative  Urinalysis Dipstick  Result Value Ref Range   Color, UA     Clarity, UA     Glucose, UA Negative Negative   Bilirubin, UA neg    Ketones, UA neg    Spec Grav, UA >=1.030 (A) 1.010 - 1.025   Blood, UA small    pH, UA 7.0 5.0 - 8.0   Protein, UA Positive (A) Negative   Urobilinogen, UA 1.0 0.2 or 1.0 E.U./dL   Nitrite, UA neg    Leukocytes, UA Negative Negative   Appearance     Odor      Assessment and Plan:  1. Dysuria Will start treatment for UTI. Culture pending.  - Urinalysis Dipstick - Urine culture  2. Unprotected sex - POCT urine pregnancy - Chlamydia/GC NAA, Confirmation  3. Proteinuria, unspecified type CMP ordered. Patient has seen nephrology in the past due to lupus.  - Comprehensive metabolic panel  4. Vaginal itching - Vaginitis/Vaginosis, DNA Probe - fluconazole (DIFLUCAN) 150 MG tablet; Take 1 tablet (150 mg total) by mouth once for 1 dose.  Dispense: 1 tablet; Refill: 0  Return to care as scheduled and prn. Patient verbalized understanding and agreed with plan of care.    [name], FNP-BC Patient [name] [address] [place], [place] [address] [phone]

## 2018-04-23 NOTE — Patient Instructions (Addendum)
Phenazopyridine tablets What is this medicine? PHENAZOPYRIDINE (fen az oh PEER i deen) is a pain reliever. It is used to stop the pain, burning, or discomfort caused by infection or irritation of the urinary tract. This medicine is not an antibiotic. It will not cure a urinary tract infection. This medicine may be used for other purposes; ask your health care provider or pharmacist if you have questions. COMMON BRAND NAME(S): AZO, Azo-100, Azo-Gesic, Azo-Septic, Azo-Standard, Phenazo, Prodium, Pyridium, Urinary Analgesic, Uristat What should I tell my health care provider before I take this medicine? They need to know if you have any of these conditions: -glucose-6-phosphate dehydrogenase (G6PD) deficiency -kidney disease -an unusual or allergic reaction to phenazopyridine, other medicines, foods, dyes, or preservatives -pregnant or trying to get pregnant -breast-feeding How should I use this medicine? Take this medicine by mouth with a glass of water. Follow the directions on the prescription label. Take after meals. Take your doses at regular intervals. Do not take your medicine more often than directed. Do not skip doses or stop your medicine early even if you feel better. Do not stop taking except on your doctor's advice. Talk to your pediatrician regarding the use of this medicine in children. Special care may be needed. Overdosage: If you think you have taken too much of this medicine contact a poison control center or emergency room at once. NOTE: This medicine is only for you. Do not share this medicine with others. What if I miss a dose? If you miss a dose, take it as soon as you can. If it is almost time for your next dose, take only that dose. Do not take double or extra doses. What may interact with this medicine? Interactions are not expected. This list may not describe all possible interactions. Give your health care provider a list of all the medicines, herbs, non-prescription  drugs, or dietary supplements you use. Also tell them if you smoke, drink alcohol, or use illegal drugs. Some items may interact with your medicine. What should I watch for while using this medicine? Tell your doctor or health care professional if your symptoms do not improve or if they get worse. This medicine colors body fluids red. This effect is harmless and will go away after you are done taking the medicine. It will change urine to an dark orange or red color. The red color may stain clothing. Soft contact lenses may become permanently stained. It is best not to wear soft contact lenses while taking this medicine. If you are diabetic you may get a false positive result for sugar in your urine. Talk to your health care provider. What side effects may I notice from receiving this medicine? Side effects that you should report to your doctor or health care professional as soon as possible: -allergic reactions like skin rash, itching or hives, swelling of the face, lips, or tongue -blue or purple color of the skin -difficulty breathing -fever -less urine -unusual bleeding, bruising -unusual tired, weak -vomiting -yellowing of the eyes or skin Side effects that usually do not require medical attention (report to your doctor or health care professional if they continue or are bothersome): -dark urine -headache -stomach upset This list may not describe all possible side effects. Call your doctor for medical advice about side effects. You may report side effects to FDA at 1-800-FDA-1088. Where should I keep my medicine? Keep out of the reach of children. Store at room temperature between 15 and 30 degrees C (59 and 86   degrees F). Protect from light and moisture. Throw away any unused medicine after the expiration date. NOTE: This sheet is a summary. It may not cover all possible information. If you have questions about this medicine, talk to your doctor, pharmacist, or health care provider.   2018 Elsevier/Gold Standard (2008-03-27 11:04:07)   Urinary Tract Infection, Adult A urinary tract infection (UTI) is an infection of any part of the urinary tract. The urinary tract includes the:  Kidneys.  Ureters.  Bladder.  Urethra.  These organs make, store, and get rid of pee (urine) in the body. Follow these instructions at home:  Take over-the-counter and prescription medicines only as told by your doctor.  If you were prescribed an antibiotic medicine, take it as told by your doctor. Do not stop taking the antibiotic even if you start to feel better.  Avoid the following drinks: ? Alcohol. ? Caffeine. ? Tea. ? Carbonated drinks.  Drink enough fluid to keep your pee clear or pale yellow.  Keep all follow-up visits as told by your doctor. This is important.  Make sure to: ? Empty your bladder often and completely. Do not to hold pee for long periods of time. ? Empty your bladder before and after sex. ? Wipe from front to back after a bowel movement if you are female. Use each tissue one time when you wipe. Contact a doctor if:  You have back pain.  You have a fever.  You feel sick to your stomach (nauseous).  You throw up (vomit).  Your symptoms do not get better after 3 days.  Your symptoms go away and then come back. Get help right away if:  You have very bad back pain.  You have very bad lower belly (abdominal) pain.  You are throwing up and cannot keep down any medicines or water. This information is not intended to replace advice given to you by your health care provider. Make sure you discuss any questions you have with your health care provider. Document Released: 02/15/2008 Document Revised: 02/04/2016 Document Reviewed: 07/20/2015 Elsevier Interactive Patient Education  [employer].

## 2018-04-24 LAB — COMPREHENSIVE METABOLIC PANEL
ALT: 21 IU/L (ref 0–32)
AST: 33 IU/L (ref 0–40)
Albumin/Globulin Ratio: 1 — ABNORMAL LOW (ref 1.2–2.2)
Albumin: 3.9 g/dL (ref 3.5–5.5)
Alkaline Phosphatase: 73 IU/L (ref 39–117)
BUN/Creatinine Ratio: 14 (ref 9–23)
BUN: 8 mg/dL (ref 6–20)
Bilirubin Total: 0.2 mg/dL (ref 0.0–1.2)
CO2: 20 mmol/L (ref 20–29)
Calcium: 9 mg/dL (ref 8.7–10.2)
Chloride: 102 mmol/L (ref 96–106)
Creatinine, Ser: 0.59 mg/dL (ref 0.57–1.00)
GFR calc Af Amer: 149 mL/min/{1.73_m2} (ref >59)
GFR calc non Af Amer: 130 mL/min/{1.73_m2} (ref >59)
Globulin, Total: 3.9 g/dL (ref 1.5–4.5)
Glucose: 87 mg/dL (ref 65–99)
Potassium: 4.4 mmol/L (ref 3.5–5.2)
Sodium: 136 mmol/L (ref 134–144)
Total Protein: 7.8 g/dL (ref 6.0–8.5)

## 2018-04-25 ENCOUNTER — Other Ambulatory Visit: Payer: Self-pay | Admitting: Family Medicine

## 2018-04-25 ENCOUNTER — Telehealth: Payer: Self-pay

## 2018-04-25 LAB — URINE CULTURE

## 2018-04-25 LAB — VAGINITIS/VAGINOSIS, DNA PROBE
Candida Species: POSITIVE — AB
Gardnerella vaginalis: POSITIVE — AB
Trichomonas vaginosis: NEGATIVE

## 2018-04-25 MED ORDER — METRONIDAZOLE 0.75 % VA GEL: 1.0000 | Freq: Two times a day (BID) | VAGINAL | 0 refills | Status: DC

## 2018-04-25 MED ORDER — FLUCONAZOLE 150 MG PO TABS: 150.0000 mg | ORAL_TABLET | Freq: Once | ORAL | 0 refills | Status: AC

## 2018-04-25 MED ORDER — PENICILLIN V POTASSIUM 500 MG PO TABS: 500.0000 mg | ORAL_TABLET | Freq: Two times a day (BID) | ORAL | 0 refills | Status: AC

## 2018-04-25 NOTE — Progress Notes (Signed)
Scripts written for BV, yease and UTI.

## 2018-04-25 NOTE — Telephone Encounter (Signed)
Patient called back and I advised to sop bactrim and start penicillin as directed. Advised that she had a yeast infection and bv and that she is also being prescribed diflucan and metrogel for this. Patient verbalized understanding. Thanks!

## 2018-04-25 NOTE — Progress Notes (Signed)
Sending medications to the pharmacy for Bv, Yeast and UTI. Stop bactrim.

## 2018-04-25 NOTE — Telephone Encounter (Signed)
Called, no answer. Left a message for patient to call back. Thanks!  

## 2018-04-25 NOTE — Telephone Encounter (Signed)
-----   Message from [name], [place] sent at 04/25/2018 11:29 AM EDT ----- Sending metrogel for BV, diflucan for yeast and Pen VK for UTI. She can stop bactrim.

## 2018-04-28 LAB — CHLAMYDIA/GC NAA, CONFIRMATION
Chlamydia trachomatis, NAA: NEGATIVE
Neisseria gonorrhoeae, NAA: NEGATIVE

## 2018-04-30 ENCOUNTER — Ambulatory Visit: Payer: Medicaid Other | Admitting: Family Medicine

## 2018-05-04 ENCOUNTER — Ambulatory Visit: Payer: Medicaid Other | Admitting: Family Medicine

## 2018-05-21 ENCOUNTER — Encounter: Payer: Self-pay | Admitting: Family Medicine

## 2018-05-21 ENCOUNTER — Ambulatory Visit (INDEPENDENT_AMBULATORY_CARE_PROVIDER_SITE_OTHER): Payer: Self-pay | Admitting: Family Medicine

## 2018-05-21 VITALS — BP 139/95 | HR 105 | Temp 99.5°F | Resp 16 | Ht 62.0 in | Wt 159.0 lb

## 2018-05-21 DIAGNOSIS — M6289 Other specified disorders of muscle: Secondary | ICD-10-CM

## 2018-05-21 DIAGNOSIS — J029 Acute pharyngitis, unspecified: Secondary | ICD-10-CM

## 2018-05-21 DIAGNOSIS — M3212 Pericarditis in systemic lupus erythematosus: Secondary | ICD-10-CM

## 2018-05-21 LAB — POCT RAPID STREP A (OFFICE): Rapid Strep A Screen: NEGATIVE

## 2018-05-21 MED ORDER — METHOCARBAMOL 500 MG PO TABS: 500.0000 mg | ORAL_TABLET | Freq: Three times a day (TID) | ORAL | 0 refills | Status: AC | PRN

## 2018-05-21 MED ORDER — NYSTATIN 100000 UNIT/ML MT SUSP: 5.0000 mL | Freq: Four times a day (QID) | OROMUCOSAL | 0 refills | Status: AC

## 2018-05-21 NOTE — Patient Instructions (Addendum)
Warm salt water gargles 4 times a day as needed for throat pain. I am sending a liquid medication for you to gargle with 4 times a day. I also sent a muscle relaxer.      Pharyngitis Pharyngitis is a sore throat (pharynx). There is redness, pain, and swelling of your throat. Follow these instructions at home:  Drink enough fluids to keep your pee (urine) clear or pale yellow.  Only take medicine as told by your doctor. ? You may get sick again if you do not take medicine as told. Finish your medicines, even if you start to feel better. ? Do not take aspirin.  Rest.  Rinse your mouth (gargle) with salt water ( tsp of salt per 1 qt of water) every 1-2 hours. This will help the pain.  If you are not at risk for choking, you can suck on hard candy or sore throat lozenges. Contact a doctor if:  You have large, tender lumps on your neck.  You have a rash.  You cough up green, yellow-brown, or bloody spit. Get help right away if:  You have a stiff neck.  You drool or cannot swallow liquids.  You throw up (vomit) or are not able to keep medicine or liquids down.  You have very bad pain that does not go away with medicine.  You have problems breathing (not from a stuffy nose). This information is not intended to replace advice given to you by your health care provider. Make sure you discuss any questions you have with your health care provider. Document Released: 02/15/2008 Document Revised: 02/04/2016 Document Reviewed: 05/06/2013 Elsevier Interactive Patient Education  2017 [place], Adult Oral thrush is an infection in your mouth and throat. It causes white patches on your tongue and in your mouth. Follow these instructions at home: Helping with soreness  To lessen your pain: ? Drink cold liquids, like water and iced tea. ? Eat frozen ice pops or frozen juices. ? Eat foods that are easy to swallow, like gelatin and ice cream. ? Drink from a straw if  the patches in your mouth are painful. General instructions   Take or use over-the-counter and prescription medicines only as told by your doctor. Medicine for oral thrush may be something to swallow, or it may be something to put on the infected area.  Eat plain yogurt that has live cultures in it. Read the label to make sure.  If you wear dentures: ? Take out your dentures before you go to bed. ? Brush them well. ? Soak them in a denture cleaner.  Rinse your mouth with warm salt-water many times a day. To make the salt-water mixture, completely dissolve 1/2-1 teaspoon of salt in 1 cup of warm water. Contact a doctor if:  Your problems are getting worse.  Your problems do not get better in less than 7 days with treatment.  Your infection is spreading. This may show as white patches on the skin outside of your mouth.  You are nursing your baby and you have redness and pain in the nipples. This information is not intended to replace advice given to you by your health care provider. Make sure you discuss any questions you have with your health care provider. Document Released: 11/23/2009 Document Revised: 05/23/2016 Document Reviewed: 05/23/2016 Elsevier Interactive Patient Education  2017 [employer].

## 2018-05-21 NOTE — Progress Notes (Signed)
  [name]   Progress Note: General Provider: [name], FNP  SUBJECTIVE:   [name] [name] is a 22 y.o. female who  has a past medical history of Hypertension, Lupus ([place]), and Raynaud disease.. Patient presents today for Sore Throat; Nasal Congestion; and Shoulder Pain (tightness in both shoulders x 3 days )  Patient presents with sore throat and nasal congestion x 1 week. Patient denies hx of allergic rhinitis. Has been taking dayquil without relief.  Review of Systems  Constitutional: Negative.   HENT: Positive for congestion, sinus pain and sore throat.   Eyes: Negative.   Respiratory: Negative.  Negative for cough.   Cardiovascular: Negative.   Gastrointestinal: Negative.   Genitourinary: Negative.   Musculoskeletal: Positive for myalgias (neck and shoulder stiffness. ) and neck pain (stiffness without pain).  Skin: Negative.   Neurological: Negative.   Psychiatric/Behavioral: Negative.      OBJECTIVE: BP (!) 139/95 (BP Location: Left Arm, Patient Position: Sitting, Cuff Size: Normal)   Pulse (!) 105   Temp 99.5 F (37.5 C) (Oral)   Resp 16   Ht 5\' 2"  (1.575 m)   Wt 159 lb (72.1 kg)   BMI 29.08 kg/m   Physical Exam  Constitutional: She is oriented to person, place, and time. She appears well-developed and well-nourished. No distress.  HENT:  Head: Normocephalic and atraumatic.  Right Ear: Hearing, tympanic membrane and ear canal normal.  Left Ear: Hearing, tympanic membrane and ear canal normal.  There is white coating on the tongue and mucosa. Cannot visualize the throat due to patient stating that she cannot open her mouth.   Eyes: Pupils are equal, round, and reactive to light. Conjunctivae and EOM are normal.  Neck: Normal range of motion.  Cardiovascular: Normal rate, regular rhythm, normal heart sounds and intact distal pulses.  Pulmonary/Chest: Effort normal and breath sounds normal. No respiratory  distress.  Abdominal: Soft. Bowel sounds are normal. She exhibits no distension.  Musculoskeletal: Normal range of motion.  Neurological: She is alert and oriented to person, place, and time.  Skin: Skin is warm and dry.  Psychiatric: She has a normal mood and affect. Her behavior is normal. Thought content normal.  Nursing note and vitals reviewed.   ASSESSMENT/PLAN:  1. Pharyngitis, unspecified etiology Treating for thrush. Patient with a hx of oral mucositis.  - Rapid Strep A- negative - nystatin (MYCOSTATIN) 100000 UNIT/ML suspension; Take 5 mLs (500,000 Units total) by mouth 4 (four) times daily for 10 days.  Dispense: 200 mL; Refill: 0   2. Muscle tightness - methocarbamol (ROBAXIN) 500 MG tablet; Take 1 tablet (500 mg total) by mouth every 8 (eight) hours as needed for up to 7 days for muscle spasms.  Dispense: 15 tablet; Refill: 0      The patient was given clear instructions to go to ER or return to medical center if symptoms do not improve, worsen or new problems develop. The patient verbalized understanding and agreed with plan of care.   [name], FNP-BC Patient [facility] [address] [place], [place] [address] [phone]     This note has been created with Dragon speech recognition software and smart phrase technology. Any transcriptional errors are unintentional.

## 2018-05-30 ENCOUNTER — Emergency Department (HOSPITAL_COMMUNITY): Payer: Self-pay

## 2018-05-30 ENCOUNTER — Emergency Department (HOSPITAL_COMMUNITY)
Admission: EM | Admit: 2018-05-30 | Discharge: 2018-05-30 | Disposition: A | Discharge status: Home / Self Care | Payer: Self-pay | Attending: Emergency Medicine | Admitting: Emergency Medicine

## 2018-05-30 ENCOUNTER — Other Ambulatory Visit: Payer: Self-pay

## 2018-05-30 DIAGNOSIS — M25512 Pain in left shoulder: Secondary | ICD-10-CM | POA: Insufficient documentation

## 2018-05-30 DIAGNOSIS — Z7952 Long term (current) use of systemic steroids: Secondary | ICD-10-CM | POA: Insufficient documentation

## 2018-05-30 DIAGNOSIS — R079 Chest pain, unspecified: Secondary | ICD-10-CM

## 2018-05-30 DIAGNOSIS — M549 Dorsalgia, unspecified: Secondary | ICD-10-CM | POA: Insufficient documentation

## 2018-05-30 DIAGNOSIS — M25511 Pain in right shoulder: Secondary | ICD-10-CM | POA: Insufficient documentation

## 2018-05-30 DIAGNOSIS — M3210 Systemic lupus erythematosus, organ or system involvement unspecified: Secondary | ICD-10-CM | POA: Insufficient documentation

## 2018-05-30 DIAGNOSIS — J029 Acute pharyngitis, unspecified: Secondary | ICD-10-CM | POA: Insufficient documentation

## 2018-05-30 DIAGNOSIS — R0602 Shortness of breath: Secondary | ICD-10-CM | POA: Insufficient documentation

## 2018-05-30 DIAGNOSIS — R5383 Other fatigue: Secondary | ICD-10-CM | POA: Insufficient documentation

## 2018-05-30 DIAGNOSIS — Z79899 Other long term (current) drug therapy: Secondary | ICD-10-CM | POA: Insufficient documentation

## 2018-05-30 DIAGNOSIS — R0789 Other chest pain: Secondary | ICD-10-CM | POA: Insufficient documentation

## 2018-05-30 DIAGNOSIS — I10 Essential (primary) hypertension: Secondary | ICD-10-CM | POA: Insufficient documentation

## 2018-05-30 DIAGNOSIS — B370 Candidal stomatitis: Secondary | ICD-10-CM

## 2018-05-30 DIAGNOSIS — M7918 Myalgia, other site: Secondary | ICD-10-CM | POA: Insufficient documentation

## 2018-05-30 DIAGNOSIS — Z91040 Latex allergy status: Secondary | ICD-10-CM | POA: Insufficient documentation

## 2018-05-30 LAB — I-STAT TROPONIN, ED: TROPONIN I, POC: 0 ng/mL (ref 0.00–0.08)

## 2018-05-30 LAB — I-STAT BETA HCG BLOOD, ED (MC, WL, AP ONLY): I-stat hCG, quantitative: <5 m[IU]/mL (ref <5)

## 2018-05-30 LAB — BASIC METABOLIC PANEL
ANION GAP: 9 (ref 5–15)
BUN: 12 mg/dL (ref 6–20)
CALCIUM: 9 mg/dL (ref 8.9–10.3)
CO2: 22 mmol/L (ref 22–32)
Chloride: 107 mmol/L (ref 98–111)
Creatinine, Ser: 0.52 mg/dL (ref 0.44–1.00)
GFR calc non Af Amer: >60 mL/min (ref >60)
Glucose, Bld: 95 mg/dL (ref 70–99)
POTASSIUM: 4 mmol/L (ref 3.5–5.1)
Sodium: 138 mmol/L (ref 135–145)

## 2018-05-30 LAB — CBC
HCT: 35 % — ABNORMAL LOW (ref 36.0–46.0)
HEMOGLOBIN: 11 g/dL — AB (ref 12.0–15.0)
MCH: 23.9 pg — AB (ref 26.0–34.0)
MCHC: 31.4 g/dL (ref 30.0–36.0)
MCV: 76.1 fL — ABNORMAL LOW (ref 78.0–100.0)
Platelets: 375 10*3/uL (ref 150–400)
RBC: 4.6 MIL/uL (ref 3.87–5.11)
RDW: 17.8 % — ABNORMAL HIGH (ref 11.5–15.5)
WBC: 3.7 10*3/uL — ABNORMAL LOW (ref 4.0–10.5)

## 2018-05-30 LAB — GROUP A STREP BY PCR: Group A Strep by PCR: NOT DETECTED

## 2018-05-30 MED ORDER — GI COCKTAIL ~~LOC~~
30.0000 mL | Freq: Once | ORAL | Status: AC
  Administered 2018-05-30: 30 mL via ORAL
  Filled 2018-05-30: qty 30

## 2018-05-30 MED ORDER — FAMOTIDINE IN NACL 20-0.9 MG/50ML-% IV SOLN
20.0000 mg | Freq: Once | INTRAVENOUS | Status: AC
  Administered 2018-05-30: 20 mg via INTRAVENOUS
  Filled 2018-05-30: qty 50

## 2018-05-30 MED ORDER — IOPAMIDOL (ISOVUE-370) INJECTION 76%
INTRAVENOUS | Status: AC
  Filled 2018-05-30: qty 100

## 2018-05-30 MED ORDER — PANTOPRAZOLE SODIUM 20 MG PO TBEC: 20.0000 mg | DELAYED_RELEASE_TABLET | Freq: Every day | ORAL | 0 refills | Status: DC

## 2018-05-30 MED ORDER — HYDROCODONE-ACETAMINOPHEN 5-325 MG PO TABS: 1.0000 | ORAL_TABLET | Freq: Four times a day (QID) | ORAL | 0 refills | Status: DC | PRN

## 2018-05-30 MED ORDER — LIDOCAINE 5 % EX PTCH
2.0000 | MEDICATED_PATCH | CUTANEOUS | Status: DC
  Administered 2018-05-30: 2 via TRANSDERMAL
  Filled 2018-05-30: qty 2

## 2018-05-30 MED ORDER — IOPAMIDOL (ISOVUE-370) INJECTION 76%
100.0000 mL | Freq: Once | INTRAVENOUS | Status: AC | PRN
  Administered 2018-05-30: 100 mL via INTRAVENOUS

## 2018-05-30 MED ORDER — OXYCODONE-ACETAMINOPHEN 5-325 MG PO TABS: 1.0000 | ORAL_TABLET | Freq: Once | ORAL | Status: DC

## 2018-05-30 MED ORDER — SUCRALFATE 1 GM/10ML PO SUSP: 1.0000 g | Freq: Three times a day (TID) | ORAL | 0 refills | Status: DC

## 2018-05-30 MED ORDER — FLUCONAZOLE 200 MG PO TABS
400.0000 mg | ORAL_TABLET | Freq: Once | ORAL | Status: AC
  Administered 2018-05-30: 400 mg via ORAL
  Filled 2018-05-30: qty 2

## 2018-05-30 MED ORDER — HYDROMORPHONE HCL 1 MG/ML IJ SOLN: 1.0000 mg | INTRAMUSCULAR | Status: DC | PRN

## 2018-05-30 MED ORDER — FLUCONAZOLE 200 MG PO TABS: 200.0000 mg | ORAL_TABLET | Freq: Every day | ORAL | 0 refills | Status: DC

## 2018-05-30 MED ORDER — ONDANSETRON 4 MG PO TBDP
4.0000 mg | ORAL_TABLET | Freq: Once | ORAL | Status: AC
  Administered 2018-05-30: 4 mg via ORAL
  Filled 2018-05-30: qty 1

## 2018-05-30 NOTE — ED Provider Notes (Signed)
Physical Exam  BP (!) 159/121 (BP Location: Right Arm)   Pulse 90   Temp 98.3 F (36.8 C)   Resp 16   Ht 5\' 3"  (1.6 m)   Wt 77.1 kg   LMP 05/30/2018   SpO2 98%   BMI 30.11 kg/m   Assumed care from [facility], PA-C at 1600. Briefly, the patient is a 22 y.o. female with PMHx of  has a past medical history of Hypertension, Lupus ([place]), and Raynaud disease. here with bilateral shoulder pain and chest pain.  Patient reports symptoms have been progressively worsening over the past 3 days.  She also reports progressive pain with swallowing.  Patient does report that she was diagnosed with a fungal infection of the mouth a few days ago, but did not have the symptoms.  Patient has any fevers, chills, abdominal pain, nausea, or vomiting.  Patient does report that her shoulder pain is with movement, and she feels that in the bilateral paracervical musculature. Denies hx of DVT/PE. Pt's mother has history of brain aneurysms.  Patient reports 10 mg of prednisone daily.  Labs Reviewed  CBC - Abnormal; Notable for the following components:      Result Value   WBC 3.7 (*)    Hemoglobin 11.0 (*)    HCT 35.0 (*)    MCV 76.1 (*)    MCH 23.9 (*)    RDW 17.8 (*)    All other components within normal limits  GROUP A STREP BY PCR  BASIC METABOLIC PANEL  I-STAT TROPONIN, ED  I-STAT BETA HCG BLOOD, ED ([facility], [facility], [facility] ONLY)    Course of Care:   Physical Exam  Constitutional: She appears well-developed and well-nourished.  Appears uncomfortable.  HENT:  Head: Normocephalic and atraumatic.  Mouth/Throat: Oropharynx is clear and moist.  Eyes: Pupils are equal, round, and reactive to light. Conjunctivae and EOM are normal.  Neck: Normal range of motion. Neck supple.  Cardiovascular: Normal rate, regular rhythm, S1 normal and S2 normal.  No murmur heard. Pulses:      Radial pulses are 2+ on the right side, and 2+ on the left side.  Pulmonary/Chest: Effort normal and breath sounds normal. She has  no wheezes. She has no rales.  Abdominal: Soft. She exhibits no distension. There is no tenderness. There is no guarding.  Musculoskeletal: Normal range of motion. She exhibits no edema or deformity.  Lymphadenopathy:    She has no cervical adenopathy.  Neurological: She is alert.  Cranial nerves grossly intact. Patient moves extremities symmetrically and with good coordination.  Skin: Skin is warm and dry. No rash noted. No erythema.  Psychiatric: She has a normal mood and affect. Her behavior is normal. Judgment and thought content normal.  Nursing note and vitals reviewed.   ED Course/Procedures   Clinical Course as of May 30 2101  Wed May 30, 2018  1502 EKG showed NSR. No ST elevations/depressions or signs of acute ischemia or infarct. This is reassuring in combination with negative troponin which assists in evaluating and ruling out an acute cardiac process. CXR normal.    [GM]  0454 Remaining labs unremarkable.   [GM]  1539 Patient re-evaluated and updated on lab work. She is grimacing and pain and endorses worsening shoulder and back pain. She is now hypertensive with BP of 164/116. BP at initial evaluation was lower. Case discussed with Dr. Charlesetta Shanks. Will order dissection study to evaluate for PE and dissection that could be causing pain.   [GM]  2051 Liver enzymes checked in July 2019. Will proceed with fluconazole.    [AM]    Clinical Course User Index [AM] Albesa Seen, PA-C [GM] [place], Jonelle Sports, [place]    Procedures  MDM   My initial examination, patient is uncomfortable appearing, but with stable vital signs.  Patient did have a couple hypertensive episodes with rate of systolic pressure 676 prior to pain medication.  Patient's work-up significant for no acute cardia pulmonary abnormality and CT Angio.  No dissection or PE identified.  Patient does have fatty liver infiltration which I discussed with her.  Patient did have brief symptomatic improvement  with GI cocktail, and I am most suspicious for erosive esophagitis versus candidal esophagitis at this time.  We will treat with oral fluconazole while GI consultation outpatient is pending.  Patient was placed on PPI and Carafate.  I have reviewed the patient's information in the [facility] for the past 12 months and found them to have no overlapping Rx.  Opiates were prescribed for an acute, painful condition. The patient was given information on side effects and encouraged to use other, non-opiate pain medication primary, only using opiate medicine sparingly for severe pain.  This is a supervised visit with Dr. Quintella Reichert. Evaluation, management, and discharge planning discussed with this attending physician.    Tamala Julian 05/30/18 2109    Charlesetta Shanks, MD 05/31/18 [phone]

## 2018-05-30 NOTE — Discharge Instructions (Addendum)
Please see the information and instructions below regarding your visit.  Your diagnoses today include:  1. Chest pain, unspecified type   2. Oral thrush   3. Acute pain of both shoulders     Tests performed today include: See side panel of your discharge paperwork for testing performed today. Vital signs are listed at the bottom of these instructions.   Your CT scan showed fatty liver infiltration, but no evidence of anything wrong with the vessels leading up from the heart, or the vessels of the lungs.  Medications prescribed:    Take any prescribed medications only as prescribed, and any over the counter medications only as directed on the packaging.  You have been prescribed Norco for pain. This is an opioid pain medication. You may take this medication every 4-6 hours as needed for pain. Only take this medication if you need it for breakthrough pain. .  Do not combine this medication with Tylenol, as it may increase the risk of liver problems.  Do not combine this medication with alcohol.  Please be advised to avoid driving or operating heavy machinery while taking this medication, as it may make you drowsy or impair judgment.   Please start taking a medicine called Carafate or sucralfate.  This can be taken up to 3 times a day, with meals and before bedtime.  Please do not take your proton pump inhibitor (Protonix, Nexium, Prilosec) within 30 minutes of taking Carafate.  Please take Protonix, 20 mg daily.  Please take fluconazole, 200 mg for the next 2 weeks.  You will be following up with gastroenterology.  Home care instructions:  Please follow any educational materials contained in this packet.   Follow-up instructions: Please follow-up with your primary care provider in  for further evaluation of your symptoms if they are not completely improved.   Please follow up with gastroenterology as soon as possible. They should be calling you.   Return instructions:  Please  return to the Emergency Department if you experience worsening symptoms.  Please return to the emergency department if you develop any worsening pain, shortness of breath, fever or chills, or inability to swallow anything by mouth. Please return if you have any other emergent concerns.  Additional Information:   Your vital signs today were: BP (!) 144/107    Pulse 94    Temp 98.3 F (36.8 C)    Resp 19    Ht 5\' 3"  (1.6 m)    Wt 77.1 kg    LMP 05/30/2018    SpO2 96%    BMI 30.11 kg/m  If your blood pressure (BP) was elevated on multiple readings during this visit above 130 for the top number or above 80 for the bottom number, please have this repeated by your primary care provider within one month. --------------  Thank you for allowing [place] to participate in your care today.

## 2018-05-30 NOTE — ED Triage Notes (Signed)
Patient reports bilateral shoulder pain and back pain that started 3 days. Denies any injury. +SOB, -N/V. -dizziness. Patient has hx Lupus.

## 2018-05-30 NOTE — ED Provider Notes (Signed)
[facility] DEPT Provider Note  CSN: [id number] Arrival date & time: 05/30/18  1138    History   Chief Complaint Chief Complaint  Patient presents with  . Back Pain  . Shoulder Pain    HPI [name] [name] is a 22 y.o. female with a medical history of lupus and HTN who presented to the ED for bilateral shoulder and back pain x3 days. She describes constant, severe aching. Associated symptoms: chest pain and SOB. No relieving or worsening factors. Denies any recent injury or trauma. Denies fever, dizziness, lightheadedness, syncope, decreased ROM of extremities, color or temperature changes in extremity, weakness, paresthesias. She denies recent illness, but endorses upper respiratory complaints of congestion, sore throat and rhinorrhea. Patient has had no treatment or medical evaluation prior to ED presentation today. She reports upcoming rheumatology appointment on Friday 06/01/18.   Past Medical History:  Diagnosis Date  . Hypertension   . Lupus ([place])   . Raynaud disease     Patient Active Problem List   Diagnosis Date Noted  . Mucositis oral 01/12/2018  . Vaginal candidiasis 01/12/2018  . Methotrexate toxicity   . Mouth ulcers   . Leukopenia 01/10/2018  . Accidental methotrexate overdose 01/08/2018  . Depression 02/08/2017  . Primary osteoarthritis of both knees 11/09/2016  . Chronic midline low back pain without sciatica 11/09/2016  . Vitamin D deficiency 11/09/2016  . SLE (systemic lupus erythematosus) ([place]) 09/19/2016  . High risk medication use 09/19/2016  . Insomnia 01/29/2016  . Depo-Provera contraceptive status 08/11/2015  . Raynaud's disease 08/11/2015  . Seasonal allergies 08/11/2015  . IDA (iron deficiency anemia) 08/11/2015    Past Surgical History:  Procedure Laterality Date  . FRACTURE SURGERY    . TONSILLECTOMY       OB History   None      Home Medications    Prior to Admission medications   Medication Sig Start  Date End Date Taking? Authorizing Provider  ferrous sulfate 325 (65 FE) MG tablet Take 1 tablet (325 mg total) by mouth 2 (two) times daily. With 1/2 glass of orange juice 12/15/15  Yes Davonna Belling, MD  folic acid (FOLVITE) 1 MG tablet TAKE 2 TABLETS (2 MG TOTAL) BY MOUTH DAILY. 12/11/17  Yes Deveshwar, Abel Presto, MD  lisinopril (PRINIVIL,ZESTRIL) 5 MG tablet Take 1 tablet (5 mg total) by mouth daily. 03/28/18  Yes [name], FNP  nystatin (MYCOSTATIN) 100000 UNIT/ML suspension Take 5 mLs (500,000 Units total) by mouth 4 (four) times daily for 10 days. 05/21/18 05/31/18 Yes [name], FNP  predniSONE (DELTASONE) 5 MG tablet Take 10 mg by mouth daily.    Yes [provider]  Vitamin D, Ergocalciferol, (DRISDOL) 50000 units CAPS capsule Take 1 capsule (50,000 Units total) by mouth 2 (two) times a week. Patient taking differently: Take 50,000 Units by mouth every 7 (seven) days.  12/21/17  Yes Bo Merino, MD    Family History Family History  Problem Relation Age of Onset  . Aneurysm Mother   . Stroke Sister   . Seizures Sister     Social History Social History   Tobacco Use  . Smoking status: Never Smoker  . Smokeless tobacco: Never Used  Substance Use Topics  . Alcohol use: No  . Drug use: No     Allergies   Hydroxychloroquine and Latex   Review of Systems Review of Systems  Constitutional: Positive for fatigue. Negative for diaphoresis and fever.  HENT: Positive for congestion, rhinorrhea and sore  throat. Negative for trouble swallowing.   Eyes: Negative.   Respiratory: Positive for cough and shortness of breath. Negative for choking and wheezing.   Cardiovascular: Positive for chest pain. Negative for palpitations and leg swelling.  Gastrointestinal: Negative.   Endocrine: Negative.   Genitourinary: Negative.   Musculoskeletal: Positive for arthralgias, back pain and myalgias. Negative for neck pain.  Skin: Negative.   Allergic/Immunologic: Positive  for immunocompromised state.       Chronic steroid use  Neurological: Negative for dizziness, weakness, light-headedness, numbness and headaches.  Hematological: Negative.    Physical Exam Updated Vital Signs BP (!) 159/121 (BP Location: Right Arm)   Pulse 90   Temp 98.3 F (36.8 C)   Resp 16   Ht 5\' 3"  (1.6 m)   Wt 77.1 kg   LMP 05/30/2018   SpO2 98%   BMI 30.11 kg/m   Physical Exam  Constitutional: She is cooperative. She appears ill.  Lying in bed. Grimaces with movements due to pain.  HENT:  Head: Normocephalic and atraumatic.  Right Ear: Tympanic membrane, external ear and ear canal normal.  Left Ear: Tympanic membrane, external ear and ear canal normal.  Nose: Rhinorrhea present. Right sinus exhibits no maxillary sinus tenderness and no frontal sinus tenderness. Left sinus exhibits no maxillary sinus tenderness and no frontal sinus tenderness.  Mouth/Throat: Uvula is midline and oropharynx is clear and moist. Mucous membranes are dry. No posterior oropharyngeal edema or posterior oropharyngeal erythema.  Eyes: Pupils are equal, round, and reactive to light. Conjunctivae, EOM and lids are normal. No scleral icterus.  Neck: Normal range of motion and full passive range of motion without pain. Neck supple. Muscular tenderness present. No spinous process tenderness present. Normal range of motion present.  Cardiovascular: Normal rate, regular rhythm and normal heart sounds. Exam reveals no friction rub.  No murmur heard. Pulses:      Radial pulses are 2+ on the right side, and 2+ on the left side.       Dorsalis pedis pulses are 2+ on the right side, and 2+ on the left side.       Posterior tibial pulses are 2+ on the right side, and 2+ on the left side.  Pulmonary/Chest: Effort normal and breath sounds normal. She exhibits no tenderness.  Abdominal: Soft. Normal appearance and bowel sounds are normal. There is no tenderness.  Musculoskeletal:  Full ROM of upper extremities  bilaterally with 5/5 strength. Trapezius and deltoids tender to palpation and with ROMs. No bony tenderness or deformities. Compartments soft.  Neurological: She is alert.  Skin: Skin is warm and dry. Capillary refill takes less than 2 seconds.  Nursing note and vitals reviewed.    ED Treatments / Results  Labs (all labs ordered are listed, but only abnormal results are displayed) Labs Reviewed  CBC - Abnormal; Notable for the following components:      Result Value   WBC 3.7 (*)    Hemoglobin 11.0 (*)    HCT 35.0 (*)    MCV 76.1 (*)    MCH 23.9 (*)    RDW 17.8 (*)    All other components within normal limits  GROUP A STREP BY PCR  BASIC METABOLIC PANEL  I-STAT TROPONIN, ED    EKG None  Radiology Dg Chest 2 View  Result Date: 05/30/2018 CLINICAL DATA:  Bilateral shoulder and back pain for 3 days. No known injury. Shortness of breath. EXAM: CHEST - 2 VIEW COMPARISON:  10/01/2017 and 05/11/2016. FINDINGS:  The heart size and mediastinal contours are normal. The lungs are clear. There is no pleural effusion or pneumothorax. No acute osseous findings are identified. IMPRESSION: No active cardiopulmonary process. Electronically Signed   By: Richardean Sale M.D.   On: 05/30/2018 14:30    Procedures Procedures (including critical care time)  Medications Ordered in ED Medications  oxyCODONE-acetaminophen (PERCOCET/ROXICET) 5-325 MG per tablet 1 tablet (has no administration in time range)  ondansetron (ZOFRAN-ODT) disintegrating tablet 4 mg (has no administration in time range)     Initial Impression / Assessment and Plan / ED Course  Triage vital signs and the nursing notes have been reviewed.  Pertinent labs & imaging results that were available during care of the patient were reviewed and considered in medical decision making (see chart for details).  Patient presents to the ED for 3 day history of bilateral shoulder, back and chest pain. She presents ill appearing and  laying on the bed in discomfort. She also endorses SOB with symptoms, but is able to phonate and breathe normally on initial assessment. No significant findings on physical exam. Shoulders are tender to palpation and with ROMs and initial cardio and pulmonary exams are also normal. Patient has a history of lupus which raises concern for more acute cardiac issues such as PE. Will start with typical chest pain work-up and re-evaluate patient.  Clinical Course as of May 31 842  Wed May 30, 2018  1502 EKG showed NSR. No ST elevations/depressions or signs of acute ischemia or infarct. This is reassuring in combination with negative troponin which assists in evaluating and ruling out an acute cardiac process. CXR normal.    [GM]  2119 Remaining labs unremarkable.   [GM]  1539 Patient re-evaluated and updated on lab work. She is grimacing and pain and endorses worsening shoulder and back pain. She is now hypertensive with BP of 164/116. BP at initial evaluation was lower. Case discussed with Dr. Charlesetta Shanks. Will order dissection study to evaluate for PE and dissection that could be causing pain.   [GM]  2051 Liver enzymes checked in July 2019. Will proceed with fluconazole.    [AM]    Clinical Course User Index [AM] Albesa Seen, PA-C [GM] Blanchard Mane Jonelle Sports, [place]   Final Clinical Impressions(s) / ED Diagnoses   Dispo: Case handed off to [facility], PA-C at shift change. Will defer treatment decision to following provider given results from imaging.  Final diagnoses:  None    ED Discharge Orders    None        Romie Jumper, [place] 05/31/18 [id number]    Charlesetta Shanks, MD 05/31/18 [id number]

## 2018-06-01 ENCOUNTER — Ambulatory Visit: Payer: Self-pay | Admitting: Gastroenterology

## 2018-06-01 ENCOUNTER — Encounter: Payer: Self-pay | Admitting: Gastroenterology

## 2018-06-01 VITALS — BP 110/58 | HR 86 | Ht 63.0 in | Wt 153.2 lb

## 2018-06-01 DIAGNOSIS — B370 Candidal stomatitis: Secondary | ICD-10-CM

## 2018-06-01 DIAGNOSIS — R1310 Dysphagia, unspecified: Secondary | ICD-10-CM | POA: Insufficient documentation

## 2018-06-01 HISTORY — DX: Candidal stomatitis: B37.0

## 2018-06-01 HISTORY — DX: Dysphagia, unspecified: R13.10

## 2018-06-01 MED ORDER — AMBULATORY NON FORMULARY MEDICATION: 0 refills | Status: DC

## 2018-06-01 NOTE — Progress Notes (Signed)
06/01/2018 [name] [name] [id number] 1996/05/24   HISTORY OF PRESENT ILLNESS: This is a 22 year old female who is new to our office.  She was referred here by Langston Masker, PA-C, from the emergency department for evaluation regarding difficult and painful swallowing.  The patient has lupus and has been on prednisone for a long time, currently at 10 mg.  She had previously been on methotrexate in the past.  She had sudden onset painful and difficult swallowing 1 week ago.  Never had anything similar to this in the past.  She went to the emergency department and they actually did a CT scan of her chest, abdomen, and pelvis, which was unrevealing.  They said it looked like she had thrush in her mouth so I placed her on Diflucan 200 mg daily for 14 days.  She has now been on that for 2 days.  So far no improvement in her symptoms.  She has really only been able to tolerate yogurt and juice.  Prior to this she denies any symptoms of heartburn or reflux.  She does admit that she gets a lot of vaginal yeast infections.  They also gave her Carafate suspension, but it was very expensive so she did not get that.  Past Medical History:  Diagnosis Date  . Gout   . Hypertension   . Lupus ([place])   . Raynaud disease    Past Surgical History:  Procedure Laterality Date  . ORIF SHOULDER FRACTURE Left   . TONSILLECTOMY      reports that she has never smoked. She has never used smokeless tobacco. She reports that she does not drink alcohol or use drugs. family history includes Aneurysm in her mother; Heart attack in her paternal uncle; Lung cancer in her maternal aunt; Seizures in her sister; Stroke in her sister. Allergies  Allergen Reactions  . Hydroxychloroquine Rash  . Latex Rash      Outpatient Encounter Medications as of 06/01/2018  Medication Sig  . ferrous sulfate 325 (65 FE) MG tablet Take 1 tablet (325 mg total) by mouth 2 (two) times daily. With 1/2 glass of orange juice  . fluconazole  (DIFLUCAN) 200 MG tablet Take 1 tablet (200 mg total) by mouth daily for 14 days.  . folic acid (FOLVITE) 1 MG tablet TAKE 2 TABLETS (2 MG TOTAL) BY MOUTH DAILY.  Marland Kitchen HYDROcodone-acetaminophen (NORCO/VICODIN) 5-325 MG tablet Take 1-2 tablets by mouth every 6 (six) hours as needed.  Marland Kitchen lisinopril (PRINIVIL,ZESTRIL) 5 MG tablet Take 1 tablet (5 mg total) by mouth daily.  . pantoprazole (PROTONIX) 20 MG tablet Take 1 tablet (20 mg total) by mouth daily.  . predniSONE (DELTASONE) 5 MG tablet Take 10 mg by mouth daily.   . Vitamin D, Ergocalciferol, (DRISDOL) 50000 units CAPS capsule Take 1 capsule (50,000 Units total) by mouth 2 (two) times a week. (Patient taking differently: Take 50,000 Units by mouth every 7 (seven) days. )  . AMBULATORY NON FORMULARY MEDICATION Medication Name: GI Cocktail 90 ml- viscvous lidocaine 2 % 90 ml Dicyclomine 10 / 5 ml 270 Maalox Total ml's is 450 ml's Take 30 cc's by mouth every 4-6 hours.  . [DISCONTINUED] sucralfate (CARAFATE) 1 GM/10ML suspension Take 10 mLs (1 g total) by mouth 4 (four) times daily -  with meals and at bedtime.   No facility-administered encounter medications on file as of 06/01/2018.      REVIEW OF SYSTEMS  : All other systems reviewed and negative except where noted  in the History of Present Illness.   PHYSICAL EXAM: BP (!) 110/58   Pulse 86 Comment: irregular  Ht 5\' 3"  (1.6 m)   Wt 153 lb 3.2 oz (69.5 kg)   LMP 05/30/2018   BMI 27.14 kg/m  General: Well developed black female in no acute distress Head: Normocephalic and atraumatic Eyes:  Sclerae anicteric, conjunctiva pink. Ears: Normal auditory acuity Mouth:  Appears to have thrush with white patches on her tongue. Lungs: Clear throughout to auscultation; no increased WOB. Heart: Regular rate and rhythm; no M/RG Abdomen: Soft, non-distended.  BS present.  Mild epigastric TTP. Musculoskeletal: Symmetrical with no gross deformities  Skin: No lesions on visible  extremities Extremities: No edema  Neurological: Alert oriented x 4, grossly non-focal Psychological:  Alert and cooperative. Normal mood and affect  ASSESSMENT AND PLAN: *Dysphagia/odynophagia:  This was sudden in onset one week ago.  Appears to have thrush.  ? Esophageal candidiasis.  Was started on diflucan 200 mg daily just two days ago, but no improvement so far.  She has lupus and is on chronic prednisone 10 mg daily.  Will plan for EGD next week with Dr. Ardis Hughs.  Will try to get GI cocktail as well to see if this helps symptomatically.  **The risks, benefits, and alternatives to EGD were discussed with the patient and she consents to proceed.   CC:  Albesa Seen, PA-C

## 2018-06-01 NOTE — Patient Instructions (Addendum)
You have been scheduled for an endoscopy. Please follow written instructions given to you at your visit today. If you use inhalers (even only as needed), please bring them with you on the day of your procedure. Your physician has requested that you go to www.startemmi.com and enter the access code given to you at your visit today. This web site gives a general overview about your procedure. However, you should still follow specific instructions given to you by our office regarding your preparation for the procedure.   [facility] has the prescription on hold for you. You can call them at [phone], Hoyle Sauer is the pharmacist.  The liquid is called, GI Cocktail. The cash price is 69.99.

## 2018-06-03 NOTE — Progress Notes (Signed)
I agree with the above note, plan 

## 2018-06-05 ENCOUNTER — Encounter: Payer: Self-pay | Admitting: Gastroenterology

## 2018-06-05 ENCOUNTER — Inpatient Hospital Stay (HOSPITAL_COMMUNITY)
Admission: EM | Admit: 2018-06-05 | Discharge: 2018-06-09 | DRG: 194 | Disposition: A | Discharge status: Home / Self Care | Payer: Self-pay | Attending: Internal Medicine | Admitting: Internal Medicine

## 2018-06-05 ENCOUNTER — Observation Stay (HOSPITAL_COMMUNITY): Payer: Self-pay

## 2018-06-05 ENCOUNTER — Other Ambulatory Visit: Payer: Self-pay

## 2018-06-05 ENCOUNTER — Ambulatory Visit (AMBULATORY_SURGERY_CENTER): Payer: Self-pay | Admitting: Gastroenterology

## 2018-06-05 ENCOUNTER — Emergency Department (HOSPITAL_COMMUNITY): Payer: Self-pay

## 2018-06-05 ENCOUNTER — Encounter (HOSPITAL_COMMUNITY): Payer: Self-pay

## 2018-06-05 VITALS — BP 114/64 | HR 121 | Temp 99.1°F | Resp 23 | Ht 63.0 in | Wt 153.0 lb

## 2018-06-05 DIAGNOSIS — K121 Other forms of stomatitis: Secondary | ICD-10-CM

## 2018-06-05 DIAGNOSIS — R0781 Pleurodynia: Secondary | ICD-10-CM | POA: Diagnosis present

## 2018-06-05 DIAGNOSIS — R509 Fever, unspecified: Secondary | ICD-10-CM | POA: Diagnosis present

## 2018-06-05 DIAGNOSIS — R079 Chest pain, unspecified: Secondary | ICD-10-CM

## 2018-06-05 DIAGNOSIS — R1310 Dysphagia, unspecified: Secondary | ICD-10-CM

## 2018-06-05 DIAGNOSIS — Z7952 Long term (current) use of systemic steroids: Secondary | ICD-10-CM

## 2018-06-05 DIAGNOSIS — M329 Systemic lupus erythematosus, unspecified: Secondary | ICD-10-CM | POA: Diagnosis present

## 2018-06-05 DIAGNOSIS — R0789 Other chest pain: Secondary | ICD-10-CM

## 2018-06-05 DIAGNOSIS — J181 Lobar pneumonia, unspecified organism: Principal | ICD-10-CM

## 2018-06-05 DIAGNOSIS — E162 Hypoglycemia, unspecified: Secondary | ICD-10-CM | POA: Diagnosis present

## 2018-06-05 DIAGNOSIS — R000 Tachycardia, unspecified: Secondary | ICD-10-CM

## 2018-06-05 DIAGNOSIS — I7300 Raynaud's syndrome without gangrene: Secondary | ICD-10-CM | POA: Diagnosis present

## 2018-06-05 DIAGNOSIS — K1230 Oral mucositis (ulcerative), unspecified: Secondary | ICD-10-CM | POA: Diagnosis present

## 2018-06-05 DIAGNOSIS — M109 Gout, unspecified: Secondary | ICD-10-CM | POA: Diagnosis present

## 2018-06-05 DIAGNOSIS — Z888 Allergy status to other drugs, medicaments and biological substances status: Secondary | ICD-10-CM

## 2018-06-05 DIAGNOSIS — I10 Essential (primary) hypertension: Secondary | ICD-10-CM | POA: Diagnosis present

## 2018-06-05 DIAGNOSIS — B370 Candidal stomatitis: Secondary | ICD-10-CM | POA: Diagnosis present

## 2018-06-05 DIAGNOSIS — Z91040 Latex allergy status: Secondary | ICD-10-CM

## 2018-06-05 DIAGNOSIS — G8929 Other chronic pain: Secondary | ICD-10-CM | POA: Diagnosis present

## 2018-06-05 DIAGNOSIS — M25512 Pain in left shoulder: Secondary | ICD-10-CM

## 2018-06-05 DIAGNOSIS — D509 Iron deficiency anemia, unspecified: Secondary | ICD-10-CM | POA: Diagnosis present

## 2018-06-05 DIAGNOSIS — Z79899 Other long term (current) drug therapy: Secondary | ICD-10-CM

## 2018-06-05 HISTORY — DX: Pleurodynia: R07.81

## 2018-06-05 LAB — CBG MONITORING, ED
GLUCOSE-CAPILLARY: 21 mg/dL — AB (ref 70–99)
GLUCOSE-CAPILLARY: 82 mg/dL (ref 70–99)
Glucose-Capillary: 163 mg/dL — ABNORMAL HIGH (ref 70–99)
Glucose-Capillary: 88 mg/dL (ref 70–99)

## 2018-06-05 LAB — PROCALCITONIN: Procalcitonin: 0.17 ng/mL

## 2018-06-05 LAB — COMPREHENSIVE METABOLIC PANEL
ALK PHOS: 49 U/L (ref 38–126)
ALT: 32 U/L (ref 0–44)
ANION GAP: 10 (ref 5–15)
AST: 72 U/L — ABNORMAL HIGH (ref 15–41)
Albumin: 2.4 g/dL — ABNORMAL LOW (ref 3.5–5.0)
BUN: 12 mg/dL (ref 6–20)
CHLORIDE: 105 mmol/L (ref 98–111)
CO2: 20 mmol/L — AB (ref 22–32)
Calcium: 7.9 mg/dL — ABNORMAL LOW (ref 8.9–10.3)
Creatinine, Ser: 0.84 mg/dL (ref 0.44–1.00)
GFR calc non Af Amer: >60 mL/min (ref >60)
GLUCOSE: 228 mg/dL — AB (ref 70–99)
POTASSIUM: 3.8 mmol/L (ref 3.5–5.1)
SODIUM: 135 mmol/L (ref 135–145)
Total Bilirubin: 0.7 mg/dL (ref 0.3–1.2)
Total Protein: 6.4 g/dL — ABNORMAL LOW (ref 6.5–8.1)

## 2018-06-05 LAB — CBC WITH DIFFERENTIAL/PLATELET
BASOS ABS: 0 10*3/uL (ref 0.0–0.1)
Basophils Relative: 1 %
EOS PCT: 1 %
Eosinophils Absolute: 0 10*3/uL (ref 0.0–0.7)
HCT: 30.2 % — ABNORMAL LOW (ref 36.0–46.0)
HEMOGLOBIN: 9.1 g/dL — AB (ref 12.0–15.0)
Lymphocytes Relative: 21 %
Lymphs Abs: 0.6 10*3/uL — ABNORMAL LOW (ref 0.7–4.0)
MCH: 24.3 pg — ABNORMAL LOW (ref 26.0–34.0)
MCHC: 30.1 g/dL (ref 30.0–36.0)
MCV: 80.5 fL (ref 78.0–100.0)
MONOS PCT: 4 %
Monocytes Absolute: 0.1 10*3/uL (ref 0.1–1.0)
NEUTROS PCT: 73 %
Neutro Abs: 2.1 10*3/uL (ref 1.7–7.7)
Platelets: 195 10*3/uL (ref 150–400)
RBC: 3.75 MIL/uL — ABNORMAL LOW (ref 3.87–5.11)
RDW: 18 % — ABNORMAL HIGH (ref 11.5–15.5)
WBC: 2.8 10*3/uL — AB (ref 4.0–10.5)

## 2018-06-05 LAB — PROTIME-INR
INR: 0.94
PROTHROMBIN TIME: 12.4 s (ref 11.4–15.2)

## 2018-06-05 LAB — LACTIC ACID, PLASMA: LACTIC ACID, VENOUS: 1.2 mmol/L (ref 0.5–1.9)

## 2018-06-05 LAB — APTT: APTT: 29 s (ref 24–36)

## 2018-06-05 LAB — MAGNESIUM: Magnesium: 1.7 mg/dL (ref 1.7–2.4)

## 2018-06-05 LAB — I-STAT BETA HCG BLOOD, ED (MC, WL, AP ONLY): I-stat hCG, quantitative: <5 m[IU]/mL (ref <5)

## 2018-06-05 LAB — TROPONIN I: Troponin I: <0.03 ng/mL (ref <0.03)

## 2018-06-05 MED ORDER — FLUCONAZOLE 100 MG PO TABS
200.0000 mg | ORAL_TABLET | Freq: Every day | ORAL | Status: DC
  Administered 2018-06-06 – 2018-06-09 (×4): 200 mg via ORAL
  Filled 2018-06-05 (×5): qty 2

## 2018-06-05 MED ORDER — SODIUM CHLORIDE 0.9 % IV BOLUS
1000.0000 mL | Freq: Once | INTRAVENOUS | Status: AC
  Administered 2018-06-05: 1000 mL via INTRAVENOUS

## 2018-06-05 MED ORDER — AMLODIPINE BESYLATE 5 MG PO TABS
2.5000 mg | ORAL_TABLET | Freq: Every day | ORAL | Status: DC
  Administered 2018-06-06 – 2018-06-09 (×4): 2.5 mg via ORAL
  Filled 2018-06-05 (×4): qty 1

## 2018-06-05 MED ORDER — IOPAMIDOL (ISOVUE-370) INJECTION 76%
100.0000 mL | Freq: Once | INTRAVENOUS | Status: AC | PRN
  Administered 2018-06-05: 100 mL via INTRAVENOUS

## 2018-06-05 MED ORDER — PANTOPRAZOLE SODIUM 20 MG PO TBEC
20.0000 mg | DELAYED_RELEASE_TABLET | Freq: Every day | ORAL | Status: DC
  Administered 2018-06-06 – 2018-06-09 (×4): 20 mg via ORAL
  Filled 2018-06-05 (×4): qty 1

## 2018-06-05 MED ORDER — ACETAMINOPHEN 325 MG PO TABS
650.0000 mg | ORAL_TABLET | Freq: Four times a day (QID) | ORAL | Status: DC | PRN
  Administered 2018-06-05 – 2018-06-07 (×4): 650 mg via ORAL
  Filled 2018-06-05 (×4): qty 2

## 2018-06-05 MED ORDER — PREDNISONE 10 MG PO TABS
10.0000 mg | ORAL_TABLET | Freq: Every day | ORAL | Status: DC
  Administered 2018-06-06 – 2018-06-09 (×4): 10 mg via ORAL
  Filled 2018-06-05 (×4): qty 1

## 2018-06-05 MED ORDER — SODIUM CHLORIDE 0.9 % IV SOLN
INTRAVENOUS | Status: DC
  Administered 2018-06-05 – 2018-06-09 (×9): via INTRAVENOUS

## 2018-06-05 MED ORDER — GI COCKTAIL ~~LOC~~
30.0000 mL | Freq: Once | ORAL | Status: AC
  Administered 2018-06-05: 30 mL via ORAL
  Filled 2018-06-05: qty 30

## 2018-06-05 MED ORDER — HYDROCODONE-ACETAMINOPHEN 5-325 MG PO TABS
1.0000 | ORAL_TABLET | Freq: Four times a day (QID) | ORAL | Status: DC | PRN
  Administered 2018-06-06 – 2018-06-07 (×3): 1 via ORAL
  Administered 2018-06-07 – 2018-06-08 (×4): 2 via ORAL
  Filled 2018-06-05 (×2): qty 1
  Filled 2018-06-05: qty 2
  Filled 2018-06-05: qty 1
  Filled 2018-06-05 (×3): qty 2

## 2018-06-05 MED ORDER — FENTANYL CITRATE (PF) 100 MCG/2ML IJ SOLN
50.0000 ug | Freq: Once | INTRAMUSCULAR | Status: DC
  Filled 2018-06-05: qty 2

## 2018-06-05 MED ORDER — ACETAMINOPHEN 650 MG RE SUPP: 650.0000 mg | Freq: Four times a day (QID) | RECTAL | Status: DC | PRN

## 2018-06-05 MED ORDER — DEXTROSE 50 % IV SOLN
1.0000 | Freq: Once | INTRAVENOUS | Status: AC
  Administered 2018-06-05: 50 mL via INTRAVENOUS

## 2018-06-05 MED ORDER — SODIUM CHLORIDE 0.9 % IV SOLN: 500.0000 mL | Freq: Once | INTRAVENOUS | Status: DC

## 2018-06-05 MED ORDER — ONDANSETRON HCL 4 MG PO TABS: 4.0000 mg | ORAL_TABLET | Freq: Four times a day (QID) | ORAL | Status: DC | PRN

## 2018-06-05 MED ORDER — DEXTROSE 50 % IV SOLN
INTRAVENOUS | Status: AC
  Filled 2018-06-05: qty 50

## 2018-06-05 MED ORDER — SODIUM CHLORIDE 0.9 % IV SOLN
500.0000 mg | INTRAVENOUS | Status: DC
  Administered 2018-06-05: 500 mg via INTRAVENOUS
  Filled 2018-06-05 (×2): qty 500

## 2018-06-05 MED ORDER — IOPAMIDOL (ISOVUE-370) INJECTION 76%
INTRAVENOUS | Status: AC
  Filled 2018-06-05: qty 100

## 2018-06-05 MED ORDER — ONDANSETRON HCL 4 MG/2ML IJ SOLN
4.0000 mg | Freq: Four times a day (QID) | INTRAMUSCULAR | Status: DC | PRN
  Administered 2018-06-07: 4 mg via INTRAVENOUS
  Filled 2018-06-05: qty 2

## 2018-06-05 MED ORDER — SODIUM CHLORIDE 0.9 % IV SOLN
2.0000 g | INTRAVENOUS | Status: DC
  Administered 2018-06-05 – 2018-06-08 (×4): 2 g via INTRAVENOUS
  Filled 2018-06-05 (×6): qty 20

## 2018-06-05 MED ORDER — ENOXAPARIN SODIUM 40 MG/0.4ML ~~LOC~~ SOLN
40.0000 mg | SUBCUTANEOUS | Status: DC
  Administered 2018-06-05 – 2018-06-08 (×4): 40 mg via SUBCUTANEOUS
  Filled 2018-06-05 (×4): qty 0.4

## 2018-06-05 MED ORDER — FENTANYL CITRATE (PF) 100 MCG/2ML IJ SOLN
50.0000 ug | Freq: Once | INTRAMUSCULAR | Status: AC
  Administered 2018-06-05: 50 ug via INTRAVENOUS

## 2018-06-05 MED ORDER — SODIUM CHLORIDE 0.9% FLUSH: 3.0000 mL | Freq: Two times a day (BID) | INTRAVENOUS | Status: DC

## 2018-06-05 MED ORDER — LIDOCAINE VISCOUS HCL 2 % MT SOLN
15.0000 mL | OROMUCOSAL | Status: DC | PRN
  Administered 2018-06-06 – 2018-06-07 (×2): 15 mL via OROMUCOSAL
  Filled 2018-06-05 (×4): qty 15

## 2018-06-05 NOTE — ED Notes (Signed)
Admitting at bedside 

## 2018-06-05 NOTE — ED Notes (Signed)
Patient's mother at bedside.

## 2018-06-05 NOTE — Progress Notes (Addendum)
During pre-procedure interview, pt HR noted to be 126. Pt states that her HR is always this fast. Denies CP or SOB at this time. Reports sore throat and mouth.   (Post-timed note, written after procedure end)

## 2018-06-05 NOTE — H&P (Signed)
History and Physical    [name] [name] NOB:[id number] DOB: 1996-06-22 DOA: 06/05/2018  PCP: [name], FNP Consultants:  Elwyn Reach; Lysle Rubens - rheumatology Patient coming from:  Home - lives with sister; Donald Prose: Laurita Quint, [phone]; [phone]  Chief Complaint: chest pain, mouth pain  HPI: [name] [name] is a 22 y.o. female with medical history significant of SLE; HTN; and Raynaud's presenting with multiple symptoms after EGD today.  She went to [facility] with chest pain and "nerve damage and stuff."  She broke out with a rash.  She had an appointment with GI today for EGD.  Her chest was hurting.  Her sugar was too low.  She has SLE and has continuous symptoms, but the chest pain is new - started today, but she had "chest pain but it was like shortness of breath" but today "it'sl like aching, burning, and shortness of breath."  +cough, productive of yellow sputum for about 2 weeks.  No fever.  She has tachycardia today, unsure if this is new.  +SOB.  She has leg numbness.  She feels like her tongue is swollen.    In review of note from 9/18 at [facility], the patient had no tachycardia.  She had a negative CTA of chest/abdomen/pelvis and had "brief symptomatic improvement with GI cocktail."  She was given oral fluconazole pending GI consult due to concern for candidal esophagitis.  She was also treated with PPI and Carafate.  EGD today showed 3 shallow ulcers on the hard palate of the oropharynx and was otherwise normal.     ED Course:  Chest pain, tachycardia, hypoglycemia.  Patient has chronic mouth and chest pain, on prednisone daily.  She had EGD today, increased pain vs. Usual.  Persistent tachycardia but this appears to be chronic.  Hypoglycemia, persistent but better after D50 x 2.  Hard palate ulcers.  Review of Systems: As per HPI; otherwise review of systems reviewed and negative.   Ambulatory Status:   Ambulates without assistance  Past Medical History:  Diagnosis Date  . Gout   .  Hypertension   . Lupus ([place])   . Raynaud disease     Past Surgical History:  Procedure Laterality Date  . ORIF SHOULDER FRACTURE Left   . TONSILLECTOMY      Social History   Socioeconomic History  . Marital status: Single    Spouse name: Not on file  . Number of children: Not on file  . Years of education: Not on file  . Highest education level: Not on file  Occupational History  . Not on file  Social Needs  . Financial resource strain: Not on file  . Food insecurity:    Worry: Not on file    Inability: Not on file  . Transportation needs:    Medical: Not on file    Non-medical: Not on file  Tobacco Use  . Smoking status: Never Smoker  . Smokeless tobacco: Never Used  Substance and Sexual Activity  . Alcohol use: No  . Drug use: No  . Sexual activity: Not Currently  Lifestyle  . Physical activity:    Days per week: Not on file    Minutes per session: Not on file  . Stress: Not on file  Relationships  . Social connections:    Talks on phone: Not on file    Gets together: Not on file    Attends religious service: Not on file    Active member of club or organization: Not on  file    Attends meetings of clubs or organizations: Not on file    Relationship status: Not on file  . Intimate partner violence:    Fear of current or ex partner: Not on file    Emotionally abused: Not on file    Physically abused: Not on file    Forced sexual activity: Not on file  Other Topics Concern  . Not on file  Social History Narrative  . Not on file    Allergies  Allergen Reactions  . Hydroxychloroquine Rash  . Latex Rash    Family History  Problem Relation Age of Onset  . Aneurysm Mother   . Stroke Sister   . Seizures Sister   . Lung cancer Maternal Aunt   . Heart attack Paternal Uncle   . Colon cancer Neg Hx   . Esophageal cancer Neg Hx   . Pancreatic cancer Neg Hx   . Stomach cancer Neg Hx     Prior to Admission medications   Medication Sig Start Date End  Date Taking? Authorizing Provider  AMBULATORY NON FORMULARY MEDICATION Medication Name: GI Cocktail 90 ml- viscvous lidocaine 2 % 90 ml Dicyclomine 10 / 5 ml 270 Maalox Total ml's is 450 ml's Take 30 cc's by mouth every 4-6 hours. Patient not taking: Reported on 06/05/2018 06/01/18   Zehr, Janett Billow D, PA-C  ferrous sulfate 325 (65 FE) MG tablet Take 1 tablet (325 mg total) by mouth 2 (two) times daily. With 1/2 glass of orange juice 12/15/15   Davonna Belling, MD  fluconazole (DIFLUCAN) 200 MG tablet Take 1 tablet (200 mg total) by mouth daily for 14 days. Patient not taking: Reported on 06/05/2018 05/30/18 06/13/18  Langston Masker B, PA-C  folic acid (FOLVITE) 1 MG tablet TAKE 2 TABLETS (2 MG TOTAL) BY MOUTH DAILY. 12/11/17   Bo Merino, MD  HYDROcodone-acetaminophen (NORCO/VICODIN) 5-325 MG tablet Take 1-2 tablets by mouth every 6 (six) hours as needed. Patient not taking: Reported on 06/05/2018 05/30/18   Langston Masker B, PA-C  lisinopril (PRINIVIL,ZESTRIL) 5 MG tablet Take 1 tablet (5 mg total) by mouth daily. 03/28/18   [name], FNP  pantoprazole (PROTONIX) 20 MG tablet Take 1 tablet (20 mg total) by mouth daily. 05/30/18 06/29/18  Langston Masker B, PA-C  predniSONE (DELTASONE) 5 MG tablet Take 10 mg by mouth daily.     [provider]  Vitamin D, Ergocalciferol, (DRISDOL) 50000 units CAPS capsule Take 1 capsule (50,000 Units total) by mouth 2 (two) times a week. Patient taking differently: Take 50,000 Units by mouth every 7 (seven) days.  12/21/17   Bo Merino, MD    Physical Exam: Vitals:   06/05/18 1108 06/05/18 1115 06/05/18 1130 06/05/18 1230  BP: 101/89 124/88 (!) 141/87 132/84  Pulse: 84 (!) 120 (!) 120 (!) 118  Resp: (!) 22 20 (!) 29 18  Temp: 99.6 F (37.6 C)     TempSrc: Oral     SpO2: 100% 97% 96% 99%     General:  Appears calm and comfortable and is NAD Eyes:  PERRL, EOMI, normal lids, iris ENT:  grossly normal hearing, lips & tongue, mmm;  appropriate dentition with braces in place Neck:  no LAD, masses or thyromegaly; no carotid bruits Cardiovascular:  RRR, no m/r/g. No LE edema. +TTP on left chest wall. Respiratory:   CTA bilaterally with no wheezes/rales/rhonchi.  Normal respiratory effort on room air. Abdomen:  soft, NT, ND, NABS Back:   normal alignment, no CVAT Skin:  no rash or induration seen on limited exam Musculoskeletal:  grossly normal tone BUE/BLE, good ROM, no bony abnormality Lower extremity:  No LE edema. 2+ distal pulses.  She c/o R calf TTP with +squeeze and Homan's, although no obvious erythema, edema, or warmth. Psychiatric: blunted mood and affect, speech fluent and appropriate, AOx3 Neurologic:  CN 2-12 grossly intact, moves all extremities in coordinated fashion, sensation intact    Radiological Exams on Admission: Dg Chest Portable 1 View  Result Date: 06/05/2018 CLINICAL DATA:  Short of breath, left chest pain, some right arm numbness today, history of lupus EXAM: PORTABLE CHEST 1 VIEW COMPARISON:  CT chest of 05/30/2017 and chest x-ray of the same day FINDINGS: There is parenchymal opacity medially at the left lung base with some elevation of the left hemidiaphragm. This opacity may be due to atelectasis, but developing known pneumonia cannot be excluded. Follow-up is recommended if clinically warranted. The right lung appears clear. The heart is within upper limits of normal. No bony abnormality is seen. IMPRESSION: Prominent markings medially at the left lung base may be due to atelectasis, but developing pneumonia cannot be excluded. Consider follow-up if warranted clinically. Electronically Signed   By: Ivar Drape M.D.   On: 06/05/2018 11:52    EKG: Independently reviewed.  Sinus tachycardia with rate 123; nonspecific ST changes that are likely rate-related   Labs on Admission: I have personally reviewed the available labs and imaging studies at the time of the admission.  Pertinent labs:    CO2 20 Glucose <10, 21, 228, 163, 88, 82 Albumin 2.4 AST 72/ALT 32/GFR >60 Troponin <0.03 WBC 2.8 Hgb 9.1 INR 0.94 HCG <5  Assessment/Plan Principal Problem:   Tachycardia Active Problems:   SLE (systemic lupus erythematosus) (HCC)   Mucositis oral   Dysphagia   Pleuritic chest pain   Hypoglycemia without diagnosis of diabetes mellitus   Tachycardia -Despite recent (9/18) negative CTA, she is presenting with tachycardia and SOB with a h/o a hypercoagulable condition (SLE) -As such, while a D-dimer was considered, after discussion with the patient and her sister, it seems reasonable to once again perform a CTA to r/o PE by the Well's criteria -In review of prior EKGs and last visit to [facility], the tachycardia does not appear to be chronic -There is little evidence of infection at this time, but will await results of CTA to determine if antibiotic coverage is appropriate since the CXR was indeterminate -Will   Dysphagia related to mucositis -Based on her ability to swallow well enough to remain hydrated, it is reasonable to defer outpatient ID evaluation (Dr. Johnnye Sima did come by to see the patient briefly and agrees) -The pictures from her procedure today are available in Epic and to me this appears more consistent with a rheumatologic (Behcet's-type condition) rather than an infectious one (such as herpes) -Will continue Diflucan for now -Will order viscous lidocaine q4h prn -Consider outpatient ENT evaluation vs. Rheumatology f/u -Will give IVF overnight given borderline hypotension on presentation with persistent tachycardia and monitor on telemetry  Chest pain -While she is technically hypercoagulable and so at increased risk for CAD, she has pleuritic and reproducible chest pain along the left chest wall -She is also 22yo with only HTN as a probably CVD RF -Low suspicion for ACS with negative 1-time troponin -Will not continue to trend at this time unless telemetry changes  are noted or chest pain recurs/worsens/changes  Hypoglycemia -Unusual given her lack of DM -Possibly related to anesthesia -She  has improved -Will continue to trend q4h overnight  SLE -Continue prednisone -Continue with outpatient rheum f/u   DVT prophylaxis:  Lovenox Code Status:  Full  Family Communication: Sister present throughout evaluation Disposition Plan:  Home once clinically improved Consults called: ID (telephone only)  Admission status: It is my clinical opinion that referral for OBSERVATION is reasonable and necessary in this patient based on the above information provided. The aforementioned taken together are felt to place the patient at high risk for further clinical deterioration. However it is anticipated that the patient may be medically stable for discharge from the hospital within 24 to 48 hours.    Karmen Bongo MD [facility]  If note is complete, please contact covering daytime or nighttime physician. [email or web address] Password [facility]  06/05/2018, 4:03 PM

## 2018-06-05 NOTE — Progress Notes (Signed)
CTA negative for PE but again shows possible developing PNA.  Patient now has fever to 102.3 with tachycardia.  Will implement sepsis protocol.  She has been started on treatment for CAP with Rocephin/Azithromycin.  She is not hypotensive at this time and so will not bolus.  If lactate is >4, she will need the full 30 cc/kg bolus and would likely need broadening of her antibiotics.  Procalcitonin is also pending.  Blood cultures are ordered.  Will also request UA and urine culture.   Carlyon Shadow, M.D.

## 2018-06-05 NOTE — ED Notes (Addendum)
Dextrose 50 given, repeat CBG 20

## 2018-06-05 NOTE — Progress Notes (Signed)
See my note from MD Evaluation Post Procedure

## 2018-06-05 NOTE — ED Notes (Signed)
Pt c/o feeling cold.  This RN noted pt's extremities to be warm.  Oral temp taken, 100.5

## 2018-06-05 NOTE — Patient Instructions (Signed)
YOU HAD AN ENDOSCOPIC PROCEDURE TODAY AT [facility]:   Refer to the procedure report that was given to you for any specific questions about what was found during the examination.  If the procedure report does not answer your questions, please call your gastroenterologist to clarify.  If you requested that your care partner not be given the details of your procedure findings, then the procedure report has been included in a sealed envelope for you to review at your convenience later.  YOU SHOULD EXPECT: Some feelings of bloating in the abdomen. Passage of more gas than usual.  Walking can help get rid of the air that was put into your GI tract during the procedure and reduce the bloating. If you had a lower endoscopy (such as a colonoscopy or flexible sigmoidoscopy) you may notice spotting of blood in your stool or on the toilet paper. If you underwent a bowel prep for your procedure, you may not have a normal bowel movement for a few days.  Please Note:  You might notice some irritation and congestion in your nose or some drainage.  This is from the oxygen used during your procedure.  There is no need for concern and it should clear up in a day or so.  SYMPTOMS TO REPORT IMMEDIATELY:    Following upper endoscopy (EGD)  Vomiting of blood or coffee ground material  New chest pain or pain under the shoulder blades  Painful or persistently difficult swallowing  New shortness of breath  Fever of 100F or higher  Black, tarry-looking stools  For urgent or emergent issues, a gastroenterologist can be reached at any hour by calling [phone].   DIET:  We do recommend a small meal at first, but then you may proceed to your regular diet.  Drink plenty of fluids but you should avoid alcoholic beverages for 24 hours.  ACTIVITY:  You should plan to take it easy for the rest of today and you should NOT DRIVE or use heavy machinery until tomorrow (because of the sedation medicines used  during the test).    FOLLOW UP: Our staff will call the number listed on your records the next business day following your procedure to check on you and address any questions or concerns that you may have regarding the information given to you following your procedure. If we do not reach you, we will leave a message.  However, if you are feeling well and you are not experiencing any problems, there is no need to return our call.  We will assume that you have returned to your regular daily activities without incident.  If any biopsies were taken you will be contacted by phone or by letter within the next 1-3 weeks.  Please call us at [phone] if you have not heard about the biopsies in 3 weeks.    SIGNATURES/CONFIDENTIALITY: You and/or your care partner have signed paperwork which will be entered into your electronic medical record.  These signatures attest to the fact that that the information above on your After Visit Summary has been reviewed and is understood.  Full responsibility of the confidentiality of this discharge information lies with you and/or your care-partner.  Read all handouts given to you by your recovery room nurse.  Infectious disease clinic will call you or our office will call

## 2018-06-05 NOTE — Progress Notes (Signed)
Spontaneous respirations throughout. VSS. Resting comfortably. To PACU on room air. Report to  RN. 

## 2018-06-05 NOTE — Op Note (Signed)
[facility] Patient Name: [name] [name] Procedure Date: 06/05/2018 9:20 AM MRN: [id number] Endoscopist: Milus Banister , MD Age: 22 Referring MD:  Date of Birth: 1996/06/22 Gender: Female Account #: [id number] Procedure:                Upper GI endoscopy Indications:              Dysphagia, Odynophagia; chronic immune supression. Medicines:                Monitored Anesthesia Care Procedure:                Pre-Anesthesia Assessment:                           - Prior to the procedure, a History and Physical                            was performed, and patient medications and                            allergies were reviewed. The patient's tolerance of                            previous anesthesia was also reviewed. The risks                            and benefits of the procedure and the sedation                            options and risks were discussed with the patient.                            All questions were answered, and informed consent                            was obtained. Prior Anticoagulants: The patient has                            taken no previous anticoagulant or antiplatelet                            agents. ASA Grade Assessment: III - A patient with                            severe systemic disease. After reviewing the risks                            and benefits, the patient was deemed in                            satisfactory condition to undergo the procedure.                           After obtaining informed consent, the endoscope was  passed under direct vision. Throughout the                            procedure, the patient's blood pressure, pulse, and                            oxygen saturations were monitored continuously. The                            Model GIF-[id number]) scope was introduced                            through the mouth, and advanced to the second part                            of  duodenum. The upper GI endoscopy was                            accomplished without difficulty. The patient                            tolerated the procedure well. Scope In: Scope Out: Findings:                 Three shallow ulcers on the hard palate of                            oropharynx. Blood tinged mucous in the oropharynx.                            These were not biospied as that is not feasible                            during EGD.                           The esophagus was normal.                           The stomach was normal.                           The examined duodenum was normal. Complications:            No immediate complications. Estimated blood loss:                            None. Estimated Blood Loss:     Estimated blood loss was minimal. Impression:               - Three shallow ulcers on the hard palate of                            oropharynx. Blood tinged mucous in the oropharynx.  These are almost certainly the cause of her                            swallowing troubles. No esophageal candidiasis                            noted, no thrush in oropharynx. Recommendation:           - Patient has a contact number available for                            emergencies. The signs and symptoms of potential                            delayed complications were discussed with the                            patient. Return to normal activities tomorrow.                            Written discharge instructions were provided to the                            patient.                           - Resume previous diet.                           - Continue present medications. Complete your                            diflucan course for now.                           - My office will refer you to ID clinic for oral                            ulcers, expedited referral if possible. Milus Banister, MD 06/05/2018 9:40:05 AM This report has  been signed electronically.

## 2018-06-05 NOTE — ED Provider Notes (Addendum)
[facility] EMERGENCY DEPARTMENT Provider Note   CSN: [id number] Arrival date & time: 06/05/18  1059     History   Chief Complaint Chief Complaint  Patient presents with  . Chest Pain    HPI [name] [name] is a 22 y.o. female.  HPI Presents with concern of chest pain per Patient has multiple medical issues, most notably lupus, Raynaud's. Patient is on chronic steroids, has been for approximately 5 years. Patient has ongoing tachycardia and chest pain. Today, the patient had planned endoscopy.  When she notes that upon awakening she felt more severe pain than usual though in a typical distribution, sternal and left-sided. She is here with her sister who assists with the HPI.   Past Medical History:  Diagnosis Date  . Gout   . Hypertension   . Lupus ([place])   . Raynaud disease     Patient Active Problem List   Diagnosis Date Noted  . Tachycardia 06/05/2018  . Dysphagia 06/01/2018  . Odynophagia 06/01/2018  . Thrush 06/01/2018  . Mucositis oral 01/12/2018  . Vaginal candidiasis 01/12/2018  . Methotrexate toxicity   . Mouth ulcers   . Leukopenia 01/10/2018  . Accidental methotrexate overdose 01/08/2018  . Depression 02/08/2017  . Primary osteoarthritis of both knees 11/09/2016  . Chronic midline low back pain without sciatica 11/09/2016  . Vitamin D deficiency 11/09/2016  . SLE (systemic lupus erythematosus) ([place]) 09/19/2016  . High risk medication use 09/19/2016  . Insomnia 01/29/2016  . Depo-Provera contraceptive status 08/11/2015  . Raynaud's disease 08/11/2015  . Seasonal allergies 08/11/2015  . IDA (iron deficiency anemia) 08/11/2015    Past Surgical History:  Procedure Laterality Date  . ORIF SHOULDER FRACTURE Left   . TONSILLECTOMY       OB History   None      Home Medications    Prior to Admission medications   Medication Sig Start Date End Date Taking? Authorizing Provider  amLODipine (NORVASC) 2.5 MG tablet Take 2.5 mg  by mouth daily.   Yes [provider]  ferrous sulfate 325 (65 FE) MG tablet Take 1 tablet (325 mg total) by mouth 2 (two) times daily. With 1/2 glass of orange juice 12/15/15  Yes Davonna Belling, MD  fluconazole (DIFLUCAN) 200 MG tablet Take 1 tablet (200 mg total) by mouth daily for 14 days. 05/30/18 06/13/18 Yes Murray, Alyssa B, PA-C  folic acid (FOLVITE) 1 MG tablet TAKE 2 TABLETS (2 MG TOTAL) BY MOUTH DAILY. 12/11/17  Yes Deveshwar, Abel Presto, MD  HYDROcodone-acetaminophen (NORCO/VICODIN) 5-325 MG tablet Take 1-2 tablets by mouth every 6 (six) hours as needed. 05/30/18  Yes Valere Dross, Alyssa B, PA-C  medroxyPROGESTERone (DEPO-PROVERA) 150 MG/ML injection Inject 150 mg into the muscle every 3 (three) months.   Yes [provider]  nystatin (MYCOSTATIN) 100000 UNIT/ML suspension Take 5 mLs by mouth 4 (four) times daily.   Yes [provider]  pantoprazole (PROTONIX) 20 MG tablet Take 1 tablet (20 mg total) by mouth daily. 05/30/18 06/29/18 Yes Valere Dross, Alyssa B, PA-C  predniSONE (DELTASONE) 5 MG tablet Take 10 mg by mouth daily.    Yes [provider]  Vitamin D, Ergocalciferol, (DRISDOL) 50000 units CAPS capsule Take 1 capsule (50,000 Units total) by mouth 2 (two) times a week. Patient taking differently: Take 50,000 Units by mouth every 7 (seven) days.  12/21/17  Yes Deveshwar, Abel Presto, MD  AMBULATORY NON FORMULARY MEDICATION Medication Name: GI Cocktail 90 ml- viscvous lidocaine 2 % 90 ml Dicyclomine  10 / 5 ml 270 Maalox Total ml's is 450 ml's Take 30 cc's by mouth every 4-6 hours. Patient not taking: Reported on 06/05/2018 06/01/18   Zehr, Janett Billow D, PA-C  lisinopril (PRINIVIL,ZESTRIL) 5 MG tablet Take 1 tablet (5 mg total) by mouth daily. Patient not taking: Reported on 06/05/2018 03/28/18   [name], FNP    Family History Family History  Problem Relation Age of Onset  . Aneurysm Mother   . Stroke Sister   . Seizures Sister   . Lung cancer Maternal Aunt     . Heart attack Paternal Uncle   . Colon cancer Neg Hx   . Esophageal cancer Neg Hx   . Pancreatic cancer Neg Hx   . Stomach cancer Neg Hx     Social History Social History   Tobacco Use  . Smoking status: Never Smoker  . Smokeless tobacco: Never Used  Substance Use Topics  . Alcohol use: No  . Drug use: No     Allergies   Hydroxychloroquine and Latex   Review of Systems Review of Systems  Constitutional:       Per HPI, otherwise negative  HENT:       Per HPI, otherwise negative  Respiratory:       Per HPI, otherwise negative  Cardiovascular:       Per HPI, otherwise negative  Gastrointestinal: Positive for nausea. Negative for vomiting.  Endocrine:       Negative aside from HPI  Genitourinary:       Neg aside from HPI   Musculoskeletal:       Per HPI, otherwise negative  Skin: Negative.   Allergic/Immunologic: Positive for immunocompromised state.  Neurological: Positive for weakness. Negative for syncope.     Physical Exam Updated Vital Signs BP 132/84   Pulse (!) 118   Temp 99.6 F (37.6 C) (Oral)   Resp 18   LMP 05/30/2018   SpO2 99%   Physical Exam  Constitutional: She is oriented to person, place, and time. She has a sickly appearance.  HENT:  Head: Normocephalic and atraumatic.  Eyes: Conjunctivae and EOM are normal.  Cardiovascular: Regular rhythm. Tachycardia present.  Pulmonary/Chest: Effort normal and breath sounds normal. No stridor. No respiratory distress.  Abdominal: She exhibits no distension. There is no tenderness.  Musculoskeletal: She exhibits no edema.  Neurological: She is alert and oriented to person, place, and time. No cranial nerve deficit.  Skin: Skin is warm and dry.  Psychiatric: Her mood appears anxious.  Nursing note and vitals reviewed.    ED Treatments / Results  Labs (all labs ordered are listed, but only abnormal results are displayed) Labs Reviewed  COMPREHENSIVE METABOLIC PANEL - Abnormal; Notable for  the following components:      Result Value   CO2 20 (*)    Glucose, Bld 228 (*)    Calcium 7.9 (*)    Total Protein 6.4 (*)    Albumin 2.4 (*)    AST 72 (*)    All other components within normal limits  CBC WITH DIFFERENTIAL/PLATELET - Abnormal; Notable for the following components:   WBC 2.8 (*)    RBC 3.75 (*)    Hemoglobin 9.1 (*)    HCT 30.2 (*)    MCH 24.3 (*)    RDW 18.0 (*)    Lymphs Abs 0.6 (*)    All other components within normal limits  CBG MONITORING, ED - Abnormal; Notable for the following components:   Glucose-Capillary <10 (*)  All other components within normal limits  CBG MONITORING, ED - Abnormal; Notable for the following components:   Glucose-Capillary 21 (*)    All other components within normal limits  CBG MONITORING, ED - Abnormal; Notable for the following components:   Glucose-Capillary 163 (*)    All other components within normal limits  MAGNESIUM  TROPONIN I  PROTIME-INR  I-STAT BETA HCG BLOOD, ED ([facility], [facility], [facility] ONLY)  CBG MONITORING, ED  CBG MONITORING, ED    EKG EKG Interpretation  Date/Time:  Tuesday June 05 2018 11:04:11 EDT Ventricular Rate:  123 PR Interval:    QRS Duration: 77 QT Interval:  322 QTC Calculation: 461 R Axis:   40 Text Interpretation:  Sinus tachycardia Probable left atrial enlargement Abnormal ekg Confirmed by Carmin Muskrat [phone]) on 06/05/2018 11:20:58 AM Also confirmed by Carmin Muskrat ([id number]), editor Philomena Doheny [phone])  on 06/05/2018 1:54:40 PM   Radiology Dg Chest Portable 1 View  Result Date: 06/05/2018 CLINICAL DATA:  Short of breath, left chest pain, some right arm numbness today, history of lupus EXAM: PORTABLE CHEST 1 VIEW COMPARISON:  CT chest of 05/30/2017 and chest x-ray of the same day FINDINGS: There is parenchymal opacity medially at the left lung base with some elevation of the left hemidiaphragm. This opacity may be due to atelectasis, but developing known pneumonia cannot be excluded.  Follow-up is recommended if clinically warranted. The right lung appears clear. The heart is within upper limits of normal. No bony abnormality is seen. IMPRESSION: Prominent markings medially at the left lung base may be due to atelectasis, but developing pneumonia cannot be excluded. Consider follow-up if warranted clinically. Electronically Signed   By: Ivar Drape M.D.   On: 06/05/2018 11:52    Procedures Procedures (including critical care time)  Medications Ordered in ED Medications  fentaNYL (SUBLIMAZE) injection 50 mcg (50 mcg Intravenous Not Given 06/05/18 1330)  sodium chloride 0.9 % bolus 1,000 mL (0 mLs Intravenous Stopped 06/05/18 1458)    And  0.9 %  sodium chloride infusion ( Intravenous New Bag/Given 06/05/18 1242)  sodium chloride 0.9 % bolus 1,000 mL (1,000 mLs Intravenous New Bag/Given 06/05/18 1458)  gi cocktail (Maalox,Lidocaine,Donnatal) (30 mLs Oral Given 06/05/18 1246)  dextrose 50 % solution 50 mL (50 mLs Intravenous Given 06/05/18 1230)  dextrose 50 % solution 50 mL (50 mLs Intravenous Given 06/05/18 1220)  fentaNYL (SUBLIMAZE) injection 50 mcg (50 mcg Intravenous Given 06/05/18 1457)     Initial Impression / Assessment and Plan / ED Course  I have reviewed the triage vital signs and the nursing notes.  Pertinent labs & imaging results that were available during my care of the patient were reviewed by me and considered in my medical decision making (see chart for details).    After the initial evaluation I reviewed the patient's chart, including prior ED evaluation, and today's GI notes Patient's endoscopy generally reassuring aside from oral ulcers on the hard palate. Endoscopy did not demonstrate notable pathology in the esophagus or stomach. Patient's prior history notable for persistent tachycardia.  After the initial evaluation patient found to have hypoglycemia, receiving D50, 1 amp.  Update:, Patient required additional D50 after glucose remains less than  30.  Update: Patient's glucose to greater than 100.   Update: After fluid resuscitation, GI cocktail, patient continues to have some discomfort.  Sugar has remained appropriate. Patient has persistent tachycardia, pain seems somewhat better, though was previously improved with fentanyl and morphine.   Young female with  history of lupus, on chronic immunosuppressant presents with ongoing chest pain, tachycardia after endoscopy procedure. Patient is awake, alert, afebrile, but given the persistence of her pain, and today's unusual episodes of hypoglycemia, requiring multiple boluses of dextrose 50, she requires admission for further monitoring and management. However, studies generally reassuring, no evidence for ACS, infection (patient does have atelectasis unilaterally, but has no fever, has no tachypnea, no hypoxia.)  PE considered, but less likely given patient's absence of hypoxia, risk factors.   Final Clinical Impressions(s) / ED Diagnoses   Final diagnoses:  Atypical chest pain  Nonspecific chest pain  Tachycardia  hypoglycemia   Carmin Muskrat, MD 06/05/18 1524  CRITICAL CARE Performed by: Carmin Muskrat Total critical care time: 35 minutes Critical care time was exclusive of separately billable procedures and treating other patients. Critical care was necessary to treat or prevent imminent or life-threatening deterioration. Critical care was time spent personally by me on the following activities: development of treatment plan with patient and/or surrogate as well as nursing, discussions with consultants, evaluation of patient's response to treatment, examination of patient, obtaining history from patient or surrogate, ordering and performing treatments and interventions, ordering and review of laboratory studies, ordering and review of radiographic studies, pulse oximetry and re-evaluation of patient's condition.    Carmin Muskrat, MD 06/22/18 [phone]

## 2018-06-05 NOTE — Progress Notes (Signed)
Pt's states no medical or surgical changes since previsit or office visit. 

## 2018-06-05 NOTE — ED Notes (Signed)
CBG 100 

## 2018-06-05 NOTE — ED Triage Notes (Signed)
Pt was transferred from [facility] after getting an upper GI endoscopy. After procedure, pt complained of non-radiating chest pain, rated as an 8 out of 10, which worsens with deep breathing, cough, SOB, dizziness. Pt denies nausea. Pt has a history of dysphagia.

## 2018-06-05 NOTE — Progress Notes (Signed)
1005- I was called into patient's bay at about 955; care partner stating, "I need to talk to someone because my sister is having chest pain."    She states she "has every right to leave and go to the ED."  I informed her that she would have to sign out AMA if they were to leave at this time.  I asked her if she would agree to wait until Dr. Ardis Hughs is done to see her.  She is agreeable to that.  Care partner is angry and I attempted to calm her down and help.  I asked the patient specific questions how she is feeling- she is having chest pain and SOB.  Dr. Ardis Hughs in to see pt and decides to send her to ED.  911 called and EKG done per [employer] RN.  Report called to Almyra Free at [facility]- see transfer sheet for further information.    Prior to discharge, care partner wants the [facility] nurse's name. I spoke with Anastasia Pall and asked her if I was allowed to do this and she said yes.  Name given to care partner per their request.

## 2018-06-05 NOTE — Progress Notes (Signed)
Patient states that she has throat and upper chest pain (up near throat not lower) at this time. Patient states that it's "about a 9" after much coaxing.   Dr Ardis Hughs is aware of the pain. Patient's sister states that she "needs pain medicine."  I explained that we usually don't give pain medicine here.   Dr. Ardis Hughs is aware, and suggested that she take the lidocaine mouth wash as prescribed to her this week.  The patient's sister stated that they couldn't afford any of the medicines, and insisted on samples.  I explained that we don't give samples of that medication.  At that time the sister yelled at me and asked if we were a "health facility, and why can't we give medicine.". The sister was verbally abusive. Dr Ardis Hughs suggested salt water swishes after I informed him again about the throat pain where the patient was pointing.  Patient is stating that she has throat pain and has tried the salt water washes, and that they didn't work either.   When the supervisor came to speak to the patient then stated that she was having severe chest pain and shortness of breath.   The patient did not state that to me.  The patient came in with tachycardia,and is still in sinus tach at this time. Patients sister has been insisting that the patient go to the ER.  She asked if this was "a medical facility that could help her or does she have to go to the emergency room.   The Dr is going to send the he patient to the ER at this time.  We will also have a 12 lead EKG done.  The sister again told the supervisor that she was just going to take the patient to the ER herself, and that we couldn't hold the patient here.    The sister stated that they would just leave, and to take her off the monitor.  Supervisor suggested that they stay and speak to the doctor and that leaving against medical advice was not recommended.  The patient did stay to talk with the doctor.  The doctor decided to send the patient to the ER.  EMS arrived at 1015  am.  Report was given to the EMS including a transfer sheet and a procedure report.   The doctor remained here throughout the transfer period.  The 12 LEAD WAS NORMAL AT THIS TIME. Patient's vitals had been stable throughout the recovery period with the exception of her sinus tachycardia that she came to the facility in admitting.   Patient is a Lupus patient.  The patient's sister is still talking about the staff.  She stated that "Nobody listened to her. "  The doctor was informed three times regarding the patient's progress.   The staff was well aware of the patient's issues and circumstances with her insurance and her medications. Every attempt was made to help the patient feel better.  Dr. Ardis Hughs was aware of symptoms as the patient told me. The sister spoke more than the patient regarding her pain.

## 2018-06-05 NOTE — ED Notes (Signed)
Dextrose 50 given

## 2018-06-06 ENCOUNTER — Telehealth: Payer: Self-pay | Admitting: *Deleted

## 2018-06-06 DIAGNOSIS — J189 Pneumonia, unspecified organism: Secondary | ICD-10-CM

## 2018-06-06 DIAGNOSIS — E162 Hypoglycemia, unspecified: Secondary | ICD-10-CM

## 2018-06-06 DIAGNOSIS — M329 Systemic lupus erythematosus, unspecified: Secondary | ICD-10-CM

## 2018-06-06 DIAGNOSIS — R509 Fever, unspecified: Secondary | ICD-10-CM | POA: Diagnosis present

## 2018-06-06 DIAGNOSIS — R1310 Dysphagia, unspecified: Secondary | ICD-10-CM

## 2018-06-06 LAB — STREP PNEUMONIAE URINARY ANTIGEN: STREP PNEUMO URINARY ANTIGEN: NEGATIVE

## 2018-06-06 LAB — CBC
HCT: 27.9 % — ABNORMAL LOW (ref 36.0–46.0)
HEMOGLOBIN: 8.7 g/dL — AB (ref 12.0–15.0)
MCH: 25.4 pg — ABNORMAL LOW (ref 26.0–34.0)
MCHC: 31.2 g/dL (ref 30.0–36.0)
MCV: 81.3 fL (ref 78.0–100.0)
PLATELETS: 175 10*3/uL (ref 150–400)
RBC: 3.43 MIL/uL — AB (ref 3.87–5.11)
RDW: 18.2 % — ABNORMAL HIGH (ref 11.5–15.5)
WBC: 4.2 10*3/uL (ref 4.0–10.5)

## 2018-06-06 LAB — BASIC METABOLIC PANEL
ANION GAP: 8 (ref 5–15)
BUN: 7 mg/dL (ref 6–20)
CALCIUM: 7.6 mg/dL — AB (ref 8.9–10.3)
CO2: 19 mmol/L — AB (ref 22–32)
CREATININE: 0.63 mg/dL (ref 0.44–1.00)
Chloride: 110 mmol/L (ref 98–111)
Glucose, Bld: 78 mg/dL (ref 70–99)
Potassium: 4.1 mmol/L (ref 3.5–5.1)
SODIUM: 137 mmol/L (ref 135–145)

## 2018-06-06 LAB — LACTIC ACID, PLASMA: Lactic Acid, Venous: 0.9 mmol/L (ref 0.5–1.9)

## 2018-06-06 LAB — GLUCOSE, CAPILLARY: GLUCOSE-CAPILLARY: 88 mg/dL (ref 70–99)

## 2018-06-06 LAB — PATHOLOGIST SMEAR REVIEW

## 2018-06-06 MED ORDER — MORPHINE SULFATE (PF) 2 MG/ML IV SOLN
1.0000 mg | INTRAVENOUS | Status: DC | PRN
  Administered 2018-06-06 – 2018-06-07 (×2): 1 mg via INTRAVENOUS
  Filled 2018-06-06 (×2): qty 1

## 2018-06-06 MED ORDER — FOLIC ACID 1 MG PO TABS
2.0000 mg | ORAL_TABLET | Freq: Every day | ORAL | Status: DC
  Administered 2018-06-06 – 2018-06-09 (×4): 2 mg via ORAL
  Filled 2018-06-06 (×4): qty 2

## 2018-06-06 MED ORDER — AZITHROMYCIN 500 MG PO TABS
500.0000 mg | ORAL_TABLET | Freq: Every day | ORAL | Status: DC
  Administered 2018-06-06 – 2018-06-08 (×3): 500 mg via ORAL
  Filled 2018-06-06 (×4): qty 1

## 2018-06-06 NOTE — Progress Notes (Addendum)
PROGRESS NOTE    [name] [name]  XAJ:[id number] DOB: [birth year] DOA: 06/05/2018 PCP: [name], FNP   Brief Narrative:   22 year old woman with a history of SLE, hypertension, gout, iron deficiency anemia, prior to the emergency department after being seen at the lower GI after her EGD, complaining of chest pain, hypoglycemia, shortness of breath, and fever up to 102.  She also had yellow sputum for about 2 weeks.  CT angio was negative for PE.  However, she was found to have suspected pneumonia.  Sepsis protocol was initiated, and placed on Rocephin and azithromycin, IV fluids.  Blood cultures and urine cultures are pending.  In addition, after being found to have candidiasis, she is on p.o. Diflucan, and is to continue.   Assessment & Plan:   Principal Problem:   Tachycardia Active Problems:   SLE (systemic lupus erythematosus) (HCC)   Mucositis oral   Dysphagia   Pleuritic chest pain   Hypoglycemia without diagnosis of diabetes mellitus   pneumonia  -Fever of 102.3 on presentation -Rocephin and azithromycin - Blood cultures pending. -Continue IVF currently at 125 cc/h  -Continue Tylenol for fever -check for strep -HIV pending   Tachycardia CT Angio of the chest was negative for PE  -likely due to sepsis due to above   Dysphagia / Odynophagia related to mucositis/thrush.  - EGD on 06/05/2018 with ulcers seen Continue Diflucan Continue Viscous Lidocaine every 4 hours. Continue Norco q 6 hrs prn HIV -liquid diet-- advance as tolerated -if fever continues, may consider adding acyclovir  SLE, on prednisone Continue prednisone  Hypoglycemia -glucose <10 upon arrival in ER-- ? Accuracy -improved  Anemia -trend CBC   DVT prophylaxis:  Lovenox Code Status: Full Code   Family Communication: Sister  Disposition Plan:  Home when clinically improved   Consultants:    Procedures: None at the hospital, the patient did have:   EGD  For candida esophagitis by Dr.  Ardis Hughs on 9/24 showing 3 small ulcers on hard palate    Subjective: Still with mouth pain and no appetite/hurting all over  Objective: Vitals:   06/06/18 0223 06/06/18 0326 06/06/18 0425 06/06/18 0816  BP: 126/69  (!) 125/91 (!) 124/91  Pulse: (!) 115  (!) 111 (!) 114  Resp: 20  18 18   Temp: (!) 100.8 F (38.2 C) 99.9 F (37.7 C) 99.9 F (37.7 C) 99.5 F (37.5 C)  TempSrc:  Oral Oral   SpO2: 98%  96% 99%  Weight:      Height:        Intake/Output Summary (Last 24 hours) at 06/06/2018 1024 Last data filed at 06/06/2018 0500 Gross per 24 hour  Intake 1857 ml  Output -  Net 1857 ml   Filed Weights   06/05/18 1747  Weight: 69.4 kg    Examination:  General exam: Ill appearing, weak - voice muffled Respiratory system: BIlateral scattered rhonchi, no wheezing or rales.  No accessory muscle use.   Cardiovascular system: S1 & S2 heard, tachycardia noted.  No JVD, murmurs, rubs, gallops or clicks. No pedal edema. Gastrointestinal system: Abdomen is nondistended, soft and nontender. No organomegaly or masses felt. Normal bowel sounds heard. Central nervous system: Alert and oriented. No focal neurological deficits.     Data Reviewed: I have personally reviewed following labs and imaging studies  CBC: Recent Labs  Lab 05/30/18 1429 06/05/18 1225 06/06/18 0008  WBC 3.7* 2.8* 4.2  NEUTROABS  --  2.1  --   HGB 11.0* 9.1*  8.7*  HCT 35.0* 30.2* 27.9*  MCV 76.1* 80.5 81.3  PLT 375 195 235   Basic Metabolic Panel: Recent Labs  Lab 05/30/18 1429 06/05/18 1225 06/06/18 0008  NA 138 135 137  K 4.0 3.8 4.1  CL 107 105 110  CO2 22 20* 19*  GLUCOSE 95 228* 78  BUN 12 12 7   CREATININE 0.52 0.84 0.63  CALCIUM 9.0 7.9* 7.6*  MG  --  1.7  --    GFR: Estimated Creatinine Clearance: 101.3 mL/min (by C-G formula based on SCr of 0.63 mg/dL). Liver Function Tests: Recent Labs  Lab 06/05/18 1225  AST 72*  ALT 32  ALKPHOS 49  BILITOT 0.7  PROT 6.4*  ALBUMIN 2.4*    No results for input(s): LIPASE, AMYLASE in the last 168 hours. No results for input(s): AMMONIA in the last 168 hours. Coagulation Profile: Recent Labs  Lab 06/05/18 1225  INR 0.94   Cardiac Enzymes: Recent Labs  Lab 06/05/18 1225 06/05/18 1838  TROPONINI <0.03 <0.03   BNP (last 3 results) No results for input(s): PROBNP in the last 8760 hours. HbA1C: No results for input(s): HGBA1C in the last 72 hours. CBG: Recent Labs  Lab 06/05/18 1203 06/05/18 1225 06/05/18 1245 06/05/18 1347 06/05/18 1405  GLUCAP <10* 21* 163* 88 82   Lipid Profile: No results for input(s): CHOL, HDL, LDLCALC, TRIG, CHOLHDL, LDLDIRECT in the last 72 hours. Thyroid Function Tests: No results for input(s): TSH, T4TOTAL, FREET4, T3FREE, THYROIDAB in the last 72 hours. Anemia Panel: No results for input(s): VITAMINB12, FOLATE, FERRITIN, TIBC, IRON, RETICCTPCT in the last 72 hours. Sepsis Labs: Recent Labs  Lab 06/05/18 1838 06/05/18 2025 06/06/18 0008  PROCALCITON 0.17  --   --   LATICACIDVEN  --  1.2 0.9    Recent Results (from the past 240 hour(s))  Group A Strep by PCR     Status: None   Collection Time: 05/30/18  2:52 PM  Result Value Ref Range Status   Group A Strep by PCR NOT DETECTED NOT DETECTED Final    Comment: Performed at [facility], [place] [address] [place], [place] [address]         Radiology Studies: Ct Angio Chest Pe W Or Wo Contrast  Result Date: 06/05/2018 CLINICAL DATA:  Chest pain and shortness of breath. EXAM: CT ANGIOGRAPHY CHEST WITH CONTRAST TECHNIQUE: Multidetector CT imaging of the chest was performed using the standard protocol during bolus administration of intravenous contrast. Multiplanar CT image reconstructions and MIPs were obtained to evaluate the vascular anatomy. CONTRAST:  136mL ISOVUE-370 IOPAMIDOL (ISOVUE-370) INJECTION 76% COMPARISON:  CTA chest abdomen pelvis 05/30/2018 FINDINGS: Cardiovascular: --Pulmonary arteries:  Contrast injection is sufficient to demonstrate satisfactory opacification of the pulmonary arteries to the segmental level. There is no pulmonary embolus. The main pulmonary artery is within normal limits for size. --Aorta: Satisfactory opacification of the thoracic aorta. No aortic dissection or other acute aortic syndrome. Normal variant aortic arch branching pattern with the left vertebral artery arising independently from the aortic arch. The aortic course and caliber are normal. There is no aortic atherosclerosis. --Heart: Mild cardiomegaly.  No pericardial effusion. Mediastinum/Nodes: No mediastinal, hilar or axillary lymphadenopathy. The visualized thyroid and thoracic esophageal course are unremarkable. Lungs/Pleura: Left basilar opacities are new compared to 05/30/2018. No pleural effusion or pneumothorax. Upper Abdomen: Contrast bolus timing is not optimized for evaluation of the abdominal organs. Within this limitation, the visualized organs of the upper abdomen are normal. Musculoskeletal: No chest  wall abnormality. No acute or significant osseous findings. Review of the MIP images confirms the above findings. IMPRESSION: 1. No pulmonary embolus or acute aortic syndrome. 2. New opacities in the left lower lobe compared to 05/30/2018, which may indicate atelectasis or pneumonia. Electronically Signed   By: Ulyses Jarred M.D.   On: 06/05/2018 17:15   Dg Chest Portable 1 View  Result Date: 06/05/2018 CLINICAL DATA:  Short of breath, left chest pain, some right arm numbness today, history of lupus EXAM: PORTABLE CHEST 1 VIEW COMPARISON:  CT chest of 05/30/2017 and chest x-ray of the same day FINDINGS: There is parenchymal opacity medially at the left lung base with some elevation of the left hemidiaphragm. This opacity may be due to atelectasis, but developing known pneumonia cannot be excluded. Follow-up is recommended if clinically warranted. The right lung appears clear. The heart is within upper  limits of normal. No bony abnormality is seen. IMPRESSION: Prominent markings medially at the left lung base may be due to atelectasis, but developing pneumonia cannot be excluded. Consider follow-up if warranted clinically. Electronically Signed   By: Ivar Drape M.D.   On: 06/05/2018 11:52        Scheduled Meds: . amLODipine  2.5 mg Oral Daily  . enoxaparin (LOVENOX) injection  40 mg Subcutaneous Q24H  . fluconazole  200 mg Oral Daily  . pantoprazole  20 mg Oral Daily  . predniSONE  10 mg Oral Daily  . sodium chloride flush  3 mL Intravenous Q12H   Continuous Infusions: . sodium chloride 125 mL/hr at 06/06/18 0224  . azithromycin 500 mg (06/05/18 1950)  . cefTRIAXone (ROCEPHIN)  IV Stopped (06/05/18 1926)     LOS: 0 days        Eulogio Bear DO [facility]   If 7PM-7AM, please contact night-coverage [email or web address] Password [facility] 06/06/2018, 10:24 AM

## 2018-06-06 NOTE — Progress Notes (Signed)
Pt sitting at side of bed after going to bathroom. Pt informed we need urine specimen. Fluids again infusing at 125/h. Famioy remains at bedside.

## 2018-06-06 NOTE — Progress Notes (Signed)
Pt sleeping with respirations even and non labored. Iv infusing at 125/h. Family at bedside

## 2018-06-06 NOTE — Telephone Encounter (Signed)
Patient was admitted to [facility] yesterday after leaving [place] for chest pain.  Follow up call not made as pt still in hospital this morning.

## 2018-06-06 NOTE — Evaluation (Signed)
Physical Therapy Evaluation Patient Details Name: [name] [name] MRN: [id number] DOB: 04/23/1996 Today's Date: 06/06/2018   History of Present Illness  Pt is a 22 y.o. F with significant PMH of SLE, hypertension, gout, iron deficiency anemia, presenting to ED after getting lower GI with chest pain, hypoglycemia, shortness of breath, and fever.  Clinical Impression  Pt admitted with above diagnosis. Pt currently with functional limitations due to the deficits listed below (see PT Problem List). At baseline, patient is independent, working at [employer] and going to school for social work. Currently, presenting with decreased functional mobility secondary to decreased gait speed for her age, mild balance deficits, and back pain. Ambulating 200 feet with supervision. HR 120-130 bpm.Will plan to follow acutely and can attempt stair training next session.  Pt will benefit from skilled PT to increase their independence and safety with mobility to allow discharge home.     Follow Up Recommendations No PT follow up    Equipment Recommendations  None recommended by PT    Recommendations for Other Services   Speech consult? (patient reports throat pain with difficulty swallowing)    Precautions / Restrictions Precautions Precautions: Other (comment) Precaution Comments: tachycardic Restrictions Weight Bearing Restrictions: No      Mobility  Bed Mobility Overal bed mobility: Modified Independent             General bed mobility comments: increased time  Transfers Overall transfer level: Needs assistance Equipment used: None Transfers: Sit to/from Stand Sit to Stand: Supervision            Ambulation/Gait Ambulation/Gait assistance: Supervision Gait Distance (Feet): 200 Feet Assistive device: None Gait Pattern/deviations: WFL(Within Functional Limits) Gait velocity: decreased   General Gait Details: slow speed, looking down frequently  Stairs            Wheelchair  Mobility    Modified Rankin (Stroke Patients Only)       Balance Overall balance assessment: Mild deficits observed, not formally tested                                           Pertinent Vitals/Pain Pain Assessment: Faces Faces Pain Scale: Hurts a little bit Pain Location: upper back, throat Pain Descriptors / Indicators: Aching Pain Intervention(s): Monitored during session    Home Living Family/patient expects to be discharged to:: Private residence Living Arrangements: Other relatives(sister) Available Help at Discharge: Family Type of Home: Apartment Home Access: Stairs to enter Entrance Stairs-Rails: Chemical engineer of Steps: (flight) Home Layout: One level Home Equipment: None      Prior Function Level of Independence: Independent         Comments: works at [employer], 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, newspaper        Extremity/Trunk Assessment   Upper Extremity Assessment Upper Extremity Assessment: Overall WFL for tasks assessed;LUE deficits/detail LUE Deficits / Details: increased LUE edema, likely from IV site    Lower Extremity Assessment Lower Extremity Assessment: Overall WFL for tasks assessed    Cervical / Trunk Assessment Cervical / Trunk Assessment: Normal  Communication   Communication: Other (comment)(soft spoken)  Cognition Arousal/Alertness: Awake/alert Behavior During Therapy: WFL for tasks assessed/performed Overall Cognitive Status: Within Functional Limits for tasks assessed  General Comments: Very reserved      General Comments      Exercises     Assessment/Plan    PT Assessment Patient needs continued PT services  PT Problem List Decreased activity tolerance;Decreased balance;Decreased mobility;Pain       PT Treatment Interventions Gait training;Stair training;Functional mobility training;Therapeutic  activities;Therapeutic exercise;Balance training;Patient/family education    PT Goals (Current goals can be found in the Care Plan section)  Acute Rehab PT Goals Patient Stated Goal: get back to school PT Goal Formulation: With patient Time For Goal Achievement: 06/20/18 Potential to Achieve Goals: Good    Frequency Min 3X/week   Barriers to discharge        Co-evaluation               AM-PAC PT "6 Clicks" Daily Activity  Outcome Measure Difficulty turning over in bed (including adjusting bedclothes, sheets and blankets)?: None Difficulty moving from lying on back to sitting on the side of the bed? : None Difficulty sitting down on and standing up from a chair with arms (e.g., wheelchair, bedside commode, etc,.)?: None Help needed moving to and from a bed to chair (including a wheelchair)?: None Help needed walking in hospital room?: A Little Help needed climbing 3-5 steps with a railing? : A Little 6 Click Score: 22    End of Session Equipment Utilized During Treatment: Gait belt Activity Tolerance: Patient tolerated treatment well Patient left: in bed;with call bell/phone within reach Nurse Communication: Mobility status PT Visit Diagnosis: Unsteadiness on feet (R26.81);Pain Pain - part of body: (back, throat)    Time: 1201-1219 PT Time Calculation (min) (ACUTE ONLY): 18 min   Charges:   PT Evaluation $PT Eval Low Complexity: Roy, PT, DPT Acute Rehabilitation Services Pager [phone] Office [phone]   Willy Eddy 06/06/2018, 1:54 PM

## 2018-06-06 NOTE — Progress Notes (Signed)
Pt resting quietly in bed. REspiration unlabored. Family at bedside.

## 2018-06-06 NOTE — Progress Notes (Signed)
Pt states pain continues and stops her from taking deep breath. Pain medication given. Sister remains at bedside

## 2018-06-06 NOTE — Progress Notes (Signed)
Pt ate all of liquid diet and tolerates well.

## 2018-06-06 NOTE — Progress Notes (Signed)
Atetmpted report

## 2018-06-07 DIAGNOSIS — J181 Lobar pneumonia, unspecified organism: Principal | ICD-10-CM

## 2018-06-07 DIAGNOSIS — R0781 Pleurodynia: Secondary | ICD-10-CM

## 2018-06-07 DIAGNOSIS — K1230 Oral mucositis (ulcerative), unspecified: Secondary | ICD-10-CM

## 2018-06-07 HISTORY — DX: Lobar pneumonia, unspecified organism: J18.1

## 2018-06-07 LAB — RAPID URINE DRUG SCREEN, HOSP PERFORMED
Amphetamines: NOT DETECTED
BENZODIAZEPINES: NOT DETECTED
Barbiturates: POSITIVE — AB
Cocaine: NOT DETECTED
OPIATES: POSITIVE — AB
Tetrahydrocannabinol: NOT DETECTED

## 2018-06-07 LAB — URINALYSIS, ROUTINE W REFLEX MICROSCOPIC
BILIRUBIN URINE: NEGATIVE
GLUCOSE, UA: NEGATIVE mg/dL
KETONES UR: 5 mg/dL — AB
Nitrite: NEGATIVE
PROTEIN: 100 mg/dL — AB
Specific Gravity, Urine: 1.019 (ref 1.005–1.030)
pH: 6 (ref 5.0–8.0)

## 2018-06-07 LAB — BASIC METABOLIC PANEL
Anion gap: 6 (ref 5–15)
CO2: 18 mmol/L — AB (ref 22–32)
Calcium: 7.8 mg/dL — ABNORMAL LOW (ref 8.9–10.3)
Chloride: 113 mmol/L — ABNORMAL HIGH (ref 98–111)
Creatinine, Ser: 0.52 mg/dL (ref 0.44–1.00)
GFR calc Af Amer: >60 mL/min (ref >60)
GFR calc non Af Amer: >60 mL/min (ref >60)
Glucose, Bld: 83 mg/dL (ref 70–99)
Potassium: 3.8 mmol/L (ref 3.5–5.1)
Sodium: 137 mmol/L (ref 135–145)

## 2018-06-07 LAB — CBC
HEMATOCRIT: 27.4 % — AB (ref 36.0–46.0)
Hemoglobin: 8.7 g/dL — ABNORMAL LOW (ref 12.0–15.0)
MCH: 24.7 pg — ABNORMAL LOW (ref 26.0–34.0)
MCHC: 31.8 g/dL (ref 30.0–36.0)
MCV: 77.8 fL — AB (ref 78.0–100.0)
PLATELETS: 188 10*3/uL (ref 150–400)
RBC: 3.52 MIL/uL — ABNORMAL LOW (ref 3.87–5.11)
RDW: 18.4 % — AB (ref 11.5–15.5)
WBC: 3.8 10*3/uL — ABNORMAL LOW (ref 4.0–10.5)

## 2018-06-07 LAB — HIV ANTIBODY (ROUTINE TESTING W REFLEX): HIV Screen 4th Generation wRfx: NONREACTIVE

## 2018-06-07 NOTE — Evaluation (Signed)
Occupational Therapy Evaluation Patient Details Name: [name] [name] MRN: [id number] DOB: 1996-04-30 Today's Date: 06/07/2018    History of Present Illness Pt is a 22 y.o. F with significant PMH of SLE, hypertension, gout, iron deficiency anemia, presenting to ED after getting lower GI with chest pain, hypoglycemia, shortness of breath, and fever.   Clinical Impression   Pt is typically independent and works at [employer]. Presents with L UE edema and decreased activity tolerance limiting independence in ADL. Pt with HR to 120 with ADL training. Pt requires min to mod assist for ADL and ambulated pushing IV pole with min guard assist. Pt likely to progress for return home with her sisters.    Follow Up Recommendations  No OT follow up    Equipment Recommendations  3 in 1 bedside commode    Recommendations for Other Services       Precautions / Restrictions Precautions Precaution Comments: watch HR Restrictions Weight Bearing Restrictions: No      Mobility Bed Mobility               General bed mobility comments: pt seated EOB  Transfers Overall transfer level: Needs assistance Equipment used: None Transfers: Sit to/from Stand Sit to Stand: Supervision              Balance                                           ADL either performed or assessed with clinical judgement   ADL Overall ADL's : Needs assistance/impaired Eating/Feeding: Sitting;Minimal assistance Eating/Feeding Details (indicate cue type and reason): assist to open containers Grooming: Wash/dry hands;Wash/dry face;Oral care;Sitting;Set up   Upper Body Bathing: Minimal assistance;Sitting   Lower Body Bathing: Moderate assistance;Sit to/from stand   Upper Body Dressing : Minimal assistance;Sitting   Lower Body Dressing: Moderate assistance;Sit to/from stand   Toilet Transfer: Min guard;BSC   Toileting- Water quality scientist and Hygiene: Moderate assistance;Sit to/from  stand       Functional mobility during ADLs: Min guard(pushed IV pole) General ADL Comments: pt limited by L UE edema      Vision Patient Visual Report: No change from baseline       Perception     Praxis      Pertinent Vitals/Pain Pain Assessment: Faces Faces Pain Scale: Hurts a little bit Pain Location: buttocks, L UE Pain Descriptors / Indicators: Sore Pain Intervention(s): Monitored during session;Premedicated before session     Hand Dominance Right   Extremity/Trunk Assessment Upper Extremity Assessment Upper Extremity Assessment: LUE deficits/detail LUE Deficits / Details: edematous LUE Coordination: decreased fine motor   Lower Extremity Assessment Lower Extremity Assessment: Defer to PT evaluation   Cervical / Trunk Assessment Cervical / Trunk Assessment: Normal   Communication Communication Communication: Expressive difficulties(low volume)   Cognition Arousal/Alertness: Awake/alert Behavior During Therapy: WFL for tasks assessed/performed Overall Cognitive Status: Within Functional Limits for tasks assessed                                     General Comments  educated in edema management of L UE and elevated on 3 pillows    Exercises     Shoulder Instructions      Home Living Family/patient expects to be discharged to:: Private residence Living Arrangements: Other relatives(sisters) Available  Help at Discharge: Family;Available PRN/intermittently Type of Home: Apartment Home Access: Stairs to enter   Entrance Stairs-Rails: Left;Right Home Layout: One level     Bathroom Shower/Tub: Teacher, early years/pre: Standard     Home Equipment: None          Prior Functioning/Environment Level of Independence: Independent        Comments: works at [employer], going to school for social work        OT Problem List: Decreased strength;Decreased activity tolerance;Decreased knowledge of use of DME or  AE;Decreased coordination;Impaired UE functional use;Increased edema;Pain      OT Treatment/Interventions: Self-care/ADL training;DME and/or AE instruction;Patient/family education    OT Goals(Current goals can be found in the care plan section) Acute Rehab OT Goals Patient Stated Goal: get back to school OT Goal Formulation: With patient Time For Goal Achievement: 06/21/18 Potential to Achieve Goals: Good ADL Goals Pt Will Transfer to Toilet: Independently;ambulating;bedside commode(over toilet) Pt Will Perform Toileting - Clothing Manipulation and hygiene: sit to/from stand;Independently Pt Will Perform Tub/Shower Transfer: Tub transfer;with supervision;ambulating;3 in 1 Additional ADL Goal #1: Pt will be knowledgeable in edema management of L UE. Additional ADL Goal #2: Pt will perform bathing and dressing independently.  OT Frequency: Min 2X/week   Barriers to D/C:            Co-evaluation              AM-PAC PT "6 Clicks" Daily Activity     Outcome Measure Help from another person eating meals?: A Little Help from another person taking care of personal grooming?: A Little Help from another person toileting, which includes using toliet, bedpan, or urinal?: A Lot Help from another person bathing (including washing, rinsing, drying)?: A Lot Help from another person to put on and taking off regular upper body clothing?: A Little Help from another person to put on and taking off regular lower body clothing?: A Lot 6 Click Score: 15   End of Session Equipment Utilized During Treatment: Gait belt Nurse Communication: Other (comment)(ok to have peaches and juice)  Activity Tolerance: Patient tolerated treatment well Patient left: in chair;with call bell/phone within reach;with family/visitor present  OT Visit Diagnosis: Other abnormalities of gait and mobility (R26.89);Muscle weakness (generalized) (M62.81);Pain Pain - Right/Left: Right Pain - part of body: Arm                 Time: 1015-1105 OT Time Calculation (min): 50 min Charges:  OT General Charges $OT Visit: 1 Visit OT Evaluation $OT Eval Moderate Complexity: 1 Mod OT Treatments $Therapeutic Activity: 23-37 mins  Nestor Lewandowsky, OTR/L Acute Rehabilitation Services Pager: [phone] Office: [phone]  [name] [name] 06/07/2018, 1:22 PM

## 2018-06-07 NOTE — Progress Notes (Signed)
Urine drug sample sent down. Received morphine and vicodin earlier which will affect results.

## 2018-06-07 NOTE — Progress Notes (Addendum)
[facility] PROGRESS NOTE    Progress Note  [name] [name]  XKG:[id number] DOB: Apr 17, 1996 DOA: 06/05/2018 PCP: [name], FNP     Brief Narrative:   [name] [name] is an 22 y.o. female with a history of SLE, hypertension, gout, iron deficiency anemia, prior to the emergency department after being seen at the lower GI after her EGD, complaining of chest pain, hypoglycemia, shortness of breath, and fever up to 102.  She also had yellow sputum for about 2 weeks.  CT angio was negative for PE.  However, she was found to have suspected pneumonia  Assessment/Plan:   Lobar pneumonia ([place]): Has remained afebrile. Cont IV rocephin and azithromycin. Culture data has remained negative till date. Tylenol for fevers. HIV and strep PNeumo negative. Still  With significant dysphagia  Dysphagia probably due to multiple ulcers see on EGD: EGD on 9.24 showed 3 ulcer on the hard palate of oropharynx. Cont diflucan and viscous lidocaine, norco for pain. Still with pain with swallowing. Cont liq diet.  SLE: Cont steroids.    IDA (iron deficiency anemia) hbg stable cont to monitor.  Mucositis oral Cont steroids.  Hypoglycemia without diagnosis of diabetes mellitus  None today cont to monitor have encourage eating   DVT prophylaxis: lovexno Family Communication:mother Disposition Plan/Barrier to D/C: home in am Code Status:     Code Status Orders  (From admission, onward)         Start     Ordered   06/05/18 1527  Full code  Continuous     06/05/18 1527        Code Status History    Date Active Date Inactive Code Status Order ID Comments User Context   01/08/2018 2308 01/13/2018 2009 Full Code [id number]  Etta Quill, DO ED        IV Access:    Peripheral IV   Procedures and diagnostic studies:   Ct Angio Chest Pe W Or Wo Contrast  Result Date: 06/05/2018 CLINICAL DATA:  Chest pain and shortness of breath. EXAM: CT ANGIOGRAPHY CHEST WITH CONTRAST  TECHNIQUE: Multidetector CT imaging of the chest was performed using the standard protocol during bolus administration of intravenous contrast. Multiplanar CT image reconstructions and MIPs were obtained to evaluate the vascular anatomy. CONTRAST:  132mL ISOVUE-370 IOPAMIDOL (ISOVUE-370) INJECTION 76% COMPARISON:  CTA chest abdomen pelvis 05/30/2018 FINDINGS: Cardiovascular: --Pulmonary arteries: Contrast injection is sufficient to demonstrate satisfactory opacification of the pulmonary arteries to the segmental level. There is no pulmonary embolus. The main pulmonary artery is within normal limits for size. --Aorta: Satisfactory opacification of the thoracic aorta. No aortic dissection or other acute aortic syndrome. Normal variant aortic arch branching pattern with the left vertebral artery arising independently from the aortic arch. The aortic course and caliber are normal. There is no aortic atherosclerosis. --Heart: Mild cardiomegaly.  No pericardial effusion. Mediastinum/Nodes: No mediastinal, hilar or axillary lymphadenopathy. The visualized thyroid and thoracic esophageal course are unremarkable. Lungs/Pleura: Left basilar opacities are new compared to 05/30/2018. No pleural effusion or pneumothorax. Upper Abdomen: Contrast bolus timing is not optimized for evaluation of the abdominal organs. Within this limitation, the visualized organs of the upper abdomen are normal. Musculoskeletal: No chest wall abnormality. No acute or significant osseous findings. Review of the MIP images confirms the above findings. IMPRESSION: 1. No pulmonary embolus or acute aortic syndrome. 2. New opacities in the left lower lobe compared to 05/30/2018, which may indicate atelectasis or pneumonia. Electronically Signed   By: Lennette Bihari  [place] M.D.   On: 06/05/2018 17:15   Dg Chest Portable 1 View  Result Date: 06/05/2018 CLINICAL DATA:  Short of breath, left chest pain, some right arm numbness today, history of lupus EXAM:  PORTABLE CHEST 1 VIEW COMPARISON:  CT chest of 05/30/2017 and chest x-ray of the same day FINDINGS: There is parenchymal opacity medially at the left lung base with some elevation of the left hemidiaphragm. This opacity may be due to atelectasis, but developing known pneumonia cannot be excluded. Follow-up is recommended if clinically warranted. The right lung appears clear. The heart is within upper limits of normal. No bony abnormality is seen. IMPRESSION: Prominent markings medially at the left lung base may be due to atelectasis, but developing pneumonia cannot be excluded. Consider follow-up if warranted clinically. Electronically Signed   By: Ivar Drape M.D.   On: 06/05/2018 11:52     Medical Consultants:    None.  Anti-Infectives:   Rocpehin and azithro  Subjective:    [name] [name] some pai still with swallowing  Objective:    Vitals:   06/07/18 0046 06/07/18 0309 06/07/18 0424 06/07/18 0553  BP: 122/84 (!) 137/105 (!) 146/108 (!) 144/105  Pulse: (!) 101 95  (!) 107  Resp: 16 (!) 26  (!) 21  Temp: 98.9 F (37.2 C) 98.5 F (36.9 C)  98.6 F (37 C)  TempSrc: Oral Oral  Oral  SpO2: 98% 98%  96%  Weight:      Height:        Intake/Output Summary (Last 24 hours) at 06/07/2018 0759 Last data filed at 06/07/2018 0400 Gross per 24 hour  Intake 3332.08 ml  Output -  Net 3332.08 ml   Filed Weights   06/05/18 1747 06/06/18 2021  Weight: 69.4 kg 70.6 kg    Exam: General exam: In no acute distress. Respiratory system: Good air movement and clear to auscultation. Cardiovascular system: S1 & S2 heard, RRR. No JVD. Gastrointestinal system: Abdomen is nondistended, soft and nontender, mouth and tongue  ulcer Central nervous system: Alert and oriented. No focal neurological deficits. Extremities: No pedal edema. Skin: No rashes, lesions or ulcers Psychiatry: Judgement and insight appear normal. Mood & affect appropriate.    Data Reviewed:    Labs: Basic Metabolic  Panel: Recent Labs  Lab 06/05/18 1225 06/06/18 0008 06/07/18 0333  NA 135 137 137  K 3.8 4.1 3.8  CL 105 110 113*  CO2 20* 19* 18*  GLUCOSE 228* 78 83  BUN 12 7 <5*  CREATININE 0.84 0.63 0.52  CALCIUM 7.9* 7.6* 7.8*  MG 1.7  --   --    GFR Estimated Creatinine Clearance: 102.2 mL/min (by C-G formula based on SCr of 0.52 mg/dL). Liver Function Tests: Recent Labs  Lab 06/05/18 1225  AST 72*  ALT 32  ALKPHOS 49  BILITOT 0.7  PROT 6.4*  ALBUMIN 2.4*   No results for input(s): LIPASE, AMYLASE in the last 168 hours. No results for input(s): AMMONIA in the last 168 hours. Coagulation profile Recent Labs  Lab 06/05/18 1225  INR 0.94    CBC: Recent Labs  Lab 06/05/18 1225 06/06/18 0008 06/07/18 0333  WBC 2.8* 4.2 3.8*  NEUTROABS 2.1  --   --   HGB 9.1* 8.7* 8.7*  HCT 30.2* 27.9* 27.4*  MCV 80.5 81.3 77.8*  PLT 195 175 188   Cardiac Enzymes: Recent Labs  Lab 06/05/18 1225 06/05/18 1838  TROPONINI <0.03 <0.03   BNP (last 3 results) No results  for input(s): PROBNP in the last 8760 hours. CBG: Recent Labs  Lab 06/05/18 1225 06/05/18 1245 06/05/18 1347 06/05/18 1405 06/06/18 1957  GLUCAP 21* 163* 88 82 88   D-Dimer: No results for input(s): DDIMER in the last 72 hours. Hgb A1c: No results for input(s): HGBA1C in the last 72 hours. Lipid Profile: No results for input(s): CHOL, HDL, LDLCALC, TRIG, CHOLHDL, LDLDIRECT in the last 72 hours. Thyroid function studies: No results for input(s): TSH, T4TOTAL, T3FREE, THYROIDAB in the last 72 hours.  Invalid input(s): FREET3 Anemia work up: No results for input(s): VITAMINB12, FOLATE, FERRITIN, TIBC, IRON, RETICCTPCT in the last 72 hours. Sepsis Labs: Recent Labs  Lab 06/05/18 1225 06/05/18 1838 06/05/18 2025 06/06/18 0008 06/07/18 0333  PROCALCITON  --  0.17  --   --   --   WBC 2.8*  --   --  4.2 3.8*  LATICACIDVEN  --   --  1.2 0.9  --    Microbiology Recent Results (from the past 240 hour(s))    Group A Strep by PCR     Status: None   Collection Time: 05/30/18  2:52 PM  Result Value Ref Range Status   Group A Strep by PCR NOT DETECTED NOT DETECTED Final    Comment: Performed at [facility], [place] [address], [place], [place] [address]  Culture, blood (x 2)     Status: None (Preliminary result)   Collection Time: 06/05/18  8:15 PM  Result Value Ref Range Status   Specimen Description BLOOD LEFT ANTECUBITAL  Final   Special Requests   Final    BOTTLES DRAWN AEROBIC ONLY Blood Culture adequate volume   Culture   Final    NO GROWTH 2 DAYS Performed at [facility], [place] [address] [place], [place] [address]    Report Status PENDING  Incomplete  Culture, blood (x 2)     Status: None (Preliminary result)   Collection Time: 06/05/18  8:23 PM  Result Value Ref Range Status   Specimen Description BLOOD LEFT HAND  Final   Special Requests   Final    BOTTLES DRAWN AEROBIC AND ANAEROBIC Blood Culture adequate volume   Culture   Final    NO GROWTH 2 DAYS Performed at [facility], [place] [address], [place], [place] [address]    Report Status PENDING  Incomplete     Medications:   . amLODipine  2.5 mg Oral Daily  . azithromycin  500 mg Oral Daily  . enoxaparin (LOVENOX) injection  40 mg Subcutaneous Q24H  . fluconazole  200 mg Oral Daily  . folic acid  2 mg Oral Daily  . pantoprazole  20 mg Oral Daily  . predniSONE  10 mg Oral Daily  . sodium chloride flush  3 mL Intravenous Q12H   Continuous Infusions: . sodium chloride 125 mL/hr at 06/07/18 0400  . cefTRIAXone (ROCEPHIN)  IV Stopped (06/06/18 2230)      LOS: 1 day   Charlynne Cousins  [facility] Pager [phone]  *Please refer to [email or web address], password [email or web address] to get updated schedule on who will round on this patient, as hospitalists switch teams weekly. If 7PM-7AM, please contact night-coverage at [email or web address], password [email or web address] for any overnight needs.  06/07/2018, 7:59 AM

## 2018-06-07 NOTE — Progress Notes (Signed)
Pt BP went up to 146/108. On call hospitalist paged with no response. The WBC is 3.8. Will continue to monitor.  Lupita Dawn, RN

## 2018-06-08 ENCOUNTER — Inpatient Hospital Stay (HOSPITAL_COMMUNITY): Payer: Self-pay

## 2018-06-08 DIAGNOSIS — M25512 Pain in left shoulder: Secondary | ICD-10-CM

## 2018-06-08 LAB — URINE CULTURE: Culture: <10000 — AB

## 2018-06-08 MED ORDER — MORPHINE SULFATE (PF) 2 MG/ML IV SOLN
2.0000 mg | Freq: Once | INTRAVENOUS | Status: AC
  Administered 2018-06-08: 2 mg via INTRAVENOUS
  Filled 2018-06-08: qty 1

## 2018-06-08 MED ORDER — HYDRALAZINE HCL 20 MG/ML IJ SOLN
10.0000 mg | Freq: Four times a day (QID) | INTRAMUSCULAR | Status: DC | PRN
  Administered 2018-06-08 (×2): 10 mg via INTRAVENOUS
  Filled 2018-06-08 (×2): qty 1

## 2018-06-08 MED ORDER — TRAMADOL HCL 50 MG PO TABS
100.0000 mg | ORAL_TABLET | Freq: Four times a day (QID) | ORAL | Status: DC | PRN
  Administered 2018-06-08 – 2018-06-09 (×3): 100 mg via ORAL
  Filled 2018-06-08 (×3): qty 2

## 2018-06-08 MED ORDER — HYDROMORPHONE HCL 1 MG/ML IJ SOLN
0.5000 mg | Freq: Once | INTRAMUSCULAR | Status: AC
  Administered 2018-06-08: 0.5 mg via INTRAVENOUS
  Filled 2018-06-08: qty 1

## 2018-06-08 NOTE — Progress Notes (Signed)
BP was 154/116 manually. Notified on call hospitalist who ordered IV Hydralazine 10 mg every 6 hours as needed for SBP > 170 and/or DBP > 100. BP was down to 141/101 an hour later. Will continue to monitor.  Lupita Dawn, RN

## 2018-06-08 NOTE — Progress Notes (Signed)
Physical Therapy Treatment Patient Details Name: [name] [name] MRN: [id number] DOB: [birth year] Today's Date: 06/08/2018    History of Present Illness Pt is a 22 y.o. F with significant PMH of SLE, hypertension, gout, iron deficiency anemia, presenting to ED after getting lower GI with chest pain, hypoglycemia, shortness of breath, and fever.    PT Comments    Patient progressing with ambulation and able to negotiate stairs.  Limited some by shoulder pain and tachycardia throughout treatment with max HR 143.  Feel she should progress to be safe for home without PT follow up.  Will continue skilled PT during acute stay.   Follow Up Recommendations  No PT follow up     Equipment Recommendations  None recommended by PT    Recommendations for Other Services       Precautions / Restrictions Precautions Precautions: Other (comment) Precaution Comments: watch HR    Mobility  Bed Mobility Overal bed mobility: Modified Independent                Transfers   Equipment used: None Transfers: Sit to/from Stand Sit to Stand: Supervision         General transfer comment: assist for lines/safety  Ambulation/Gait Ambulation/Gait assistance: Supervision;Min guard Gait Distance (Feet): 250 Feet Assistive device: None Gait Pattern/deviations: Step-through pattern;WFL(Within Functional Limits)     General Gait Details: occasional assist for safety; HR max 143   Stairs Stairs: Yes Stairs assistance: Min assist Stair Management: One rail Right;Forwards;Step to pattern Number of Stairs: 3 General stair comments: slower to descend with HHA   Wheelchair Mobility    Modified Rankin (Stroke Patients Only)       Balance Overall balance assessment: Mild deficits observed, not formally tested                                          Cognition Arousal/Alertness: Awake/alert Behavior During Therapy: WFL for tasks assessed/performed;Flat affect Overall  Cognitive Status: Within Functional Limits for tasks assessed                                        Exercises      General Comments General comments (skin integrity, edema, etc.): stood to wash hands and brush teeth at sink and managed toileting with intermittent S      Pertinent Vitals/Pain Faces Pain Scale: Hurts whole lot Pain Location: bilateral shoulders Pain Descriptors / Indicators: Sore;Aching Pain Intervention(s): Monitored during session;Repositioned;Patient requesting pain meds-RN notified    Home Living                      Prior Function            PT Goals (current goals can now be found in the care plan section) Progress towards PT goals: Progressing toward goals    Frequency    Min 3X/week      PT Plan Current plan remains appropriate    Co-evaluation              AM-PAC PT "6 Clicks" Daily Activity  Outcome Measure  Difficulty turning over in bed (including adjusting bedclothes, sheets and blankets)?: None Difficulty moving from lying on back to sitting on the side of the bed? : None Difficulty sitting down on and standing up  from a chair with arms (e.g., wheelchair, bedside commode, etc,.)?: A Little Help needed moving to and from a bed to chair (including a wheelchair)?: A Little Help needed walking in hospital room?: A Little Help needed climbing 3-5 steps with a railing? : A Little 6 Click Score: 20    End of Session Equipment Utilized During Treatment: Gait belt Activity Tolerance: Patient tolerated treatment well(but tachy throughout) Patient left: with call bell/phone within reach;in chair;Other (comment);with nursing/sitter in room(arms on pillows and towel roll at neck)   PT Visit Diagnosis: Unsteadiness on feet (R26.81);Pain Pain - Right/Left: Left(both) Pain - part of body: Shoulder     Time: 4373-5789 PT Time Calculation (min) (ACUTE ONLY): 23 min  Charges:  $Gait Training: 8-22  mins $Therapeutic Activity: 8-22 mins                     Magda Kiel, PT Acute Rehabilitation Services [phone] 06/08/2018    Reginia Naas 06/08/2018, 1:12 PM

## 2018-06-08 NOTE — Progress Notes (Signed)
Pt complained of tender pain in bilateral shoulders at a level 8/10. Received the ordered 1 mg Morphine which did not ease pain.  The on call physician was notified and ordered a one time IV 2 mg Morphine. Will continue to monitor.  Lupita Dawn, RN

## 2018-06-08 NOTE — Progress Notes (Signed)
Pain in L shoulder increased to level 10, pt stated she could not stand the pain.  On call physician notified the additional Morphine was ineffective. He then ordered a one time IV Dilaudid 0.5 mg. Will continue to monitor.  Lupita Dawn, RN

## 2018-06-08 NOTE — Progress Notes (Signed)
[facility] PROGRESS NOTE    Progress Note  [name] [name]  KWI:[id number] DOB: [birth year] DOA: 06/05/2018 PCP: [name], FNP     Brief Narrative:   [name] [name] is an 22 y.o. female with a history of SLE, hypertension, gout, iron deficiency anemia, prior to the emergency department after being seen at the lower GI after her EGD, complaining of chest pain, hypoglycemia, shortness of breath, and fever up to 102.  She also had yellow sputum for about 2 weeks.  CT angio was negative for PE.  However, she was found to have suspected pneumonia  Assessment/Plan:   Lobar pneumonia ([place]): Has remained afebrile. Culture data has remained negative till date. Tylenol for fevers. HIV and strep PNeumo negative. She continues to have difficulty swallowing, not eating much due to the dysphagia.  Continue IV empiric antibiotics  Dysphagia probably due to multiple ulcers see on EGD: EGD on 9.24 showed 3 ulcer on the hard palate of oropharynx. Cont diflucan and viscous lidocaine, norco for pain. Still with pain with swallowing. Cont liq diet.  SLE: Cont steroids.    IDA (iron deficiency anemia) hbg stable cont to monitor.  Mucositis oral Cont steroids.  Hypoglycemia without diagnosis of diabetes mellitus  Left shoulder pain: We will get a x-ray of the shoulder   DVT prophylaxis: lovexno Family Communication:mother Disposition Plan/Barrier to D/C: Once pain better controlled and able to tolerate orals.   Code Status:     Code Status Orders  (From admission, onward)         Start     Ordered   06/05/18 1527  Full code  Continuous     06/05/18 1527        Code Status History    Date Active Date Inactive Code Status Order ID Comments User Context   01/08/2018 2308 01/13/2018 2009 Full Code [id number]  Etta Quill, DO ED        IV Access:    Peripheral IV   Procedures and diagnostic studies:   No results found.   Medical Consultants:     None.  Anti-Infectives:   Rocpehin and azithro  Subjective:    [name] [name] difficulty swallowing due to pain, only eating about 25% of the food.  Objective:    Vitals:   06/08/18 0446 06/08/18 0804 06/08/18 1032 06/08/18 1033  BP: (!) 139/101 (!) 149/106  (!) 150/116  Pulse: 80   (!) 105  Resp: (!) 21 15  20   Temp: 98.7 F (37.1 C) 98.8 F (37.1 C)    TempSrc: Oral Oral    SpO2: 100% 99% 98% 98%  Weight:      Height:        Intake/Output Summary (Last 24 hours) at 06/08/2018 1250 Last data filed at 06/08/2018 0400 Gross per 24 hour  Intake 3287.5 ml  Output -  Net 3287.5 ml   Filed Weights   06/05/18 1747 06/06/18 2021  Weight: 69.4 kg 70.6 kg    Exam: General exam: In no acute distress. Respiratory system: Good air movement and clear to auscultation. Cardiovascular system: S1 & S2 heard, RRR. No JVD. Gastrointestinal system: Abdomen is nondistended, soft and nontender, mouth and tongue  ulcer Central nervous system: Alert and oriented. No focal neurological deficits. Extremities: No pedal edema. Skin: No rashes, lesions or ulcers Psychiatry: Judgement and insight appear normal. Mood & affect appropriate.    Data Reviewed:    Labs: Basic Metabolic Panel: Recent Labs  Lab 06/05/18 1225  06/06/18 0008 06/07/18 0333  NA 135 137 137  K 3.8 4.1 3.8  CL 105 110 113*  CO2 20* 19* 18*  GLUCOSE 228* 78 83  BUN 12 7 <5*  CREATININE 0.84 0.63 0.52  CALCIUM 7.9* 7.6* 7.8*  MG 1.7  --   --    GFR Estimated Creatinine Clearance: 102.2 mL/min (by C-G formula based on SCr of 0.52 mg/dL). Liver Function Tests: Recent Labs  Lab 06/05/18 1225  AST 72*  ALT 32  ALKPHOS 49  BILITOT 0.7  PROT 6.4*  ALBUMIN 2.4*   No results for input(s): LIPASE, AMYLASE in the last 168 hours. No results for input(s): AMMONIA in the last 168 hours. Coagulation profile Recent Labs  Lab 06/05/18 1225  INR 0.94    CBC: Recent Labs  Lab 06/05/18 1225  06/06/18 0008 06/07/18 0333  WBC 2.8* 4.2 3.8*  NEUTROABS 2.1  --   --   HGB 9.1* 8.7* 8.7*  HCT 30.2* 27.9* 27.4*  MCV 80.5 81.3 77.8*  PLT 195 175 188   Cardiac Enzymes: Recent Labs  Lab 06/05/18 1225 06/05/18 1838  TROPONINI <0.03 <0.03   BNP (last 3 results) No results for input(s): PROBNP in the last 8760 hours. CBG: Recent Labs  Lab 06/05/18 1225 06/05/18 1245 06/05/18 1347 06/05/18 1405 06/06/18 1957  GLUCAP 21* 163* 88 82 88   D-Dimer: No results for input(s): DDIMER in the last 72 hours. Hgb A1c: No results for input(s): HGBA1C in the last 72 hours. Lipid Profile: No results for input(s): CHOL, HDL, LDLCALC, TRIG, CHOLHDL, LDLDIRECT in the last 72 hours. Thyroid function studies: No results for input(s): TSH, T4TOTAL, T3FREE, THYROIDAB in the last 72 hours.  Invalid input(s): FREET3 Anemia work up: No results for input(s): VITAMINB12, FOLATE, FERRITIN, TIBC, IRON, RETICCTPCT in the last 72 hours. Sepsis Labs: Recent Labs  Lab 06/05/18 1225 06/05/18 1838 06/05/18 2025 06/06/18 0008 06/07/18 0333  PROCALCITON  --  0.17  --   --   --   WBC 2.8*  --   --  4.2 3.8*  LATICACIDVEN  --   --  1.2 0.9  --    Microbiology Recent Results (from the past 240 hour(s))  Group A Strep by PCR     Status: None   Collection Time: 05/30/18  2:52 PM  Result Value Ref Range Status   Group A Strep by PCR NOT DETECTED NOT DETECTED Final    Comment: Performed at [facility], [place] [address], [place], [place] [address]  Culture, blood (x 2)     Status: None (Preliminary result)   Collection Time: 06/05/18  8:15 PM  Result Value Ref Range Status   Specimen Description BLOOD LEFT ANTECUBITAL  Final   Special Requests   Final    BOTTLES DRAWN AEROBIC ONLY Blood Culture adequate volume   Culture   Final    NO GROWTH 3 DAYS Performed at [facility], [place] [address] [place], [place] [address]    Report Status PENDING  Incomplete  Culture, blood  (x 2)     Status: None (Preliminary result)   Collection Time: 06/05/18  8:23 PM  Result Value Ref Range Status   Specimen Description BLOOD LEFT HAND  Final   Special Requests   Final    BOTTLES DRAWN AEROBIC AND ANAEROBIC Blood Culture adequate volume   Culture   Final    NO GROWTH 3 DAYS Performed at [facility], [place]  [address], [place], [place] [address]    Report Status PENDING  Incomplete  Culture, Urine     Status: Abnormal   Collection Time: 06/06/18  7:59 PM  Result Value Ref Range Status   Specimen Description URINE, RANDOM  Final   Special Requests NONE  Final   Culture (A)  Final    <10,000 COLONIES/mL INSIGNIFICANT GROWTH Performed at [facility], [place] [address] [place], [place] [address]    Report Status 06/08/2018 FINAL  Final     Medications:   . amLODipine  2.5 mg Oral Daily  . azithromycin  500 mg Oral Daily  . enoxaparin (LOVENOX) injection  40 mg Subcutaneous Q24H  . fluconazole  200 mg Oral Daily  . folic acid  2 mg Oral Daily  . pantoprazole  20 mg Oral Daily  . predniSONE  10 mg Oral Daily  . sodium chloride flush  3 mL Intravenous Q12H   Continuous Infusions: . sodium chloride 125 mL/hr at 06/08/18 1121  . cefTRIAXone (ROCEPHIN)  IV Stopped (06/07/18 1909)      LOS: 2 days   Charlynne Cousins  [facility] Pager [phone]  *Please refer to [email or web address], password [email or web address] to get updated schedule on who will round on this patient, as hospitalists switch teams weekly. If 7PM-7AM, please contact night-coverage at [email or web address], password [email or web address] for any overnight needs.  06/08/2018, 12:50 PM

## 2018-06-09 ENCOUNTER — Encounter (HOSPITAL_COMMUNITY): Payer: Self-pay | Admitting: Oncology

## 2018-06-09 ENCOUNTER — Emergency Department (HOSPITAL_COMMUNITY): Payer: Self-pay

## 2018-06-09 ENCOUNTER — Observation Stay (HOSPITAL_COMMUNITY)
Admission: EM | Admit: 2018-06-09 | Discharge: 2018-06-11 | Disposition: A | Discharge status: Home / Self Care | Payer: Self-pay | Attending: Internal Medicine | Admitting: Internal Medicine

## 2018-06-09 DIAGNOSIS — R000 Tachycardia, unspecified: Secondary | ICD-10-CM | POA: Insufficient documentation

## 2018-06-09 DIAGNOSIS — D61818 Other pancytopenia: Secondary | ICD-10-CM | POA: Insufficient documentation

## 2018-06-09 DIAGNOSIS — E559 Vitamin D deficiency, unspecified: Secondary | ICD-10-CM | POA: Insufficient documentation

## 2018-06-09 DIAGNOSIS — I7300 Raynaud's syndrome without gangrene: Secondary | ICD-10-CM | POA: Insufficient documentation

## 2018-06-09 DIAGNOSIS — Z8249 Family history of ischemic heart disease and other diseases of the circulatory system: Secondary | ICD-10-CM | POA: Insufficient documentation

## 2018-06-09 DIAGNOSIS — I071 Rheumatic tricuspid insufficiency: Secondary | ICD-10-CM | POA: Insufficient documentation

## 2018-06-09 DIAGNOSIS — R0602 Shortness of breath: Secondary | ICD-10-CM | POA: Insufficient documentation

## 2018-06-09 DIAGNOSIS — E876 Hypokalemia: Principal | ICD-10-CM | POA: Insufficient documentation

## 2018-06-09 DIAGNOSIS — J189 Pneumonia, unspecified organism: Secondary | ICD-10-CM

## 2018-06-09 DIAGNOSIS — D509 Iron deficiency anemia, unspecified: Secondary | ICD-10-CM

## 2018-06-09 DIAGNOSIS — K1230 Oral mucositis (ulcerative), unspecified: Secondary | ICD-10-CM | POA: Insufficient documentation

## 2018-06-09 DIAGNOSIS — J90 Pleural effusion, not elsewhere classified: Secondary | ICD-10-CM | POA: Insufficient documentation

## 2018-06-09 DIAGNOSIS — J181 Lobar pneumonia, unspecified organism: Secondary | ICD-10-CM | POA: Insufficient documentation

## 2018-06-09 DIAGNOSIS — M109 Gout, unspecified: Secondary | ICD-10-CM | POA: Insufficient documentation

## 2018-06-09 DIAGNOSIS — R531 Weakness: Secondary | ICD-10-CM

## 2018-06-09 DIAGNOSIS — I119 Hypertensive heart disease without heart failure: Secondary | ICD-10-CM | POA: Insufficient documentation

## 2018-06-09 DIAGNOSIS — E162 Hypoglycemia, unspecified: Secondary | ICD-10-CM | POA: Insufficient documentation

## 2018-06-09 DIAGNOSIS — Z91040 Latex allergy status: Secondary | ICD-10-CM | POA: Insufficient documentation

## 2018-06-09 DIAGNOSIS — Z79899 Other long term (current) drug therapy: Secondary | ICD-10-CM | POA: Insufficient documentation

## 2018-06-09 DIAGNOSIS — E2740 Unspecified adrenocortical insufficiency: Secondary | ICD-10-CM

## 2018-06-09 DIAGNOSIS — D649 Anemia, unspecified: Secondary | ICD-10-CM | POA: Diagnosis present

## 2018-06-09 DIAGNOSIS — R1310 Dysphagia, unspecified: Secondary | ICD-10-CM

## 2018-06-09 DIAGNOSIS — Z888 Allergy status to other drugs, medicaments and biological substances status: Secondary | ICD-10-CM | POA: Insufficient documentation

## 2018-06-09 DIAGNOSIS — D72819 Decreased white blood cell count, unspecified: Secondary | ICD-10-CM | POA: Diagnosis present

## 2018-06-09 DIAGNOSIS — M329 Systemic lupus erythematosus, unspecified: Secondary | ICD-10-CM | POA: Insufficient documentation

## 2018-06-09 DIAGNOSIS — E2749 Other adrenocortical insufficiency: Secondary | ICD-10-CM | POA: Insufficient documentation

## 2018-06-09 LAB — COMPREHENSIVE METABOLIC PANEL WITH GFR
ALT: 32 U/L (ref 0–44)
AST: 80 U/L — ABNORMAL HIGH (ref 15–41)
Albumin: 2.4 g/dL — ABNORMAL LOW (ref 3.5–5.0)
Alkaline Phosphatase: 57 U/L (ref 38–126)
Anion gap: 8 (ref 5–15)
BUN: <5 mg/dL — ABNORMAL LOW (ref 6–20)
CO2: 20 mmol/L — ABNORMAL LOW (ref 22–32)
Calcium: 7.9 mg/dL — ABNORMAL LOW (ref 8.9–10.3)
Chloride: 109 mmol/L (ref 98–111)
Creatinine, Ser: 0.51 mg/dL (ref 0.44–1.00)
GFR calc Af Amer: >60 mL/min
GFR calc non Af Amer: >60 mL/min
Glucose, Bld: 111 mg/dL — ABNORMAL HIGH (ref 70–99)
Potassium: 2.7 mmol/L — CL (ref 3.5–5.1)
Sodium: 137 mmol/L (ref 135–145)
Total Bilirubin: 0.4 mg/dL (ref 0.3–1.2)
Total Protein: 6.4 g/dL — ABNORMAL LOW (ref 6.5–8.1)

## 2018-06-09 LAB — I-STAT CG4 LACTIC ACID, ED: LACTIC ACID, VENOUS: 1.2 mmol/L (ref 0.5–1.9)

## 2018-06-09 MED ORDER — LEVOFLOXACIN 500 MG PO TABS: 500.0000 mg | ORAL_TABLET | Freq: Every day | ORAL | 0 refills | Status: DC

## 2018-06-09 MED ORDER — SODIUM CHLORIDE 0.9 % IV BOLUS
1000.0000 mL | Freq: Once | INTRAVENOUS | Status: AC
  Administered 2018-06-09: 1000 mL via INTRAVENOUS

## 2018-06-09 MED ORDER — ACETAMINOPHEN 500 MG PO TABS
1000.0000 mg | ORAL_TABLET | Freq: Once | ORAL | Status: DC
  Filled 2018-06-09: qty 2

## 2018-06-09 MED ORDER — POTASSIUM CHLORIDE IN NACL 20-0.9 MEQ/L-% IV SOLN
INTRAVENOUS | Status: DC
  Administered 2018-06-10 (×2): via INTRAVENOUS
  Filled 2018-06-09 (×3): qty 1000

## 2018-06-09 MED ORDER — POTASSIUM CHLORIDE 10 MEQ/100ML IV SOLN
10.0000 meq | INTRAVENOUS | Status: AC
  Administered 2018-06-10 (×4): 10 meq via INTRAVENOUS
  Filled 2018-06-09 (×4): qty 100

## 2018-06-09 MED ORDER — POTASSIUM CHLORIDE 10 MEQ/100ML IV SOLN: 10.0000 meq | Freq: Once | INTRAVENOUS | Status: DC

## 2018-06-09 MED ORDER — FENTANYL CITRATE (PF) 100 MCG/2ML IJ SOLN
50.0000 ug | INTRAMUSCULAR | Status: DC | PRN
  Administered 2018-06-09 – 2018-06-10 (×2): 50 ug via INTRAVENOUS
  Filled 2018-06-09 (×2): qty 2

## 2018-06-09 MED ORDER — DIPHENHYDRAMINE HCL 50 MG/ML IJ SOLN
50.0000 mg | Freq: Once | INTRAMUSCULAR | Status: AC
  Administered 2018-06-09: 50 mg via INTRAVENOUS
  Filled 2018-06-09: qty 1

## 2018-06-09 MED ORDER — LEVOFLOXACIN 500 MG PO TABS: 500.0000 mg | ORAL_TABLET | Freq: Every day | ORAL | 0 refills | Status: AC

## 2018-06-09 MED ORDER — LIDOCAINE VISCOUS HCL 2 % MT SOLN: 15.0000 mL | OROMUCOSAL | 0 refills | Status: DC | PRN

## 2018-06-09 NOTE — ED Triage Notes (Signed)
Pt bib [facility] from home d/t shob, cough, CP, generalized body aches, weakness and poor po intake.  Pt d/c'd from hospital today.  Initial CBG of 53, pt given 15 g oral glucose by fire.  EMS gave pt 1000 mg tylenol, 10 mg albuterol, 0.5 mg atrovent and 125 mg solumedrol.  Pt's lips are swollen and states her tongue feels like it is burning.  Left arm is swollen.

## 2018-06-09 NOTE — Discharge Summary (Signed)
Physician Discharge Summary  [name] [name] WPY:[id number] DOB: 08-May-1996 DOA: 06/05/2018  PCP: [name], FNP  Admit date: 06/05/2018 Discharge date: 06/09/2018  Admitted From: home Disposition:  Home  Recommendations for Outpatient Follow-up:  1. Follow up with PCP in 1-2 weeks 2.   Home Health:No Equipment/Devices:none  Discharge Condition:stable CODE STATUS:full Diet recommendation: Heart Health  Brief/Interim Summary:  22 y.o. female with a history of SLE, hypertension, gout, iron deficiency anemia, prior to the emergency department after being seen at the [facility] after her EGD, complaining of chest pain, hypoglycemia, shortness of breath, and fever up to 102. She also had yellow sputum for about 2 weeks. CT angiowas negative for PE. However, she was found to have suspected pneumonia  Discharge Diagnoses:  Principal Problem:   Lobar pneumonia ([place]) Active Problems:   IDA (iron deficiency anemia)   SLE (systemic lupus erythematosus) (HCC)   Mucositis oral   Dysphagia   Pleuritic chest pain   Hypoglycemia without diagnosis of diabetes mellitus   Fever  Lobar pneumonia: She was started on admission empirically on IV Rocephin and azithromycin culture data remain negative if HIV and strep pneumo were negative. When she defervesced and was able to tolerate oral she was changed to oral Levaquin she should continue at home as an outpatient.  Dysphagia probably due to multiple ulcers: She status post EGD on 06/05/2018 that showed multiple ulcers in the hard palates and oropharynx, she was started on Diflucan and viscous lidocaine gel, and she was continue home dose of Norco for pain. She was able to tolerate her diet on the day of discharge.  SLE: Continue steroids she has significant lesions in her hard palate sending her skin question if this transforming or his discoid lupus.  Iron deficiency anemia: Continue ferrous sulfate. Discharge Instructions  Discharge  Instructions    Diet - low sodium heart healthy   Complete by:  As directed    Increase activity slowly   Complete by:  As directed      Allergies as of 06/09/2018      Reactions   Hydroxychloroquine Rash   Latex Rash      Medication List    STOP taking these medications   fluconazole 200 MG tablet Commonly known as:  DIFLUCAN     TAKE these medications   AMBULATORY NON FORMULARY MEDICATION Medication Name: GI Cocktail 90 ml- viscvous lidocaine 2 % 90 ml Dicyclomine 10 / 5 ml 270 Maalox Total ml's is 450 ml's Take 30 cc's by mouth every 4-6 hours.   amLODipine 2.5 MG tablet Commonly known as:  NORVASC Take 2.5 mg by mouth daily.   ferrous sulfate 325 (65 FE) MG tablet Take 1 tablet (325 mg total) by mouth 2 (two) times daily. With 1/2 glass of orange juice   folic acid 1 MG tablet Commonly known as:  FOLVITE TAKE 2 TABLETS (2 MG TOTAL) BY MOUTH DAILY.   HYDROcodone-acetaminophen 5-325 MG tablet Commonly known as:  NORCO/VICODIN Take 1-2 tablets by mouth every 6 (six) hours as needed.   levofloxacin 500 MG tablet Commonly known as:  LEVAQUIN Take 1 tablet (500 mg total) by mouth daily for 10 days.   lidocaine 2 % solution Commonly known as:  XYLOCAINE Use as directed 15 mLs in the mouth or throat every 4 (four) hours as needed for mouth pain.   medroxyPROGESTERone 150 MG/ML injection Commonly known as:  DEPO-PROVERA Inject 150 mg into the muscle every 3 (three) months.   nystatin  100000 UNIT/ML suspension Commonly known as:  MYCOSTATIN Take 5 mLs by mouth 4 (four) times daily.   pantoprazole 20 MG tablet Commonly known as:  PROTONIX Take 1 tablet (20 mg total) by mouth daily.   predniSONE 5 MG tablet Commonly known as:  DELTASONE Take 10 mg by mouth daily.   Vitamin D (Ergocalciferol) 50000 units Caps capsule Commonly known as:  DRISDOL Take 1 capsule (50,000 Units total) by mouth 2 (two) times a week. What changed:  when to take this        Allergies  Allergen Reactions  . Hydroxychloroquine Rash  . Latex Rash    Consultations:  None   Procedures/Studies: Dg Chest 2 View  Result Date: 05/30/2018 CLINICAL DATA:  Bilateral shoulder and back pain for 3 days. No known injury. Shortness of breath. EXAM: CHEST - 2 VIEW COMPARISON:  10/01/2017 and 05/11/2016. FINDINGS: The heart size and mediastinal contours are normal. The lungs are clear. There is no pleural effusion or pneumothorax. No acute osseous findings are identified. IMPRESSION: No active cardiopulmonary process. Electronically Signed   By: Richardean Sale M.D.   On: 05/30/2018 14:30   Ct Angio Chest Pe W Or Wo Contrast  Result Date: 06/05/2018 CLINICAL DATA:  Chest pain and shortness of breath. EXAM: CT ANGIOGRAPHY CHEST WITH CONTRAST TECHNIQUE: Multidetector CT imaging of the chest was performed using the standard protocol during bolus administration of intravenous contrast. Multiplanar CT image reconstructions and MIPs were obtained to evaluate the vascular anatomy. CONTRAST:  119mL ISOVUE-370 IOPAMIDOL (ISOVUE-370) INJECTION 76% COMPARISON:  CTA chest abdomen pelvis 05/30/2018 FINDINGS: Cardiovascular: --Pulmonary arteries: Contrast injection is sufficient to demonstrate satisfactory opacification of the pulmonary arteries to the segmental level. There is no pulmonary embolus. The main pulmonary artery is within normal limits for size. --Aorta: Satisfactory opacification of the thoracic aorta. No aortic dissection or other acute aortic syndrome. Normal variant aortic arch branching pattern with the left vertebral artery arising independently from the aortic arch. The aortic course and caliber are normal. There is no aortic atherosclerosis. --Heart: Mild cardiomegaly.  No pericardial effusion. Mediastinum/Nodes: No mediastinal, hilar or axillary lymphadenopathy. The visualized thyroid and thoracic esophageal course are unremarkable. Lungs/Pleura: Left basilar opacities are  new compared to 05/30/2018. No pleural effusion or pneumothorax. Upper Abdomen: Contrast bolus timing is not optimized for evaluation of the abdominal organs. Within this limitation, the visualized organs of the upper abdomen are normal. Musculoskeletal: No chest wall abnormality. No acute or significant osseous findings. Review of the MIP images confirms the above findings. IMPRESSION: 1. No pulmonary embolus or acute aortic syndrome. 2. New opacities in the left lower lobe compared to 05/30/2018, which may indicate atelectasis or pneumonia. Electronically Signed   By: Ulyses Jarred M.D.   On: 06/05/2018 17:15   Dg Chest Portable 1 View  Result Date: 06/05/2018 CLINICAL DATA:  Short of breath, left chest pain, some right arm numbness today, history of lupus EXAM: PORTABLE CHEST 1 VIEW COMPARISON:  CT chest of 05/30/2017 and chest x-ray of the same day FINDINGS: There is parenchymal opacity medially at the left lung base with some elevation of the left hemidiaphragm. This opacity may be due to atelectasis, but developing known pneumonia cannot be excluded. Follow-up is recommended if clinically warranted. The right lung appears clear. The heart is within upper limits of normal. No bony abnormality is seen. IMPRESSION: Prominent markings medially at the left lung base may be due to atelectasis, but developing pneumonia cannot be excluded. Consider follow-up if  warranted clinically. Electronically Signed   By: Ivar Drape M.D.   On: 06/05/2018 11:52   Dg Shoulder Left  Result Date: 06/08/2018 CLINICAL DATA:  Left shoulder pain for 1 week, no known injury, initial encounter EXAM: LEFT SHOULDER - 2+ VIEW COMPARISON:  03/18/2018 FINDINGS: No acute fracture or dislocation is noted. Some left basilar atelectasis is noted. This is similar to recent CT examination and may correspond with patient's symptomatology. IMPRESSION: No acute shoulder abnormality is noted. Atelectatic changes in the left base are noted.  Electronically Signed   By: Inez Catalina M.D.   On: 06/08/2018 14:32   Ct Angio Chest/abd/pel For Dissection W And/or Wo Contrast  Result Date: 05/30/2018 CLINICAL DATA:  Back pain and bilateral shoulder pain. Shortness of breath. History of lupus. EXAM: CT ANGIOGRAPHY CHEST, ABDOMEN AND PELVIS TECHNIQUE: Multidetector CT imaging through the chest, abdomen and pelvis was performed using the standard protocol during bolus administration of intravenous contrast. Multiplanar reconstructed images and MIPs were obtained and reviewed to evaluate the vascular anatomy. CONTRAST:  144mL ISOVUE-370 IOPAMIDOL (ISOVUE-370) INJECTION 76% COMPARISON:  None. FINDINGS: CTA CHEST FINDINGS Cardiovascular: The thoracic aorta and pulmonary arteries are well opacified. The thoracic aorta is of normal caliber. There is no evidence of dissection or vasculitis. No significant atherosclerosis. Incidental normal variant separate origin of the left vertebral artery off of the aortic arch. No evidence of pulmonary embolism. Central pulmonary arteries are normal in caliber. The heart size is within normal limits. No pericardial fluid. Mediastinum/Nodes: No enlarged mediastinal, hilar, or axillary lymph nodes. Thyroid gland, trachea, and esophagus demonstrate no significant findings. Lungs/Pleura: There is no evidence of pulmonary edema, consolidation, pneumothorax, nodule or pleural fluid. Musculoskeletal: No chest wall abnormality. No acute or significant osseous findings. Review of the MIP images confirms the above findings. CTA ABDOMEN AND PELVIS FINDINGS VASCULAR Aorta: Normal caliber abdominal aorta. No evidence of dissection or vasculitis. Celiac: Normally patent. SMA: Normally patent.  Normal variant replaced right hepatic artery. Renals: Single left renal artery and 2 separate right renal arteries show normal patency. IMA: Normally patent. Inflow: Normally patent bilateral common, external and internal iliac arteries. Normally  patent bilateral common femoral arteries and femoral bifurcations. Review of the MIP images confirms the above findings. NON-VASCULAR Hepatobiliary: The liver demonstrates diffuse steatosis. Gallbladder and bile ducts appear unremarkable. Pancreas: Unremarkable. No pancreatic ductal dilatation or surrounding inflammatory changes. Spleen: Normal in size without focal abnormality. Adrenals/Urinary Tract: Adrenal glands are unremarkable. Kidneys are normal, without renal calculi, focal lesion, or hydronephrosis. Bladder is unremarkable. Stomach/Bowel: Bowel shows no evidence of obstruction or inflammation. No free air identified. Lymphatic: No enlarged lymph nodes identified. Reproductive: Uterus and bilateral adnexa are unremarkable. Other: No abdominal wall hernia or abnormality. No abdominopelvic ascites. Musculoskeletal: No acute or significant osseous findings. Review of the MIP images confirms the above findings. IMPRESSION: 1. Normal CTA of the chest, abdomen and pelvis. No acute aortic pathology. 2. No evidence of pulmonary embolism. 3. Diffuse hepatic steatosis. Electronically Signed   By: Aletta Edouard M.D.   On: 05/30/2018 18:07   Subjective: No complinas  Discharge Exam: Vitals:   06/09/18 0522 06/09/18 0822  BP: (!) 155/94 (!) 146/101  Pulse: (!) 120   Resp: 18 20  Temp: 99.7 F (37.6 C) 99.2 F (37.3 C)  SpO2: 98% 98%   Vitals:   06/08/18 1603 06/08/18 2102 06/09/18 0522 06/09/18 0822  BP: (!) 148/104 (!) 132/91 (!) 155/94 (!) 146/101  Pulse:  (!) 116 (!) 120  Resp: (!) 33 (!) 24 18 20   Temp: 99.1 F (37.3 C) 99.6 F (37.6 C) 99.7 F (37.6 C) 99.2 F (37.3 C)  TempSrc: Oral Oral Oral Oral  SpO2:  95% 98% 98%  Weight:      Height:        General: Pt is alert, awake, not in acute distress Cardiovascular: RRR, S1/S2 +, no rubs, no gallops Respiratory: CTA bilaterally, no wheezing, no rhonchi Abdominal: Soft, NT, ND, bowel sounds + Extremities: no edema, no  cyanosis    The results of significant diagnostics from this hospitalization (including imaging, microbiology, ancillary and laboratory) are listed below for reference.     Microbiology: Recent Results (from the past 240 hour(s))  Group A Strep by PCR     Status: None   Collection Time: 05/30/18  2:52 PM  Result Value Ref Range Status   Group A Strep by PCR NOT DETECTED NOT DETECTED Final    Comment: Performed at [facility], [place] [address], [place], [place] [address]  Culture, blood (x 2)     Status: None (Preliminary result)   Collection Time: 06/05/18  8:15 PM  Result Value Ref Range Status   Specimen Description BLOOD LEFT ANTECUBITAL  Final   Special Requests   Final    BOTTLES DRAWN AEROBIC ONLY Blood Culture adequate volume   Culture   Final    NO GROWTH 3 DAYS Performed at [facility], [place] [address] [place], [place] [address]    Report Status PENDING  Incomplete  Culture, blood (x 2)     Status: None (Preliminary result)   Collection Time: 06/05/18  8:23 PM  Result Value Ref Range Status   Specimen Description BLOOD LEFT HAND  Final   Special Requests   Final    BOTTLES DRAWN AEROBIC AND ANAEROBIC Blood Culture adequate volume   Culture   Final    NO GROWTH 3 DAYS Performed at [facility], [place] [address] [place], [place] [address]    Report Status PENDING  Incomplete  Culture, Urine     Status: Abnormal   Collection Time: 06/06/18  7:59 PM  Result Value Ref Range Status   Specimen Description URINE, RANDOM  Final   Special Requests NONE  Final   Culture (A)  Final    <10,000 COLONIES/mL INSIGNIFICANT GROWTH Performed at [facility], [place] [address], [place],  [address]    Report Status 06/08/2018 FINAL  Final     Labs: BNP (last 3 results) No results for input(s): BNP in the last 8760 hours. Basic Metabolic Panel: Recent Labs  Lab 06/05/18 1225 06/06/18 0008 06/07/18 0333  NA 135 137 137  K 3.8 4.1 3.8   CL 105 110 113*  CO2 20* 19* 18*  GLUCOSE 228* 78 83  BUN 12 7 <5*  CREATININE 0.84 0.63 0.52  CALCIUM 7.9* 7.6* 7.8*  MG 1.7  --   --    Liver Function Tests: Recent Labs  Lab 06/05/18 1225  AST 72*  ALT 32  ALKPHOS 49  BILITOT 0.7  PROT 6.4*  ALBUMIN 2.4*   No results for input(s): LIPASE, AMYLASE in the last 168 hours. No results for input(s): AMMONIA in the last 168 hours. CBC: Recent Labs  Lab 06/05/18 1225 06/06/18 0008 06/07/18 0333  WBC 2.8* 4.2 3.8*  NEUTROABS 2.1  --   --   HGB 9.1* 8.7* 8.7*  HCT 30.2* 27.9* 27.4*  MCV 80.5  81.3 77.8*  PLT 195 175 188   Cardiac Enzymes: Recent Labs  Lab 06/05/18 1225 06/05/18 1838  TROPONINI <0.03 <0.03   BNP: Invalid input(s): POCBNP CBG: Recent Labs  Lab 06/05/18 1225 06/05/18 1245 06/05/18 1347 06/05/18 1405 06/06/18 1957  GLUCAP 21* 163* 88 82 88   D-Dimer No results for input(s): DDIMER in the last 72 hours. Hgb A1c No results for input(s): HGBA1C in the last 72 hours. Lipid Profile No results for input(s): CHOL, HDL, LDLCALC, TRIG, CHOLHDL, LDLDIRECT in the last 72 hours. Thyroid function studies No results for input(s): TSH, T4TOTAL, T3FREE, THYROIDAB in the last 72 hours.  Invalid input(s): FREET3 Anemia work up No results for input(s): VITAMINB12, FOLATE, FERRITIN, TIBC, IRON, RETICCTPCT in the last 72 hours. Urinalysis    Component Value Date/Time   COLORURINE YELLOW 06/07/2018 0214   APPEARANCEUR HAZY (A) 06/07/2018 0214   LABSPEC 1.019 06/07/2018 0214   PHURINE 6.0 06/07/2018 0214   GLUCOSEU NEGATIVE 06/07/2018 0214   HGBUR LARGE (A) 06/07/2018 0214   BILIRUBINUR NEGATIVE 06/07/2018 0214   BILIRUBINUR neg 04/23/2018 1317   KETONESUR 5 (A) 06/07/2018 0214   PROTEINUR 100 (A) 06/07/2018 0214   UROBILINOGEN 0.2 04/23/2018 1317   NITRITE NEGATIVE 06/07/2018 0214   LEUKOCYTESUR SMALL (A) 06/07/2018 0214   Sepsis Labs Invalid input(s): PROCALCITONIN,  WBC,   LACTICIDVEN Microbiology Recent Results (from the past 240 hour(s))  Group A Strep by PCR     Status: None   Collection Time: 05/30/18  2:52 PM  Result Value Ref Range Status   Group A Strep by PCR NOT DETECTED NOT DETECTED Final    Comment: Performed at [facility], [place] [address] [place], [place] [address]  Culture, blood (x 2)     Status: None (Preliminary result)   Collection Time: 06/05/18  8:15 PM  Result Value Ref Range Status   Specimen Description BLOOD LEFT ANTECUBITAL  Final   Special Requests   Final    BOTTLES DRAWN AEROBIC ONLY Blood Culture adequate volume   Culture   Final    NO GROWTH 3 DAYS Performed at [facility], [place] [address], [place], [place] [address]    Report Status PENDING  Incomplete  Culture, blood (x 2)     Status: None (Preliminary result)   Collection Time: 06/05/18  8:23 PM  Result Value Ref Range Status   Specimen Description BLOOD LEFT HAND  Final   Special Requests   Final    BOTTLES DRAWN AEROBIC AND ANAEROBIC Blood Culture adequate volume   Culture   Final    NO GROWTH 3 DAYS Performed at [facility], [place] [address], [place], [place] [address]    Report Status PENDING  Incomplete  Culture, Urine     Status: Abnormal   Collection Time: 06/06/18  7:59 PM  Result Value Ref Range Status   Specimen Description URINE, RANDOM  Final   Special Requests NONE  Final   Culture (A)  Final    <10,000 COLONIES/mL INSIGNIFICANT GROWTH Performed at [facility], [place] [address] [place], [place] [address]    Report Status 06/08/2018 FINAL  Final     Time coordinating discharge: 35 minutes  SIGNED:   Charlynne Cousins, MD  [facility] 06/09/2018, 9:48 AM Pager   If 7PM-7AM, please contact night-coverage [email or web address] Password [email or web address]

## 2018-06-09 NOTE — ED Provider Notes (Signed)
[facility] EMERGENCY DEPARTMENT Provider Note   CSN: [id number] Arrival date & time: 06/09/18  2048     History   Chief Complaint Chief Complaint  Patient presents with  . Shortness of Breath    HPI [name] [name] is a 22 y.o. female.  Patient with history of lupus on prednisone, gout, recent admission for pneumonia hospitalized and released this morning presents for decreased oral intake, chest pain, cough, body aches and facial swelling.  Patient had glucose of 50 for EMS and given oral glucose.  Patient also had albuterol, Atrovent and Solu-Medrol.  Patient denies any medications since being discharged.  Patient feels gradually worse since discharge.  Patient denies blood clot history had a CAT scan for blood clot in the hospital which is negative.  Patient does feel her tongue is burning and lips mild swelling.     Past Medical History:  Diagnosis Date  . Gout   . Hypertension   . Lupus ([place])   . Raynaud disease     Patient Active Problem List   Diagnosis Date Noted  . Lobar pneumonia ([place]) 06/07/2018  . Fever 06/06/2018  . Tachycardia 06/05/2018  . Pleuritic chest pain 06/05/2018  . Hypoglycemia without diagnosis of diabetes mellitus 06/05/2018  . Dysphagia 06/01/2018  . Odynophagia 06/01/2018  . Thrush 06/01/2018  . Mucositis oral 01/12/2018  . Vaginal candidiasis 01/12/2018  . Methotrexate toxicity   . Mouth ulcers   . Leukopenia 01/10/2018  . Accidental methotrexate overdose 01/08/2018  . Depression 02/08/2017  . Primary osteoarthritis of both knees 11/09/2016  . Chronic midline low back pain without sciatica 11/09/2016  . Vitamin D deficiency 11/09/2016  . SLE (systemic lupus erythematosus) ([place]) 09/19/2016  . High risk medication use 09/19/2016  . Insomnia 01/29/2016  . Depo-Provera contraceptive status 08/11/2015  . Raynaud's disease 08/11/2015  . Seasonal allergies 08/11/2015  . IDA (iron deficiency anemia) 08/11/2015    Past  Surgical History:  Procedure Laterality Date  . ORIF SHOULDER FRACTURE Left   . TONSILLECTOMY       OB History   None      Home Medications    Prior to Admission medications   Medication Sig Start Date End Date Taking? Authorizing Provider  AMBULATORY NON FORMULARY MEDICATION Medication Name: GI Cocktail 90 ml- viscvous lidocaine 2 % 90 ml Dicyclomine 10 / 5 ml 270 Maalox Total ml's is 450 ml's Take 30 cc's by mouth every 4-6 hours. Patient not taking: Reported on 06/05/2018 06/01/18   Zehr, Janett Billow D, PA-C  amLODipine (NORVASC) 2.5 MG tablet Take 2.5 mg by mouth daily.    [provider]  ferrous sulfate 325 (65 FE) MG tablet Take 1 tablet (325 mg total) by mouth 2 (two) times daily. With 1/2 glass of orange juice 12/15/15   Davonna Belling, MD  folic acid (FOLVITE) 1 MG tablet TAKE 2 TABLETS (2 MG TOTAL) BY MOUTH DAILY. 12/11/17   Bo Merino, MD  HYDROcodone-acetaminophen (NORCO/VICODIN) 5-325 MG tablet Take 1-2 tablets by mouth every 6 (six) hours as needed. 05/30/18   Langston Masker B, PA-C  levofloxacin (LEVAQUIN) 500 MG tablet Take 1 tablet (500 mg total) by mouth daily for 10 days. 06/09/18 06/19/18  Charlynne Cousins, MD  lidocaine (XYLOCAINE) 2 % solution Use as directed 15 mLs in the mouth or throat every 4 (four) hours as needed for mouth pain. 06/09/18   Charlynne Cousins, MD  medroxyPROGESTERone (DEPO-PROVERA) 150 MG/ML injection Inject 150 mg into the  muscle every 3 (three) months.    [provider]  nystatin (MYCOSTATIN) 100000 UNIT/ML suspension Take 5 mLs by mouth 4 (four) times daily.    [provider]  pantoprazole (PROTONIX) 20 MG tablet Take 1 tablet (20 mg total) by mouth daily. 05/30/18 06/29/18  Langston Masker B, PA-C  predniSONE (DELTASONE) 5 MG tablet Take 10 mg by mouth daily.     [provider]  Vitamin D, Ergocalciferol, (DRISDOL) 50000 units CAPS capsule Take 1 capsule (50,000 Units total) by mouth 2 (two) times  a week. Patient taking differently: Take 50,000 Units by mouth every Sunday.  12/21/17   Bo Merino, MD    Family History Family History  Problem Relation Age of Onset  . Aneurysm Mother   . Stroke Sister   . Seizures Sister   . Lung cancer Maternal Aunt   . Heart attack Paternal Uncle   . Colon cancer Neg Hx   . Esophageal cancer Neg Hx   . Pancreatic cancer Neg Hx   . Stomach cancer Neg Hx     Social History Social History   Tobacco Use  . Smoking status: Never Smoker  . Smokeless tobacco: Never Used  Substance Use Topics  . Alcohol use: No  . Drug use: No     Allergies   Hydroxychloroquine and Latex   Review of Systems Review of Systems  Constitutional: Positive for appetite change and fever. Negative for chills.  HENT: Negative for congestion.   Eyes: Negative for visual disturbance.  Respiratory: Positive for shortness of breath.   Cardiovascular: Negative for chest pain.  Gastrointestinal: Positive for nausea. Negative for abdominal pain and vomiting.  Genitourinary: Negative for dysuria and flank pain.  Musculoskeletal: Negative for back pain, neck pain and neck stiffness.  Skin: Negative for rash.  Neurological: Positive for light-headedness. Negative for headaches.     Physical Exam Updated Vital Signs BP 125/85   Pulse (!) 109   Temp 99.5 F (37.5 C) (Oral)   Resp 19   Ht 5\' 3"  (1.6 m)   Wt 68 kg   LMP 05/30/2018   SpO2 91%   BMI 26.57 kg/m   Physical Exam  Constitutional: She is oriented to person, place, and time. She appears well-developed.  HENT:  Head: Normocephalic and atraumatic.  Mild lip swelling worse left side, no posterior pharyngeal edema  Eyes: Conjunctivae are normal. Right eye exhibits no discharge. Left eye exhibits no discharge.  Neck: Normal range of motion. Neck supple. No tracheal deviation present.  Cardiovascular: Regular rhythm. Tachycardia present.  Pulmonary/Chest: Breath sounds normal. Tachypnea noted.    decr air movement bilateral, few rales  Abdominal: Soft. She exhibits no distension. There is no tenderness. There is no guarding.  Musculoskeletal: She exhibits no edema.       Right lower leg: She exhibits no edema.       Left lower leg: She exhibits no edema.  Neurological: She is alert and oriented to person, place, and time.  Skin: Skin is warm. No rash noted.  Psychiatric: She has a normal mood and affect.  Nursing note and vitals reviewed.    ED Treatments / Results  Labs (all labs ordered are listed, but only abnormal results are displayed) Labs Reviewed  COMPREHENSIVE METABOLIC PANEL - Abnormal; Notable for the following components:      Result Value   Potassium 2.7 (*)    CO2 20 (*)    Glucose, Bld 111 (*)    BUN <5 (*)  Calcium 7.9 (*)    Total Protein 6.4 (*)    Albumin 2.4 (*)    AST 80 (*)    All other components within normal limits  CBC WITH DIFFERENTIAL/PLATELET - Abnormal; Notable for the following components:   WBC 3.6 (*)    RBC 3.62 (*)    Hemoglobin 9.3 (*)    HCT 28.6 (*)    MCH 25.7 (*)    RDW 18.6 (*)    All other components within normal limits  MAGNESIUM  PHOSPHORUS  I-STAT CG4 LACTIC ACID, ED    EKG EKG Interpretation  Date/Time:  Saturday June 09 2018 20:52:23 EDT Ventricular Rate:  122 PR Interval:    QRS Duration: 77 QT Interval:  337 QTC Calculation: 481 R Axis:   49 Text Interpretation:  Sinus tachycardia Borderline T abnormalities, lateral leads Borderline prolonged QT interval Confirmed by Elnora Morrison [id number]) on 06/09/2018 10:50:48 PM   Radiology Dg Chest 2 View  Result Date: 06/09/2018 CLINICAL DATA:  22 year old female with shortness of breath. EXAM: CHEST - 2 VIEW COMPARISON:  Chest CT dated 06/05/2018 FINDINGS: There is a small left pleural effusion with left lung base atelectasis or infiltrate. The right lung is clear. There is no pneumothorax. The cardiac silhouette is within normal limits. No acute osseous  pathology. IMPRESSION: Small left pleural effusion and left lung base atelectasis/infiltrate. Clinical correlation is recommended. Electronically Signed   By: Anner Crete M.D.   On: 06/09/2018 21:55   Dg Shoulder Left  Result Date: 06/08/2018 CLINICAL DATA:  Left shoulder pain for 1 week, no known injury, initial encounter EXAM: LEFT SHOULDER - 2+ VIEW COMPARISON:  03/18/2018 FINDINGS: No acute fracture or dislocation is noted. Some left basilar atelectasis is noted. This is similar to recent CT examination and may correspond with patient's symptomatology. IMPRESSION: No acute shoulder abnormality is noted. Atelectatic changes in the left base are noted. Electronically Signed   By: Inez Catalina M.D.   On: 06/08/2018 14:32    Procedures Procedures (including critical care time)  Medications Ordered in ED Medications  fentaNYL (SUBLIMAZE) injection 50 mcg (50 mcg Intravenous Given 06/09/18 2136)  potassium chloride 10 mEq in 100 mL IVPB (has no administration in time range)  potassium chloride 10 mEq in 100 mL IVPB (has no administration in time range)  sodium chloride 0.9 % bolus 1,000 mL (0 mLs Intravenous Stopped 06/09/18 2241)  diphenhydrAMINE (BENADRYL) injection 50 mg (50 mg Intravenous Given 06/09/18 2129)     Initial Impression / Assessment and Plan / ED Course  I have reviewed the triage vital signs and the nursing notes.  Pertinent labs & imaging results that were available during my care of the patient were reviewed by me and considered in my medical decision making (see chart for details).    Patient with recent discharge this morning presents with worsening symptoms that she was admitted for.  Patient had hypoglycemia and minimal oral intake since being discharged.  Patient had antibiotics in the hospital and is not due to start antibiotics until tomorrow morning.  On exam patient has signs of dehydration/tachycardia/mild tachypnea.  Reviewed recent CT scan that did not show  signs of blood clot.  Plan for screening blood work, lactate, fluid bolus and readmission to hospitalist.  Patient received Benadryl/steroids for mild facial swelling however unknown causes patient has not had any medications since being discharged and she says she had that swelling at discharge. Blood work revealed hypokalemia which is likely contributing to her  weakness in addition to her infection. K ordered.   Patient had antibiotics this morning in the hospital.  Discussed with hospitalist for readmission. The patients results and plan were reviewed and discussed.   Any x-rays performed were independently reviewed by myself.   Differential diagnosis were considered with the presenting HPI.  Medications  fentaNYL (SUBLIMAZE) injection 50 mcg (50 mcg Intravenous Given 06/09/18 2136)  potassium chloride 10 mEq in 100 mL IVPB (has no administration in time range)  potassium chloride 10 mEq in 100 mL IVPB (has no administration in time range)  sodium chloride 0.9 % bolus 1,000 mL (0 mLs Intravenous Stopped 06/09/18 2241)  diphenhydrAMINE (BENADRYL) injection 50 mg (50 mg Intravenous Given 06/09/18 2129)    Vitals:   06/09/18 2106 06/09/18 2130 06/09/18 2200 06/09/18 2315  BP:  125/64 140/88 125/85  Pulse:  (!) 114 (!) 116 (!) 109  Resp:  20 (!) 22 19  Temp:      TempSrc:      SpO2:  97% 98% 91%  Weight: 68 kg     Height: 5\' 3"  (1.6 m)       Final diagnoses:  Community acquired pneumonia, unspecified laterality  Hypoglycemia  General weakness  Hypokalemia    Admission/ observation were discussed with the admitting physician, patient and/or family and they are comfortable with the plan.    Final Clinical Impressions(s) / ED Diagnoses   Final diagnoses:  Community acquired pneumonia, unspecified laterality  Hypoglycemia  General weakness  Hypokalemia    ED Discharge Orders    None       Elnora Morrison, MD 06/09/18 2346

## 2018-06-09 NOTE — Plan of Care (Signed)
  Problem: Health Behavior/Discharge Planning: Goal: Ability to manage health-related needs will improve Outcome: Adequate for Discharge   Problem: Clinical Measurements: Goal: Ability to maintain clinical measurements within normal limits will improve Outcome: Adequate for Discharge Goal: Will remain free from infection Outcome: Adequate for Discharge Goal: Diagnostic test results will improve Outcome: Adequate for Discharge Goal: Respiratory complications will improve Outcome: Adequate for Discharge Goal: Cardiovascular complication will be avoided Outcome: Adequate for Discharge   Problem: Activity: Goal: Risk for activity intolerance will decrease Description Pt requires 1+ assist w/ ambulation and repositioning in bed  Outcome: Adequate for Discharge   Problem: Coping: Goal: Level of anxiety will decrease Outcome: Adequate for Discharge   Problem: Pain Managment: Goal: General experience of comfort will improve Outcome: Adequate for Discharge   Problem: Clinical Measurements: Goal: Ability to maintain a body temperature in the normal range will improve Outcome: Adequate for Discharge   Problem: Respiratory: Goal: Ability to maintain a clear airway will improve Outcome: Adequate for Discharge   Problem: Fluid Volume: Goal: Hemodynamic stability will improve Outcome: Adequate for Discharge   Problem: Clinical Measurements: Goal: Diagnostic test results will improve Outcome: Adequate for Discharge Goal: Signs and symptoms of infection will decrease Outcome: Adequate for Discharge

## 2018-06-09 NOTE — H&P (Signed)
History and Physical    [name] [name] EYC:[id number] DOB: 29-Mar-1996 DOA: 06/09/2018  PCP: [name], FNP   Patient coming from: Home.  I have personally briefly reviewed patient's old medical records in [place]  Chief Complaint: Shortness of breath.  HPI: [name] [name] is a 22 y.o. female with medical history significant of gout, hypertension, SLE, Raynaud's phenomenon who was discharged today after being admitted on 06/05/2018 due to CAP and is returning today due to shortness of breath cough, body aches, nausea, decreased oral intake, 3-4 episodes of loose stools daily and generalized weakness.  She is not sure about having fever, but has had subjective sensation of it associated with chills, fatigue and malaise.  Positive sore throat and glossal pain.  She denies hemoptysis.  Denies chest pain, dizziness, PND, orthopnea or pitting edema of the lower extremities.  She has been getting frequent palpitations.  She denies abdominal pain, emesis, constipation, hematochezia, but her sister stated that she has had some melena (the patient takes daily iron).  She denies dysuria, frequency or hematuria.  ED Course: Initial vital signs temperature 99.5 F, pulse 124, respirations 17, blood pressure 142/89 mmHg and O2 sat 100% on room air.  The patient received a 1000 mL NS bolus and 50 mg of Benadryl IVPB for itching and burning of her posterior glossal and pharyngeal area.  White count was 3.6 with 84% neutrophils, 12% lymphocytes.  Hemoglobin 9.3 g/dL and platelets 157.  Sodium 137, potassium 2.7, chloride 109 and CO2 20 mmol/L.  BUN less than 5, creatinine 0.51, glucose 111, calcium 7.9, magnesium 1.4 and phosphorus 3.3.  Total protein 6.4 albumin 2.4 g/dL.  AST was 80, the rest of the enzymes and bilirubin are normal.  Lactic acid was 1.20 mmol/L.  Her chest radiograph showed LLL infiltrate and mild effusion.  Review of Systems: As per HPI otherwise 10 point review of systems  negative.   Past Medical History:  Diagnosis Date  . Gout   . Hypertension   . Lupus ([place])   . Raynaud disease     Past Surgical History:  Procedure Laterality Date  . ORIF SHOULDER FRACTURE Left   . TONSILLECTOMY       reports that she has never smoked. She has never used smokeless tobacco. She reports that she does not drink alcohol or use drugs.  Allergies  Allergen Reactions  . Hydroxychloroquine Rash  . Latex Rash    Family History  Problem Relation Age of Onset  . Aneurysm Mother   . Stroke Sister   . Seizures Sister   . Lung cancer Maternal Aunt   . Heart attack Paternal Uncle   . Colon cancer Neg Hx   . Esophageal cancer Neg Hx   . Pancreatic cancer Neg Hx   . Stomach cancer Neg Hx     Prior to Admission medications   Medication Sig Start Date End Date Taking? Authorizing Provider  AMBULATORY NON FORMULARY MEDICATION Medication Name: GI Cocktail 90 ml- viscvous lidocaine 2 % 90 ml Dicyclomine 10 / 5 ml 270 Maalox Total ml's is 450 ml's Take 30 cc's by mouth every 4-6 hours. Patient not taking: Reported on 06/05/2018 06/01/18   Zehr, Janett Billow D, PA-C  amLODipine (NORVASC) 2.5 MG tablet Take 2.5 mg by mouth daily.    [provider]  ferrous sulfate 325 (65 FE) MG tablet Take 1 tablet (325 mg total) by mouth 2 (two) times daily. With 1/2 glass of orange juice 12/15/15  Davonna Belling, MD  folic acid (FOLVITE) 1 MG tablet TAKE 2 TABLETS (2 MG TOTAL) BY MOUTH DAILY. 12/11/17   Bo Merino, MD  HYDROcodone-acetaminophen (NORCO/VICODIN) 5-325 MG tablet Take 1-2 tablets by mouth every 6 (six) hours as needed. 05/30/18   Langston Masker B, PA-C  levofloxacin (LEVAQUIN) 500 MG tablet Take 1 tablet (500 mg total) by mouth daily for 10 days. 06/09/18 06/19/18  Charlynne Cousins, MD  lidocaine (XYLOCAINE) 2 % solution Use as directed 15 mLs in the mouth or throat every 4 (four) hours as needed for mouth pain. 06/09/18   Charlynne Cousins, MD    medroxyPROGESTERone (DEPO-PROVERA) 150 MG/ML injection Inject 150 mg into the muscle every 3 (three) months.    [provider]  nystatin (MYCOSTATIN) 100000 UNIT/ML suspension Take 5 mLs by mouth 4 (four) times daily.    [provider]  pantoprazole (PROTONIX) 20 MG tablet Take 1 tablet (20 mg total) by mouth daily. 05/30/18 06/29/18  Langston Masker B, PA-C  predniSONE (DELTASONE) 5 MG tablet Take 10 mg by mouth daily.     [provider]  Vitamin D, Ergocalciferol, (DRISDOL) 50000 units CAPS capsule Take 1 capsule (50,000 Units total) by mouth 2 (two) times a week. Patient taking differently: Take 50,000 Units by mouth every Sunday.  12/21/17   Bo Merino, MD    Physical Exam: Vitals:   06/09/18 2106 06/09/18 2130 06/09/18 2200 06/09/18 2315  BP:  125/64 140/88 125/85  Pulse:  (!) 114 (!) 116 (!) 109  Resp:  20 (!) 22 19  Temp:      TempSrc:      SpO2:  97% 98% 91%  Weight: 68 kg     Height: 5\' 3"  (1.6 m)       Constitutional: Looks acutely ill. Eyes: PERRL, lids and conjunctivae normal ENMT: Mucous membranes are mildly dry. Posterior pharynx clear of any exudate or lesions. Neck: normal, supple, no masses, no thyromegaly Respiratory: Decreased breath sounds on the left base with mild rhonchi and mild wheezing bilaterally, no crackles. Normal respiratory effort. No accessory muscle use.  Cardiovascular: Tachycardic at 112 bpm, 2/6 holosystolic murmur, no rubs / gallops. No extremity edema. 2+ pedal pulses. No carotid bruits.  Abdomen: Soft, no tenderness, no masses palpated. No hepatosplenomegaly. Bowel sounds positive.  Musculoskeletal: no clubbing / cyanosis. Good ROM, no contractures. Normal muscle tone.  Skin: There are some hyperpigmented macules on her back. Neurologic: CN 2-12 grossly intact. Sensation intact, DTR normal. Strength 5/5 in all 4.  Psychiatric: Normal judgment and insight. Alert and oriented x 4. Normal mood.   Labs on  Admission: I have personally reviewed following labs and imaging studies  CBC: Recent Labs  Lab 06/05/18 1225 06/06/18 0008 06/07/18 0333 06/09/18 2203  WBC 2.8* 4.2 3.8* 3.6*  NEUTROABS 2.1  --   --  PENDING  HGB 9.1* 8.7* 8.7* 9.3*  HCT 30.2* 27.9* 27.4* 28.6*  MCV 80.5 81.3 77.8* 79.0  PLT 195 175 188 824   Basic Metabolic Panel: Recent Labs  Lab 06/05/18 1225 06/06/18 0008 06/07/18 0333 06/09/18 2203  NA 135 137 137 137  K 3.8 4.1 3.8 2.7*  CL 105 110 113* 109  CO2 20* 19* 18* 20*  GLUCOSE 228* 78 83 111*  BUN 12 7 <5* <5*  CREATININE 0.84 0.63 0.52 0.51  CALCIUM 7.9* 7.6* 7.8* 7.9*  MG 1.7  --   --   --    GFR: Estimated Creatinine Clearance: 100.3 mL/min (  by C-G formula based on SCr of 0.51 mg/dL). Liver Function Tests: Recent Labs  Lab 06/05/18 1225 06/09/18 2203  AST 72* 80*  ALT 32 32  ALKPHOS 49 57  BILITOT 0.7 0.4  PROT 6.4* 6.4*  ALBUMIN 2.4* 2.4*   No results for input(s): LIPASE, AMYLASE in the last 168 hours. No results for input(s): AMMONIA in the last 168 hours. Coagulation Profile: Recent Labs  Lab 06/05/18 1225  INR 0.94   Cardiac Enzymes: Recent Labs  Lab 06/05/18 1225 06/05/18 1838  TROPONINI <0.03 <0.03   BNP (last 3 results) No results for input(s): PROBNP in the last 8760 hours. HbA1C: No results for input(s): HGBA1C in the last 72 hours. CBG: Recent Labs  Lab 06/05/18 1225 06/05/18 1245 06/05/18 1347 06/05/18 1405 06/06/18 1957  GLUCAP 21* 163* 88 82 88   Lipid Profile: No results for input(s): CHOL, HDL, LDLCALC, TRIG, CHOLHDL, LDLDIRECT in the last 72 hours. Thyroid Function Tests: No results for input(s): TSH, T4TOTAL, FREET4, T3FREE, THYROIDAB in the last 72 hours. Anemia Panel: No results for input(s): VITAMINB12, FOLATE, FERRITIN, TIBC, IRON, RETICCTPCT in the last 72 hours. Urine analysis:    Component Value Date/Time   COLORURINE YELLOW 06/07/2018 0214   APPEARANCEUR HAZY (A) 06/07/2018 0214    LABSPEC 1.019 06/07/2018 0214   PHURINE 6.0 06/07/2018 0214   GLUCOSEU NEGATIVE 06/07/2018 0214   HGBUR LARGE (A) 06/07/2018 0214   BILIRUBINUR NEGATIVE 06/07/2018 0214   BILIRUBINUR neg 04/23/2018 1317   KETONESUR 5 (A) 06/07/2018 0214   PROTEINUR 100 (A) 06/07/2018 0214   UROBILINOGEN 0.2 04/23/2018 1317   NITRITE NEGATIVE 06/07/2018 0214   LEUKOCYTESUR SMALL (A) 06/07/2018 0214    Radiological Exams on Admission: Dg Chest 2 View  Result Date: 06/09/2018 CLINICAL DATA:  22 year old female with shortness of breath. EXAM: CHEST - 2 VIEW COMPARISON:  Chest CT dated 06/05/2018 FINDINGS: There is a small left pleural effusion with left lung base atelectasis or infiltrate. The right lung is clear. There is no pneumothorax. The cardiac silhouette is within normal limits. No acute osseous pathology. IMPRESSION: Small left pleural effusion and left lung base atelectasis/infiltrate. Clinical correlation is recommended. Electronically Signed   By: Anner Crete M.D.   On: 06/09/2018 21:55   Dg Shoulder Left  Result Date: 06/08/2018 CLINICAL DATA:  Left shoulder pain for 1 week, no known injury, initial encounter EXAM: LEFT SHOULDER - 2+ VIEW COMPARISON:  03/18/2018 FINDINGS: No acute fracture or dislocation is noted. Some left basilar atelectasis is noted. This is similar to recent CT examination and may correspond with patient's symptomatology. IMPRESSION: No acute shoulder abnormality is noted. Atelectatic changes in the left base are noted. Electronically Signed   By: Inez Catalina M.D.   On: 06/08/2018 14:32    EKG: Independently reviewed. Vent. rate 122 BPM PR interval * ms QRS duration 77 ms QT/QTc 337/481 ms P-R-T axes 43 49 53 Sinus tachycardia Borderline T abnormalities, lateral leads Borderline prolonged QT interval  Assessment/Plan Principal Problem:   Hypokalemia Observation/telemetry. Continue potassium replacement. Magnesium has been supplemented as well. Follow-up  potassium level later today.  Active Problems:   Lobar pneumonia (HCC)   Continue supplemental oxygen. Continue bronchodilators. Resume Rocephin and IV Zithromax while in the hospital.    Hypomagnesemia Replacing. Follow-up magnesium level as needed.    Mucositis oral   Odynophagia Continue analgesics 6 Magic mouthwash.    Tachycardia This is multifactorial. Correct electrolytes and continue IV hydration. Check echocardiogram.    SLE (  systemic lupus erythematosus) (HCC) Hold oral prednisone while getting IV methylprednisolone. Continue analgesics as needed.    Leukopenia Monitor WBC.    Anemia Monitor hematocrit and hemoglobin.   DVT prophylaxis: Lovenox SQ. Code Status: Full code. Family Communication:  Disposition Plan: Observation for hypokalemia treatment. Resume IV antibiotics. Consults called:  Admission status: Observation/telemetry.   Reubin Milan MD [facility] Pager [phone].  If 7PM-7AM, please contact night-coverage [email or web address] Password [facility]  06/09/2018, 11:55 PM

## 2018-06-09 NOTE — Progress Notes (Signed)
Discharge instructions reviewed with patient and her father. They deny questions and she agrees to followup appts and instructions. She continues to c/o 9-10/10 pain to shoulders L>R and agrees to return to ED if not able to tolerate with at home pain remedies. She left unit via wheelchair to family vehicle.

## 2018-06-09 NOTE — Progress Notes (Signed)
Patient provided with [place]. No other CM needs identified

## 2018-06-10 ENCOUNTER — Other Ambulatory Visit: Payer: Self-pay

## 2018-06-10 ENCOUNTER — Observation Stay (HOSPITAL_BASED_OUTPATIENT_CLINIC_OR_DEPARTMENT_OTHER): Payer: Self-pay

## 2018-06-10 ENCOUNTER — Observation Stay (HOSPITAL_COMMUNITY): Payer: Self-pay

## 2018-06-10 ENCOUNTER — Encounter (HOSPITAL_COMMUNITY): Payer: Self-pay

## 2018-06-10 DIAGNOSIS — M329 Systemic lupus erythematosus, unspecified: Secondary | ICD-10-CM

## 2018-06-10 DIAGNOSIS — D72819 Decreased white blood cell count, unspecified: Secondary | ICD-10-CM

## 2018-06-10 DIAGNOSIS — I340 Nonrheumatic mitral (valve) insufficiency: Secondary | ICD-10-CM

## 2018-06-10 DIAGNOSIS — R000 Tachycardia, unspecified: Secondary | ICD-10-CM

## 2018-06-10 LAB — CBC WITH DIFFERENTIAL/PLATELET
BASOS ABS: 0 10*3/uL (ref 0.0–0.1)
BASOS ABS: 0 10*3/uL (ref 0.0–0.1)
Basophils Relative: 0 %
Basophils Relative: 0 %
EOS ABS: 0 10*3/uL (ref 0.0–0.7)
EOS ABS: 0 10*3/uL (ref 0.0–0.7)
Eosinophils Relative: 0 %
Eosinophils Relative: 0 %
HCT: 28.6 % — ABNORMAL LOW (ref 36.0–46.0)
HCT: 25 % — ABNORMAL LOW (ref 36.0–46.0)
HEMOGLOBIN: 9.3 g/dL — AB (ref 12.0–15.0)
Hemoglobin: 7.6 g/dL — ABNORMAL LOW (ref 12.0–15.0)
LYMPHS PCT: 12 %
Lymphocytes Relative: 12 %
Lymphs Abs: 0.4 10*3/uL — ABNORMAL LOW (ref 0.7–4.0)
Lymphs Abs: 0.3 10*3/uL — ABNORMAL LOW (ref 0.7–4.0)
MCH: 25.7 pg — AB (ref 26.0–34.0)
MCH: 24.1 pg — ABNORMAL LOW (ref 26.0–34.0)
MCHC: 32.5 g/dL (ref 30.0–36.0)
MCHC: 30.4 g/dL (ref 30.0–36.0)
MCV: 79 fL (ref 78.0–100.0)
MCV: 79.4 fL (ref 78.0–100.0)
MONO ABS: 0.1 10*3/uL (ref 0.1–1.0)
Monocytes Absolute: 0.1 10*3/uL (ref 0.1–1.0)
Monocytes Relative: 4 %
Monocytes Relative: 3 %
NEUTROS ABS: 3.1 10*3/uL (ref 1.7–7.7)
NEUTROS PCT: 85 %
Neutro Abs: 2.1 10*3/uL (ref 1.7–7.7)
Neutrophils Relative %: 84 %
PLATELETS: 157 10*3/uL (ref 150–400)
PLATELETS: 145 10*3/uL — AB (ref 150–400)
RBC: 3.62 MIL/uL — ABNORMAL LOW (ref 3.87–5.11)
RBC: 3.15 MIL/uL — ABNORMAL LOW (ref 3.87–5.11)
RDW: 18.6 % — ABNORMAL HIGH (ref 11.5–15.5)
RDW: 18.6 % — AB (ref 11.5–15.5)
WBC: 3.6 10*3/uL — ABNORMAL LOW (ref 4.0–10.5)
WBC: 2.5 10*3/uL — AB (ref 4.0–10.5)

## 2018-06-10 LAB — BLOOD GAS, ARTERIAL
ACID-BASE DEFICIT: 2.7 mmol/L — AB (ref 0.0–2.0)
Bicarbonate: 20.7 mmol/L (ref 20.0–28.0)
DRAWN BY: 129711
FIO2: 21
O2 Saturation: 96.3 %
Patient temperature: 98.6
pCO2 arterial: 30.4 mmHg — ABNORMAL LOW (ref 32.0–48.0)
pH, Arterial: 7.448 (ref 7.350–7.450)
pO2, Arterial: 84.5 mmHg (ref 83.0–108.0)

## 2018-06-10 LAB — BASIC METABOLIC PANEL
ANION GAP: 7 (ref 5–15)
BUN: <5 mg/dL — ABNORMAL LOW (ref 6–20)
CO2: 20 mmol/L — AB (ref 22–32)
Calcium: 8.3 mg/dL — ABNORMAL LOW (ref 8.9–10.3)
Chloride: 112 mmol/L — ABNORMAL HIGH (ref 98–111)
Creatinine, Ser: 0.45 mg/dL (ref 0.44–1.00)
Glucose, Bld: 142 mg/dL — ABNORMAL HIGH (ref 70–99)
POTASSIUM: 3.9 mmol/L (ref 3.5–5.1)
Sodium: 139 mmol/L (ref 135–145)

## 2018-06-10 LAB — CULTURE, BLOOD (ROUTINE X 2)
CULTURE: NO GROWTH
Culture: NO GROWTH
SPECIAL REQUESTS: ADEQUATE
Special Requests: ADEQUATE

## 2018-06-10 LAB — COMPREHENSIVE METABOLIC PANEL
ALT: 34 U/L (ref 0–44)
ANION GAP: 9 (ref 5–15)
AST: 79 U/L — ABNORMAL HIGH (ref 15–41)
Albumin: 2.3 g/dL — ABNORMAL LOW (ref 3.5–5.0)
Alkaline Phosphatase: 63 U/L (ref 38–126)
BUN: <5 mg/dL — ABNORMAL LOW (ref 6–20)
CHLORIDE: 110 mmol/L (ref 98–111)
CO2: 20 mmol/L — ABNORMAL LOW (ref 22–32)
Calcium: 8.1 mg/dL — ABNORMAL LOW (ref 8.9–10.3)
Creatinine, Ser: 0.54 mg/dL (ref 0.44–1.00)
GFR calc Af Amer: >60 mL/min (ref >60)
GFR calc non Af Amer: >60 mL/min (ref >60)
GLUCOSE: 144 mg/dL — AB (ref 70–99)
Potassium: 3.9 mmol/L (ref 3.5–5.1)
Sodium: 139 mmol/L (ref 135–145)
Total Bilirubin: 0.6 mg/dL (ref 0.3–1.2)
Total Protein: 7.1 g/dL (ref 6.5–8.1)

## 2018-06-10 LAB — ECHOCARDIOGRAM COMPLETE
Height: 63 in
WEIGHTICAEL: 2504 [oz_av]

## 2018-06-10 LAB — CK: Total CK: 483 U/L — ABNORMAL HIGH (ref 38–234)

## 2018-06-10 LAB — C DIFFICILE QUICK SCREEN W PCR REFLEX
C Diff antigen: NEGATIVE
C Diff interpretation: NOT DETECTED
C Diff toxin: NEGATIVE

## 2018-06-10 LAB — PHOSPHORUS: Phosphorus: 3.3 mg/dL (ref 2.5–4.6)

## 2018-06-10 LAB — MAGNESIUM
MAGNESIUM: 2.8 mg/dL — AB (ref 1.7–2.4)
Magnesium: 1.4 mg/dL — ABNORMAL LOW (ref 1.7–2.4)

## 2018-06-10 MED ORDER — METHYLPREDNISOLONE SODIUM SUCC 40 MG IJ SOLR
40.0000 mg | Freq: Four times a day (QID) | INTRAMUSCULAR | Status: DC
  Administered 2018-06-10 – 2018-06-11 (×6): 40 mg via INTRAVENOUS
  Filled 2018-06-10 (×6): qty 1

## 2018-06-10 MED ORDER — FOLIC ACID 1 MG PO TABS
2.0000 mg | ORAL_TABLET | Freq: Every day | ORAL | Status: DC
  Administered 2018-06-10 – 2018-06-11 (×2): 2 mg via ORAL
  Filled 2018-06-10 (×2): qty 2

## 2018-06-10 MED ORDER — IPRATROPIUM BROMIDE 0.02 % IN SOLN: 0.5000 mg | Freq: Two times a day (BID) | RESPIRATORY_TRACT | Status: DC

## 2018-06-10 MED ORDER — MAGNESIUM SULFATE 4 GM/100ML IV SOLN
4.0000 g | Freq: Once | INTRAVENOUS | Status: AC
  Administered 2018-06-10: 4 g via INTRAVENOUS
  Filled 2018-06-10: qty 100

## 2018-06-10 MED ORDER — IPRATROPIUM BROMIDE 0.02 % IN SOLN
0.5000 mg | Freq: Four times a day (QID) | RESPIRATORY_TRACT | Status: DC
  Administered 2018-06-10 (×2): 0.5 mg via RESPIRATORY_TRACT
  Filled 2018-06-10 (×3): qty 2.5

## 2018-06-10 MED ORDER — WHITE PETROLATUM EX OINT
TOPICAL_OINTMENT | CUTANEOUS | Status: AC
  Administered 2018-06-10: 0.2
  Filled 2018-06-10: qty 28.35

## 2018-06-10 MED ORDER — LEVALBUTEROL HCL 0.63 MG/3ML IN NEBU: 0.6300 mg | INHALATION_SOLUTION | Freq: Four times a day (QID) | RESPIRATORY_TRACT | Status: DC | PRN

## 2018-06-10 MED ORDER — ONDANSETRON HCL 4 MG/2ML IJ SOLN: 4.0000 mg | Freq: Four times a day (QID) | INTRAMUSCULAR | Status: DC | PRN

## 2018-06-10 MED ORDER — SODIUM CHLORIDE 0.9 % IV SOLN
1.0000 g | INTRAVENOUS | Status: DC
  Administered 2018-06-10 – 2018-06-11 (×2): 1 g via INTRAVENOUS
  Filled 2018-06-10 (×2): qty 10

## 2018-06-10 MED ORDER — AMLODIPINE BESYLATE 5 MG PO TABS
2.5000 mg | ORAL_TABLET | Freq: Every day | ORAL | Status: DC
  Administered 2018-06-10 – 2018-06-11 (×2): 2.5 mg via ORAL
  Filled 2018-06-10 (×2): qty 1

## 2018-06-10 MED ORDER — LEVALBUTEROL HCL 1.25 MG/0.5ML IN NEBU
1.2500 mg | INHALATION_SOLUTION | Freq: Four times a day (QID) | RESPIRATORY_TRACT | Status: DC
  Administered 2018-06-10: 1.25 mg via RESPIRATORY_TRACT
  Filled 2018-06-10 (×2): qty 0.5

## 2018-06-10 MED ORDER — FERROUS SULFATE 325 (65 FE) MG PO TABS
325.0000 mg | ORAL_TABLET | Freq: Two times a day (BID) | ORAL | Status: DC
  Administered 2018-06-10 – 2018-06-11 (×3): 325 mg via ORAL
  Filled 2018-06-10 (×3): qty 1

## 2018-06-10 MED ORDER — MAGIC MOUTHWASH W/LIDOCAINE
15.0000 mL | Freq: Four times a day (QID) | ORAL | Status: DC | PRN
  Administered 2018-06-10: 15 mL via ORAL
  Filled 2018-06-10 (×2): qty 15

## 2018-06-10 MED ORDER — POTASSIUM CHLORIDE 20 MEQ/15ML (10%) PO SOLN
40.0000 meq | Freq: Two times a day (BID) | ORAL | Status: AC
  Administered 2018-06-10 (×2): 40 meq via ORAL
  Filled 2018-06-10 (×2): qty 30

## 2018-06-10 MED ORDER — IOPAMIDOL (ISOVUE-370) INJECTION 76%
INTRAVENOUS | Status: AC
  Administered 2018-06-10: 100 mL
  Filled 2018-06-10: qty 100

## 2018-06-10 MED ORDER — HYDROCODONE-ACETAMINOPHEN 5-325 MG PO TABS
1.0000 | ORAL_TABLET | ORAL | Status: DC | PRN
  Administered 2018-06-10 – 2018-06-11 (×2): 1 via ORAL
  Filled 2018-06-10 (×2): qty 1

## 2018-06-10 MED ORDER — ENOXAPARIN SODIUM 40 MG/0.4ML ~~LOC~~ SOLN
40.0000 mg | SUBCUTANEOUS | Status: DC
  Administered 2018-06-10 – 2018-06-11 (×2): 40 mg via SUBCUTANEOUS
  Filled 2018-06-10 (×2): qty 0.4

## 2018-06-10 MED ORDER — LEVALBUTEROL HCL 1.25 MG/0.5ML IN NEBU
1.2500 mg | INHALATION_SOLUTION | Freq: Two times a day (BID) | RESPIRATORY_TRACT | Status: DC
  Administered 2018-06-10 – 2018-06-11 (×2): 1.25 mg via RESPIRATORY_TRACT
  Filled 2018-06-10 (×2): qty 0.5

## 2018-06-10 MED ORDER — ACETAMINOPHEN 650 MG RE SUPP: 650.0000 mg | Freq: Four times a day (QID) | RECTAL | Status: DC | PRN

## 2018-06-10 MED ORDER — IPRATROPIUM BROMIDE 0.02 % IN SOLN
0.5000 mg | Freq: Two times a day (BID) | RESPIRATORY_TRACT | Status: DC
  Administered 2018-06-10 – 2018-06-11 (×2): 0.5 mg via RESPIRATORY_TRACT
  Filled 2018-06-10 (×2): qty 2.5

## 2018-06-10 MED ORDER — SODIUM CHLORIDE 0.9 % IV SOLN
500.0000 mg | INTRAVENOUS | Status: DC
  Administered 2018-06-10 – 2018-06-11 (×2): 500 mg via INTRAVENOUS
  Filled 2018-06-10 (×2): qty 500

## 2018-06-10 MED ORDER — LEVALBUTEROL HCL 0.63 MG/3ML IN NEBU
0.6300 mg | INHALATION_SOLUTION | Freq: Four times a day (QID) | RESPIRATORY_TRACT | Status: DC | PRN
  Administered 2018-06-10: 0.63 mg via RESPIRATORY_TRACT
  Filled 2018-06-10: qty 3

## 2018-06-10 MED ORDER — ONDANSETRON HCL 4 MG PO TABS: 4.0000 mg | ORAL_TABLET | Freq: Four times a day (QID) | ORAL | Status: DC | PRN

## 2018-06-10 MED ORDER — NYSTATIN 100000 UNIT/ML MT SUSP
5.0000 mL | Freq: Four times a day (QID) | OROMUCOSAL | Status: DC
  Administered 2018-06-10 – 2018-06-11 (×5): 500000 [IU] via ORAL
  Filled 2018-06-10 (×5): qty 5

## 2018-06-10 MED ORDER — KETOROLAC TROMETHAMINE 30 MG/ML IJ SOLN
30.0000 mg | Freq: Once | INTRAMUSCULAR | Status: AC
  Administered 2018-06-10: 30 mg via INTRAVENOUS
  Filled 2018-06-10: qty 1

## 2018-06-10 MED ORDER — ACETAMINOPHEN 325 MG PO TABS: 650.0000 mg | ORAL_TABLET | Freq: Four times a day (QID) | ORAL | Status: DC | PRN

## 2018-06-10 MED ORDER — PANTOPRAZOLE SODIUM 20 MG PO TBEC
20.0000 mg | DELAYED_RELEASE_TABLET | Freq: Every day | ORAL | Status: DC
  Administered 2018-06-10 – 2018-06-11 (×2): 20 mg via ORAL
  Filled 2018-06-10 (×2): qty 1

## 2018-06-10 MED ORDER — VITAMIN D (ERGOCALCIFEROL) 1.25 MG (50000 UNIT) PO CAPS
50000.0000 [IU] | ORAL_CAPSULE | ORAL | Status: DC
  Administered 2018-06-10: 50000 [IU] via ORAL
  Filled 2018-06-10: qty 1

## 2018-06-10 NOTE — Progress Notes (Signed)
  Echocardiogram 2D Echocardiogram has been performed.  Johny Chess 06/10/2018, 12:45 PM

## 2018-06-10 NOTE — Progress Notes (Addendum)
[facility] PROGRESS NOTE    Progress Note  [name] [name]  OEU:[id number] DOB: 28-Jan-1996 DOA: 06/09/2018 PCP: [name], FNP     Brief Narrative:   [name] [name] is an 22 y.o. female medical history of SLE, hypertension iron deficiency anemia lower GI bleed comes into the hospital after being discharged on the day of admission for community-acquired pneumonia.  She comes in complaining of shortness of breath and swelling in her hand and feet.  Assessment/Plan:   Hypokalemia: No events on telemetry not tachycardic.  Continue to replete potassium and magnesium recheck levels this afternoon and tomorrow morning.  Shortness of breath: 2D echo is pending we will check a CT Angie of the chest to rule out a PE and check an ABG. Chest x-ray showed no infiltrate small bilateral pleural effusions.  Lobar pneumonia ([name]): She has remained afebrile satting greater than 90% on room air. Continue IV Rocephin and azithromycin.  Mucositis oral/Odynophagia Continue Magic mouthwash and lidocaine viscous gel.  SLE: Cont. current dose of steroids IV. Check a C3-C4 complement levels.  Mild pancytopenia: No significantly change in the last admission we will repeat a CBC tomorrow morning.  Sinus tachycardia: Has resolved, question due to hypovolemia. Patient is tolerating oral.   DVT prophylaxis: Lovenox Family Communication:dad Disposition Plan/Barrier to D/C: home in 2 3 days Code Status:     Code Status Orders  (From admission, onward)         Start     Ordered   06/10/18 0056  Full code  Continuous     06/10/18 0100        Code Status History    Date Active Date Inactive Code Status Order ID Comments User Context   06/05/2018 1528 06/09/2018 1605 Full Code [id number]  Karmen Bongo, MD ED   01/08/2018 2308 01/13/2018 2009 Full Code [id number]  Etta Quill, DO ED        IV Access:    Peripheral IV   Procedures and diagnostic studies:   Dg Chest 2  View  Result Date: 06/09/2018 CLINICAL DATA:  22 year old female with shortness of breath. EXAM: CHEST - 2 VIEW COMPARISON:  Chest CT dated 06/05/2018 FINDINGS: There is a small left pleural effusion with left lung base atelectasis or infiltrate. The right lung is clear. There is no pneumothorax. The cardiac silhouette is within normal limits. No acute osseous pathology. IMPRESSION: Small left pleural effusion and left lung base atelectasis/infiltrate. Clinical correlation is recommended. Electronically Signed   By: Anner Crete M.D.   On: 06/09/2018 21:55   Dg Shoulder Left  Result Date: 06/08/2018 CLINICAL DATA:  Left shoulder pain for 1 week, no known injury, initial encounter EXAM: LEFT SHOULDER - 2+ VIEW COMPARISON:  03/18/2018 FINDINGS: No acute fracture or dislocation is noted. Some left basilar atelectasis is noted. This is similar to recent CT examination and may correspond with patient's symptomatology. IMPRESSION: No acute shoulder abnormality is noted. Atelectatic changes in the left base are noted. Electronically Signed   By: Inez Catalina M.D.   On: 06/08/2018 14:32     Medical Consultants:    None.  Anti-Infectives:   IV Rocephin and azithromycin.  Subjective:    [name] [name] she relates her breathing is better today, complaining of swelling of her hand and her feet.  Objective:    Vitals:   06/10/18 0000 06/10/18 0100 06/10/18 0143 06/10/18 0735  BP: 132/83 (!) 133/96    Pulse: (!) 107 (!) 108  Resp: 20 20    Temp:  98.4 F (36.9 C)    TempSrc:  Oral    SpO2: 100% 99% 98% 98%  Weight:  71 kg    Height:  5\' 3"  (1.6 m)      Intake/Output Summary (Last 24 hours) at 06/10/2018 0748 Last data filed at 06/10/2018 0324 Gross per 24 hour  Intake 1324.68 ml  Output -  Net 1324.68 ml   Filed Weights   06/09/18 2106 06/10/18 0100  Weight: 68 kg 71 kg    Exam: General exam: In no acute distress. Respiratory system: Good air movement and clear to  auscultation. Cardiovascular system: S1 & S2 heard, RRR. No JVD. Gastrointestinal system: Abdomen is nondistended, soft and nontender.  Central nervous system: Alert and oriented. No focal neurological deficits. Extremities: No pedal edema. Skin: No rashes, lesions or ulcers Psychiatry: Judgement and insight appear normal. Mood & affect appropriate.    Data Reviewed:    Labs: Basic Metabolic Panel: Recent Labs  Lab 06/05/18 1225 06/06/18 0008 06/07/18 0333 06/09/18 2203 06/09/18 2351  NA 135 137 137 137  --   K 3.8 4.1 3.8 2.7*  --   CL 105 110 113* 109  --   CO2 20* 19* 18* 20*  --   GLUCOSE 228* 78 83 111*  --   BUN 12 7 <5* <5*  --   CREATININE 0.84 0.63 0.52 0.51  --   CALCIUM 7.9* 7.6* 7.8* 7.9*  --   MG 1.7  --   --   --  1.4*  PHOS  --   --   --   --  3.3   GFR Estimated Creatinine Clearance: 102.4 mL/min (by C-G formula based on SCr of 0.51 mg/dL). Liver Function Tests: Recent Labs  Lab 06/05/18 1225 06/09/18 2203  AST 72* 80*  ALT 32 32  ALKPHOS 49 57  BILITOT 0.7 0.4  PROT 6.4* 6.4*  ALBUMIN 2.4* 2.4*   No results for input(s): LIPASE, AMYLASE in the last 168 hours. No results for input(s): AMMONIA in the last 168 hours. Coagulation profile Recent Labs  Lab 06/05/18 1225  INR 0.94    CBC: Recent Labs  Lab 06/05/18 1225 06/06/18 0008 06/07/18 0333 06/09/18 2203 06/10/18 0305  WBC 2.8* 4.2 3.8* 3.6* 2.5*  NEUTROABS 2.1  --   --  3.1 2.1  HGB 9.1* 8.7* 8.7* 9.3* 7.6*  HCT 30.2* 27.9* 27.4* 28.6* 25.0*  MCV 80.5 81.3 77.8* 79.0 79.4  PLT 195 175 188 157 145*   Cardiac Enzymes: Recent Labs  Lab 06/05/18 1225 06/05/18 1838  TROPONINI <0.03 <0.03   BNP (last 3 results) No results for input(s): PROBNP in the last 8760 hours. CBG: Recent Labs  Lab 06/05/18 1225 06/05/18 1245 06/05/18 1347 06/05/18 1405 06/06/18 1957  GLUCAP 21* 163* 88 82 88   D-Dimer: No results for input(s): DDIMER in the last 72 hours. Hgb A1c: No results  for input(s): HGBA1C in the last 72 hours. Lipid Profile: No results for input(s): CHOL, HDL, LDLCALC, TRIG, CHOLHDL, LDLDIRECT in the last 72 hours. Thyroid function studies: No results for input(s): TSH, T4TOTAL, T3FREE, THYROIDAB in the last 72 hours.  Invalid input(s): FREET3 Anemia work up: No results for input(s): VITAMINB12, FOLATE, FERRITIN, TIBC, IRON, RETICCTPCT in the last 72 hours. Sepsis Labs: Recent Labs  Lab 06/05/18 1838 06/05/18 2025 06/06/18 0008 06/07/18 0333 06/09/18 2203 06/09/18 2207 06/10/18 0305  PROCALCITON 0.17  --   --   --   --   --   --  WBC  --   --  4.2 3.8* 3.6*  --  2.5*  LATICACIDVEN  --  1.2 0.9  --   --  1.20  --    Microbiology Recent Results (from the past 240 hour(s))  Culture, blood (x 2)     Status: None (Preliminary result)   Collection Time: 06/05/18  8:15 PM  Result Value Ref Range Status   Specimen Description BLOOD LEFT ANTECUBITAL  Final   Special Requests   Final    BOTTLES DRAWN AEROBIC ONLY Blood Culture adequate volume   Culture   Final    NO GROWTH 4 DAYS Performed at [facility], [address], [place], [place] [address]    Report Status PENDING  Incomplete  Culture, blood (x 2)     Status: None (Preliminary result)   Collection Time: 06/05/18  8:23 PM  Result Value Ref Range Status   Specimen Description BLOOD LEFT HAND  Final   Special Requests   Final    BOTTLES DRAWN AEROBIC AND ANAEROBIC Blood Culture adequate volume   Culture   Final    NO GROWTH 4 DAYS Performed at [facility], [place] [address], [place], [place] [address]    Report Status PENDING  Incomplete  Culture, Urine     Status: Abnormal   Collection Time: 06/06/18  7:59 PM  Result Value Ref Range Status   Specimen Description URINE, RANDOM  Final   Special Requests NONE  Final   Culture (A)  Final    <10,000 COLONIES/mL INSIGNIFICANT GROWTH Performed at [facility], [place] [address] [place], [place] [address]    Report  Status 06/08/2018 FINAL  Final  C difficile quick scan w PCR reflex     Status: None   Collection Time: 06/10/18  3:41 AM  Result Value Ref Range Status   C Diff antigen NEGATIVE NEGATIVE Final   C Diff toxin NEGATIVE NEGATIVE Final   C Diff interpretation No C. difficile detected.  Final    Comment: Performed at [facility], [place] [address], [place], [place] [address]     Medications:   . amLODipine  2.5 mg Oral Daily  . enoxaparin (LOVENOX) injection  40 mg Subcutaneous Q24H  . ferrous sulfate  325 mg Oral BID WC  . folic acid  2 mg Oral Daily  . ipratropium  0.5 mg Nebulization Q6H  . levalbuterol  1.25 mg Nebulization Q6H  . methylPREDNISolone (SOLU-MEDROL) injection  40 mg Intravenous Q6H  . nystatin  5 mL Oral QID  . pantoprazole  20 mg Oral Daily  . potassium chloride  40 mEq Oral BID  . Vitamin D (Ergocalciferol)  50,000 Units Oral Q Sun   Continuous Infusions: . 0.9 % NaCl with KCl 20 mEq / L 125 mL/hr at 06/10/18 0324  . azithromycin    . cefTRIAXone (ROCEPHIN)  IV    . magnesium sulfate 1 - 4 g bolus IVPB       LOS: 0 days   Charlynne Cousins  [facility] Pager [phone]  *Please refer to [email or web address], password [email or web address] to get updated schedule on who will round on this patient, as hospitalists switch teams weekly. If 7PM-7AM, please contact night-coverage at [email or web address], password [email or web address] for any overnight needs.  06/10/2018, 7:48 AM

## 2018-06-11 DIAGNOSIS — J181 Lobar pneumonia, unspecified organism: Secondary | ICD-10-CM

## 2018-06-11 DIAGNOSIS — J189 Pneumonia, unspecified organism: Secondary | ICD-10-CM

## 2018-06-11 DIAGNOSIS — E876 Hypokalemia: Secondary | ICD-10-CM

## 2018-06-11 DIAGNOSIS — K1230 Oral mucositis (ulcerative), unspecified: Secondary | ICD-10-CM

## 2018-06-11 DIAGNOSIS — E2740 Unspecified adrenocortical insufficiency: Secondary | ICD-10-CM

## 2018-06-11 DIAGNOSIS — E162 Hypoglycemia, unspecified: Secondary | ICD-10-CM

## 2018-06-11 HISTORY — DX: Unspecified adrenocortical insufficiency: E27.40

## 2018-06-11 LAB — CBC WITH DIFFERENTIAL/PLATELET
BASOS ABS: 0 10*3/uL (ref 0.0–0.1)
Basophils Relative: 0 %
Eosinophils Absolute: 0 10*3/uL (ref 0.0–0.7)
Eosinophils Relative: 0 %
HEMATOCRIT: 27.8 % — AB (ref 36.0–46.0)
Hemoglobin: 8.8 g/dL — ABNORMAL LOW (ref 12.0–15.0)
Lymphocytes Relative: 14 %
Lymphs Abs: 0.6 10*3/uL — ABNORMAL LOW (ref 0.7–4.0)
MCH: 24.9 pg — AB (ref 26.0–34.0)
MCHC: 31.7 g/dL (ref 30.0–36.0)
MCV: 78.5 fL (ref 78.0–100.0)
MONO ABS: 0.4 10*3/uL (ref 0.1–1.0)
Monocytes Relative: 10 %
NEUTROS ABS: 3.2 10*3/uL (ref 1.7–7.7)
Neutrophils Relative %: 76 %
Platelets: 224 10*3/uL (ref 150–400)
RBC: 3.54 MIL/uL — AB (ref 3.87–5.11)
RDW: 19 % — AB (ref 11.5–15.5)
WBC: 4.2 10*3/uL (ref 4.0–10.5)

## 2018-06-11 LAB — BASIC METABOLIC PANEL
Anion gap: 5 (ref 5–15)
BUN: 5 mg/dL — AB (ref 6–20)
CALCIUM: 8 mg/dL — AB (ref 8.9–10.3)
CHLORIDE: 114 mmol/L — AB (ref 98–111)
CO2: 22 mmol/L (ref 22–32)
CREATININE: 0.47 mg/dL (ref 0.44–1.00)
GFR calc non Af Amer: >60 mL/min (ref >60)
Glucose, Bld: 122 mg/dL — ABNORMAL HIGH (ref 70–99)
Potassium: 4.5 mmol/L (ref 3.5–5.1)
Sodium: 141 mmol/L (ref 135–145)

## 2018-06-11 LAB — C3 COMPLEMENT: C3 Complement: 52 mg/dL — ABNORMAL LOW (ref 82–167)

## 2018-06-11 LAB — C4 COMPLEMENT: COMPLEMENT C4, BODY FLUID: 10 mg/dL — AB (ref 14–44)

## 2018-06-11 LAB — MAGNESIUM: Magnesium: 2.4 mg/dL (ref 1.7–2.4)

## 2018-06-11 MED ORDER — PREDNISONE 5 MG PO TABS: 20.0000 mg | ORAL_TABLET | Freq: Every day | ORAL | 0 refills | Status: DC

## 2018-06-11 MED ORDER — SODIUM CHLORIDE 0.9 % IV SOLN
INTRAVENOUS | Status: DC
  Administered 2018-06-11: 10:00:00 via INTRAVENOUS

## 2018-06-11 NOTE — Discharge Summary (Signed)
Physician Discharge Summary  [name] [name] EHU:[id number] DOB: 1996/01/16 DOA: 06/09/2018  PCP: [name], FNP  Admit date: 06/09/2018 Discharge date: 06/11/2018  Admitted From: home Disposition:  Home  Recommendations for Outpatient Follow-up:  1. Follow up with Rheumatologist this week. 2. Please obtain BMP/CBC in one week   Home Health:No Equipment/Devices:none  Discharge Condition:stable CODE STATUS:full Diet recommendation: Heart Healthy   Brief/Interim Summary: 22 y.o. female medical history of SLE, hypertension iron deficiency anemia lower GI bleed comes into the hospital after being discharged on the day of admission for community-acquired pneumonia.  She comes in complaining of shortness of breath and swelling in her hand and feet.  Discharge Diagnoses:  Principal Problem:   Hypokalemia Active Problems:   SLE (systemic lupus erythematosus) (HCC)   Leukopenia   Mucositis oral   Odynophagia   Tachycardia   Lobar pneumonia (HCC)   Anemia   Hypomagnesemia   Relative acute adrenal insufficiency (HCC)  Relative acute adrenal insufficiency: When she was discharged from the hospital she was discharged on prednisone 10 she became slightly confused and generalized weakness with nausea and vomiting on admission to the hospital she was placed back on IV Solu-Medrol and her symptoms resolved. She will go home on prednisone 20. She has a follow-up with rheumatology on June 15, 2018. It is to note that she has been on steroids 10 mg for the last 5 years.  Hypokalemia: Likely due to above repleted orally now resolved.  Shortness of breath: 2D echo showed no acute findings CT Angie of the chest was negative for PE, chest x-ray shows no acute infiltrates.  Lobar pneumonia: No changes made to her antibiotic to continue her oral Levaquin as an outpatient.  Mucositis odynophagia: Now resolved she was placed on Magic mouthwash and lidocaine gel which she has tolerated  well.  SLE: Continue current dose of prednisone 20 mg to follow-up with rheumatologist will taper off her steroids slowly and hopefully get her into an immunomodulator agent.  Mild pancytopenia: Now resolved.  Sinus tachycardia: May be due to hypovolemia and probable relative adrenal insufficiency now resolved.  Discharge Instructions  Discharge Instructions    Diet - low sodium heart healthy   Complete by:  As directed    Increase activity slowly   Complete by:  As directed      Allergies as of 06/11/2018      Reactions   Hydroxychloroquine Rash   Latex Rash      Medication List    TAKE these medications   AMBULATORY NON FORMULARY MEDICATION Medication Name: GI Cocktail 90 ml- viscvous lidocaine 2 % 90 ml Dicyclomine 10 / 5 ml 270 Maalox Total ml's is 450 ml's Take 30 cc's by mouth every 4-6 hours.   amLODipine 2.5 MG tablet Commonly known as:  NORVASC Take 2.5 mg by mouth daily.   ferrous sulfate 325 (65 FE) MG tablet Take 1 tablet (325 mg total) by mouth 2 (two) times daily. With 1/2 glass of orange juice   folic acid 1 MG tablet Commonly known as:  FOLVITE TAKE 2 TABLETS (2 MG TOTAL) BY MOUTH DAILY.   HYDROcodone-acetaminophen 5-325 MG tablet Commonly known as:  NORCO/VICODIN Take 1-2 tablets by mouth every 6 (six) hours as needed.   levofloxacin 500 MG tablet Commonly known as:  LEVAQUIN Take 1 tablet (500 mg total) by mouth daily for 10 days.   lidocaine 2 % solution Commonly known as:  XYLOCAINE Use as directed 15 mLs in the mouth or  throat every 4 (four) hours as needed for mouth pain.   medroxyPROGESTERone 150 MG/ML injection Commonly known as:  DEPO-PROVERA Inject 150 mg into the muscle every 3 (three) months.   nystatin 100000 UNIT/ML suspension Commonly known as:  MYCOSTATIN Take 5 mLs by mouth 4 (four) times daily.   pantoprazole 20 MG tablet Commonly known as:  PROTONIX Take 1 tablet (20 mg total) by mouth daily.   predniSONE 5 MG  tablet Commonly known as:  DELTASONE Take 4 tablets (20 mg total) by mouth daily. What changed:  how much to take   Vitamin D (Ergocalciferol) 50000 units Caps capsule Commonly known as:  DRISDOL Take 1 capsule (50,000 Units total) by mouth 2 (two) times a week. What changed:  when to take this       Allergies  Allergen Reactions  . Hydroxychloroquine Rash  . Latex Rash    Consultations:  None   Procedures/Studies: Dg Chest 2 View  Result Date: 06/09/2018 CLINICAL DATA:  22 year old female with shortness of breath. EXAM: CHEST - 2 VIEW COMPARISON:  Chest CT dated 06/05/2018 FINDINGS: There is a small left pleural effusion with left lung base atelectasis or infiltrate. The right lung is clear. There is no pneumothorax. The cardiac silhouette is within normal limits. No acute osseous pathology. IMPRESSION: Small left pleural effusion and left lung base atelectasis/infiltrate. Clinical correlation is recommended. Electronically Signed   By: Anner Crete M.D.   On: 06/09/2018 21:55   Dg Chest 2 View  Result Date: 05/30/2018 CLINICAL DATA:  Bilateral shoulder and back pain for 3 days. No known injury. Shortness of breath. EXAM: CHEST - 2 VIEW COMPARISON:  10/01/2017 and 05/11/2016. FINDINGS: The heart size and mediastinal contours are normal. The lungs are clear. There is no pleural effusion or pneumothorax. No acute osseous findings are identified. IMPRESSION: No active cardiopulmonary process. Electronically Signed   By: Richardean Sale M.D.   On: 05/30/2018 14:30   Ct Angio Chest Pe W Or Wo Contrast  Result Date: 06/10/2018 CLINICAL DATA:  Chest pain, shortness of breath EXAM: CT ANGIOGRAPHY CHEST WITH CONTRAST TECHNIQUE: Multidetector CT imaging of the chest was performed using the standard protocol during bolus administration of intravenous contrast. Multiplanar CT image reconstructions and MIPs were obtained to evaluate the vascular anatomy. CONTRAST:  160mL ISOVUE-370  IOPAMIDOL (ISOVUE-370) INJECTION 76% COMPARISON:  None. FINDINGS: Cardiovascular: Satisfactory opacification of the pulmonary arteries to the segmental level. No evidence of pulmonary embolism. Normal heart size. Mild cardiomegaly. Thoracic aorta is normal in caliber. No thoracic aortic aneurysm or dissection. Mediastinum/Nodes: No enlarged mediastinal, hilar, or axillary lymph nodes. Thyroid gland, trachea, and esophagus demonstrate no significant findings. Lungs/Pleura: Small bilateral pleural effusions, left greater than right. Mild bilateral interstitial prominence. No focal consolidation or pneumothorax. Upper Abdomen: No acute abnormality. Musculoskeletal: No acute osseous abnormality. No aggressive osseous lesion. Review of the MIP images confirms the above findings. IMPRESSION: 1. No evidence pulmonary embolus. 2. Mild cardiomegaly, small bilateral pleural effusions and bilateral mild interstitial thickening. Overall findings are concerning for pulmonary edema. Electronically Signed   By: Kathreen Devoid   On: 06/10/2018 09:54   Ct Angio Chest Pe W Or Wo Contrast  Result Date: 06/05/2018 CLINICAL DATA:  Chest pain and shortness of breath. EXAM: CT ANGIOGRAPHY CHEST WITH CONTRAST TECHNIQUE: Multidetector CT imaging of the chest was performed using the standard protocol during bolus administration of intravenous contrast. Multiplanar CT image reconstructions and MIPs were obtained to evaluate the vascular anatomy. CONTRAST:  124mL  ISOVUE-370 IOPAMIDOL (ISOVUE-370) INJECTION 76% COMPARISON:  CTA chest abdomen pelvis 05/30/2018 FINDINGS: Cardiovascular: --Pulmonary arteries: Contrast injection is sufficient to demonstrate satisfactory opacification of the pulmonary arteries to the segmental level. There is no pulmonary embolus. The main pulmonary artery is within normal limits for size. --Aorta: Satisfactory opacification of the thoracic aorta. No aortic dissection or other acute aortic syndrome. Normal variant  aortic arch branching pattern with the left vertebral artery arising independently from the aortic arch. The aortic course and caliber are normal. There is no aortic atherosclerosis. --Heart: Mild cardiomegaly.  No pericardial effusion. Mediastinum/Nodes: No mediastinal, hilar or axillary lymphadenopathy. The visualized thyroid and thoracic esophageal course are unremarkable. Lungs/Pleura: Left basilar opacities are new compared to 05/30/2018. No pleural effusion or pneumothorax. Upper Abdomen: Contrast bolus timing is not optimized for evaluation of the abdominal organs. Within this limitation, the visualized organs of the upper abdomen are normal. Musculoskeletal: No chest wall abnormality. No acute or significant osseous findings. Review of the MIP images confirms the above findings. IMPRESSION: 1. No pulmonary embolus or acute aortic syndrome. 2. New opacities in the left lower lobe compared to 05/30/2018, which may indicate atelectasis or pneumonia. Electronically Signed   By: Ulyses Jarred M.D.   On: 06/05/2018 17:15   Dg Chest Portable 1 View  Result Date: 06/05/2018 CLINICAL DATA:  Short of breath, left chest pain, some right arm numbness today, history of lupus EXAM: PORTABLE CHEST 1 VIEW COMPARISON:  CT chest of 05/30/2017 and chest x-ray of the same day FINDINGS: There is parenchymal opacity medially at the left lung base with some elevation of the left hemidiaphragm. This opacity may be due to atelectasis, but developing known pneumonia cannot be excluded. Follow-up is recommended if clinically warranted. The right lung appears clear. The heart is within upper limits of normal. No bony abnormality is seen. IMPRESSION: Prominent markings medially at the left lung base may be due to atelectasis, but developing pneumonia cannot be excluded. Consider follow-up if warranted clinically. Electronically Signed   By: Ivar Drape M.D.   On: 06/05/2018 11:52   Dg Shoulder Left  Result Date:  06/08/2018 CLINICAL DATA:  Left shoulder pain for 1 week, no known injury, initial encounter EXAM: LEFT SHOULDER - 2+ VIEW COMPARISON:  03/18/2018 FINDINGS: No acute fracture or dislocation is noted. Some left basilar atelectasis is noted. This is similar to recent CT examination and may correspond with patient's symptomatology. IMPRESSION: No acute shoulder abnormality is noted. Atelectatic changes in the left base are noted. Electronically Signed   By: Inez Catalina M.D.   On: 06/08/2018 14:32   Ct Angio Chest/abd/pel For Dissection W And/or Wo Contrast  Result Date: 05/30/2018 CLINICAL DATA:  Back pain and bilateral shoulder pain. Shortness of breath. History of lupus. EXAM: CT ANGIOGRAPHY CHEST, ABDOMEN AND PELVIS TECHNIQUE: Multidetector CT imaging through the chest, abdomen and pelvis was performed using the standard protocol during bolus administration of intravenous contrast. Multiplanar reconstructed images and MIPs were obtained and reviewed to evaluate the vascular anatomy. CONTRAST:  116mL ISOVUE-370 IOPAMIDOL (ISOVUE-370) INJECTION 76% COMPARISON:  None. FINDINGS: CTA CHEST FINDINGS Cardiovascular: The thoracic aorta and pulmonary arteries are well opacified. The thoracic aorta is of normal caliber. There is no evidence of dissection or vasculitis. No significant atherosclerosis. Incidental normal variant separate origin of the left vertebral artery off of the aortic arch. No evidence of pulmonary embolism. Central pulmonary arteries are normal in caliber. The heart size is within normal limits. No pericardial fluid. Mediastinum/Nodes: No  enlarged mediastinal, hilar, or axillary lymph nodes. Thyroid gland, trachea, and esophagus demonstrate no significant findings. Lungs/Pleura: There is no evidence of pulmonary edema, consolidation, pneumothorax, nodule or pleural fluid. Musculoskeletal: No chest wall abnormality. No acute or significant osseous findings. Review of the MIP images confirms the above  findings. CTA ABDOMEN AND PELVIS FINDINGS VASCULAR Aorta: Normal caliber abdominal aorta. No evidence of dissection or vasculitis. Celiac: Normally patent. SMA: Normally patent.  Normal variant replaced right hepatic artery. Renals: Single left renal artery and 2 separate right renal arteries show normal patency. IMA: Normally patent. Inflow: Normally patent bilateral common, external and internal iliac arteries. Normally patent bilateral common femoral arteries and femoral bifurcations. Review of the MIP images confirms the above findings. NON-VASCULAR Hepatobiliary: The liver demonstrates diffuse steatosis. Gallbladder and bile ducts appear unremarkable. Pancreas: Unremarkable. No pancreatic ductal dilatation or surrounding inflammatory changes. Spleen: Normal in size without focal abnormality. Adrenals/Urinary Tract: Adrenal glands are unremarkable. Kidneys are normal, without renal calculi, focal lesion, or hydronephrosis. Bladder is unremarkable. Stomach/Bowel: Bowel shows no evidence of obstruction or inflammation. No free air identified. Lymphatic: No enlarged lymph nodes identified. Reproductive: Uterus and bilateral adnexa are unremarkable. Other: No abdominal wall hernia or abnormality. No abdominopelvic ascites. Musculoskeletal: No acute or significant osseous findings. Review of the MIP images confirms the above findings. IMPRESSION: 1. Normal CTA of the chest, abdomen and pelvis. No acute aortic pathology. 2. No evidence of pulmonary embolism. 3. Diffuse hepatic steatosis. Electronically Signed   By: Aletta Edouard M.D.   On: 05/30/2018 18:07    2D echo that showed a mild elevated pulmonary pressures, ejection fraction of 50% no wall motion abnormalities or diastolic dysfunction.   Subjective: Patient feels great back to baseline would like to go home.  Discharge Exam: Vitals:   06/11/18 0428 06/11/18 0910  BP: 110/79   Pulse:  89  Resp: 16 (!) 22  Temp: 98.2 F (36.8 C)   SpO2: 98%  99%   Vitals:   06/10/18 2127 06/10/18 2355 06/11/18 0428 06/11/18 0910  BP:  123/81 110/79   Pulse:  (!) 101  89  Resp:  13 16 (!) 22  Temp:  98.2 F (36.8 C) 98.2 F (36.8 C)   TempSrc:  Oral Oral   SpO2: 98% 98% 98% 99%  Weight:      Height:        General: Pt is alert, awake, not in acute distress Cardiovascular: RRR, S1/S2 +, no rubs, no gallops Respiratory: CTA bilaterally, no wheezing, no rhonchi Abdominal: Soft, NT, ND, bowel sounds + Extremities: no edema, no cyanosis    The results of significant diagnostics from this hospitalization (including imaging, microbiology, ancillary and laboratory) are listed below for reference.     Microbiology: Recent Results (from the past 240 hour(s))  Culture, blood (x 2)     Status: None   Collection Time: 06/05/18  8:15 PM  Result Value Ref Range Status   Specimen Description BLOOD LEFT ANTECUBITAL  Final   Special Requests   Final    BOTTLES DRAWN AEROBIC ONLY Blood Culture adequate volume   Culture   Final    NO GROWTH 5 DAYS Performed at [facility], [address], [place], [place] [address]    Report Status 06/10/2018 FINAL  Final  Culture, blood (x 2)     Status: None   Collection Time: 06/05/18  8:23 PM  Result Value Ref Range Status   Specimen Description BLOOD LEFT HAND  Final  Special Requests   Final    BOTTLES DRAWN AEROBIC AND ANAEROBIC Blood Culture adequate volume   Culture   Final    NO GROWTH 5 DAYS Performed at [facility], [place] [address], [place], [place] [address]    Report Status 06/10/2018 FINAL  Final  Culture, Urine     Status: Abnormal   Collection Time: 06/06/18  7:59 PM  Result Value Ref Range Status   Specimen Description URINE, RANDOM  Final   Special Requests NONE  Final   Culture (A)  Final    <10,000 COLONIES/mL INSIGNIFICANT GROWTH Performed at [facility], [place] [address] [place],  [address]    Report Status 06/08/2018 FINAL  Final  C difficile  quick scan w PCR reflex     Status: None   Collection Time: 06/10/18  3:41 AM  Result Value Ref Range Status   C Diff antigen NEGATIVE NEGATIVE Final   C Diff toxin NEGATIVE NEGATIVE Final   C Diff interpretation No C. difficile detected.  Final    Comment: Performed at [facility], [place] [address] [place],  [address]     Labs: BNP (last 3 results) No results for input(s): BNP in the last 8760 hours. Basic Metabolic Panel: Recent Labs  Lab 06/05/18 1225  06/07/18 0333 06/09/18 2203 06/09/18 2351 06/10/18 0721 06/10/18 0900 06/10/18 1341 06/11/18 0243  NA 135   < > 137 137  --  139  --  139 141  K 3.8   < > 3.8 2.7*  --  3.9  --  3.9 4.5  CL 105   < > 113* 109  --  110  --  112* 114*  CO2 20*   < > 18* 20*  --  20*  --  20* 22  GLUCOSE 228*   < > 83 111*  --  144*  --  142* 122*  BUN 12   < > <5* <5*  --  <5*  --  <5* 5*  CREATININE 0.84   < > 0.52 0.51  --  0.54  --  0.45 0.47  CALCIUM 7.9*   < > 7.8* 7.9*  --  8.1*  --  8.3* 8.0*  MG 1.7  --   --   --  1.4*  --  2.8*  --  2.4  PHOS  --   --   --   --  3.3  --   --   --   --    < > = values in this interval not displayed.   Liver Function Tests: Recent Labs  Lab 06/05/18 1225 06/09/18 2203 06/10/18 0721  AST 72* 80* 79*  ALT 32 32 34  ALKPHOS 49 57 63  BILITOT 0.7 0.4 0.6  PROT 6.4* 6.4* 7.1  ALBUMIN 2.4* 2.4* 2.3*   No results for input(s): LIPASE, AMYLASE in the last 168 hours. No results for input(s): AMMONIA in the last 168 hours. CBC: Recent Labs  Lab 06/05/18 1225 06/06/18 0008 06/07/18 0333 06/09/18 2203 06/10/18 0305 06/11/18 0243  WBC 2.8* 4.2 3.8* 3.6* 2.5* 4.2  NEUTROABS 2.1  --   --  3.1 2.1 3.2  HGB 9.1* 8.7* 8.7* 9.3* 7.6* 8.8*  HCT 30.2* 27.9* 27.4* 28.6* 25.0* 27.8*  MCV 80.5 81.3 77.8* 79.0 79.4 78.5  PLT 195 175 188 157 145* 224   Cardiac Enzymes: Recent Labs  Lab 06/05/18 1225 06/05/18 1838 06/10/18 0900  CKTOTAL  --   --  483*  TROPONINI <0.03 <0.03  --     BNP: Invalid input(s): POCBNP CBG: Recent Labs  Lab 06/05/18 1225 06/05/18 1245 06/05/18 1347 06/05/18 1405 06/06/18 1957  GLUCAP 21* 163* 88 82 88   D-Dimer No results for input(s): DDIMER in the last 72 hours. Hgb A1c No results for input(s): HGBA1C in the last 72 hours. Lipid Profile No results for input(s): CHOL, HDL, LDLCALC, TRIG, CHOLHDL, LDLDIRECT in the last 72 hours. Thyroid function studies No results for input(s): TSH, T4TOTAL, T3FREE, THYROIDAB in the last 72 hours.  Invalid input(s): FREET3 Anemia work up No results for input(s): VITAMINB12, FOLATE, FERRITIN, TIBC, IRON, RETICCTPCT in the last 72 hours. Urinalysis    Component Value Date/Time   COLORURINE YELLOW 06/07/2018 0214   APPEARANCEUR HAZY (A) 06/07/2018 0214   LABSPEC 1.019 06/07/2018 0214   PHURINE 6.0 06/07/2018 0214   GLUCOSEU NEGATIVE 06/07/2018 0214   HGBUR LARGE (A) 06/07/2018 0214   BILIRUBINUR NEGATIVE 06/07/2018 0214   BILIRUBINUR neg 04/23/2018 1317   KETONESUR 5 (A) 06/07/2018 0214   PROTEINUR 100 (A) 06/07/2018 0214   UROBILINOGEN 0.2 04/23/2018 1317   NITRITE NEGATIVE 06/07/2018 0214   LEUKOCYTESUR SMALL (A) 06/07/2018 0214   Sepsis Labs Invalid input(s): PROCALCITONIN,  WBC,  LACTICIDVEN Microbiology Recent Results (from the past 240 hour(s))  Culture, blood (x 2)     Status: None   Collection Time: 06/05/18  8:15 PM  Result Value Ref Range Status   Specimen Description BLOOD LEFT ANTECUBITAL  Final   Special Requests   Final    BOTTLES DRAWN AEROBIC ONLY Blood Culture adequate volume   Culture   Final    NO GROWTH 5 DAYS Performed at [facility], [address], [place], [place] [address]    Report Status 06/10/2018 FINAL  Final  Culture, blood (x 2)     Status: None   Collection Time: 06/05/18  8:23 PM  Result Value Ref Range Status   Specimen Description BLOOD LEFT HAND  Final   Special Requests   Final    BOTTLES DRAWN AEROBIC AND ANAEROBIC Blood Culture  adequate volume   Culture   Final    NO GROWTH 5 DAYS Performed at [facility], [place] [address], [place], [place] [address]    Report Status 06/10/2018 FINAL  Final  Culture, Urine     Status: Abnormal   Collection Time: 06/06/18  7:59 PM  Result Value Ref Range Status   Specimen Description URINE, RANDOM  Final   Special Requests NONE  Final   Culture (A)  Final    <10,000 COLONIES/mL INSIGNIFICANT GROWTH Performed at [facility], [place] [address], [place], [place] [address]    Report Status 06/08/2018 FINAL  Final  C difficile quick scan w PCR reflex     Status: None   Collection Time: 06/10/18  3:41 AM  Result Value Ref Range Status   C Diff antigen NEGATIVE NEGATIVE Final   C Diff toxin NEGATIVE NEGATIVE Final   C Diff interpretation No C. difficile detected.  Final    Comment: Performed at [facility], [place] [address] [place], [place] [address]     Time coordinating discharge: Over 30 minutes  SIGNED:   Charlynne Cousins, MD  [facility] 06/11/2018, 11:06 AM Pager   If 7PM-7AM, please contact night-coverage [email or web address] Password [email or web address]

## 2018-06-11 NOTE — Discharge Instructions (Signed)
[name] [name] was admitted to the Hospital on 06/09/2018 and Discharged on Discharge Date 06/11/2018 and should be excused from work/school   for 5   days starting 06/09/2018 , may return to work/school without any restrictions.  Call Bess Harvest MD, [facility] [phone] with questions.  Charlynne Cousins M.D on 06/11/2018,at 11:36 AM  [facility] Office  [phone]

## 2018-06-11 NOTE — Progress Notes (Signed)
Order received to discharge patient.  Telemetry monitor removed and CCMD notified.  PIV access removed.  Discharge instructions, follow up, medications and instructions for their use discussed with patient. 

## 2018-06-13 ENCOUNTER — Encounter: Payer: Self-pay | Admitting: Internal Medicine

## 2018-06-13 ENCOUNTER — Ambulatory Visit (INDEPENDENT_AMBULATORY_CARE_PROVIDER_SITE_OTHER): Payer: Medicaid Other | Admitting: Internal Medicine

## 2018-06-13 VITALS — BP 118/83 | HR 132 | Temp 98.5°F | Ht 63.0 in | Wt 155.0 lb

## 2018-06-13 DIAGNOSIS — K121 Other forms of stomatitis: Secondary | ICD-10-CM

## 2018-06-13 NOTE — Progress Notes (Signed)
[name]  Reason for Consult: Mouth ulcers Referring Provider: [name], FNP  Assessment: I suspect that her recent mouth ulcers are due to her lupus.  It sounds like her lupus symptoms have gotten worse recently.  This is likely partially due to the fact that she has been attempting to taper off of prednisone.  It is also possible that her braces could be having some effect as well but this should not cause ulcers on her hard palate.  I do not think herpes or other infections are nearly as likely.  Plan: 1. Follow-up with her rheumatologist in 2 days  Patient Active Problem List   Diagnosis Date Noted  . Mouth ulcers     Priority: High  . SLE (systemic lupus erythematosus) ([place]) 09/19/2016    Priority: High  . Methotrexate toxicity     Priority: Medium  . Accidental methotrexate overdose 01/08/2018    Priority: Medium  . Relative acute adrenal insufficiency ([place]) 06/11/2018  . Anemia 06/09/2018  . Lobar pneumonia ([place]) 06/07/2018  . Pleuritic chest pain 06/05/2018  . Dysphagia 06/01/2018  . Odynophagia 06/01/2018  . Thrush 06/01/2018  . Mucositis oral 01/12/2018  . Vaginal candidiasis 01/12/2018  . Leukopenia 01/10/2018  . Depression 02/08/2017  . Primary osteoarthritis of both knees 11/09/2016  . Chronic midline low back pain without sciatica 11/09/2016  . Vitamin D deficiency 11/09/2016  . Insomnia 01/29/2016  . Depo-Provera contraceptive status 08/11/2015  . Raynaud's disease 08/11/2015  . Seasonal allergies 08/11/2015  . IDA (iron deficiency anemia) 08/11/2015    Patient's Medications  New Prescriptions   No medications on file  Previous Medications   AMBULATORY NON FORMULARY MEDICATION    Medication Name: GI Cocktail 90 ml- viscvous lidocaine 2 % 90 ml Dicyclomine 10 / 5 ml 270 Maalox Total ml's is 450 ml's Take 30 cc's by mouth every 4-6 hours.   AMLODIPINE (NORVASC) 2.5 MG TABLET    Take 2.5 mg by mouth  daily.   FERROUS SULFATE 325 (65 FE) MG TABLET    Take 1 tablet (325 mg total) by mouth 2 (two) times daily. With 1/2 glass of orange juice   FOLIC ACID (FOLVITE) 1 MG TABLET    TAKE 2 TABLETS (2 MG TOTAL) BY MOUTH DAILY.   HYDROCODONE-ACETAMINOPHEN (NORCO/VICODIN) 5-325 MG TABLET    Take 1-2 tablets by mouth every 6 (six) hours as needed.   LEVOFLOXACIN (LEVAQUIN) 500 MG TABLET    Take 1 tablet (500 mg total) by mouth daily for 10 days.   LIDOCAINE (XYLOCAINE) 2 % SOLUTION    Use as directed 15 mLs in the mouth or throat every 4 (four) hours as needed for mouth pain.   MEDROXYPROGESTERONE (DEPO-PROVERA) 150 MG/ML INJECTION    Inject 150 mg into the muscle every 3 (three) months.   NYSTATIN (MYCOSTATIN) 100000 UNIT/ML SUSPENSION    Take 5 mLs by mouth 4 (four) times daily.   PANTOPRAZOLE (PROTONIX) 20 MG TABLET    Take 1 tablet (20 mg total) by mouth daily.   PREDNISONE (DELTASONE) 5 MG TABLET    Take 4 tablets (20 mg total) by mouth daily.   VITAMIN D, ERGOCALCIFEROL, (DRISDOL) 50000 UNITS CAPS CAPSULE    Take 1 capsule (50,000 Units total) by mouth 2 (two) times a week.  Modified Medications   No medications on file  Discontinued Medications   No medications on file    HPI: [name] [name] is  a 22 y.o. female who is referred for evaluation of mouth ulcers.  She says that they have been present for about 2 months.  She has had ulcers in different locations ever since that time.  She was diagnosed with lupus in 2013.  She has been on prednisone ever since.  She was also treated with methotrexate but accidentally overdosed last year.  She had acute, transient mouth ulcers at that time that resolved once methotrexate was stopped.  She saw her rheumatologist in late July and they discussed possibly changing to Plaquenil.  She has a follow-up visit with him in 2 days.  Because of concerns about potential side effects of long-term prednisone she has been decreasing the dose of her prednisone over the past  2 months.  She was recently hospitalized with community-acquired pneumonia.  I note that her C3 and C4 were both low at that time.  She underwent EGD on 06/05/2018.  3 shallow ulcers were noted on her hard palate.  No acute abnormalities were noted in the esophagus, stomach or duodenum.  Review of Systems: Review of Systems  Constitutional: Positive for malaise/fatigue. Negative for chills, diaphoresis, fever and weight loss.  HENT: Negative for congestion and sore throat.   Respiratory: Negative for cough, sputum production and shortness of breath.   Cardiovascular: Positive for chest pain.       She often notes some tightness in her left anterior chest when she first wakes up in the morning.  It is not pleuritic.  Gastrointestinal: Negative for abdominal pain, diarrhea, nausea and vomiting.  Genitourinary: Negative for dysuria.  Musculoskeletal: Positive for joint pain.       She has recently worsening, diffuse joint pain and stiffness.  It is difficult for her to pick up things with her hands and she has difficulty getting out of bed by herself in the morning.  Skin: Positive for rash.  Neurological: Positive for tremors. Negative for headaches.      Past Medical History:  Diagnosis Date  . Gout   . Hypertension   . Lupus ([place])   . Raynaud disease     Social History   Tobacco Use  . Smoking status: Never Smoker  . Smokeless tobacco: Never Used  Substance Use Topics  . Alcohol use: No  . Drug use: No    Family History  Problem Relation Age of Onset  . Aneurysm Mother   . Stroke Sister   . Seizures Sister   . Lung cancer Maternal Aunt   . Heart attack Paternal Uncle   . Colon cancer Neg Hx   . Esophageal cancer Neg Hx   . Pancreatic cancer Neg Hx   . Stomach cancer Neg Hx    Allergies  Allergen Reactions  . Hydroxychloroquine Rash  . Latex Rash    OBJECTIVE: Vitals:   06/13/18 1411  BP: 118/83  Pulse: (!) 132  Temp: 98.5 F (36.9 C)  Weight: 155 lb (70.3  kg)  Height: 5\' 3"  (1.6 m)   Body mass index is 27.46 kg/m.   Physical Exam  Constitutional: She is oriented to person, place, and time.  She is pleasant and in no distress.  She is accompanied by her sister.  HENT:  Mouth/Throat: No oropharyngeal exudate.  She has a few shallow ulcers on the inside of her lower lip.  She has full braces.  She does not have thrush at this time.  Eyes: Conjunctivae are normal.  Neck: Neck supple.  Cardiovascular: Regular rhythm and  normal heart sounds.  No murmur heard. She is tachycardic.  Pulmonary/Chest: Effort normal and breath sounds normal.  Abdominal: Soft. She exhibits no distension. There is no tenderness.  Musculoskeletal:  She has diffuse swelling of both hands.  Neurological: She is alert and oriented to person, place, and time.  Skin:  She has bilateral pulmonary erythema.  She has dry, hyperpigmented and excoriated skin on her forearms and abdomen.  She has a chronic ulcer just in front of her right ear.  Psychiatric: She has a normal mood and affect.    Microbiology: Recent Results (from the past 240 hour(s))  Culture, blood (x 2)     Status: None   Collection Time: 06/05/18  8:15 PM  Result Value Ref Range Status   Specimen Description BLOOD LEFT ANTECUBITAL  Final   Special Requests   Final    BOTTLES DRAWN AEROBIC ONLY Blood Culture adequate volume   Culture   Final    NO GROWTH 5 DAYS Performed at [facility], [address], [place], [place] [address]    Report Status 06/10/2018 FINAL  Final  Culture, blood (x 2)     Status: None   Collection Time: 06/05/18  8:23 PM  Result Value Ref Range Status   Specimen Description BLOOD LEFT HAND  Final   Special Requests   Final    BOTTLES DRAWN AEROBIC AND ANAEROBIC Blood Culture adequate volume   Culture   Final    NO GROWTH 5 DAYS Performed at [facility], [place] [address] [place], [place] [address]    Report Status 06/10/2018 FINAL  Final  Culture, Urine      Status: Abnormal   Collection Time: 06/06/18  7:59 PM  Result Value Ref Range Status   Specimen Description URINE, RANDOM  Final   Special Requests NONE  Final   Culture (A)  Final    <10,000 COLONIES/mL INSIGNIFICANT GROWTH Performed at [facility], [place] [address] [place], [place] [address]    Report Status 06/08/2018 FINAL  Final  C difficile quick scan w PCR reflex     Status: None   Collection Time: 06/10/18  3:41 AM  Result Value Ref Range Status   C Diff antigen NEGATIVE NEGATIVE Final   C Diff toxin NEGATIVE NEGATIVE Final   C Diff interpretation No C. difficile detected.  Final    Comment: Performed at [facility], [place] [address] [place], [place] [address]    Michel Bickers, [facility] [phone] pager   [phone] cell 06/13/2018, 2:32 PM

## 2018-08-30 ENCOUNTER — Ambulatory Visit (INDEPENDENT_AMBULATORY_CARE_PROVIDER_SITE_OTHER): Payer: Self-pay | Admitting: Family Medicine

## 2018-08-30 VITALS — BP 119/82 | HR 114 | Temp 99.1°F | Resp 14 | Ht 63.0 in | Wt 138.0 lb

## 2018-08-30 DIAGNOSIS — R300 Dysuria: Secondary | ICD-10-CM

## 2018-08-30 DIAGNOSIS — M170 Bilateral primary osteoarthritis of knee: Secondary | ICD-10-CM

## 2018-08-30 DIAGNOSIS — K649 Unspecified hemorrhoids: Secondary | ICD-10-CM

## 2018-08-30 DIAGNOSIS — Z3042 Encounter for surveillance of injectable contraceptive: Secondary | ICD-10-CM

## 2018-08-30 DIAGNOSIS — N898 Other specified noninflammatory disorders of vagina: Secondary | ICD-10-CM

## 2018-08-30 LAB — POCT URINALYSIS DIPSTICK
Glucose, UA: NEGATIVE
Ketones, UA: NEGATIVE
Leukocytes, UA: NEGATIVE
Nitrite, UA: NEGATIVE
Protein, UA: POSITIVE — AB
Spec Grav, UA: 1.025 (ref 1.010–1.025)
Urobilinogen, UA: 0.2 E.U./dL
pH, UA: 7 (ref 5.0–8.0)

## 2018-08-30 LAB — POCT URINE PREGNANCY: Preg Test, Ur: NEGATIVE

## 2018-08-30 MED ORDER — HYDROCODONE-ACETAMINOPHEN 5-325 MG PO TABS: 1.0000 | ORAL_TABLET | Freq: Four times a day (QID) | ORAL | 0 refills | Status: DC | PRN

## 2018-08-30 MED ORDER — METRONIDAZOLE 500 MG PO TABS: 500.0000 mg | ORAL_TABLET | Freq: Three times a day (TID) | ORAL | 0 refills | Status: DC

## 2018-08-30 MED ORDER — MEDROXYPROGESTERONE ACETATE 150 MG/ML IM SUSP
150.0000 mg | Freq: Once | INTRAMUSCULAR | Status: AC
  Administered 2018-08-30: 150 mg via INTRAMUSCULAR

## 2018-08-30 MED ORDER — FLUCONAZOLE 150 MG PO TABS: 150.0000 mg | ORAL_TABLET | Freq: Once | ORAL | 0 refills | Status: AC

## 2018-08-30 MED ORDER — HYDROCORTISONE 2.5 % RE CREA: 1.0000 "application " | TOPICAL_CREAM | Freq: Two times a day (BID) | RECTAL | 0 refills | Status: DC

## 2018-08-30 NOTE — Progress Notes (Signed)
Patient [facility]   Progress Note: General Provider: [name], FNP  SUBJECTIVE:   [name] [name] is a 22 y.o. female who  has a past medical history of Gout, Hypertension, Lupus ([place]), and Raynaud disease.. Patient presents today for Vaginitis and Contraception (wants depo injection ) Patient reports missing her last depo injection. Since then, she has been having vaginal bleeding. Also states that she has vaginal itching that may be caused from wearing menstrual pads. Also states that she has a history of hemorrhoids and is having a flare.  Review of Systems  Constitutional: Negative.   HENT: Negative.   Eyes: Negative.   Respiratory: Negative.   Cardiovascular: Negative.   Gastrointestinal: Negative.   Genitourinary: Negative.        Vaginal itching  Musculoskeletal: Negative.   Skin: Negative.   Neurological: Negative.   Psychiatric/Behavioral: Negative.      OBJECTIVE: BP 119/82 (BP Location: Left Arm, Patient Position: Sitting, Cuff Size: Normal)   Pulse (!) 114   Temp 99.1 F (37.3 C) (Oral)   Resp 14   Ht 5\' 3"  (1.6 m)   Wt 138 lb (62.6 kg)   LMP 07/27/2018   SpO2 98%   BMI 24.45 kg/m   Wt Readings from Last 3 Encounters:  08/30/18 138 lb (62.6 kg)  06/13/18 155 lb (70.3 kg)  06/10/18 156 lb 8 oz (71 kg)     Physical Exam Vitals signs and nursing note reviewed.  Constitutional:      General: She is not in acute distress.    Appearance: She is well-developed.  HENT:     Head: Normocephalic and atraumatic.  Eyes:     Conjunctiva/sclera: Conjunctivae normal.     Pupils: Pupils are equal, round, and reactive to light.  Neck:     Musculoskeletal: Normal range of motion.  Cardiovascular:     Rate and Rhythm: Normal rate and regular rhythm.     Heart sounds: Normal heart sounds.  Pulmonary:     Effort: Pulmonary effort is normal. No respiratory distress.     Breath sounds: Normal breath sounds.  Abdominal:       General: Bowel sounds are normal. There is no distension.     Palpations: Abdomen is soft.  Musculoskeletal: Normal range of motion.  Skin:    General: Skin is warm and dry.  Neurological:     Mental Status: She is alert and oriented to person, place, and time.  Psychiatric:        Behavior: Behavior normal.        Thought Content: Thought content normal.     ASSESSMENT/PLAN:   1. Dysuria - Urinalysis Dipstick  2. Vaginal itching - Vaginitis/Vaginosis, DNA Probe - metroNIDAZOLE (FLAGYL) 500 MG tablet; Take 1 tablet (500 mg total) by mouth 3 (three) times daily.  Dispense: 21 tablet; Refill: 0 - fluconazole (DIFLUCAN) 150 MG tablet; Take 1 tablet (150 mg total) by mouth once for 1 dose.  Dispense: 1 tablet; Refill: 0  3. Depo-Provera contraceptive status - POCT urine pregnancy - medroxyPROGESTERone (DEPO-PROVERA) injection 150 mg  4. Hemorrhoids, unspecified hemorrhoid type - hydrocortisone (ANUSOL-HC) 2.5 % rectal cream; Place 1 application rectally 2 (two) times daily.  Dispense: 30 g; Refill: 0  5. Primary osteoarthritis of both knees - HYDROcodone-acetaminophen (NORCO/VICODIN) 5-325 MG tablet; Take 1 tablet by mouth every 6 (six) hours as needed for up to 15 days.  Dispense: 30 tablet; Refill: 0  The patient was given clear instructions to go to ER or return to medical center if symptoms do not improve, worsen or new problems develop. The patient verbalized understanding and agreed with plan of care.   [name], FNP-BC [facility] [address] [place], [place] [address] [phone]     This note has been created with Dragon speech recognition software and smart phrase technology. Any transcriptional errors are unintentional.

## 2018-08-30 NOTE — Patient Instructions (Addendum)
Hemorrhoids Hemorrhoids are swollen veins that may develop:  In the butt (rectum). These are called internal hemorrhoids.  Around the opening of the butt (anus). These are called external hemorrhoids. Hemorrhoids can cause pain, itching, or bleeding. Most of the time, they do not cause serious problems. They usually get better with diet changes, lifestyle changes, and other home treatments. What are the causes? This condition may be caused by:  Having trouble pooping (constipation).  Pushing hard (straining) to poop.  Watery poop (diarrhea).  Pregnancy.  Being very overweight (obese).  Sitting for long periods of time.  Heavy lifting or other activity that causes you to strain.  Anal sex.  Riding a bike for a long period of time. What are the signs or symptoms? Symptoms of this condition include:  Pain.  Itching or soreness in the butt.  Bleeding from the butt.  Leaking poop.  Swelling in the area.  One or more lumps around the opening of your butt. How is this diagnosed? A doctor can often diagnose this condition by looking at the affected area. The doctor may also:  Do an exam that involves feeling the area with a gloved hand (digital rectal exam).  Examine the area inside your butt using a small tube (anoscope).  Order blood tests. This may be done if you have lost a lot of blood.  Have you get a test that involves looking inside the colon using a flexible tube with a camera on the end (sigmoidoscopy or colonoscopy). How is this treated? This condition can usually be treated at home. Your doctor may tell you to change what you eat, make lifestyle changes, or try home treatments. If these do not help, procedures can be done to remove the hemorrhoids or make them smaller. These may involve:  Placing rubber bands at the base of the hemorrhoids to cut off their blood supply.  Injecting medicine into the hemorrhoids to shrink them.  Shining a type of light  energy onto the hemorrhoids to cause them to fall off.  Doing surgery to remove the hemorrhoids or cut off their blood supply. Follow these instructions at home: Eating and drinking   Eat foods that have a lot of fiber in them. These include whole grains, beans, nuts, fruits, and vegetables.  Ask your doctor about taking products that have added fiber (fibersupplements).  Reduce the amount of fat in your diet. You can do this by: ? Eating low-fat dairy products. ? Eating less red meat. ? Avoiding processed foods.  Drink enough fluid to keep your pee (urine) pale yellow. Managing pain and swelling   Take a warm-water bath (sitz bath) for 20 minutes to ease pain. Do this 3-4 times a day. You may do this in a bathtub or using a portable sitz bath that fits over the toilet.  If told, put ice on the painful area. It may be helpful to use ice between your warm baths. ? Put ice in a plastic bag. ? Place a towel between your skin and the bag. ? Leave the ice on for 20 minutes, 2-3 times a day. General instructions  Take over-the-counter and prescription medicines only as told by your doctor. ? Medicated creams and medicines may be used as told.  Exercise often. Ask your doctor how much and what kind of exercise is best for you.  Go to the bathroom when you have the urge to poop. Do not wait.  Avoid pushing too hard when you poop.  Keep your   butt dry and clean. Use wet toilet paper or moist towelettes after pooping.  Do not sit on the toilet for a long time.  Keep all follow-up visits as told by your doctor. This is important. Contact a doctor if you:  Have pain and swelling that do not get better with treatment or medicine.  Have trouble pooping.  Cannot poop.  Have pain or swelling outside the area of the hemorrhoids. Get help right away if you have:  Bleeding that will not stop. Summary  Hemorrhoids are swollen veins in the butt or around the opening of the  butt.  They can cause pain, itching, or bleeding.  Eat foods that have a lot of fiber in them. These include whole grains, beans, nuts, fruits, and vegetables.  Take a warm-water bath (sitz bath) for 20 minutes to ease pain. Do this 3-4 times a day. This information is not intended to replace advice given to you by your health care provider. Make sure you discuss any questions you have with your health care provider. Document Released: 06/07/2008 Document Revised: 01/18/2018 Document Reviewed: 01/18/2018 Elsevier Interactive Patient Education  2019 [employer]. Cough, Adult  A cough helps to clear your throat and lungs. A cough may last only 2-3 weeks (acute), or it may last longer than 8 weeks (chronic). Many different things can cause a cough. A cough may be a sign of an illness or another medical condition. Follow these instructions at home:  Pay attention to any changes in your cough.  Take medicines only as told by your doctor. ? If you were prescribed an antibiotic medicine, take it as told by your doctor. Do not stop taking it even if you start to feel better. ? Talk with your doctor before you try using a cough medicine.  Drink enough fluid to keep your pee (urine) clear or pale yellow.  If the air is dry, use a cold steam vaporizer or humidifier in your home.  Stay away from things that make you cough at work or at home.  If your cough is worse at night, try using extra pillows to raise your head up higher while you sleep.  Do not smoke, and try not to be around smoke. If you need help quitting, ask your doctor.  Do not have caffeine.  Do not drink alcohol.  Rest as needed. Contact a doctor if:  You have new problems (symptoms).  You cough up yellow fluid (pus).  Your cough does not get better after 2-3 weeks, or your cough gets worse.  Medicine does not help your cough and you are not sleeping well.  You have pain that gets worse or pain that is not helped  with medicine.  You have a fever.  You are losing weight and you do not know why.  You have night sweats. Get help right away if:  You cough up blood.  You have trouble breathing.  Your heartbeat is very fast. This information is not intended to replace advice given to you by your health care provider. Make sure you discuss any questions you have with your health care provider. Document Released: 05/12/2011 Document Revised: 02/04/2016 Document Reviewed: 11/05/2014 Elsevier Interactive Patient Education  2019 [employer]. Vaginitis  Vaginitis is irritation and swelling (inflammation) of the vagina. It happens when normal bacteria and yeast in the vagina grow too much. There are many types of this condition. Treatment will depend on the type you have. Follow these instructions at home: Lifestyle  Keep your vagina area clean and dry. ? Avoid using soap. ? Rinse the area with water.  Do not do the following until your doctor says it is okay: ? Wash and clean out the vagina (douche). ? Use tampons. ? Have sex.  Wipe from front to back after going to the bathroom.  Let air reach your vagina. ? Wear cotton underwear. ? Do not wear: ? Underwear while you sleep. ? Tight pants. ? Thong underwear. ? Underwear or nylons without a cotton panel. ? Take off any wet clothing, such as bathing suits, as soon as possible.  Use gentle, non-scented products. Do not use things that can irritate the vagina, such as fabric softeners. Avoid the following products if they are scented: ? Feminine sprays. ? Detergents. ? Tampons. ? Feminine hygiene products. ? Soaps or bubble baths.  Practice safe sex and use condoms. General instructions  Take over-the-counter and prescription medicines only as told by your doctor.  If you were prescribed an antibiotic medicine, take or use it as told by your doctor. Do not stop taking or using the antibiotic even if you start to feel better.  Keep  all follow-up visits as told by your doctor. This is important. Contact a doctor if:  You have pain in your belly.  You have a fever.  Your symptoms last for more than 2-3 days. Get help right away if:  You have a fever and your symptoms get worse all of a sudden. Summary  Vaginitis is irritation and swelling of the vagina. It can happen when the normal bacteria and yeast in the vagina grow too much. There are many types.  Treatment will depend on the type you have.  Do not douche, use tampons , or have sex until your health care provider approves. When you can return to sex, practice safe sex and use condoms. This information is not intended to replace advice given to you by your health care provider. Make sure you discuss any questions you have with your health care provider. Document Released: 11/25/2008 Document Revised: 09/20/2016 Document Reviewed: 09/20/2016 Elsevier Interactive Patient Education  2019 [employer]. Medroxyprogesterone injection [Contraceptive] What is this medicine? MEDROXYPROGESTERONE (me DROX ee proe JES te rone) contraceptive injections prevent pregnancy. They provide effective birth control for 3 months. Depo-subQ Provera 104 is also used for treating pain related to endometriosis. This medicine may be used for other purposes; ask your health care provider or pharmacist if you have questions. COMMON BRAND NAME(S): Depo-Provera, Depo-subQ Provera 104 What should I tell my health care provider before I take this medicine? They need to know if you have any of these conditions: -frequently drink alcohol -asthma -blood vessel disease or a history of a blood clot in the lungs or legs -bone disease such as osteoporosis -breast cancer -diabetes -eating disorder (anorexia nervosa or bulimia) -high blood pressure -HIV infection or AIDS -kidney disease -liver disease -mental depression -migraine -seizures (convulsions) -stroke -tobacco smoker -vaginal  bleeding -an unusual or allergic reaction to medroxyprogesterone, other hormones, medicines, foods, dyes, or preservatives -pregnant or trying to get pregnant -breast-feeding How should I use this medicine? Depo-Provera Contraceptive injection is given into a muscle. Depo-subQ Provera 104 injection is given under the skin. These injections are given by a health care professional. You must not be pregnant before getting an injection. The injection is usually given during the first 5 days after the start of a menstrual period or 6 weeks after delivery of a baby. Talk to  your pediatrician regarding the use of this medicine in children. Special care may be needed. These injections have been used in female children who have started having menstrual periods. Overdosage: If you think you have taken too much of this medicine contact a poison control center or emergency room at once. NOTE: This medicine is only for you. Do not share this medicine with others. What if I miss a dose? Try not to miss a dose. You must get an injection once every 3 months to maintain birth control. If you cannot keep an appointment, call and reschedule it. If you wait longer than 13 weeks between Depo-Provera contraceptive injections or longer than 14 weeks between Depo-subQ Provera 104 injections, you could get pregnant. Use another method for birth control if you miss your appointment. You may also need a pregnancy test before receiving another injection. What may interact with this medicine? Do not take this medicine with any of the following medications: -bosentan This medicine may also interact with the following medications: -aminoglutethimide -antibiotics or medicines for infections, especially rifampin, rifabutin, rifapentine, and griseofulvin -aprepitant -barbiturate medicines such as phenobarbital or primidone -bexarotene -carbamazepine -medicines for seizures like ethotoin, felbamate, oxcarbazepine, phenytoin,  topiramate -modafinil -St. John's wort This list may not describe all possible interactions. Give your health care provider a list of all the medicines, herbs, non-prescription drugs, or dietary supplements you use. Also tell them if you smoke, drink alcohol, or use illegal drugs. Some items may interact with your medicine. What should I watch for while using this medicine? This drug does not protect you against HIV infection (AIDS) or other sexually transmitted diseases. Use of this product may cause you to lose calcium from your bones. Loss of calcium may cause weak bones (osteoporosis). Only use this product for more than 2 years if other forms of birth control are not right for you. The longer you use this product for birth control the more likely you will be at risk for weak bones. Ask your health care professional how you can keep strong bones. You may have a change in bleeding pattern or irregular periods. Many females stop having periods while taking this drug. If you have received your injections on time, your chance of being pregnant is very low. If you think you may be pregnant, see your health care professional as soon as possible. Tell your health care professional if you want to get pregnant within the next year. The effect of this medicine may last a long time after you get your last injection. What side effects may I notice from receiving this medicine? Side effects that you should report to your doctor or health care professional as soon as possible: -allergic reactions like skin rash, itching or hives, swelling of the face, lips, or tongue -breast tenderness or discharge -breathing problems -changes in vision -depression -feeling faint or lightheaded, falls -fever -pain in the abdomen, chest, groin, or leg -problems with balance, talking, walking -unusually weak or tired -yellowing of the eyes or skin Side effects that usually do not require medical attention (report to your  doctor or health care professional if they continue or are bothersome): -acne -fluid retention and swelling -headache -irregular periods, spotting, or absent periods -temporary pain, itching, or skin reaction at site where injected -weight gain This list may not describe all possible side effects. Call your doctor for medical advice about side effects. You may report side effects to FDA at 1-800-FDA-1088. Where should I keep my medicine? This does not  apply. The injection will be given to you by a health care professional. NOTE: This sheet is a summary. It may not cover all possible information. If you have questions about this medicine, talk to your doctor, pharmacist, or health care provider.  2019 Elsevier/Gold Standard (2008-09-19 18:37:56)

## 2018-09-01 LAB — VAGINITIS/VAGINOSIS, DNA PROBE
Candida Species: NEGATIVE
Gardnerella vaginalis: POSITIVE — AB
Trichomonas vaginosis: NEGATIVE

## 2018-09-14 ENCOUNTER — Encounter: Payer: Self-pay | Admitting: Family Medicine

## 2018-09-28 ENCOUNTER — Ambulatory Visit (INDEPENDENT_AMBULATORY_CARE_PROVIDER_SITE_OTHER): Payer: Self-pay | Admitting: Family Medicine

## 2018-09-28 VITALS — BP 125/78 | HR 107 | Temp 99.6°F | Resp 16 | Ht 63.0 in | Wt 138.0 lb

## 2018-09-28 DIAGNOSIS — L309 Dermatitis, unspecified: Secondary | ICD-10-CM

## 2018-09-28 DIAGNOSIS — N898 Other specified noninflammatory disorders of vagina: Secondary | ICD-10-CM | POA: Diagnosis not present

## 2018-09-28 MED ORDER — NYSTATIN-TRIAMCINOLONE 100000-0.1 UNIT/GM-% EX OINT: 1.0000 "application " | TOPICAL_OINTMENT | Freq: Two times a day (BID) | CUTANEOUS | 0 refills | Status: DC

## 2018-09-28 MED ORDER — FLUCONAZOLE 150 MG PO TABS: 150.0000 mg | ORAL_TABLET | Freq: Once | ORAL | 0 refills | Status: AC

## 2018-09-28 NOTE — Patient Instructions (Signed)
Vaginitis    Vaginitis is irritation and swelling (inflammation) of the vagina. It happens when normal bacteria and yeast in the vagina grow too much. There are many types of this condition. Treatment will depend on the type you have.  Follow these instructions at home:  Lifestyle  · Keep your vagina area clean and dry.  ? Avoid using soap.  ? Rinse the area with water.  · Do not do the following until your doctor says it is okay:  ? Wash and clean out the vagina (douche).  ? Use tampons.  ? Have sex.  · Wipe from front to back after going to the bathroom.  · Let air reach your vagina.  ? Wear cotton underwear.  ? Do not wear:  ? Underwear while you sleep.  ? Tight pants.  ? Thong underwear.  ? Underwear or nylons without a cotton panel.  ? Take off any wet clothing, such as bathing suits, as soon as possible.  · Use gentle, non-scented products. Do not use things that can irritate the vagina, such as fabric softeners. Avoid the following products if they are scented:  ? Feminine sprays.  ? Detergents.  ? Tampons.  ? Feminine hygiene products.  ? Soaps or bubble baths.  · Practice safe sex and use condoms.  General instructions  · Take over-the-counter and prescription medicines only as told by your doctor.  · If you were prescribed an antibiotic medicine, take or use it as told by your doctor. Do not stop taking or using the antibiotic even if you start to feel better.  · Keep all follow-up visits as told by your doctor. This is important.  Contact a doctor if:  · You have pain in your belly.  · You have a fever.  · Your symptoms last for more than 2-3 days.  Get help right away if:  · You have a fever and your symptoms get worse all of a sudden.  Summary  · Vaginitis is irritation and swelling of the vagina. It can happen when the normal bacteria and yeast in the vagina grow too much. There are many types.  · Treatment will depend on the type you have.  · Do not douche, use tampons , or have sex until your health  care provider approves. When you can return to sex, practice safe sex and use condoms.  This information is not intended to replace advice given to you by your health care provider. Make sure you discuss any questions you have with your health care provider.  Document Released: 11/25/2008 Document Revised: 09/20/2016 Document Reviewed: 09/20/2016  Elsevier Interactive Patient Education © 2019 Elsevier Inc.

## 2018-09-28 NOTE — Progress Notes (Signed)
  [facility]   Progress Note: General Provider: [name], FNP  SUBJECTIVE:   [name] [name] is a 23 y.o. female who  has a past medical history of Gout, Hypertension, Lupus ([place]), and Raynaud disease.. Patient presents today for Vaginal Discharge (vaginal discharge/yellowish ) and Skin Problem (skin peeling around eye ) Patient presents with vaginal irritation and discharge x 1 week. Patient states that she is having difficulty with her lupus. 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    Review of Systems  Constitutional: Negative.   HENT: Negative.   Eyes: Negative.   Respiratory: Negative.   Cardiovascular: Negative.   Gastrointestinal: Negative.   Genitourinary: Negative for dysuria.       Vaginal discharge and irritation.   Musculoskeletal: Negative.   Skin: Negative.   Neurological: Negative.   Psychiatric/Behavioral: Negative.      OBJECTIVE: BP 125/78 (BP Location: Right Arm, Patient Position: Sitting, Cuff Size: Normal)   Pulse (!) 107   Temp 99.6 F (37.6 C) (Oral)   Resp 16   Ht 5\' 3"  (1.6 m)   Wt 138 lb (62.6 kg)   BMI 24.45 kg/m   Wt Readings from Last 3 Encounters:  09/28/18 138 lb (62.6 kg)  08/30/18 138 lb (62.6 kg)  06/13/18 155 lb (70.3 kg)     Physical Exam Vitals signs and nursing note reviewed. Exam conducted with a chaperone present.  Genitourinary:    Exam position: Lithotomy position.     Labia:        Right: Tenderness and lesion (excoriation of tissue. ) present.      Vagina: Vaginal discharge present.     Cervix: Normal.     Uterus: Normal.      ASSESSMENT/PLAN:   1. Vaginal itching - Vaginitis/Vaginosis, DNA Probe - Herpes simplex virus culture - GC/Chlamydia Probe Amp([facility]) - fluconazole (DIFLUCAN) 150 MG tablet; Take 1 tablet (150 mg total) by mouth once for 1 dose. Take one tablet now and repeat in 72 hours  Dispense: 2 tablet; Refill:  0 - metroNIDAZOLE (FLAGYL) 500 MG tablet; Take 1 tablet (500 mg total) by mouth 2 (two) times daily for 7 days.  Dispense: 14 tablet; Refill: 0 - fluconazole (DIFLUCAN) 150 MG tablet; Take 1 tablet (150 mg total) by mouth 2 (two) times a week. Take one tablet now and repeat in 3 days if symptoms persist.  Dispense: 2 tablet; Refill: 0  2. Dermatitis - nystatin-triamcinolone ointment (MYCOLOG); Apply 1 application topically 2 (two) times daily.  Dispense: 30 g; Refill: 0    Lupus: Patient is being followed by a specialist for this condition. Patient advised to continue with follow up appointments. PCP will continue to monitor progress.   Return if symptoms worsen or fail to improve.    The patient was given clear instructions to go to ER or return to medical center if symptoms do not improve, worsen or new problems develop. The patient verbalized understanding and agreed with plan of care.   [name], FNP-BC [facility] [address] [place], [place] [address] [phone]

## 2018-09-30 LAB — VAGINITIS/VAGINOSIS, DNA PROBE
Candida Species: POSITIVE — AB
Gardnerella vaginalis: POSITIVE — AB
Trichomonas vaginosis: NEGATIVE

## 2018-10-01 LAB — HERPES SIMPLEX VIRUS CULTURE

## 2018-10-02 DIAGNOSIS — M329 Systemic lupus erythematosus, unspecified: Secondary | ICD-10-CM | POA: Diagnosis not present

## 2018-10-02 DIAGNOSIS — Z79899 Other long term (current) drug therapy: Secondary | ICD-10-CM | POA: Diagnosis not present

## 2018-10-02 DIAGNOSIS — I7300 Raynaud's syndrome without gangrene: Secondary | ICD-10-CM | POA: Diagnosis not present

## 2018-10-02 LAB — GC/CHLAMYDIA PROBE AMP
Chlamydia trachomatis, NAA: NEGATIVE
Neisseria gonorrhoeae by PCR: NEGATIVE

## 2018-10-04 MED ORDER — METRONIDAZOLE 500 MG PO TABS: 500.0000 mg | ORAL_TABLET | Freq: Two times a day (BID) | ORAL | 0 refills | Status: AC

## 2018-10-04 MED ORDER — FLUCONAZOLE 150 MG PO TABS: 150.0000 mg | ORAL_TABLET | ORAL | 0 refills | Status: DC

## 2018-10-11 ENCOUNTER — Encounter: Payer: Self-pay | Admitting: Family Medicine

## 2018-10-25 DIAGNOSIS — Z79899 Other long term (current) drug therapy: Secondary | ICD-10-CM | POA: Diagnosis not present

## 2018-10-25 DIAGNOSIS — M329 Systemic lupus erythematosus, unspecified: Secondary | ICD-10-CM | POA: Diagnosis not present

## 2018-10-25 DIAGNOSIS — I7300 Raynaud's syndrome without gangrene: Secondary | ICD-10-CM | POA: Diagnosis not present

## 2018-11-06 DIAGNOSIS — R809 Proteinuria, unspecified: Secondary | ICD-10-CM | POA: Diagnosis not present

## 2018-11-06 DIAGNOSIS — M3219 Other organ or system involvement in systemic lupus erythematosus: Secondary | ICD-10-CM | POA: Diagnosis not present

## 2018-11-14 DIAGNOSIS — R809 Proteinuria, unspecified: Secondary | ICD-10-CM | POA: Insufficient documentation

## 2018-11-14 DIAGNOSIS — M3214 Glomerular disease in systemic lupus erythematosus: Secondary | ICD-10-CM | POA: Diagnosis not present

## 2018-11-14 DIAGNOSIS — R801 Persistent proteinuria, unspecified: Secondary | ICD-10-CM | POA: Diagnosis not present

## 2018-11-14 DIAGNOSIS — M329 Systemic lupus erythematosus, unspecified: Secondary | ICD-10-CM | POA: Diagnosis not present

## 2018-11-14 DIAGNOSIS — Z79899 Other long term (current) drug therapy: Secondary | ICD-10-CM | POA: Diagnosis not present

## 2018-11-14 DIAGNOSIS — I10 Essential (primary) hypertension: Secondary | ICD-10-CM | POA: Diagnosis not present

## 2018-11-15 DIAGNOSIS — I10 Essential (primary) hypertension: Secondary | ICD-10-CM | POA: Diagnosis not present

## 2018-11-15 DIAGNOSIS — M3214 Glomerular disease in systemic lupus erythematosus: Secondary | ICD-10-CM | POA: Diagnosis not present

## 2018-11-15 DIAGNOSIS — R809 Proteinuria, unspecified: Secondary | ICD-10-CM | POA: Diagnosis not present

## 2018-11-15 DIAGNOSIS — Z79899 Other long term (current) drug therapy: Secondary | ICD-10-CM | POA: Diagnosis not present

## 2018-11-19 ENCOUNTER — Ambulatory Visit: Payer: Self-pay

## 2018-11-19 DIAGNOSIS — Z3042 Encounter for surveillance of injectable contraceptive: Secondary | ICD-10-CM

## 2018-11-19 LAB — POCT URINE PREGNANCY: Preg Test, Ur: NEGATIVE

## 2018-11-19 MED ORDER — MEDROXYPROGESTERONE ACETATE 150 MG/ML IM SUSP
150.0000 mg | Freq: Once | INTRAMUSCULAR | Status: AC
  Administered 2018-11-19: 150 mg via INTRAMUSCULAR

## 2018-11-19 NOTE — Progress Notes (Unsigned)
POC

## 2019-01-25 ENCOUNTER — Emergency Department (HOSPITAL_COMMUNITY)
Admission: EM | Admit: 2019-01-25 | Discharge: 2019-01-25 | Disposition: A | Discharge status: Home / Self Care | Payer: BLUE CROSS/BLUE SHIELD | Attending: Emergency Medicine | Admitting: Emergency Medicine

## 2019-01-25 ENCOUNTER — Encounter (HOSPITAL_COMMUNITY): Payer: Self-pay | Admitting: Emergency Medicine

## 2019-01-25 ENCOUNTER — Other Ambulatory Visit: Payer: Self-pay

## 2019-01-25 DIAGNOSIS — E860 Dehydration: Secondary | ICD-10-CM | POA: Insufficient documentation

## 2019-01-25 DIAGNOSIS — R197 Diarrhea, unspecified: Secondary | ICD-10-CM | POA: Diagnosis not present

## 2019-01-25 DIAGNOSIS — Z79899 Other long term (current) drug therapy: Secondary | ICD-10-CM | POA: Diagnosis not present

## 2019-01-25 DIAGNOSIS — I10 Essential (primary) hypertension: Secondary | ICD-10-CM | POA: Insufficient documentation

## 2019-01-25 DIAGNOSIS — Z91040 Latex allergy status: Secondary | ICD-10-CM | POA: Insufficient documentation

## 2019-01-25 DIAGNOSIS — N3000 Acute cystitis without hematuria: Secondary | ICD-10-CM | POA: Diagnosis not present

## 2019-01-25 DIAGNOSIS — M329 Systemic lupus erythematosus, unspecified: Secondary | ICD-10-CM | POA: Diagnosis not present

## 2019-01-25 DIAGNOSIS — M791 Myalgia, unspecified site: Secondary | ICD-10-CM | POA: Diagnosis present

## 2019-01-25 LAB — CBC WITH DIFFERENTIAL/PLATELET
Abs Immature Granulocytes: 0.05 10*3/uL (ref 0.00–0.07)
Basophils Absolute: 0 10*3/uL (ref 0.0–0.1)
Basophils Relative: 0 %
Eosinophils Absolute: 0 10*3/uL (ref 0.0–0.5)
Eosinophils Relative: 0 %
HCT: 34.3 % — ABNORMAL LOW (ref 36.0–46.0)
Hemoglobin: 10.8 g/dL — ABNORMAL LOW (ref 12.0–15.0)
Immature Granulocytes: 2 %
Lymphocytes Relative: 19 %
Lymphs Abs: 0.5 10*3/uL — ABNORMAL LOW (ref 0.7–4.0)
MCH: 27.7 pg (ref 26.0–34.0)
MCHC: 31.5 g/dL (ref 30.0–36.0)
MCV: 87.9 fL (ref 80.0–100.0)
Monocytes Absolute: 0.2 10*3/uL (ref 0.1–1.0)
Monocytes Relative: 6 %
Neutro Abs: 2.1 10*3/uL (ref 1.7–7.7)
Neutrophils Relative %: 73 %
Platelets: 161 10*3/uL (ref 150–400)
RBC: 3.9 MIL/uL (ref 3.87–5.11)
RDW: 15.9 % — ABNORMAL HIGH (ref 11.5–15.5)
WBC: 2.9 10*3/uL — ABNORMAL LOW (ref 4.0–10.5)
nRBC: 0 % (ref 0.0–0.2)

## 2019-01-25 LAB — COMPREHENSIVE METABOLIC PANEL
ALT: 31 U/L (ref 0–44)
AST: 102 U/L — ABNORMAL HIGH (ref 15–41)
Albumin: 2.7 g/dL — ABNORMAL LOW (ref 3.5–5.0)
Alkaline Phosphatase: 64 U/L (ref 38–126)
Anion gap: 8 (ref 5–15)
BUN: 9 mg/dL (ref 6–20)
CO2: 20 mmol/L — ABNORMAL LOW (ref 22–32)
Calcium: 8.2 mg/dL — ABNORMAL LOW (ref 8.9–10.3)
Chloride: 109 mmol/L (ref 98–111)
Creatinine, Ser: 0.48 mg/dL (ref 0.44–1.00)
GFR calc Af Amer: >60 mL/min (ref >60)
GFR calc non Af Amer: >60 mL/min (ref >60)
Glucose, Bld: 84 mg/dL (ref 70–99)
Potassium: 3.9 mmol/L (ref 3.5–5.1)
Sodium: 137 mmol/L (ref 135–145)
Total Bilirubin: 0.4 mg/dL (ref 0.3–1.2)
Total Protein: 7.3 g/dL (ref 6.5–8.1)

## 2019-01-25 LAB — URINALYSIS, ROUTINE W REFLEX MICROSCOPIC
Bilirubin Urine: NEGATIVE
Glucose, UA: NEGATIVE mg/dL
Ketones, ur: NEGATIVE mg/dL
Nitrite: NEGATIVE
Protein, ur: 100 mg/dL — AB
Specific Gravity, Urine: 1.009 (ref 1.005–1.030)
pH: 7 (ref 5.0–8.0)

## 2019-01-25 LAB — PREGNANCY, URINE: Preg Test, Ur: NEGATIVE

## 2019-01-25 MED ORDER — DIPHENHYDRAMINE HCL 50 MG/ML IJ SOLN: 25.0000 mg | Freq: Once | INTRAMUSCULAR | Status: DC

## 2019-01-25 MED ORDER — DEXAMETHASONE SODIUM PHOSPHATE 10 MG/ML IJ SOLN
10.0000 mg | Freq: Once | INTRAMUSCULAR | Status: AC
  Administered 2019-01-25: 13:00:00 10 mg via INTRAVENOUS
  Filled 2019-01-25: qty 1

## 2019-01-25 MED ORDER — MORPHINE SULFATE (PF) 4 MG/ML IV SOLN
4.0000 mg | Freq: Once | INTRAVENOUS | Status: AC
  Administered 2019-01-25: 4 mg via INTRAVENOUS
  Filled 2019-01-25: qty 1

## 2019-01-25 MED ORDER — SODIUM CHLORIDE 0.9 % IV BOLUS
1000.0000 mL | Freq: Once | INTRAVENOUS | Status: AC
  Administered 2019-01-25: 1000 mL via INTRAVENOUS

## 2019-01-25 MED ORDER — PREDNISONE 50 MG PO TABS: 50.0000 mg | ORAL_TABLET | Freq: Every day | ORAL | 0 refills | Status: DC

## 2019-01-25 MED ORDER — SODIUM CHLORIDE 0.9 % IV BOLUS: 1000.0000 mL | Freq: Once | INTRAVENOUS | Status: DC

## 2019-01-25 MED ORDER — ONDANSETRON HCL 4 MG/2ML IJ SOLN
4.0000 mg | Freq: Once | INTRAMUSCULAR | Status: AC
  Administered 2019-01-25: 4 mg via INTRAVENOUS
  Filled 2019-01-25: qty 2

## 2019-01-25 MED ORDER — CEPHALEXIN 500 MG PO CAPS: 1000.0000 mg | ORAL_CAPSULE | Freq: Two times a day (BID) | ORAL | 0 refills | Status: DC

## 2019-01-25 MED ORDER — SODIUM CHLORIDE 0.9 % IV SOLN
1.0000 g | Freq: Once | INTRAVENOUS | Status: AC
  Administered 2019-01-25: 13:00:00 via INTRAVENOUS
  Filled 2019-01-25: qty 10

## 2019-01-25 MED ORDER — HYDROCODONE-ACETAMINOPHEN 5-325 MG PO TABS: 1.0000 | ORAL_TABLET | Freq: Four times a day (QID) | ORAL | 0 refills | Status: DC | PRN

## 2019-01-25 NOTE — ED Provider Notes (Signed)
[facility] DEPT Provider Note   CSN: [id number] Arrival date & time: 01/25/19  9163    History   Chief Complaint Chief Complaint  Patient presents with  . body pain  . Diarrhea    HPI [name] [name] is a 23 y.o. female.     HPI Patient presents to the emergency department with body aches that she feels is related to her lupus which she feels like is causing a flare.  The patient states she is also had diarrhea over the last couple weeks.  Patient states she feels dehydrated as well.  Patient states that she is had no other symptoms outside of these symptoms.  She states that she did not take any medications other than her prescribed medications prior to arrival.  The patient denies chest pain, shortness of breath, headache,blurred vision, neck pain, fever, cough, weakness, numbness, dizziness, anorexia, edema, abdominal pain, nausea, vomiting,rash, back pain, dysuria, hematemesis, bloody stool, near syncope, or syncope. Past Medical History:  Diagnosis Date  . Gout   . Hypertension   . Lupus ([place])   . Raynaud disease     Patient Active Problem List   Diagnosis Date Noted  . Relative acute adrenal insufficiency ([place]) 06/11/2018  . Anemia 06/09/2018  . Lobar pneumonia ([place]) 06/07/2018  . Pleuritic chest pain 06/05/2018  . Dysphagia 06/01/2018  . Odynophagia 06/01/2018  . Thrush 06/01/2018  . Mucositis oral 01/12/2018  . Vaginal candidiasis 01/12/2018  . Methotrexate toxicity   . Mouth ulcers   . Leukopenia 01/10/2018  . Accidental methotrexate overdose 01/08/2018  . Depression 02/08/2017  . Primary osteoarthritis of both knees 11/09/2016  . Chronic midline low back pain without sciatica 11/09/2016  . Vitamin D deficiency 11/09/2016  . SLE (systemic lupus erythematosus) ([employer]) 09/19/2016  . Insomnia 01/29/2016  . Depo-Provera contraceptive status 08/11/2015  . Raynaud's disease 08/11/2015  . Seasonal allergies 08/11/2015  . IDA (iron  deficiency anemia) 08/11/2015    Past Surgical History:  Procedure Laterality Date  . ORIF SHOULDER FRACTURE Left   . TONSILLECTOMY       OB History   No obstetric history on file.      Home Medications    Prior to Admission medications   Medication Sig Start Date End Date Taking? Authorizing Provider  amLODipine (NORVASC) 5 MG tablet Take 7.5 mg by mouth daily. 09/07/18  Yes [provider]  ferrous sulfate 325 (65 FE) MG tablet Take 1 tablet (325 mg total) by mouth 2 (two) times daily. With 1/2 glass of orange juice 12/15/15  Yes Davonna Belling, MD  folic acid (FOLVITE) 1 MG tablet TAKE 2 TABLETS (2 MG TOTAL) BY MOUTH DAILY. Patient taking differently: Take 1-2 mg by mouth 2 (two) times daily. Take 2 tablets (2mg ) in the morning and 1 tablet (1mg ) at bedtime 12/11/17  Yes Deveshwar, Abel Presto, MD  medroxyPROGESTERone (DEPO-PROVERA) 150 MG/ML injection Inject 150 mg into the muscle every 3 (three) months.   Yes [provider]  mycophenolate (CELLCEPT) 500 MG tablet Take 1,000 mg by mouth 2 (two) times daily. 10/30/18  Yes [provider]  tacrolimus (PROGRAF) 1 MG capsule Take 2 mg by mouth 2 (two) times daily.   Yes [provider]  Vitamin D, Ergocalciferol, (DRISDOL) 50000 units CAPS capsule Take 1 capsule (50,000 Units total) by mouth 2 (two) times a week. Patient taking differently: Take 50,000 Units by mouth every Sunday.  12/21/17  Yes Deveshwar, Abel Presto, MD  AMBULATORY NON FORMULARY MEDICATION  Medication Name: GI Cocktail 90 ml- viscvous lidocaine 2 % 90 ml Dicyclomine 10 / 5 ml 270 Maalox Total ml's is 450 ml's Take 30 cc's by mouth every 4-6 hours. Patient not taking: Reported on 01/25/2019 06/01/18   Zehr, Laban Emperor, PA-C  fluconazole (DIFLUCAN) 150 MG tablet Take 1 tablet (150 mg total) by mouth 2 (two) times a week. Take one tablet now and repeat in 3 days if symptoms persist. Patient not taking: Reported on 01/25/2019 10/04/18   [name], FNP  hydrocortisone (ANUSOL-HC) 2.5 % rectal cream Place 1 application rectally 2 (two) times daily. Patient not taking: Reported on 01/25/2019 08/30/18   [name], FNP  lidocaine (XYLOCAINE) 2 % solution Use as directed 15 mLs in the mouth or throat every 4 (four) hours as needed for mouth pain. Patient not taking: Reported on 08/30/2018 06/09/18   Charlynne Cousins, MD  nystatin (MYCOSTATIN) 100000 UNIT/ML suspension Take 5 mLs by mouth 4 (four) times daily.    [provider]  nystatin-triamcinolone ointment (MYCOLOG) Apply 1 application topically 2 (two) times daily. Patient not taking: Reported on 01/25/2019 09/28/18   [name], FNP  pantoprazole (PROTONIX) 20 MG tablet Take 1 tablet (20 mg total) by mouth daily. Patient not taking: Reported on 09/28/2018 05/30/18 08/30/18  Langston Masker B, PA-C  predniSONE (DELTASONE) 5 MG tablet Take 4 tablets (20 mg total) by mouth daily. Patient not taking: Reported on 01/25/2019 06/11/18   Charlynne Cousins, MD    Family History Family History  Problem Relation Age of Onset  . Aneurysm Mother   . Stroke Sister   . Seizures Sister   . Lung cancer Maternal Aunt   . Heart attack Paternal Uncle   . Colon cancer Neg Hx   . Esophageal cancer Neg Hx   . Pancreatic cancer Neg Hx   . Stomach cancer Neg Hx     Social History Social History   Tobacco Use  . Smoking status: Never Smoker  . Smokeless tobacco: Never Used  Substance Use Topics  . Alcohol use: No  . Drug use: No     Allergies   Hydroxychloroquine and Latex   Review of Systems Review of Systems All other systems negative except as documented in the HPI. All pertinent positives and negatives as reviewed in the HPI.  Physical Exam Updated Vital Signs BP (!) 131/93   Pulse 87   Temp 99.1 F (37.3 C) (Oral)   Resp 15   SpO2 100%   Physical Exam Vitals signs and nursing note reviewed.  Constitutional:      General: She is not in acute  distress.    Appearance: She is well-developed.  HENT:     Head: Normocephalic and atraumatic.  Eyes:     Pupils: Pupils are equal, round, and reactive to light.  Neck:     Musculoskeletal: Normal range of motion and neck supple.  Cardiovascular:     Rate and Rhythm: Normal rate and regular rhythm.     Heart sounds: Normal heart sounds. No murmur. No friction rub. No gallop.   Pulmonary:     Effort: Pulmonary effort is normal. No respiratory distress.     Breath sounds: Normal breath sounds. No wheezing.  Abdominal:     General: Bowel sounds are normal. There is no distension.     Palpations: Abdomen is soft.     Tenderness: There is no abdominal tenderness.  Skin:    General: Skin is warm and dry.  Capillary Refill: Capillary refill takes less than 2 seconds.     Findings: No erythema or rash.  Neurological:     Mental Status: She is alert and oriented to person, place, and time.     Motor: No abnormal muscle tone.     Coordination: Coordination normal.  Psychiatric:        Mood and Affect: Mood normal.        Behavior: Behavior normal.      ED Treatments / Results  Labs (all labs ordered are listed, but only abnormal results are displayed) Labs Reviewed  COMPREHENSIVE METABOLIC PANEL - Abnormal; Notable for the following components:      Result Value   CO2 20 (*)    Calcium 8.2 (*)    Albumin 2.7 (*)    AST 102 (*)    All other components within normal limits  CBC WITH DIFFERENTIAL/PLATELET - Abnormal; Notable for the following components:   WBC 2.9 (*)    Hemoglobin 10.8 (*)    HCT 34.3 (*)    RDW 15.9 (*)    Lymphs Abs 0.5 (*)    All other components within normal limits  URINALYSIS, ROUTINE W REFLEX MICROSCOPIC - Abnormal; Notable for the following components:   Hgb urine dipstick SMALL (*)    Protein, ur 100 (*)    Leukocytes,Ua MODERATE (*)    Bacteria, UA MANY (*)    All other components within normal limits  URINE CULTURE  PREGNANCY, URINE     EKG None  Radiology No results found.  Procedures Procedures (including critical care time)  Medications Ordered in ED Medications  sodium chloride 0.9 % bolus 1,000 mL (has no administration in time range)  morphine 4 MG/ML injection 4 mg (has no administration in time range)  sodium chloride 0.9 % bolus 1,000 mL (1,000 mLs Intravenous New Bag/Given (Non-Interop) 01/25/19 0832)  ondansetron (ZOFRAN) injection 4 mg (4 mg Intravenous Given 01/25/19 0830)  morphine 4 MG/ML injection 4 mg (4 mg Intravenous Given 01/25/19 0830)  dexamethasone (DECADRON) injection 10 mg (10 mg Intravenous Given 01/25/19 1323)  cefTRIAXone (ROCEPHIN) 1 g in sodium chloride 0.9 % 100 mL IVPB (0 g Intravenous Stopped 01/25/19 1421)     Initial Impression / Assessment and Plan / ED Course  I have reviewed the triage vital signs and the nursing notes.  Pertinent labs & imaging results that were available during my care of the patient were reviewed by me and considered in my medical decision making (see chart for details).        She is feeling better following medications and fluids here in the emergency department.  I did give her a dose of antibiotics and steroid.  The patient is advised to return here as needed.  I have advised her to follow-up with her primary doctor and rheumatologist.  The patient is advised to increase her fluid intake and rest as much as possible.  Final Clinical Impressions(s) / ED Diagnoses   Final diagnoses:  None    ED Discharge Orders    None       Dalia Heading, PA-C 01/25/19 1425    Dorie Rank, MD 01/27/19 1538

## 2019-01-25 NOTE — TOC Initial Note (Addendum)
Transition of Care [facility]) - Initial/Assessment Note    Patient Details  Name: [name] [name] MRN: [id number] Date of Birth: 09/09/1996  Transition of Care [facility]) CM/SW Contact:    Erenest Rasher, RN Phone Number: [phone] 01/25/2019, 2:59 PM  Clinical Narrative:   [facility] ED TOC CM -referral no insurance/medication assistance                Spoke to pt concerning meds and cost. Provided her with goodrx coupon and states she can $28 for her meds. States she uses goodrx for her meds.   Expected Discharge Plan: Home/Self Care Barriers to Discharge: No Barriers Identified   Patient Goals and CMS Choice        Expected Discharge Plan and Services Expected Discharge Plan: Home/Self Care   Discharge Planning Services: CM Consult, Medication Assistance                                          Prior Living Arrangements/Services     Patient language and need for interpreter reviewed:: Yes Do you feel safe going back to the place where you live?: Yes      Need for Family Participation in Patient Care: No (Comment) Care giver support system in place?: No (comment)   Criminal Activity/Legal Involvement Pertinent to Current Situation/Hospitalization: No - Comment as needed  Activities of Daily Living      Permission Sought/Granted Permission sought to share information with : Case Manager                Emotional Assessment Appearance:: Appears older than stated age Attitude/Demeanor/Rapport: Engaged Affect (typically observed): Accepting Orientation: : Oriented to Self, Oriented to Place, Oriented to  Time, Oriented to Situation   Psych Involvement: No (comment)  Admission diagnosis:  body pain Patient Active Problem List   Diagnosis Date Noted  . Relative acute adrenal insufficiency ([place]) 06/11/2018  . Anemia 06/09/2018  . Lobar pneumonia ([place]) 06/07/2018  . Pleuritic chest pain 06/05/2018  . Dysphagia 06/01/2018  . Odynophagia 06/01/2018   . Thrush 06/01/2018  . Mucositis oral 01/12/2018  . Vaginal candidiasis 01/12/2018  . Methotrexate toxicity   . Mouth ulcers   . Leukopenia 01/10/2018  . Accidental methotrexate overdose 01/08/2018  . Depression 02/08/2017  . Primary osteoarthritis of both knees 11/09/2016  . Chronic midline low back pain without sciatica 11/09/2016  . Vitamin D deficiency 11/09/2016  . SLE (systemic lupus erythematosus) ([place]) 09/19/2016  . Insomnia 01/29/2016  . Depo-Provera contraceptive status 08/11/2015  . Raynaud's disease 08/11/2015  . Seasonal allergies 08/11/2015  . IDA (iron deficiency anemia) 08/11/2015   PCP:  [name], FNP Pharmacy:   [id number] - [address], [place] [address] Phone: [phone] Fax: [phone]  [id number] - [place], [place] [address] [place] [address] Phone: [phone] Fax: [phone]     Social Determinants of Health (SDOH) Interventions    Readmission Risk Interventions No flowsheet data found.

## 2019-01-25 NOTE — ED Triage Notes (Signed)
Pt c/o body pains for a couple days. Reports had diarrhea for 2 weeks.

## 2019-01-27 LAB — URINE CULTURE: Culture: 10000 — AB

## 2019-02-01 DIAGNOSIS — Z3202 Encounter for pregnancy test, result negative: Secondary | ICD-10-CM | POA: Diagnosis not present

## 2019-02-01 DIAGNOSIS — M79602 Pain in left arm: Secondary | ICD-10-CM | POA: Diagnosis not present

## 2019-02-01 DIAGNOSIS — M5412 Radiculopathy, cervical region: Secondary | ICD-10-CM | POA: Diagnosis not present

## 2019-02-01 DIAGNOSIS — M329 Systemic lupus erythematosus, unspecified: Secondary | ICD-10-CM | POA: Diagnosis not present

## 2019-02-01 DIAGNOSIS — R079 Chest pain, unspecified: Secondary | ICD-10-CM | POA: Diagnosis not present

## 2019-02-02 DIAGNOSIS — R9431 Abnormal electrocardiogram [ECG] [EKG]: Secondary | ICD-10-CM | POA: Diagnosis not present

## 2019-02-05 ENCOUNTER — Telehealth: Payer: Self-pay

## 2019-02-05 NOTE — Telephone Encounter (Signed)
  Called and spoke with patient for COVID 19 Screening. Patient had no risk factors and is cleared to come into office for appointment. Thanks! 

## 2019-02-06 ENCOUNTER — Encounter (HOSPITAL_COMMUNITY): Payer: Self-pay

## 2019-02-06 ENCOUNTER — Encounter: Payer: Self-pay | Admitting: Family Medicine

## 2019-02-06 ENCOUNTER — Ambulatory Visit (INDEPENDENT_AMBULATORY_CARE_PROVIDER_SITE_OTHER): Payer: BLUE CROSS/BLUE SHIELD | Admitting: Family Medicine

## 2019-02-06 ENCOUNTER — Other Ambulatory Visit: Payer: Self-pay

## 2019-02-06 VITALS — BP 145/96 | HR 120 | Temp 99.4°F | Resp 18 | Ht 63.0 in | Wt 120.0 lb

## 2019-02-06 DIAGNOSIS — I7300 Raynaud's syndrome without gangrene: Secondary | ICD-10-CM

## 2019-02-06 DIAGNOSIS — F321 Major depressive disorder, single episode, moderate: Secondary | ICD-10-CM | POA: Diagnosis not present

## 2019-02-06 DIAGNOSIS — I10 Essential (primary) hypertension: Secondary | ICD-10-CM | POA: Diagnosis not present

## 2019-02-06 DIAGNOSIS — M5412 Radiculopathy, cervical region: Secondary | ICD-10-CM

## 2019-02-06 DIAGNOSIS — M329 Systemic lupus erythematosus, unspecified: Secondary | ICD-10-CM

## 2019-02-06 MED ORDER — AMLODIPINE BESYLATE 5 MG PO TABS: 7.5000 mg | ORAL_TABLET | Freq: Every day | ORAL | 3 refills | Status: DC

## 2019-02-06 MED ORDER — DULOXETINE HCL 30 MG PO CPEP: 60.0000 mg | ORAL_CAPSULE | Freq: Every day | ORAL | 1 refills | Status: DC

## 2019-02-06 MED ORDER — CYCLOBENZAPRINE HCL 10 MG PO TABS: 10.0000 mg | ORAL_TABLET | Freq: Three times a day (TID) | ORAL | 0 refills | Status: DC | PRN

## 2019-02-06 NOTE — Progress Notes (Signed)
Patient [facility]   Progress Note: General Provider: [name], FNP  SUBJECTIVE:   [name] [name] is a 23 y.o. female who  has a past medical history of Gout, Hypertension, Lupus ([place]), and Raynaud disease.. Patient presents today for Follow-up (pinched nerve left shoulder was seen in ER ) and Insomnia  Patient presents today with her parents. She has given permission to discuss her medical concerns in their presence.  Patient seen in the ED on 02/01/2019 and diagnosed with lupus flare and cervical radiculopathy. She was prescribed a 12 day course of prednisone and norco. She was seen at [facility] yesterday. Notes scanned.  Her parents would like to have her rheumatologic care transferred to [facility] and are requesting a referral.  [name] [name] has been isolating, not sleeping, crying and having periods of sadness for the past month. She denies a history of depression and has never been on medications for this. She and her parents feel that she needs help and are requesting a referral to psychology. Today, she denies psychosis, delusional thinking, suicidal/homocidal ideations, intent or plan.   Review of Systems  Constitutional: Positive for malaise/fatigue.  HENT: Negative.   Eyes: Negative.   Respiratory: Negative.   Cardiovascular: Negative.   Gastrointestinal: Negative.   Genitourinary: Negative.   Musculoskeletal: Positive for back pain.  Skin: Negative.   Neurological: Negative.   Psychiatric/Behavioral: Positive for depression. Negative for hallucinations, substance abuse and suicidal ideas. The patient has insomnia. The patient is not nervous/anxious.      OBJECTIVE: BP (!) 145/96 (BP Location: Right Arm, Patient Position: Sitting, Cuff Size: Normal)   Pulse (!) 120   Temp 99.4 F (37.4 C) (Oral)   Resp 18   Ht 5\' 3"  (1.6 m)   Wt 120 lb (54.4 kg)   SpO2 99%   BMI 21.26 kg/m   Wt Readings from Last 3 Encounters:   02/06/19 120 lb (54.4 kg)  09/28/18 138 lb (62.6 kg)  08/30/18 138 lb (62.6 kg)     Physical Exam Vitals signs and nursing note reviewed.  Constitutional:      General: She is not in acute distress.    Appearance: Normal appearance. She is ill-appearing (Tired appearance, weaker than normal).  HENT:     Head: Normocephalic and atraumatic.  Eyes:     Extraocular Movements: Extraocular movements intact.     Conjunctiva/sclera: Conjunctivae normal.     Pupils: Pupils are equal, round, and reactive to light.  Cardiovascular:     Rate and Rhythm: Normal rate and regular rhythm.     Heart sounds: No murmur.  Pulmonary:     Effort: Pulmonary effort is normal.     Breath sounds: Normal breath sounds.  Musculoskeletal:     Right shoulder: She exhibits normal range of motion and no tenderness.     Thoracic back: She exhibits tenderness and spasm.       Back:       Arms:  Skin:    General: Skin is warm and dry.  Neurological:     Mental Status: She is alert and oriented to person, place, and time.     Motor: Weakness present.     Gait: Gait abnormal (patient is in wheelchair. ).  Psychiatric:        Attention and Perception: Attention and perception normal.        Mood and Affect: Affect normal. Mood is depressed.  Speech: Speech normal.        Behavior: Behavior normal.        Thought Content: Thought content normal.        Judgment: Judgment normal.     ASSESSMENT/PLAN:  1. Essential hypertension - amLODipine (NORVASC) 5 MG tablet; Take 1.5 tablets (7.5 mg total) by mouth daily.  Dispense: 135 tablet; Refill: 3  2. Cervical radiculopathy - cyclobenzaprine (FLEXERIL) 10 MG tablet; Take 1 tablet (10 mg total) by mouth 3 (three) times daily as needed for up to 7 days for muscle spasms.  Dispense: 21 tablet; Refill: 0  3. Current moderate episode of major depressive disorder without prior episode (HCC) - DULoxetine (CYMBALTA) 30 MG capsule; Take 2 capsules (60 mg  total) by mouth daily. Take 1 cap po qd  x 2 weeks then increase to 60 mg po qd thereafter  Dispense: 60 capsule; Refill: 1 - Ambulatory referral to Psychology  4. Raynaud's disease without gangrene - Ambulatory referral to Rheumatology  5. Systemic lupus erythematosus, unspecified SLE type, unspecified organ involvement status ([place]) Referred to [facility] for worsening symptoms of Lupus.  - Ambulatory referral to Rheumatology     Return in about 4 weeks (around 03/06/2019) for Starting Cymbalta.    The patient was given clear instructions to go to ER or return to medical center if symptoms do not improve, worsen or new problems develop. The patient verbalized understanding and agreed with plan of care.   [name], FNP-BC Patient [facility] [address] [place],  [address] [phone]

## 2019-02-06 NOTE — Patient Instructions (Addendum)
I am starting you on a new medication in the SSRI/SNRI family for your depression/anxiety. Take this medication daily as it has been prescribed. You may experience gastrointestinal upset. This usually will stop after you are on the medication for a few days. If you have feelings of euphoria (happy and high), stop the medication immediately and go to the nearest emergency department. If you have suicidal or homicidal thoughts, delusions or hallucinations, stop the medication immediately and go to the nearest emergency department. I would like to see you again in 4 weeks.    I placed a referral to [facility] for rheumatology. I also placed a referral to psychology for therapy.  They will call you for an appointment.   This practice has both psychiatrists and counselors.  [facility] Address: [address], [place], [place] [address] Phone: [phone] Hours: Open  Closes 5:30 PM    Cervical Radiculopathy  Cervical radiculopathy means that a nerve in the neck is pinched or bruised. This can cause pain or loss of feeling (numbness) that runs from your neck to your arm and fingers. Follow these instructions at home: Managing pain  Take over-the-counter and prescription medicines only as told by your doctor.  If directed, put ice on the injured or painful area. ? Put ice in a plastic bag. ? Place a towel between your skin and the bag. ? Leave the ice on for 20 minutes, 2-3 times per day.  If ice does not help, you can try using heat. Take a warm shower or warm bath, or use a heat pack as told by your doctor.  You may try a gentle neck and shoulder massage. Activity  Rest as needed. Follow instructions from your doctor about any activities to avoid.  Do exercises as told by your doctor or physical therapist. General instructions  If you were given a soft collar, wear it as told by your doctor.  Use a flat pillow when you sleep.  Keep all follow-up visits as told by your  doctor. This is important. Contact a doctor if:  Your condition does not improve with treatment. Get help right away if:  Your pain gets worse and is not controlled with medicine.  You lose feeling or feel weak in your hand, arm, face, or leg.  You have a fever.  You have a stiff neck.  You cannot control when you poop or pee (have incontinence).  You have trouble with walking, balance, or talking. This information is not intended to replace advice given to you by your health care provider. Make sure you discuss any questions you have with your health care provider. Document Released: 08/18/2011 Document Revised: 02/04/2016 Document Reviewed: 10/23/2014 Elsevier Interactive Patient Education  2019 Elsevier Inc.   Duloxetine delayed-release capsules What is this medicine? DULOXETINE (doo LOX e teen) is used to treat depression, anxiety, and different types of chronic pain. This medicine may be used for other purposes; ask your health care provider or pharmacist if you have questions. COMMON BRAND NAME(S): Cymbalta, [place]ren[place] What should I tell my health care provider before I take this medicine? They need to know if you have any of these conditions: -bipolar disorder or a family history of bipolar disorder -glaucoma -kidney disease -liver disease -suicidal thoughts or a previous suicide attempt -taken medicines called MAOIs like Carbex, Eldepryl, Marplan, Nardil, and Parnate within 14 days -an unusual reaction to duloxetine, other medicines, foods, dyes, or preservatives -pregnant or trying to get pregnant -breast-feeding How should  I use this medicine? Take this medicine by mouth with a glass of water. Follow the directions on the prescription label. Do not cut, crush or chew this medicine. You can take this medicine with or without food. Take your medicine at regular intervals. Do not take your medicine more often than directed. Do not stop taking this medicine suddenly except  upon the advice of your doctor. Stopping this medicine too quickly may cause serious side effects or your condition may worsen. A special MedGuide will be given to you by the pharmacist with each prescription and refill. Be sure to read this information carefully each time. Talk to your pediatrician regarding the use of this medicine in children. While this drug may be prescribed for children as young as 26 years of age for selected conditions, precautions do apply. Overdosage: If you think you have taken too much of this medicine contact a poison control center or emergency room at once. NOTE: This medicine is only for you. Do not share this medicine with others. What if I miss a dose? If you miss a dose, take it as soon as you can. If it is almost time for your next dose, take only that dose. Do not take double or extra doses. What may interact with this medicine? Do not take this medicine with any of the following medications: -desvenlafaxine -levomilnacipran -linezolid -MAOIs like Carbex, Eldepryl, Marplan, Nardil, and Parnate -methylene blue (injected into a vein) -milnacipran -thioridazine -venlafaxine This medicine may also interact with the following medications: -alcohol -amphetamines -aspirin and aspirin-like medicines -certain antibiotics like ciprofloxacin and enoxacin -certain medicines for blood pressure, heart disease, irregular heart beat -certain medicines for depression, anxiety, or psychotic disturbances -certain medicines for migraine headache like almotriptan, eletriptan, frovatriptan, naratriptan, rizatriptan, sumatriptan, zolmitriptan -certain medicines that treat or prevent blood clots like warfarin, enoxaparin, and dalteparin -cimetidine -fentanyl -lithium -NSAIDS, medicines for pain and inflammation, like ibuprofen or naproxen -phentermine -procarbazine -rasagiline -sibutramine -St. John's wort -theophylline -tramadol -tryptophan This list may not  describe all possible interactions. Give your health care provider a list of all the medicines, herbs, non-prescription drugs, or dietary supplements you use. Also tell them if you smoke, drink alcohol, or use illegal drugs. Some items may interact with your medicine. What should I watch for while using this medicine? Tell your doctor if your symptoms do not get better or if they get worse. Visit your doctor or health care professional for regular checks on your progress. Because it may take several weeks to see the full effects of this medicine, it is important to continue your treatment as prescribed by your doctor. Patients and their families should watch out for new or worsening thoughts of suicide or depression. Also watch out for sudden changes in feelings such as feeling anxious, agitated, panicky, irritable, hostile, aggressive, impulsive, severely restless, overly excited and hyperactive, or not being able to sleep. If this happens, especially at the beginning of treatment or after a change in dose, call your health care professional. Dennis Bast may get drowsy or dizzy. Do not drive, use machinery, or do anything that needs mental alertness until you know how this medicine affects you. Do not stand or sit up quickly, especially if you are an older patient. This reduces the risk of dizzy or fainting spells. Alcohol may interfere with the effect of this medicine. Avoid alcoholic drinks. This medicine can cause an increase in blood pressure. This medicine can also cause a sudden drop in your blood pressure, which may  make you feel faint and increase the chance of a fall. These effects are most common when you first start the medicine or when the dose is increased, or during use of other medicines that can cause a sudden drop in blood pressure. Check with your doctor for instructions on monitoring your blood pressure while taking this medicine. Your mouth may get dry. Chewing sugarless gum or sucking hard candy,  and drinking plenty of water may help. Contact your doctor if the problem does not go away or is severe. What side effects may I notice from receiving this medicine? Side effects that you should report to your doctor or health care professional as soon as possible: -allergic reactions like skin rash, itching or hives, swelling of the face, lips, or tongue -anxious -breathing problems -confusion -changes in vision -chest pain -confusion -elevated mood, decreased need for sleep, racing thoughts, impulsive behavior -eye pain -fast, irregular heartbeat -feeling faint or lightheaded, falls -feeling agitated, angry, or irritable -hallucination, loss of contact with reality -high blood pressure -loss of balance or coordination -palpitations -redness, blistering, peeling or loosening of the skin, including inside the mouth -restlessness, pacing, inability to keep still -seizures -stiff muscles -suicidal thoughts or other mood changes -trouble passing urine or change in the amount of urine -trouble sleeping -unusual bleeding or bruising -unusually weak or tired -vomiting -yellowing of the eyes or skin Side effects that usually do not require medical attention (report to your doctor or health care professional if they continue or are bothersome): -change in sex drive or performance -change in appetite or weight -constipation -dizziness -dry mouth -headache -increased sweating -nausea -tired This list may not describe all possible side effects. Call your doctor for medical advice about side effects. You may report side effects to FDA at 1-800-FDA-1088. Where should I keep my medicine? Keep out of the reach of children. Store at room temperature between 20 and 25 degrees C (68 to 77 degrees F). Throw away any unused medicine after the expiration date. NOTE: This sheet is a summary. It may not cover all possible information. If you have questions about this medicine, talk to your  doctor, pharmacist, or health care provider.  2019 Elsevier/Gold Standard (2016-01-28 18:16:03)

## 2019-02-21 ENCOUNTER — Other Ambulatory Visit: Payer: Self-pay | Admitting: Family Medicine

## 2019-02-21 DIAGNOSIS — M5412 Radiculopathy, cervical region: Secondary | ICD-10-CM

## 2019-02-21 NOTE — Telephone Encounter (Signed)
[name] [name],  Please advise refill on flexeril. Thanks!

## 2019-02-27 ENCOUNTER — Telehealth: Payer: Self-pay

## 2019-02-27 NOTE — Telephone Encounter (Signed)
Called to do COVID Screening for appointment tomorrow. No answer. Left a message to call back. Thanks! 

## 2019-02-28 ENCOUNTER — Ambulatory Visit: Payer: BLUE CROSS/BLUE SHIELD | Admitting: Family Medicine

## 2019-02-28 ENCOUNTER — Ambulatory Visit: Payer: Self-pay | Admitting: Licensed Clinical Social Worker

## 2019-03-19 ENCOUNTER — Emergency Department (HOSPITAL_COMMUNITY): Payer: BC Managed Care – PPO

## 2019-03-19 ENCOUNTER — Other Ambulatory Visit: Payer: Self-pay

## 2019-03-19 ENCOUNTER — Encounter (HOSPITAL_COMMUNITY): Payer: Self-pay

## 2019-03-19 ENCOUNTER — Inpatient Hospital Stay (HOSPITAL_COMMUNITY)
Admission: EM | Admit: 2019-03-19 | Discharge: 2019-03-25 | DRG: 546 | Disposition: A | Discharge status: Home / Self Care | Payer: BC Managed Care – PPO | Attending: Internal Medicine | Admitting: Internal Medicine

## 2019-03-19 DIAGNOSIS — Z6822 Body mass index (BMI) 22.0-22.9, adult: Secondary | ICD-10-CM

## 2019-03-19 DIAGNOSIS — E860 Dehydration: Secondary | ICD-10-CM | POA: Diagnosis not present

## 2019-03-19 DIAGNOSIS — F329 Major depressive disorder, single episode, unspecified: Secondary | ICD-10-CM | POA: Diagnosis present

## 2019-03-19 DIAGNOSIS — R531 Weakness: Secondary | ICD-10-CM

## 2019-03-19 DIAGNOSIS — E872 Acidosis, unspecified: Secondary | ICD-10-CM

## 2019-03-19 DIAGNOSIS — M3214 Glomerular disease in systemic lupus erythematosus: Secondary | ICD-10-CM | POA: Diagnosis not present

## 2019-03-19 DIAGNOSIS — R600 Localized edema: Secondary | ICD-10-CM | POA: Diagnosis present

## 2019-03-19 DIAGNOSIS — Z793 Long term (current) use of hormonal contraceptives: Secondary | ICD-10-CM | POA: Diagnosis not present

## 2019-03-19 DIAGNOSIS — J209 Acute bronchitis, unspecified: Secondary | ICD-10-CM | POA: Diagnosis present

## 2019-03-19 DIAGNOSIS — Z7952 Long term (current) use of systemic steroids: Secondary | ICD-10-CM

## 2019-03-19 DIAGNOSIS — R740 Nonspecific elevation of levels of transaminase and lactic acid dehydrogenase [LDH]: Secondary | ICD-10-CM | POA: Diagnosis present

## 2019-03-19 DIAGNOSIS — R0602 Shortness of breath: Secondary | ICD-10-CM | POA: Diagnosis not present

## 2019-03-19 DIAGNOSIS — R197 Diarrhea, unspecified: Secondary | ICD-10-CM | POA: Diagnosis present

## 2019-03-19 DIAGNOSIS — F3289 Other specified depressive episodes: Secondary | ICD-10-CM | POA: Diagnosis not present

## 2019-03-19 DIAGNOSIS — D509 Iron deficiency anemia, unspecified: Secondary | ICD-10-CM | POA: Diagnosis present

## 2019-03-19 DIAGNOSIS — M109 Gout, unspecified: Secondary | ICD-10-CM | POA: Diagnosis not present

## 2019-03-19 DIAGNOSIS — R000 Tachycardia, unspecified: Secondary | ICD-10-CM

## 2019-03-19 DIAGNOSIS — R339 Retention of urine, unspecified: Secondary | ICD-10-CM | POA: Diagnosis present

## 2019-03-19 DIAGNOSIS — E440 Moderate protein-calorie malnutrition: Secondary | ICD-10-CM | POA: Diagnosis present

## 2019-03-19 DIAGNOSIS — F0631 Mood disorder due to known physiological condition with depressive features: Secondary | ICD-10-CM

## 2019-03-19 DIAGNOSIS — I471 Supraventricular tachycardia: Secondary | ICD-10-CM | POA: Diagnosis present

## 2019-03-19 DIAGNOSIS — B370 Candidal stomatitis: Secondary | ICD-10-CM | POA: Diagnosis present

## 2019-03-19 DIAGNOSIS — R1310 Dysphagia, unspecified: Secondary | ICD-10-CM

## 2019-03-19 DIAGNOSIS — I7300 Raynaud's syndrome without gangrene: Secondary | ICD-10-CM | POA: Diagnosis not present

## 2019-03-19 DIAGNOSIS — J302 Other seasonal allergic rhinitis: Secondary | ICD-10-CM | POA: Diagnosis not present

## 2019-03-19 DIAGNOSIS — R7401 Elevation of levels of liver transaminase levels: Secondary | ICD-10-CM

## 2019-03-19 DIAGNOSIS — R609 Edema, unspecified: Secondary | ICD-10-CM | POA: Diagnosis not present

## 2019-03-19 DIAGNOSIS — F321 Major depressive disorder, single episode, moderate: Secondary | ICD-10-CM

## 2019-03-19 DIAGNOSIS — Z823 Family history of stroke: Secondary | ICD-10-CM | POA: Diagnosis not present

## 2019-03-19 DIAGNOSIS — F32A Depression, unspecified: Secondary | ICD-10-CM | POA: Diagnosis present

## 2019-03-19 DIAGNOSIS — R0600 Dyspnea, unspecified: Secondary | ICD-10-CM

## 2019-03-19 DIAGNOSIS — M329 Systemic lupus erythematosus, unspecified: Secondary | ICD-10-CM | POA: Diagnosis present

## 2019-03-19 DIAGNOSIS — Z20828 Contact with and (suspected) exposure to other viral communicable diseases: Secondary | ICD-10-CM | POA: Diagnosis present

## 2019-03-19 DIAGNOSIS — B349 Viral infection, unspecified: Secondary | ICD-10-CM

## 2019-03-19 DIAGNOSIS — I10 Essential (primary) hypertension: Secondary | ICD-10-CM | POA: Diagnosis not present

## 2019-03-19 DIAGNOSIS — J40 Bronchitis, not specified as acute or chronic: Secondary | ICD-10-CM

## 2019-03-19 HISTORY — DX: Tachycardia, unspecified: R00.0

## 2019-03-19 HISTORY — DX: Bronchitis, not specified as acute or chronic: J40

## 2019-03-19 HISTORY — DX: Systemic lupus erythematosus, unspecified: M32.9

## 2019-03-19 HISTORY — DX: Essential (primary) hypertension: I10

## 2019-03-19 HISTORY — DX: Elevation of levels of liver transaminase levels: R74.01

## 2019-03-19 HISTORY — DX: Acidosis, unspecified: E87.20

## 2019-03-19 HISTORY — DX: Candidal stomatitis: B37.0

## 2019-03-19 HISTORY — DX: Acidosis: E87.2

## 2019-03-19 HISTORY — DX: Dehydration: E86.0

## 2019-03-19 LAB — COMPREHENSIVE METABOLIC PANEL
ALT: 31 U/L (ref 0–44)
AST: 68 U/L — ABNORMAL HIGH (ref 15–41)
Albumin: 2.7 g/dL — ABNORMAL LOW (ref 3.5–5.0)
Alkaline Phosphatase: 120 U/L (ref 38–126)
Anion gap: 11 (ref 5–15)
BUN: 12 mg/dL (ref 6–20)
CO2: 19 mmol/L — ABNORMAL LOW (ref 22–32)
Calcium: 8.5 mg/dL — ABNORMAL LOW (ref 8.9–10.3)
Chloride: 105 mmol/L (ref 98–111)
Creatinine, Ser: 0.71 mg/dL (ref 0.44–1.00)
GFR calc Af Amer: >60 mL/min (ref >60)
GFR calc non Af Amer: >60 mL/min (ref >60)
Glucose, Bld: 94 mg/dL (ref 70–99)
Potassium: 4.3 mmol/L (ref 3.5–5.1)
Sodium: 135 mmol/L (ref 135–145)
Total Bilirubin: 0.3 mg/dL (ref 0.3–1.2)
Total Protein: 7.6 g/dL (ref 6.5–8.1)

## 2019-03-19 LAB — CBC WITH DIFFERENTIAL/PLATELET
Abs Immature Granulocytes: 0.06 10*3/uL (ref 0.00–0.07)
Basophils Absolute: 0 10*3/uL (ref 0.0–0.1)
Basophils Relative: 0 %
Eosinophils Absolute: 0 10*3/uL (ref 0.0–0.5)
Eosinophils Relative: 0 %
HCT: 36 % (ref 36.0–46.0)
Hemoglobin: 11.4 g/dL — ABNORMAL LOW (ref 12.0–15.0)
Immature Granulocytes: 1 %
Lymphocytes Relative: 11 %
Lymphs Abs: 0.5 10*3/uL — ABNORMAL LOW (ref 0.7–4.0)
MCH: 28.8 pg (ref 26.0–34.0)
MCHC: 31.7 g/dL (ref 30.0–36.0)
MCV: 90.9 fL (ref 80.0–100.0)
Monocytes Absolute: 0.2 10*3/uL (ref 0.1–1.0)
Monocytes Relative: 3 %
Neutro Abs: 4.1 10*3/uL (ref 1.7–7.7)
Neutrophils Relative %: 85 %
Platelets: 157 10*3/uL (ref 150–400)
RBC: 3.96 MIL/uL (ref 3.87–5.11)
RDW: 15.3 % (ref 11.5–15.5)
WBC: 4.9 10*3/uL (ref 4.0–10.5)
nRBC: 0 % (ref 0.0–0.2)

## 2019-03-19 LAB — I-STAT BETA HCG BLOOD, ED (MC, WL, AP ONLY): I-stat hCG, quantitative: <5 m[IU]/mL (ref <5)

## 2019-03-19 LAB — LACTIC ACID, PLASMA: Lactic Acid, Venous: 1.6 mmol/L (ref 0.5–1.9)

## 2019-03-19 LAB — SARS CORONAVIRUS 2 BY RT PCR (HOSPITAL ORDER, PERFORMED IN ~~LOC~~ HOSPITAL LAB): SARS Coronavirus 2: NEGATIVE

## 2019-03-19 LAB — TSH: TSH: 3.623 u[IU]/mL (ref 0.350–4.500)

## 2019-03-19 MED ORDER — HYDROMORPHONE HCL 1 MG/ML IJ SOLN
1.0000 mg | Freq: Once | INTRAMUSCULAR | Status: AC
  Administered 2019-03-19: 1 mg via INTRAVENOUS
  Filled 2019-03-19: qty 1

## 2019-03-19 MED ORDER — DULOXETINE HCL 60 MG PO CPEP
60.0000 mg | ORAL_CAPSULE | Freq: Every day | ORAL | Status: DC
  Administered 2019-03-19 – 2019-03-25 (×6): 60 mg via ORAL
  Filled 2019-03-19 (×6): qty 1

## 2019-03-19 MED ORDER — ONDANSETRON HCL 4 MG/2ML IJ SOLN
4.0000 mg | Freq: Once | INTRAMUSCULAR | Status: AC
  Administered 2019-03-19: 09:00:00 4 mg via INTRAVENOUS
  Filled 2019-03-19: qty 2

## 2019-03-19 MED ORDER — SODIUM CHLORIDE 0.9 % IV SOLN
INTRAVENOUS | Status: DC
  Administered 2019-03-19 – 2019-03-20 (×2): via INTRAVENOUS

## 2019-03-19 MED ORDER — FLUCONAZOLE 150 MG PO TABS
150.0000 mg | ORAL_TABLET | Freq: Once | ORAL | Status: AC
  Administered 2019-03-19: 150 mg via ORAL
  Filled 2019-03-19: qty 1

## 2019-03-19 MED ORDER — FOLIC ACID 1 MG PO TABS
1.0000 mg | ORAL_TABLET | Freq: Every day | ORAL | Status: DC
  Administered 2019-03-19 – 2019-03-24 (×5): 1 mg via ORAL
  Filled 2019-03-19 (×5): qty 1

## 2019-03-19 MED ORDER — NYSTATIN 100000 UNIT/ML MT SUSP
5.0000 mL | Freq: Four times a day (QID) | OROMUCOSAL | Status: DC
  Administered 2019-03-19 – 2019-03-25 (×17): 500000 [IU] via ORAL
  Filled 2019-03-19 (×17): qty 5

## 2019-03-19 MED ORDER — ONDANSETRON HCL 4 MG/2ML IJ SOLN
4.0000 mg | Freq: Four times a day (QID) | INTRAMUSCULAR | Status: DC | PRN
  Administered 2019-03-22 – 2019-03-24 (×5): 4 mg via INTRAVENOUS
  Filled 2019-03-19 (×6): qty 2

## 2019-03-19 MED ORDER — ACETAMINOPHEN 650 MG RE SUPP: 650.0000 mg | Freq: Four times a day (QID) | RECTAL | Status: DC | PRN

## 2019-03-19 MED ORDER — ACETAMINOPHEN 325 MG PO TABS: 650.0000 mg | ORAL_TABLET | Freq: Once | ORAL | Status: DC

## 2019-03-19 MED ORDER — GUAIFENESIN ER 600 MG PO TB12
1200.0000 mg | ORAL_TABLET | Freq: Two times a day (BID) | ORAL | Status: DC
  Administered 2019-03-19 – 2019-03-24 (×8): 1200 mg via ORAL
  Filled 2019-03-19 (×10): qty 2

## 2019-03-19 MED ORDER — ACETAMINOPHEN 325 MG PO TABS
650.0000 mg | ORAL_TABLET | Freq: Four times a day (QID) | ORAL | Status: DC | PRN
  Administered 2019-03-23: 650 mg via ORAL
  Filled 2019-03-19: qty 2

## 2019-03-19 MED ORDER — ONDANSETRON HCL 4 MG PO TABS: 4.0000 mg | ORAL_TABLET | Freq: Four times a day (QID) | ORAL | Status: DC | PRN

## 2019-03-19 MED ORDER — HYDROCODONE-ACETAMINOPHEN 5-325 MG PO TABS
1.0000 | ORAL_TABLET | Freq: Four times a day (QID) | ORAL | Status: DC | PRN
  Administered 2019-03-19: 1 via ORAL
  Filled 2019-03-19: qty 1

## 2019-03-19 MED ORDER — VITAMIN D (ERGOCALCIFEROL) 1.25 MG (50000 UNIT) PO CAPS
50000.0000 [IU] | ORAL_CAPSULE | ORAL | Status: DC
  Administered 2019-03-19 – 2019-03-21 (×2): 50000 [IU] via ORAL
  Filled 2019-03-19 (×2): qty 1

## 2019-03-19 MED ORDER — CYCLOBENZAPRINE HCL 10 MG PO TABS
10.0000 mg | ORAL_TABLET | Freq: Three times a day (TID) | ORAL | Status: DC | PRN
  Administered 2019-03-19: 10 mg via ORAL
  Filled 2019-03-19: qty 1

## 2019-03-19 MED ORDER — BISACODYL 10 MG RE SUPP: 10.0000 mg | Freq: Every day | RECTAL | Status: DC | PRN

## 2019-03-19 MED ORDER — SODIUM CHLORIDE 0.9 % IV BOLUS
1000.0000 mL | Freq: Once | INTRAVENOUS | Status: AC
  Administered 2019-03-19: 11:00:00 1000 mL via INTRAVENOUS

## 2019-03-19 MED ORDER — VITAMIN D 25 MCG (1000 UNIT) PO TABS
1000.0000 [IU] | ORAL_TABLET | Freq: Every day | ORAL | Status: DC
  Administered 2019-03-20 – 2019-03-25 (×5): 1000 [IU] via ORAL
  Filled 2019-03-19 (×7): qty 1

## 2019-03-19 MED ORDER — FOLIC ACID 1 MG PO TABS
2.0000 mg | ORAL_TABLET | Freq: Every day | ORAL | Status: DC
  Administered 2019-03-20 – 2019-03-25 (×5): 2 mg via ORAL
  Filled 2019-03-19 (×5): qty 2

## 2019-03-19 MED ORDER — AMOXICILLIN-POT CLAVULANATE 875-125 MG PO TABS
1.0000 | ORAL_TABLET | Freq: Two times a day (BID) | ORAL | Status: DC
  Administered 2019-03-19 – 2019-03-22 (×6): 1 via ORAL
  Filled 2019-03-19 (×7): qty 1

## 2019-03-19 MED ORDER — SODIUM CHLORIDE 0.9 % IV BOLUS
1000.0000 mL | Freq: Once | INTRAVENOUS | Status: AC
  Administered 2019-03-19: 09:00:00 1000 mL via INTRAVENOUS

## 2019-03-19 MED ORDER — HYDROCODONE-ACETAMINOPHEN 5-325 MG PO TABS
1.0000 | ORAL_TABLET | Freq: Four times a day (QID) | ORAL | Status: DC | PRN
  Administered 2019-03-20 (×3): 2 via ORAL
  Filled 2019-03-19 (×3): qty 2

## 2019-03-19 MED ORDER — PREDNISONE 20 MG PO TABS
20.0000 mg | ORAL_TABLET | Freq: Every day | ORAL | Status: DC
  Administered 2019-03-20: 20 mg via ORAL
  Filled 2019-03-19 (×2): qty 1

## 2019-03-19 MED ORDER — AMLODIPINE BESYLATE 5 MG PO TABS
7.5000 mg | ORAL_TABLET | Freq: Every day | ORAL | Status: DC
  Administered 2019-03-19 – 2019-03-20 (×2): 7.5 mg via ORAL
  Filled 2019-03-19 (×2): qty 2

## 2019-03-19 MED ORDER — ENOXAPARIN SODIUM 40 MG/0.4ML ~~LOC~~ SOLN
40.0000 mg | SUBCUTANEOUS | Status: DC
  Administered 2019-03-19 – 2019-03-25 (×4): 40 mg via SUBCUTANEOUS
  Filled 2019-03-19 (×5): qty 0.4

## 2019-03-19 MED ORDER — SENNOSIDES-DOCUSATE SODIUM 8.6-50 MG PO TABS: 1.0000 | ORAL_TABLET | Freq: Every evening | ORAL | Status: DC | PRN

## 2019-03-19 MED ORDER — ALBUTEROL SULFATE (2.5 MG/3ML) 0.083% IN NEBU: 2.5000 mg | INHALATION_SOLUTION | RESPIRATORY_TRACT | Status: DC | PRN

## 2019-03-19 MED ORDER — FERROUS SULFATE 325 (65 FE) MG PO TABS
325.0000 mg | ORAL_TABLET | Freq: Two times a day (BID) | ORAL | Status: DC
  Administered 2019-03-19 – 2019-03-25 (×9): 325 mg via ORAL
  Filled 2019-03-19 (×10): qty 1

## 2019-03-19 NOTE — ED Provider Notes (Signed)
[facility] DEPT Provider Note   CSN: [id number] Arrival date & time: 03/19/19  0740    History   Chief Complaint Chief Complaint  Patient presents with   Shortness of Breath   generalized edema   Generalized Body Aches    HPI [name] [name] is a 23 y.o. female with a PMHx of SLE on Tacrolimus, HTN, Raynaud's who presents to the ED with c/o cough and SOB.   [name] [name] states her symptoms began 3 days ago with decreased appetite and nausea. She denies vomiting. She also noticed swelling in her upper extremities, that is typical for her during a Lupus flare. Denies and rash development or lesions. She confirms she could still move her extremities, but with significant pain. The swelling occurred in her bilateral lower extremities as well.   The next day, she began to develop a productive cough with green phlegm. She endorses SOB and rhinorrhea began the same day, as well as anterior chest pain when coughing. She denies fever, chills, sore throat, abdominal pain, diarrhea, constipation. [name] [name] states she can still tolerate PO and has been trying to hydrate with Pedialyte.   She also notes dysuria, hematuria and increased frequency in the past couple days.   The history is provided by the patient.    Past Medical History:  Diagnosis Date   Gout    Hypertension    Lupus ([place])    Raynaud disease     Patient Active Problem List   Diagnosis Date Noted   Lupus (systemic lupus erythematosus) ([place]) 03/19/2019   Relative acute adrenal insufficiency ([place]) 06/11/2018   Anemia 06/09/2018   Lobar pneumonia ([place]) 06/07/2018   Pleuritic chest pain 06/05/2018   Dysphagia 06/01/2018   Odynophagia 06/01/2018   Thrush 06/01/2018   Mucositis oral 01/12/2018   Vaginal candidiasis 01/12/2018   Methotrexate toxicity    Mouth ulcers    Leukopenia 01/10/2018   Accidental methotrexate overdose 01/08/2018   Depression 02/08/2017   Primary  osteoarthritis of both knees 11/09/2016   Chronic midline low back pain without sciatica 11/09/2016   Vitamin D deficiency 11/09/2016   SLE (systemic lupus erythematosus) ([place]) 09/19/2016   Insomnia 01/29/2016   Depo-Provera contraceptive status 08/11/2015   Raynaud's disease 08/11/2015   Seasonal allergies 08/11/2015   IDA (iron deficiency anemia) 08/11/2015    Past Surgical History:  Procedure Laterality Date   ORIF SHOULDER FRACTURE Left    TONSILLECTOMY       OB History   No obstetric history on file.      Home Medications    Prior to Admission medications   Medication Sig Start Date End Date Taking? Authorizing Provider  amLODipine (NORVASC) 5 MG tablet Take 1.5 tablets (7.5 mg total) by mouth daily. 02/06/19 02/06/20 Yes [name], FNP  cholecalciferol (VITAMIN D3) 25 MCG (1000 UT) tablet Take 1,000 Units by mouth daily.   Yes [provider]  ferrous sulfate 325 (65 FE) MG tablet Take 1 tablet (325 mg total) by mouth 2 (two) times daily. With 1/2 glass of orange juice 12/15/15  Yes Davonna Belling, MD  folic acid (FOLVITE) 1 MG tablet TAKE 2 TABLETS (2 MG TOTAL) BY MOUTH DAILY. Patient taking differently: Take 1-2 mg by mouth 2 (two) times daily. Take 2 tablets (2mg ) in the morning and 1 tablet (1mg ) at bedtime 12/11/17  Yes Deveshwar, Abel Presto, MD  medroxyPROGESTERone (DEPO-PROVERA) 150 MG/ML injection Inject 150 mg into the muscle every 3 (three) months.   Yes [provider]  predniSONE (DELTASONE) 5 MG tablet Take 5 mg by mouth daily with breakfast.   Yes [provider]  tacrolimus (PROGRAF) 1 MG capsule Take 1 mg by mouth 2 (two) times daily.    Yes [provider]  Vitamin D, Ergocalciferol, (DRISDOL) 50000 units CAPS capsule Take 1 capsule (50,000 Units total) by mouth 2 (two) times a week. 12/21/17  Yes Deveshwar, Abel Presto, MD  pantoprazole (PROTONIX) 20 MG tablet Take 1 tablet (20 mg total) by mouth daily. Patient not  taking: Reported on 09/28/2018 05/30/18 08/30/18  Albesa Seen, PA-C    Family History Family History  Problem Relation Age of Onset   Aneurysm Mother    Stroke Sister    Seizures Sister    Lung cancer Maternal Aunt    Heart attack Paternal Uncle    Colon cancer Neg Hx    Esophageal cancer Neg Hx    Pancreatic cancer Neg Hx    Stomach cancer Neg Hx     Social History Social History   Tobacco Use   Smoking status: Never Smoker   Smokeless tobacco: Never Used  Substance Use Topics   Alcohol use: No   Drug use: No     Allergies   Hydroxychloroquine and Latex   Review of Systems Review of Systems  Constitutional: Negative for chills and fever.  HENT: Positive for rhinorrhea. Negative for sore throat.   Respiratory: Positive for cough (Productive) and shortness of breath.   Cardiovascular: Positive for chest pain (when coughing. ).  Gastrointestinal: Positive for nausea. Negative for abdominal pain and diarrhea.  Genitourinary: Positive for dysuria, frequency and hematuria. Negative for difficulty urinating.  Musculoskeletal: Positive for myalgias (bilateral upper extremities, lower extremities).  Skin: Negative for rash and wound.  Allergic/Immunologic: Positive for immunocompromised state (SLE, on tacrolimus).  Neurological: Positive for weakness. Negative for dizziness and headaches.  All other systems reviewed and are negative.    Physical Exam Updated Vital Signs BP 124/60 (BP Location: Right Arm)    Pulse (!) 110    Temp 98.4 F (36.9 C)    Resp 20    Ht 5\' 2"  (1.575 m)    Wt 54.4 kg    SpO2 97%    BMI 21.95 kg/m   Physical Exam Vitals signs and nursing note reviewed.  Constitutional:      Appearance: She is ill-appearing.  HENT:     Head: Normocephalic and atraumatic.     Mouth/Throat:     Mouth: Mucous membranes are dry.     Dentition: Normal dentition.     Comments: Thick, white patches on tongue.  Eyes:     General: No scleral  icterus.    Extraocular Movements: Extraocular movements intact.     Conjunctiva/sclera: Conjunctivae normal.     Pupils: Pupils are equal, round, and reactive to light.  Cardiovascular:     Rate and Rhythm: Regular rhythm. Tachycardia present.     Heart sounds: No murmur. No friction rub.  Pulmonary:     Effort: Pulmonary effort is normal.     Comments: Course breathe sounds throughout. Abdominal:     Palpations: Abdomen is soft.  Musculoskeletal:        General: Swelling (Mild, non-pitting circumferential swelling more prominent in bilateral forearms and bilateral hands.) and tenderness (Bilateral upper and lower extremity edema, to palpation and movement) present.  Skin:    General: Skin is warm and dry.     Coloration: Skin is not jaundiced.  Neurological:  General: No focal deficit present.     Mental Status: She is alert and oriented to person, place, and time.     Motor: Weakness (Difficulty moving between bed and commode without assistance due to weakness) present.  Psychiatric:        Mood and Affect: Mood normal.        Behavior: Behavior normal.      ED Treatments / Results  Labs (all labs ordered are listed, but only abnormal results are displayed) Labs Reviewed  COMPREHENSIVE METABOLIC PANEL - Abnormal; Notable for the following components:      Result Value   CO2 19 (*)    Calcium 8.5 (*)    Albumin 2.7 (*)    AST 68 (*)    All other components within normal limits  CBC WITH DIFFERENTIAL/PLATELET - Abnormal; Notable for the following components:   Hemoglobin 11.4 (*)    Lymphs Abs 0.5 (*)    All other components within normal limits  CULTURE, BLOOD (ROUTINE X 2)  SARS CORONAVIRUS 2 (HOSPITAL ORDER, [facility])  URINE CULTURE  LACTIC ACID, PLASMA  TSH  URINALYSIS, ROUTINE W REFLEX MICROSCOPIC  COMPREHENSIVE METABOLIC PANEL  PHOSPHORUS  MAGNESIUM  HEMOGLOBIN A1C  CBC  I-STAT BETA HCG BLOOD, ED ([facility], [facility], [facility] ONLY)     EKG EKG Interpretation  Date/Time:  Tuesday March 19 2019 07:50:46 EDT Ventricular Rate:  152 PR Interval:    QRS Duration: 85 QT Interval:  297 QTC Calculation: 473 R Axis:   68 Text Interpretation:  Sinus tachycardia Baseline wander in lead(s) V3 Abnormal ECG Confirmed by Carmin Muskrat ([id number]) on 03/19/2019 8:00:34 AM Also confirmed by Carmin Muskrat ([id number]), editor Philomena Doheny [phone])  on 03/19/2019 8:20:28 AM   Radiology Dg Chest Portable 1 View  Result Date: 03/19/2019 CLINICAL DATA:  Shortness of breath, general edema, fever and body aches for 2 days. EXAM: PORTABLE CHEST 1 VIEW COMPARISON:  CT chest 02/01/2019.  PA and lateral chest 06/09/2018. FINDINGS: Lungs are clear. Heart size is normal. No pneumothorax or pleural effusion. IMPRESSION: Negative chest. Electronically Signed   By: Inge Rise M.D.   On: 03/19/2019 08:37    Procedures Procedures (including critical care time)  Medications Ordered in ED Medications  acetaminophen (TYLENOL) tablet 650 mg (650 mg Oral Refused 03/19/19 0905)  HYDROcodone-acetaminophen (NORCO/VICODIN) 5-325 MG per tablet 1 tablet (1 tablet Oral Given 03/19/19 1625)  amLODipine (NORVASC) tablet 7.5 mg (7.5 mg Oral Given 03/19/19 1625)  DULoxetine (CYMBALTA) DR capsule 60 mg (60 mg Oral Given 03/19/19 1624)  predniSONE (DELTASONE) tablet 20 mg (has no administration in time range)  ferrous sulfate tablet 325 mg (325 mg Oral Given 04/13/49 5397)  folic acid (FOLVITE) tablet 2 mg (has no administration in time range)  cyclobenzaprine (FLEXERIL) tablet 10 mg (10 mg Oral Given 03/19/19 1625)  cholecalciferol (VITAMIN D3) tablet 1,000 Units (has no administration in time range)  Vitamin D (Ergocalciferol) (DRISDOL) capsule 50,000 Units (has no administration in time range)  enoxaparin (LOVENOX) injection 40 mg (has no administration in time range)  nystatin (MYCOSTATIN) 100000 UNIT/ML suspension 500,000 Units (has no administration in time range)   fluconazole (DIFLUCAN) tablet 150 mg (has no administration in time range)  0.9 %  sodium chloride infusion ( Intravenous New Bag/Given 03/19/19 1530)  acetaminophen (TYLENOL) tablet 650 mg (has no administration in time range)    Or  acetaminophen (TYLENOL) suppository 650 mg (has no administration in time range)  senna-docusate (Senokot-S)  tablet 1 tablet (has no administration in time range)  ondansetron (ZOFRAN) tablet 4 mg (has no administration in time range)    Or  ondansetron (ZOFRAN) injection 4 mg (has no administration in time range)  bisacodyl (DULCOLAX) suppository 10 mg (has no administration in time range)  albuterol (PROVENTIL) (2.5 MG/3ML) 0.083% nebulizer solution 2.5 mg (has no administration in time range)  guaiFENesin (MUCINEX) 12 hr tablet 1,200 mg (has no administration in time range)  amoxicillin-clavulanate (AUGMENTIN) 875-125 MG per tablet 1 tablet (1 tablet Oral Given 12/15/60 5638)  folic acid (FOLVITE) tablet 1 mg (has no administration in time range)  HYDROmorphone (DILAUDID) injection 1 mg (1 mg Intravenous Given 03/19/19 0847)  sodium chloride 0.9 % bolus 1,000 mL (0 mLs Intravenous Stopped 03/19/19 1024)  ondansetron (ZOFRAN) injection 4 mg (4 mg Intravenous Given 03/19/19 0847)  HYDROmorphone (DILAUDID) injection 1 mg (1 mg Intravenous Given 03/19/19 1122)  sodium chloride 0.9 % bolus 1,000 mL (1,000 mLs Intravenous New Bag/Given 03/19/19 1122)     Initial Impression / Assessment and Plan / ED Course  I have reviewed the triage vital signs and the nursing notes.  Pertinent labs & imaging results that were available during my care of the patient were reviewed by me and considered in my medical decision making (see chart for details).  [name] [name] is a 23 y/o female with a PMHx of SLE, currently being treated with Tacrolimus. She comes in today with c/o cough and SOB. Vital signs demonstrated mild fever with sinus tachycardia, so sepsis orders were placed. She is not  hypoxic and not in respiratory distress.  In light of immunocompromised state, patient is at significant risk of covid and opportunistic infections. With her SOB and productive cough, work up includes labs, covid testing and chest xray. Differential includes pneumonia, covid, viral URI, acute bronchitis. Chest X-ray was negative for acute cardiopulmonary process. Covid results were negative. CBC demonstrated a WBC of 4.9 This is an improvement from her last CBC, that was 2.9, however, this may be due to current illness. She does have some slight thrombocytopenia at 157. Lactic acid was 1.6. With a negative xray and nonspecific CBC, acute bronchitis vs viral URI are most likely.   CMP demonstrated a slight metabolic acidosis with a CO2 of 19. Electrolyes WNL. Albumin was low at 2.7. This may be secondary to protein-deficit diet, but a urinalysis would be helpful to rule out proteinuria.   In addition, patient is having urinary symptoms, including hematuria, dysuria and frequency. Differential includes acute cystitis, kidney stones, side effect of Tacrolimus. Unable to obtain urinalysis, as patient could not void.     After 1.5 liters of fluids, [name] [name] continued to be quite tachycardic in the 120s without significant improvement. She also continued to have significant generalized weakness. In light of a possible current Lupus flare with an acute process occurring, it was decided to admit [name] [name] at this time for further observation and stabilization. Hospitalist was consulted.  Final Clinical Impressions(s) / ED Diagnoses   Final diagnoses:  SLE exacerbation ([place])  Viral syndrome    ED Discharge Orders    None       Jose Persia, MD 03/19/19 1752    Carmin Muskrat, MD 03/20/19 [phone]

## 2019-03-19 NOTE — ED Notes (Signed)
ED TO INPATIENT HANDOFF REPORT  ED Nurse Name and Phone #: [phone]  S Name/Age/Gender [name] 23 y.o. female Room/Bed: WA16/WA16  Code Status   Code Status: Prior  Home/SNF/Other Home Patient oriented to: self, place, time and situation Is this baseline? Yes   Triage Complete: Triage complete  Chief Complaint lupus pt / short of breath   Triage Note Patient c/o SOB, generalized edema, and body aches, fever x 2 days   Allergies Allergies  Allergen Reactions  . Hydroxychloroquine Rash  . Latex Rash    Level of Care/Admitting Diagnosis ED Disposition    ED Disposition Condition Comment   Admit  Hospital Area: [place] [facility] [[id number]]  Level of Care: Telemetry [5]  Admit to tele based on following criteria: Monitor QTC interval  Covid Evaluation: Person Under Investigation (PUI)  Diagnosis: Lupus (systemic lupus erythematosus) ([place]) [237628]  Admitting Physician: Perry, [place] [[id number]]  Attending Physician: Raiford Noble LATIF [[id number]]  Estimated length of stay: past midnight tomorrow  Certification:: I certify this patient will need inpatient services for at least 2 midnights  PT Class (Do Not Modify): Inpatient [101]  PT Acc Code (Do Not Modify): Private [1]       B Medical/Surgery History Past Medical History:  Diagnosis Date  . Gout   . Hypertension   . Lupus ([place])   . Raynaud disease    Past Surgical History:  Procedure Laterality Date  . ORIF SHOULDER FRACTURE Left   . TONSILLECTOMY       A IV Location/Drains/Wounds Patient Lines/Drains/Airways Status   Active Line/Drains/Airways    Name:   Placement date:   Placement time:   Site:   Days:   Peripheral IV 03/19/19 Left Antecubital   03/19/19    0814    Antecubital   less than 1   Wound / Incision (Open or Dehisced) 01/09/18 Buttocks Left;Right Raw areas noted between pt buttocks, painful/red   01/09/18    0100    Buttocks   434          Intake/Output Last 24  hours  Intake/Output Summary (Last 24 hours) at 03/19/2019 1409 Last data filed at 03/19/2019 1024 Gross per 24 hour  Intake 1000 ml  Output -  Net 1000 ml    Labs/Imaging Results for orders placed or performed during the hospital encounter of 03/19/19 (from the past 48 hour(s))  Culture, blood (Routine x 2)     Status: None (Preliminary result)   Collection Time: 03/19/19  8:08 AM   Specimen: BLOOD  Result Value Ref Range   Specimen Description      BLOOD RIGHT ANTECUBITAL Performed at [facility], [place] [address] [place], [place] [address]    Special Requests      BOTTLES DRAWN AEROBIC AND ANAEROBIC Blood Culture adequate volume Performed at [place] [address] [place], [place] [address]    Culture PENDING    Report Status PENDING   Lactic acid, plasma     Status: None   Collection Time: 03/19/19  8:15 AM  Result Value Ref Range   Lactic Acid, Venous 1.6 0.5 - 1.9 mmol/L    Comment: Performed at [facility], [place] [address], [place], [place] [address]  Comprehensive metabolic panel     Status: Abnormal   Collection Time: 03/19/19  8:22 AM  Result Value Ref Range   Sodium 135 135 - 145 mmol/L   Potassium 4.3 3.5 - 5.1  mmol/L   Chloride 105 98 - 111 mmol/L   CO2 19 (L) 22 - 32 mmol/L   Glucose, Bld 94 70 - 99 mg/dL   BUN 12 6 - 20 mg/dL   Creatinine, Ser 0.71 0.44 - 1.00 mg/dL   Calcium 8.5 (L) 8.9 - 10.3 mg/dL   Total Protein 7.6 6.5 - 8.1 g/dL   Albumin 2.7 (L) 3.5 - 5.0 g/dL   AST 68 (H) 15 - 41 U/L   ALT 31 0 - 44 U/L   Alkaline Phosphatase 120 38 - 126 U/L   Total Bilirubin 0.3 0.3 - 1.2 mg/dL   GFR calc non Af Amer >60 >60 mL/min   GFR calc Af Amer >60 >60 mL/min   Anion gap 11 5 - 15    Comment: Performed at [facility], [place] [address], [place], [place] [address]  CBC with Differential     Status: Abnormal   Collection Time: 03/19/19  8:22 AM  Result Value Ref Range   WBC 4.9 4.0 -  10.5 K/uL   RBC 3.96 3.87 - 5.11 MIL/uL   Hemoglobin 11.4 (L) 12.0 - 15.0 g/dL   HCT 36.0 36.0 - 46.0 %   MCV 90.9 80.0 - 100.0 fL   MCH 28.8 26.0 - 34.0 pg   MCHC 31.7 30.0 - 36.0 g/dL   RDW 15.3 11.5 - 15.5 %   Platelets 157 150 - 400 K/uL    Comment: REPEATED TO VERIFY   nRBC 0.0 0.0 - 0.2 %   Neutrophils Relative % 85 %   Neutro Abs 4.1 1.7 - 7.7 K/uL   Lymphocytes Relative 11 %   Lymphs Abs 0.5 (L) 0.7 - 4.0 K/uL   Monocytes Relative 3 %   Monocytes Absolute 0.2 0.1 - 1.0 K/uL   Eosinophils Relative 0 %   Eosinophils Absolute 0.0 0.0 - 0.5 K/uL   Basophils Relative 0 %   Basophils Absolute 0.0 0.0 - 0.1 K/uL   Immature Granulocytes 1 %   Abs Immature Granulocytes 0.06 0.00 - 0.07 K/uL   Polychromasia PRESENT     Comment: Performed at [facility], [place] [address], [place], [place] [address]  I-Stat beta hCG blood, ED     Status: None   Collection Time: 03/19/19  8:27 AM  Result Value Ref Range   I-stat hCG, quantitative <5.0 <5 mIU/mL   Comment 3            Comment:   GEST. AGE      CONC.  (mIU/mL)   <=1 WEEK        5 - 50     2 WEEKS       50 - 500     3 WEEKS       100 - 10,000     4 WEEKS     1,000 - 30,000        FEMALE AND NON-PREGNANT FEMALE:     LESS THAN 5 mIU/mL   SARS Coronavirus 2 (CEPHEID- Performed in [place] hospital lab), Hosp Order     Status: None   Collection Time: 03/19/19  8:50 AM   Specimen: Nasopharyngeal Swab  Result Value Ref Range   SARS Coronavirus 2 NEGATIVE NEGATIVE    Comment: (NOTE) If result is NEGATIVE SARS-CoV-2 target nucleic acids are NOT DETECTED. The SARS-CoV-2 RNA is generally detectable in upper and lower  respiratory specimens during the acute phase of infection. The lowest  concentration of SARS-CoV-2  viral copies this assay can detect is 250  copies / mL. A negative result does not preclude SARS-CoV-2 infection  and should not be used as the sole basis for treatment or other  patient management  decisions.  A negative result may occur with  improper specimen collection / handling, submission of specimen other  than nasopharyngeal swab, presence of viral mutation(s) within the  areas targeted by this assay, and inadequate number of viral copies  (<250 copies / mL). A negative result must be combined with clinical  observations, patient history, and epidemiological information. If result is POSITIVE SARS-CoV-2 target nucleic acids are DETECTED. The SARS-CoV-2 RNA is generally detectable in upper and lower  respiratory specimens dur ing the acute phase of infection.  Positive  results are indicative of active infection with SARS-CoV-2.  Clinical  correlation with patient history and other diagnostic information is  necessary to determine patient infection status.  Positive results do  not rule out bacterial infection or co-infection with other viruses. If result is PRESUMPTIVE POSTIVE SARS-CoV-2 nucleic acids MAY BE PRESENT.   A presumptive positive result was obtained on the submitted specimen  and confirmed on repeat testing.  While 2019 novel coronavirus  (SARS-CoV-2) nucleic acids may be present in the submitted sample  additional confirmatory testing may be necessary for epidemiological  and / or clinical management purposes  to differentiate between  SARS-CoV-2 and other Sarbecovirus currently known to infect humans.  If clinically indicated additional testing with an alternate test  methodology [phone]) is advised. The SARS-CoV-2 RNA is generally  detectable in upper and lower respiratory sp ecimens during the acute  phase of infection. The expected result is Negative. Fact Sheet for Patients:  StrictlyIdeas.no Fact Sheet for Healthcare Providers: BankingDealers.co.za This test is not yet approved or cleared by the [place] FDA and has been authorized for detection and/or diagnosis of SARS-CoV-2 by FDA under an  Emergency Use Authorization (EUA).  This EUA will remain in effect (meaning this test can be used) for the duration of the COVID-19 declaration under Section 564(b)(1) of the Act, 21 U.S.C. section 360bbb-3(b)(1), unless the authorization is terminated or revoked sooner. Performed at [facility], [place] [address] [place], [place] [address]    Dg Chest Portable 1 View  Result Date: 03/19/2019 CLINICAL DATA:  Shortness of breath, general edema, fever and body aches for 2 days. EXAM: PORTABLE CHEST 1 VIEW COMPARISON:  CT chest 02/01/2019.  PA and lateral chest 06/09/2018. FINDINGS: Lungs are clear. Heart size is normal. No pneumothorax or pleural effusion. IMPRESSION: Negative chest. Electronically Signed   By: Inge Rise M.D.   On: 03/19/2019 08:37    Pending Labs Unresulted Labs (From admission, onward)    Start     Ordered   03/19/19 3151  Culture, blood (Routine x 2)  BLOOD CULTURE X 2,   STAT     03/19/19 0821   03/19/19 0822  Urinalysis, Routine w reflex microscopic  ONCE - STAT,   STAT     03/19/19 0821          Vitals/Pain Today's Vitals   03/19/19 1245 03/19/19 1315 03/19/19 1400 03/19/19 1408  BP: (!) 120/99 116/85 117/82   Pulse: (!) 120  (!) 117   Resp: 10 (!) 23 20   Temp:      TempSrc:      SpO2: 94% 96% 96%   Weight:      Height:      PainSc:  3     Isolation Precautions Airborne and Contact precautions  Medications Medications  acetaminophen (TYLENOL) tablet 650 mg (650 mg Oral Refused 03/19/19 0905)  HYDROmorphone (DILAUDID) injection 1 mg (1 mg Intravenous Given 03/19/19 0847)  sodium chloride 0.9 % bolus 1,000 mL (0 mLs Intravenous Stopped 03/19/19 1024)  ondansetron (ZOFRAN) injection 4 mg (4 mg Intravenous Given 03/19/19 0847)  HYDROmorphone (DILAUDID) injection 1 mg (1 mg Intravenous Given 03/19/19 1122)  sodium chloride 0.9 % bolus 1,000 mL (1,000 mLs Intravenous New Bag/Given 03/19/19 1122)    Mobility walks with person  assist Low fall risk   Focused Assessments Cardiac Assessment Handoff:    Lab Results  Component Value Date   CKTOTAL 483 (H) 06/10/2018   TROPONINI <0.03 06/05/2018   No results found for: DDIMER Does the Patient currently have chest pain? No     R Recommendations: See Admitting Provider Note  Report given to:   Additional Notes:  Hx of Lupus

## 2019-03-19 NOTE — ED Notes (Signed)
ED Provider at bedside. 

## 2019-03-19 NOTE — H&P (Signed)
History and Physical    [name] [name] YHC:[id number] DOB: 09-27-1995 DOA: 03/19/2019  PCP: [name], FNP   Patient coming from: Home  Chief Complaint: Cough and Leg Swelling   HPI: [name] [name] is a 23 y.o. female with medical history significant for but not limited to Gout, HTN, Lupus, Raynaud's disease and other comorbidites who presents with a  CC of 3 day cough and worsening LE Swelling. Also states that she felt weak and dehydrated and states she has had productive cough. Admits to feeling tighter in her extremities typical for a lupus flare. Also states that she feels as if it is more difficult to swallow because she feels like she has thrush. Denies any CP or SOB. No nausea or vomiting. Also says that she has not been eating well at all due to Nausea and inability to keep foods down. States she has been coughing and has been trying to hydrate but does not feel like she has done an adequate job. No other concerns or complaints at this Time but states she has been weaker. [facility] was asked to admit this patient for Cough and Leg swelling as well as decreased po intake.   ED Course: In the ED she was given IVF Boluses (2 Liters) basic blood work done and a CXR. Also had Blood Cultures, Urinalysis, TSH, EKG done. SARS-CoV-2 Negative.   Review of Systems: As per HPI otherwise all other systems reviewed and negative.   Past Medical History:  Diagnosis Date  . Gout   . Hypertension   . Lupus ([place])   . Raynaud disease    Past Surgical History:  Procedure Laterality Date  . ORIF SHOULDER FRACTURE Left   . TONSILLECTOMY     SOCIAL HISTORY   reports that she has never smoked. She has never used smokeless tobacco. She reports that she does not drink alcohol or use drugs.  Allergies  Allergen Reactions  . Hydroxychloroquine Rash  . Latex Rash   Family History  Problem Relation Age of Onset  . Aneurysm Mother   . Stroke Sister   . Seizures Sister   . Lung cancer Maternal Aunt   .  Heart attack Paternal Uncle   . Colon cancer Neg Hx   . Esophageal cancer Neg Hx   . Pancreatic cancer Neg Hx   . Stomach cancer Neg Hx    Prior to Admission medications   Medication Sig Start Date End Date Taking? Authorizing Provider  amLODipine (NORVASC) 5 MG tablet Take 1.5 tablets (7.5 mg total) by mouth daily. 02/06/19 02/06/20 Yes [name], FNP  cholecalciferol (VITAMIN D3) 25 MCG (1000 UT) tablet Take 1,000 Units by mouth daily.   Yes [provider]  cyclobenzaprine (FLEXERIL) 10 MG tablet TAKE 1 TABLET BY MOUTH 3 (THREE) TIMES DAILY AS NEEDED FOR UP TO 7 DAYS FOR MUSCLE SPASMS. Patient taking differently: Take 10 mg by mouth 3 (three) times daily as needed for muscle spasms.  02/22/19  Yes [name], FNP  DULoxetine (CYMBALTA) 30 MG capsule Take 2 capsules (60 mg total) by mouth daily. Take 1 cap po qd  x 2 weeks then increase to 60 mg po qd thereafter 02/06/19  Yes [name], FNP  ferrous sulfate 325 (65 FE) MG tablet Take 1 tablet (325 mg total) by mouth 2 (two) times daily. With 1/2 glass of orange juice 12/15/15  Yes Davonna Belling, MD  folic acid (FOLVITE) 1 MG tablet TAKE 2 TABLETS (2 MG TOTAL) BY MOUTH  DAILY. Patient taking differently: Take 1-2 mg by mouth 2 (two) times daily. Take 2 tablets (2mg ) in the morning and 1 tablet (1mg ) at bedtime 12/11/17  Yes Deveshwar, Abel Presto, MD  HYDROcodone-acetaminophen (NORCO/VICODIN) 5-325 MG tablet Take 1 tablet by mouth every 6 (six) hours as needed for moderate pain. 01/25/19  Yes Lawyer, Harrell Gave, PA-C  medroxyPROGESTERone (DEPO-PROVERA) 150 MG/ML injection Inject 150 mg into the muscle every 3 (three) months.   Yes [provider]  predniSONE (DELTASONE) 5 MG tablet Take 5 mg by mouth daily with breakfast.   Yes [provider]  tacrolimus (PROGRAF) 1 MG capsule Take 1 mg by mouth 2 (two) times daily.    Yes [provider]  Vitamin D, Ergocalciferol, (DRISDOL) 50000 units CAPS capsule Take  1 capsule (50,000 Units total) by mouth 2 (two) times a week. 12/21/17  Yes Deveshwar, Abel Presto, MD  cephALEXin (KEFLEX) 500 MG capsule Take 2 capsules (1,000 mg total) by mouth 2 (two) times daily. Patient not taking: Reported on 03/19/2019 01/25/19   Lawyer, Harrell Gave, PA-C  hydrocortisone (ANUSOL-HC) 2.5 % rectal cream Place 1 application rectally 2 (two) times daily. Patient not taking: Reported on 01/25/2019 08/30/18   [name], FNP  pantoprazole (PROTONIX) 20 MG tablet Take 1 tablet (20 mg total) by mouth daily. Patient not taking: Reported on 09/28/2018 05/30/18 08/30/18  Langston Masker B, PA-C  predniSONE (DELTASONE) 50 MG tablet Take 1 tablet (50 mg total) by mouth daily. Patient not taking: Reported on 03/19/2019 01/25/19   Dalia Heading, PA-C   Physical Exam: Vitals:   03/19/19 1245 03/19/19 1315 03/19/19 1400 03/19/19 1529  BP: (!) 120/99 116/85 117/82 124/60  Pulse: (!) 120  (!) 117 (!) 110  Resp: 10 (!) 23 20   Temp:    98.4 F (36.9 C)  TempSrc:      SpO2: 94% 96% 96% 97%  Weight:      Height:       Constitutional: WN/WD, NAD and appears calm and comfortable Eyes: Lids and conjunctivae normal, sclerae anicteric  ENMT: External Ears, Nose appear normal. Grossly normal hearing. Had  Neck: Appears normal, supple, no cervical masses, normal ROM, no appreciable thyromegaly; no JVD Respiratory: Diminished to auscultation bilaterally, no wheezing, rales, rhonchi or crackles. Normal respiratory effort and patient is not tachypenic. No accessory muscle use. Unlabored Breathing and  Cardiovascular: Tachycardic Rate but regular rhythm, no murmurs / rubs / gallops. S1 and S2 auscultated. Pedal extremity edema.  Abdomen: Soft, non-tender, non-distended. No masses palpated. No appreciable hepatosplenomegaly. Bowel sounds positive x4.  GU: Deferred. Musculoskeletal: No clubbing / cyanosis of digits/nails. No joint deformity upper and lower extremities.  Skin: No rashes, lesions,  ulcers on a limited skin evaluation. No induration; Warm and dry.  Neurologic: CN 2-12 grossly intact with no focal deficits. Sensation intact in all 4 Extremities, DTR normal. Strength 5/5 in all 4. Romberg sign cerebellar reflexes not assessed.  Psychiatric: Normal judgment and insight. Alert and oriented x 3. Normal mood and appropriate affect.   Labs on Admission: I have personally reviewed following labs and imaging studies  CBC: Recent Labs  Lab 03/19/19 0822  WBC 4.9  NEUTROABS 4.1  HGB 11.4*  HCT 36.0  MCV 90.9  PLT 295   Basic Metabolic Panel: Recent Labs  Lab 03/19/19 0822  NA 135  K 4.3  CL 105  CO2 19*  GLUCOSE 94  BUN 12  CREATININE 0.71  CALCIUM 8.5*   GFR: Estimated Creatinine Clearance: 85.8 mL/min (  by C-G formula based on SCr of 0.71 mg/dL). Liver Function Tests: Recent Labs  Lab 03/19/19 0822  AST 68*  ALT 31  ALKPHOS 120  BILITOT 0.3  PROT 7.6  ALBUMIN 2.7*   No results for input(s): LIPASE, AMYLASE in the last 168 hours. No results for input(s): AMMONIA in the last 168 hours. Coagulation Profile: No results for input(s): INR, PROTIME in the last 168 hours. Cardiac Enzymes: No results for input(s): CKTOTAL, CKMB, CKMBINDEX, TROPONINI in the last 168 hours. BNP (last 3 results) No results for input(s): PROBNP in the last 8760 hours. HbA1C: No results for input(s): HGBA1C in the last 72 hours. CBG: No results for input(s): GLUCAP in the last 168 hours. Lipid Profile: No results for input(s): CHOL, HDL, LDLCALC, TRIG, CHOLHDL, LDLDIRECT in the last 72 hours. Thyroid Function Tests: Recent Labs    03/19/19 1549  TSH 3.623   Anemia Panel: No results for input(s): VITAMINB12, FOLATE, FERRITIN, TIBC, IRON, RETICCTPCT in the last 72 hours. Urine analysis:    Component Value Date/Time   COLORURINE YELLOW 01/25/2019 [place] 01/25/2019 1126   LABSPEC 1.009 01/25/2019 1126   PHURINE 7.0 01/25/2019 1126   GLUCOSEU  NEGATIVE 01/25/2019 1126   HGBUR SMALL (A) 01/25/2019 1126   [place] 01/25/2019 1126   BILIRUBINUR small 08/30/2018 0953   KETONESUR NEGATIVE 01/25/2019 1126   PROTEINUR 100 (A) 01/25/2019 1126   UROBILINOGEN 0.2 08/30/2018 0953   NITRITE NEGATIVE 01/25/2019 1126   LEUKOCYTESUR MODERATE (A) 01/25/2019 1126   Sepsis Labs: !!!!!!!!!!!!!!!!!!!!!!!!!!!!!!!!!!!!!!!!!!!! @LABRCNTIP (procalcitonin:4,lacticidven:4) ) Recent Results (from the past 240 hour(s))  Culture, blood (Routine x 2)     Status: None (Preliminary result)   Collection Time: 03/19/19  8:08 AM   Specimen: BLOOD  Result Value Ref Range Status   Specimen Description   Final    BLOOD RIGHT ANTECUBITAL Performed at [facility], [place] [address], [place], [place] [address]    Special Requests   Final    BOTTLES DRAWN AEROBIC AND ANAEROBIC Blood Culture adequate volume Performed at [place] [address] [place], [place] [address]    Culture PENDING  Incomplete   Report Status PENDING  Incomplete  SARS Coronavirus 2 (CEPHEID- Performed in [facility] hospital lab), Hosp Order     Status: None   Collection Time: 03/19/19  8:50 AM   Specimen: Nasopharyngeal Swab  Result Value Ref Range Status   SARS Coronavirus 2 NEGATIVE NEGATIVE Final    Comment: (NOTE) If result is NEGATIVE SARS-CoV-2 target nucleic acids are NOT DETECTED. The SARS-CoV-2 RNA is generally detectable in upper and lower  respiratory specimens during the acute phase of infection. The lowest  concentration of SARS-CoV-2 viral copies this assay can detect is 250  copies / mL. A negative result does not preclude SARS-CoV-2 infection  and should not be used as the sole basis for treatment or other  patient management decisions.  A negative result may occur with  improper specimen collection / handling, submission of specimen other  than nasopharyngeal swab, presence of viral mutation(s) within the  areas targeted by  this assay, and inadequate number of viral copies  (<250 copies / mL). A negative result must be combined with clinical  observations, patient history, and epidemiological information. If result is POSITIVE SARS-CoV-2 target nucleic acids are DETECTED. The SARS-CoV-2 RNA is generally detectable in upper and lower  respiratory specimens dur ing the acute phase of infection.  Positive  results  are indicative of active infection with SARS-CoV-2.  Clinical  correlation with patient history and other diagnostic information is  necessary to determine patient infection status.  Positive results do  not rule out bacterial infection or co-infection with other viruses. If result is PRESUMPTIVE POSTIVE SARS-CoV-2 nucleic acids MAY BE PRESENT.   A presumptive positive result was obtained on the submitted specimen  and confirmed on repeat testing.  While 2019 novel coronavirus  (SARS-CoV-2) nucleic acids may be present in the submitted sample  additional confirmatory testing may be necessary for epidemiological  and / or clinical management purposes  to differentiate between  SARS-CoV-2 and other Sarbecovirus currently known to infect humans.  If clinically indicated additional testing with an alternate test  methodology [phone]) is advised. The SARS-CoV-2 RNA is generally  detectable in upper and lower respiratory sp ecimens during the acute  phase of infection. The expected result is Negative. Fact Sheet for Patients:  StrictlyIdeas.no Fact Sheet for Healthcare Providers: BankingDealers.co.za This test is not yet approved or cleared by the [place] FDA and has been authorized for detection and/or diagnosis of SARS-CoV-2 by FDA under an Emergency Use Authorization (EUA).  This EUA will remain in effect (meaning this test can be used) for the duration of the COVID-19 declaration under Section 564(b)(1) of the Act, 21 U.S.C. section  360bbb-3(b)(1), unless the authorization is terminated or revoked sooner. Performed at [facility], [place] [address] [place], [place] [address]     Radiological Exams on Admission: Dg Chest Portable 1 View  Result Date: 03/19/2019 CLINICAL DATA:  Shortness of breath, general edema, fever and body aches for 2 days. EXAM: PORTABLE CHEST 1 VIEW COMPARISON:  CT chest 02/01/2019.  PA and lateral chest 06/09/2018. FINDINGS: Lungs are clear. Heart size is normal. No pneumothorax or pleural effusion. IMPRESSION: Negative chest. Electronically Signed   By: Inge Rise M.D.   On: 03/19/2019 08:37   EKG: Independently reviewed. Showed Sinus Tachycardia at 152 and QTc of 473.   Assessment/Plan Active Problems:   Raynaud's disease   Seasonal allergies   IDA (iron deficiency anemia)   SLE (systemic lupus erythematosus) (HCC)   Depression   Thrush   Lupus (systemic lupus erythematosus) (HCC)   Bronchitis   Essential hypertension   Elevated AST (SGOT)   Dehydration   Sinus tachycardia   Metabolic acidosis   Oral thrush  Acute Bronchitis associated with Cough and SOB -Place in Obs Telemetry -COVID-19 Testing was Negative -CXR showed "Lungs are clear. Heart size is normal. No pneumothorax or pleural effusion." -Start Augmentin 875-125 mg po BID for coverage x 5 Days -C/w IVF Hydration with NS at 75 mL/hr -Added Albuterol 2.5 mg Neb q2hprn Wheezing -Hold Tacrolimus in the setting of ruling out infection  -C/w Guaifenesin 1200 mg po Daily   Suspected Lupus Flare -Not taking Hydroxychloroquine due to Allergy -Had LE which is consistent with the beginning of a Lupus Flare -Now on Tacrolimus but will hold; C/w Folic Acid 2 mg Daily and 1 mg po qHS -On Chronic Prednisone 5 mg but will increase to 20 mg Daily for Flare -C/w Pain Control; Given 2 doses of IV Dilauid this AM  -C/w Cyclobenzaprine 10 mg TIDprn Muscle Spasms   Raynaud's Syndrome -C/w Amlodipine 7.5 mg po  Daily   HTN -C/w Amlodipine 7.5 mg po Daily   Elevated AST -AST on Admission was 68 -C/w IVF Hydration -Continue to Monitor and Trend Hepatic Fxn Panel -If consistently elevated Will need  RUQ U/S and Acute Hepatitis Panel in the AM -Repeat CMP in AM   Dehydration -Given 2 Liters of NS in the ED with improvement -C/w NS at 75 mL/hr x1 day  Sinus Tachycardia -Patient's HR was Sinus Tachycardia and in the setting of Dehydration -Given 2 Liters of NS on Admission; Continue IVF with NS at 75 mL/hr -Continue to Monitor to Telemetry  Metabolic Acidosis -Patient's CO2 on Admission was 19 and AG was 11 -Continue IVF with NS at 75 mL/hr x1 day -Continue to Monitor and Trend -Repeat CMP in AM   Oral Thrush -In the setting of Chronic Steroid Use -Start Mycostatin 500,000 Units po 4 times daily -Given 1x of Fluconazole 150 mg po   DVT prophylaxis: Enoxaparin 40 mg sq24h Code Status: FULL CODE  Family Communication: No family present at bedside  Disposition Plan: No family present at bedside  Consults called: None Admission status: Obs Telemetry   Severity of Illness: The appropriate patient status for this patient is OBSERVATION. Observation status is judged to be reasonable and necessary in order to provide the required intensity of service to ensure the patient's safety. The patient's presenting symptoms, physical exam findings, and initial radiographic and laboratory data in the context of their medical condition is felt to place them at decreased risk for further clinical deterioration. Furthermore, it is anticipated that the patient will be medically stable for discharge from the hospital within 2 midnights of admission. The following factors support the patient status of observation.   " The patient's presenting symptoms include Cough and Leg Swelling. " The physical exam findings include Tachycardia. " The initial radiographic and laboratory data are relatively reassuring.  *  I certify that at the point of admission it is my clinical judgment that the patient will require inpatient hospital care spanning beyond 2 midnights from the point of admission due to high intensity of service, high risk for further deterioration and high frequency of surveillance required.Kerney Elbe, D.O. [facility] PAGER is on [place]  If 7PM-7AM, please contact night-coverage [email or web address] Password [email or web address]  03/19/2019, 8:08 PM

## 2019-03-19 NOTE — ED Triage Notes (Signed)
Patient c/o SOB, generalized edema, and body aches, fever x 2 days

## 2019-03-19 NOTE — ED Notes (Signed)
Bed: [facility] Expected date:  Expected time:  Means of arrival:  Comments: 

## 2019-03-20 DIAGNOSIS — I7300 Raynaud's syndrome without gangrene: Secondary | ICD-10-CM | POA: Diagnosis present

## 2019-03-20 DIAGNOSIS — F3289 Other specified depressive episodes: Secondary | ICD-10-CM | POA: Diagnosis not present

## 2019-03-20 DIAGNOSIS — I10 Essential (primary) hypertension: Secondary | ICD-10-CM | POA: Diagnosis present

## 2019-03-20 DIAGNOSIS — E440 Moderate protein-calorie malnutrition: Secondary | ICD-10-CM | POA: Diagnosis present

## 2019-03-20 DIAGNOSIS — I471 Supraventricular tachycardia: Secondary | ICD-10-CM | POA: Diagnosis present

## 2019-03-20 DIAGNOSIS — D509 Iron deficiency anemia, unspecified: Secondary | ICD-10-CM | POA: Diagnosis present

## 2019-03-20 DIAGNOSIS — M329 Systemic lupus erythematosus, unspecified: Secondary | ICD-10-CM | POA: Diagnosis not present

## 2019-03-20 DIAGNOSIS — F0631 Mood disorder due to known physiological condition with depressive features: Secondary | ICD-10-CM | POA: Diagnosis present

## 2019-03-20 DIAGNOSIS — J209 Acute bronchitis, unspecified: Secondary | ICD-10-CM | POA: Diagnosis present

## 2019-03-20 DIAGNOSIS — E872 Acidosis: Secondary | ICD-10-CM | POA: Diagnosis present

## 2019-03-20 DIAGNOSIS — Z6822 Body mass index (BMI) 22.0-22.9, adult: Secondary | ICD-10-CM | POA: Diagnosis not present

## 2019-03-20 DIAGNOSIS — M109 Gout, unspecified: Secondary | ICD-10-CM | POA: Diagnosis present

## 2019-03-20 DIAGNOSIS — Z20828 Contact with and (suspected) exposure to other viral communicable diseases: Secondary | ICD-10-CM | POA: Diagnosis present

## 2019-03-20 DIAGNOSIS — R339 Retention of urine, unspecified: Secondary | ICD-10-CM | POA: Diagnosis present

## 2019-03-20 DIAGNOSIS — J40 Bronchitis, not specified as acute or chronic: Secondary | ICD-10-CM | POA: Diagnosis not present

## 2019-03-20 DIAGNOSIS — Z823 Family history of stroke: Secondary | ICD-10-CM | POA: Diagnosis not present

## 2019-03-20 DIAGNOSIS — M3214 Glomerular disease in systemic lupus erythematosus: Secondary | ICD-10-CM | POA: Diagnosis present

## 2019-03-20 DIAGNOSIS — R740 Nonspecific elevation of levels of transaminase and lactic acid dehydrogenase [LDH]: Secondary | ICD-10-CM | POA: Diagnosis present

## 2019-03-20 DIAGNOSIS — J302 Other seasonal allergic rhinitis: Secondary | ICD-10-CM | POA: Diagnosis present

## 2019-03-20 DIAGNOSIS — E860 Dehydration: Secondary | ICD-10-CM | POA: Diagnosis present

## 2019-03-20 DIAGNOSIS — R197 Diarrhea, unspecified: Secondary | ICD-10-CM | POA: Diagnosis present

## 2019-03-20 DIAGNOSIS — B349 Viral infection, unspecified: Secondary | ICD-10-CM | POA: Diagnosis present

## 2019-03-20 DIAGNOSIS — R600 Localized edema: Secondary | ICD-10-CM | POA: Diagnosis present

## 2019-03-20 DIAGNOSIS — Z7952 Long term (current) use of systemic steroids: Secondary | ICD-10-CM | POA: Diagnosis not present

## 2019-03-20 DIAGNOSIS — B370 Candidal stomatitis: Secondary | ICD-10-CM | POA: Diagnosis present

## 2019-03-20 DIAGNOSIS — R609 Edema, unspecified: Secondary | ICD-10-CM | POA: Diagnosis not present

## 2019-03-20 DIAGNOSIS — Z793 Long term (current) use of hormonal contraceptives: Secondary | ICD-10-CM | POA: Diagnosis not present

## 2019-03-20 LAB — CBC
HCT: 33.6 % — ABNORMAL LOW (ref 36.0–46.0)
Hemoglobin: 10.6 g/dL — ABNORMAL LOW (ref 12.0–15.0)
MCH: 28.6 pg (ref 26.0–34.0)
MCHC: 31.5 g/dL (ref 30.0–36.0)
MCV: 90.6 fL (ref 80.0–100.0)
Platelets: 103 10*3/uL — ABNORMAL LOW (ref 150–400)
RBC: 3.71 MIL/uL — ABNORMAL LOW (ref 3.87–5.11)
RDW: 15.5 % (ref 11.5–15.5)
WBC: 4.2 10*3/uL (ref 4.0–10.5)
nRBC: 0 % (ref 0.0–0.2)

## 2019-03-20 LAB — HEMOGLOBIN A1C
Hgb A1c MFr Bld: 4.5 % — ABNORMAL LOW (ref 4.8–5.6)
Mean Plasma Glucose: 82.45 mg/dL

## 2019-03-20 LAB — C-REACTIVE PROTEIN: CRP: 5.8 mg/dL — ABNORMAL HIGH (ref <1.0)

## 2019-03-20 LAB — COMPREHENSIVE METABOLIC PANEL
ALT: 25 U/L (ref 0–44)
AST: 63 U/L — ABNORMAL HIGH (ref 15–41)
Albumin: 2.2 g/dL — ABNORMAL LOW (ref 3.5–5.0)
Alkaline Phosphatase: 95 U/L (ref 38–126)
Anion gap: 6 (ref 5–15)
BUN: 9 mg/dL (ref 6–20)
CO2: 18 mmol/L — ABNORMAL LOW (ref 22–32)
Calcium: 7.7 mg/dL — ABNORMAL LOW (ref 8.9–10.3)
Chloride: 112 mmol/L — ABNORMAL HIGH (ref 98–111)
Creatinine, Ser: 0.59 mg/dL (ref 0.44–1.00)
GFR calc Af Amer: >60 mL/min (ref >60)
GFR calc non Af Amer: >60 mL/min (ref >60)
Glucose, Bld: 83 mg/dL (ref 70–99)
Potassium: 4 mmol/L (ref 3.5–5.1)
Sodium: 136 mmol/L (ref 135–145)
Total Bilirubin: 0.5 mg/dL (ref 0.3–1.2)
Total Protein: 6.8 g/dL (ref 6.5–8.1)

## 2019-03-20 LAB — LACTIC ACID, PLASMA: Lactic Acid, Venous: 1 mmol/L (ref 0.5–1.9)

## 2019-03-20 LAB — URINALYSIS, ROUTINE W REFLEX MICROSCOPIC
Bacteria, UA: NONE SEEN
Bilirubin Urine: NEGATIVE
Glucose, UA: NEGATIVE mg/dL
Ketones, ur: NEGATIVE mg/dL
Leukocytes,Ua: NEGATIVE
Nitrite: NEGATIVE
Protein, ur: 100 mg/dL — AB
Specific Gravity, Urine: 1.014 (ref 1.005–1.030)
pH: 6 (ref 5.0–8.0)

## 2019-03-20 LAB — GLUCOSE, CAPILLARY
Glucose-Capillary: 68 mg/dL — ABNORMAL LOW (ref 70–99)
Glucose-Capillary: 71 mg/dL (ref 70–99)
Glucose-Capillary: 90 mg/dL (ref 70–99)

## 2019-03-20 LAB — MAGNESIUM: Magnesium: 1.2 mg/dL — ABNORMAL LOW (ref 1.7–2.4)

## 2019-03-20 LAB — HCG, SERUM, QUALITATIVE: Preg, Serum: NEGATIVE

## 2019-03-20 LAB — SEDIMENTATION RATE: Sed Rate: 74 mm/hr — ABNORMAL HIGH (ref 0–22)

## 2019-03-20 LAB — PHOSPHORUS: Phosphorus: 3.7 mg/dL (ref 2.5–4.6)

## 2019-03-20 LAB — PROCALCITONIN: Procalcitonin: 0.11 ng/mL

## 2019-03-20 MED ORDER — AMLODIPINE BESYLATE 10 MG PO TABS
10.0000 mg | ORAL_TABLET | Freq: Every day | ORAL | Status: DC
  Administered 2019-03-21 – 2019-03-25 (×4): 10 mg via ORAL
  Filled 2019-03-20 (×5): qty 1

## 2019-03-20 MED ORDER — BETHANECHOL CHLORIDE 10 MG PO TABS
5.0000 mg | ORAL_TABLET | Freq: Three times a day (TID) | ORAL | Status: DC
  Administered 2019-03-20 – 2019-03-22 (×6): 5 mg via ORAL
  Filled 2019-03-20 (×11): qty 0.5

## 2019-03-20 MED ORDER — DEXTROSE-NACL 5-0.45 % IV SOLN
INTRAVENOUS | Status: DC
  Administered 2019-03-20 – 2019-03-24 (×8): via INTRAVENOUS

## 2019-03-20 MED ORDER — METHYLPREDNISOLONE SODIUM SUCC 40 MG IJ SOLR
40.0000 mg | Freq: Every day | INTRAMUSCULAR | Status: DC
  Administered 2019-03-20 – 2019-03-23 (×4): 40 mg via INTRAVENOUS
  Filled 2019-03-20 (×4): qty 1

## 2019-03-20 MED ORDER — PANTOPRAZOLE SODIUM 20 MG PO TBEC
20.0000 mg | DELAYED_RELEASE_TABLET | Freq: Every day | ORAL | Status: DC
  Administered 2019-03-20 – 2019-03-24 (×4): 20 mg via ORAL
  Filled 2019-03-20 (×5): qty 1

## 2019-03-20 MED ORDER — METOPROLOL TARTRATE 5 MG/5ML IV SOLN
2.5000 mg | INTRAVENOUS | Status: DC | PRN
  Administered 2019-03-21: 05:00:00 2.5 mg via INTRAVENOUS
  Filled 2019-03-20 (×2): qty 5

## 2019-03-20 MED ORDER — METOPROLOL TARTRATE 25 MG PO TABS
12.5000 mg | ORAL_TABLET | Freq: Two times a day (BID) | ORAL | Status: DC
  Administered 2019-03-20: 12.5 mg via ORAL
  Filled 2019-03-20 (×2): qty 1

## 2019-03-20 MED ORDER — SENNOSIDES-DOCUSATE SODIUM 8.6-50 MG PO TABS
2.0000 | ORAL_TABLET | Freq: Every day | ORAL | Status: DC
  Filled 2019-03-20 (×3): qty 2

## 2019-03-20 MED ORDER — OXYCODONE HCL 5 MG PO TABS
5.0000 mg | ORAL_TABLET | ORAL | Status: DC | PRN
  Administered 2019-03-20 – 2019-03-24 (×5): 5 mg via ORAL
  Filled 2019-03-20 (×6): qty 1

## 2019-03-20 MED ORDER — AMLODIPINE BESYLATE 5 MG PO TABS
2.5000 mg | ORAL_TABLET | Freq: Once | ORAL | Status: AC
  Administered 2019-03-20: 2.5 mg via ORAL
  Filled 2019-03-20: qty 1

## 2019-03-20 MED ORDER — BETHANECHOL CHLORIDE 5 MG PO TABS
5.0000 mg | ORAL_TABLET | Freq: Three times a day (TID) | ORAL | Status: DC
  Filled 2019-03-20: qty 1

## 2019-03-20 MED ORDER — TRAMADOL HCL 50 MG PO TABS: 50.0000 mg | ORAL_TABLET | Freq: Two times a day (BID) | ORAL | Status: DC | PRN

## 2019-03-20 MED ORDER — MAGNESIUM SULFATE 4 GM/100ML IV SOLN
4.0000 g | Freq: Two times a day (BID) | INTRAVENOUS | Status: AC
  Administered 2019-03-20 (×2): 4 g via INTRAVENOUS
  Filled 2019-03-20 (×2): qty 100

## 2019-03-20 NOTE — Progress Notes (Signed)
Pt with feelings of urgency and frequency but unable to void. Bladder scan reveals 350 ml. In and out cath performed and 600 ml of urine obtained. Urinalysis and urine culture collected. Will continue to monitor.

## 2019-03-20 NOTE — Progress Notes (Signed)
PROGRESS NOTE  [name] [name] XTK:[id number] DOB: 28-Jun-1996 DOA: 03/19/2019 PCP: [name], FNP  HPI/Recap of past 24 hours: [name] [name] is a 23 y.o. female with medical history significant for but not limited to Gout, HTN, Lupus, Raynaud's disease and other comorbidites who presents with a  CC of 3 day cough and worsening LE Swelling. Also states that she felt weak and dehydrated and states she has had productive cough. Admits to feeling tighter in her extremities typical for a lupus flare. Also states that she feels as if it is more difficult to swallow because she feels like she has thrush. Denies any CP or SOB. No nausea or vomiting. Also says that she has not been eating well at all due to Nausea and inability to keep foods down. States she has been coughing and has been trying to hydrate but does not feel like she has done an adequate job. No other concerns or complaints at this Time but states she has been weaker. TRH was asked to admit this patient for Cough and Leg swelling as well as decreased po intake.   ED Course: In the ED she was given IVF Boluses (2 Liters) basic blood work done and a CXR. Also had Blood Cultures, Urinalysis, TSH, EKG done. SARS-CoV-2 Negative.   03/20/19: Patient was seen and examined at her bedside this morning.  She reports her cough is improving.  Still has poor appetite and feels weak.  States her symptoms are similar to prior lupus flare.  Follows with Dr. [name] at [facility].  Assessment/Plan: Active Problems:   Raynaud's disease   Seasonal allergies   IDA (iron deficiency anemia)   SLE (systemic lupus erythematosus) (HCC)   Depression   Thrush   Lupus (systemic lupus erythematosus) (HCC)   Bronchitis   Essential hypertension   Elevated AST (SGOT)   Dehydration   Sinus tachycardia   Metabolic acidosis   Oral thrush  Suspected lupus flare Persistent symptomatology generalized weakness, bilateral lower extremity edema, poor appetite and oral  intake Allergy to hydroxychloroquine Continue folic acid Continue steroids Optimize pain control We will contact the office of Dr. [name] at [facility]  Acute bronchitis Independently reviewed chest x-ray done on admission which showed no lobular infiltrates COVID-19 testing negative Continue Augmentin twice daily x5 days Continue symptomatic care  Raynaud's syndrome/hypertension Continue amlodipine 7.5 mg p.o. daily  Severe hypomagnesemia Magnesium 1.2 Repleted with IV magnesium 4 g x 2.  Acute dehydration secondary to poor oral intake Continue gentle IV fluid hydration normal saline at 75 cc/h Monitor urine output  Acute urinary retention Per nursing staff feeling of urgency and frequency but unable to void Bladder scan revealed 350 cc Straight in and out catheter, 600 cc of urine obtained. Continue to closely monitor urine output  Sinus tachycardia, possibly from dehydration Obtain twelve-lead EKG Denies chest pain Lactic acid 1.0 Procalcitonin 0.11  Moderate protein calorie malnutrition Albumin 2.2 BMI 22 Encourage increasing oral protein calorie intake  Non-anion gap metabolic acidosis Bicarb 18 Anion gap 6  Elevated AST -AST on Admission was 68 -C/w IVF Hydration -Continue to Monitor and Trend Hepatic Fxn Panel -If consistently elevated Will need RUQ U/S and Acute Hepatitis Panel in the AM -Repeat CMP in AM   Oral Thrush -In the setting of Chronic Steroid Use -Continue Mycostatin 500,000 Units po 4 times daily -Given 1x of Fluconazole 150 mg po   Risks: High risk for decompensation due to suspected acute lupus flare requiring IV  steroids, dehydration with poor oral intake, and multiple comorbidities.  Patient will require least 2 midnights for further evaluation and treatment of present condition.   DVT prophylaxis: Enoxaparin 40 mg sq24h Code Status: FULL CODE  Family Communication: No family present at bedside  Disposition Plan: No family  present at bedside  Consults called: None Admission status: Obs Telemetry    Objective: Vitals:   03/19/19 2025 03/20/19 0339 03/20/19 0618 03/20/19 0729  BP: (!) 119/54 134/87 (!) 134/98   Pulse: (!) 122 (!) 121 (!) 118   Resp: 14  20   Temp: 98.5 F (36.9 C) 98.4 F (36.9 C) 98.1 F (36.7 C)   TempSrc: Oral Oral Oral   SpO2: 94%  100%   Weight:    54.8 kg  Height:        Intake/Output Summary (Last 24 hours) at 03/20/2019 1051 Last data filed at 03/20/2019 1038 Gross per 24 hour  Intake 1163.18 ml  Output 615 ml  Net 548.18 ml   Filed Weights   03/19/19 0747 03/20/19 0729  Weight: 54.4 kg 54.8 kg    Exam:   General: 23 y.o. year-old female well developed well nourished in no acute distress.  Alert and oriented x3.  Cardiovascular: Regular rate and rhythm with no rubs or gallops.  No thyromegaly or JVD noted.    Respiratory: Clear to auscultation with no wheezes or rales. Good inspiratory effort.  Abdomen: Soft nontender nondistended with normal bowel sounds x4 quadrants.  Musculoskeletal: Trace lower extremity edema. 2/4 pulses in all 4 extremities.  Psychiatry: Mood is appropriate for condition and setting   Data Reviewed: CBC: Recent Labs  Lab 03/19/19 0822 03/20/19 0500  WBC 4.9 4.2  NEUTROABS 4.1  --   HGB 11.4* 10.6*  HCT 36.0 33.6*  MCV 90.9 90.6  PLT 157 672*   Basic Metabolic Panel: Recent Labs  Lab 03/19/19 0822 03/20/19 0500  NA 135 136  K 4.3 4.0  CL 105 112*  CO2 19* 18*  GLUCOSE 94 83  BUN 12 9  CREATININE 0.71 0.59  CALCIUM 8.5* 7.7*  MG  --  1.2*  PHOS  --  3.7   GFR: Estimated Creatinine Clearance: 85.8 mL/min (by C-G formula based on SCr of 0.59 mg/dL). Liver Function Tests: Recent Labs  Lab 03/19/19 0822 03/20/19 0500  AST 68* 63*  ALT 31 25  ALKPHOS 120 95  BILITOT 0.3 0.5  PROT 7.6 6.8  ALBUMIN 2.7* 2.2*   No results for input(s): LIPASE, AMYLASE in the last 168 hours. No results for input(s): AMMONIA in  the last 168 hours. Coagulation Profile: No results for input(s): INR, PROTIME in the last 168 hours. Cardiac Enzymes: No results for input(s): CKTOTAL, CKMB, CKMBINDEX, TROPONINI in the last 168 hours. BNP (last 3 results) No results for input(s): PROBNP in the last 8760 hours. HbA1C: Recent Labs    03/20/19 0500  HGBA1C 4.5*   CBG: Recent Labs  Lab 03/20/19 0741 03/20/19 0836 03/20/19 1004  GLUCAP 68* 71 90   Lipid Profile: No results for input(s): CHOL, HDL, LDLCALC, TRIG, CHOLHDL, LDLDIRECT in the last 72 hours. Thyroid Function Tests: Recent Labs    03/19/19 1549  TSH 3.623   Anemia Panel: No results for input(s): VITAMINB12, FOLATE, FERRITIN, TIBC, IRON, RETICCTPCT in the last 72 hours. Urine analysis:    Component Value Date/Time   COLORURINE YELLOW 03/19/2019 0850   APPEARANCEUR CLEAR 03/19/2019 0850   LABSPEC 1.014 03/19/2019 0850   PHURINE 6.0  03/19/2019 0850   GLUCOSEU NEGATIVE 03/19/2019 0850   HGBUR SMALL (A) 03/19/2019 0850   BILIRUBINUR NEGATIVE 03/19/2019 0850   BILIRUBINUR small 08/30/2018 [place] 03/19/2019 0850   PROTEINUR 100 (A) 03/19/2019 0850   UROBILINOGEN 0.2 08/30/2018 0953   NITRITE NEGATIVE 03/19/2019 0850   LEUKOCYTESUR NEGATIVE 03/19/2019 0850   Sepsis Labs: @LABRCNTIP (procalcitonin:4,lacticidven:4)  ) Recent Results (from the past 240 hour(s))  Culture, blood (Routine x 2)     Status: None (Preliminary result)   Collection Time: 03/19/19  8:08 AM   Specimen: BLOOD  Result Value Ref Range Status   Specimen Description   Final    BLOOD RIGHT ANTECUBITAL Performed at [facility], [place] [address] [place], [place] [address]    Special Requests   Final    BOTTLES DRAWN AEROBIC AND ANAEROBIC Blood Culture adequate volume Performed at [place] [address] [place], [place] [address]    Culture PENDING  Incomplete   Report Status PENDING  Incomplete  SARS Coronavirus 2  (CEPHEID- Performed in [facility] hospital lab), Hosp Order     Status: None   Collection Time: 03/19/19  8:50 AM   Specimen: Nasopharyngeal Swab  Result Value Ref Range Status   SARS Coronavirus 2 NEGATIVE NEGATIVE Final    Comment: (NOTE) If result is NEGATIVE SARS-CoV-2 target nucleic acids are NOT DETECTED. The SARS-CoV-2 RNA is generally detectable in upper and lower  respiratory specimens during the acute phase of infection. The lowest  concentration of SARS-CoV-2 viral copies this assay can detect is 250  copies / mL. A negative result does not preclude SARS-CoV-2 infection  and should not be used as the sole basis for treatment or other  patient management decisions.  A negative result may occur with  improper specimen collection / handling, submission of specimen other  than nasopharyngeal swab, presence of viral mutation(s) within the  areas targeted by this assay, and inadequate number of viral copies  (<250 copies / mL). A negative result must be combined with clinical  observations, patient history, and epidemiological information. If result is POSITIVE SARS-CoV-2 target nucleic acids are DETECTED. The SARS-CoV-2 RNA is generally detectable in upper and lower  respiratory specimens dur ing the acute phase of infection.  Positive  results are indicative of active infection with SARS-CoV-2.  Clinical  correlation with patient history and other diagnostic information is  necessary to determine patient infection status.  Positive results do  not rule out bacterial infection or co-infection with other viruses. If result is PRESUMPTIVE POSTIVE SARS-CoV-2 nucleic acids MAY BE PRESENT.   A presumptive positive result was obtained on the submitted specimen  and confirmed on repeat testing.  While 2019 novel coronavirus  (SARS-CoV-2) nucleic acids may be present in the submitted sample  additional confirmatory testing may be necessary for epidemiological  and / or clinical  management purposes  to differentiate between  SARS-CoV-2 and other Sarbecovirus currently known to infect humans.  If clinically indicated additional testing with an alternate test  methodology [phone]) is advised. The SARS-CoV-2 RNA is generally  detectable in upper and lower respiratory sp ecimens during the acute  phase of infection. The expected result is Negative. Fact Sheet for Patients:  StrictlyIdeas.no Fact Sheet for Healthcare Providers: BankingDealers.co.za This test is not yet approved or cleared by the [place] FDA and has been authorized for detection and/or diagnosis of SARS-CoV-2 by FDA under an Emergency Use Authorization (EUA).  This EUA  will remain in effect (meaning this test can be used) for the duration of the COVID-19 declaration under Section 564(b)(1) of the Act, 21 U.S.C. section 360bbb-3(b)(1), unless the authorization is terminated or revoked sooner. Performed at [facility], [place] [address] [place], [place] [address]       Studies: No results found.  Scheduled Meds:  acetaminophen  650 mg Oral Once   amLODipine  7.5 mg Oral Daily   amoxicillin-clavulanate  1 tablet Oral Q12H   cholecalciferol  1,000 Units Oral Daily   DULoxetine  60 mg Oral Daily   enoxaparin (LOVENOX) injection  40 mg Subcutaneous Q24H   ferrous sulfate  325 mg Oral BID   folic acid  1 mg Oral QHS   folic acid  2 mg Oral Daily   guaiFENesin  1,200 mg Oral BID   nystatin  5 mL Oral QID   predniSONE  20 mg Oral Q breakfast   Vitamin D (Ergocalciferol)  50,000 Units Oral Once per day on Tue Thu    Continuous Infusions:  sodium chloride 75 mL/hr at 03/20/19 0345     LOS: 1 day     Kayleen Memos, MD [facility] Pager [phone]  If 7PM-7AM, please contact night-coverage [email or web address] Password [facility] 03/20/2019, 10:51 AM

## 2019-03-20 NOTE — Progress Notes (Signed)
Patient unable to void since 1200pm today. Bladder scanner done with amount of 280 noted to be withheld. In/Out cath done with the amount of 564ml removed. Patient has relief, post void amount of 0.  MD notified and will continue to monitor patient closely.

## 2019-03-21 LAB — URINE CULTURE: Culture: NO GROWTH

## 2019-03-21 LAB — GLUCOSE, CAPILLARY: Glucose-Capillary: 89 mg/dL (ref 70–99)

## 2019-03-21 LAB — PROCALCITONIN: Procalcitonin: 0.14 ng/mL

## 2019-03-21 LAB — LACTIC ACID, PLASMA: Lactic Acid, Venous: 1.5 mmol/L (ref 0.5–1.9)

## 2019-03-21 MED ORDER — METOPROLOL TARTRATE 25 MG PO TABS
25.0000 mg | ORAL_TABLET | Freq: Two times a day (BID) | ORAL | Status: DC
  Administered 2019-03-21 – 2019-03-22 (×3): 25 mg via ORAL
  Filled 2019-03-21 (×4): qty 1

## 2019-03-21 NOTE — TOC Initial Note (Signed)
Transition of Care [facility]) - Initial/Assessment Note    Patient Details  Name: [name] [name] MRN: [id number] Date of Birth: 1996/07/01  [facility]) CM/SW Contact:    Nila Nephew, LCSW Phone Number: 03/21/2019, 10:33 AM  Clinical Narrative:   High readmission risk screening below due to score of 24%                       Activities of Daily Living Home Assistive Devices/Equipment: None ADL Screening (condition at time of admission) Patient's cognitive ability adequate to safely complete daily activities?: Yes Is the patient deaf or have difficulty hearing?: No Does the patient have difficulty seeing, even when wearing glasses/contacts?: No Does the patient have difficulty concentrating, remembering, or making decisions?: No Patient able to express need for assistance with ADLs?: Yes Does the patient have difficulty dressing or bathing?: No Independently performs ADLs?: Yes (appropriate for developmental age) Does the patient have difficulty walking or climbing stairs?: No Weakness of Legs: Both Weakness of Arms/Hands: Both   Admission diagnosis:  Viral syndrome [B34.9] SLE exacerbation ([place]) [M32.9] Dysphagia, unspecified type [R13.10] Lupus (systemic lupus erythematosus) ([place]) [M32.9] Patient Active Problem List   Diagnosis Date Noted  . Lupus (systemic lupus erythematosus) ([place]) 03/19/2019  . Bronchitis 03/19/2019  . Essential hypertension 03/19/2019  . Elevated AST (SGOT) 03/19/2019  . Dehydration 03/19/2019  . Sinus tachycardia 03/19/2019  . Metabolic acidosis 16/09/930  . Oral thrush 03/19/2019  . Relative acute adrenal insufficiency ([place]) 06/11/2018  . Anemia 06/09/2018  . Lobar pneumonia ([place]) 06/07/2018  . Pleuritic chest pain 06/05/2018  . Dysphagia 06/01/2018  . Odynophagia 06/01/2018  . Thrush 06/01/2018  . Mucositis oral 01/12/2018  . Vaginal candidiasis 01/12/2018  . Methotrexate toxicity   . Mouth ulcers   . Leukopenia 01/10/2018  .  Accidental methotrexate overdose 01/08/2018  . Depression 02/08/2017  . Primary osteoarthritis of both knees 11/09/2016  . Chronic midline low back pain without sciatica 11/09/2016  . Vitamin D deficiency 11/09/2016  . SLE (systemic lupus erythematosus) ([place]) 09/19/2016  . Insomnia 01/29/2016  . Depo-Provera contraceptive status 08/11/2015  . Raynaud's disease 08/11/2015  . Seasonal allergies 08/11/2015  . IDA (iron deficiency anemia) 08/11/2015   PCP:  [name], FNP Pharmacy:   [id number] - [place], [place] [address] Phone: [phone] Fax: [phone]  [id number] - [place], [place] [address]  [place] [address] Phone: [phone] Fax: [phone]     Social Determinants of Health (SDOH) Interventions    Readmission Risk Interventions Readmission Risk Prevention Plan 03/21/2019  Transportation Screening Complete  PCP or Specialist Appt within 3-5 Days Not Complete  Not Complete comments pt is established with PCP however DC date unknown  [place] or [place] Not Complete  HRI or Home Care Consult comments no indication  Social Work Consult for [place] Planning/Counseling Complete  Palliative Care Screening Not Applicable  Medication Review Press photographer) Complete  Some recent data might be hidden

## 2019-03-21 NOTE — Progress Notes (Signed)
Spoke with patient's mother and gave her update on the patient's condition and plan of care. Patient's mother reported that she is concerned about the patient's poor appetite/oral intake. States the patient has lost about 60 lbs in the last 5 months. Left a physician sticky note for the MD about her concerns.   Nayeliz Hipp, [place] 03/21/2019 1:56 PM

## 2019-03-21 NOTE — Progress Notes (Signed)
PROGRESS NOTE  [name] [name]:[id number] DOB: 27-Jun-1996 DOA: 03/19/2019 PCP: [name], FNP  HPI/Recap of past 24 hours: [name] [name] is a 23 y.o. female with medical history significant for but not limited to Gout, HTN, Lupus, Raynaud's disease and other comorbidites who presents with a  CC of 3 day cough and worsening LE Swelling. Also states that she felt weak and dehydrated and states she has had productive cough. Admits to feeling tighter in her extremities typical for a lupus flare. Also states that she feels as if it is more difficult to swallow because she feels like she has thrush. Denies any CP or SOB. No nausea or vomiting. Also says that she has not been eating well at all due to Nausea and inability to keep foods down. States she has been coughing and has been trying to hydrate but does not feel like she has done an adequate job. No other concerns or complaints at this Time but states she has been weaker. [facility] was asked to admit this patient for Cough and Leg swelling as well as decreased po intake.   ED Course: In the ED she was given IVF Boluses (2 Liters) basic blood work done and a CXR. Also had Blood Cultures, Urinalysis, TSH, EKG done. SARS-CoV-2 Negative.   03/20/19: Patient was seen and examined at her bedside this morning.  She reports her cough is improving.  Still has poor appetite and feels weak.  States her symptoms are similar to prior lupus flare.  Follows with Dr. [name] at [facility].  03/21/19: Patient seen and examined at her bedside.  Reports pain in her lower extremities bilaterally with swelling.  No erythema.  Will obtain Doppler ultrasound.  She denies chest pain or shortness of breath. Intermittent SVT for which she was started on metoprolol.  States she has had a fast heart rate for a while.  Assessment/Plan: Active Problems:   Raynaud's disease   Seasonal allergies   IDA (iron deficiency anemia)   SLE (systemic lupus erythematosus) (HCC)   Depression   Thrush   Lupus (systemic lupus erythematosus) (HCC)   Bronchitis   Essential hypertension   Elevated AST (SGOT)   Dehydration   Sinus tachycardia   Metabolic acidosis   Oral thrush  Suspected lupus flare Persistent symptomatology generalized weakness, bilateral lower extremity edema, poor appetite and oral intake Allergy to hydroxychloroquine On Prograf, restart Continue folic acid Continue IV steroids  Optimize pain control Called office of Dr. [name] at [facility] on 03/20/2019, no answer.  Generalized fatigue/arthralgia likely secondary to suspected lupus flare Restart Prograf Continue IV steroids Optimize pain control  Acute bronchitis Independently reviewed chest x-ray done on admission which showed no lobular infiltrates COVID-19 testing negative Continue Augmentin twice daily x5 days Continue symptomatic care  SVT Asymptomatic States she has had a fast heart rate TSH normal Started on Lopressor 25 mg twice daily Continue to closely monitor vital signs  Bilateral lower extremity edema and tenderness No erythema or warmth to suggest cellulitis Obtain Doppler ultrasound bilaterally to rule out DVT  Raynaud's syndrome/hypertension Continue amlodipine increased dose to 10 mg p.o. daily  Severe hypomagnesemia Magnesium 1.2 Repleted with IV magnesium 4 g x 2.  Acute dehydration secondary to poor oral intake Continue gentle IV fluid hydration normal saline at 75 cc/h Monitor urine output  Acute urinary retention Per nursing staff feeling of urgency and frequency but unable to void Bladder scan revealed 350 cc Straight in and out catheter, 600  cc of urine obtained. Continue to closely monitor urine output  Sinus tachycardia, possibly from dehydration Obtain twelve-lead EKG Denies chest pain Lactic acid 1.0 Procalcitonin 0.11  Moderate protein calorie malnutrition Albumin 2.2 BMI 22 Encourage increasing oral protein calorie intake  Non-anion gap  metabolic acidosis Bicarb 18 Anion gap 6  Elevated AST -AST on Admission was 68 -C/w IVF Hydration -Continue to Monitor and Trend Hepatic Fxn Panel -If consistently elevated Will need RUQ U/S and Acute Hepatitis Panel in the AM -Repeat CMP in AM   Oral Thrush -In the setting of Chronic Steroid Use -Continue Mycostatin 500,000 Units po 4 times daily -Given 1x of Fluconazole 150 mg po   Risks: High risk for decompensation due to suspected acute lupus flare requiring IV steroids, dehydration with poor oral intake, and multiple comorbidities.  Patient will require least 2 midnights for further evaluation and treatment of present condition.   DVT prophylaxis: Enoxaparin 40 mg sq24h Code Status: FULL CODE  Family Communication: No family present at bedside  Disposition Plan:  Possible discharge in 1 to 2 days when symptomatology has improved. Consults called: None   Objective: Vitals:   03/21/19 0430 03/21/19 0523 03/21/19 0633 03/21/19 0839  BP: (!) 134/110 (!) 130/106 (!) 119/92 (!) 135/107  Pulse: (!) 116 (!) 104 97 99  Resp:    18  Temp:    98.2 F (36.8 C)  TempSrc:    Oral  SpO2:    98%  Weight: 55 kg     Height:        Intake/Output Summary (Last 24 hours) at 03/21/2019 1205 Last data filed at 03/21/2019 0945 Gross per 24 hour  Intake 2131.31 ml  Output 2802 ml  Net -670.69 ml   Filed Weights   03/19/19 0747 03/20/19 0729 03/21/19 0430  Weight: 54.4 kg 54.8 kg 55 kg    Exam:  . General: 23 y.o. year-old female well-developed well-nourished in no acute distress.  Alert and oriented x3.   . Cardiovascular: Regular rate and rhythm with no rubs or gallops.  No JVD or thyromegaly.   Marland Kitchen Respiratory: Clear to auscultation with no wheezes or rales.  Good inspiratory effort. . Abdomen: Soft nontender nondistended normal bowel sounds. . Musculoskeletal: Trace lower extremity edema in lower extremities bilaterally with tenderness on palpation particularly in the calf area.  Marland Kitchen Psychiatry: Mood is appropriate for condition and setting.  Data Reviewed: CBC: Recent Labs  Lab 03/19/19 0822 03/20/19 0500  WBC 4.9 4.2  NEUTROABS 4.1  --   HGB 11.4* 10.6*  HCT 36.0 33.6*  MCV 90.9 90.6  PLT 157 462*   Basic Metabolic Panel: Recent Labs  Lab 03/19/19 0822 03/20/19 0500  NA 135 136  K 4.3 4.0  CL 105 112*  CO2 19* 18*  GLUCOSE 94 83  BUN 12 9  CREATININE 0.71 0.59  CALCIUM 8.5* 7.7*  MG  --  1.2*  PHOS  --  3.7   GFR: Estimated Creatinine Clearance: 85.8 mL/min (by C-G formula based on SCr of 0.59 mg/dL). Liver Function Tests: Recent Labs  Lab 03/19/19 0822 03/20/19 0500  AST 68* 63*  ALT 31 25  ALKPHOS 120 95  BILITOT 0.3 0.5  PROT 7.6 6.8  ALBUMIN 2.7* 2.2*   No results for input(s): LIPASE, AMYLASE in the last 168 hours. No results for input(s): AMMONIA in the last 168 hours. Coagulation Profile: No results for input(s): INR, PROTIME in the last 168 hours. Cardiac Enzymes: No results for input(s):  CKTOTAL, CKMB, CKMBINDEX, TROPONINI in the last 168 hours. BNP (last 3 results) No results for input(s): PROBNP in the last 8760 hours. HbA1C: Recent Labs    03/20/19 0500  HGBA1C 4.5*   CBG: Recent Labs  Lab 03/20/19 0741 03/20/19 0836 03/20/19 1004 03/21/19 0756  GLUCAP 68* 71 90 89   Lipid Profile: No results for input(s): CHOL, HDL, LDLCALC, TRIG, CHOLHDL, LDLDIRECT in the last 72 hours. Thyroid Function Tests: Recent Labs    03/19/19 1549  TSH 3.623   Anemia Panel: No results for input(s): VITAMINB12, FOLATE, FERRITIN, TIBC, IRON, RETICCTPCT in the last 72 hours. Urine analysis:    Component Value Date/Time   COLORURINE YELLOW 03/19/2019 0850   APPEARANCEUR CLEAR 03/19/2019 0850   LABSPEC 1.014 03/19/2019 0850   PHURINE 6.0 03/19/2019 0850   GLUCOSEU NEGATIVE 03/19/2019 0850   HGBUR SMALL (A) 03/19/2019 0850   BILIRUBINUR NEGATIVE 03/19/2019 0850   BILIRUBINUR small 08/30/2018 0953   KETONESUR NEGATIVE  03/19/2019 0850   PROTEINUR 100 (A) 03/19/2019 0850   UROBILINOGEN 0.2 08/30/2018 0953   NITRITE NEGATIVE 03/19/2019 0850   LEUKOCYTESUR NEGATIVE 03/19/2019 0850   Sepsis Labs: @LABRCNTIP (procalcitonin:4,lacticidven:4)  ) Recent Results (from the past 240 hour(s))  Culture, blood (Routine x 2)     Status: None (Preliminary result)   Collection Time: 03/19/19  8:08 AM   Specimen: BLOOD  Result Value Ref Range Status   Specimen Description   Final    BLOOD RIGHT ANTECUBITAL Performed at [facility], [place] [address] [place], [place] [address]    Special Requests   Final    BOTTLES DRAWN AEROBIC AND ANAEROBIC Blood Culture adequate volume Performed at [place] [address], [place], [place] [address]    Culture   Final    NO GROWTH 2 DAYS Performed at [place] [address] [place],  [address]    Report Status PENDING  Incomplete  SARS Coronavirus 2 (CEPHEID- Performed in [place] hospital lab), Hosp Order     Status: None   Collection Time: 03/19/19  8:50 AM   Specimen: Nasopharyngeal Swab  Result Value Ref Range Status   SARS Coronavirus 2 NEGATIVE NEGATIVE Final    Comment: (NOTE) If result is NEGATIVE SARS-CoV-2 target nucleic acids are NOT DETECTED. The SARS-CoV-2 RNA is generally detectable in upper and lower  respiratory specimens during the acute phase of infection. The lowest  concentration of SARS-CoV-2 viral copies this assay can detect is 250  copies / mL. A negative result does not preclude SARS-CoV-2 infection  and should not be used as the sole basis for treatment or other  patient management decisions.  A negative result may occur with  improper specimen collection / handling, submission of specimen other  than nasopharyngeal swab, presence of viral mutation(s) within the  areas targeted by this assay, and inadequate number of viral copies  (<250 copies / mL). A negative result must be combined with  clinical  observations, patient history, and epidemiological information. If result is POSITIVE SARS-CoV-2 target nucleic acids are DETECTED. The SARS-CoV-2 RNA is generally detectable in upper and lower  respiratory specimens dur ing the acute phase of infection.  Positive  results are indicative of active infection with SARS-CoV-2.  Clinical  correlation with patient history and other diagnostic information is  necessary to determine patient infection status.  Positive results do  not rule out bacterial infection or co-infection with other viruses. If result is PRESUMPTIVE POSTIVE  SARS-CoV-2 nucleic acids MAY BE PRESENT.   A presumptive positive result was obtained on the submitted specimen  and confirmed on repeat testing.  While 2019 novel coronavirus  (SARS-CoV-2) nucleic acids may be present in the submitted sample  additional confirmatory testing may be necessary for epidemiological  and / or clinical management purposes  to differentiate between  SARS-CoV-2 and other Sarbecovirus currently known to infect humans.  If clinically indicated additional testing with an alternate test  methodology [phone]) is advised. The SARS-CoV-2 RNA is generally  detectable in upper and lower respiratory sp ecimens during the acute  phase of infection. The expected result is Negative. Fact Sheet for Patients:  StrictlyIdeas.no Fact Sheet for Healthcare Providers: BankingDealers.co.za This test is not yet approved or cleared by the [place] FDA and has been authorized for detection and/or diagnosis of SARS-CoV-2 by FDA under an Emergency Use Authorization (EUA).  This EUA will remain in effect (meaning this test can be used) for the duration of the COVID-19 declaration under Section 564(b)(1) of the Act, 21 U.S.C. section 360bbb-3(b)(1), unless the authorization is terminated or revoked sooner. Performed at [facility],  [place] [address], [place], [place] [address]   Urine culture     Status: None   Collection Time: 03/19/19  3:15 PM   Specimen: Urine, Random  Result Value Ref Range Status   Specimen Description   Final    URINE, RANDOM Performed at [place] [address], [place], [place] [address]    Special Requests   Final    NONE Performed at [facility], [place] [address] [place], [place] [address]    Culture   Final    NO GROWTH Performed at [facility], [place] [address], [place], [place] [address]    Report Status 03/21/2019 FINAL  Final      Studies: No results found.  Scheduled Meds: . acetaminophen  650 mg Oral Once  . amLODipine  10 mg Oral Daily  . amoxicillin-clavulanate  1 tablet Oral Q12H  . bethanechol  5 mg Oral TID  . cholecalciferol  1,000 Units Oral Daily  . DULoxetine  60 mg Oral Daily  . enoxaparin (LOVENOX) injection  40 mg Subcutaneous Q24H  . ferrous sulfate  325 mg Oral BID  . folic acid  1 mg Oral QHS  . folic acid  2 mg Oral Daily  . guaiFENesin  1,200 mg Oral BID  . methylPREDNISolone (SOLU-MEDROL) injection  40 mg Intravenous Daily  . metoprolol tartrate  25 mg Oral BID  . nystatin  5 mL Oral QID  . pantoprazole  20 mg Oral QHS  . senna-docusate  2 tablet Oral QHS  . Vitamin D (Ergocalciferol)  50,000 Units Oral Once per day on Tue Thu    Continuous Infusions: . dextrose 5 % and 0.45% NaCl 75 mL/hr at 03/21/19 0445     LOS: 2 days     Kayleen Memos, MD [facility] Pager [phone]  If 7PM-7AM, please contact night-coverage [email or web address] Password [email or web address] 03/21/2019, 12:05 PM

## 2019-03-21 NOTE — Progress Notes (Signed)
Discussed with Dr. [name] via phone on 03/21/19, the patient's rheumatologist at [facility]. Prograf was started by her nephrologist due to lupus nephritis. She was on metotrexate previously, stopped due to nephritis. He last saw her on 10/2018 and he had her on cellcept 1000 mg BID and prednisone for her lupus. Not on plaquenil due to allergy.  He recommended to continue IV steroids and once improved to taper down steroids and have close follow up with him outpatient in 1-2 days.   Will obtain prograf level. Ok to restart it along with Cellcept once an active infective process has been ruled out.  Patient will need to call Dr. Valentina Lucks office and make a follow up appointment when close to discharge.

## 2019-03-21 NOTE — Progress Notes (Addendum)
Pt has had good urine OP this shift with 2000 mls so far. Pt BP 134/110 and MAP of 118 manually. HR 116 PRN 2.5 mg of Metoprolol given @ 0440. At 0522 BP 130/106 manually. MAP 114. HR 104. On call notified. New order for 25 Mg PO metoprolol 2 X daily with dose to be given @ 0530.  At 0633 BP 119/92,  MAP 101 and HR 97.

## 2019-03-22 ENCOUNTER — Inpatient Hospital Stay (HOSPITAL_COMMUNITY): Payer: BC Managed Care – PPO

## 2019-03-22 DIAGNOSIS — R609 Edema, unspecified: Secondary | ICD-10-CM

## 2019-03-22 LAB — BASIC METABOLIC PANEL
Anion gap: 11 (ref 5–15)
BUN: 12 mg/dL (ref 6–20)
CO2: 18 mmol/L — ABNORMAL LOW (ref 22–32)
Calcium: 8.4 mg/dL — ABNORMAL LOW (ref 8.9–10.3)
Chloride: 109 mmol/L (ref 98–111)
Creatinine, Ser: 0.54 mg/dL (ref 0.44–1.00)
GFR calc Af Amer: >60 mL/min (ref >60)
GFR calc non Af Amer: >60 mL/min (ref >60)
Glucose, Bld: 88 mg/dL (ref 70–99)
Potassium: 3.7 mmol/L (ref 3.5–5.1)
Sodium: 138 mmol/L (ref 135–145)

## 2019-03-22 LAB — MAGNESIUM: Magnesium: 2 mg/dL (ref 1.7–2.4)

## 2019-03-22 LAB — GLUCOSE, CAPILLARY
Glucose-Capillary: 39 mg/dL — CL (ref 70–99)
Glucose-Capillary: 81 mg/dL (ref 70–99)
Glucose-Capillary: 81 mg/dL (ref 70–99)
Glucose-Capillary: 109 mg/dL — ABNORMAL HIGH (ref 70–99)

## 2019-03-22 MED ORDER — TACROLIMUS 1 MG PO CAPS
1.0000 mg | ORAL_CAPSULE | Freq: Two times a day (BID) | ORAL | Status: DC
  Administered 2019-03-22 – 2019-03-25 (×6): 1 mg via ORAL
  Filled 2019-03-22 (×7): qty 1

## 2019-03-22 MED ORDER — MYCOPHENOLATE MOFETIL 250 MG PO CAPS
1000.0000 mg | ORAL_CAPSULE | Freq: Two times a day (BID) | ORAL | Status: DC
  Administered 2019-03-22: 11:00:00 1000 mg via ORAL
  Administered 2019-03-23: 750 mg via ORAL
  Administered 2019-03-23: 1000 mg via ORAL
  Filled 2019-03-22 (×7): qty 4

## 2019-03-22 MED ORDER — BISMUTH SUBSALICYLATE 262 MG PO CHEW
524.0000 mg | CHEWABLE_TABLET | Freq: Three times a day (TID) | ORAL | Status: AC
  Administered 2019-03-22: 524 mg via ORAL
  Filled 2019-03-22 (×4): qty 2

## 2019-03-22 MED ORDER — ALUM & MAG HYDROXIDE-SIMETH 200-200-20 MG/5ML PO SUSP
30.0000 mL | ORAL | Status: DC | PRN
  Administered 2019-03-22: 30 mL via ORAL
  Filled 2019-03-22: qty 30

## 2019-03-22 NOTE — Progress Notes (Addendum)
Patient reports that she continues to have abdominal cramping and has had 3 episodes of diarrhea today. She states that the [place] did help her cramping for a little while, but she is refusing her evening dose and her other evening meds stating she just wants to sleep. Patient has had very little PO intake today. She only took sips of an Ensure after she did not eat breakfast. Reports that she ate her sides at lunch but this was not visualized by the RN. Will continue to encourage PO intake.      Patient stated that she did not want the RN to call her mother with an update today.   [name] [name], Fraser Din 03/22/2019 5:52 PM

## 2019-03-22 NOTE — Progress Notes (Signed)
Hypoglycemic Event  CBG: 39  Treatment: 4 oz juice/soda  Symptoms: None  Follow-up CBG: Time:0807 CBG Result:81  Possible Reasons for Event: Inadequate meal intake  Comments/MD notified: Dr. Moss Mc, Fraser Din

## 2019-03-22 NOTE — Progress Notes (Signed)
PROGRESS NOTE  [name] [name] QJF:[id number] DOB: 08/31/1996 DOA: 03/19/2019 PCP: [name], FNP  HPI/Recap of past 24 hours: [name] [name] is a 23 y.o. female with medical history significant for but not limited to Gout, HTN, Lupus, Raynaud's disease and other comorbidites who presents with a  CC of 3 day cough and worsening LE Swelling. Also states that she felt weak and dehydrated and states she has had productive cough. Admits to feeling tighter in her extremities typical for a lupus flare. Also states that she feels as if it is more difficult to swallow because she feels like she has thrush. Denies any CP or SOB. No nausea or vomiting. Also says that she has not been eating well at all due to Nausea and inability to keep foods down. States she has been coughing and has been trying to hydrate but does not feel like she has done an adequate job. No other concerns or complaints at this Time but states she has been weaker. [facility] was asked to admit this patient for Cough and Leg swelling as well as decreased po intake.   ED Course: In the ED she was given IVF Boluses (2 Liters) basic blood work done and a CXR. Also had Blood Cultures, Urinalysis, TSH, EKG done. SARS-CoV-2 Negative.   Poor appetite and feels weak.  States her symptoms are similar to prior lupus flare.    Discussed with Dr. [name] via phone on 03/21/19, the patient's rheumatologist at [facility]. Prograf was started by her nephrologist due to lupus nephritis. She was on metotrexate previously, stopped due to nephritis. He last saw her on 10/2018 and he had her on cellcept 1000 mg BID and prednisone for her lupus. Not on plaquenil due to allergy.  He recommended to continue IV steroids and once improved to taper down steroids and have close follow up with him outpatient in 1-2 days.   Will obtain prograf level. Ok to restart it along with Cellcept once an active infective process has been ruled out.  Patient will need to call Dr.  Valentina Lucks office and make a follow up appointment when close to discharge.  03/22/19: Patient seen and examined at her bedside.  She reports overnight she had 2 watery stools with abdominal cramping.  Her nausea has resolved.  No vomiting.  Lactic acid and procalcitonin unremarkable this morning afebrile with no leukocytosis.  Restarted Prograf and CellCept as recommended by rheumatologist Dr. [name].  Assessment/Plan: Active Problems:   Raynaud's disease   Seasonal allergies   IDA (iron deficiency anemia)   SLE (systemic lupus erythematosus) (HCC)   Depression   Thrush   Lupus (systemic lupus erythematosus) (HCC)   Bronchitis   Essential hypertension   Elevated AST (SGOT)   Dehydration   Sinus tachycardia   Metabolic acidosis   Oral thrush  Generalized fatigue/arthralgia likely secondary to lupus flare Persistent symptomatology with generalized weakness, arthralgia, lower extremity tenderness on palpation, poor appetite with poor oral intake Negative serum hCG Restart Prograf and CellCept as recommended by her rheumatologist Dr. [name] Renal function appears stable.   Continue IV steroids Solu-Medrol 40 mg daily We will taper down once her symptoms improve  Allergic to hydroxychloroquine  Lupus nephritis Follows with nephrology outpatient Elevated CRP and sed rate Renal function stable Negative HCG Restarted Prograf  Loose stools, unclear etiology 2 episodes overnight Pepto-Bismol for abdominal cramping Afebrile no leukocytosis Treat symptomatically  Acute bronchitis Independently reviewed chest x-ray done on admission which showed no lobular infiltrates  COVID-19 testing negative Continue Augmentin twice daily x5 days Continue symptomatic care Avoid IV dye due to hx of lupus nephritis  Resolving SVT Asymptomatic States she has had a fast heart rate TSH normal Continue Lopressor 25 mg twice daily Continue to closely monitor vital signs  Bilateral lower extremity  edema and tenderness No erythema or warmth to suggest cellulitis Obtain Doppler ultrasound bilaterally to rule out DVT  Raynaud's syndrome/hypertension Continue amlodipine increased dose to 10 mg p.o. daily  Severe hypomagnesemia Magnesium 1.2 Repleted with IV magnesium 4 g x 2.  Acute dehydration secondary to poor oral intake Continue gentle IV fluid hydration normal saline at 75 cc/h Monitor urine output  Acute urinary retention Per nursing staff feeling of urgency and frequency but unable to void Bladder scan revealed 350 cc Straight in and out catheter, 600 cc of urine obtained. Continue to closely monitor urine output  Resolved sinus tachycardia, possibly from dehydration Denies chest pain Lactic acid 1.0 Procalcitonin 0.11  Moderate protein calorie malnutrition Albumin 2.2 BMI 22 Encourage increasing oral protein calorie intake  Non-anion gap metabolic acidosis Bicarb 18 Anion gap 6  Elevated AST -AST on Admission was 68 -C/w IVF Hydration -Continue to Monitor and Trend Hepatic Fxn Panel -If consistently elevated Will need RUQ U/S and Acute Hepatitis Panel in the AM -Repeat CMP in AM   Oral Thrush -In the setting of Chronic Steroid Use -Continue Mycostatin 500,000 Units po 4 times daily -Given 1x of Fluconazole 150 mg po    DVT prophylaxis: Enoxaparin 40 mg sq24h Code Status: FULL CODE  Family Communication: No family present at bedside  Disposition Plan:  Possible discharge in 1 to 2 days when symptomatology has improved. Consults called: None   Objective: Vitals:   03/22/19 0031 03/22/19 0042 03/22/19 0626 03/22/19 0648  BP: 121/76  (!) 134/109 (!) 120/93  Pulse: (!) 108  97 96  Resp:  16 20 18   Temp: 98.7 F (37.1 C)  97.8 F (36.6 C)   TempSrc: Oral  Oral   SpO2: 100%  100% 99%  Weight:   56.3 kg   Height:        Intake/Output Summary (Last 24 hours) at 03/22/2019 0836 Last data filed at 03/22/2019 0145 Gross per 24 hour  Intake  2274.69 ml  Output 1450 ml  Net 824.69 ml   Filed Weights   03/20/19 0729 03/21/19 0430 03/22/19 0626  Weight: 54.8 kg 55 kg 56.3 kg    Exam:  . General: 23 y.o. year-old female well-developed well-nourished in no acute distress.  Alert and oriented x3. . Cardiovascular: Regular rate and rhythm with no rubs or gallops no JVD or thyromegaly noted.   Marland Kitchen Respiratory: Clear to auscultation with no wheezes or rales.  Poor respiratory effort.. . Abdomen: Mild tenderness diffusely on palpation.  Bowel sounds present. . Musculoskeletal: Trace lower extremity edema bilaterally.  2 out of 4 pulses in all 4 extremities. Marland Kitchen Psychiatry: Mood is appropriate for condition and setting..  Data Reviewed: CBC: Recent Labs  Lab 03/19/19 0822 03/20/19 0500  WBC 4.9 4.2  NEUTROABS 4.1  --   HGB 11.4* 10.6*  HCT 36.0 33.6*  MCV 90.9 90.6  PLT 157 211*   Basic Metabolic Panel: Recent Labs  Lab 03/19/19 0822 03/20/19 0500  NA 135 136  K 4.3 4.0  CL 105 112*  CO2 19* 18*  GLUCOSE 94 83  BUN 12 9  CREATININE 0.71 0.59  CALCIUM 8.5* 7.7*  MG  --  1.2*  PHOS  --  3.7   GFR: Estimated Creatinine Clearance: 85.8 mL/min (by C-G formula based on SCr of 0.59 mg/dL). Liver Function Tests: Recent Labs  Lab 03/19/19 0822 03/20/19 0500  AST 68* 63*  ALT 31 25  ALKPHOS 120 95  BILITOT 0.3 0.5  PROT 7.6 6.8  ALBUMIN 2.7* 2.2*   No results for input(s): LIPASE, AMYLASE in the last 168 hours. No results for input(s): AMMONIA in the last 168 hours. Coagulation Profile: No results for input(s): INR, PROTIME in the last 168 hours. Cardiac Enzymes: No results for input(s): CKTOTAL, CKMB, CKMBINDEX, TROPONINI in the last 168 hours. BNP (last 3 results) No results for input(s): PROBNP in the last 8760 hours. HbA1C: Recent Labs    03/20/19 0500  HGBA1C 4.5*   CBG: Recent Labs  Lab 03/20/19 0836 03/20/19 1004 03/21/19 0756 03/22/19 0734 03/22/19 0807  GLUCAP 71 90 89 39* 81   Lipid  Profile: No results for input(s): CHOL, HDL, LDLCALC, TRIG, CHOLHDL, LDLDIRECT in the last 72 hours. Thyroid Function Tests: Recent Labs    03/19/19 1549  TSH 3.623   Anemia Panel: No results for input(s): VITAMINB12, FOLATE, FERRITIN, TIBC, IRON, RETICCTPCT in the last 72 hours. Urine analysis:    Component Value Date/Time   COLORURINE YELLOW 03/19/2019 0850   APPEARANCEUR CLEAR 03/19/2019 0850   LABSPEC 1.014 03/19/2019 0850   PHURINE 6.0 03/19/2019 0850   GLUCOSEU NEGATIVE 03/19/2019 0850   HGBUR SMALL (A) 03/19/2019 0850   BILIRUBINUR NEGATIVE 03/19/2019 0850   BILIRUBINUR small 08/30/2018 0953   KETONESUR NEGATIVE 03/19/2019 0850   PROTEINUR 100 (A) 03/19/2019 0850   UROBILINOGEN 0.2 08/30/2018 0953   NITRITE NEGATIVE 03/19/2019 0850   LEUKOCYTESUR NEGATIVE 03/19/2019 0850   Sepsis Labs: @LABRCNTIP (procalcitonin:4,lacticidven:4)  ) Recent Results (from the past 240 hour(s))  Culture, blood (Routine x 2)     Status: None (Preliminary result)   Collection Time: 03/19/19  8:08 AM   Specimen: BLOOD  Result Value Ref Range Status   Specimen Description   Final    BLOOD RIGHT ANTECUBITAL Performed at [facility], [place] [address] [place], [place] [address]    Special Requests   Final    BOTTLES DRAWN AEROBIC AND ANAEROBIC Blood Culture adequate volume Performed at [facility] [address], [place], [place] [address]    Culture   Final    NO GROWTH 2 DAYS Performed at [facility] [address] [place], [place] [address]    Report Status PENDING  Incomplete  SARS Coronavirus 2 (CEPHEID- Performed in [place] hospital lab), Hosp Order     Status: None   Collection Time: 03/19/19  8:50 AM   Specimen: Nasopharyngeal Swab  Result Value Ref Range Status   SARS Coronavirus 2 NEGATIVE NEGATIVE Final    Comment: (NOTE) If result is NEGATIVE SARS-CoV-2 target nucleic acids are NOT DETECTED. The SARS-CoV-2 RNA is generally  detectable in upper and lower  respiratory specimens during the acute phase of infection. The lowest  concentration of SARS-CoV-2 viral copies this assay can detect is 250  copies / mL. A negative result does not preclude SARS-CoV-2 infection  and should not be used as the sole basis for treatment or other  patient management decisions.  A negative result may occur with  improper specimen collection / handling, submission of specimen other  than nasopharyngeal swab, presence of viral mutation(s) within the  areas targeted by this assay, and inadequate  number of viral copies  (<250 copies / mL). A negative result must be combined with clinical  observations, patient history, and epidemiological information. If result is POSITIVE SARS-CoV-2 target nucleic acids are DETECTED. The SARS-CoV-2 RNA is generally detectable in upper and lower  respiratory specimens dur ing the acute phase of infection.  Positive  results are indicative of active infection with SARS-CoV-2.  Clinical  correlation with patient history and other diagnostic information is  necessary to determine patient infection status.  Positive results do  not rule out bacterial infection or co-infection with other viruses. If result is PRESUMPTIVE POSTIVE SARS-CoV-2 nucleic acids MAY BE PRESENT.   A presumptive positive result was obtained on the submitted specimen  and confirmed on repeat testing.  While 2019 novel coronavirus  (SARS-CoV-2) nucleic acids may be present in the submitted sample  additional confirmatory testing may be necessary for epidemiological  and / or clinical management purposes  to differentiate between  SARS-CoV-2 and other Sarbecovirus currently known to infect humans.  If clinically indicated additional testing with an alternate test  methodology [phone]) is advised. The SARS-CoV-2 RNA is generally  detectable in upper and lower respiratory sp ecimens during the acute  phase of infection. The  expected result is Negative. Fact Sheet for Patients:  StrictlyIdeas.no Fact Sheet for Healthcare Providers: BankingDealers.co.za This test is not yet approved or cleared by the [place] FDA and has been authorized for detection and/or diagnosis of SARS-CoV-2 by FDA under an Emergency Use Authorization (EUA).  This EUA will remain in effect (meaning this test can be used) for the duration of the COVID-19 declaration under Section 564(b)(1) of the Act, 21 U.S.C. section 360bbb-3(b)(1), unless the authorization is terminated or revoked sooner. Performed at [facility], [place] [address], [place], [place] [address]   Urine culture     Status: None   Collection Time: 03/19/19  3:15 PM   Specimen: Urine, Random  Result Value Ref Range Status   Specimen Description   Final    URINE, RANDOM Performed at [facility] [address] [place], [place] [address]    Special Requests   Final    NONE Performed at [facility], [place] [address], [place], [place] [address]    Culture   Final    NO GROWTH Performed at [facility], [place] [address] [place], [place] [address]    Report Status 03/21/2019 FINAL  Final      Studies: No results found.  Scheduled Meds: . acetaminophen  650 mg Oral Once  . amLODipine  10 mg Oral Daily  . amoxicillin-clavulanate  1 tablet Oral Q12H  . bethanechol  5 mg Oral TID  . bismuth subsalicylate  456 mg Oral TID AC & HS  . cholecalciferol  1,000 Units Oral Daily  . DULoxetine  60 mg Oral Daily  . enoxaparin (LOVENOX) injection  40 mg Subcutaneous Q24H  . ferrous sulfate  325 mg Oral BID  . folic acid  1 mg Oral QHS  . folic acid  2 mg Oral Daily  . guaiFENesin  1,200 mg Oral BID  . methylPREDNISolone (SOLU-MEDROL) injection  40 mg Intravenous Daily  . metoprolol tartrate  25 mg Oral BID  . mycophenolate  1,000 mg Oral BID  . nystatin  5 mL  Oral QID  . pantoprazole  20 mg Oral QHS  . senna-docusate  2 tablet Oral QHS  . tacrolimus  1 mg Oral BID  .  Vitamin D (Ergocalciferol)  50,000 Units Oral Once per day on Tue Thu    Continuous Infusions: . dextrose 5 % and 0.45% NaCl 75 mL/hr at 03/22/19 0349     LOS: 3 days     Kayleen Memos, MD [facility] Pager [phone]  If 7PM-7AM, please contact night-coverage [email or web address] Password [facility] 03/22/2019, 8:36 AM

## 2019-03-22 NOTE — Progress Notes (Signed)
Bilateral lower extremity venous duplex has been completed. Preliminary results can be found in CV Proc through chart review.   03/22/19 8:50 AM [name] [name] RVT

## 2019-03-23 LAB — CBC WITH DIFFERENTIAL/PLATELET
Abs Immature Granulocytes: 0.46 10*3/uL — ABNORMAL HIGH (ref 0.00–0.07)
Basophils Absolute: 0.1 10*3/uL (ref 0.0–0.1)
Basophils Relative: 1 %
Eosinophils Absolute: 0 10*3/uL (ref 0.0–0.5)
Eosinophils Relative: 0 %
HCT: 40.5 % (ref 36.0–46.0)
Hemoglobin: 12.6 g/dL (ref 12.0–15.0)
Immature Granulocytes: 7 %
Lymphocytes Relative: 13 %
Lymphs Abs: 0.9 10*3/uL (ref 0.7–4.0)
MCH: 28 pg (ref 26.0–34.0)
MCHC: 31.1 g/dL (ref 30.0–36.0)
MCV: 90 fL (ref 80.0–100.0)
Monocytes Absolute: 0.7 10*3/uL (ref 0.1–1.0)
Monocytes Relative: 11 %
Neutro Abs: 4.8 10*3/uL (ref 1.7–7.7)
Neutrophils Relative %: 68 %
Platelets: 131 10*3/uL — ABNORMAL LOW (ref 150–400)
RBC: 4.5 MIL/uL (ref 3.87–5.11)
RDW: 15.3 % (ref 11.5–15.5)
WBC: 6.9 10*3/uL (ref 4.0–10.5)
nRBC: 0.3 % — ABNORMAL HIGH (ref 0.0–0.2)

## 2019-03-23 LAB — TACROLIMUS LEVEL: Tacrolimus (FK506) - LabCorp: NOT DETECTED ng/mL (ref 2.0–20.0)

## 2019-03-23 LAB — GLUCOSE, CAPILLARY
Glucose-Capillary: 102 mg/dL — ABNORMAL HIGH (ref 70–99)
Glucose-Capillary: 72 mg/dL (ref 70–99)
Glucose-Capillary: 84 mg/dL (ref 70–99)

## 2019-03-23 LAB — LACTIC ACID, PLASMA
Lactic Acid, Venous: 2.9 mmol/L (ref 0.5–1.9)
Lactic Acid, Venous: 1.8 mmol/L (ref 0.5–1.9)
Lactic Acid, Venous: 2.3 mmol/L (ref 0.5–1.9)

## 2019-03-23 LAB — PROCALCITONIN: Procalcitonin: 0.1 ng/mL

## 2019-03-23 MED ORDER — METOPROLOL TARTRATE 5 MG/5ML IV SOLN
2.5000 mg | Freq: Once | INTRAVENOUS | Status: AC
  Administered 2019-03-23: 2.5 mg via INTRAVENOUS
  Filled 2019-03-23: qty 5

## 2019-03-23 MED ORDER — ENSURE ENLIVE PO LIQD: 237.0000 mL | Freq: Two times a day (BID) | ORAL | Status: DC

## 2019-03-23 MED ORDER — PREDNISONE 20 MG PO TABS: 40.0000 mg | ORAL_TABLET | Freq: Every day | ORAL | Status: DC

## 2019-03-23 MED ORDER — PROMETHAZINE HCL 25 MG/ML IJ SOLN: 12.5000 mg | Freq: Four times a day (QID) | INTRAMUSCULAR | Status: DC | PRN

## 2019-03-23 MED ORDER — TRAMADOL HCL 50 MG PO TABS: 50.0000 mg | ORAL_TABLET | Freq: Two times a day (BID) | ORAL | Status: DC | PRN

## 2019-03-23 MED ORDER — METOPROLOL TARTRATE 5 MG/5ML IV SOLN
2.5000 mg | Freq: Three times a day (TID) | INTRAVENOUS | Status: DC
  Administered 2019-03-23 (×2): 2.5 mg via INTRAVENOUS
  Filled 2019-03-23: qty 5

## 2019-03-23 MED ORDER — HYDROCODONE-ACETAMINOPHEN 5-325 MG PO TABS
1.0000 | ORAL_TABLET | Freq: Four times a day (QID) | ORAL | Status: DC | PRN
  Administered 2019-03-24 – 2019-03-25 (×2): 1 via ORAL
  Filled 2019-03-23 (×2): qty 1

## 2019-03-23 MED ORDER — METOPROLOL TARTRATE 5 MG/5ML IV SOLN
2.5000 mg | Freq: Four times a day (QID) | INTRAVENOUS | Status: DC
  Administered 2019-03-24: 2.5 mg via INTRAVENOUS
  Filled 2019-03-23: qty 5

## 2019-03-23 MED ORDER — METHYLPREDNISOLONE SODIUM SUCC 40 MG IJ SOLR
40.0000 mg | INTRAMUSCULAR | Status: DC
  Administered 2019-03-24 – 2019-03-25 (×2): 40 mg via INTRAVENOUS
  Filled 2019-03-23 (×2): qty 1

## 2019-03-23 MED ORDER — LOPERAMIDE HCL 2 MG PO CAPS
2.0000 mg | ORAL_CAPSULE | ORAL | Status: DC | PRN
  Administered 2019-03-24: 2 mg via ORAL
  Filled 2019-03-23: qty 1

## 2019-03-23 NOTE — Progress Notes (Signed)
Pt attempted to take majority of morning meds, but after 750mg  of the 1000mg  Cellcept , pt began vomiting, with 150mL of green emesis. Solu-Medrol only other medication given this morning. Pt had 2 incontinent episodes, is very lethargic with poor PO intake and increased weakness. Pt also complains of abdominal pain. CBG at noon was 72, with an elevated HR and BP. MD made aware of all of the above and new orders received. Will continue to monitor.

## 2019-03-23 NOTE — Progress Notes (Signed)
PROGRESS NOTE  [name] [name] DJS:[id number] DOB: March 09, 1996 DOA: 03/19/2019 PCP: [name], FNP  HPI/Recap of past 24 hours: [name] [name] is a 23 y.o. female with medical history significant for but not limited to Gout, HTN, Lupus, Raynaud's disease and other comorbidites who presents with a  CC of 3 day cough and worsening LE Swelling. Also states that she felt weak and dehydrated and states she has had productive cough. Admits to feeling tighter in her extremities typical for a lupus flare. Also states that she feels as if it is more difficult to swallow because she feels like she has thrush. Denies any CP or SOB. No nausea or vomiting. Also says that she has not been eating well at all due to Nausea and inability to keep foods down. States she has been coughing and has been trying to hydrate but does not feel like she has done an adequate job. No other concerns or complaints at this Time but states she has been weaker. [facility] was asked to admit this patient for Cough and Leg swelling as well as decreased po intake.   ED Course: In the ED she was given IVF Boluses (2 Liters) basic blood work done and a CXR. Also had Blood Cultures, Urinalysis, TSH, EKG done. SARS-CoV-2 Negative.   Poor appetite and feels weak.  States her symptoms are similar to prior lupus flare.    Discussed with Dr. [name] via phone on 03/21/19, the patient's rheumatologist at [facility]. Prograf was started by her nephrologist due to lupus nephritis. She was on metotrexate previously, stopped due to nephritis. He last saw her on 10/2018 and he had her on cellcept 1000 mg BID and prednisone for her lupus. Not on plaquenil due to allergy.  He recommended to continue IV steroids and once improved to taper down steroids and have close follow up with him outpatient in 1-2 days.   Will obtain prograf level. Ok to restart it along with Cellcept once an active infective process has been ruled out.  Patient will need to call Dr.  Valentina Lucks office and make a follow up appointment when close to discharge.  03/22/19: Patient seen and examined at her bedside.  She reports overnight she had 2 watery stools with abdominal cramping.  Her nausea has resolved.  No vomiting.  Lactic acid and procalcitonin unremarkable this morning afebrile with no leukocytosis.  Restarted Prograf and CellCept as recommended by rheumatologist Dr. [name].  03/23/19: Patient was seen and examined at her bedside this morning.  She states her abdominal pain has resolved but feels like she is going to have a bowel movement this morning.  She had 3 watery stools with abdominal cramps yesterday.  She denies nausea.  States the swelling in her legs is improved.  Doppler ultrasound bilateral lower extremity negative for DVT on 03/22/2019.  Assessment/Plan: Active Problems:   Raynaud's disease   Seasonal allergies   IDA (iron deficiency anemia)   SLE (systemic lupus erythematosus) (HCC)   Depression   Thrush   Lupus (systemic lupus erythematosus) (HCC)   Bronchitis   Essential hypertension   Elevated AST (SGOT)   Dehydration   Sinus tachycardia   Metabolic acidosis   Oral thrush  Generalized fatigue/arthralgia likely secondary to lupus flare Persistent symptomatology with generalized weakness, arthralgia, lower extremity tenderness on palpation, poor appetite with poor oral intake Negative serum hCG Continue Prograf and CellCept as recommended by her rheumatologist Dr. [name] Renal function is stable Taper down steroids, DC Solu-Medrol  and start prednisone Allergic to hydroxychloroquine  PT to assess Fall precautions  Lupus nephritis Follows with nephrology outpatient Elevated CRP and sed rate Renal function stable Negative HCG Continue Prograf  Loose stools, unclear etiology 3 episodes yesterday Abdominal pain resolved on Pepto-Bismol, continue as needed Afebrile with no leukocytosis Procalcitonin 0.14 and lactic acid 1.5 on 03/21/2019   Resolved acute bronchitis Independently reviewed chest x-ray done on admission which showed no lobular infiltrates COVID-19 testing negative Will complete Augmentin twice daily x5 days Continue symptomatic care Avoid IV dye due to hx of lupus nephritis  Resolving SVT Asymptomatic States she has had a fast heart rate TSH normal Continue Lopressor 25 mg twice daily Continue to closely monitor vital signs  Bilateral lower extremity edema and tenderness No erythema or warmth to suggest cellulitis Negative Doppler ultrasound bilaterally, ruled out DVT  Raynaud's syndrome/hypertension Continue amlodipine increased dose to 10 mg p.o. daily  Severe hypomagnesemia Magnesium 1.2 Repleted with IV magnesium 4 g x 2.  Acute dehydration secondary to poor oral intake Continue gentle IV fluid hydration normal saline at 75 cc/h Encourage increase oral intake  Resolved acute urinary retention on bethanechol Per nursing staff feeling of urgency and frequency but unable to void Bladder scan revealed 350 cc Straight in and out catheter, 600 cc of urine obtained. Continue to closely monitor urine output On bethanechol as needed for acute urinary retention  Moderate protein calorie malnutrition Albumin 2.2 BMI 22 Encourage increasing oral protein calorie intake  Non-anion gap metabolic acidosis Bicarb 18 Anion gap 6  Elevated AST -AST on Admission was 68 -C/w IVF Hydration -Continue to Monitor and Trend Hepatic Fxn Panel -If consistently elevated Will need RUQ U/S and Acute Hepatitis Panel in the AM -Repeat CMP in AM   Oral Thrush -In the setting of Chronic Steroid Use -Continue Mycostatin 500,000 Units po 4 times daily -Given 1x of Fluconazole 150 mg po    DVT prophylaxis: Enoxaparin 40 mg sq24h Code Status: FULL CODE  Family Communication: No family present at bedside  Disposition Plan:  Possible discharge to home tomorrow 03/24/2019 with home health services.   Consults  called: None   Objective: Vitals:   03/22/19 0648 03/22/19 1447 03/22/19 2100 03/23/19 0642  BP: (!) 120/93 (!) 136/111 (!) 121/92 (!) 135/111  Pulse: 96 (!) 106 (!) 107 (!) 122  Resp: 18 20  18   Temp:  98.6 F (37 C) 98.7 F (37.1 C) 98.7 F (37.1 C)  TempSrc:   Oral Oral  SpO2: 99% 100% 100% 91%  Weight:      Height:        Intake/Output Summary (Last 24 hours) at 03/23/2019 1114 Last data filed at 03/23/2019 1029 Gross per 24 hour  Intake 2008.8 ml  Output 100 ml  Net 1908.8 ml   Filed Weights   03/20/19 0729 03/21/19 0430 03/22/19 0626  Weight: 54.8 kg 55 kg 56.3 kg    Exam:  . General: 23 y.o. year-old female thin built in no acute distress.  Alert and oriented x3.   . Cardiovascular: Regular rate and rhythm no rubs or gallops no JVD or thyromegaly . Respiratory: Clear to auscultation no wheezes or rales.  Poor inspiratory effort... . Abdomen: Nontender bowel sounds present.  Nondistended.. . Musculoskeletal: Trace lower extremity edema.  2 out of 4 pulses in all 4 extremities. Marland Kitchen Psychiatry: Mood is appropriate for condition and setting..  Data Reviewed: CBC: Recent Labs  Lab 03/19/19 0822 03/20/19 0500  WBC 4.9 4.2  NEUTROABS 4.1  --   HGB 11.4* 10.6*  HCT 36.0 33.6*  MCV 90.9 90.6  PLT 157 756*   Basic Metabolic Panel: Recent Labs  Lab 03/19/19 0822 03/20/19 0500 03/22/19 0808  NA 135 136 138  K 4.3 4.0 3.7  CL 105 112* 109  CO2 19* 18* 18*  GLUCOSE 94 83 88  BUN 12 9 12   CREATININE 0.71 0.59 0.54  CALCIUM 8.5* 7.7* 8.4*  MG  --  1.2* 2.0  PHOS  --  3.7  --    GFR: Estimated Creatinine Clearance: 85.8 mL/min (by C-G formula based on SCr of 0.54 mg/dL). Liver Function Tests: Recent Labs  Lab 03/19/19 0822 03/20/19 0500  AST 68* 63*  ALT 31 25  ALKPHOS 120 95  BILITOT 0.3 0.5  PROT 7.6 6.8  ALBUMIN 2.7* 2.2*   No results for input(s): LIPASE, AMYLASE in the last 168 hours. No results for input(s): AMMONIA in the last 168 hours.  Coagulation Profile: No results for input(s): INR, PROTIME in the last 168 hours. Cardiac Enzymes: No results for input(s): CKTOTAL, CKMB, CKMBINDEX, TROPONINI in the last 168 hours. BNP (last 3 results) No results for input(s): PROBNP in the last 8760 hours. HbA1C: No results for input(s): HGBA1C in the last 72 hours. CBG: Recent Labs  Lab 03/22/19 0734 03/22/19 0807 03/22/19 1104 03/22/19 2057 03/23/19 0728  GLUCAP 39* 81 81 109* 84   Lipid Profile: No results for input(s): CHOL, HDL, LDLCALC, TRIG, CHOLHDL, LDLDIRECT in the last 72 hours. Thyroid Function Tests: No results for input(s): TSH, T4TOTAL, FREET4, T3FREE, THYROIDAB in the last 72 hours. Anemia Panel: No results for input(s): VITAMINB12, FOLATE, FERRITIN, TIBC, IRON, RETICCTPCT in the last 72 hours. Urine analysis:    Component Value Date/Time   COLORURINE YELLOW 03/19/2019 0850   APPEARANCEUR CLEAR 03/19/2019 0850   LABSPEC 1.014 03/19/2019 0850   PHURINE 6.0 03/19/2019 0850   GLUCOSEU NEGATIVE 03/19/2019 0850   HGBUR SMALL (A) 03/19/2019 0850   BILIRUBINUR NEGATIVE 03/19/2019 0850   BILIRUBINUR small 08/30/2018 0953   KETONESUR NEGATIVE 03/19/2019 0850   PROTEINUR 100 (A) 03/19/2019 0850   UROBILINOGEN 0.2 08/30/2018 0953   NITRITE NEGATIVE 03/19/2019 0850   LEUKOCYTESUR NEGATIVE 03/19/2019 0850   Sepsis Labs: @LABRCNTIP (procalcitonin:4,lacticidven:4)  ) Recent Results (from the past 240 hour(s))  Culture, blood (Routine x 2)     Status: None (Preliminary result)   Collection Time: 03/19/19  8:08 AM   Specimen: BLOOD  Result Value Ref Range Status   Specimen Description   Final    BLOOD RIGHT ANTECUBITAL Performed at [facility], [place] [address] [place], [place] [address]    Special Requests   Final    BOTTLES DRAWN AEROBIC AND ANAEROBIC Blood Culture adequate volume Performed at [place] [address] [place], [place] [address]    Culture   Final    NO GROWTH  3 DAYS Performed at [facility], [place] [address], [place], [place] [address]    Report Status PENDING  Incomplete  SARS Coronavirus 2 (CEPHEID- Performed in [place] hospital lab), Hosp Order     Status: None   Collection Time: 03/19/19  8:50 AM   Specimen: Nasopharyngeal Swab  Result Value Ref Range Status   SARS Coronavirus 2 NEGATIVE NEGATIVE Final    Comment: (NOTE) If result is NEGATIVE SARS-CoV-2 target nucleic acids are NOT DETECTED. The SARS-CoV-2 RNA is generally detectable in upper and lower  respiratory specimens  during the acute phase of infection. The lowest  concentration of SARS-CoV-2 viral copies this assay can detect is 250  copies / mL. A negative result does not preclude SARS-CoV-2 infection  and should not be used as the sole basis for treatment or other  patient management decisions.  A negative result may occur with  improper specimen collection / handling, submission of specimen other  than nasopharyngeal swab, presence of viral mutation(s) within the  areas targeted by this assay, and inadequate number of viral copies  (<250 copies / mL). A negative result must be combined with clinical  observations, patient history, and epidemiological information. If result is POSITIVE SARS-CoV-2 target nucleic acids are DETECTED. The SARS-CoV-2 RNA is generally detectable in upper and lower  respiratory specimens dur ing the acute phase of infection.  Positive  results are indicative of active infection with SARS-CoV-2.  Clinical  correlation with patient history and other diagnostic information is  necessary to determine patient infection status.  Positive results do  not rule out bacterial infection or co-infection with other viruses. If result is PRESUMPTIVE POSTIVE SARS-CoV-2 nucleic acids MAY BE PRESENT.   A presumptive positive result was obtained on the submitted specimen  and confirmed on repeat testing.  While 2019 novel coronavirus  (SARS-CoV-2)  nucleic acids may be present in the submitted sample  additional confirmatory testing may be necessary for epidemiological  and / or clinical management purposes  to differentiate between  SARS-CoV-2 and other Sarbecovirus currently known to infect humans.  If clinically indicated additional testing with an alternate test  methodology [phone]) is advised. The SARS-CoV-2 RNA is generally  detectable in upper and lower respiratory sp ecimens during the acute  phase of infection. The expected result is Negative. Fact Sheet for Patients:  StrictlyIdeas.no Fact Sheet for Healthcare Providers: BankingDealers.co.za This test is not yet approved or cleared by the [place] FDA and has been authorized for detection and/or diagnosis of SARS-CoV-2 by FDA under an Emergency Use Authorization (EUA).  This EUA will remain in effect (meaning this test can be used) for the duration of the COVID-19 declaration under Section 564(b)(1) of the Act, 21 U.S.C. section 360bbb-3(b)(1), unless the authorization is terminated or revoked sooner. Performed at [facility], [place] [address], [place], [place] [address]   Urine culture     Status: None   Collection Time: 03/19/19  3:15 PM   Specimen: Urine, Random  Result Value Ref Range Status   Specimen Description   Final    URINE, RANDOM Performed at [place] [address], [place], [place] [address]    Special Requests   Final    NONE Performed at [facility], [place] [address], [place], [place] [address]    Culture   Final    NO GROWTH Performed at [facility], [place] [address] [place], [place] [address]    Report Status 03/21/2019 FINAL  Final      Studies: No results found.  Scheduled Meds: . acetaminophen  650 mg Oral Once  . amLODipine  10 mg Oral Daily  . amoxicillin-clavulanate  1 tablet Oral Q12H  . bethanechol  5 mg  Oral TID  . bismuth subsalicylate  786 mg Oral TID AC & HS  . cholecalciferol  1,000 Units Oral Daily  . DULoxetine  60 mg Oral Daily  . enoxaparin (LOVENOX) injection  40 mg Subcutaneous Q24H  . ferrous sulfate  325 mg Oral BID  .  folic acid  1 mg Oral QHS  . folic acid  2 mg Oral Daily  . guaiFENesin  1,200 mg Oral BID  . methylPREDNISolone (SOLU-MEDROL) injection  40 mg Intravenous Daily  . metoprolol tartrate  25 mg Oral BID  . mycophenolate  1,000 mg Oral BID  . nystatin  5 mL Oral QID  . pantoprazole  20 mg Oral QHS  . senna-docusate  2 tablet Oral QHS  . tacrolimus  1 mg Oral BID  . Vitamin D (Ergocalciferol)  50,000 Units Oral Once per day on Tue Thu    Continuous Infusions: . dextrose 5 % and 0.45% NaCl 75 mL/hr at 03/23/19 0630     LOS: 4 days     Kayleen Memos, MD [facility] Pager [phone]  If 7PM-7AM, please contact night-coverage [email or web address] Password [email or web address] 03/23/2019, 11:14 AM

## 2019-03-23 NOTE — Progress Notes (Signed)
CRITICAL VALUE ALERT  Critical Value:  Lactic Acid 2.9  Date & Time Notied:  03/23/19, 1521  Provider Notified: Dr. Nevada Crane  Orders Received/Actions taken: Orders received.

## 2019-03-23 NOTE — Progress Notes (Signed)
Bedside RN reports nausea and vomiting x 2 later this morning, incontinent of stool x 2. BP and HR elevated, could not take her po meds this am due to N&V. No fever reported. Ordered lactic acid and procalcitonin to r/o infective process. 12 leqd EKG ordered due to tachycardia. IV Phenergan prn and scheduled IV lopressor added.

## 2019-03-24 DIAGNOSIS — F0631 Mood disorder due to known physiological condition with depressive features: Secondary | ICD-10-CM

## 2019-03-24 LAB — COMPREHENSIVE METABOLIC PANEL
ALT: 29 U/L (ref 0–44)
AST: 62 U/L — ABNORMAL HIGH (ref 15–41)
Albumin: 2.5 g/dL — ABNORMAL LOW (ref 3.5–5.0)
Alkaline Phosphatase: 135 U/L — ABNORMAL HIGH (ref 38–126)
Anion gap: 12 (ref 5–15)
BUN: 17 mg/dL (ref 6–20)
CO2: 13 mmol/L — ABNORMAL LOW (ref 22–32)
Calcium: 8.7 mg/dL — ABNORMAL LOW (ref 8.9–10.3)
Chloride: 111 mmol/L (ref 98–111)
Creatinine, Ser: 1.01 mg/dL — ABNORMAL HIGH (ref 0.44–1.00)
GFR calc Af Amer: >60 mL/min (ref >60)
GFR calc non Af Amer: >60 mL/min (ref >60)
Glucose, Bld: 89 mg/dL (ref 70–99)
Potassium: 3.7 mmol/L (ref 3.5–5.1)
Sodium: 136 mmol/L (ref 135–145)
Total Bilirubin: 0.3 mg/dL (ref 0.3–1.2)
Total Protein: 6.8 g/dL (ref 6.5–8.1)

## 2019-03-24 LAB — GLUCOSE, CAPILLARY
Glucose-Capillary: 75 mg/dL (ref 70–99)
Glucose-Capillary: 44 mg/dL — CL (ref 70–99)
Glucose-Capillary: 38 mg/dL — CL (ref 70–99)
Glucose-Capillary: 41 mg/dL — CL (ref 70–99)
Glucose-Capillary: 31 mg/dL — CL (ref 70–99)
Glucose-Capillary: 60 mg/dL — ABNORMAL LOW (ref 70–99)
Glucose-Capillary: 118 mg/dL — ABNORMAL HIGH (ref 70–99)
Glucose-Capillary: 99 mg/dL (ref 70–99)
Glucose-Capillary: 13 mg/dL — CL (ref 70–99)
Glucose-Capillary: <10 mg/dL — CL (ref 70–99)

## 2019-03-24 LAB — CBC
HCT: 37.7 % (ref 36.0–46.0)
Hemoglobin: 11.7 g/dL — ABNORMAL LOW (ref 12.0–15.0)
MCH: 27.7 pg (ref 26.0–34.0)
MCHC: 31 g/dL (ref 30.0–36.0)
MCV: 89.3 fL (ref 80.0–100.0)
Platelets: 137 10*3/uL — ABNORMAL LOW (ref 150–400)
RBC: 4.22 MIL/uL (ref 3.87–5.11)
RDW: 15.2 % (ref 11.5–15.5)
WBC: 7.1 10*3/uL (ref 4.0–10.5)
nRBC: 0 % (ref 0.0–0.2)

## 2019-03-24 LAB — BASIC METABOLIC PANEL
Anion gap: 9 (ref 5–15)
BUN: 15 mg/dL (ref 6–20)
CO2: 16 mmol/L — ABNORMAL LOW (ref 22–32)
Calcium: 8.7 mg/dL — ABNORMAL LOW (ref 8.9–10.3)
Chloride: 112 mmol/L — ABNORMAL HIGH (ref 98–111)
Creatinine, Ser: 0.93 mg/dL (ref 0.44–1.00)
GFR calc Af Amer: >60 mL/min (ref >60)
GFR calc non Af Amer: >60 mL/min (ref >60)
Glucose, Bld: 208 mg/dL — ABNORMAL HIGH (ref 70–99)
Potassium: 3.3 mmol/L — ABNORMAL LOW (ref 3.5–5.1)
Sodium: 137 mmol/L (ref 135–145)

## 2019-03-24 LAB — CULTURE, BLOOD (ROUTINE X 2)
Culture: NO GROWTH
Special Requests: ADEQUATE

## 2019-03-24 LAB — DIFFERENTIAL
Abs Immature Granulocytes: 0.55 10*3/uL — ABNORMAL HIGH (ref 0.00–0.07)
Basophils Absolute: 0.1 10*3/uL (ref 0.0–0.1)
Basophils Relative: 1 %
Eosinophils Absolute: 0 10*3/uL (ref 0.0–0.5)
Eosinophils Relative: 0 %
Immature Granulocytes: 8 %
Lymphocytes Relative: 14 %
Lymphs Abs: 1 10*3/uL (ref 0.7–4.0)
Monocytes Absolute: 0.9 10*3/uL (ref 0.1–1.0)
Monocytes Relative: 12 %
Neutro Abs: 4.7 10*3/uL (ref 1.7–7.7)
Neutrophils Relative %: 65 %

## 2019-03-24 LAB — LACTIC ACID, PLASMA: Lactic Acid, Venous: 1.7 mmol/L (ref 0.5–1.9)

## 2019-03-24 LAB — MAGNESIUM: Magnesium: 1.8 mg/dL (ref 1.7–2.4)

## 2019-03-24 LAB — GLUCOSE, RANDOM: Glucose, Bld: 124 mg/dL — ABNORMAL HIGH (ref 70–99)

## 2019-03-24 LAB — PHOSPHORUS: Phosphorus: 3.3 mg/dL (ref 2.5–4.6)

## 2019-03-24 MED ORDER — DEXTROSE 50 % IV SOLN
25.0000 g | INTRAVENOUS | Status: AC
  Administered 2019-03-24: 25 g via INTRAVENOUS

## 2019-03-24 MED ORDER — DEXTROSE 50 % IV SOLN
12.5000 g | INTRAVENOUS | Status: AC
  Administered 2019-03-24: 12.5 g via INTRAVENOUS

## 2019-03-24 MED ORDER — DEXTROSE 10 % IV SOLN
INTRAVENOUS | Status: DC
  Administered 2019-03-24: 13:00:00 via INTRAVENOUS
  Filled 2019-03-24: qty 1000

## 2019-03-24 MED ORDER — BUPROPION HCL ER (XL) 150 MG PO TB24: 150.0000 mg | ORAL_TABLET | Freq: Every day | ORAL | Status: DC

## 2019-03-24 MED ORDER — DEXTROSE 50 % IV SOLN
INTRAVENOUS | Status: AC
  Administered 2019-03-24: 13:00:00
  Filled 2019-03-24: qty 50

## 2019-03-24 MED ORDER — GLUCOSE 40 % PO GEL
2.0000 | ORAL | Status: DC
  Filled 2019-03-24: qty 2

## 2019-03-24 MED ORDER — METOPROLOL TARTRATE 5 MG/5ML IV SOLN
5.0000 mg | Freq: Four times a day (QID) | INTRAVENOUS | Status: DC
  Administered 2019-03-24: 5 mg via INTRAVENOUS
  Filled 2019-03-24: qty 5

## 2019-03-24 MED ORDER — ADULT MULTIVITAMIN W/MINERALS CH
1.0000 | ORAL_TABLET | Freq: Every day | ORAL | Status: DC
  Administered 2019-03-24 – 2019-03-25 (×2): 1 via ORAL
  Filled 2019-03-24 (×2): qty 1

## 2019-03-24 MED ORDER — BOOST / RESOURCE BREEZE PO LIQD CUSTOM: 1.0000 | Freq: Two times a day (BID) | ORAL | Status: DC

## 2019-03-24 MED ORDER — METOPROLOL TARTRATE 25 MG PO TABS: 25.0000 mg | ORAL_TABLET | Freq: Two times a day (BID) | ORAL | Status: DC

## 2019-03-24 MED ORDER — POTASSIUM CHLORIDE CRYS ER 20 MEQ PO TBCR
40.0000 meq | EXTENDED_RELEASE_TABLET | Freq: Once | ORAL | Status: AC
  Administered 2019-03-24: 40 meq via ORAL
  Filled 2019-03-24: qty 2

## 2019-03-24 MED ORDER — METOPROLOL TARTRATE 25 MG PO TABS
12.5000 mg | ORAL_TABLET | Freq: Two times a day (BID) | ORAL | Status: DC
  Administered 2019-03-24 – 2019-03-25 (×2): 12.5 mg via ORAL
  Filled 2019-03-24 (×2): qty 1

## 2019-03-24 MED ORDER — SODIUM CHLORIDE 0.9 % IV BOLUS
500.0000 mL | Freq: Once | INTRAVENOUS | Status: AC
  Administered 2019-03-24: 500 mL via INTRAVENOUS

## 2019-03-24 MED ORDER — DEXTROSE 50 % IV SOLN
INTRAVENOUS | Status: AC
  Filled 2019-03-24: qty 50

## 2019-03-24 MED ORDER — MAGNESIUM SULFATE 2 GM/50ML IV SOLN
2.0000 g | Freq: Once | INTRAVENOUS | Status: AC
  Administered 2019-03-24: 16:00:00 2 g via INTRAVENOUS
  Filled 2019-03-24: qty 50

## 2019-03-24 MED ORDER — DEXTROSE 10 % IV SOLN
INTRAVENOUS | Status: AC
  Administered 2019-03-24: 16:00:00 via INTRAVENOUS

## 2019-03-24 NOTE — Progress Notes (Signed)
PROGRESS NOTE  [name] [name] AXK:[id number] DOB: 04-27-1996 DOA: 03/19/2019 PCP: [name], FNP  HPI/Recap of past 24 hours: [name] [name] is a 23 y.o. female with medical history significant for but not limited to Gout, HTN, Lupus, Raynaud's disease and other comorbidites who presents with a  CC of 3 day cough and worsening LE Swelling. Also states that she felt weak and dehydrated and states she has had productive cough. Admits to feeling tighter in her extremities typical for a lupus flare. Also states that she feels as if it is more difficult to swallow because she feels like she has thrush. Denies any CP or SOB. No nausea or vomiting. Also says that she has not been eating well at all due to Nausea and inability to keep foods down. States she has been coughing and has been trying to hydrate but does not feel like she has done an adequate job. No other concerns or complaints at this Time but states she has been weaker. [facility] was asked to admit this patient for Cough and Leg swelling as well as decreased po intake.   ED Course: In the ED she was given IVF Boluses (2 Liters) basic blood work done and a CXR. Also had Blood Cultures, Urinalysis, TSH, EKG done. SARS-CoV-2 Negative.   Poor appetite and feels weak.  States her symptoms are similar to prior lupus flare.    Discussed with Dr. [name] via phone on 03/21/19, the patient's rheumatologist at [facility] Prograf was started by her nephrologist due to lupus nephritis. She was on metotrexate previously, stopped due to nephritis. He last saw her on 10/2018 and he had her on cellcept 1000 mg BID and prednisone for her lupus. Not on plaquenil due to allergy.  He recommended to continue IV steroids and once improved to taper down steroids and have close follow up with him outpatient in 1-2 days.   Will obtain prograf level. Ok to restart it along with Cellcept once an active infective process has been ruled out.  Patient will need to call Dr.  Valentina Lucks office and make a follow up appointment when close to discharge. Restarted Prograf and CellCept 03/22/19 as recommended by her rheumatologist Dr. [name]. Doppler ultrasound bilateral lower extremity negative for DVT on 03/22/2019.  Watery stools, nausea and diarrhea 03/23/2019.  03/24/19: Patient was seen and examined at her bedside this morning.  She states her nausea has resolved.  Admits to watery stools x2 this morning.  States that she had refused to take Imodium thinking that it was a laxative.  She is now agreeable to take it.  Assessment/Plan: Active Problems:   Raynaud's disease   Seasonal allergies   IDA (iron deficiency anemia)   SLE (systemic lupus erythematosus) (HCC)   Depression   Thrush   Lupus (systemic lupus erythematosus) (HCC)   Bronchitis   Essential hypertension   Elevated AST (SGOT)   Dehydration   Sinus tachycardia   Metabolic acidosis   Oral thrush  Generalized fatigue/arthralgia likely secondary to lupus flare Persistent symptomatology with generalized weakness, arthralgia, poor appetite with poor oral intake Negative serum hCG On IV Solu-Medrol, Prograf, and CellCept Allergic to hydroxychloroquine PT to assess Fall precautions  Watery stools, unclear etiology Previously refused Imodium thinking that it was a laxative Patient is now agreeable to take Imodium as needed Afebrile no leukocytosis, low likelihood of infective process Reported watery stools this morning x2 with no Imodium Continue IV fluid hydration  Elevated lactic acid Peaked at 2.9  on 03/23/2019 Trending down with IV fluid hydration Repeat lactic acid on 03/24/2019 was 1.7 Not on antibiotics  Lupus nephritis Follows with nephrology outpatient Elevated CRP and sed rate Renal function stable Negative HCG Continue Prograf Avoid IV dye due to history of lupus nephritis  Resolved acute bronchitis Independently reviewed chest x-ray done on admission which showed no lobular  infiltrates COVID-19 testing negative Completed 5 days of Augmentin   Resolving SVT Asymptomatic States she has had a fast heart rate TSH normal If continues to have poor oral intake, continue IV Lopressor 5 mg every 6 hours Continue to monitor vital signs  Bilateral lower extremity edema and tenderness No erythema or warmth to suggest cellulitis Lower extremity edema bilaterally is resolving Negative Doppler ultrasound bilaterally, ruled out DVT  Raynaud's syndrome/hypertension Continue amlodipine increased dose to 10 mg p.o. daily  Severe hypomagnesemia Magnesium 1.2 Repleted with IV magnesium 4 g x 2. Repeated magnesium 2.0 on 03/22/2019  Acute dehydration secondary to poor oral intake Continue IV fluid hydration D5 half-normal saline at 100 cc/h Continue to encourage increased oral intake  Resolved acute urinary retention on bethanechol Per nursing staff feeling of urgency and frequency but unable to void Bladder scan revealed 350 cc Straight in and out catheter, 600 cc of urine obtained. Continue to closely monitor urine output On bethanechol as needed for acute urinary retention  Moderate protein calorie malnutrition Albumin 2.2 BMI 22 Encourage increasing oral protein calorie intake  Non-anion gap metabolic acidosis Bicarb 18 Anion gap 6  Elevated AST -AST on Admission was 68 -C/w IVF Hydration -Continue to Monitor and Trend Hepatic Fxn Panel -If consistently elevated Will need RUQ U/S and Acute Hepatitis Panel in the AM -Repeat CMP in AM   Oral Thrush -In the setting of Chronic Steroid Use -Continue Mycostatin 500,000 Units po 4 times daily -Given 1x of Fluconazole 150 mg po    DVT prophylaxis: Enoxaparin 40 mg sq24h Code Status: FULL CODE  Family Communication: No family present at bedside  Disposition Plan:  Possible discharge to home in 1 to 2 days pending clinical improvement.  Consults called: None   Objective: Vitals:   03/23/19 1456  03/23/19 1608 03/23/19 2030 03/24/19 0608  BP:   (!) 135/106 (!) 131/100  Pulse:   (!) 117 (!) 107  Resp:   14 16  Temp:  99.8 F (37.7 C) 99.6 F (37.6 C) 99.1 F (37.3 C)  TempSrc:  Rectal Oral Oral  SpO2: 99%   98%  Weight:    56.1 kg  Height:        Intake/Output Summary (Last 24 hours) at 03/24/2019 0728 Last data filed at 03/24/2019 0359 Gross per 24 hour  Intake 2324.26 ml  Output 500 ml  Net 1824.26 ml   Filed Weights   03/21/19 0430 03/22/19 0626 03/24/19 4970  Weight: 55 kg 56.3 kg 56.1 kg    Exam:  . General: 23 y.o. year-old female thin built in no acute distress.  Alert and oriented x3.   . Cardiovascular: Regular rate and rhythm no rubs or gallops no JVD or thyromegaly. Marland Kitchen Respiratory: Clear to auscultation no wheezes or rales.  Poor inspiratory effort. . Abdomen: Nontender nondistended normal bowel sounds present. . Musculoskeletal: No lower extremity edema.  2 out of 4 pulses in all 4 extremities. Marland Kitchen Psychiatry: Mood is appropriate for condition and setting.  Data Reviewed: CBC: Recent Labs  Lab 03/19/19 0822 03/20/19 0500 03/23/19 1919  WBC 4.9 4.2 6.9  NEUTROABS 4.1  --  4.8  HGB 11.4* 10.6* 12.6  HCT 36.0 33.6* 40.5  MCV 90.9 90.6 90.0  PLT 157 103* 706*   Basic Metabolic Panel: Recent Labs  Lab 03/19/19 0822 03/20/19 0500 03/22/19 0808  NA 135 136 138  K 4.3 4.0 3.7  CL 105 112* 109  CO2 19* 18* 18*  GLUCOSE 94 83 88  BUN 12 9 12   CREATININE 0.71 0.59 0.54  CALCIUM 8.5* 7.7* 8.4*  MG  --  1.2* 2.0  PHOS  --  3.7  --    GFR: Estimated Creatinine Clearance: 85.8 mL/min (by C-G formula based on SCr of 0.54 mg/dL). Liver Function Tests: Recent Labs  Lab 03/19/19 0822 03/20/19 0500  AST 68* 63*  ALT 31 25  ALKPHOS 120 95  BILITOT 0.3 0.5  PROT 7.6 6.8  ALBUMIN 2.7* 2.2*   No results for input(s): LIPASE, AMYLASE in the last 168 hours. No results for input(s): AMMONIA in the last 168 hours. Coagulation Profile: No results  for input(s): INR, PROTIME in the last 168 hours. Cardiac Enzymes: No results for input(s): CKTOTAL, CKMB, CKMBINDEX, TROPONINI in the last 168 hours. BNP (last 3 results) No results for input(s): PROBNP in the last 8760 hours. HbA1C: No results for input(s): HGBA1C in the last 72 hours. CBG: Recent Labs  Lab 03/22/19 1104 03/22/19 2057 03/23/19 0728 03/23/19 1155 03/23/19 2352  GLUCAP 81 109* 84 72 102*   Lipid Profile: No results for input(s): CHOL, HDL, LDLCALC, TRIG, CHOLHDL, LDLDIRECT in the last 72 hours. Thyroid Function Tests: No results for input(s): TSH, T4TOTAL, FREET4, T3FREE, THYROIDAB in the last 72 hours. Anemia Panel: No results for input(s): VITAMINB12, FOLATE, FERRITIN, TIBC, IRON, RETICCTPCT in the last 72 hours. Urine analysis:    Component Value Date/Time   COLORURINE YELLOW 03/19/2019 0850   APPEARANCEUR CLEAR 03/19/2019 0850   LABSPEC 1.014 03/19/2019 0850   PHURINE 6.0 03/19/2019 0850   GLUCOSEU NEGATIVE 03/19/2019 0850   HGBUR SMALL (A) 03/19/2019 0850   BILIRUBINUR NEGATIVE 03/19/2019 0850   BILIRUBINUR small 08/30/2018 0953   KETONESUR NEGATIVE 03/19/2019 0850   PROTEINUR 100 (A) 03/19/2019 0850   UROBILINOGEN 0.2 08/30/2018 0953   NITRITE NEGATIVE 03/19/2019 0850   LEUKOCYTESUR NEGATIVE 03/19/2019 0850   Sepsis Labs: @LABRCNTIP (procalcitonin:4,lacticidven:4)  ) Recent Results (from the past 240 hour(s))  Culture, blood (Routine x 2)     Status: None (Preliminary result)   Collection Time: 03/19/19  8:08 AM   Specimen: BLOOD  Result Value Ref Range Status   Specimen Description   Final    BLOOD RIGHT ANTECUBITAL Performed at [facility], [place] [address], [place], [place] [address]    Special Requests   Final    BOTTLES DRAWN AEROBIC AND ANAEROBIC Blood Culture adequate volume Performed at [place] [address] [place], [place] [address]    Culture   Final    NO GROWTH 4 DAYS Performed at [facility], [place] [address] [place], [place] [address]    Report Status PENDING  Incomplete  SARS Coronavirus 2 (CEPHEID- Performed in [facility] hospital lab), Hosp Order     Status: None   Collection Time: 03/19/19  8:50 AM   Specimen: Nasopharyngeal Swab  Result Value Ref Range Status   SARS Coronavirus 2 NEGATIVE NEGATIVE Final    Comment: (NOTE) If result is NEGATIVE SARS-CoV-2 target nucleic acids are NOT DETECTED. The SARS-CoV-2 RNA is generally detectable in upper and lower  respiratory specimens  during the acute phase of infection. The lowest  concentration of SARS-CoV-2 viral copies this assay can detect is 250  copies / mL. A negative result does not preclude SARS-CoV-2 infection  and should not be used as the sole basis for treatment or other  patient management decisions.  A negative result may occur with  improper specimen collection / handling, submission of specimen other  than nasopharyngeal swab, presence of viral mutation(s) within the  areas targeted by this assay, and inadequate number of viral copies  (<250 copies / mL). A negative result must be combined with clinical  observations, patient history, and epidemiological information. If result is POSITIVE SARS-CoV-2 target nucleic acids are DETECTED. The SARS-CoV-2 RNA is generally detectable in upper and lower  respiratory specimens dur ing the acute phase of infection.  Positive  results are indicative of active infection with SARS-CoV-2.  Clinical  correlation with patient history and other diagnostic information is  necessary to determine patient infection status.  Positive results do  not rule out bacterial infection or co-infection with other viruses. If result is PRESUMPTIVE POSTIVE SARS-CoV-2 nucleic acids MAY BE PRESENT.   A presumptive positive result was obtained on the submitted specimen  and confirmed on repeat testing.  While 2019 novel coronavirus  (SARS-CoV-2) nucleic acids may be present in the  submitted sample  additional confirmatory testing may be necessary for epidemiological  and / or clinical management purposes  to differentiate between  SARS-CoV-2 and other Sarbecovirus currently known to infect humans.  If clinically indicated additional testing with an alternate test  methodology [phone]) is advised. The SARS-CoV-2 RNA is generally  detectable in upper and lower respiratory sp ecimens during the acute  phase of infection. The expected result is Negative. Fact Sheet for Patients:  StrictlyIdeas.no Fact Sheet for Healthcare Providers: BankingDealers.co.za This test is not yet approved or cleared by the [place] FDA and has been authorized for detection and/or diagnosis of SARS-CoV-2 by FDA under an Emergency Use Authorization (EUA).  This EUA will remain in effect (meaning this test can be used) for the duration of the COVID-19 declaration under Section 564(b)(1) of the Act, 21 U.S.C. section 360bbb-3(b)(1), unless the authorization is terminated or revoked sooner. Performed at [facility], [place] [address] [place], [place] [address]   Urine culture     Status: None   Collection Time: 03/19/19  3:15 PM   Specimen: Urine, Random  Result Value Ref Range Status   Specimen Description   Final    URINE, RANDOM Performed at [place] [address], [place], [place] [address]    Special Requests   Final    NONE Performed at [facility], [place] [address], [place], [place] [address]    Culture   Final    NO GROWTH Performed at [facility], [place] [address], [place], [place] [address]    Report Status 03/21/2019 FINAL  Final      Studies: No results found.  Scheduled Meds: . acetaminophen  650 mg Oral Once  . amLODipine  10 mg Oral Daily  . cholecalciferol  1,000 Units Oral Daily  . DULoxetine  60 mg Oral Daily  . enoxaparin (LOVENOX)  injection  40 mg Subcutaneous Q24H  . feeding supplement (ENSURE ENLIVE)  237 mL Oral BID BM  . ferrous sulfate  325 mg Oral BID  . folic acid  1 mg Oral QHS  . folic acid  2 mg Oral  Daily  . guaiFENesin  1,200 mg Oral BID  . methylPREDNISolone (SOLU-MEDROL) injection  40 mg Intravenous Q24H  . metoprolol tartrate  5 mg Intravenous Q6H  . mycophenolate  1,000 mg Oral BID  . nystatin  5 mL Oral QID  . pantoprazole  20 mg Oral QHS  . senna-docusate  2 tablet Oral QHS  . tacrolimus  1 mg Oral BID  . Vitamin D (Ergocalciferol)  50,000 Units Oral Once per day on Tue Thu    Continuous Infusions: . dextrose 5 % and 0.45% NaCl 100 mL/hr at 03/24/19 0339     LOS: 5 days     Kayleen Memos, MD [facility] Pager [phone]  If 7PM-7AM, please contact night-coverage [email or web address] Password [facility] 03/24/2019, 7:28 AM

## 2019-03-24 NOTE — Progress Notes (Addendum)
Hypoglycemic Event  CBG: 44  Treatment: 4 oz juice/soda  Symptoms: Shaky  Follow-up CBG: Time:1157 CBG Result:41  Possible Reasons for Event: Inadequate meal intake  Comments/MD notified: Dr. [place] Crane notified. Orders for D10 @ 100/hr received.   Follow up CBG @ 1157 CBG: 41  Treatment: D50 19mL  Symptoms: Shaky  Follow-up CBG: Time:1224 CBG Result:38  Possible Reasons for Event: Inadequate meal intake  Comments/MD notified: Dr. [place] Crane notified. BMP ordered.   Follow up CBG@1224  CBG: 38  Treatment: D50 22mL  Symptoms: Shaky  Follow-up CBG: Time:1249 CBG Result:31  Possible Reasons for Event: Inadequate meal intake  Comments/MD notified: Dr. [place] Crane notified.   Follow up CBG@1239  CBG: 31  Treatment: D50 28mL  Symptoms: Asymptomatic  Follow-up CBG: Time:1329 CBG Result: 60  Possible Reasons for Event: Inadequate meal intake  Comments/MD notified: Dr. [place] Crane notified.   Follow up CBG@1329  CBG: 60  Treatment: D50 70mL  Symptoms: Asymptomatic  Follow-up CBG: Time:1400 CBG Result: 118  Possible Reasons for Event: Inadequate meal intake  Comments/MD notified: Dr. [place] Crane notified. BMP resulted glucose at 208. Orders to reduce D10 to 34mL/hr.     Rosana Fret

## 2019-03-24 NOTE — Progress Notes (Signed)
Initial Nutrition Assessment  RD working remotely.   DOCUMENTATION CODES:   (unable to assess for malnutrition at this time.)  INTERVENTION:  - will d/c Ensure Enlive per patient request. - will order Boost Breeze BID, each supplement provides 250 kcal and 9 grams of protein. - will order daily multivitamin with minerals. - continue to encourage PO intakes and advance diet as medically feasible. - will continue to monitor for additional nutrition-related needs.    NUTRITION DIAGNOSIS:   Inadequate oral intake related to acute illness, nausea, vomiting as evidenced by per patient/family report.  GOAL:   Patient will meet greater than or equal to 90% of their needs  MONITOR:   PO intake, Supplement acceptance, Labs, Weight trends  REASON FOR ASSESSMENT:   Consult Assessment of nutrition requirement/status  ASSESSMENT:   23 y.o. female with medical history significant for gout, HTN, lupus, Raynaud's disease, and other comorbidities. She presented to the ED and reported 3 day history of productive cough and worsening BLE swelling. She reported feeling weak and dehydrated. She reported swelling is typical for her during a lupus flare. She has had difficulty swallowing d/t feeling like she has thrush. She has not experienced chest pain or shortness of breath. She has been nauseated and reported not eating well d/t this and inability to keep foods down.  Diet downgraded from Heart Healthy to FLD yesterday at 12:20 PM. Per RN flow sheet, patient consumed 10% of breakfast and dinner on 7/8; 10% of breakfast and 100% of dinner on 7/9; 0% of breakfast on 7/10. No other intakes documented since admission on 7/7. Ensure Enlive was ordered BID yesterday and patient refused both bottles offered to her; prefers not to receive this supplement.  Able to briefly talk with patient on the phone this afternoon. She reports that oral/throat pain is still present but that it is slowly improving and  she has not been nauseated today and has not vomited in several days. She was very hopeful to eat well for lunch this afternoon and reported that she is making an effort to eat better moving forward now that symptoms are improving. She states she has had thrush or similar type pain in the past but that it is not a frequent problem for her. When asked if there are any foods she avoids because of lupus/to limit flares/not exacerbate flair symptoms patient reports she avoids or limits greasy and fried foods.   Per chart review, current weight is 124 lb and weight on 09/28/18 was 138 lb. This indicates 14 lb weight loss (10% body weight) in the past 6 months. Suspect that some degree of malnutrition may be present, but unable to confirm without completing NFPE.   Per notes: - generalized fatigue thought to be 2/2 lupus flare - watery stools with unclear etiology--agreeable to imodium - elevated lactic acid with peak on 7/11 and now trending back down - lupus nephritis - acute bronchitis--resolved - BLE edema and tenderness without evidence of cellulitis - severe hypomagnesemia--now resolved - acute dehydration 2/2 poor oral intake - acute urinary retention--resolved - moderate PCM - oral thrush d/t chronic steroid use   Psychiatry was consulted and met with patient remotely today d/t uncontrolled depression. She was not identified as needing inpatient psych admission.    Medications reviewed; 1000 units vitamin D3/day, 325 mg ferrous sulfate BID, 1 mg folvite/day, 2 mg folvite/day, PRN imodium, 40 mg solu-medrol/day, 5 ml mycostatin QID, 20 mg oral protonix/day, 2 tablets senokot/day, 50000 units drisdol on Tuesdays  and Thursdays. Labs reviewed; CBGs: 31-75 mg/dl today, Ca: 8.4 mg/dl. IVF; D10 @ 100 ml/hr (816 kcal).      NUTRITION - FOCUSED PHYSICAL EXAM:  unable to complete at this time.   Diet Order:   Diet Order            Diet full liquid Room service appropriate? Yes; Fluid  consistency: Thin  Diet effective now              EDUCATION NEEDS:   No education needs have been identified at this time  Skin:  Skin Assessment: Reviewed RN Assessment  Last BM:  7/11  Height:   Ht Readings from Last 1 Encounters:  03/19/19 _0  (1.575 m)    Weight:   Wt Readings from Last 1 Encounters:  03/24/19 56.1 kg    Ideal Body Weight:  50 kg  BMI:  Body mass index is 22.62 kg/m.  Estimated Nutritional Needs:   Kcal:  1960-2200 kcal  Protein:  95-110 grams  Fluid:  >/= 2 L/day     Jarome Matin, MS, RD, LDN, [facility] Inpatient Clinical Dietitian Pager # [phone] After hours/weekend pager # [phone]

## 2019-03-24 NOTE — Consult Note (Signed)
Telepsych Consultation   Reason for Consult: ''uncontrol depression'' Referring Physician:  Dr. [place] Crane Location of Patient: [facility] Location of Provider: [facility]  Patient Identification: [name] [name] MRN:  [id number] Principal Diagnosis: Depressive disorder due to another medical condition with depressive features Diagnosis:  Principal Problem:   Depressive disorder due to another medical condition with depressive features Active Problems:   Raynaud's disease   Seasonal allergies   IDA (iron deficiency anemia)   SLE (systemic lupus erythematosus) (HCC)   Depression   Thrush   Lupus (systemic lupus erythematosus) ([place])   Bronchitis   Essential hypertension   Elevated AST (SGOT)   Dehydration   Sinus tachycardia   Metabolic acidosis   Oral thrush   Total Time spent with patient: 45 minutes  Subjective:   [name] [name] is a 23 y.o. female patient admitted with cough, worsening LE swelling and deydration.  HPI:  Patient with history of Gout, HTN, Lupus, Raynaud's disease and depression who was admitted due to dehydration, generalized body weakness, cough and worsening LE Swelling. Patient reports being depressed due to having multiple medical conditions that are not responding well to treatment. She attributes her depressive symptoms to her medical conditions, denies psychosis, delusions, suicidal ideation, intent or plan. She reports taking Cymbalta for depression but agreed to a trial of other antidepressant.   Past Psychiatric History: depression  Risk to Self:  denies Risk to Others:   denies Prior Inpatient Therapy:   none reported Prior Outpatient Therapy:   Patient Care center  Past Medical History:  Past Medical History:  Diagnosis Date  . Gout   . Hypertension   . Lupus ([place])   . Raynaud disease     Past Surgical History:  Procedure Laterality Date  . ORIF SHOULDER FRACTURE Left   . TONSILLECTOMY     Family History:  Family History  Problem  Relation Age of Onset  . Aneurysm Mother   . Stroke Sister   . Seizures Sister   . Lung cancer Maternal Aunt   . Heart attack Paternal Uncle   . Colon cancer Neg Hx   . Esophageal cancer Neg Hx   . Pancreatic cancer Neg Hx   . Stomach cancer Neg Hx    Family Psychiatric  History:  Social History:  Social History   Substance and Sexual Activity  Alcohol Use No     Social History   Substance and Sexual Activity  Drug Use No    Social History   Socioeconomic History  . Marital status: Single    Spouse name: Not on file  . Number of children: Not on file  . Years of education: Not on file  . Highest education level: Not on file  Occupational History  . Not on file  Social Needs  . Financial resource strain: Not on file  . Food insecurity    Worry: Not on file    Inability: Not on file  . Transportation needs    Medical: Not on file    Non-medical: Not on file  Tobacco Use  . Smoking status: Never Smoker  . Smokeless tobacco: Never Used  Substance and Sexual Activity  . Alcohol use: No  . Drug use: No  . Sexual activity: Not Currently  Lifestyle  . Physical activity    Days per week: Not on file    Minutes per session: Not on file  . Stress: Not on file  Relationships  . Social connections    Talks  on phone: Not on file    Gets together: Not on file    Attends religious service: Not on file    Active member of club or organization: Not on file    Attends meetings of clubs or organizations: Not on file    Relationship status: Not on file  Other Topics Concern  . Not on file  Social History Narrative  . Not on file   Additional Social History:    Allergies:   Allergies  Allergen Reactions  . Hydroxychloroquine Rash  . Latex Rash    Labs:  Results for orders placed or performed during the hospital encounter of 03/19/19 (from the past 48 hour(s))  Glucose, capillary     Status: Abnormal   Collection Time: 03/22/19  8:57 PM  Result Value Ref Range    Glucose-Capillary 109 (H) 70 - 99 mg/dL  Glucose, capillary     Status: None   Collection Time: 03/23/19  7:28 AM  Result Value Ref Range   Glucose-Capillary 84 70 - 99 mg/dL  Glucose, capillary     Status: None   Collection Time: 03/23/19 11:55 AM  Result Value Ref Range   Glucose-Capillary 72 70 - 99 mg/dL  Lactic acid, plasma     Status: Abnormal   Collection Time: 03/23/19  2:25 PM  Result Value Ref Range   Lactic Acid, Venous 2.9 (HH) 0.5 - 1.9 mmol/L    Comment: CRITICAL RESULT CALLED TO, READ BACK BY AND VERIFIED WITH: Jaynee Eagles [id number] @ [place] Performed at [facility] [address], [place], [place] [address]   Procalcitonin - Baseline     Status: None   Collection Time: 03/23/19  2:25 PM  Result Value Ref Range   Procalcitonin 0.10 ng/mL    Comment:        Interpretation: PCT (Procalcitonin) <= 0.5 ng/mL: Systemic infection (sepsis) is not likely. Local bacterial infection is possible. (NOTE)       Sepsis PCT Algorithm           Lower Respiratory Tract                                      Infection PCT Algorithm    ----------------------------     ----------------------------         PCT < 0.25 ng/mL                PCT < 0.10 ng/mL         Strongly encourage             Strongly discourage   discontinuation of antibiotics    initiation of antibiotics    ----------------------------     -----------------------------       PCT 0.25 - 0.50 ng/mL            PCT 0.10 - 0.25 ng/mL               OR       >80% decrease in PCT            Discourage initiation of                                            antibiotics      Encourage discontinuation  of antibiotics    ----------------------------     -----------------------------         PCT >= 0.50 ng/mL              PCT 0.26 - 0.50 ng/mL               AND        <80% decrease in PCT             Encourage initiation of                                              antibiotics       Encourage continuation           of antibiotics    ----------------------------     -----------------------------        PCT >= 0.50 ng/mL                  PCT > 0.50 ng/mL               AND         increase in PCT                  Strongly encourage                                      initiation of antibiotics    Strongly encourage escalation           of antibiotics                                     -----------------------------                                           PCT <= 0.25 ng/mL                                                 OR                                        > 80% decrease in PCT                                     Discontinue / Do not initiate                                             antibiotics Performed at [facility] [address] [place], [place] [address]   Lactic acid, plasma     Status: None   Collection Time: 03/23/19  7:19 PM  Result Value Ref Range   Lactic Acid, Venous 1.8 0.5 - 1.9  mmol/L    Comment: Performed at [facility], [place] [address] [place], [place] [address]  CBC with Differential/Platelet     Status: Abnormal   Collection Time: 03/23/19  7:19 PM  Result Value Ref Range   WBC 6.9 4.0 - 10.5 K/uL   RBC 4.50 3.87 - 5.11 MIL/uL   Hemoglobin 12.6 12.0 - 15.0 g/dL   HCT 40.5 36.0 - 46.0 %   MCV 90.0 80.0 - 100.0 fL   MCH 28.0 26.0 - 34.0 pg   MCHC 31.1 30.0 - 36.0 g/dL   RDW 15.3 11.5 - 15.5 %   Platelets 131 (L) 150 - 400 K/uL   nRBC 0.3 (H) 0.0 - 0.2 %   Neutrophils Relative % 68 %   Neutro Abs 4.8 1.7 - 7.7 K/uL   Lymphocytes Relative 13 %   Lymphs Abs 0.9 0.7 - 4.0 K/uL   Monocytes Relative 11 %   Monocytes Absolute 0.7 0.1 - 1.0 K/uL   Eosinophils Relative 0 %   Eosinophils Absolute 0.0 0.0 - 0.5 K/uL   Basophils Relative 1 %   Basophils Absolute 0.1 0.0 - 0.1 K/uL   Immature Granulocytes 7 %   Abs Immature Granulocytes 0.46 (H) 0.00 - 0.07 K/uL    Comment:  Performed at [facility], [place] [address], [place], [place] [address]  Lactic acid, plasma     Status: Abnormal   Collection Time: 03/23/19 10:19 PM  Result Value Ref Range   Lactic Acid, Venous 2.3 (HH) 0.5 - 1.9 mmol/L    Comment: CRITICAL RESULT CALLED TO, READ BACK BY AND VERIFIED WITH: FRAZIER,A AT 2321 ON 03/23/2019 BY JPM Performed at [facility] [address] [place], [place] [address]   Glucose, capillary     Status: Abnormal   Collection Time: 03/23/19 11:52 PM  Result Value Ref Range   Glucose-Capillary 102 (H) 70 - 99 mg/dL  Magnesium     Status: None   Collection Time: 03/24/19  5:10 AM  Result Value Ref Range   Magnesium 1.8 1.7 - 2.4 mg/dL    Comment: Performed at [facility], [place] [address] [place], [place] [address]  Lactic acid, plasma     Status: None   Collection Time: 03/24/19  5:10 AM  Result Value Ref Range   Lactic Acid, Venous 1.7 0.5 - 1.9 mmol/L    Comment: Performed at [facility], [place] [address] [place], [place] [address]  Glucose, capillary     Status: None   Collection Time: 03/24/19  7:29 AM  Result Value Ref Range   Glucose-Capillary 75 70 - 99 mg/dL  CBC     Status: Abnormal   Collection Time: 03/24/19 10:53 AM  Result Value Ref Range   WBC 7.1 4.0 - 10.5 K/uL   RBC 4.22 3.87 - 5.11 MIL/uL   Hemoglobin 11.7 (L) 12.0 - 15.0 g/dL   HCT 37.7 36.0 - 46.0 %   MCV 89.3 80.0 - 100.0 fL   MCH 27.7 26.0 - 34.0 pg   MCHC 31.0 30.0 - 36.0 g/dL   RDW 15.2 11.5 - 15.5 %   Platelets 137 (L) 150 - 400 K/uL   nRBC 0.0 0.0 - 0.2 %    Comment: Performed at [facility], [place] [address], [place], [place] [address]  Differential     Status: Abnormal   Collection Time: 03/24/19 10:53 AM  Result Value Ref Range   Neutrophils  Relative % 65 %   Neutro Abs 4.7 1.7 - 7.7 K/uL   Lymphocytes Relative 14 %   Lymphs Abs 1.0 0.7 - 4.0 K/uL   Monocytes Relative 12 %    Monocytes Absolute 0.9 0.1 - 1.0 K/uL   Eosinophils Relative 0 %   Eosinophils Absolute 0.0 0.0 - 0.5 K/uL   Basophils Relative 1 %   Basophils Absolute 0.1 0.0 - 0.1 K/uL   RBC Morphology ELLIPTOCYTES PRESENT    Immature Granulocytes 8 %   Abs Immature Granulocytes 0.55 (H) 0.00 - 0.07 K/uL    Comment: Performed at [facility], [place] [address] [place], [place] [address]  Glucose, capillary     Status: Abnormal   Collection Time: 03/24/19 11:28 AM  Result Value Ref Range   Glucose-Capillary 44 (LL) 70 - 99 mg/dL   Comment 1 Notify RN   Glucose, capillary     Status: Abnormal   Collection Time: 03/24/19 11:57 AM  Result Value Ref Range   Glucose-Capillary 41 (LL) 70 - 99 mg/dL   Comment 1 Notify RN   Glucose, capillary     Status: Abnormal   Collection Time: 03/24/19 12:24 PM  Result Value Ref Range   Glucose-Capillary 38 (LL) 70 - 99 mg/dL   Comment 1 Document in Chart   Glucose, capillary     Status: Abnormal   Collection Time: 03/24/19 12:49 PM  Result Value Ref Range   Glucose-Capillary 31 (LL) 70 - 99 mg/dL   Comment 1 Document in Chart   Glucose, capillary     Status: Abnormal   Collection Time: 03/24/19  1:29 PM  Result Value Ref Range   Glucose-Capillary 60 (L) 70 - 99 mg/dL    Medications:  Current Facility-Administered Medications  Medication Dose Route Frequency Provider Last Rate Last Dose  . acetaminophen (TYLENOL) tablet 650 mg  650 mg Oral Q6H PRN Raiford Noble [place], DO   650 mg at 03/23/19 2053   Or  . acetaminophen (TYLENOL) suppository 650 mg  650 mg Rectal Q6H PRN Raiford Noble Latif, DO      . acetaminophen (TYLENOL) tablet 650 mg  650 mg Oral Once Sheikh, Omair Latif, DO      . albuterol (PROVENTIL) (2.5 MG/3ML) 0.083% nebulizer solution 2.5 mg  2.5 mg Nebulization Q2H PRN Sheikh, Omair Latif, DO      . alum & mag hydroxide-simeth (MAALOX/MYLANTA) 200-200-20 MG/5ML suspension 30 mL  30 mL Oral Q4H PRN Irene Pap N, DO   30 mL at  03/22/19 0557  . amLODipine (NORVASC) tablet 10 mg  10 mg Oral Daily Irene Pap N, DO   10 mg at 03/24/19 1020  . buPROPion (WELLBUTRIN XL) 24 hr tablet 150 mg  150 mg Oral Daily Annell Canty, MD      . cholecalciferol (VITAMIN D3) tablet 1,000 Units  1,000 Units Oral Daily Raiford Noble [place], DO   1,000 Units at 03/22/19 1052  . cyclobenzaprine (FLEXERIL) tablet 10 mg  10 mg Oral TID PRN Raiford Noble Latif, DO   10 mg at 03/19/19 1625  . dextrose (GLUTOSE) 40 % oral gel 75 g  2 Tube Oral STAT Hall, Carole N, DO      . dextrose 10 % infusion   Intravenous Continuous Irene Pap N, DO 100 mL/hr at 03/24/19 1310    . DULoxetine (CYMBALTA) DR capsule 60 mg  60 mg Oral Daily Raiford Noble [place], DO   60 mg at 03/24/19 1021  .  enoxaparin (LOVENOX) injection 40 mg  40 mg Subcutaneous Q24H SheikhGeorgina Quint [place], DO   40 mg at 03/21/19 1721  . feeding supplement (ENSURE ENLIVE) (ENSURE ENLIVE) liquid 237 mL  237 mL Oral BID BM Hall, Carole N, DO      . ferrous sulfate tablet 325 mg  325 mg Oral BID Sheikh, Omair Latif, DO   325 mg at 03/22/19 1052  . folic acid (FOLVITE) tablet 1 mg  1 mg Oral QHS Sheikh, Georgina Quint [place], DO   1 mg at 62/69/48 5462  . folic acid (FOLVITE) tablet 2 mg  2 mg Oral Daily Raiford Noble [place], DO   2 mg at 03/22/19 1052  . guaiFENesin (MUCINEX) 12 hr tablet 1,200 mg  1,200 mg Oral BID Raiford Noble [place], DO   1,200 mg at 03/23/19 2025  . HYDROcodone-acetaminophen (NORCO/VICODIN) 5-325 MG per tablet 1-2 tablet  1-2 tablet Oral Q6H PRN Irene Pap N, DO      . loperamide (IMODIUM) capsule 2 mg  2 mg Oral PRN Irene Pap N, DO   2 mg at 03/24/19 0942  . methylPREDNISolone sodium succinate (SOLU-MEDROL) 40 mg/mL injection 40 mg  40 mg Intravenous Q24H Hall, Carole N, DO   40 mg at 03/24/19 1020  . metoprolol tartrate (LOPRESSOR) injection 2.5 mg  2.5 mg Intravenous Q4H PRN Irene Pap N, DO   2.5 mg at 03/21/19 0440  . metoprolol tartrate (LOPRESSOR) injection 5 mg  5 mg  Intravenous Q6H Hall, Carole N, DO   5 mg at 03/24/19 1217  . mycophenolate (CELLCEPT) capsule 1,000 mg  1,000 mg Oral BID Irene Pap N, DO   1,000 mg at 03/23/19 2159  . nystatin (MYCOSTATIN) 100000 UNIT/ML suspension 500,000 Units  5 mL Oral QID Raiford Noble [place], DO   500,000 Units at 03/24/19 1022  . ondansetron (ZOFRAN) tablet 4 mg  4 mg Oral Q6H PRN Raiford Noble Latif, DO       Or  . ondansetron [facility]) injection 4 mg  4 mg Intravenous Q6H PRN Raiford Noble [place], DO   4 mg at 03/24/19 0919  . oxyCODONE (Oxy IR/ROXICODONE) immediate release tablet 5 mg  5 mg Oral Q4H PRN Irene Pap N, DO   5 mg at 03/24/19 1027  . pantoprazole (PROTONIX) EC tablet 20 mg  20 mg Oral QHS Hall, Carole N, DO   20 mg at 03/23/19 2159  . promethazine (PHENERGAN) injection 12.5 mg  12.5 mg Intravenous Q6H PRN Irene Pap N, DO      . senna-docusate (Senokot-S) tablet 1 tablet  1 tablet Oral QHS PRN Sheikh, Omair Latif, DO      . senna-docusate (Senokot-S) tablet 2 tablet  2 tablet Oral QHS Hall, Carole N, DO      . tacrolimus (PROGRAF) capsule 1 mg  1 mg Oral BID Irene Pap N, DO   1 mg at 03/24/19 1021  . traMADol (ULTRAM) tablet 50 mg  50 mg Oral Q12H PRN Kayleen Memos, DO      . Vitamin D (Ergocalciferol) (DRISDOL) capsule 50,000 Units  50,000 Units Oral Once per day on Tue Thu Sheikh, Omair Latif, DO   50,000 Units at 03/21/19 7035    Musculoskeletal: Strength & Muscle Tone: not tested Gait & Station: not tested Patient leans: N/A  Psychiatric Specialty Exam: Physical Exam  Psychiatric: Her speech is normal. Judgment and thought content normal. Her affect is blunt. She is slowed. Cognition and memory are normal. She exhibits a depressed  mood.    Review of Systems  Psychiatric/Behavioral: Positive for depression.    Blood pressure (!) 131/100, pulse (!) 107, temperature 99.1 F (37.3 C), temperature source Oral, resp. rate 16, height 5\' 2"  (1.575 m), weight 56.1 kg, SpO2 98 %.Body mass  index is 22.62 kg/m.  General Appearance: Casual  Eye Contact:  Fair  Speech:  Clear and Coherent  Volume:  Decreased  Mood:  Dysphoric  Affect:  Constricted  Thought Process:  Coherent and Linear  Orientation:  Full (Time, Place, and Person)  Thought Content:  Logical  Suicidal Thoughts:  No  Homicidal Thoughts:  No  Memory:  Immediate;   Fair Recent;   Fair Remote;   Fair  Judgement:  Intact  Insight:  Fair  Psychomotor Activity:  Psychomotor Retardation  Concentration:  Concentration: Fair and Attention Span: Fair  Recall:  Good  Fund of Knowledge:  Good  Language:  Good  Akathisia:  No  Handed:  Right  AIMS (if indicated):     Assets:  Communication Skills Desire for Improvement  ADL's:  Intact  Cognition:  marginal  Sleep:   fair     Treatment Plan Summary: 23 y.o.femalewith medical history of Gout, HTN, Lupus, Raynaud's disease and depression who was admitted due to dehydration, general body weakness and LE swelling. Patient is calm, cooperative, reports depression secondary to medical issues but denies psychosis, delusions and SI/HI.  Recommendations: -Add Wellbutrin XL 150 mg daily for depression. -Continue Cymbalta 60 mg daily for depression(be aware of possibility of Serotonin syndrome when Cymbalta is combined with Opioid.)    Disposition: No evidence of imminent risk to self or others at present.   Patient does not meet criteria for psychiatric inpatient admission. Supportive therapy provided about ongoing stressors. Psychiatric service siging out. Re-consult as needed  This service was provided via telemedicine using a 2-way, interactive audio and video technology.  Names of all persons participating in this telemedicine service and their role in this encounter. Name: [name] [name] Role: patient  Name: Corena Pilgrim Role: psychiatrist    Corena Pilgrim, MD 03/24/2019 1:49 PM

## 2019-03-24 NOTE — Progress Notes (Signed)
Pt able to tolerate PO medications today, as well as soft diet for dinner. Pt refusing Cellcept, states it makes her vomit. Pt up to bathroom and BSC multiple times today. Psych talked with pt, ordered Wellbutrin, pt refused. Pt upset, stated she is not depressed. Pt up in chair majority of afternoon/evening. Will continue to monitor.

## 2019-03-24 NOTE — Significant Event (Signed)
Rapid Response Event Note  Overview: Time Called: 2130 Event Type: Other (Comment)(low CBG)  Initial Focused Assessment:  Called to room in reference to patient's CBG result of 13.  Arrived in room to find patient alert and oriented x 4, sitting on bedside commode.  Pt able to stand with steady gait and transfer herself back to the bed.  Repeated fingerstick CBG with result of less than 10.  Patient is asymptomatic, denies feeling shaky, neuro status intact, skin cool to the touch but dry.  Asked the primary nurse to get a stat Glucose level by lab draw.  Pt's VS stable, no distress.  Similar situation happened on dayshift earlier today and the dayshift rapid response was called, after giving some juice and D50 and starting a D10 gtt, the CBG by lab stick was stable but fingerstick was still low.  Patient has poor perfusion to her finger tips which might be interfering with the CBG results.  Lab CBG resulted 124 at 2207, which corresponds with the patient.   Interventions:  Rapid Response assessment, stat CBG by lab stick,   Plan of Care (if not transferred):  Due to two separate episodes of fingerstick CBG being low and not coordinating with lab results.  For future fingerstick CBGs still attempt to get by fingerstick but if results are low and pt asymptomatic, place a order for a stat random glucose per hypoglycemic protocol and follow protocol from the lab result.  If symptomatic, treat the patient first and get lab glucose back up.  Jacqulyn Ducking ICU/SD RN4 / Care Coordinator / Rapid Response Nurse Rapid Response Number:  [phone] ICU Charge Nurse Number:  [phone]

## 2019-03-25 ENCOUNTER — Inpatient Hospital Stay (HOSPITAL_COMMUNITY): Payer: BC Managed Care – PPO

## 2019-03-25 DIAGNOSIS — F0631 Mood disorder due to known physiological condition with depressive features: Secondary | ICD-10-CM

## 2019-03-25 LAB — BASIC METABOLIC PANEL
Anion gap: 8 (ref 5–15)
BUN: 14 mg/dL (ref 6–20)
CO2: 18 mmol/L — ABNORMAL LOW (ref 22–32)
Calcium: 8.8 mg/dL — ABNORMAL LOW (ref 8.9–10.3)
Chloride: 113 mmol/L — ABNORMAL HIGH (ref 98–111)
Creatinine, Ser: 0.79 mg/dL (ref 0.44–1.00)
GFR calc Af Amer: >60 mL/min (ref >60)
GFR calc non Af Amer: >60 mL/min (ref >60)
Glucose, Bld: 85 mg/dL (ref 70–99)
Potassium: 3.5 mmol/L (ref 3.5–5.1)
Sodium: 139 mmol/L (ref 135–145)

## 2019-03-25 LAB — GLUCOSE, CAPILLARY: Glucose-Capillary: 62 mg/dL — ABNORMAL LOW (ref 70–99)

## 2019-03-25 MED ORDER — PANTOPRAZOLE SODIUM 20 MG PO TBEC: 20.0000 mg | DELAYED_RELEASE_TABLET | Freq: Every day | ORAL | 0 refills | Status: DC

## 2019-03-25 MED ORDER — METOPROLOL TARTRATE 25 MG PO TABS: 12.5000 mg | ORAL_TABLET | Freq: Two times a day (BID) | ORAL | 0 refills | Status: DC

## 2019-03-25 MED ORDER — LOPERAMIDE HCL 2 MG PO CAPS: 2.0000 mg | ORAL_CAPSULE | ORAL | 0 refills | Status: DC | PRN

## 2019-03-25 MED ORDER — DULOXETINE HCL 60 MG PO CPEP: 60.0000 mg | ORAL_CAPSULE | Freq: Every day | ORAL | 0 refills | Status: DC

## 2019-03-25 MED ORDER — FOLIC ACID 1 MG PO TABS: 1.0000 mg | ORAL_TABLET | Freq: Every day | ORAL | 0 refills | Status: AC

## 2019-03-25 MED ORDER — MYCOPHENOLATE MOFETIL 250 MG PO CAPS: 1000.0000 mg | ORAL_CAPSULE | Freq: Two times a day (BID) | ORAL | 0 refills | Status: AC

## 2019-03-25 MED ORDER — AMLODIPINE BESYLATE 10 MG PO TABS: 10.0000 mg | ORAL_TABLET | Freq: Every day | ORAL | 0 refills | Status: DC

## 2019-03-25 MED ORDER — PREDNISONE 5 MG PO TABS: 10.0000 mg | ORAL_TABLET | Freq: Every day | ORAL | 0 refills | Status: AC

## 2019-03-25 MED ORDER — NYSTATIN 100000 UNIT/ML MT SUSP: 5.0000 mL | Freq: Two times a day (BID) | OROMUCOSAL | 0 refills | Status: DC | PRN

## 2019-03-25 MED ORDER — POTASSIUM CHLORIDE CRYS ER 20 MEQ PO TBCR: 40.0000 meq | EXTENDED_RELEASE_TABLET | Freq: Once | ORAL | Status: DC

## 2019-03-25 MED ORDER — TACROLIMUS 1 MG PO CAPS: 1.0000 mg | ORAL_CAPSULE | Freq: Two times a day (BID) | ORAL | 0 refills | Status: DC

## 2019-03-25 MED ORDER — BUPROPION HCL ER (XL) 150 MG PO TB24: 150.0000 mg | ORAL_TABLET | Freq: Every day | ORAL | 0 refills | Status: DC

## 2019-03-25 MED ORDER — CYCLOBENZAPRINE HCL 10 MG PO TABS: 10.0000 mg | ORAL_TABLET | Freq: Two times a day (BID) | ORAL | 0 refills | Status: DC | PRN

## 2019-03-25 NOTE — TOC Initial Note (Signed)
Transition of Care [facility]) - Initial/Assessment Note    Patient Details  Name: [name] [name] MRN: [id number] Date of Birth: [birth year]  Transition of Care (TOC) CM/SW Contact:    Joaquin Courts, RN Phone Number: 03/25/2019, 4:32 PM  Clinical Narrative:                 CM spoke with patient. Patient refused home health services. [employer] set up to deliver rolling walker and oxygen to patient's room  Expected Discharge Plan: Home/Self Care Barriers to Discharge: No Barriers Identified   Patient Goals and CMS Choice        Expected Discharge Plan and Services Expected Discharge Plan: Home/Self Care   Discharge Planning Services: CM Consult   Living arrangements for the past 2 months: Apartment Expected Discharge Date: 03/25/19               DME Arranged: Oxygen, Walker rolling DME Agency: [employer] Date DME Agency Contacted: 03/25/19 Time DME Agency Contacted: [id number] Representative spoke with at DME Agency: [place]: Patient Refused [place]          Prior Living Arrangements/Services Living arrangements for the past 2 months: Apartment Lives with:: Siblings Patient language and need for interpreter reviewed:: Yes Do you feel safe going back to the place where you live?: Yes      Need for Family Participation in Patient Care: Yes (Comment) Care giver support system in place?: Yes (comment)   Criminal Activity/Legal Involvement Pertinent to Current Situation/Hospitalization: No - Comment as needed  Activities of Daily Living Home Assistive Devices/Equipment: None ADL Screening (condition at time of admission) Patient's cognitive ability adequate to safely complete daily activities?: Yes Is the patient deaf or have difficulty hearing?: No Does the patient have difficulty seeing, even when wearing glasses/contacts?: No Does the patient have difficulty concentrating, remembering, or making decisions?: No Patient able to express need for assistance with ADLs?:  Yes Does the patient have difficulty dressing or bathing?: No Independently performs ADLs?: Yes (appropriate for developmental age) Does the patient have difficulty walking or climbing stairs?: No Weakness of Legs: Both Weakness of Arms/Hands: Both  Permission Sought/Granted                  Emotional Assessment Appearance:: Appears stated age Attitude/Demeanor/Rapport: Engaged Affect (typically observed): Accepting Orientation: : Oriented to Place, Oriented to  Time, Oriented to Situation, Oriented to Self   Psych Involvement: No (comment)  Admission diagnosis:  Viral syndrome [B34.9] SLE exacerbation (HCC) [M32.9] Dysphagia, unspecified type [R13.10] Lupus (systemic lupus erythematosus) ([place]) [M32.9] Patient Active Problem List   Diagnosis Date Noted  . 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 Lupus (systemic lupus erythematosus) ([place]) 03/19/2019  . Bronchitis 03/19/2019  . Essential hypertension 03/19/2019  . Elevated AST (SGOT) 03/19/2019  . Dehydration 03/19/2019  . Sinus tachycardia 03/19/2019  . Metabolic acidosis 96/22/2979  . Oral thrush 03/19/2019  . Relative acute adrenal insufficiency ([place]) 06/11/2018  . Anemia 06/09/2018  . Lobar pneumonia ([place]) 06/07/2018  . Pleuritic chest pain 06/05/2018  . Dysphagia 06/01/2018  . Odynophagia 06/01/2018  . Thrush 06/01/2018  . Mucositis oral 01/12/2018  . Vaginal candidiasis 01/12/2018  . Methotrexate toxicity   . Mouth ulcers   . Leukopenia 01/10/2018  . Accidental methotrexate overdose 01/08/2018  . Depression 02/08/2017  . Primary osteoarthritis of both knees 11/09/2016  . Chronic midline low back pain without sciatica 11/09/2016  . Vitamin D deficiency 11/09/2016  .  SLE (systemic lupus erythematosus) ([place]) 09/19/2016  . Insomnia 01/29/2016  . Depo-Provera contraceptive status 08/11/2015  . Raynaud's disease 08/11/2015  . Seasonal allergies 08/11/2015  .  IDA (iron deficiency anemia) 08/11/2015   PCP:  [name], FNP Pharmacy:   [id number] [place], [place] [address] Phone: [phone] Fax: [phone]  [id number] [place], [place] [address] [place] [address] Phone: [phone] Fax: [phone]     Social Determinants of Health (SDOH) Interventions    Readmission Risk Interventions Readmission Risk Prevention Plan 03/21/2019  Transportation Screening Complete  PCP or Specialist Appt within 3-5 Days Not Complete  Not Complete comments pt is established with PCP however DC date unknown  [place] or [place] Not Complete  HRI or Home Care Consult comments no indication  Social Work Consult for [place] Planning/Counseling Complete  Palliative Care Screening Not Applicable  Medication Review Press photographer) Complete  Some recent data might be hidden

## 2019-03-25 NOTE — Evaluation (Signed)
Physical Therapy Evaluation Patient Details Name: [name] [name] MRN: [id number] DOB: October 01, 1995 Today's Date: 03/25/2019   History of Present Illness  23 y.o. female with PMHx significant for but not limited to Gout, HTN, Lupus, Raynaud's disease and admitted for Generalized fatigue/arthralgia likely secondary to lupus flare  Clinical Impression  Pt admitted with above diagnosis. Pt currently with functional limitations due to the deficits listed below (see PT Problem List).  Pt will benefit from skilled PT to increase their independence and safety with mobility to allow discharge to the venue listed below.   Pt ambulated in hallway and limited by fatigue and SOB.  HR elevated to 135 bpm with activity and SPO2 86% on room air (RN notified).  Pt may benefit from f/u PT upon d/c if symptoms persist.     Follow Up Recommendations Home health PT(HH vs OP (per pt's ability/agreeable))    Equipment Recommendations  None recommended by PT(reports she has a RW at her grandmothers)    Recommendations for Other Services       Precautions / Restrictions Precautions Precautions: Fall Precaution Comments: monitor O2 and HR      Mobility  Bed Mobility               General bed mobility comments: pt up in recliner on arrival  Transfers Overall transfer level: Needs assistance Equipment used: Rolling walker (2 wheeled) Transfers: Sit to/from Stand Sit to Stand: Min guard         General transfer comment: verbal cues for hand placement  Ambulation/Gait Ambulation/Gait assistance: Min guard Gait Distance (Feet): 120 Feet Assistive device: Rolling walker (2 wheeled) Gait Pattern/deviations: Step-through pattern;Decreased stride length     General Gait Details: pt with slow cautious gait and utilized RW for UE support, HR up to 135 bpm during ambulation and SpO2 reading 86% on room air upon returning to room after ambulating; improved to 96% on room air with resting within a couple  minutes Investment banker, corporate notified)  Stairs            Wheelchair Mobility    Modified Rankin (Stroke Patients Only)       Balance                                             Pertinent Vitals/Pain Pain Assessment: No/denies pain    Home Living Family/patient expects to be discharged to:: Private residence Living Arrangements: Other relatives(lives with her sisters) Available Help at Discharge: Family;Available PRN/intermittently Type of Home: Apartment Home Access: Stairs to enter Entrance Stairs-Rails: Psychiatric nurse of Steps: flight Home Layout: One level Home Equipment: Environmental consultant - 2 wheels Additional Comments: plans to stay with her grandmother upon d/c    Prior Function Level of Independence: Independent               Hand Dominance        Extremity/Trunk Assessment        Lower Extremity Assessment Lower Extremity Assessment: Generalized weakness       Communication   Communication: No difficulties  Cognition Arousal/Alertness: Awake/alert Behavior During Therapy: Flat affect Overall Cognitive Status: Within Functional Limits for tasks assessed  General Comments      Exercises     Assessment/Plan    PT Assessment Patient needs continued PT services  PT Problem List Decreased strength;Decreased mobility;Decreased activity tolerance;Decreased balance;Decreased knowledge of use of DME;Cardiopulmonary status limiting activity       PT Treatment Interventions DME instruction;Therapeutic activities;Gait training;Therapeutic exercise;Patient/family education;Functional mobility training;Stair training;Balance training    PT Goals (Current goals can be found in the Care Plan section)  Acute Rehab PT Goals PT Goal Formulation: With patient Time For Goal Achievement: 04/08/19 Potential to Achieve Goals: Good    Frequency Min 3X/week   Barriers to discharge         Co-evaluation               AM-PAC PT "6 Clicks" Mobility  Outcome Measure Help needed turning from your back to your side while in a flat bed without using bedrails?: A Little Help needed moving from lying on your back to sitting on the side of a flat bed without using bedrails?: A Little Help needed moving to and from a bed to a chair (including a wheelchair)?: A Little Help needed standing up from a chair using your arms (e.g., wheelchair or bedside chair)?: A Little Help needed to walk in hospital room?: A Little Help needed climbing 3-5 steps with a railing? : A Little 6 Click Score: 18    End of Session   Activity Tolerance: Patient limited by fatigue Patient left: in chair;with call bell/phone within reach Nurse Communication: Mobility status PT Visit Diagnosis: Difficulty in walking, not elsewhere classified (R26.2)    Time: 1103-1594 PT Time Calculation (min) (ACUTE ONLY): 16 min   Charges:   PT Evaluation $PT Eval Low Complexity: [place], PT, DPT Acute Rehabilitation Services Office: [phone] Pager: [phone]  Trena Platt 03/25/2019, 12:34 PM

## 2019-03-25 NOTE — Progress Notes (Signed)
Physical therapy notified this RN of pt desaturation with short ambulation. Difficulty obtaining accurate O2 reading on pt over the weekend, this nurse was able to get accurate reading on 7/12 per sensor applied to ear lobe. PT stated patient O2 dropped to 86% when during ambulation of 152ft, and after a couple of minutes of rest, rose to 96%. MD made aware. Will continue to monitor.

## 2019-03-25 NOTE — Discharge Instructions (Signed)
Systemic Lupus Erythematosus, Adult Systemic lupus erythematosus (SLE) is a long-term (chronic) disease that can affect many parts of the body. SLE is an autoimmune disease. With this type of disease, the body's defense system (immune system) mistakenly attacks healthy tissues. This can cause damage to the skin, joints, blood vessels, brain, kidneys, lungs, heart, and other internal organs. It causes pain, irritation, and inflammation. What are the causes? The cause of this condition is not known. What increases the risk? The following factors may make you more likely to develop this condition:  Being female.  Being of Asian, Hispanic, or African-American descent.  Having a family history of the condition.  Being exposed to tobacco smoke or smoking cigarettes.  Having an infection with a virus, such as Epstein-Barr virus.  Having a history of exposure to silica dust, metals, chemicals, mold or mildew, or insecticides.  Using oral contraceptives or hormone replacement therapy. What are the signs or symptoms? This condition can affect almost any organ or system in the body. Symptoms of the condition depend on which organ or system is affected. The most common symptoms include:  Fever.  Fatigue.  Weight loss.  Muscle aches.  Joint pain.  Skin rashes, especially over the nose and cheeks (butterfly rash) and after sun exposure. Symptoms can come and go. A period of time when symptoms get worse or come back is called a flare. A period of time with no symptoms is called a remission. How is this diagnosed? This condition is diagnosed based on:  Your symptoms.  Your medical history.  A physical exam. You may also have tests, including:  Blood tests.  Urine tests.  A chest X-ray. You may be referred to an autoimmune disease specialist (rheumatologist). How is this treated? There is no cure for this condition, but treatment can help to control symptoms, prevent flares (keep  symptoms in remission), and prevent damage to the heart, lungs, kidneys, and other organs. Treatment will depend on what symptoms you are having and what organs or systems are affected. Treatment may involve taking a combination of medicines over time. Common medicines used to treat this condition include:  Antimalarial medicines to control symptoms, prevent flares, and protect against organ damage.  Corticosteroids and NSAIDs to reduce inflammation.  Medicines to weaken your immune system (immunosuppressants).  Biologic response modifiers to reduce inflammation and damage. Follow these instructions at home: Eating and drinking  Eat a heart-healthy diet. This may include: ? Eating high-fiber foods, such as fresh fruits and vegetables, whole grains, and beans. ? Eating heart-healthy fats (omega-3 fats), such as fish, flaxseed, and flaxseed oil. ? Limiting foods that are high in saturated fat and cholesterol, such as processed and fried foods, fatty meat, and full-fat dairy. ? Limiting how much salt (sodium) you eat.  Include calcium and vitamin D in your diet. Good sources of calcium and vitamin D include: ? Low-fat dairy products such as milk, yogurt, and cheese. ? Certain fish, such as fresh or canned salmon, tuna, and sardines. ? Products that have calcium and vitamin D added to them (fortified products), such as fortified cereals or juice. Medicines  Take over-the-counter and prescription medicines only as told by your health care provider.  Do not take any medicines that contain estrogen without first checking with your health care provider. Estrogen can trigger flares and may increase your risk for blood clots. Lifestyle      Stay active, as directed by your health care provider.  Do not use any products   that contain nicotine or tobacco, such as cigarettes and e-cigarettes. If you need help quitting, ask your health care provider.  Protect your skin from the sun by applying  sunblock and wearing protective hats and clothing.  Learn as much as you can about your condition and have a good support system in place. Support may come from family, friends, or a lupus support group. General instructions  Work closely with all of your health care providers to manage your condition.  Stay up to date on all vaccines as directed by your health care provider.  Keep all follow-up visits as told by your health care provider. This is important. Contact a health care provider if:  You have a fever.  Your symptoms flare.  You develop new symptoms.  You have bloody, foamy, or coffee-colored urine.  There are changes in your urination. For example, you urinate more often at night.  You think that you may be depressed or have anxiety.  You become pregnant or plan to become pregnant. Pregnancy in women with this condition is considered high risk. Get help right away if:  You have chest pain.  You have trouble breathing.  You have a seizure.  You suddenly get a very bad headache.  You suddenly develop facial or body weakness.  You cannot speak.  You cannot understand speech. These symptoms may represent a serious problem that is an emergency. Do not wait to see if the symptoms will go away. Get medical help right away. Call your local emergency services (911 in the [place] Do not drive yourself to the hospital. Summary  Systemic lupus erythematosus (SLE) is a long-term disease that can affect many parts of the body.  SLE is an autoimmune disease. That means your body's defense system (immune system) mistakenly attacks healthy tissues.  There is no cure for this condition, but treatment can help to control symptoms, prevent flares, and prevent damage to your organs. Treatment may involve taking a combination of medicines over time. This information is not intended to replace advice given to you by your health care provider. Make sure you discuss any questions you  have with your health care provider. Document Released: 08/19/2002 Document Revised: 10/12/2017 Document Reviewed: 10/06/2017 Elsevier Patient Education  2020 [employer]  

## 2019-03-25 NOTE — Progress Notes (Signed)
SATURATION QUALIFICATIONS: (This note is used to comply with regulatory documentation for home oxygen)  Patient Saturations on Room Air at Rest = 100%  Patient Saturations on Room Air while Ambulating = 99%  Patient Saturations on 0 Liters of oxygen while Ambulating = 99%  Please briefly explain why patient needs home oxygen: Reassessment of oxygen qualification.

## 2019-03-25 NOTE — Progress Notes (Signed)
SATURATION QUALIFICATIONS: (This note is used to comply with regulatory documentation for home oxygen)  Patient Saturations on Room Air at Rest = 98%  Patient Saturations on Room Air while Ambulating = 86%  Patient Saturations on 0 Liters of oxygen while Ambulating = 86%  Please briefly explain why patient needs home oxygen: Pt desats with activity.

## 2019-03-25 NOTE — Progress Notes (Signed)
Pt. 2126 CBG showed 13. Rapid response was called and orders were given to treat and orange juice and have Lab draw glucose level. Once Rapid response arrived second CBG was taken and it revealed less than 10. Pt. was Alert, oriented x4 and asymptomatic. Lab draw revealed true glucose was 124 at 2207. Pt. Might have poor perfusion in her hands due to history of Raynaud's syndrome and Lupus. Pt. have difficulties obtaining O2 sat in fingers as well. Rapid response suggest if 0800 CBG is low and pt. Is Asymptomatic put in order for lab to draw CBG levels,However, if pt. Is symptomatic, treat first then put in orders for lab to collect CBG levels.

## 2019-03-25 NOTE — Discharge Summary (Addendum)
Discharge Summary  [name] [name] ZTI:[id number] DOB: [birth year]  PCP: [name], FNP  Admit date: 03/19/2019 Discharge date: 03/25/2019  Time spent: 35 minutes  Recommendations for Outpatient Follow-up:  1. Follow-up with your rheumatologist Dr. [name].  Please call within the next 24 to 48 hours to set up an appointment. 2. Follow-up with your nephrologist. 3. Follow-up with your primary care provider. 4. Take your medications as prescribed.  Discharge Diagnoses:  Active Hospital Problems   Diagnosis Date Noted  . 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 Lupus (systemic lupus erythematosus) ([place]) 03/19/2019  . Bronchitis 03/19/2019  . Essential hypertension 03/19/2019  . Elevated AST (SGOT) 03/19/2019  . Dehydration 03/19/2019  . Sinus tachycardia 03/19/2019  . Metabolic acidosis 82/50/5397  . Oral thrush 03/19/2019  . Thrush 06/01/2018  . Depression 02/08/2017  . SLE (systemic lupus erythematosus) ([place]) 09/19/2016  . Seasonal allergies 08/11/2015  . Raynaud's disease 08/11/2015  . IDA (iron deficiency anemia) 08/11/2015    Resolved Hospital Problems  No resolved problems to display.    Discharge Condition: Stable  Diet recommendation: Regular diet.  Vitals:   03/25/19 0645 03/25/19 1300  BP: (!) 134/100 (!) 121/92  Pulse: 86 (!) 106  Resp: 16 16  Temp: (!) 97.1 F (36.2 C) 99.2 F (37.3 C)  SpO2: 100% 100%    History of present illness:   [name] [name] a 22 y.o.femalewith medical history significantfor Gout, HTN, Lupus nephritis, Raynaud's syndrome who presents with a CC of 3 day productive cough and worsening LE Swelling.  Associated with generalized weakness.  States symptoms feel like previous lupus flare.  TRH was asked to admit due to dehydration with poor oral intake, persistent cough and bilateral leg swelling.    SARS-CoV-2 Negative.  Discussed with Dr. [name] at [facility] via phone on  03/21/19, the patient's rheumatologist. Prograf was started by her nephrologist due to lupus nephritis. She was on metotrexate previously, stopped due to nephritis. He last saw her on 10/2018 and he had her on cellcept 1000 mg BID and prednisone for her lupus. Not on plaquenil due to allergy.  He recommended to continue IV steroids and once improved to taper down steroids and have close follow up with him outpatient in 1-2 days post discharge.   Active infective process ruled out, negative HCG, patient was restarted on Prograf and CellCept.  Hospital course complicated by watery stools, nausea, vomiting and diarrhea which have now resolved.  Patient is tolerating a diet without difficulty.  Erroneous asymptomatic hypoglycemia with CBGs readings in the tens.  Hypoglycemia ruled out by appropriately elevated serum glucose.  Erroneous hypoglycemia likely secondary to poor peripheral circulation from Raynaud syndrome.   03/25/19: Patient was seen and examined at her bedside this morning.  She denies chest pain, dyspnea, nausea, abdominal pain, lower extremity pain, diarrhea, she has no new complaints.  She states she feels well.  Reports she had one formed stool yesterday evening.  She is tolerating a diet without any difficulty.  She wants to go home.  On the day of discharge, the patient was hemodynamically stable.  She will need to follow-up with her rheumatologist Dr. [name] at [facility], nephrologist, and her primary care provider posthospitalization.  Patient understands and agrees to plan.     Hospital Course:  Principal Problem:   Depressive disorder due to another medical condition with depressive features Active Problems:   Raynaud's disease   Seasonal allergies   IDA (  iron deficiency anemia)   SLE (systemic lupus erythematosus) ([facility])   Depression   Thrush   Lupus (systemic lupus erythematosus) ([facility])   Bronchitis   Essential hypertension   Elevated AST (SGOT)   Dehydration    Sinus tachycardia   Metabolic acidosis   Oral thrush  Resolving generalized fatigue/arthralgia likely secondary to lupus flare Negative serum hCG Completed IV Solu-Medrol Continue Prograf, and CellCept Follow-up with your rheumatologist, nephrologist, and PCP. Allergic to hydroxychloroquine  Resolved watery stools, unclear etiology Possibly secondary to prior laxative use. Imodium as needed Self-reported formed stools yesterday evening  Erroneous asymptomatic hypoglycemia likely secondary to poor peripheral perfusion from Raynaud syndrome CBG as low as less than 10, asymptomatic. Fasting serum glucose 85 this morning Eat small frequent meals  Erroneous ambulatory hypoxia in the setting of Raynaud syndrome Home O2 eval 03/25/19: O2 sat 100% at rest on RA, 86% with ambulation. CXR no active disease or pulmonary edema Benign physical exam. Will repeat Home O2 evaluation.to reassess. Repeat Home O2 evaluation with results as followed: SATURATION QUALIFICATIONS: (This note is used to comply with regulatory documentation for home oxygen)  Patient Saturations on Room Air at Rest = 100%  Patient Saturations on Room Air while Ambulating = 99%  Patient Saturations on 0 Liters of oxygen while Ambulating = 99%  Please briefly explain why patient needs home oxygen: Reassessment of oxygen qualification.  Physical debility PT recommends HHPT Patient needs encouragement to participate Fall precautions Home with HHS; patient declines HHPT.  Chronic depression Prior psych illness: Depression Was not on antidepressant at admission Seen by psychiatrist inpatient due to depressed mood Continue Cymbalta 60 mg daily Started on Wellbutrin 03/24/2019 150 mg daily Recommend follow-up with your primary care provider and obtain psychiatry referral from your PCP.  Resolved elevated lactic acid post IV fluid hydration Peaked at 2.9 on 03/23/2019 >> 1.7 Afebrile with no leukocytosis  Procalcitonin negative 0.10 on 03/23/2019  Lupus nephritis Continue Prograf Follow-up with your nephrologist outpatient  Avoid IV dye use due to history of lupus nephritis Renal function is preserved, last creatinine 0.79 with GFR greater than 60.  Resolved acute bronchitis Independently reviewed chest x-ray done on admission which showed no lobular infiltrates COVID-19 testing negative Completed 5 days of Augmentin   Resolved SVT Asymptomatic TSH normal Continue metoprolol 12.5 mg twice daily Follow-up with your primary care provider outpatient  Resolved bilateral lower extremity edema and tenderness Erythema and edema have resolved Nontender to palpation today Negative Doppler ultrasound of lower extremities bilaterally, no DVT.  Raynaud's syndrome/hypertension Continue amlodipine increased dose to 10 mg p.o. daily  Resolved severe hypomagnesemia Magnesium 1.2 >>2.0>> 1.8 Follow-up with your primary care provider outpatient  Resolved acute dehydration secondary to poor oral intake post IV fluid hydration  Resolved acute urinary retention on bethanechol Received straight in and out cath x2 during this admission Received bethanechol as needed  Moderate protein calorie malnutrition Albumin 2.2 BMI 20 Received oral supplementation  Resolving non-anion gap metabolic acidosis Bicarb 18 >> 16 Anion gap 8 Follow-up with primary care provider.  Elevated AST -AST on Admission was 68 trended down to 63 Elevated alkaline phosphatase which could be multifactorial Follow-up with your primary care provider  Resolved oral Thrush -In the setting of Chronic Steroid Use -Continue Mycostatin 500,000 Units po 4 times daily -Given 1x of Fluconazole 150 mg po    Code Status:FULL CODE   Consults called:Discussed with the patient's rheumatologist as stated above.   Discharge Exam: BP (!) 121/92 (BP  Location: Right Arm)   Pulse (!) 106   Temp 99.2 F  (37.3 C) (Rectal)   Resp 16   Ht 5\' 2"  (1.575 m)   Wt 51.1 kg   SpO2 100%   BMI 20.60 kg/m  . General: 23 y.o. year-old female well developed well nourished in no acute distress.  Alert and oriented x3. . Cardiovascular: Regular rate and rhythm with no rubs or gallops.  No thyromegaly or JVD noted.   Marland Kitchen Respiratory: Clear to auscultation with no wheezes or rales. Good inspiratory effort. . Abdomen: Soft nontender nondistended with normal bowel sounds x4 quadrants. . Musculoskeletal: No lower extremity edema. 2/4 pulses in all 4 extremities. Marland Kitchen Psychiatry: Mood is appropriate for condition and setting  Discharge Instructions You were cared for by a hospitalist during your hospital stay. If you have any questions about your discharge medications or the care you received while you were in the hospital after you are discharged, you can call the unit and asked to speak with the hospitalist on call if the hospitalist that took care of you is not available. Once you are discharged, your primary care physician will handle any further medical issues. Please note that NO REFILLS for any discharge medications will be authorized once you are discharged, as it is imperative that you return to your primary care physician (or establish a relationship with a primary care physician if you do not have one) for your aftercare needs so that they can reassess your need for medications and monitor your lab values.   Allergies as of 03/25/2019      Reactions   Hydroxychloroquine Rash   Latex Rash      Medication List    TAKE these medications   amLODipine 10 MG tablet Commonly known as: NORVASC Take 1 tablet (10 mg total) by mouth daily. Start taking on: March 26, 2019 What changed:   medication strength  how much to take   buPROPion 150 MG 24 hr tablet Commonly known as: WELLBUTRIN XL Take 1 tablet (150 mg total) by mouth daily. Start taking on: March 26, 2019   cholecalciferol 25 MCG (1000 UT)  tablet Commonly known as: VITAMIN D3 Take 1,000 Units by mouth daily.   cyclobenzaprine 10 MG tablet Commonly known as: FLEXERIL Take 1 tablet (10 mg total) by mouth 2 (two) times daily as needed for muscle spasms. What changed: See the new instructions.   DULoxetine 60 MG capsule Commonly known as: CYMBALTA Take 1 capsule (60 mg total) by mouth daily. Start taking on: March 26, 2019 What changed:   medication strength  additional instructions   ferrous sulfate 325 (65 FE) MG tablet Take 1 tablet (325 mg total) by mouth 2 (two) times daily. With 1/2 glass of orange juice   folic acid 1 MG tablet Commonly known as: FOLVITE TAKE 2 TABLETS (2 MG TOTAL) BY MOUTH DAILY. What changed:   how much to take  when to take this  additional instructions   folic acid 1 MG tablet Commonly known as: FOLVITE Take 1 tablet (1 mg total) by mouth at bedtime. What changed: You were already taking a medication with the same name, and this prescription was added. Make sure you understand how and when to take each.   loperamide 2 MG capsule Commonly known as: IMODIUM Take 1 capsule (2 mg total) by mouth as needed for diarrhea or loose stools.   medroxyPROGESTERone 150 MG/ML injection Commonly known as: DEPO-PROVERA Inject 150 mg into the muscle  every 3 (three) months.   metoprolol tartrate 25 MG tablet Commonly known as: LOPRESSOR Take 0.5 tablets (12.5 mg total) by mouth 2 (two) times daily.   mycophenolate 250 MG capsule Commonly known as: CELLCEPT Take 4 capsules (1,000 mg total) by mouth 2 (two) times daily for 5 days.   nystatin 100000 UNIT/ML suspension Commonly known as: MYCOSTATIN Take 5 mLs (500,000 Units total) by mouth 2 (two) times daily as needed.   pantoprazole 20 MG tablet Commonly known as: PROTONIX Take 1 tablet (20 mg total) by mouth at bedtime. What changed: when to take this   predniSONE 5 MG tablet Commonly known as: DELTASONE Take 2 tablets (10 mg total)  by mouth daily with breakfast for 7 days. What changed: how much to take   tacrolimus 1 MG capsule Commonly known as: Prograf Take 1 capsule (1 mg total) by mouth 2 (two) times daily.   Vitamin D (Ergocalciferol) 1.25 MG (50000 UT) Caps capsule Commonly known as: DRISDOL Take 1 capsule (50,000 Units total) by mouth 2 (two) times a week.            Durable Medical Equipment  (From admission, onward)         Start     Ordered   03/25/19 1604  For home use only DME Walker  Once    Question:  Patient needs a walker to treat with the following condition  Answer:  Physical deconditioning   03/25/19 1604   03/25/19 1340  For home use only DME oxygen  Once    Question Answer Comment  Length of Need 6 Months   Mode or (Route) Nasal cannula   Liters per Minute 2   Frequency Only at night (stationary unit needed)   Oxygen conserving device Yes   Oxygen delivery system Gas      03/25/19 1340         Allergies  Allergen Reactions  . Hydroxychloroquine Rash  . Latex Rash   Follow-up Information    Thapa, Rupak, MD. Call in 1 day(s).   Specialty: Internal Medicine Why: Please call for a post hospital follow-up appointment. Contact information: [place] [address] [phone]        [name], [place]. Call in 1 day(s).   Specialty: Family Medicine Why: Please call for a post hospital follow-up appointment. Contact information: [place] [address] [phone]            The results of significant diagnostics from this hospitalization (including imaging, microbiology, ancillary and laboratory) are listed below for reference.    Significant Diagnostic Studies: Dg Chest Port 1 View  Result Date: 03/25/2019 CLINICAL DATA:  Dyspnea.  History of lupus. EXAM: PORTABLE CHEST 1 VIEW COMPARISON:  03/19/2019 FINDINGS: The heart size and mediastinal contours are within normal limits. Both lungs are clear. The visualized  skeletal structures are unremarkable. IMPRESSION: Normal chest x-ray. Electronically Signed   By: Marijo Sanes M.D.   On: 03/25/2019 14:24   Dg Chest Portable 1 View  Result Date: 03/19/2019 CLINICAL DATA:  Shortness of breath, general edema, fever and body aches for 2 days. EXAM: PORTABLE CHEST 1 VIEW COMPARISON:  CT chest 02/01/2019.  PA and lateral chest 06/09/2018. FINDINGS: Lungs are clear. Heart size is normal. No pneumothorax or pleural effusion. IMPRESSION: Negative chest. Electronically Signed   By: Inge Rise M.D.   On: 03/19/2019 08:37   Vas [place] Lower Extremity Venous (dvt)  Result Date:  03/23/2019  Lower Venous Study Indications: Edema.  Risk Factors: None identified. Comparison Study: No prior studies. Performing Technologist: Oliver Hum RVT  Examination Guidelines: A complete evaluation includes B-mode imaging, spectral Doppler, color Doppler, and power Doppler as needed of all accessible portions of each vessel. Bilateral testing is considered an integral part of a complete examination. Limited examinations for reoccurring indications may be performed as noted.  +---------+---------------+---------+-----------+----------+-------+ RIGHT    CompressibilityPhasicitySpontaneityPropertiesSummary +---------+---------------+---------+-----------+----------+-------+ CFV      Full           Yes      Yes                          +---------+---------------+---------+-----------+----------+-------+ SFJ      Full                                                 +---------+---------------+---------+-----------+----------+-------+ FV Prox  Full                                                 +---------+---------------+---------+-----------+----------+-------+ FV Mid   Full                                                 +---------+---------------+---------+-----------+----------+-------+ FV DistalFull                                                  +---------+---------------+---------+-----------+----------+-------+ PFV      Full                                                 +---------+---------------+---------+-----------+----------+-------+ POP      Full           Yes      Yes                          +---------+---------------+---------+-----------+----------+-------+ PTV      Full                                                 +---------+---------------+---------+-----------+----------+-------+ PERO     Full                                                 +---------+---------------+---------+-----------+----------+-------+   +---------+---------------+---------+-----------+----------+-------+ LEFT     CompressibilityPhasicitySpontaneityPropertiesSummary +---------+---------------+---------+-----------+----------+-------+ CFV      Full           Yes      Yes                          +---------+---------------+---------+-----------+----------+-------+  SFJ      Full                                                 +---------+---------------+---------+-----------+----------+-------+ FV Prox  Full                                                 +---------+---------------+---------+-----------+----------+-------+ FV Mid   Full                                                 +---------+---------------+---------+-----------+----------+-------+ FV DistalFull                                                 +---------+---------------+---------+-----------+----------+-------+ PFV      Full                                                 +---------+---------------+---------+-----------+----------+-------+ POP      Full           Yes      Yes                          +---------+---------------+---------+-----------+----------+-------+ PTV      Full                                                 +---------+---------------+---------+-----------+----------+-------+ PERO     Full                                                  +---------+---------------+---------+-----------+----------+-------+     Summary: Right: There is no evidence of deep vein thrombosis in the lower extremity. No cystic structure found in the popliteal fossa. Left: There is no evidence of deep vein thrombosis in the lower extremity. No cystic structure found in the popliteal fossa.  *See table(s) above for measurements and observations. Electronically signed by Ruta Hinds MD on 03/23/2019 at 10:32:51 AM.    Final     Microbiology: Recent Results (from the past 240 hour(s))  Culture, blood (Routine x 2)     Status: None   Collection Time: 03/19/19  8:08 AM   Specimen: BLOOD  Result Value Ref Range Status   Specimen Description   Final    BLOOD RIGHT ANTECUBITAL Performed at [facility], [address], [place], [place] [address]    Special Requests   Final    BOTTLES DRAWN AEROBIC AND ANAEROBIC Blood Culture adequate volume Performed at [place] [address]  [place], [place] [address]    Culture   Final    NO GROWTH 5 DAYS Performed at [facility], [place] [address], [place], [place] [address]    Report Status 03/24/2019 FINAL  Final  SARS Coronavirus 2 (CEPHEID- Performed in [place] hospital lab), Hosp Order     Status: None   Collection Time: 03/19/19  8:50 AM   Specimen: Nasopharyngeal Swab  Result Value Ref Range Status   SARS Coronavirus 2 NEGATIVE NEGATIVE Final    Comment: (NOTE) If result is NEGATIVE SARS-CoV-2 target nucleic acids are NOT DETECTED. The SARS-CoV-2 RNA is generally detectable in upper and lower  respiratory specimens during the acute phase of infection. The lowest  concentration of SARS-CoV-2 viral copies this assay can detect is 250  copies / mL. A negative result does not preclude SARS-CoV-2 infection  and should not be used as the sole basis for treatment or other  patient management decisions.  A negative  result may occur with  improper specimen collection / handling, submission of specimen other  than nasopharyngeal swab, presence of viral mutation(s) within the  areas targeted by this assay, and inadequate number of viral copies  (<250 copies / mL). A negative result must be combined with clinical  observations, patient history, and epidemiological information. If result is POSITIVE SARS-CoV-2 target nucleic acids are DETECTED. The SARS-CoV-2 RNA is generally detectable in upper and lower  respiratory specimens dur ing the acute phase of infection.  Positive  results are indicative of active infection with SARS-CoV-2.  Clinical  correlation with patient history and other diagnostic information is  necessary to determine patient infection status.  Positive results do  not rule out bacterial infection or co-infection with other viruses. If result is PRESUMPTIVE POSTIVE SARS-CoV-2 nucleic acids MAY BE PRESENT.   A presumptive positive result was obtained on the submitted specimen  and confirmed on repeat testing.  While 2019 novel coronavirus  (SARS-CoV-2) nucleic acids may be present in the submitted sample  additional confirmatory testing may be necessary for epidemiological  and / or clinical management purposes  to differentiate between  SARS-CoV-2 and other Sarbecovirus currently known to infect humans.  If clinically indicated additional testing with an alternate test  methodology [phone]) is advised. The SARS-CoV-2 RNA is generally  detectable in upper and lower respiratory sp ecimens during the acute  phase of infection. The expected result is Negative. Fact Sheet for Patients:  StrictlyIdeas.no Fact Sheet for Healthcare Providers: BankingDealers.co.za This test is not yet approved or cleared by the [place] FDA and has been authorized for detection and/or diagnosis of SARS-CoV-2 by FDA under an Emergency Use Authorization  (EUA).  This EUA will remain in effect (meaning this test can be used) for the duration of the COVID-19 declaration under Section 564(b)(1) of the Act, 21 U.S.C. section 360bbb-3(b)(1), unless the authorization is terminated or revoked sooner. Performed at [facility], [place] [address], [place], [place] [address]   Urine culture     Status: None   Collection Time: 03/19/19  3:15 PM   Specimen: Urine, Random  Result Value Ref Range Status   Specimen Description   Final    URINE, RANDOM Performed at [place] [address] [place], [place] [address]    Special Requests   Final    NONE Performed at [facility], [place] [address] [place], [place] [address]    Culture   Final    NO  GROWTH Performed at [facility], [place] [address], [place], [place] [address]    Report Status 03/21/2019 FINAL  Final     Labs: Basic Metabolic Panel: Recent Labs  Lab 03/20/19 0500 03/22/19 0808 03/24/19 0510 03/24/19 1308 03/24/19 2207 03/25/19 0804  NA 136 138 136 137  --  139  K 4.0 3.7 3.7 3.3*  --  3.5  CL 112* 109 111 112*  --  113*  CO2 18* 18* 13* 16*  --  18*  GLUCOSE 83 88 89 208* 124* 85  BUN 9 12 17 15   --  14  CREATININE 0.59 0.54 1.01* 0.93  --  0.79  CALCIUM 7.7* 8.4* 8.7* 8.7*  --  8.8*  MG 1.2* 2.0 1.8  --   --   --   PHOS 3.7  --  3.3  --   --   --    Liver Function Tests: Recent Labs  Lab 03/19/19 0822 03/20/19 0500 03/24/19 0510  AST 68* 63* 62*  ALT 31 25 29   ALKPHOS 120 95 135*  BILITOT 0.3 0.5 0.3  PROT 7.6 6.8 6.8  ALBUMIN 2.7* 2.2* 2.5*   No results for input(s): LIPASE, AMYLASE in the last 168 hours. No results for input(s): AMMONIA in the last 168 hours. CBC: Recent Labs  Lab 03/19/19 0822 03/20/19 0500 03/23/19 1919 03/24/19 1053  WBC 4.9 4.2 6.9 7.1  NEUTROABS 4.1  --  4.8 4.7  HGB 11.4* 10.6* 12.6 11.7*  HCT 36.0 33.6* 40.5 37.7  MCV 90.9 90.6 90.0 89.3  PLT 157 103* 131*  137*   Cardiac Enzymes: No results for input(s): CKTOTAL, CKMB, CKMBINDEX, TROPONINI in the last 168 hours. BNP: BNP (last 3 results) No results for input(s): BNP in the last 8760 hours.  ProBNP (last 3 results) No results for input(s): PROBNP in the last 8760 hours.  CBG: Recent Labs  Lab 03/24/19 1400 03/24/19 1642 03/24/19 2126 03/24/19 2140 03/25/19 0816  GLUCAP 118* 99 13* <10* 62*       Signed:  Kayleen Memos, MD [facility] 03/25/2019, 4:54 PM

## 2019-04-01 DIAGNOSIS — G8929 Other chronic pain: Secondary | ICD-10-CM | POA: Diagnosis not present

## 2019-04-01 DIAGNOSIS — Z79899 Other long term (current) drug therapy: Secondary | ICD-10-CM | POA: Diagnosis not present

## 2019-04-01 DIAGNOSIS — M329 Systemic lupus erythematosus, unspecified: Secondary | ICD-10-CM | POA: Diagnosis not present

## 2019-04-01 DIAGNOSIS — I7300 Raynaud's syndrome without gangrene: Secondary | ICD-10-CM | POA: Diagnosis not present

## 2019-04-01 DIAGNOSIS — M3214 Glomerular disease in systemic lupus erythematosus: Secondary | ICD-10-CM | POA: Diagnosis not present

## 2019-04-03 ENCOUNTER — Telehealth: Payer: Self-pay

## 2019-04-03 NOTE — Telephone Encounter (Signed)
Patient called and is asking for a refill on hydrocodone. I don't see this on current list. Please advise. I advised here you would be out of the office until Monday. Thanks!

## 2019-04-03 NOTE — Telephone Encounter (Signed)
Error

## 2019-04-08 NOTE — Telephone Encounter (Signed)
Called and spoke with patient, advised that refill for hydrocodone has been denied. Patient has an appointment with [name] on 04/10/2019 and will discuss this during appointment. Thanks!~

## 2019-04-08 NOTE — Telephone Encounter (Signed)
She was started on this medication in the ED and while hospitalized. I have not prescribed for her in the past. It is not recommended that she be on hydrocodone with cymbalta due to increased risk of serotonin syndrome.

## 2019-04-10 ENCOUNTER — Other Ambulatory Visit: Payer: Self-pay

## 2019-04-10 ENCOUNTER — Ambulatory Visit (INDEPENDENT_AMBULATORY_CARE_PROVIDER_SITE_OTHER): Payer: BC Managed Care – PPO | Admitting: Family Medicine

## 2019-04-10 ENCOUNTER — Encounter: Payer: Self-pay | Admitting: Family Medicine

## 2019-04-10 VITALS — BP 107/68 | HR 109 | Temp 99.4°F | Resp 16 | Ht 63.0 in | Wt 116.0 lb

## 2019-04-10 DIAGNOSIS — I10 Essential (primary) hypertension: Secondary | ICD-10-CM | POA: Diagnosis not present

## 2019-04-10 DIAGNOSIS — R634 Abnormal weight loss: Secondary | ICD-10-CM

## 2019-04-10 DIAGNOSIS — D509 Iron deficiency anemia, unspecified: Secondary | ICD-10-CM | POA: Diagnosis not present

## 2019-04-10 DIAGNOSIS — M170 Bilateral primary osteoarthritis of knee: Secondary | ICD-10-CM

## 2019-04-10 DIAGNOSIS — K1230 Oral mucositis (ulcerative), unspecified: Secondary | ICD-10-CM

## 2019-04-10 DIAGNOSIS — F0631 Mood disorder due to known physiological condition with depressive features: Secondary | ICD-10-CM

## 2019-04-10 DIAGNOSIS — M329 Systemic lupus erythematosus, unspecified: Secondary | ICD-10-CM

## 2019-04-10 DIAGNOSIS — I7300 Raynaud's syndrome without gangrene: Secondary | ICD-10-CM | POA: Diagnosis not present

## 2019-04-10 MED ORDER — MUPIROCIN 2 % EX OINT: 1.0000 "application " | TOPICAL_OINTMENT | Freq: Two times a day (BID) | CUTANEOUS | 0 refills | Status: DC

## 2019-04-10 MED ORDER — HYDROCODONE-ACETAMINOPHEN 5-325 MG PO TABS: 1.0000 | ORAL_TABLET | Freq: Four times a day (QID) | ORAL | 0 refills | Status: AC | PRN

## 2019-04-10 MED ORDER — GABAPENTIN 300 MG PO CAPS: 300.0000 mg | ORAL_CAPSULE | Freq: Three times a day (TID) | ORAL | 3 refills | Status: DC

## 2019-04-10 NOTE — Progress Notes (Signed)
Patient [facility]   Progress Note: General Provider: [name], FNP  SUBJECTIVE:   [name] [name] is a 23 y.o. female who  has a past medical history of Gout, Hypertension, Lupus ([place]), and Raynaud disease.. Patient presents today for Hospitalization Follow-up and Medication Problem (cymbalta not helping )  Patient followed by Dr. Graylin [name] in [facility]. 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  Patient states that she has had mild improvement in mood. She states that the cymbalta has not helped with her pain. She presents with her grandmother who states that she notices that [name] [name] has more swelling and joint pain with eating protein. She would like to have her meet with a nutritionist and to have food allergy testing.  Patient also states that the sores in her nasal passages have pus-like drainage for the past 4 days. She states that she has been using flonase to help with congestion.  Review of Systems  Constitutional: Negative.   HENT: Negative.        Mucositis in the nasal passages.    Eyes: Negative.   Respiratory: Negative.   Cardiovascular: Negative.   Gastrointestinal: Negative.   Genitourinary: Negative.   Musculoskeletal: Positive for joint pain and myalgias.  Skin: Negative.   Neurological: Positive for tingling (bilateral feet) and weakness.  Psychiatric/Behavioral: Positive for depression. Negative for suicidal ideas. The patient is not nervous/anxious and does not have insomnia.      OBJECTIVE: BP 107/68 (BP Location: Right Arm, Patient Position: Sitting, Cuff Size: Normal)   Pulse (!) 109   Temp 99.4 F (37.4 C) (Oral)   Resp 16   Ht 5\' 3"  (1.6 m)   Wt 116 lb (52.6 kg)   SpO2 100%   BMI 20.55 kg/m   Wt Readings from Last 3 Encounters:  04/10/19 116 lb (52.6 kg)  03/25/19 112 lb 10.5 oz (51.1 kg)  02/06/19 120 lb  (54.4 kg)     Physical Exam Vitals signs and nursing note reviewed.  Constitutional:      General: She is not in acute distress.    Appearance: Normal appearance.  HENT:     Head: Normocephalic and atraumatic.     Nose: Mucosal edema (various open sores noted to the septum bilaterally) present.  Eyes:     Extraocular Movements: Extraocular movements intact.     Conjunctiva/sclera: Conjunctivae normal.     Pupils: Pupils are equal, round, and reactive to light.  Cardiovascular:     Rate and Rhythm: Normal rate and regular rhythm.     Heart sounds: No murmur.  Pulmonary:     Effort: Pulmonary effort is normal.     Breath sounds: Normal breath sounds.  Musculoskeletal: Normal range of motion.  Skin:    General: Skin is warm and dry.  Neurological:     Mental Status: She is alert and oriented to person, place, and time.     Gait: Gait abnormal (needs assistance with positional changes. ).  Psychiatric:        Attention and Perception: Attention normal.        Mood and Affect: Mood is depressed (improving. More interactive during the encounter. ).        Speech: Speech normal.        Behavior: Behavior normal.        Thought Content: Thought content normal.  Cognition and Memory: Cognition and memory normal.        Judgment: Judgment normal.     ASSESSMENT/PLAN:  1. Raynaud's disease without gangrene  2. Essential hypertension  3. Iron deficiency anemia, unspecified iron deficiency anemia type  4. 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 Systemic lupus erythematosus, unspecified SLE type, unspecified organ involvement status ([place])* - Amb ref to Medical Nutrition Therapy-MNT - Ambulatory referral to Pain Clinic - Ambulatory referral to Nephrology  6. Recent weight loss - Food Allergy Profile - Gluten Sensitivity Screen  7. Primary osteoarthritis of both knees - HYDROcodone-acetaminophen (NORCO/VICODIN) 5-325 MG tablet;  Take 1 tablet by mouth every 6 (six) hours as needed for up to 15 days.  Dispense: 30 tablet; Refill: 0   8. Mucositis - mupirocin ointment (BACTROBAN) 2 %; Place 1 application into the nose 2 (two) times daily.  Dispense: 30 g; Refill: 0  Continue with current medications. Added gabapentin to help with neuropathy and mood. Added mupirocin for mucositis. D/c flonase as it is exacerbating the dry nasal mucosa. Pain clinic referral for further evaluation and management of pain.    Return in about 4 weeks (around 05/08/2019) for Lupus.    The patient was given clear instructions to go to ER or return to medical center if symptoms do not improve, worsen or new problems develop. The patient verbalized understanding and agreed with plan of care.   [name], FNP-BC Patient [facility] [address] [place], [place] [address] [phone]

## 2019-04-10 NOTE — Patient Instructions (Signed)
Systemic Lupus Erythematosus, Adult  Systemic lupus erythematosus (SLE) is a long-term (chronic) disease that can affect many parts of the body. SLE is an autoimmune disease. With this type of disease, the body's defense system (immune system) mistakenly attacks healthy tissues. This can cause damage to the skin, joints, blood vessels, brain, kidneys, lungs, heart, and other internal organs. It causes pain, irritation, and inflammation.  What are the causes?  The cause of this condition is not known.  What increases the risk?  The following factors may make you more likely to develop this condition:  · Being female.  · Being of Asian, Hispanic, or African-American descent.  · Having a family history of the condition.  · Being exposed to tobacco smoke or smoking cigarettes.  · Having an infection with a virus, such as Epstein-Barr virus.  · Having a history of exposure to silica dust, metals, chemicals, mold or mildew, or insecticides.  · Using oral contraceptives or hormone replacement therapy.  What are the signs or symptoms?  This condition can affect almost any organ or system in the body. Symptoms of the condition depend on which organ or system is affected.  The most common symptoms include:  · Fever.  · Fatigue.  · Weight loss.  · Muscle aches.  · Joint pain.  · Skin rashes, especially over the nose and cheeks (butterfly rash) and after sun exposure.  Symptoms can come and go. A period of time when symptoms get worse or come back is called a flare. A period of time with no symptoms is called a remission.  How is this diagnosed?  This condition is diagnosed based on:  · Your symptoms.  · Your medical history.  · A physical exam.  You may also have tests, including:  · Blood tests.  · Urine tests.  · A chest X-ray.  You may be referred to an autoimmune disease specialist (rheumatologist).  How is this treated?  There is no cure for this condition, but treatment can help to control symptoms, prevent flares (keep  symptoms in remission), and prevent damage to the heart, lungs, kidneys, and other organs. Treatment will depend on what symptoms you are having and what organs or systems are affected. Treatment may involve taking a combination of medicines over time.  Common medicines used to treat this condition include:  · Antimalarial medicines to control symptoms, prevent flares, and protect against organ damage.  · Corticosteroids and NSAIDs to reduce inflammation.  · Medicines to weaken your immune system (immunosuppressants).  · Biologic response modifiers to reduce inflammation and damage.  Follow these instructions at home:  Eating and drinking  · Eat a heart-healthy diet. This may include:  ? Eating high-fiber foods, such as fresh fruits and vegetables, whole grains, and beans.  ? Eating heart-healthy fats (omega-3 fats), such as fish, flaxseed, and flaxseed oil.  ? Limiting foods that are high in saturated fat and cholesterol, such as processed and fried foods, fatty meat, and full-fat dairy.  ? Limiting how much salt (sodium) you eat.  · Include calcium and vitamin D in your diet. Good sources of calcium and vitamin D include:  ? Low-fat dairy products such as milk, yogurt, and cheese.  ? Certain fish, such as fresh or canned salmon, tuna, and sardines.  ? Products that have calcium and vitamin D added to them (fortified products), such as fortified cereals or juice.  Medicines  · Take over-the-counter and prescription medicines only as told by your health   that contain nicotine or tobacco, such as cigarettes and e-cigarettes. If you need help quitting, ask your health care provider.  Protect your skin from the sun by applying  sunblock and wearing protective hats and clothing.  Learn as much as you can about your condition and have a good support system in place. Support may come from family, friends, or a lupus support group. General instructions  Work closely with all of your health care providers to manage your condition.  Stay up to date on all vaccines as directed by your health care provider.  Keep all follow-up visits as told by your health care provider. This is important. Contact a health care provider if:  You have a fever.  Your symptoms flare.  You develop new symptoms.  You have bloody, foamy, or coffee-colored urine.  There are changes in your urination. For example, you urinate more often at night.  You think that you may be depressed or have anxiety.  You become pregnant or plan to become pregnant. Pregnancy in women with this condition is considered high risk. Get help right away if:  You have chest pain.  You have trouble breathing.  You have a seizure.  You suddenly get a very bad headache.  You suddenly develop facial or body weakness.  You cannot speak.  You cannot understand speech. These symptoms may represent a serious problem that is an emergency. Do not wait to see if the symptoms will go away. Get medical help right away. Call your local emergency services (911 in the [place] Do not drive yourself to the hospital. Summary  Systemic lupus erythematosus (SLE) is a long-term disease that can affect many parts of the body.  SLE is an autoimmune disease. That means your body's defense system (immune system) mistakenly attacks healthy tissues.  There is no cure for this condition, but treatment can help to control symptoms, prevent flares, and prevent damage to your organs. Treatment may involve taking a combination of medicines over time. This information is not intended to replace advice given to you by your health care provider. Make sure you discuss any questions you  have with your health care provider. Document Released: 08/19/2002 Document Revised: 10/12/2017 Document Reviewed: 10/06/2017 [employer] Patient Education  2020 [employer]  

## 2019-04-19 ENCOUNTER — Encounter (HOSPITAL_COMMUNITY): Payer: Self-pay

## 2019-04-19 ENCOUNTER — Emergency Department (HOSPITAL_COMMUNITY): Payer: BC Managed Care – PPO

## 2019-04-19 ENCOUNTER — Other Ambulatory Visit: Payer: Self-pay

## 2019-04-19 ENCOUNTER — Observation Stay (HOSPITAL_COMMUNITY)
Admission: EM | Admit: 2019-04-19 | Discharge: 2019-04-22 | Disposition: A | Discharge status: Home / Self Care | Payer: BC Managed Care – PPO | Attending: Family Medicine | Admitting: Family Medicine

## 2019-04-19 DIAGNOSIS — Z20828 Contact with and (suspected) exposure to other viral communicable diseases: Secondary | ICD-10-CM | POA: Insufficient documentation

## 2019-04-19 DIAGNOSIS — D899 Disorder involving the immune mechanism, unspecified: Secondary | ICD-10-CM

## 2019-04-19 DIAGNOSIS — M3219 Other organ or system involvement in systemic lupus erythematosus: Secondary | ICD-10-CM

## 2019-04-19 DIAGNOSIS — R21 Rash and other nonspecific skin eruption: Principal | ICD-10-CM

## 2019-04-19 DIAGNOSIS — Z79899 Other long term (current) drug therapy: Secondary | ICD-10-CM | POA: Insufficient documentation

## 2019-04-19 DIAGNOSIS — I7300 Raynaud's syndrome without gangrene: Secondary | ICD-10-CM | POA: Insufficient documentation

## 2019-04-19 DIAGNOSIS — D649 Anemia, unspecified: Secondary | ICD-10-CM | POA: Insufficient documentation

## 2019-04-19 DIAGNOSIS — R300 Dysuria: Secondary | ICD-10-CM

## 2019-04-19 DIAGNOSIS — R740 Nonspecific elevation of levels of transaminase and lactic acid dehydrogenase [LDH]: Secondary | ICD-10-CM | POA: Insufficient documentation

## 2019-04-19 DIAGNOSIS — N390 Urinary tract infection, site not specified: Secondary | ICD-10-CM

## 2019-04-19 DIAGNOSIS — Z8249 Family history of ischemic heart disease and other diseases of the circulatory system: Secondary | ICD-10-CM | POA: Insufficient documentation

## 2019-04-19 DIAGNOSIS — Z888 Allergy status to other drugs, medicaments and biological substances status: Secondary | ICD-10-CM | POA: Insufficient documentation

## 2019-04-19 DIAGNOSIS — Z7952 Long term (current) use of systemic steroids: Secondary | ICD-10-CM | POA: Insufficient documentation

## 2019-04-19 DIAGNOSIS — R220 Localized swelling, mass and lump, head: Secondary | ICD-10-CM | POA: Insufficient documentation

## 2019-04-19 DIAGNOSIS — R000 Tachycardia, unspecified: Secondary | ICD-10-CM | POA: Insufficient documentation

## 2019-04-19 DIAGNOSIS — M329 Systemic lupus erythematosus, unspecified: Secondary | ICD-10-CM

## 2019-04-19 DIAGNOSIS — Z793 Long term (current) use of hormonal contraceptives: Secondary | ICD-10-CM | POA: Insufficient documentation

## 2019-04-19 DIAGNOSIS — R600 Localized edema: Secondary | ICD-10-CM

## 2019-04-19 DIAGNOSIS — I10 Essential (primary) hypertension: Secondary | ICD-10-CM | POA: Insufficient documentation

## 2019-04-19 DIAGNOSIS — D709 Neutropenia, unspecified: Secondary | ICD-10-CM | POA: Insufficient documentation

## 2019-04-19 DIAGNOSIS — N3001 Acute cystitis with hematuria: Secondary | ICD-10-CM

## 2019-04-19 DIAGNOSIS — Z91040 Latex allergy status: Secondary | ICD-10-CM | POA: Insufficient documentation

## 2019-04-19 DIAGNOSIS — D849 Immunodeficiency, unspecified: Secondary | ICD-10-CM

## 2019-04-19 DIAGNOSIS — M3214 Glomerular disease in systemic lupus erythematosus: Secondary | ICD-10-CM | POA: Insufficient documentation

## 2019-04-19 HISTORY — DX: Dysuria: R30.0

## 2019-04-19 LAB — COMPREHENSIVE METABOLIC PANEL
ALT: 18 U/L (ref 0–44)
AST: 49 U/L — ABNORMAL HIGH (ref 15–41)
Albumin: 2.4 g/dL — ABNORMAL LOW (ref 3.5–5.0)
Alkaline Phosphatase: 100 U/L (ref 38–126)
Anion gap: 8 (ref 5–15)
BUN: 12 mg/dL (ref 6–20)
CO2: 22 mmol/L (ref 22–32)
Calcium: 8.3 mg/dL — ABNORMAL LOW (ref 8.9–10.3)
Chloride: 107 mmol/L (ref 98–111)
Creatinine, Ser: 0.6 mg/dL (ref 0.44–1.00)
GFR calc Af Amer: >60 mL/min (ref >60)
GFR calc non Af Amer: >60 mL/min (ref >60)
Glucose, Bld: 88 mg/dL (ref 70–99)
Potassium: 3.8 mmol/L (ref 3.5–5.1)
Sodium: 137 mmol/L (ref 135–145)
Total Bilirubin: 0.2 mg/dL — ABNORMAL LOW (ref 0.3–1.2)
Total Protein: 7 g/dL (ref 6.5–8.1)

## 2019-04-19 LAB — CBC WITH DIFFERENTIAL/PLATELET
Abs Immature Granulocytes: 0.05 10*3/uL (ref 0.00–0.07)
Basophils Absolute: 0 10*3/uL (ref 0.0–0.1)
Basophils Relative: 0 %
Eosinophils Absolute: 0 10*3/uL (ref 0.0–0.5)
Eosinophils Relative: 0 %
HCT: 30.4 % — ABNORMAL LOW (ref 36.0–46.0)
Hemoglobin: 9.3 g/dL — ABNORMAL LOW (ref 12.0–15.0)
Immature Granulocytes: 2 %
Lymphocytes Relative: 20 %
Lymphs Abs: 0.6 10*3/uL — ABNORMAL LOW (ref 0.7–4.0)
MCH: 28.2 pg (ref 26.0–34.0)
MCHC: 30.6 g/dL (ref 30.0–36.0)
MCV: 92.1 fL (ref 80.0–100.0)
Monocytes Absolute: 0.1 10*3/uL (ref 0.1–1.0)
Monocytes Relative: 5 %
Neutro Abs: 2 10*3/uL (ref 1.7–7.7)
Neutrophils Relative %: 73 %
Platelets: 161 10*3/uL (ref 150–400)
RBC: 3.3 MIL/uL — ABNORMAL LOW (ref 3.87–5.11)
RDW: 15 % (ref 11.5–15.5)
WBC: 2.7 10*3/uL — ABNORMAL LOW (ref 4.0–10.5)
nRBC: 0 % (ref 0.0–0.2)

## 2019-04-19 LAB — URINALYSIS, ROUTINE W REFLEX MICROSCOPIC
Bilirubin Urine: NEGATIVE
Glucose, UA: NEGATIVE mg/dL
Hgb urine dipstick: NEGATIVE
Ketones, ur: NEGATIVE mg/dL
Nitrite: NEGATIVE
Protein, ur: >=300 mg/dL — AB
Specific Gravity, Urine: 1.023 (ref 1.005–1.030)
pH: 6 (ref 5.0–8.0)

## 2019-04-19 LAB — I-STAT BETA HCG BLOOD, ED (MC, WL, AP ONLY): I-stat hCG, quantitative: <5 m[IU]/mL (ref <5)

## 2019-04-19 LAB — SARS CORONAVIRUS 2 BY RT PCR (HOSPITAL ORDER, PERFORMED IN ~~LOC~~ HOSPITAL LAB): SARS Coronavirus 2: NEGATIVE

## 2019-04-19 MED ORDER — BUPROPION HCL ER (XL) 150 MG PO TB24
150.0000 mg | ORAL_TABLET | Freq: Every day | ORAL | Status: DC
  Administered 2019-04-19 – 2019-04-22 (×4): 150 mg via ORAL
  Filled 2019-04-19 (×4): qty 1

## 2019-04-19 MED ORDER — AMLODIPINE BESYLATE 10 MG PO TABS
10.0000 mg | ORAL_TABLET | Freq: Every day | ORAL | Status: DC
  Administered 2019-04-19 – 2019-04-22 (×4): 10 mg via ORAL
  Filled 2019-04-19 (×4): qty 1

## 2019-04-19 MED ORDER — IOHEXOL 300 MG/ML  SOLN
75.0000 mL | Freq: Once | INTRAMUSCULAR | Status: AC | PRN
  Administered 2019-04-19: 75 mL via INTRAVENOUS

## 2019-04-19 MED ORDER — FOLIC ACID 1 MG PO TABS
1.0000 mg | ORAL_TABLET | Freq: Two times a day (BID) | ORAL | Status: DC
  Administered 2019-04-19 – 2019-04-22 (×6): 1 mg via ORAL
  Filled 2019-04-19 (×6): qty 1

## 2019-04-19 MED ORDER — PREDNISONE 20 MG PO TABS
30.0000 mg | ORAL_TABLET | Freq: Once | ORAL | Status: AC
  Administered 2019-04-19: 30 mg via ORAL
  Filled 2019-04-19: qty 2

## 2019-04-19 MED ORDER — PREDNISONE 5 MG PO TABS
50.0000 mg | ORAL_TABLET | Freq: Every day | ORAL | Status: DC
  Administered 2019-04-20 – 2019-04-22 (×3): 50 mg via ORAL
  Filled 2019-04-19 (×4): qty 2

## 2019-04-19 MED ORDER — SODIUM CHLORIDE 0.9 % IV SOLN
1.0000 g | INTRAVENOUS | Status: DC
  Administered 2019-04-20: 1 g via INTRAVENOUS
  Filled 2019-04-19: qty 10
  Filled 2019-04-19: qty 1

## 2019-04-19 MED ORDER — SODIUM CHLORIDE 0.9 % IV BOLUS
1000.0000 mL | Freq: Once | INTRAVENOUS | Status: AC
  Administered 2019-04-19: 1000 mL via INTRAVENOUS

## 2019-04-19 MED ORDER — SODIUM CHLORIDE (PF) 0.9 % IJ SOLN
INTRAMUSCULAR | Status: AC
  Administered 2019-04-19: 13:00:00
  Filled 2019-04-19: qty 50

## 2019-04-19 MED ORDER — ENOXAPARIN SODIUM 40 MG/0.4ML ~~LOC~~ SOLN
40.0000 mg | SUBCUTANEOUS | Status: DC
  Administered 2019-04-19 – 2019-04-21 (×3): 40 mg via SUBCUTANEOUS
  Filled 2019-04-19 (×3): qty 0.4

## 2019-04-19 MED ORDER — DIPHENHYDRAMINE HCL 25 MG PO CAPS
25.0000 mg | ORAL_CAPSULE | Freq: Four times a day (QID) | ORAL | Status: DC | PRN
  Administered 2019-04-19: 25 mg via ORAL
  Filled 2019-04-19: qty 1

## 2019-04-19 MED ORDER — CYCLOBENZAPRINE HCL 10 MG PO TABS
10.0000 mg | ORAL_TABLET | Freq: Two times a day (BID) | ORAL | Status: DC | PRN
  Administered 2019-04-19 – 2019-04-21 (×4): 10 mg via ORAL
  Filled 2019-04-19 (×4): qty 1

## 2019-04-19 MED ORDER — LIP MEDEX EX OINT
TOPICAL_OINTMENT | CUTANEOUS | Status: AC
  Administered 2019-04-19: 22:00:00
  Filled 2019-04-19: qty 7

## 2019-04-19 MED ORDER — DULOXETINE HCL 60 MG PO CPEP
60.0000 mg | ORAL_CAPSULE | Freq: Every day | ORAL | Status: DC
  Administered 2019-04-19 – 2019-04-22 (×4): 60 mg via ORAL
  Filled 2019-04-19 (×4): qty 1

## 2019-04-19 MED ORDER — SODIUM CHLORIDE 0.9 % IV SOLN
1.0000 g | Freq: Once | INTRAVENOUS | Status: AC
  Administered 2019-04-19: 1 g via INTRAVENOUS
  Filled 2019-04-19: qty 10

## 2019-04-19 MED ORDER — GABAPENTIN 300 MG PO CAPS
300.0000 mg | ORAL_CAPSULE | Freq: Three times a day (TID) | ORAL | Status: DC
  Administered 2019-04-19 – 2019-04-22 (×9): 300 mg via ORAL
  Filled 2019-04-19 (×8): qty 1

## 2019-04-19 MED ORDER — MUPIROCIN 2 % EX OINT
1.0000 "application " | TOPICAL_OINTMENT | Freq: Two times a day (BID) | CUTANEOUS | Status: DC
  Administered 2019-04-19 – 2019-04-22 (×6): 1 via NASAL
  Filled 2019-04-19 (×3): qty 22

## 2019-04-19 MED ORDER — LOPERAMIDE HCL 2 MG PO CAPS: 2.0000 mg | ORAL_CAPSULE | ORAL | Status: DC | PRN

## 2019-04-19 MED ORDER — NYSTATIN 100000 UNIT/ML MT SUSP: 5.0000 mL | Freq: Two times a day (BID) | OROMUCOSAL | Status: DC | PRN

## 2019-04-19 MED ORDER — FERROUS SULFATE 325 (65 FE) MG PO TABS
325.0000 mg | ORAL_TABLET | Freq: Two times a day (BID) | ORAL | Status: DC
  Administered 2019-04-20 – 2019-04-22 (×5): 325 mg via ORAL
  Filled 2019-04-19 (×5): qty 1

## 2019-04-19 MED ORDER — FAMOTIDINE IN NACL 20-0.9 MG/50ML-% IV SOLN
20.0000 mg | Freq: Once | INTRAVENOUS | Status: AC
  Administered 2019-04-19: 20 mg via INTRAVENOUS
  Filled 2019-04-19: qty 50

## 2019-04-19 MED ORDER — PANTOPRAZOLE SODIUM 20 MG PO TBEC
20.0000 mg | DELAYED_RELEASE_TABLET | Freq: Every day | ORAL | Status: DC
  Administered 2019-04-19 – 2019-04-21 (×3): 20 mg via ORAL
  Filled 2019-04-19 (×4): qty 1

## 2019-04-19 MED ORDER — HYDROCORTISONE NA SUCCINATE PF 100 MG IJ SOLR
100.0000 mg | Freq: Once | INTRAMUSCULAR | Status: AC
  Administered 2019-04-19: 10:00:00 100 mg via INTRAVENOUS
  Filled 2019-04-19: qty 2

## 2019-04-19 MED ORDER — METOPROLOL TARTRATE 12.5 MG HALF TABLET
12.5000 mg | ORAL_TABLET | Freq: Two times a day (BID) | ORAL | Status: DC
  Administered 2019-04-19 – 2019-04-22 (×6): 12.5 mg via ORAL
  Filled 2019-04-19 (×6): qty 1

## 2019-04-19 NOTE — ED Provider Notes (Signed)
[facility] DEPT Provider Note   CSN: [id number] Arrival date & time: 04/19/19  6720    History   Chief Complaint Chief Complaint  Patient presents with  . Facial Swelling  . Rash    HPI [name] [name] is a 23 y.o. female with history of gout, hypertension, lupus, ray nods disease presenting for evaluation of acute onset, progressively worsening rash for 1 week.  She reports the rash is pruritic, began in her upper extremities but has since spread to her chest and lower extremities bilaterally.  Denies any new soaps, shampoos, detergents, or lotions.  She is also had progressively worsening facial swelling over the last 3 days.  She reports it began periorbitally and since has spread to the rest of her face in the submental region.  Reports some purulent drainage coming from her eyelids bilaterally yesterday.  Denies difficulty swallowing or drooling.  She has a hoarse voice but reports that this is been present for the last 2 months and thinks it is due to dehydration.  Yesterday she noted a  "knot "to the top of her forehead.  She was hospitalized 1 month ago and discharged on 03/25/2019 with instructions to start taking bupropion, duloxetine, and amlodipine which she has been taking for the last several weeks without difficulty.  Reports she has been trying to drink a lot of water but feels chronically dehydrated.  For the last week or so she is also had significant dyspnea on exertion.  Denies chest pain.  No fever or cough.  No abdominal pain, nausea, or vomiting but she has had some urinary urgency, frequency, dysuria, and decreased urine output for 1 week.  Her sister who is at the bedside and is her caregiver has been giving her Benadryl without relief of her symptoms.     The history is provided by the patient and a relative.    Past Medical History:  Diagnosis Date  . Gout   . Hypertension   . Lupus ([place])   . Raynaud disease     Patient Active  Problem List   Diagnosis Date Noted  . Dysuria 04/19/2019  . 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 Lupus (systemic lupus erythematosus) ([place]) 03/19/2019  . Bronchitis 03/19/2019  . Essential hypertension 03/19/2019  . Elevated AST (SGOT) 03/19/2019  . Dehydration 03/19/2019  . Sinus tachycardia 03/19/2019  . Metabolic acidosis 94/70/9628  . Oral thrush 03/19/2019  . Relative acute adrenal insufficiency ([place]) 06/11/2018  . Anemia 06/09/2018  . Lobar pneumonia ([place]) 06/07/2018  . Pleuritic chest pain 06/05/2018  . Dysphagia 06/01/2018  . Odynophagia 06/01/2018  . Thrush 06/01/2018  . Mucositis oral 01/12/2018  . Vaginal candidiasis 01/12/2018  . Methotrexate toxicity   . Mouth ulcers   . Leukopenia 01/10/2018  . Accidental methotrexate overdose 01/08/2018  . Depression 02/08/2017  . Primary osteoarthritis of both knees 11/09/2016  . Chronic midline low back pain without sciatica 11/09/2016  . Vitamin D deficiency 11/09/2016  . SLE (systemic lupus erythematosus) ([place]) 09/19/2016  . Insomnia 01/29/2016  . Depo-Provera contraceptive status 08/11/2015  . Raynaud's disease 08/11/2015  . Seasonal allergies 08/11/2015  . IDA (iron deficiency anemia) 08/11/2015    Past Surgical History:  Procedure Laterality Date  . ORIF SHOULDER FRACTURE Left   . TONSILLECTOMY       OB History   No obstetric history on file.      Home Medications    Prior to Admission  medications   Medication Sig Start Date End Date Taking? Authorizing Provider  amLODipine (NORVASC) 10 MG tablet Take 1 tablet (10 mg total) by mouth daily. 03/26/19  Yes Hall, Carole N, DO  buPROPion (WELLBUTRIN XL) 150 MG 24 hr tablet Take 1 tablet (150 mg total) by mouth daily. 03/26/19  Yes Kayleen Memos, DO  cyclobenzaprine (FLEXERIL) 10 MG tablet Take 1 tablet (10 mg total) by mouth 2 (two) times daily as needed for muscle spasms. 03/25/19  Yes Kayleen Memos, DO   diphenhydrAMINE (BENADRYL) 25 MG tablet Take 25 mg by mouth every 6 (six) hours as needed for allergies.   Yes [provider]  DULoxetine (CYMBALTA) 60 MG capsule Take 1 capsule (60 mg total) by mouth daily. 03/26/19  Yes Irene Pap N, DO  ferrous sulfate 325 (65 FE) MG tablet Take 1 tablet (325 mg total) by mouth 2 (two) times daily. With 1/2 glass of orange juice 12/15/15  Yes Davonna Belling, MD  folic acid (FOLVITE) 1 MG tablet Take 1 tablet (1 mg total) by mouth at bedtime. Patient taking differently: Take 1 mg by mouth 2 (two) times daily.  03/25/19  Yes Irene Pap N, DO  gabapentin (NEURONTIN) 300 MG capsule Take 1 capsule (300 mg total) by mouth 3 (three) times daily. 04/10/19  Yes [name], FNP  HYDROcodone-acetaminophen (NORCO/VICODIN) 5-325 MG tablet Take 1 tablet by mouth every 6 (six) hours as needed for up to 15 days. 04/10/19 04/25/19 Yes [name], FNP  loperamide (IMODIUM) 2 MG capsule Take 1 capsule (2 mg total) by mouth as needed for diarrhea or loose stools. 03/25/19  Yes Kayleen Memos, DO  medroxyPROGESTERone (DEPO-PROVERA) 150 MG/ML injection Inject 150 mg into the muscle every 3 (three) months.   Yes [provider]  metoprolol tartrate (LOPRESSOR) 25 MG tablet Take 0.5 tablets (12.5 mg total) by mouth 2 (two) times daily. 03/25/19  Yes Kayleen Memos, DO  mupirocin ointment (BACTROBAN) 2 % Place 1 application into the nose 2 (two) times daily. 04/10/19  Yes [name], FNP  nystatin (MYCOSTATIN) 100000 UNIT/ML suspension Take 5 mLs (500,000 Units total) by mouth 2 (two) times daily as needed. Patient taking differently: Take 5 mLs by mouth 2 (two) times daily as needed (thrush).  03/25/19  Yes Hall, Carole N, DO  pantoprazole (PROTONIX) 20 MG tablet Take 1 tablet (20 mg total) by mouth at bedtime. 03/25/19  Yes Kayleen Memos, DO  predniSONE (DELTASONE) 5 MG tablet Take 5 mg by mouth 2 (two) times daily.   Yes [provider]  Vitamin D,  Ergocalciferol, (DRISDOL) 50000 units CAPS capsule Take 1 capsule (50,000 Units total) by mouth 2 (two) times a week. 12/21/17  Yes Deveshwar, Abel Presto, MD  folic acid (FOLVITE) 1 MG tablet TAKE 2 TABLETS (2 MG TOTAL) BY MOUTH DAILY. Patient not taking: Reported on 04/19/2019 12/11/17   Bo Merino, MD  tacrolimus (PROGRAF) 1 MG capsule Take 1 capsule (1 mg total) by mouth 2 (two) times daily. Patient not taking: Reported on 04/19/2019 03/25/19   Kayleen Memos, DO    Family History Family History  Problem Relation Age of Onset  . Aneurysm Mother   . Stroke Sister   . Seizures Sister   . Lung cancer Maternal Aunt   . Heart attack Paternal Uncle   . Colon cancer Neg Hx   . Esophageal cancer Neg Hx   . Pancreatic cancer Neg Hx   . Stomach cancer Neg Hx  Social History Social History   Tobacco Use  . Smoking status: Never Smoker  . Smokeless tobacco: Never Used  Substance Use Topics  . Alcohol use: No  . Drug use: No     Allergies   Hydroxychloroquine and Latex   Review of Systems Review of Systems  Constitutional: Negative for chills and fever.  HENT: Positive for facial swelling and voice change (x2 months). Negative for trouble swallowing.   Respiratory: Positive for shortness of breath. Negative for cough.   Cardiovascular: Negative for chest pain.  Genitourinary: Positive for decreased urine volume, dysuria, frequency and urgency. Negative for hematuria.  Skin: Positive for rash.  All other systems reviewed and are negative.    Physical Exam Updated Vital Signs BP 114/85   Pulse (!) 110   Temp 98.9 F (37.2 C) (Oral)   Resp 16   SpO2 (!) 72%   Physical Exam Vitals signs and nursing note reviewed.  Constitutional:      General: She is not in acute distress.    Appearance: She is well-developed.  HENT:     Head:     Comments: Patient with bilateral periorbital edema, worse in the lower eyelids, swelling to the cheeks, and a 2x3 cm area of swelling to  the middle of the forehead.  No erythema or tenderness.  She has submental swelling which is mildly tender to palpation.  She exhibits some trismus but tells me it is due to "soreness "in her cheeks.  Tolerating secretions without difficulty.  Speaks with a hoarse voice but has no upper airway stridor and is tolerating secretions without difficulty.    Mouth/Throat:     Mouth: Mucous membranes are dry.     Pharynx: Uvula midline.     Comments: No swelling of the lips or tongue. Eyes:     General:        Right eye: No discharge.        Left eye: No discharge.     Extraocular Movements: Extraocular movements intact.     Conjunctiva/sclera: Conjunctivae normal.     Pupils: Pupils are equal, round, and reactive to light.  Neck:     Musculoskeletal: Normal range of motion and neck supple. No neck rigidity.     Vascular: No JVD.     Trachea: No tracheal deviation.  Cardiovascular:     Rate and Rhythm: Tachycardia present.  Pulmonary:     Effort: Pulmonary effort is normal.     Breath sounds: Normal breath sounds.  Abdominal:     General: Abdomen is flat. Bowel sounds are normal. There is no distension.     Palpations: Abdomen is soft.     Tenderness: There is no abdominal tenderness. There is no guarding or rebound.  Skin:    General: Skin is warm and dry.     Findings: Rash present.     Comments: See below image.  Patient has rash to the bilateral upper extremities, bilateral lower extremities, chest, and face.  Nikolsky sign absent.  She has some excoriations but no vesicles or bulla.  Neurological:     Mental Status: She is alert.  Psychiatric:        Behavior: Behavior normal.          ED Treatments / Results  Labs (all labs ordered are listed, but only abnormal results are displayed) Labs Reviewed  COMPREHENSIVE METABOLIC PANEL - Abnormal; Notable for the following components:      Result Value   Calcium 8.3 (*)  Albumin 2.4 (*)    AST 49 (*)    Total Bilirubin 0.2  (*)    All other components within normal limits  CBC WITH DIFFERENTIAL/PLATELET - Abnormal; Notable for the following components:   WBC 2.7 (*)    RBC 3.30 (*)    Hemoglobin 9.3 (*)    HCT 30.4 (*)    Lymphs Abs 0.6 (*)    All other components within normal limits  URINALYSIS, ROUTINE W REFLEX MICROSCOPIC - Abnormal; Notable for the following components:   APPearance HAZY (*)    Protein, ur >=300 (*)    Leukocytes,Ua SMALL (*)    Bacteria, UA RARE (*)    All other components within normal limits  URINE CULTURE  SARS CORONAVIRUS 2 (HOSPITAL ORDER, [place] [facility])  CULTURE, BLOOD (ROUTINE X 2)  CULTURE, BLOOD (ROUTINE X 2)  I-STAT BETA HCG BLOOD, ED ([facility], [facility], [facility] ONLY)    EKG EKG Interpretation  Date/Time:  Friday April 19 2019 09:53:53 EDT Ventricular Rate:  115 PR Interval:    QRS Duration: 77 QT Interval:  354 QTC Calculation: 490 R Axis:   33 Text Interpretation:  Sinus tachycardia Borderline prolonged QT interval Confirmed by Virgel Manifold [id number]) on 04/19/2019 2:13:13 PM   Radiology Ct Soft Tissue Neck W Contrast  Result Date: 04/19/2019 CLINICAL DATA:  Facial swelling over the last 3 days. Rash. Anterior and submandibular pain. EXAM: CT NECK WITH CONTRAST TECHNIQUE: Multidetector CT imaging of the neck was performed using the standard protocol following the bolus administration of intravenous contrast. CONTRAST:  62mL OMNIPAQUE IOHEXOL 300 MG/ML  SOLN COMPARISON:  None. FINDINGS: Pharynx and larynx: No mucosal or submucosal lesion is seen. No evidence of airway compromise. Salivary glands: Parotid and submandibular glands are normal. Submandibular glands are diminutive but do not show inflammation or a focal lesion. Thyroid: Normal Lymph nodes: No enlarged or low-density nodes on either side of the neck. Normal bilateral nodes. Vascular: Normal Limited intracranial: Normal Visualized orbits: Normal Mastoids and visualized paranasal sinuses: Clear  Skeleton: Normal. No evidence of advanced dental or periodontal disease. Upper chest: Normal Other: Nonspecific edema pattern within the subcutaneous fat of the lower face and upper neck. No evidence of abscess. IMPRESSION: Nonspecific edema pattern within the subcutaneous fat of the lower face and upper neck. This could be due to cellulitis or nonspecific edema. No sign of abscess or lymphadenopathy. No sign of airway compromise. Electronically Signed   By: Nelson Chimes M.D.   On: 04/19/2019 12:34   Dg Chest Portable 1 View  Result Date: 04/19/2019 CLINICAL DATA:  Shortness of breath EXAM: PORTABLE CHEST 1 VIEW COMPARISON:  March 25, 2019 FINDINGS: The lungs are clear. Heart size and pulmonary vascularity are normal. No adenopathy. No pneumothorax. No bone lesions. IMPRESSION: No edema or consolidation. Electronically Signed   By: Lowella Grip III M.D.   On: 04/19/2019 10:16    Procedures Procedures (including critical care time)  Medications Ordered in ED Medications  sodium chloride 0.9 % bolus 1,000 mL (0 mLs Intravenous Stopped 04/19/19 1411)  famotidine (PEPCID) IVPB 20 mg premix (0 mg Intravenous Stopped 04/19/19 1214)  hydrocortisone sodium succinate (SOLU-CORTEF) 100 MG injection 100 mg (100 mg Intravenous Given 04/19/19 1027)  sodium chloride 0.9 % bolus 1,000 mL (1,000 mLs Intravenous New Bag/Given 04/19/19 1415)  iohexol (OMNIPAQUE) 300 MG/ML solution 75 mL (75 mLs Intravenous Contrast Given 04/19/19 1206)  sodium chloride (PF) 0.9 % injection (  Given by Other 04/19/19  1243)  cefTRIAXone (ROCEPHIN) 1 g in sodium chloride 0.9 % 100 mL IVPB (0 g Intravenous Stopped 04/19/19 1506)  predniSONE (DELTASONE) tablet 30 mg (30 mg Oral Given 04/19/19 1506)     Initial Impression / Assessment and Plan / ED Course  I have reviewed the triage vital signs and the nursing notes.  Pertinent labs & imaging results that were available during my care of the patient were reviewed by me and considered in my  medical decision making (see chart for details).        Patient with history of lupus presenting for evaluation of progressively worsening urinary symptoms, rash, facial swelling.  She is afebrile, persistently tachycardic in the ED up to 130s on initial assessment with some improvement after 2 L IV fluid bolus.  She is uncomfortable but nontoxic in appearance, tolerating secretions without difficulty.  Exhibits a hoarse voice but reports this is been present for the last 2 months and there is no evidence of airway compromise on my examination.  She has no evidence of angioedema.  She exhibits a pruritic rash to the extremities, chest, and face which she reports is worsening.  Has not been taking her CellCept but continues to take her tacrolimus.  Lab work reviewed by me shows a new leukopenia and 2 g drop in her hemoglobin compared to blood work from 3 weeks ago.  No renal insufficiency or metabolic derangements.  UA shows worsening proteinuria; also shows pyuria, WBCs and RBCs and some bacteria suggestive of UTI.  Will culture and give Rocephin IV.  Chest x-ray shows no acute cardiopulmonary abnormalities, no evidence of edema or consolidation.  CT soft tissue neck shows generalized edema with out drainable abscess.  Consider cellulitis versus just generalized inflammation.  No evidence of airway compromise on imaging.  2:16PM CONSULT: Spoke with Dr. Maryjean Ka Diab with patient's rheumatology group at [facility].  He has reviewed the patient's work-up and discussed with the patient's primary rheumatologist Dr. [name].  Given worsening rash and with facial edema he recommends admission to the hospital with IV antibiotics for UTI, holding the patient's immunosuppressive agents, and initiating 50 mg prednisone daily.  It would like daily updates on the patient's progress.  I spoke with Dr. Erlinda Hong with [facility] hospitalist service who agrees to assume care of patient and bring her into the hospital for further  evaluation and management.  Final Clinical Impressions(s) / ED Diagnoses   Final diagnoses:  Acute cystitis with hematuria  Facial edema  Rash due to systemic lupus erythematosus (SLE) [facility])    ED Discharge Orders    None       Debroah Baller 04/19/19 1651    Virgel Manifold, MD 04/20/19 [phone]

## 2019-04-19 NOTE — ED Notes (Signed)
ED TO INPATIENT HANDOFF REPORT  Name/Age/Gender [name] [name] 23 y.o. female  Code Status Code Status History    Date Active Date Inactive Code Status Order ID Comments User Context   03/19/2019 1515 03/25/2019 2108 Full Code [id number]  Kerney Elbe, [place] Inpatient   06/10/2018 0100 06/11/2018 1638 Full Code [id number]  Reubin Milan, MD Inpatient   06/05/2018 1528 06/09/2018 1605 Full Code [id number]  Karmen Bongo, MD ED   01/08/2018 2308 01/13/2018 2009 Full Code [id number]  Etta Quill, DO ED   Advance Care Planning Activity      Home/SNF/Other Home  Chief Complaint facial swelling / rash   Level of Care/Admitting Diagnosis ED Disposition    ED Disposition Condition [facility] Area: [facility] [[id number]]  Level of Care: Med-Surg [16]  Covid Evaluation: Asymptomatic Screening Protocol (No Symptoms)  Diagnosis: Dysuria [788.1.ICD-9-CM]  Admitting Physician: Florencia Reasons [[id number]]  Attending Physician: Florencia Reasons [[id number]]  PT Class (Do Not Modify): Observation [104]  PT Acc Code (Do Not Modify): Observation [10022]       Medical History Past Medical History:  Diagnosis Date  . Gout   . Hypertension   . Lupus ([place])   . Raynaud disease     Allergies Allergies  Allergen Reactions  . Hydroxychloroquine Rash  . Latex Rash    IV Location/Drains/Wounds Patient Lines/Drains/Airways Status   Active Line/Drains/Airways    Name:   Placement date:   Placement time:   Site:   Days:   Peripheral IV 04/19/19 Right;Anterior Forearm   04/19/19    1629    Forearm   less than 1   Wound / Incision (Open or Dehisced) 01/09/18 Buttocks Left;Right Raw areas noted between pt buttocks, painful/red   01/09/18    0100    Buttocks   465          Labs/Imaging Results for orders placed or performed during the hospital encounter of 04/19/19 (from the past 48 hour(s))  Comprehensive metabolic panel     Status: Abnormal   Collection Time: 04/19/19  10:29 AM  Result Value Ref Range   Sodium 137 135 - 145 mmol/L   Potassium 3.8 3.5 - 5.1 mmol/L   Chloride 107 98 - 111 mmol/L   CO2 22 22 - 32 mmol/L   Glucose, Bld 88 70 - 99 mg/dL   BUN 12 6 - 20 mg/dL   Creatinine, Ser 0.60 0.44 - 1.00 mg/dL   Calcium 8.3 (L) 8.9 - 10.3 mg/dL   Total Protein 7.0 6.5 - 8.1 g/dL   Albumin 2.4 (L) 3.5 - 5.0 g/dL   AST 49 (H) 15 - 41 U/L   ALT 18 0 - 44 U/L   Alkaline Phosphatase 100 38 - 126 U/L   Total Bilirubin 0.2 (L) 0.3 - 1.2 mg/dL   GFR calc non Af Amer >60 >60 mL/min   GFR calc Af Amer >60 >60 mL/min   Anion gap 8 5 - 15    Comment: Performed at [facility], [place] [address] [place], [place] [address]  CBC with Differential     Status: Abnormal   Collection Time: 04/19/19 10:29 AM  Result Value Ref Range   WBC 2.7 (L) 4.0 - 10.5 K/uL   RBC 3.30 (L) 3.87 - 5.11 MIL/uL   Hemoglobin 9.3 (L) 12.0 - 15.0 g/dL   HCT 30.4 (L) 36.0 - 46.0 %   MCV 92.1 80.0 - 100.0 fL  MCH 28.2 26.0 - 34.0 pg   MCHC 30.6 30.0 - 36.0 g/dL   RDW 15.0 11.5 - 15.5 %   Platelets 161 150 - 400 K/uL   nRBC 0.0 0.0 - 0.2 %   Neutrophils Relative % 73 %   Neutro Abs 2.0 1.7 - 7.7 K/uL   Lymphocytes Relative 20 %   Lymphs Abs 0.6 (L) 0.7 - 4.0 K/uL   Monocytes Relative 5 %   Monocytes Absolute 0.1 0.1 - 1.0 K/uL   Eosinophils Relative 0 %   Eosinophils Absolute 0.0 0.0 - 0.5 K/uL   Basophils Relative 0 %   Basophils Absolute 0.0 0.0 - 0.1 K/uL   Immature Granulocytes 2 %   Abs Immature Granulocytes 0.05 0.00 - 0.07 K/uL    Comment: Performed at [facility], [place] [address], [name], [place] [address]  I-Stat beta hCG blood, ED     Status: None   Collection Time: 04/19/19 10:34 AM  Result Value Ref Range   I-stat hCG, quantitative <5.0 <5 mIU/mL   Comment 3            Comment:   GEST. AGE      CONC.  (mIU/mL)   <=1 WEEK        5 - 50     2 WEEKS       50 - 500     3 WEEKS       100 - 10,000     4 WEEKS     1,000 -  30,000        FEMALE AND NON-PREGNANT FEMALE:     LESS THAN 5 mIU/mL   Urinalysis, Routine w reflex microscopic     Status: Abnormal   Collection Time: 04/19/19 11:51 AM  Result Value Ref Range   Color, Urine YELLOW YELLOW   APPearance HAZY (A) CLEAR   Specific Gravity, Urine 1.023 1.005 - 1.030   pH 6.0 5.0 - 8.0   Glucose, UA NEGATIVE NEGATIVE mg/dL   Hgb urine dipstick NEGATIVE NEGATIVE   Bilirubin Urine NEGATIVE NEGATIVE   Ketones, ur NEGATIVE NEGATIVE mg/dL   Protein, ur >=300 (A) NEGATIVE mg/dL   Nitrite NEGATIVE NEGATIVE   Leukocytes,Ua SMALL (A) NEGATIVE   RBC / HPF 11-20 0 - 5 RBC/hpf   WBC, UA 21-50 0 - 5 WBC/hpf   Bacteria, UA RARE (A) NONE SEEN   Squamous Epithelial / LPF 0-5 0 - 5   Mucus PRESENT    Hyaline Casts, UA PRESENT     Comment: Performed at [facility], [place] [address] [place], [place] [address]   Ct Soft Tissue Neck W Contrast  Result Date: 04/19/2019 CLINICAL DATA:  Facial swelling over the last 3 days. Rash. Anterior and submandibular pain. EXAM: CT NECK WITH CONTRAST TECHNIQUE: Multidetector CT imaging of the neck was performed using the standard protocol following the bolus administration of intravenous contrast. CONTRAST:  29mL OMNIPAQUE IOHEXOL 300 MG/ML  SOLN COMPARISON:  None. FINDINGS: Pharynx and larynx: No mucosal or submucosal lesion is seen. No evidence of airway compromise. Salivary glands: Parotid and submandibular glands are normal. Submandibular glands are diminutive but do not show inflammation or a focal lesion. Thyroid: Normal Lymph nodes: No enlarged or low-density nodes on either side of the neck. Normal bilateral nodes. Vascular: Normal Limited intracranial: Normal Visualized orbits: Normal Mastoids and visualized paranasal sinuses: Clear Skeleton: Normal. No evidence of advanced dental or periodontal disease. Upper chest: Normal Other: Nonspecific edema pattern  within the subcutaneous fat of the lower face and upper neck.  No evidence of abscess. IMPRESSION: Nonspecific edema pattern within the subcutaneous fat of the lower face and upper neck. This could be due to cellulitis or nonspecific edema. No sign of abscess or lymphadenopathy. No sign of airway compromise. Electronically Signed   By: Nelson Chimes M.D.   On: 04/19/2019 12:34   Dg Chest Portable 1 View  Result Date: 04/19/2019 CLINICAL DATA:  Shortness of breath EXAM: PORTABLE CHEST 1 VIEW COMPARISON:  March 25, 2019 FINDINGS: The lungs are clear. Heart size and pulmonary vascularity are normal. No adenopathy. No pneumothorax. No bone lesions. IMPRESSION: No edema or consolidation. Electronically Signed   By: Lowella Grip III M.D.   On: 04/19/2019 10:16    Pending Labs Unresulted Labs (From admission, onward)    Start     Ordered   04/19/19 1459  Blood culture (routine x 2)  BLOOD CULTURE X 2,   STAT     04/19/19 1459   04/19/19 1431  SARS Coronavirus 2 [facility] order, Performed in [facility] hospital lab) Nasopharyngeal Nasopharyngeal Swab  (Symptomatic/High Risk of Exposure/Tier 1 Patients Labs with Precautions)  Once,   STAT    Question Answer Comment  Is this test for diagnosis or screening Diagnosis of ill patient   Symptomatic for COVID-19 as defined by CDC No   Hospitalized for COVID-19 No   Admitted to ICU for COVID-19 No   Previously tested for COVID-19 Yes   Resident in a congregate (group) care setting No   Employed in healthcare setting No   Pregnant No      04/19/19 1430   04/19/19 0934  Urine culture  ONCE - STAT,   STAT     04/19/19 0937          Vitals/Pain Today's Vitals   04/19/19 1130 04/19/19 1230 04/19/19 1330 04/19/19 1500  BP: 108/70 122/87 119/82 114/85  Pulse: (!) 113 (!) 105 (!) 110   Resp: 18 17 (!) 25 16  Temp:      TempSrc:      SpO2: 100% 96% (!) 72%   PainSc:        Isolation Precautions No active isolations  Medications Medications  sodium chloride 0.9 % bolus 1,000 mL (0 mLs Intravenous  Stopped 04/19/19 1411)  famotidine (PEPCID) IVPB 20 mg premix (0 mg Intravenous Stopped 04/19/19 1214)  hydrocortisone sodium succinate (SOLU-CORTEF) 100 MG injection 100 mg (100 mg Intravenous Given 04/19/19 1027)  sodium chloride 0.9 % bolus 1,000 mL (0 mLs Intravenous Stopped 04/19/19 1632)  iohexol (OMNIPAQUE) 300 MG/ML solution 75 mL (75 mLs Intravenous Contrast Given 04/19/19 1206)  sodium chloride (PF) 0.9 % injection (  Given by Other 04/19/19 1243)  cefTRIAXone (ROCEPHIN) 1 g in sodium chloride 0.9 % 100 mL IVPB (0 g Intravenous Stopped 04/19/19 1506)  predniSONE (DELTASONE) tablet 30 mg (30 mg Oral Given 04/19/19 1506)    Mobility walks

## 2019-04-19 NOTE — ED Notes (Signed)
Pt ambulatory to bathroom with standby assistance 

## 2019-04-19 NOTE — ED Notes (Signed)
Patient transported to CT 

## 2019-04-19 NOTE — ED Triage Notes (Addendum)
Patient reports facial swelling X3 days and Itchy rash on right arm X5 days.   C/o of knot on head.  C/O Not being able to urinate like normal X1 week.   C/O Jaw pain X3 days ago.   Patient reports tripping and left ankle swelling X1 week ago.    Patient reports feeling shob with exertion.   Hx. Lupus.   Denies N/V or diarrhea.   100% on RA  P-112   A/ox4 Wheelchair in triage Uses walker at home.   Patient reports trying benadryl last night.   Sister here with patient.

## 2019-04-19 NOTE — H&P (Addendum)
History and Physical  [name] [name] HEN:[id number] DOB: 08-28-1996 DOA: 04/19/2019  Referring physician: EDP PCP: [name], FNP   Chief Complaint: dysuria, rash, facial swelling   HPI: [name] [name] is a 23 y.o. female   H/o lupus with lupus nephritis on cellcept/prograft, h/oRaynaud's phenomenon on norvasc, h/o HTN on low dose lopressor presents to ED c/o dysuria for a month, diffuse itchy rash for a week, facial swelling for three days, she denies fever. She reports stop taking cellcept a week ago , she think cellcept 'mess up"with teeth and gum.  ED course: + sinus tachycardia, otherwise stable , no fever, no hypoxia.  CT soft tissue neck: "Nonspecific edema pattern within the subcutaneous fat of the lower face and upper neck. This could be due to cellulitis or nonspecific edema. No sign of abscess or lymphadenopathy. No sign of airway compromise." Cxr unremarkable EKG sinus tachycardia, qTc 490 Labs: wbc 2.7 , hgb 9.3 which is lower than her baseline, cr 0.6, mild elevated ast which appear has been chronically elevated since 05/2018.  ua with rare bacteria, small leuk, progressive proteinuria,   EDP discussed case with patient 's primary Rhuematologist who recommends hospitalization at [facility], start oral prednisone 50mg  daily, hold immunosuppressive , continue iv abx for possible uti   SARS Cov2 screening negative     Review of Systems:  Detail per HPI, Review of systems are otherwise negative  Past Medical History:  Diagnosis Date   Gout    Hypertension    Lupus ([place])    Raynaud disease    Past Surgical History:  Procedure Laterality Date   ORIF SHOULDER FRACTURE Left    TONSILLECTOMY     Social History:  reports that she has never smoked. She has never used smokeless tobacco. She reports that she does not drink alcohol or use drugs. Patient lives at home & walks with a cane/ walker   Allergies  Allergen Reactions   Hydroxychloroquine Rash   Latex  Rash    Family History  Problem Relation Age of Onset   Aneurysm Mother    Stroke Sister    Seizures Sister    Lung cancer Maternal Aunt    Heart attack Paternal Uncle    Colon cancer Neg Hx    Esophageal cancer Neg Hx    Pancreatic cancer Neg Hx    Stomach cancer Neg Hx       Prior to Admission medications   Medication Sig Start Date End Date Taking? Authorizing Provider  amLODipine (NORVASC) 10 MG tablet Take 1 tablet (10 mg total) by mouth daily. 03/26/19  Yes Hall, Carole N, DO  buPROPion (WELLBUTRIN XL) 150 MG 24 hr tablet Take 1 tablet (150 mg total) by mouth daily. 03/26/19  Yes Kayleen Memos, DO  cyclobenzaprine (FLEXERIL) 10 MG tablet Take 1 tablet (10 mg total) by mouth 2 (two) times daily as needed for muscle spasms. 03/25/19  Yes Kayleen Memos, DO  diphenhydrAMINE (BENADRYL) 25 MG tablet Take 25 mg by mouth every 6 (six) hours as needed for allergies.   Yes [provider]  DULoxetine (CYMBALTA) 60 MG capsule Take 1 capsule (60 mg total) by mouth daily. 03/26/19  Yes Irene Pap N, DO  ferrous sulfate 325 (65 FE) MG tablet Take 1 tablet (325 mg total) by mouth 2 (two) times daily. With 1/2 glass of orange juice 12/15/15  Yes Davonna Belling, MD  folic acid (FOLVITE) 1 MG tablet Take 1 tablet (1 mg total) by  mouth at bedtime. Patient taking differently: Take 1 mg by mouth 2 (two) times daily.  03/25/19  Yes Irene Pap N, DO  gabapentin (NEURONTIN) 300 MG capsule Take 1 capsule (300 mg total) by mouth 3 (three) times daily. 04/10/19  Yes [name], FNP  HYDROcodone-acetaminophen (NORCO/VICODIN) 5-325 MG tablet Take 1 tablet by mouth every 6 (six) hours as needed for up to 15 days. 04/10/19 04/25/19 Yes [name], FNP  loperamide (IMODIUM) 2 MG capsule Take 1 capsule (2 mg total) by mouth as needed for diarrhea or loose stools. 03/25/19  Yes Kayleen Memos, DO  medroxyPROGESTERone (DEPO-PROVERA) 150 MG/ML injection Inject 150 mg into the muscle every 3  (three) months.   Yes [provider]  metoprolol tartrate (LOPRESSOR) 25 MG tablet Take 0.5 tablets (12.5 mg total) by mouth 2 (two) times daily. 03/25/19  Yes Kayleen Memos, DO  mupirocin ointment (BACTROBAN) 2 % Place 1 application into the nose 2 (two) times daily. 04/10/19  Yes [name], FNP  nystatin (MYCOSTATIN) 100000 UNIT/ML suspension Take 5 mLs (500,000 Units total) by mouth 2 (two) times daily as needed. Patient taking differently: Take 5 mLs by mouth 2 (two) times daily as needed (thrush).  03/25/19  Yes Hall, Carole N, DO  pantoprazole (PROTONIX) 20 MG tablet Take 1 tablet (20 mg total) by mouth at bedtime. 03/25/19  Yes Kayleen Memos, DO  predniSONE (DELTASONE) 5 MG tablet Take 5 mg by mouth 2 (two) times daily.   Yes [provider]  Vitamin D, Ergocalciferol, (DRISDOL) 50000 units CAPS capsule Take 1 capsule (50,000 Units total) by mouth 2 (two) times a week. 12/21/17  Yes Deveshwar, Abel Presto, MD  folic acid (FOLVITE) 1 MG tablet TAKE 2 TABLETS (2 MG TOTAL) BY MOUTH DAILY. Patient not taking: Reported on 04/19/2019 12/11/17   Bo Merino, MD  tacrolimus (PROGRAF) 1 MG capsule Take 1 capsule (1 mg total) by mouth 2 (two) times daily. Patient not taking: Reported on 04/19/2019 03/25/19   Kayleen Memos, DO    Physical Exam: BP 122/87    Pulse (!) 105    Temp 98.9 F (37.2 C) (Oral)    Resp 17    SpO2 96%    General:  NAD, aaox3  Eyes: PERRL  ENT: generalized facial edema  Neck: supple, no JVD  Cardiovascular: sinus tachycardia  Respiratory: CTABL  Abdomen: soft/NT/ND, positive bowel sounds  Skin: diffuse pathy erythematous rash  Musculoskeletal:  No edema  Psychiatric: calm/cooperative  Neurologic: no focal findings            Labs on Admission:  Basic Metabolic Panel: Recent Labs  Lab 04/19/19 1029  NA 137  K 3.8  CL 107  CO2 22  GLUCOSE 88  BUN 12  CREATININE 0.60  CALCIUM 8.3*   Liver Function Tests: Recent Labs  Lab  04/19/19 1029  AST 49*  ALT 18  ALKPHOS 100  BILITOT 0.2*  PROT 7.0  ALBUMIN 2.4*   No results for input(s): LIPASE, AMYLASE in the last 168 hours. No results for input(s): AMMONIA in the last 168 hours. CBC: Recent Labs  Lab 04/19/19 1029  WBC 2.7*  NEUTROABS 2.0  HGB 9.3*  HCT 30.4*  MCV 92.1  PLT 161   Cardiac Enzymes: No results for input(s): CKTOTAL, CKMB, CKMBINDEX, TROPONINI in the last 168 hours.  BNP (last 3 results) No results for input(s): BNP in the last 8760 hours.  ProBNP (last 3 results) No results for input(s): PROBNP  in the last 8760 hours.  CBG: No results for input(s): GLUCAP in the last 168 hours.  Radiological Exams on Admission: Ct Soft Tissue Neck W Contrast  Result Date: 04/19/2019 CLINICAL DATA:  Facial swelling over the last 3 days. Rash. Anterior and submandibular pain. EXAM: CT NECK WITH CONTRAST TECHNIQUE: Multidetector CT imaging of the neck was performed using the standard protocol following the bolus administration of intravenous contrast. CONTRAST:  16mL OMNIPAQUE IOHEXOL 300 MG/ML  SOLN COMPARISON:  None. FINDINGS: Pharynx and larynx: No mucosal or submucosal lesion is seen. No evidence of airway compromise. Salivary glands: Parotid and submandibular glands are normal. Submandibular glands are diminutive but do not show inflammation or a focal lesion. Thyroid: Normal Lymph nodes: No enlarged or low-density nodes on either side of the neck. Normal bilateral nodes. Vascular: Normal Limited intracranial: Normal Visualized orbits: Normal Mastoids and visualized paranasal sinuses: Clear Skeleton: Normal. No evidence of advanced dental or periodontal disease. Upper chest: Normal Other: Nonspecific edema pattern within the subcutaneous fat of the lower face and upper neck. No evidence of abscess. IMPRESSION: Nonspecific edema pattern within the subcutaneous fat of the lower face and upper neck. This could be due to cellulitis or nonspecific edema. No  sign of abscess or lymphadenopathy. No sign of airway compromise. Electronically Signed   By: Nelson Chimes M.D.   On: 04/19/2019 12:34   Dg Chest Portable 1 View  Result Date: 04/19/2019 CLINICAL DATA:  Shortness of breath EXAM: PORTABLE CHEST 1 VIEW COMPARISON:  March 25, 2019 FINDINGS: The lungs are clear. Heart size and pulmonary vascularity are normal. No adenopathy. No pneumothorax. No bone lesions. IMPRESSION: No edema or consolidation. Electronically Signed   By: Lowella Grip III M.D.   On: 04/19/2019 10:16     Assessment/Plan Present on Admission: **None**   Rash, facial edema, no oral mucosa involvement, no airway compromise, no dysphagia,  -CT soft tissue with unspecific edema, no drainable fluids collection -prednisone daily, benadryl prn  UTI? In a severely immunosuppressed individual -She has no fever, does has leukopenia, she does reports urinary symptoms -Will follow urine and blood culture, continue rocephin for now  Cytopenia:  Per chart review, patient has been having neutropenia and  Anemia chronically  Mild elevated AST: appear chronic  Lupus/lupus nephritis: Holding immunosuppressant per primary rheumatology recommendation On daily prednisone 50mg  daily per rheumatology recommendation  Rheumatology requests daily update  DVT prophylaxis: lovenox  Consultants: EDP discussed case with Rheumatology  at [place]   Code Status: full   Family Communication:  Patient   Disposition Plan: med surg obs  Time spent: 56mins  Florencia Reasons MD, PhD, FACP [facility] Pager [phone] If 7PM-7AM, please contact night-coverage at [email or web address], password [facility]

## 2019-04-20 LAB — CBC
HCT: 27.3 % — ABNORMAL LOW (ref 36.0–46.0)
Hemoglobin: 8.4 g/dL — ABNORMAL LOW (ref 12.0–15.0)
MCH: 28.1 pg (ref 26.0–34.0)
MCHC: 30.8 g/dL (ref 30.0–36.0)
MCV: 91.3 fL (ref 80.0–100.0)
Platelets: 165 10*3/uL (ref 150–400)
RBC: 2.99 MIL/uL — ABNORMAL LOW (ref 3.87–5.11)
RDW: 14.7 % (ref 11.5–15.5)
WBC: 2.8 10*3/uL — ABNORMAL LOW (ref 4.0–10.5)
nRBC: 0 % (ref 0.0–0.2)

## 2019-04-20 LAB — COMPREHENSIVE METABOLIC PANEL
ALT: 16 U/L (ref 0–44)
AST: 40 U/L (ref 15–41)
Albumin: 2.3 g/dL — ABNORMAL LOW (ref 3.5–5.0)
Alkaline Phosphatase: 87 U/L (ref 38–126)
Anion gap: 7 (ref 5–15)
BUN: 15 mg/dL (ref 6–20)
CO2: 21 mmol/L — ABNORMAL LOW (ref 22–32)
Calcium: 8 mg/dL — ABNORMAL LOW (ref 8.9–10.3)
Chloride: 111 mmol/L (ref 98–111)
Creatinine, Ser: 0.7 mg/dL (ref 0.44–1.00)
GFR calc Af Amer: >60 mL/min (ref >60)
GFR calc non Af Amer: >60 mL/min (ref >60)
Glucose, Bld: 126 mg/dL — ABNORMAL HIGH (ref 70–99)
Potassium: 3.7 mmol/L (ref 3.5–5.1)
Sodium: 139 mmol/L (ref 135–145)
Total Bilirubin: 0.4 mg/dL (ref 0.3–1.2)
Total Protein: 6.5 g/dL (ref 6.5–8.1)

## 2019-04-20 LAB — URINE CULTURE: Culture: <10000 — AB

## 2019-04-20 MED ORDER — TRAZODONE HCL 50 MG PO TABS
50.0000 mg | ORAL_TABLET | Freq: Once | ORAL | Status: AC
  Administered 2019-04-20: 50 mg via ORAL
  Filled 2019-04-20: qty 1

## 2019-04-20 MED ORDER — LIP MEDEX EX OINT
TOPICAL_OINTMENT | CUTANEOUS | Status: AC
  Filled 2019-04-20: qty 7

## 2019-04-20 MED ORDER — ACETAMINOPHEN 325 MG PO TABS
650.0000 mg | ORAL_TABLET | Freq: Four times a day (QID) | ORAL | Status: DC | PRN
  Administered 2019-04-20: 650 mg via ORAL
  Filled 2019-04-20: qty 2

## 2019-04-20 MED ORDER — HYDROCODONE-ACETAMINOPHEN 5-325 MG PO TABS
1.0000 | ORAL_TABLET | Freq: Four times a day (QID) | ORAL | Status: DC | PRN
  Administered 2019-04-22: 1 via ORAL
  Filled 2019-04-20: qty 1

## 2019-04-20 MED ORDER — SODIUM CHLORIDE 0.9 % IV SOLN
INTRAVENOUS | Status: DC | PRN
  Administered 2019-04-20: 1000 mL via INTRAVENOUS

## 2019-04-20 NOTE — Plan of Care (Signed)
progressing 

## 2019-04-20 NOTE — Progress Notes (Signed)
[facility]  PROGRESS NOTE  [name] [name] JJO:[id number] DOB: June 10, 1996 DOA: 04/19/2019 PCP: [name], FNP   Brief HPI:   23 year old female with a history of lupus, class III lupus nephritis diagnosed after renal biopsy on immunosuppressive agents including CellCept, Prograf, history of renal stone on Norvasc, hypertension on low-dose Lopressor came to ED with complaints of dysuria for a month, diffuse itchy rash for a week facial swelling for 3 days.  Patient says that she stopped taking CellCept a week ago as it messed up with her teeth and gums.  Patient follows rheumatologist at [facility].  It was recommended to stop the immunosuppressive agents including Prograf and CellCept at this time and start prednisone 50 mg daily.  Patient also started on IV antibiotics for presumed UTI.    Subjective   Patient seen and examined, says that facial swelling has improved since yesterday.  Denies dysphagia or difficulty breathing.   Assessment/Plan:     1. Rash, facial edema-improving, no dysphagia, no airway compromise.  Unclear etiology, likely side effect from immunosuppressive therapy.  Immunosuppressive therapy is on hold as per rheumatology recommendation.  CT soft tissue showed nonspecific edema no drainable fluid collection.  Continue prednisone 50 mg daily.  2. ?  UTI-patient has no fever, no leukocytosis.  Denies dysuria at this time.  Follow urine culture.  Continue Rocephin for now.  3. Lupus nephritis-patient was prescribed CellCept and Prograf, she stopped taking CellCept a week ago due to discoloration of her teeth.  Prograf is also on hold as per rheumatology recommendation.  Continue prednisone 50 mg daily as per recommendation by [facility] rheumatology.   CBC: Recent Labs  Lab 04/19/19 1029 04/20/19 0301  WBC 2.7* 2.8*  NEUTROABS 2.0  --   HGB 9.3* 8.4*  HCT 30.4* 27.3*  MCV 92.1 91.3  PLT 161 160    Basic Metabolic Panel: Recent Labs  Lab  04/19/19 1029 04/20/19 0301  NA 137 139  K 3.8 3.7  CL 107 111  CO2 22 21*  GLUCOSE 88 126*  BUN 12 15  CREATININE 0.60 0.70  CALCIUM 8.3* 8.0*     DVT prophylaxis: Lovenox  Code Status: Full code  Family Communication: No family at bedside  Disposition Plan: likely home when medically ready for discharge   Scheduled medications:  . amLODipine  10 mg Oral Daily  . buPROPion  150 mg Oral Daily  . DULoxetine  60 mg Oral Daily  . enoxaparin (LOVENOX) injection  40 mg Subcutaneous Q24H  . ferrous sulfate  325 mg Oral BID WC  . folic acid  1 mg Oral BID  . gabapentin  300 mg Oral TID  . metoprolol tartrate  12.5 mg Oral BID  . mupirocin ointment  1 application Nasal BID  . pantoprazole  20 mg Oral QHS  . predniSONE  50 mg Oral Q breakfast    Consultants:    Procedures:     Antibiotics:   Anti-infectives (From admission, onward)   Start     Dose/Rate Route Frequency Ordered Stop   04/20/19 1400  cefTRIAXone (ROCEPHIN) 1 g in sodium chloride 0.9 % 100 mL IVPB     1 g 200 mL/hr over 30 Minutes Intravenous Every 24 hours 04/19/19 1755     04/19/19 1345  cefTRIAXone (ROCEPHIN) 1 g in sodium chloride 0.9 % 100 mL IVPB     1 g 200 mL/hr over 30 Minutes Intravenous  Once 04/19/19 1336 04/19/19 1506  Objective   Vitals:   04/19/19 1949 04/19/19 2145 04/20/19 0445 04/20/19 0857  BP:  125/79 119/78 121/69  Pulse: (!) 102 (!) 109 (!) 104 (!) 101  Resp:  18 18   Temp:  98 F (36.7 C) 98.7 F (37.1 C)   TempSrc:  Oral Oral   SpO2:  100% 100%   Weight:      Height:        Intake/Output Summary (Last 24 hours) at 04/20/2019 1132 Last data filed at 04/20/2019 3536 Gross per 24 hour  Intake 630 ml  Output 100 ml  Net 530 ml   Filed Weights   04/19/19 1714  Weight: 52.6 kg     Physical Examination:    General: Appears in no acute distress  Cardiovascular: S1-S2, regular  Respiratory: Clear to auscultation bilaterally  Abdomen: Abdomen is  soft, nontender, no organomegaly  Extremities: No edema in the lower extremities  Neurologic:  Alert, oriented x 3, no focal deficit  HEENT- facial swelling noted, no erythema     Data Reviewed: I have personally reviewed following labs and imaging studies   Recent Results (from the past 240 hour(s))  Urine culture     Status: Abnormal   Collection Time: 04/19/19 11:51 AM   Specimen: Urine, Random  Result Value Ref Range Status   Specimen Description   Final    URINE, RANDOM Performed at [facility], [place] [address] [place], [place] [address]    Special Requests   Final    NONE Performed at [facility], [place] [address], [place], [place] [address]    Culture (A)  Final    <10,000 COLONIES/mL INSIGNIFICANT GROWTH Performed at [facility] [place] [address] [place], [place] [address]    Report Status 04/20/2019 FINAL  Final  SARS Coronavirus 2 [facility] order, Performed in [facility] hospital lab) Nasopharyngeal Nasopharyngeal Swab     Status: None   Collection Time: 04/19/19  3:05 PM   Specimen: Nasopharyngeal Swab  Result Value Ref Range Status   SARS Coronavirus 2 NEGATIVE NEGATIVE Final    Comment: (NOTE) If result is NEGATIVE SARS-CoV-2 target nucleic acids are NOT DETECTED. The SARS-CoV-2 RNA is generally detectable in upper and lower  respiratory specimens during the acute phase of infection. The lowest  concentration of SARS-CoV-2 viral copies this assay can detect is 250  copies / mL. A negative result does not preclude SARS-CoV-2 infection  and should not be used as the sole basis for treatment or other  patient management decisions.  A negative result may occur with  improper specimen collection / handling, submission of specimen other  than nasopharyngeal swab, presence of viral mutation(s) within the  areas targeted by this assay, and inadequate number of viral copies  (<250 copies / mL). A negative result must be  combined with clinical  observations, patient history, and epidemiological information. If result is POSITIVE SARS-CoV-2 target nucleic acids are DETECTED. The SARS-CoV-2 RNA is generally detectable in upper and lower  respiratory specimens dur ing the acute phase of infection.  Positive  results are indicative of active infection with SARS-CoV-2.  Clinical  correlation with patient history and other diagnostic information is  necessary to determine patient infection status.  Positive results do  not rule out bacterial infection or co-infection with other viruses. If result is PRESUMPTIVE POSTIVE SARS-CoV-2 nucleic acids MAY BE PRESENT.   A presumptive positive result was obtained on the submitted specimen  and  confirmed on repeat testing.  While 2019 novel coronavirus  (SARS-CoV-2) nucleic acids may be present in the submitted sample  additional confirmatory testing may be necessary for epidemiological  and / or clinical management purposes  to differentiate between  SARS-CoV-2 and other Sarbecovirus currently known to infect humans.  If clinically indicated additional testing with an alternate test  methodology [phone]) is advised. The SARS-CoV-2 RNA is generally  detectable in upper and lower respiratory sp ecimens during the acute  phase of infection. The expected result is Negative. Fact Sheet for Patients:  StrictlyIdeas.no Fact Sheet for Healthcare Providers: BankingDealers.co.za This test is not yet approved or cleared by the [place] FDA and has been authorized for detection and/or diagnosis of SARS-CoV-2 by FDA under an Emergency Use Authorization (EUA).  This EUA will remain in effect (meaning this test can be used) for the duration of the COVID-19 declaration under Section 564(b)(1) of the Act, 21 U.S.C. section 360bbb-3(b)(1), unless the authorization is terminated or revoked sooner. Performed at [facility], [place] [address], [place], [place] [address]   Blood culture (routine x 2)     Status: None (Preliminary result)   Collection Time: 04/19/19  4:38 PM   Specimen: BLOOD RIGHT FOREARM  Result Value Ref Range Status   Specimen Description   Final    BLOOD RIGHT FOREARM Performed at [place] [address], [place], [place] [address]    Special Requests   Final    BOTTLES DRAWN AEROBIC AND ANAEROBIC Blood Culture adequate volume Performed at [place] [address] [place], [place] [address]    Culture   Final    NO GROWTH < 24 HOURS Performed at [facility] [address], [place], [place] [address]    Report Status PENDING  Incomplete  Blood culture (routine x 2)     Status: None (Preliminary result)   Collection Time: 04/19/19  5:43 PM   Specimen: BLOOD  Result Value Ref Range Status   Specimen Description   Final    BLOOD LEFT ANTECUBITAL Performed at [facility] [address], [place], [place] [address]    Special Requests   Final    BOTTLES DRAWN AEROBIC ONLY Blood Culture adequate volume Performed at [facility] [address], [place], [place] [address]    Culture   Final    NO GROWTH < 24 HOURS Performed at [facility] [address], [place], [place] [address]    Report Status PENDING  Incomplete     Liver Function Tests: Recent Labs  Lab 04/19/19 1029 04/20/19 0301  AST 49* 40  ALT 18 16  ALKPHOS 100 87  BILITOT 0.2* 0.4  PROT 7.0 6.5  ALBUMIN 2.4* 2.3*   No results for input(s): LIPASE, AMYLASE in the last 168 hours. No results for input(s): AMMONIA in the last 168 hours.  Cardiac Enzymes: No results for input(s): CKTOTAL, CKMB, CKMBINDEX, TROPONINI in the last 168 hours. BNP (last 3 results) No results for input(s): BNP in the last 8760 hours.  ProBNP (last 3 results) No results for input(s): PROBNP in the last 8760  hours.    Studies: Ct Soft Tissue Neck W Contrast  Result Date: 04/19/2019 CLINICAL DATA:  Facial swelling over the last 3 days. Rash. Anterior and submandibular pain. EXAM: CT NECK WITH CONTRAST TECHNIQUE: Multidetector CT imaging of the neck was performed using the standard protocol following the bolus  administration of intravenous contrast. CONTRAST:  73mL OMNIPAQUE IOHEXOL 300 MG/ML  SOLN COMPARISON:  None. FINDINGS: Pharynx and larynx: No mucosal or submucosal lesion is seen. No evidence of airway compromise. Salivary glands: Parotid and submandibular glands are normal. Submandibular glands are diminutive but do not show inflammation or a focal lesion. Thyroid: Normal Lymph nodes: No enlarged or low-density nodes on either side of the neck. Normal bilateral nodes. Vascular: Normal Limited intracranial: Normal Visualized orbits: Normal Mastoids and visualized paranasal sinuses: Clear Skeleton: Normal. No evidence of advanced dental or periodontal disease. Upper chest: Normal Other: Nonspecific edema pattern within the subcutaneous fat of the lower face and upper neck. No evidence of abscess. IMPRESSION: Nonspecific edema pattern within the subcutaneous fat of the lower face and upper neck. This could be due to cellulitis or nonspecific edema. No sign of abscess or lymphadenopathy. No sign of airway compromise. Electronically Signed   By: Nelson Chimes M.D.   On: 04/19/2019 12:34   Dg Chest Portable 1 View  Result Date: 04/19/2019 CLINICAL DATA:  Shortness of breath EXAM: PORTABLE CHEST 1 VIEW COMPARISON:  March 25, 2019 FINDINGS: The lungs are clear. Heart size and pulmonary vascularity are normal. No adenopathy. No pneumothorax. No bone lesions. IMPRESSION: No edema or consolidation. Electronically Signed   By: Lowella Grip III M.D.   On: 04/19/2019 10:16       Time spent: 20 min  [facility] Pager [phone]. If 7PM-7AM, please contact night-coverage at  [email or web address], Office  [phone]  password [email or web address]  04/20/2019, 11:32 AM  LOS: 0 days

## 2019-04-21 LAB — COMPREHENSIVE METABOLIC PANEL
ALT: 17 U/L (ref 0–44)
AST: 34 U/L (ref 15–41)
Albumin: 2.4 g/dL — ABNORMAL LOW (ref 3.5–5.0)
Alkaline Phosphatase: 100 U/L (ref 38–126)
Anion gap: 9 (ref 5–15)
BUN: 17 mg/dL (ref 6–20)
CO2: 20 mmol/L — ABNORMAL LOW (ref 22–32)
Calcium: 8.6 mg/dL — ABNORMAL LOW (ref 8.9–10.3)
Chloride: 113 mmol/L — ABNORMAL HIGH (ref 98–111)
Creatinine, Ser: 0.58 mg/dL (ref 0.44–1.00)
GFR calc Af Amer: >60 mL/min (ref >60)
GFR calc non Af Amer: >60 mL/min (ref >60)
Glucose, Bld: 108 mg/dL — ABNORMAL HIGH (ref 70–99)
Potassium: 3.5 mmol/L (ref 3.5–5.1)
Sodium: 142 mmol/L (ref 135–145)
Total Bilirubin: 0.1 mg/dL — ABNORMAL LOW (ref 0.3–1.2)
Total Protein: 6.6 g/dL (ref 6.5–8.1)

## 2019-04-21 LAB — CBC
HCT: 27.9 % — ABNORMAL LOW (ref 36.0–46.0)
Hemoglobin: 8.3 g/dL — ABNORMAL LOW (ref 12.0–15.0)
MCH: 28.7 pg (ref 26.0–34.0)
MCHC: 29.7 g/dL — ABNORMAL LOW (ref 30.0–36.0)
MCV: 96.5 fL (ref 80.0–100.0)
Platelets: 206 10*3/uL (ref 150–400)
RBC: 2.89 MIL/uL — ABNORMAL LOW (ref 3.87–5.11)
RDW: 15 % (ref 11.5–15.5)
WBC: 3.2 10*3/uL — ABNORMAL LOW (ref 4.0–10.5)
nRBC: 0 % (ref 0.0–0.2)

## 2019-04-21 NOTE — Progress Notes (Signed)
[facility]  PROGRESS NOTE  [name] [name] BWG:[id number] DOB: July 20, 1996 DOA: 04/19/2019 PCP: [name], FNP   Brief HPI:   23 year old female with a history of lupus, class III lupus nephritis diagnosed after renal biopsy on immunosuppressive agents including CellCept, Prograf, history of renal stone on Norvasc, hypertension on low-dose Lopressor came to ED with complaints of dysuria for a month, diffuse itchy rash for a week facial swelling for 3 days.  Patient says that she stopped taking CellCept a week ago as it messed up with her teeth and gums.  Patient follows rheumatologist at [facility].  It was recommended to stop the immunosuppressive agents including Prograf and CellCept at this time and start prednisone 50 mg daily.  Patient also started on IV antibiotics for presumed UTI.    Subjective   Patient seen and examined, facial swelling is improving slowly.  No itching.  Denies difficulty swallowing or difficulty breathing.   Assessment/Plan:     1. Rash, facial edema-improving, no dysphagia, no airway compromise.  Unclear etiology, likely side effect from immunosuppressive therapy or stopping immunosuppressive therapy a week ago.  Patient stopped taking CellCept a week ago..  Immunosuppressive therapy is on hold as per rheumatology recommendation.  CT soft tissue showed nonspecific edema no drainable fluid collection.  Continue prednisone 50 mg daily.  2. ?  UTI-patient has no fever, no leukocytosis. Denies dysuria at this time.  Urine culture shows insignificant growth.  Will discontinue Rocephin at this time.  3. Lupus nephritis-patient was prescribed CellCept and Prograf, she stopped taking CellCept a week ago due to discoloration of her teeth.  Prograf is also on hold as per rheumatology recommendation.  Continue prednisone 50 mg daily as per recommendation by [facility] rheumatology.   CBC: Recent Labs  Lab 04/19/19 1029 04/20/19 0301 04/21/19 0333  WBC 2.7*  2.8* 3.2*  NEUTROABS 2.0  --   --   HGB 9.3* 8.4* 8.3*  HCT 30.4* 27.3* 27.9*  MCV 92.1 91.3 96.5  PLT 161 165 177    Basic Metabolic Panel: Recent Labs  Lab 04/19/19 1029 04/20/19 0301 04/21/19 0333  NA 137 139 142  K 3.8 3.7 3.5  CL 107 111 113*  CO2 22 21* 20*  GLUCOSE 88 126* 108*  BUN 12 15 17   CREATININE 0.60 0.70 0.58  CALCIUM 8.3* 8.0* 8.6*     DVT prophylaxis: Lovenox  Code Status: Full code  Family Communication: No family at bedside  Disposition Plan: likely home when medically ready for discharge   Scheduled medications:  . amLODipine  10 mg Oral Daily  . buPROPion  150 mg Oral Daily  . DULoxetine  60 mg Oral Daily  . enoxaparin (LOVENOX) injection  40 mg Subcutaneous Q24H  . ferrous sulfate  325 mg Oral BID WC  . folic acid  1 mg Oral BID  . gabapentin  300 mg Oral TID  . metoprolol tartrate  12.5 mg Oral BID  . mupirocin ointment  1 application Nasal BID  . pantoprazole  20 mg Oral QHS  . predniSONE  50 mg Oral Q breakfast      Antibiotics:   Anti-infectives (From admission, onward)   Start     Dose/Rate Route Frequency Ordered Stop   04/20/19 1400  cefTRIAXone (ROCEPHIN) 1 g in sodium chloride 0.9 % 100 mL IVPB     1 g 200 mL/hr over 30 Minutes Intravenous Every 24 hours 04/19/19 1755     04/19/19 1345  cefTRIAXone (ROCEPHIN)  1 g in sodium chloride 0.9 % 100 mL IVPB     1 g 200 mL/hr over 30 Minutes Intravenous  Once 04/19/19 1336 04/19/19 1506       Objective   Vitals:   04/20/19 0857 04/20/19 1404 04/20/19 2030 04/21/19 0550  BP: 121/69 121/77 121/77 (!) 127/91  Pulse: (!) 101 (!) 106 (!) 105 (!) 105  Resp:  16 16 16   Temp:  98.6 F (37 C) 98.8 F (37.1 C) 98.8 F (37.1 C)  TempSrc:  Oral Oral   SpO2:    97%  Weight:      Height:        Intake/Output Summary (Last 24 hours) at 04/21/2019 1126 Last data filed at 04/21/2019 0745 Gross per 24 hour  Intake 832.13 ml  Output 400 ml  Net 432.13 ml   Filed Weights    04/19/19 1714  Weight: 52.6 kg     Physical Examination:   General-appears in no acute distress Heart-S1-S2, regular, no murmur auscultated Lungs-clear to auscultation bilaterally, no wheezing or crackles auscultated Abdomen-soft, nontender, no organomegaly Extremities-no edema in the lower extremities Neuro-alert, oriented x3, no focal deficit noted HEENT-facial swelling noted, no erythema or rashes noted.    Data Reviewed: I have personally reviewed following labs and imaging studies   Recent Results (from the past 240 hour(s))  Urine culture     Status: Abnormal   Collection Time: 04/19/19 11:51 AM   Specimen: Urine, Random  Result Value Ref Range Status   Specimen Description   Final    URINE, RANDOM Performed at [facility] [address], [place], [place] [address]    Special Requests   Final    NONE Performed at [facility], [place] [address], [place], [place] [address]    Culture (A)  Final    <10,000 COLONIES/mL INSIGNIFICANT GROWTH Performed at [facility] [address], [place], [place] [address]    Report Status 04/20/2019 FINAL  Final  SARS Coronavirus 2 [facility] order, Performed in [facility] hospital lab) Nasopharyngeal Nasopharyngeal Swab     Status: None   Collection Time: 04/19/19  3:05 PM   Specimen: Nasopharyngeal Swab  Result Value Ref Range Status   SARS Coronavirus 2 NEGATIVE NEGATIVE Final    Comment: (NOTE) If result is NEGATIVE SARS-CoV-2 target nucleic acids are NOT DETECTED. The SARS-CoV-2 RNA is generally detectable in upper and lower  respiratory specimens during the acute phase of infection. The lowest  concentration of SARS-CoV-2 viral copies this assay can detect is 250  copies / mL. A negative result does not preclude SARS-CoV-2 infection  and should not be used as the sole basis for treatment or other  patient management decisions.  A negative result may occur with  improper  specimen collection / handling, submission of specimen other  than nasopharyngeal swab, presence of viral mutation(s) within the  areas targeted by this assay, and inadequate number of viral copies  (<250 copies / mL). A negative result must be combined with clinical  observations, patient history, and epidemiological information. If result is POSITIVE SARS-CoV-2 target nucleic acids are DETECTED. The SARS-CoV-2 RNA is generally detectable in upper and lower  respiratory specimens dur ing the acute phase of infection.  Positive  results are indicative of active infection with SARS-CoV-2.  Clinical  correlation with patient history and other diagnostic information is  necessary to determine patient infection status.  Positive results do  not rule out bacterial  infection or co-infection with other viruses. If result is PRESUMPTIVE POSTIVE SARS-CoV-2 nucleic acids MAY BE PRESENT.   A presumptive positive result was obtained on the submitted specimen  and confirmed on repeat testing.  While 2019 novel coronavirus  (SARS-CoV-2) nucleic acids may be present in the submitted sample  additional confirmatory testing may be necessary for epidemiological  and / or clinical management purposes  to differentiate between  SARS-CoV-2 and other Sarbecovirus currently known to infect humans.  If clinically indicated additional testing with an alternate test  methodology [phone]) is advised. The SARS-CoV-2 RNA is generally  detectable in upper and lower respiratory sp ecimens during the acute  phase of infection. The expected result is Negative. Fact Sheet for Patients:  StrictlyIdeas.no Fact Sheet for Healthcare Providers: BankingDealers.co.za This test is not yet approved or cleared by the [place] FDA and has been authorized for detection and/or diagnosis of SARS-CoV-2 by FDA under an Emergency Use Authorization (EUA).  This EUA will remain in  effect (meaning this test can be used) for the duration of the COVID-19 declaration under Section 564(b)(1) of the Act, 21 U.S.C. section 360bbb-3(b)(1), unless the authorization is terminated or revoked sooner. Performed at [facility], [place] [address], [place], [place] [address]   Blood culture (routine x 2)     Status: None (Preliminary result)   Collection Time: 04/19/19  4:38 PM   Specimen: BLOOD RIGHT FOREARM  Result Value Ref Range Status   Specimen Description   Final    BLOOD RIGHT FOREARM Performed at [facility] [address], [place], [place] [address]    Special Requests   Final    BOTTLES DRAWN AEROBIC AND ANAEROBIC Blood Culture adequate volume Performed at [facility] [address] [place], [place] [address]    Culture   Final    NO GROWTH 2 DAYS Performed at [facility] [address], [place], [place] [address]    Report Status PENDING  Incomplete  Blood culture (routine x 2)     Status: None (Preliminary result)   Collection Time: 04/19/19  5:43 PM   Specimen: BLOOD  Result Value Ref Range Status   Specimen Description   Final    BLOOD LEFT ANTECUBITAL Performed at [facility] [address] [place], [place] [address]    Special Requests   Final    BOTTLES DRAWN AEROBIC ONLY Blood Culture adequate volume Performed at [facility] [address], [place], [place] [address]    Culture   Final    NO GROWTH 2 DAYS Performed at [facility] [address], [place], [place] [address]    Report Status PENDING  Incomplete     Liver Function Tests: Recent Labs  Lab 04/19/19 1029 04/20/19 0301 04/21/19 0333  AST 49* 40 34  ALT 18 16 17   ALKPHOS 100 87 100  BILITOT 0.2* 0.4 0.1*  PROT 7.0 6.5 6.6  ALBUMIN 2.4* 2.3* 2.4*   No results for input(s): LIPASE, AMYLASE in the last 168 hours. No results for input(s): AMMONIA in the last 168  hours.  Cardiac Enzymes: No results for input(s): CKTOTAL, CKMB, CKMBINDEX, TROPONINI in the last 168 hours. BNP (last 3 results) No results for input(s): BNP in the last 8760 hours.  ProBNP (last 3 results) No results for input(s): PROBNP in the last 8760 hours.    Studies: Ct Soft Tissue Neck W Contrast  Result Date:  04/19/2019 CLINICAL DATA:  Facial swelling over the last 3 days. Rash. Anterior and submandibular pain. EXAM: CT NECK WITH CONTRAST TECHNIQUE: Multidetector CT imaging of the neck was performed using the standard protocol following the bolus administration of intravenous contrast. CONTRAST:  41mL OMNIPAQUE IOHEXOL 300 MG/ML  SOLN COMPARISON:  None. FINDINGS: Pharynx and larynx: No mucosal or submucosal lesion is seen. No evidence of airway compromise. Salivary glands: Parotid and submandibular glands are normal. Submandibular glands are diminutive but do not show inflammation or a focal lesion. Thyroid: Normal Lymph nodes: No enlarged or low-density nodes on either side of the neck. Normal bilateral nodes. Vascular: Normal Limited intracranial: Normal Visualized orbits: Normal Mastoids and visualized paranasal sinuses: Clear Skeleton: Normal. No evidence of advanced dental or periodontal disease. Upper chest: Normal Other: Nonspecific edema pattern within the subcutaneous fat of the lower face and upper neck. No evidence of abscess. IMPRESSION: Nonspecific edema pattern within the subcutaneous fat of the lower face and upper neck. This could be due to cellulitis or nonspecific edema. No sign of abscess or lymphadenopathy. No sign of airway compromise. Electronically Signed   By: Nelson Chimes M.D.   On: 04/19/2019 12:34       Time spent: 20 min  [facility] Pager [phone]. If 7PM-7AM, please contact night-coverage at [email or web address], Office  [phone]  password [email or web address]  04/21/2019, 11:26 AM  LOS: 0 days

## 2019-04-22 MED ORDER — PREDNISONE 10 MG PO TABS: ORAL_TABLET | ORAL | 0 refills | Status: DC

## 2019-04-22 NOTE — Discharge Summary (Signed)
Physician Discharge Summary  ELINE GENG ZOX:[id number] DOB: 09/10/1996 DOA: 04/19/2019  PCP: [name], FNP  Admit date: 04/19/2019 Discharge date: 04/22/2019  Time spent: 40 minutes  Recommendations for Outpatient Follow-up:  1. Follow-up rheumatology at [facility] 1 to 2 weeks   Discharge Diagnoses:  Active Problems:   Dysuria   Facial edema   Rash due to systemic lupus erythematosus (SLE) (HCC)   Immunosuppressed status ([place])   Acute lower UTI   Discharge Condition: Stable  Diet recommendation: Regular diet  Filed Weights   04/19/19 1714  Weight: 52.6 kg    History of present illness:  23 year old female with a history of lupus, class III lupus nephritis diagnosed after renal biopsy on immunosuppressive agents including CellCept, Prograf, history of renal stone on Norvasc, hypertension on low-dose Lopressor came to ED with complaints of dysuria for a month, diffuse itchy rash for a week facial swelling for 3 days.  Patient says that she stopped taking CellCept a week ago as it messed up with her teeth and gums.  Patient follows rheumatologist at [facility].  It was recommended to stop the immunosuppressive agents including Prograf and CellCept at this time and start prednisone 50 mg daily.  Patient also started on IV antibiotics for presumed UTI.  Hospital Course:   1. Rash, facial edema-significantly improved,  no dysphagia, no airway compromise.  Unclear etiology, likely side effect from immunosuppressive therapy or stopping immunosuppressive therapy a week ago.  Patient stopped taking CellCept a week ago.. Immunosuppressive therapy is on hold as per rheumatology recommendation.  CT soft tissue showed nonspecific edema no drainable fluid collection.  Continue prednisone 50 mg daily.  I called and discussed with rheumatologist Dr. Wendall Mola, who recommends to discharge on prednisone 50 mg daily for 7 days then prednisone 40 mg daily for 7 days then prednisone 30 mg daily  for 7 days then prednisone 20 mg daily.  He will follow patient in his clinic in 1 to 2 weeks.  2. ?  UTI-patient has no fever, no leukocytosis. Denies dysuria at this time.  Urine culture shows insignificant growth.    Rocephin was discontinued.  3. Lupus nephritis-patient was prescribed CellCept and Prograf, she stopped taking CellCept a week ago due to discoloration of her teeth.  Prograf is also on hold as per rheumatology recommendation.  Continue prednisone taper as above.   Procedures:  None  Consultations:  None  Discharge Exam: Vitals:   04/22/19 0426 04/22/19 0953  BP: (!) 126/93 125/84  Pulse: (!) 102 96  Resp: 14   Temp: 98.7 F (37.1 C)   SpO2: 100%     General: Appears in no acute distress Cardiovascular: S1-S2, regular Respiratory: Clear to auscultation bilaterally Skin-facial edema has significantly reduced.  Dark brown macular rash on arms and thighs also improving.  Discharge Instructions   Discharge Instructions    Diet - low sodium heart healthy   Complete by: As directed    Increase activity slowly   Complete by: As directed      Allergies as of 04/22/2019      Reactions   Hydroxychloroquine Rash   Latex Rash      Medication List    STOP taking these medications   tacrolimus 1 MG capsule Commonly known as: Prograf     TAKE these medications   amLODipine 10 MG tablet Commonly known as: NORVASC Take 1 tablet (10 mg total) by mouth daily.   buPROPion 150 MG 24 hr tablet  Commonly known as: WELLBUTRIN XL Take 1 tablet (150 mg total) by mouth daily.   cyclobenzaprine 10 MG tablet Commonly known as: FLEXERIL Take 1 tablet (10 mg total) by mouth 2 (two) times daily as needed for muscle spasms.   diphenhydrAMINE 25 MG tablet Commonly known as: BENADRYL Take 25 mg by mouth every 6 (six) hours as needed for allergies.   DULoxetine 60 MG capsule Commonly known as: CYMBALTA Take 1 capsule (60 mg total) by mouth daily.   ferrous  sulfate 325 (65 FE) MG tablet Take 1 tablet (325 mg total) by mouth 2 (two) times daily. With 1/2 glass of orange juice   folic acid 1 MG tablet Commonly known as: FOLVITE Take 1 tablet (1 mg total) by mouth at bedtime. What changed:   when to take this  Another medication with the same name was removed. Continue taking this medication, and follow the directions you see here.   gabapentin 300 MG capsule Commonly known as: NEURONTIN Take 1 capsule (300 mg total) by mouth 3 (three) times daily.   HYDROcodone-acetaminophen 5-325 MG tablet Commonly known as: NORCO/VICODIN Take 1 tablet by mouth every 6 (six) hours as needed for up to 15 days.   loperamide 2 MG capsule Commonly known as: IMODIUM Take 1 capsule (2 mg total) by mouth as needed for diarrhea or loose stools.   medroxyPROGESTERone 150 MG/ML injection Commonly known as: DEPO-PROVERA Inject 150 mg into the muscle every 3 (three) months.   metoprolol tartrate 25 MG tablet Commonly known as: LOPRESSOR Take 0.5 tablets (12.5 mg total) by mouth 2 (two) times daily.   mupirocin ointment 2 % Commonly known as: Bactroban Place 1 application into the nose 2 (two) times daily.   nystatin 100000 UNIT/ML suspension Commonly known as: MYCOSTATIN Take 5 mLs (500,000 Units total) by mouth 2 (two) times daily as needed. What changed: reasons to take this   pantoprazole 20 MG tablet Commonly known as: PROTONIX Take 1 tablet (20 mg total) by mouth at bedtime.   predniSONE 10 MG tablet Commonly known as: DELTASONE Prednisone 50 mg po daily x 7 days then Prednisone 40 mg po daily x 7 days then Prednisone 30 mg po daily x 7 days then Prednisone 20 mg daily What changed:   medication strength  how much to take  how to take this  when to take this  additional instructions   Vitamin D (Ergocalciferol) 1.25 MG (50000 UT) Caps capsule Commonly known as: DRISDOL Take 1 capsule (50,000 Units total) by mouth 2 (two) times a  week.      Allergies  Allergen Reactions  . Hydroxychloroquine Rash  . Latex Rash      The results of significant diagnostics from this hospitalization (including imaging, microbiology, ancillary and laboratory) are listed below for reference.    Significant Diagnostic Studies: Ct Soft Tissue Neck W Contrast  Result Date: 04/19/2019 CLINICAL DATA:  Facial swelling over the last 3 days. Rash. Anterior and submandibular pain. EXAM: CT NECK WITH CONTRAST TECHNIQUE: Multidetector CT imaging of the neck was performed using the standard protocol following the bolus administration of intravenous contrast. CONTRAST:  1mL OMNIPAQUE IOHEXOL 300 MG/ML  SOLN COMPARISON:  None. FINDINGS: Pharynx and larynx: No mucosal or submucosal lesion is seen. No evidence of airway compromise. Salivary glands: Parotid and submandibular glands are normal. Submandibular glands are diminutive but do not show inflammation or a focal lesion. Thyroid: Normal Lymph nodes: No enlarged or low-density nodes on either side of the  neck. Normal bilateral nodes. Vascular: Normal Limited intracranial: Normal Visualized orbits: Normal Mastoids and visualized paranasal sinuses: Clear Skeleton: Normal. No evidence of advanced dental or periodontal disease. Upper chest: Normal Other: Nonspecific edema pattern within the subcutaneous fat of the lower face and upper neck. No evidence of abscess. IMPRESSION: Nonspecific edema pattern within the subcutaneous fat of the lower face and upper neck. This could be due to cellulitis or nonspecific edema. No sign of abscess or lymphadenopathy. No sign of airway compromise. Electronically Signed   By: Nelson Chimes M.D.   On: 04/19/2019 12:34   Dg Chest Portable 1 View  Result Date: 04/19/2019 CLINICAL DATA:  Shortness of breath EXAM: PORTABLE CHEST 1 VIEW COMPARISON:  March 25, 2019 FINDINGS: The lungs are clear. Heart size and pulmonary vascularity are normal. No adenopathy. No pneumothorax. No bone  lesions. IMPRESSION: No edema or consolidation. Electronically Signed   By: Lowella Grip III M.D.   On: 04/19/2019 10:16   Dg Chest Port 1 View  Result Date: 03/25/2019 CLINICAL DATA:  Dyspnea.  History of lupus. EXAM: PORTABLE CHEST 1 VIEW COMPARISON:  03/19/2019 FINDINGS: The heart size and mediastinal contours are within normal limits. Both lungs are clear. The visualized skeletal structures are unremarkable. IMPRESSION: Normal chest x-ray. Electronically Signed   By: Marijo Sanes M.D.   On: 03/25/2019 14:24    Microbiology: Recent Results (from the past 240 hour(s))  Urine culture     Status: Abnormal   Collection Time: 04/19/19 11:51 AM   Specimen: Urine, Random  Result Value Ref Range Status   Specimen Description   Final    URINE, RANDOM Performed at [facility] [address], [place], [place] [address]    Special Requests   Final    NONE Performed at [facility], [place] [address] [name],  [address]    Culture (A)  Final    <10,000 COLONIES/mL INSIGNIFICANT GROWTH Performed at [facility] [address], [place],  [address]    Report Status 04/20/2019 FINAL  Final  SARS Coronavirus 2 [facility] order, Performed in [facility] hospital lab) Nasopharyngeal Nasopharyngeal Swab     Status: None   Collection Time: 04/19/19  3:05 PM   Specimen: Nasopharyngeal Swab  Result Value Ref Range Status   SARS Coronavirus 2 NEGATIVE NEGATIVE Final    Comment: (NOTE) If result is NEGATIVE SARS-CoV-2 target nucleic acids are NOT DETECTED. The SARS-CoV-2 RNA is generally detectable in upper and lower  respiratory specimens during the acute phase of infection. The lowest  concentration of SARS-CoV-2 viral copies this assay can detect is 250  copies / mL. A negative result does not preclude SARS-CoV-2 infection  and should not be used as the sole basis for treatment or other  patient management decisions.  A negative  result may occur with  improper specimen collection / handling, submission of specimen other  than nasopharyngeal swab, presence of viral mutation(s) within the  areas targeted by this assay, and inadequate number of viral copies  (<250 copies / mL). A negative result must be combined with clinical  observations, patient history, and epidemiological information. If result is POSITIVE SARS-CoV-2 target nucleic acids are DETECTED. The SARS-CoV-2 RNA is generally detectable in upper and lower  respiratory specimens dur ing the acute phase of infection.  Positive  results are indicative of active infection with SARS-CoV-2.  Clinical  correlation with patient history and other diagnostic information is  necessary to determine  patient infection status.  Positive results do  not rule out bacterial infection or co-infection with other viruses. If result is PRESUMPTIVE POSTIVE SARS-CoV-2 nucleic acids MAY BE PRESENT.   A presumptive positive result was obtained on the submitted specimen  and confirmed on repeat testing.  While 2019 novel coronavirus  (SARS-CoV-2) nucleic acids may be present in the submitted sample  additional confirmatory testing may be necessary for epidemiological  and / or clinical management purposes  to differentiate between  SARS-CoV-2 and other Sarbecovirus currently known to infect humans.  If clinically indicated additional testing with an alternate test  methodology [id number]) is advised. The SARS-CoV-2 RNA is generally  detectable in upper and lower respiratory sp ecimens during the acute  phase of infection. The expected result is Negative. Fact Sheet for Patients:  StrictlyIdeas.no Fact Sheet for Healthcare Providers: BankingDealers.co.za This test is not yet approved or cleared by the [place] FDA and has been authorized for detection and/or diagnosis of SARS-CoV-2 by FDA under an Emergency Use Authorization  (EUA).  This EUA will remain in effect (meaning this test can be used) for the duration of the COVID-19 declaration under Section 564(b)(1) of the Act, 21 U.S.C. section 360bbb-3(b)(1), unless the authorization is terminated or revoked sooner. Performed at [facility], [place] [address] [place], [place] [address]   Blood culture (routine x 2)     Status: None (Preliminary result)   Collection Time: 04/19/19  4:38 PM   Specimen: BLOOD RIGHT FOREARM  Result Value Ref Range Status   Specimen Description   Final    BLOOD RIGHT FOREARM Performed at [facility] [address] [place], [place] [address]    Special Requests   Final    BOTTLES DRAWN AEROBIC AND ANAEROBIC Blood Culture adequate volume Performed at [facility] [address] [place], [place] [address]    Culture   Final    NO GROWTH 2 DAYS Performed at [facility] [address], [place], [place] [address]    Report Status PENDING  Incomplete  Blood culture (routine x 2)     Status: None (Preliminary result)   Collection Time: 04/19/19  5:43 PM   Specimen: BLOOD  Result Value Ref Range Status   Specimen Description   Final    BLOOD LEFT ANTECUBITAL Performed at [facility] [address], [place], [place] [address]    Special Requests   Final    BOTTLES DRAWN AEROBIC ONLY Blood Culture adequate volume Performed at [facility] [address], [place], [place] [address]    Culture   Final    NO GROWTH 2 DAYS Performed at [facility] [address] [place],  [address]    Report Status PENDING  Incomplete     Labs: Basic Metabolic Panel: Recent Labs  Lab 04/19/19 1029 04/20/19 0301 04/21/19 0333  NA 137 139 142  K 3.8 3.7 3.5  CL 107 111 113*  CO2 22 21* 20*  GLUCOSE 88 126* 108*  BUN 12 15 17   CREATININE 0.60 0.70 0.58  CALCIUM 8.3* 8.0* 8.6*   Liver Function Tests: Recent Labs   Lab 04/19/19 1029 04/20/19 0301 04/21/19 0333  AST 49* 40 34  ALT 18 16 17   ALKPHOS 921 87 100  BILITOT 0.2* 0.4 0.1*  PROT 7.0 6.5 6.6  ALBUMIN 2.4* 2.3* 2.4*   No results for input(s): LIPASE, AMYLASE in the last 168 hours.  No results for input(s): AMMONIA in the last 168 hours. CBC: Recent Labs  Lab 04/19/19 1029 04/20/19 0301 04/21/19 0333  WBC 2.7* 2.8* 3.2*  NEUTROABS 2.0  --   --   HGB 9.3* 8.4* 8.3*  HCT 30.4* 27.3* 27.9*  MCV 92.1 91.3 96.5  PLT 161 165 206       Signed:  Oswald Hillock MD.  [facility] 04/22/2019, 10:41 AM

## 2019-04-24 LAB — CULTURE, BLOOD (ROUTINE X 2)
Culture: NO GROWTH
Culture: NO GROWTH
Special Requests: ADEQUATE
Special Requests: ADEQUATE

## 2019-04-28 ENCOUNTER — Other Ambulatory Visit: Payer: Self-pay | Admitting: Family Medicine

## 2019-04-28 DIAGNOSIS — F321 Major depressive disorder, single episode, moderate: Secondary | ICD-10-CM

## 2019-05-08 ENCOUNTER — Inpatient Hospital Stay (HOSPITAL_COMMUNITY)
Admission: EM | Admit: 2019-05-08 | Discharge: 2019-05-11 | DRG: 300 | Disposition: A | Discharge status: Home / Self Care | Payer: Self-pay | Attending: Family Medicine | Admitting: Family Medicine

## 2019-05-08 ENCOUNTER — Emergency Department (HOSPITAL_COMMUNITY): Payer: Self-pay

## 2019-05-08 ENCOUNTER — Encounter: Payer: Self-pay | Admitting: Family Medicine

## 2019-05-08 ENCOUNTER — Emergency Department (HOSPITAL_COMMUNITY)
Admit: 2019-05-08 | Discharge: 2019-05-08 | Disposition: A | Discharge status: Home / Self Care | Payer: Self-pay | Attending: Emergency Medicine | Admitting: Emergency Medicine

## 2019-05-08 ENCOUNTER — Other Ambulatory Visit: Payer: Self-pay

## 2019-05-08 ENCOUNTER — Encounter (HOSPITAL_COMMUNITY): Payer: Self-pay

## 2019-05-08 ENCOUNTER — Ambulatory Visit (INDEPENDENT_AMBULATORY_CARE_PROVIDER_SITE_OTHER): Payer: Self-pay | Admitting: Family Medicine

## 2019-05-08 VITALS — BP 138/95 | HR 120 | Temp 99.7°F | Resp 18

## 2019-05-08 DIAGNOSIS — N3000 Acute cystitis without hematuria: Secondary | ICD-10-CM

## 2019-05-08 DIAGNOSIS — I517 Cardiomegaly: Secondary | ICD-10-CM | POA: Diagnosis present

## 2019-05-08 DIAGNOSIS — D509 Iron deficiency anemia, unspecified: Secondary | ICD-10-CM | POA: Diagnosis present

## 2019-05-08 DIAGNOSIS — Z9114 Patient's other noncompliance with medication regimen: Secondary | ICD-10-CM

## 2019-05-08 DIAGNOSIS — M7989 Other specified soft tissue disorders: Secondary | ICD-10-CM

## 2019-05-08 DIAGNOSIS — Z79899 Other long term (current) drug therapy: Secondary | ICD-10-CM

## 2019-05-08 DIAGNOSIS — E876 Hypokalemia: Secondary | ICD-10-CM | POA: Diagnosis not present

## 2019-05-08 DIAGNOSIS — M545 Low back pain: Secondary | ICD-10-CM | POA: Diagnosis present

## 2019-05-08 DIAGNOSIS — M329 Systemic lupus erythematosus, unspecified: Secondary | ICD-10-CM | POA: Diagnosis present

## 2019-05-08 DIAGNOSIS — I82431 Acute embolism and thrombosis of right popliteal vein: Secondary | ICD-10-CM

## 2019-05-08 DIAGNOSIS — A419 Sepsis, unspecified organism: Secondary | ICD-10-CM | POA: Diagnosis present

## 2019-05-08 DIAGNOSIS — Z7952 Long term (current) use of systemic steroids: Secondary | ICD-10-CM

## 2019-05-08 DIAGNOSIS — I824Y1 Acute embolism and thrombosis of unspecified deep veins of right proximal lower extremity: Secondary | ICD-10-CM

## 2019-05-08 DIAGNOSIS — N390 Urinary tract infection, site not specified: Secondary | ICD-10-CM | POA: Diagnosis present

## 2019-05-08 DIAGNOSIS — Z793 Long term (current) use of hormonal contraceptives: Secondary | ICD-10-CM

## 2019-05-08 DIAGNOSIS — I82411 Acute embolism and thrombosis of right femoral vein: Secondary | ICD-10-CM | POA: Diagnosis present

## 2019-05-08 DIAGNOSIS — F329 Major depressive disorder, single episode, unspecified: Secondary | ICD-10-CM | POA: Diagnosis present

## 2019-05-08 DIAGNOSIS — I82451 Acute embolism and thrombosis of right peroneal vein: Secondary | ICD-10-CM | POA: Diagnosis present

## 2019-05-08 DIAGNOSIS — R770 Abnormality of albumin: Secondary | ICD-10-CM | POA: Diagnosis present

## 2019-05-08 DIAGNOSIS — F3289 Other specified depressive episodes: Secondary | ICD-10-CM

## 2019-05-08 DIAGNOSIS — I82441 Acute embolism and thrombosis of right tibial vein: Principal | ICD-10-CM | POA: Diagnosis present

## 2019-05-08 DIAGNOSIS — R000 Tachycardia, unspecified: Secondary | ICD-10-CM | POA: Diagnosis present

## 2019-05-08 DIAGNOSIS — I82401 Acute embolism and thrombosis of unspecified deep veins of right lower extremity: Secondary | ICD-10-CM

## 2019-05-08 DIAGNOSIS — G4700 Insomnia, unspecified: Secondary | ICD-10-CM | POA: Diagnosis present

## 2019-05-08 DIAGNOSIS — Z888 Allergy status to other drugs, medicaments and biological substances status: Secondary | ICD-10-CM

## 2019-05-08 DIAGNOSIS — G8929 Other chronic pain: Secondary | ICD-10-CM | POA: Diagnosis present

## 2019-05-08 DIAGNOSIS — M79661 Pain in right lower leg: Secondary | ICD-10-CM

## 2019-05-08 DIAGNOSIS — I10 Essential (primary) hypertension: Secondary | ICD-10-CM | POA: Diagnosis present

## 2019-05-08 DIAGNOSIS — I4581 Long QT syndrome: Secondary | ICD-10-CM | POA: Diagnosis present

## 2019-05-08 DIAGNOSIS — I313 Pericardial effusion (noninflammatory): Secondary | ICD-10-CM | POA: Diagnosis present

## 2019-05-08 DIAGNOSIS — M170 Bilateral primary osteoarthritis of knee: Secondary | ICD-10-CM | POA: Diagnosis present

## 2019-05-08 DIAGNOSIS — Z20828 Contact with and (suspected) exposure to other viral communicable diseases: Secondary | ICD-10-CM | POA: Diagnosis present

## 2019-05-08 DIAGNOSIS — Z597 Insufficient social insurance and welfare support: Secondary | ICD-10-CM

## 2019-05-08 DIAGNOSIS — I119 Hypertensive heart disease without heart failure: Secondary | ICD-10-CM | POA: Diagnosis present

## 2019-05-08 DIAGNOSIS — M3214 Glomerular disease in systemic lupus erythematosus: Secondary | ICD-10-CM | POA: Diagnosis present

## 2019-05-08 DIAGNOSIS — I7300 Raynaud's syndrome without gangrene: Secondary | ICD-10-CM | POA: Diagnosis present

## 2019-05-08 DIAGNOSIS — I2721 Secondary pulmonary arterial hypertension: Secondary | ICD-10-CM | POA: Diagnosis present

## 2019-05-08 HISTORY — DX: Cardiomegaly: I51.7

## 2019-05-08 HISTORY — DX: Acute embolism and thrombosis of unspecified deep veins of right lower extremity: I82.401

## 2019-05-08 HISTORY — DX: Sepsis, unspecified organism: A41.9

## 2019-05-08 LAB — PROCALCITONIN: Procalcitonin: <0.1 ng/mL

## 2019-05-08 LAB — URINALYSIS, ROUTINE W REFLEX MICROSCOPIC
Bilirubin Urine: NEGATIVE
Glucose, UA: NEGATIVE mg/dL
Ketones, ur: NEGATIVE mg/dL
Nitrite: NEGATIVE
Protein, ur: >=300 mg/dL — AB
Specific Gravity, Urine: 1.009 (ref 1.005–1.030)
pH: 7 (ref 5.0–8.0)

## 2019-05-08 LAB — CBC WITH DIFFERENTIAL/PLATELET
Abs Immature Granulocytes: 0.09 10*3/uL — ABNORMAL HIGH (ref 0.00–0.07)
Basophils Absolute: 0 10*3/uL (ref 0.0–0.1)
Basophils Relative: 0 %
Eosinophils Absolute: 0 10*3/uL (ref 0.0–0.5)
Eosinophils Relative: 0 %
HCT: 33 % — ABNORMAL LOW (ref 36.0–46.0)
Hemoglobin: 10.2 g/dL — ABNORMAL LOW (ref 12.0–15.0)
Immature Granulocytes: 2 %
Lymphocytes Relative: 10 %
Lymphs Abs: 0.6 10*3/uL — ABNORMAL LOW (ref 0.7–4.0)
MCH: 28.7 pg (ref 26.0–34.0)
MCHC: 30.9 g/dL (ref 30.0–36.0)
MCV: 92.7 fL (ref 80.0–100.0)
Monocytes Absolute: 0.3 10*3/uL (ref 0.1–1.0)
Monocytes Relative: 6 %
Neutro Abs: 4.5 10*3/uL (ref 1.7–7.7)
Neutrophils Relative %: 82 %
Platelets: 190 10*3/uL (ref 150–400)
RBC: 3.56 MIL/uL — ABNORMAL LOW (ref 3.87–5.11)
RDW: 16 % — ABNORMAL HIGH (ref 11.5–15.5)
WBC: 5.6 10*3/uL (ref 4.0–10.5)
nRBC: 0.4 % — ABNORMAL HIGH (ref 0.0–0.2)

## 2019-05-08 LAB — MRSA PCR SCREENING: MRSA by PCR: NEGATIVE

## 2019-05-08 LAB — HEPARIN LEVEL (UNFRACTIONATED): Heparin Unfractionated: 0.11 IU/mL — ABNORMAL LOW (ref 0.30–0.70)

## 2019-05-08 LAB — COMPREHENSIVE METABOLIC PANEL
ALT: 20 U/L (ref 0–44)
AST: 33 U/L (ref 15–41)
Albumin: 2.6 g/dL — ABNORMAL LOW (ref 3.5–5.0)
Alkaline Phosphatase: 106 U/L (ref 38–126)
Anion gap: 10 (ref 5–15)
BUN: 15 mg/dL (ref 6–20)
CO2: 20 mmol/L — ABNORMAL LOW (ref 22–32)
Calcium: 8.5 mg/dL — ABNORMAL LOW (ref 8.9–10.3)
Chloride: 104 mmol/L (ref 98–111)
Creatinine, Ser: 0.76 mg/dL (ref 0.44–1.00)
GFR calc Af Amer: >60 mL/min (ref >60)
GFR calc non Af Amer: >60 mL/min (ref >60)
Glucose, Bld: 89 mg/dL (ref 70–99)
Potassium: 4.1 mmol/L (ref 3.5–5.1)
Sodium: 134 mmol/L — ABNORMAL LOW (ref 135–145)
Total Bilirubin: 0.4 mg/dL (ref 0.3–1.2)
Total Protein: 7.4 g/dL (ref 6.5–8.1)

## 2019-05-08 LAB — SARS CORONAVIRUS 2 BY RT PCR (HOSPITAL ORDER, PERFORMED IN ~~LOC~~ HOSPITAL LAB): SARS Coronavirus 2: NEGATIVE

## 2019-05-08 LAB — D-DIMER, QUANTITATIVE: D-Dimer, Quant: >20 ug/mL-FEU — ABNORMAL HIGH (ref 0.00–0.50)

## 2019-05-08 LAB — CK: Total CK: 35 U/L — ABNORMAL LOW (ref 38–234)

## 2019-05-08 LAB — TROPONIN I (HIGH SENSITIVITY): Troponin I (High Sensitivity): 4 ng/L (ref <18)

## 2019-05-08 LAB — LACTIC ACID, PLASMA
Lactic Acid, Venous: 1.1 mmol/L (ref 0.5–1.9)
Lactic Acid, Venous: 1.2 mmol/L (ref 0.5–1.9)

## 2019-05-08 MED ORDER — SODIUM CHLORIDE 0.9 % IV SOLN
2.0000 g | Freq: Three times a day (TID) | INTRAVENOUS | Status: DC
  Administered 2019-05-08 – 2019-05-10 (×6): 2 g via INTRAVENOUS
  Filled 2019-05-08 (×8): qty 2

## 2019-05-08 MED ORDER — PREDNISONE 20 MG PO TABS
40.0000 mg | ORAL_TABLET | Freq: Every day | ORAL | Status: DC
  Administered 2019-05-09 – 2019-05-11 (×3): 40 mg via ORAL
  Filled 2019-05-08 (×3): qty 2

## 2019-05-08 MED ORDER — FENTANYL CITRATE (PF) 100 MCG/2ML IJ SOLN
50.0000 ug | Freq: Once | INTRAMUSCULAR | Status: AC
  Administered 2019-05-08: 50 ug via INTRAVENOUS
  Filled 2019-05-08: qty 2

## 2019-05-08 MED ORDER — CHLORHEXIDINE GLUCONATE CLOTH 2 % EX PADS
6.0000 | MEDICATED_PAD | Freq: Every day | CUTANEOUS | Status: DC
  Administered 2019-05-08 – 2019-05-10 (×3): 6 via TOPICAL

## 2019-05-08 MED ORDER — ONDANSETRON HCL 4 MG PO TABS: 4.0000 mg | ORAL_TABLET | Freq: Four times a day (QID) | ORAL | Status: DC | PRN

## 2019-05-08 MED ORDER — HYDROCODONE-ACETAMINOPHEN 5-325 MG PO TABS
1.0000 | ORAL_TABLET | ORAL | Status: DC | PRN
  Administered 2019-05-08: 1 via ORAL
  Administered 2019-05-09: 2 via ORAL
  Administered 2019-05-09 (×2): 1 via ORAL
  Administered 2019-05-10 – 2019-05-11 (×6): 2 via ORAL
  Filled 2019-05-08 (×2): qty 2
  Filled 2019-05-08: qty 1
  Filled 2019-05-08: qty 2
  Filled 2019-05-08: qty 1
  Filled 2019-05-08 (×2): qty 2
  Filled 2019-05-08: qty 1
  Filled 2019-05-08 (×2): qty 2

## 2019-05-08 MED ORDER — ACETAMINOPHEN 500 MG PO TABS
1000.0000 mg | ORAL_TABLET | Freq: Once | ORAL | Status: AC
  Administered 2019-05-08: 1000 mg via ORAL
  Filled 2019-05-08: qty 2

## 2019-05-08 MED ORDER — HEPARIN (PORCINE) 25000 UT/250ML-% IV SOLN
1300.0000 [IU]/h | INTRAVENOUS | Status: DC
  Administered 2019-05-08: 1150 [IU]/h via INTRAVENOUS
  Filled 2019-05-08: qty 250

## 2019-05-08 MED ORDER — FERROUS SULFATE 325 (65 FE) MG PO TABS
325.0000 mg | ORAL_TABLET | Freq: Two times a day (BID) | ORAL | Status: DC
  Administered 2019-05-08 – 2019-05-11 (×6): 325 mg via ORAL
  Filled 2019-05-08 (×7): qty 1

## 2019-05-08 MED ORDER — PANTOPRAZOLE SODIUM 20 MG PO TBEC
20.0000 mg | DELAYED_RELEASE_TABLET | Freq: Every day | ORAL | Status: DC
  Administered 2019-05-08 – 2019-05-09 (×2): 20 mg via ORAL
  Filled 2019-05-08 (×5): qty 1

## 2019-05-08 MED ORDER — SODIUM CHLORIDE 0.9 % IV SOLN
1.0000 g | Freq: Once | INTRAVENOUS | Status: AC
  Administered 2019-05-08: 18:00:00 1 g via INTRAVENOUS
  Filled 2019-05-08: qty 10

## 2019-05-08 MED ORDER — SODIUM CHLORIDE 0.9 % IV BOLUS
1000.0000 mL | Freq: Once | INTRAVENOUS | Status: AC
  Administered 2019-05-08: 1000 mL via INTRAVENOUS

## 2019-05-08 MED ORDER — IOHEXOL 350 MG/ML SOLN
100.0000 mL | Freq: Once | INTRAVENOUS | Status: AC | PRN
  Administered 2019-05-08: 100 mL via INTRAVENOUS

## 2019-05-08 MED ORDER — ACETAMINOPHEN 325 MG PO TABS
650.0000 mg | ORAL_TABLET | Freq: Four times a day (QID) | ORAL | Status: DC | PRN
  Administered 2019-05-09: 650 mg via ORAL
  Filled 2019-05-08: qty 2

## 2019-05-08 MED ORDER — ONDANSETRON HCL 4 MG/2ML IJ SOLN: 4.0000 mg | Freq: Four times a day (QID) | INTRAMUSCULAR | Status: DC | PRN

## 2019-05-08 MED ORDER — ACETAMINOPHEN 650 MG RE SUPP: 650.0000 mg | Freq: Four times a day (QID) | RECTAL | Status: DC | PRN

## 2019-05-08 MED ORDER — METOPROLOL TARTRATE 12.5 MG HALF TABLET
12.5000 mg | ORAL_TABLET | Freq: Two times a day (BID) | ORAL | Status: DC
  Administered 2019-05-08 – 2019-05-10 (×4): 12.5 mg via ORAL
  Filled 2019-05-08 (×4): qty 1

## 2019-05-08 MED ORDER — SODIUM CHLORIDE 0.9 % IV SOLN
INTRAVENOUS | Status: AC
  Administered 2019-05-08: 22:00:00 via INTRAVENOUS

## 2019-05-08 MED ORDER — HEPARIN BOLUS VIA INFUSION
2000.0000 [IU] | Freq: Once | INTRAVENOUS | Status: AC
  Administered 2019-05-08: 2000 [IU] via INTRAVENOUS
  Filled 2019-05-08: qty 2000

## 2019-05-08 MED ORDER — BUPROPION HCL ER (XL) 150 MG PO TB24
150.0000 mg | ORAL_TABLET | Freq: Every day | ORAL | Status: DC
  Administered 2019-05-09 – 2019-05-11 (×3): 150 mg via ORAL
  Filled 2019-05-08 (×3): qty 1

## 2019-05-08 MED ORDER — GABAPENTIN 300 MG PO CAPS
300.0000 mg | ORAL_CAPSULE | Freq: Three times a day (TID) | ORAL | Status: DC
  Administered 2019-05-08 – 2019-05-11 (×9): 300 mg via ORAL
  Filled 2019-05-08 (×9): qty 1

## 2019-05-08 MED ORDER — SODIUM CHLORIDE 0.9 % IV BOLUS
1000.0000 mL | Freq: Once | INTRAVENOUS | Status: AC
  Administered 2019-05-08: 16:00:00 1000 mL via INTRAVENOUS

## 2019-05-08 MED ORDER — ONDANSETRON HCL 4 MG/2ML IJ SOLN
4.0000 mg | Freq: Once | INTRAMUSCULAR | Status: AC
  Administered 2019-05-08: 4 mg via INTRAVENOUS
  Filled 2019-05-08: qty 2

## 2019-05-08 NOTE — ED Provider Notes (Signed)
  Physical Exam  BP (!) 162/121   Pulse (!) 130   Temp (!) 100.8 F (38.2 C) (Rectal)   Resp 14   Ht 5\' 3"  (1.6 m)   Wt 63.5 kg   SpO2 100%   BMI 24.80 kg/m   Physical Exam  ED Course/Procedures     Procedures  MDM  Patient care assumed at 3 pm.  Patient has been having shortness of breath as well as right leg pain for the last several days .  Recently admitted for UTI and urine culture was negative.  Patient arrived tachycardic and low-grade temperature.  D-dimer was elevated so signed out pending right leg DVT study as well as CTA to rule out PE  6:23 PM Patient developed a fever 100.8 in the ED. Her UA showed possible UTI again.  She remained tachycardic as well.  Her initial IV infiltrated so I placed another IV and CTA showed no obvious PE.  However, my suspicion for PE is still high given that she is tachycardic. She can also be septic from her UTI and that is causing her fever and tachycardia.  Given IV antibiotics and IV heparin. Will admit.   CRITICAL CARE Performed by: Wandra Arthurs   Total critical care time: 30 minutes  Critical care time was exclusive of separately billable procedures and treating other patients.  Critical care was necessary to treat or prevent imminent or life-threatening deterioration.  Critical care was time spent personally by me on the following activities: development of treatment plan with patient and/or surrogate as well as nursing, discussions with consultants, evaluation of patient's response to treatment, examination of patient, obtaining history from patient or surrogate, ordering and performing treatments and interventions, ordering and review of laboratory studies, ordering and review of radiographic studies, pulse oximetry and re-evaluation of patient's condition.  Angiocath insertion Performed by: Wandra Arthurs  Consent: Verbal consent obtained. Risks and benefits: risks, benefits and alternatives were discussed Time out: Immediately  prior to procedure a "time out" was called to verify the correct patient, procedure, equipment, support staff and site/side marked as required.  Preparation: Patient was prepped and draped in the usual sterile fashion.  Vein Location: L antecube  Ultrasound Guided  Gauge: 20 long   Normal blood return and flush without difficulty Patient tolerance: Patient tolerated the procedure well with no immediate complications.         Drenda Freeze, MD 05/08/19 [id number]

## 2019-05-08 NOTE — Patient Instructions (Signed)
Systemic Lupus Erythematosus, Adult  Systemic lupus erythematosus (SLE) is a long-term (chronic) disease that can affect many parts of the body. SLE is an autoimmune disease. With this type of disease, the body's defense system (immune system) mistakenly attacks healthy tissues. This can cause damage to the skin, joints, blood vessels, brain, kidneys, lungs, heart, and other internal organs. It causes pain, irritation, and inflammation.  What are the causes?  The cause of this condition is not known.  What increases the risk?  The following factors may make you more likely to develop this condition:  · Being female.  · Being of Asian, Hispanic, or African-American descent.  · Having a family history of the condition.  · Being exposed to tobacco smoke or smoking cigarettes.  · Having an infection with a virus, such as Epstein-Barr virus.  · Having a history of exposure to silica dust, metals, chemicals, mold or mildew, or insecticides.  · Using oral contraceptives or hormone replacement therapy.  What are the signs or symptoms?  This condition can affect almost any organ or system in the body. Symptoms of the condition depend on which organ or system is affected.  The most common symptoms include:  · Fever.  · Fatigue.  · Weight loss.  · Muscle aches.  · Joint pain.  · Skin rashes, especially over the nose and cheeks (butterfly rash) and after sun exposure.  Symptoms can come and go. A period of time when symptoms get worse or come back is called a flare. A period of time with no symptoms is called a remission.  How is this diagnosed?  This condition is diagnosed based on:  · Your symptoms.  · Your medical history.  · A physical exam.  You may also have tests, including:  · Blood tests.  · Urine tests.  · A chest X-ray.  You may be referred to an autoimmune disease specialist (rheumatologist).  How is this treated?  There is no cure for this condition, but treatment can help to control symptoms, prevent flares (keep  symptoms in remission), and prevent damage to the heart, lungs, kidneys, and other organs. Treatment will depend on what symptoms you are having and what organs or systems are affected. Treatment may involve taking a combination of medicines over time.  Common medicines used to treat this condition include:  · Antimalarial medicines to control symptoms, prevent flares, and protect against organ damage.  · Corticosteroids and NSAIDs to reduce inflammation.  · Medicines to weaken your immune system (immunosuppressants).  · Biologic response modifiers to reduce inflammation and damage.  Follow these instructions at home:  Eating and drinking  · Eat a heart-healthy diet. This may include:  ? Eating high-fiber foods, such as fresh fruits and vegetables, whole grains, and beans.  ? Eating heart-healthy fats (omega-3 fats), such as fish, flaxseed, and flaxseed oil.  ? Limiting foods that are high in saturated fat and cholesterol, such as processed and fried foods, fatty meat, and full-fat dairy.  ? Limiting how much salt (sodium) you eat.  · Include calcium and vitamin D in your diet. Good sources of calcium and vitamin D include:  ? Low-fat dairy products such as milk, yogurt, and cheese.  ? Certain fish, such as fresh or canned salmon, tuna, and sardines.  ? Products that have calcium and vitamin D added to them (fortified products), such as fortified cereals or juice.  Medicines  · Take over-the-counter and prescription medicines only as told by your health   that contain nicotine or tobacco, such as cigarettes and e-cigarettes. If you need help quitting, ask your health care provider.  Protect your skin from the sun by applying  sunblock and wearing protective hats and clothing.  Learn as much as you can about your condition and have a good support system in place. Support may come from family, friends, or a lupus support group. General instructions  Work closely with all of your health care providers to manage your condition.  Stay up to date on all vaccines as directed by your health care provider.  Keep all follow-up visits as told by your health care provider. This is important. Contact a health care provider if:  You have a fever.  Your symptoms flare.  You develop new symptoms.  You have bloody, foamy, or coffee-colored urine.  There are changes in your urination. For example, you urinate more often at night.  You think that you may be depressed or have anxiety.  You become pregnant or plan to become pregnant. Pregnancy in women with this condition is considered high risk. Get help right away if:  You have chest pain.  You have trouble breathing.  You have a seizure.  You suddenly get a very bad headache.  You suddenly develop facial or body weakness.  You cannot speak.  You cannot understand speech. These symptoms may represent a serious problem that is an emergency. Do not wait to see if the symptoms will go away. Get medical help right away. Call your local emergency services (911 in the [place] Do not drive yourself to the hospital. Summary  Systemic lupus erythematosus (SLE) is a long-term disease that can affect many parts of the body.  SLE is an autoimmune disease. That means your body's defense system (immune system) mistakenly attacks healthy tissues.  There is no cure for this condition, but treatment can help to control symptoms, prevent flares, and prevent damage to your organs. Treatment may involve taking a combination of medicines over time. This information is not intended to replace advice given to you by your health care provider. Make sure you discuss any questions you  have with your health care provider. Document Released: 08/19/2002 Document Revised: 10/12/2017 Document Reviewed: 10/06/2017 Elsevier Patient Education  [place]. Chronic Pain, Adult Chronic pain is a type of pain that lasts or keeps coming back (recurs) for at least six months. You may have chronic headaches, abdominal pain, or body pain. Chronic pain may be related to an illness, such as fibromyalgia or complex regional pain syndrome. Sometimes the cause of chronic pain is not known. Chronic pain can make it hard for you to do daily activities. If not treated, chronic pain can lead to other health problems, including anxiety and depression. Treatment depends on the cause and severity of your pain. You may need to work with a pain specialist to come up with a treatment plan. The plan may include medicine, counseling, and physical therapy. Many people benefit from a combination of two or more types of treatment to control their pain. Follow these instructions at home: Lifestyle  Consider keeping a pain diary to share with your health care providers.  Consider talking with a mental health care provider (psychologist) about how to cope with chronic pain.  Consider joining a chronic pain support group.  Try to control or lower your stress levels. Talk to your health care provider about strategies to do this. General instructions   Take over-the-counter and prescription medicines only as told  by your health care provider.  Follow your treatment plan as told by your health care provider. This may include: ? Gentle, regular exercise. ? Eating a healthy diet that includes foods such as vegetables, fruits, fish, and lean meats. ? Cognitive or behavioral therapy. ? Working with a Community education officer. ? Meditation or yoga. ? Acupuncture or massage therapy. ? Aroma, color, light, or sound therapy. ? Local electrical stimulation. ? Shots (injections) of numbing or pain-relieving medicines  into the spine or the area of pain.  Check your pain level as told by your health care provider. Ask your health care provider if you should use a pain scale.  Learn as much as you can about how to manage your chronic pain. Ask your health care provider if an intensive pain rehabilitation program or a chronic pain specialist would be helpful.  Keep all follow-up visits as told by your health care provider. This is important. Contact a health care provider if:  Your pain gets worse.  You have new pain.  You have trouble sleeping.  You have trouble doing your normal activities.  Your pain is not controlled with treatment.  Your have side effects from pain medicine.  You feel weak. Get help right away if:  You lose feeling or have numbness in your body.  You lose control of bowel or bladder function.  Your pain suddenly gets much worse.  You develop shaking or chills.  You develop confusion.  You develop chest pain.  You have trouble breathing or shortness of breath.  You pass out.  You have thoughts about hurting yourself or others. This information is not intended to replace advice given to you by your health care provider. Make sure you discuss any questions you have with your health care provider. Document Released: 05/21/2002 Document Revised: 08/11/2017 Document Reviewed: 02/16/2016 Elsevier Patient Education  2020 [employer].

## 2019-05-08 NOTE — ED Notes (Signed)
X-ray at bedside

## 2019-05-08 NOTE — ED Notes (Signed)
ED TO INPATIENT HANDOFF REPORT  Name/Age/Gender [name] [name] 23 y.o. female  Code Status Code Status History    Date Active Date Inactive Code Status Order ID Comments User Context   04/19/2019 1756 04/22/2019 1756 Full Code [id number]  Florencia Reasons, MD Inpatient   03/19/2019 1515 03/25/2019 2108 Full Code [id number]  Kerney Elbe, DO Inpatient   06/10/2018 0100 06/11/2018 1638 Full Code [id number]  Reubin Milan, MD Inpatient   06/05/2018 1528 06/09/2018 1605 Full Code [id number]  Karmen Bongo, MD ED   01/08/2018 2308 01/13/2018 2009 Full Code [id number]  Etta Quill, DO ED   Advance Care Planning Activity      Home/SNF/Other Home  Chief Complaint Right Shoulder Pain; Right leg Pain  Level of Care/Admitting Diagnosis ED Disposition    ED Disposition Condition [facility] Area: [place] [[id number]]  Level of Care: Stepdown [14]  Admit to SDU based on following criteria: Hemodynamic compromise or significant risk of instability:  Patient requiring short term acute titration and management of vasoactive drips, and invasive monitoring (i.e., CVP and Arterial line).  Covid Evaluation: Confirmed COVID Negative  [facility]: [id number]  Admitting Physician: Toy Baker [[id number]]  Attending Physician: Toy Baker [[id number]]  Estimated length of stay: 3 - 4 days  Certification:: I certify this patient will need inpatient services for at least 2 midnights  PT Class (Do Not Modify): Inpatient [101]  PT Acc Code (Do Not Modify): Private [1]       Medical History Past Medical History:  Diagnosis Date  . Gout   . Hypertension   . Lupus ([place])   . Raynaud disease     Allergies Allergies  Allergen Reactions  . Hydroxychloroquine Rash  . Latex Rash    IV Location/Drains/Wounds Patient Lines/Drains/Airways Status   Active Line/Drains/Airways    Name:   Placement date:   Placement time:   Site:   Days:   Peripheral IV  05/08/19 Left Forearm   05/08/19    1654    Forearm   less than 1   Wound / Incision (Open or Dehisced) 01/09/18 Buttocks Left;Right Raw areas noted between pt buttocks, painful/red   01/09/18    0100    Buttocks   484          Labs/Imaging Results for orders placed or performed during the hospital encounter of 05/08/19 (from the past 48 hour(s))  CBC with Differential     Status: Abnormal   Collection Time: 05/08/19  1:29 PM  Result Value Ref Range   WBC 5.6 4.0 - 10.5 K/uL   RBC 3.56 (L) 3.87 - 5.11 MIL/uL   Hemoglobin 10.2 (L) 12.0 - 15.0 g/dL   HCT 33.0 (L) 36.0 - 46.0 %   MCV 92.7 80.0 - 100.0 fL   MCH 28.7 26.0 - 34.0 pg   MCHC 30.9 30.0 - 36.0 g/dL   RDW 16.0 (H) 11.5 - 15.5 %   Platelets 190 150 - 400 K/uL   nRBC 0.4 (H) 0.0 - 0.2 %   Neutrophils Relative % 82 %   Neutro Abs 4.5 1.7 - 7.7 K/uL   Lymphocytes Relative 10 %   Lymphs Abs 0.6 (L) 0.7 - 4.0 K/uL   Monocytes Relative 6 %   Monocytes Absolute 0.3 0.1 - 1.0 K/uL   Eosinophils Relative 0 %   Eosinophils Absolute 0.0 0.0 - 0.5 K/uL   Basophils Relative 0 %   Basophils Absolute 0.0  0.0 - 0.1 K/uL   Immature Granulocytes 2 %   Abs Immature Granulocytes 0.09 (H) 0.00 - 0.07 K/uL    Comment: Performed at [facility], [place] [address] [place], [place] [address]  Comprehensive metabolic panel     Status: Abnormal   Collection Time: 05/08/19  1:29 PM  Result Value Ref Range   Sodium 134 (L) 135 - 145 mmol/L   Potassium 4.1 3.5 - 5.1 mmol/L   Chloride 104 98 - 111 mmol/L   CO2 20 (L) 22 - 32 mmol/L   Glucose, Bld 89 70 - 99 mg/dL   BUN 15 6 - 20 mg/dL   Creatinine, Ser 0.76 0.44 - 1.00 mg/dL   Calcium 8.5 (L) 8.9 - 10.3 mg/dL   Total Protein 7.4 6.5 - 8.1 g/dL   Albumin 2.6 (L) 3.5 - 5.0 g/dL   AST 33 15 - 41 U/L   ALT 20 0 - 44 U/L   Alkaline Phosphatase 106 38 - 126 U/L   Total Bilirubin 0.4 0.3 - 1.2 mg/dL   GFR calc non Af Amer >60 >60 mL/min   GFR calc Af Amer >60 >60 mL/min   Anion  gap 10 5 - 15    Comment: Performed at [facility], [place] [address] [place], [place] [address]  CK     Status: Abnormal   Collection Time: 05/08/19  1:29 PM  Result Value Ref Range   Total CK 35 (L) 38 - 234 U/L    Comment: Performed at [facility], [place] [address] [place], [place] [address]  D-dimer, quantitative (not at [facility])     Status: Abnormal   Collection Time: 05/08/19  1:29 PM  Result Value Ref Range   D-Dimer, Quant >20.00 (H) 0.00 - 0.50 ug/mL-FEU    Comment: REPEATED TO VERIFY (NOTE) At the manufacturer cut-off of 0.50 ug/mL FEU, this assay has been documented to exclude PE with a sensitivity and negative predictive value of 97 to 99%.  At this time, this assay has not been approved by the FDA to exclude DVT/VTE. Results should be correlated with clinical presentation. Performed at [facility], [place] [address], [place], [place] [address]   Troponin I (High Sensitivity)     Status: None   Collection Time: 05/08/19  1:29 PM  Result Value Ref Range   Troponin I (High Sensitivity) 4 <18 ng/L    Comment: (NOTE) Elevated high sensitivity troponin I (hsTnI) values and significant  changes across serial measurements may suggest ACS but many other  chronic and acute conditions are known to elevate hsTnI results.  Refer to the "Links" section for chest pain algorithms and additional  guidance. Performed at [facility], [place] [address] [place], [place] [address]   Urinalysis, Routine w reflex microscopic     Status: Abnormal   Collection Time: 05/08/19  3:23 PM  Result Value Ref Range   Color, Urine YELLOW YELLOW   APPearance HAZY (A) CLEAR   Specific Gravity, Urine 1.009 1.005 - 1.030   pH 7.0 5.0 - 8.0   Glucose, UA NEGATIVE NEGATIVE mg/dL   Hgb urine dipstick SMALL (A) NEGATIVE   Bilirubin Urine NEGATIVE NEGATIVE   Ketones, ur NEGATIVE NEGATIVE mg/dL   Protein, ur >=300 (A) NEGATIVE mg/dL    Nitrite NEGATIVE NEGATIVE   Leukocytes,Ua MODERATE (A) NEGATIVE   RBC / HPF 6-10 0 - 5 RBC/hpf   WBC, UA 21-50 0 - 5 WBC/hpf  Bacteria, UA FEW (A) NONE SEEN   Squamous Epithelial / LPF 0-5 0 - 5   Mucus PRESENT    Hyaline Casts, UA PRESENT    Granular Casts, UA PRESENT     Comment: Performed at [facility], [place] [address], [place], [place] [address]  SARS Coronavirus 2 [facility] order, Performed in [facility] hospital lab) Nasopharyngeal Nasopharyngeal Swab     Status: None   Collection Time: 05/08/19  3:46 PM   Specimen: Nasopharyngeal Swab  Result Value Ref Range   SARS Coronavirus 2 NEGATIVE NEGATIVE    Comment: (NOTE) If result is NEGATIVE SARS-CoV-2 target nucleic acids are NOT DETECTED. The SARS-CoV-2 RNA is generally detectable in upper and lower  respiratory specimens during the acute phase of infection. The lowest  concentration of SARS-CoV-2 viral copies this assay can detect is 250  copies / mL. A negative result does not preclude SARS-CoV-2 infection  and should not be used as the sole basis for treatment or other  patient management decisions.  A negative result may occur with  improper specimen collection / handling, submission of specimen other  than nasopharyngeal swab, presence of viral mutation(s) within the  areas targeted by this assay, and inadequate number of viral copies  (<250 copies / mL). A negative result must be combined with clinical  observations, patient history, and epidemiological information. If result is POSITIVE SARS-CoV-2 target nucleic acids are DETECTED. The SARS-CoV-2 RNA is generally detectable in upper and lower  respiratory specimens dur ing the acute phase of infection.  Positive  results are indicative of active infection with SARS-CoV-2.  Clinical  correlation with patient history and other diagnostic information is  necessary to determine patient infection status.  Positive results do  not rule out bacterial  infection or co-infection with other viruses. If result is PRESUMPTIVE POSTIVE SARS-CoV-2 nucleic acids MAY BE PRESENT.   A presumptive positive result was obtained on the submitted specimen  and confirmed on repeat testing.  While 2019 novel coronavirus  (SARS-CoV-2) nucleic acids may be present in the submitted sample  additional confirmatory testing may be necessary for epidemiological  and / or clinical management purposes  to differentiate between  SARS-CoV-2 and other Sarbecovirus currently known to infect humans.  If clinically indicated additional testing with an alternate test  methodology [phone]) is advised. The SARS-CoV-2 RNA is generally  detectable in upper and lower respiratory sp ecimens during the acute  phase of infection. The expected result is Negative. Fact Sheet for Patients:  StrictlyIdeas.no Fact Sheet for Healthcare Providers: BankingDealers.co.za This test is not yet approved or cleared by the [place] FDA and has been authorized for detection and/or diagnosis of SARS-CoV-2 by FDA under an Emergency Use Authorization (EUA).  This EUA will remain in effect (meaning this test can be used) for the duration of the COVID-19 declaration under Section 564(b)(1) of the Act, 21 U.S.C. section 360bbb-3(b)(1), unless the authorization is terminated or revoked sooner. Performed at [facility], [place] [address] [place], [place] [address]   Lactic acid, plasma     Status: None   Collection Time: 05/08/19  3:47 PM  Result Value Ref Range   Lactic Acid, Venous 1.1 0.5 - 1.9 mmol/L    Comment: Performed at [facility], [place] [address] [place], [place] [address]   Ct Angio Chest Pe W And/or Wo Contrast  Result Date: 05/08/2019 CLINICAL DATA:  Elevated D-dimer. Shortness of breath. History of lupus. Evaluate for  pulmonary embolism. EXAM: CT ANGIOGRAPHY CHEST WITH CONTRAST TECHNIQUE:  Multidetector CT imaging of the chest was performed using the standard protocol during bolus administration of intravenous contrast. Multiplanar CT image reconstructions and MIPs were obtained to evaluate the vascular anatomy. CONTRAST:  13mL OMNIPAQUE IOHEXOL 350 MG/ML SOLN COMPARISON:  Chest CT-06/10/2018; 05/30/2018 FINDINGS: Vascular Findings: There is adequate opacification of the pulmonary arterial system with the main pulmonary artery measuring 400 Hounsfield units. There are no discrete filling defects within the pulmonary arterial tree to suggest pulmonary embolism. Borderline enlarged caliber of the main pulmonary artery measuring 32 mm in diameter, similar to the 01/2019 examination. Borderline cardiomegaly. No pericardial effusion. No evidence of thoracic aortic aneurysm or dissection on this non gated examination. The left vertebral artery is incidentally noted to arise directly from the aortic arch. The branch vessels of the aortic arch appear patent throughout their imaged courses. Review of the MIP images confirms the above findings. ---------------------------------------------------------------------------------- Nonvascular Findings: Mediastinum/Lymph Nodes: No bulky mediastinal, hilar or axillary lymphadenopathy. Lungs/Pleura: Minimal nodular subsegmental atelectasis involving the medial basilar segment of the left lower lobe (image 90, series 11). Minimal dependent subpleural ground-glass atelectasis. No air bronchograms. No pleural effusion or pneumothorax. The central pulmonary airways appear widely patent. No discrete pulmonary nodules. Upper abdomen: Limited early arterial phase evaluation of the upper abdomen is normal. Musculoskeletal: No acute or aggressive osseous abnormalities. Regional soft tissues appear normal. Normal appearance of the thyroid gland. IMPRESSION: 1. No acute cardiopulmonary disease. Specifically, no evidence of pulmonary embolism. 2. Redemonstrated borderline  cardiomegaly enlargement of the caliber of the main pulmonary artery, nonspecific though could be seen in the setting of pulmonary arterial hypertension. Further evaluation with cardiac echo could performed as indicated. Electronically Signed   By: Sandi Mariscal M.D.   On: 05/08/2019 17:31   Dg Chest Portable 1 View  Result Date: 05/08/2019 CLINICAL DATA:  Shortness of breath EXAM: PORTABLE CHEST 1 VIEW COMPARISON:  04/19/2019 FINDINGS: Heart and mediastinal contours are within normal limits. No focal opacities or effusions. No acute bony abnormality. IMPRESSION: No active disease. Electronically Signed   By: Rolm Baptise M.D.   On: 05/08/2019 15:18   Vas [place] Lower Extremity Venous (dvt) (only [facility])  Result Date: 05/08/2019  Lower Venous Study Indications: Swelling.  Risk Factors: None identified. Comparison Study: 03/22/2019 - Negative for DVT. Performing Technologist: Oliver Hum RVT  Examination Guidelines: A complete evaluation includes B-mode imaging, spectral Doppler, color Doppler, and power Doppler as needed of all accessible portions of each vessel. Bilateral testing is considered an integral part of a complete examination. Limited examinations for reoccurring indications may be performed as noted.  +---------+---------------+---------+-----------+----------+--------------+ RIGHT    CompressibilityPhasicitySpontaneityPropertiesThrombus Aging +---------+---------------+---------+-----------+----------+--------------+ CFV      Full           Yes      Yes                                 +---------+---------------+---------+-----------+----------+--------------+ SFJ      Full                                                        +---------+---------------+---------+-----------+----------+--------------+ FV Prox  Full                                                        +---------+---------------+---------+-----------+----------+--------------+  FV Mid   None            No       No                   Acute          +---------+---------------+---------+-----------+----------+--------------+ FV DistalNone           No       No                   Acute          +---------+---------------+---------+-----------+----------+--------------+ PFV      Full                                                        +---------+---------------+---------+-----------+----------+--------------+ POP      None           No       No                   Acute          +---------+---------------+---------+-----------+----------+--------------+ PTV      Partial                                      Acute          +---------+---------------+---------+-----------+----------+--------------+ PERO     Partial                                      Acute          +---------+---------------+---------+-----------+----------+--------------+ Soleal   None                                         Acute          +---------+---------------+---------+-----------+----------+--------------+ Gastroc  None                                         Acute          +---------+---------------+---------+-----------+----------+--------------+   +----+---------------+---------+-----------+----------+--------------+ LEFTCompressibilityPhasicitySpontaneityPropertiesThrombus Aging +----+---------------+---------+-----------+----------+--------------+ CFV Full           Yes      Yes                                 +----+---------------+---------+-----------+----------+--------------+     Summary: Right: Findings consistent with acute deep vein thrombosis involving the right femoral vein, right popliteal vein, right posterior tibial veins, right peroneal veins, right soleal veins, and right gastrocnemius veins. No cystic structure found in the popliteal fossa. Left: No evidence of common femoral vein obstruction.  *See table(s) above for measurements and observations.     Preliminary     Pending Labs Unresulted Labs (From admission, onward)    Start     Ordered   05/09/19 0500  CBC  Daily,   R     05/08/19 1540   05/08/19 2230  Heparin level (unfractionated)  Once-Timed,   STAT     05/08/19 1617   05/08/19 1547  Blood culture (routine x 2)  BLOOD CULTURE X 2,   STAT     05/08/19 1546   05/08/19 1524  Urine culture  ONCE - STAT,   STAT     05/08/19 1523   Signed and Held  Magnesium  Tomorrow morning,   R    Comments: Call MD if <1.5    Signed and Held   Signed and Held  Phosphorus  Tomorrow morning,   R     Signed and Held   Signed and Held  TSH  Once,   R    Comments: Cancel if already done within 1 month and notify MD    Signed and Held   Signed and Held  Comprehensive metabolic panel  Once,   R    Comments: Cal MD for K<3.5 or >5.0    Signed and Held   Signed and Held  CBC  Once,   R    Comments: Call for hg <8.0    Signed and Held   Signed and Held  Lactic acid, plasma  STAT Now then every 3 hours,   STAT     Signed and Held   Signed and Held  Procalcitonin  Add-on,   R     Signed and Held   Signed and Held  Prealbumin  Tomorrow morning,   R     Signed and Held          Vitals/Pain Today's Vitals   05/08/19 1833 05/08/19 1909 05/08/19 1930 05/08/19 2030  BP:  (!) 143/111 (!) 150/105 (!) 149/127  Pulse:   (!) 117 (!) 122  Resp:   13 20  Temp:      TempSrc:      SpO2:   99% 97%  Weight:      Height:      PainSc: 6        Isolation Precautions No active isolations  Medications Medications  heparin ADULT infusion 100 units/mL (25000 units/222mL sodium chloride 0.45%) (1,150 Units/hr Intravenous Restarted 05/08/19 1729)  ceFEPIme (MAXIPIME) 2 g in sodium chloride 0.9 % 100 mL IVPB (has no administration in time range)  sodium chloride 0.9 % bolus 1,000 mL (0 mLs Intravenous Stopped 05/08/19 1538)  fentaNYL (SUBLIMAZE) injection 50 mcg (50 mcg Intravenous Given 05/08/19 1400)  sodium chloride 0.9 % bolus 1,000 mL (0 mLs  Intravenous Stopped 05/08/19 1819)  heparin bolus via infusion 2,000 Units (2,000 Units Intravenous Bolus from Bag 05/08/19 1607)  acetaminophen (TYLENOL) tablet 1,000 mg (1,000 mg Oral Given 05/08/19 1604)  iohexol (OMNIPAQUE) 350 MG/ML injection 100 mL (100 mLs Intravenous Contrast Given 05/08/19 1631)  cefTRIAXone (ROCEPHIN) 1 g in sodium chloride 0.9 % 100 mL IVPB (0 g Intravenous Stopped 05/08/19 1819)  ondansetron (ZOFRAN) injection 4 mg (4 mg Intravenous Given 05/08/19 1904)    Mobility walks with device

## 2019-05-08 NOTE — Progress Notes (Signed)
Patient [facility]   Progress Note: General Provider: [name], FNP  SUBJECTIVE:   [name] [name] is a 23 y.o. female who  has a past medical history of Gout, Hypertension, Lupus ([place]), and Raynaud disease.. Patient presents today for Follow-up (4 week follow up for lupus ) Since the last visit, patient was seen by Dr. Marrian Salvage [facility] Rheum on 04/30/2019. She has a follow up scheduled for 05/23/2019. She was referred to pain management and also instructed to have a repeat echocardiogram. Her appointment with pain management is 05/24/2019.  She is been recommended to taper prednisone by 10 mg every day and go down up to 10 mg daily as tolerated. Her baseline dose is 5 mg daily. Patient has been prescribed Prograft for lupus nephritis through nephrology. Dr. Franchot Gallo in nephrology suggests that treatment with iv Cyclophosphamide (instead of Cellcept/Prograf which the pt has failed to follow with) is warranted.    Patient mentions decaying of the teeth and she was instructed to follow up with a dentist.     Review of Systems  Constitutional: Positive for malaise/fatigue.  Cardiovascular: Positive for leg swelling.  Musculoskeletal: Positive for joint pain (Right leg with swelling).  Neurological: Positive for weakness.  All other systems reviewed and are negative.    OBJECTIVE: BP (!) 138/95 (BP Location: Right Arm, Patient Position: Sitting, Cuff Size: Normal)   Pulse (!) 120   Temp 99.7 F (37.6 C) (Oral)   Resp 18   Wt Readings from Last 3 Encounters:  05/08/19 140 lb (63.5 kg)  04/19/19 116 lb (52.6 kg)  04/10/19 116 lb (52.6 kg)     Physical Exam Vitals signs and nursing note reviewed.  Constitutional:      General: She is not in acute distress.    Appearance: Normal appearance. She is ill-appearing.  HENT:     Head: Normocephalic and atraumatic.  Eyes:     Extraocular Movements: Extraocular movements intact.   Conjunctiva/sclera: Conjunctivae normal.     Pupils: Pupils are equal, round, and reactive to light.  Cardiovascular:     Rate and Rhythm: Normal rate and regular rhythm.     Heart sounds: No murmur.  Pulmonary:     Effort: Pulmonary effort is normal.     Breath sounds: Normal breath sounds.  Musculoskeletal: Normal range of motion.     Right lower leg: She exhibits tenderness and swelling.  Skin:    General: Skin is warm and dry.  Neurological:     Mental Status: She is alert and oriented to person, place, and time.  Psychiatric:        Mood and Affect: Mood normal.        Behavior: Behavior normal.        Thought Content: Thought content normal.        Judgment: Judgment normal.     Comments: Patient is crying throughout the encounter.      ASSESSMENT/PLAN:  1. Systemic lupus erythematosus, unspecified SLE type, unspecified organ involvement status ([place]) Patient followed by rheum and nephrology for these conditions.   2. Other depression She reports being instructed to stop taking cymbalta. Will refer to psych for further evaluation and treatment.   3. Sinus tachycardia Patient reports having severe pain. Tachycardia present today. Will continue to monitor.   4. Right calf pain Due to patient's severe pain and crying, advised patient to go to the ED for further evaluation.  No follow-ups on file.  The patient was given clear instructions to go to ER or return to medical center if symptoms do not improve, worsen or new problems develop. The patient verbalized understanding and agreed with plan of care.   [name], FNP-BC Patient [facility] [address] [place], [place] [address] [phone]

## 2019-05-08 NOTE — ED Triage Notes (Addendum)
Pt states she is having right leg and shoulder pain. Pt states that it feels like an intense cramp, but won't go away. Pt has had pain for 4 days. Pt states initially, she thought it was her lupus, but feels different. Denies injury/trauma.

## 2019-05-08 NOTE — Progress Notes (Signed)
ANTICOAGULATION CONSULT NOTE - Initial Consult  Pharmacy Consult for heparin Indication:  RLE DVT, r/o PE  Allergies  Allergen Reactions  . Hydroxychloroquine Rash  . Latex Rash    Patient Measurements: Height: 5\' 3"  (160 cm) Weight: 140 lb (63.5 kg) IBW/kg (Calculated) : 52.4 Heparin Dosing Weight: 63.5 kg  Vital Signs: Temp: 99.9 F (37.7 C) (08/26 1143) Temp Source: Oral (08/26 1143) BP: 150/112 (08/26 1355) Pulse Rate: 129 (08/26 1355)  Labs: Recent Labs    05/08/19 1329  HGB 10.2*  HCT 33.0*  PLT 190  CREATININE 0.76  CKTOTAL 35*    Estimated Creatinine Clearance: 97.2 mL/min (by C-G formula based on SCr of 0.76 mg/dL).   Medical History: Past Medical History:  Diagnosis Date  . Gout   . Hypertension   . Lupus ([place])   . Raynaud disease     Medications: no anticoagulants PTA  Assessment: 23 yo F to start heparin per pharmacy for r/o PE/DVT.  Hg 10.2. PTLC 190. SCr 0.76. Wt 63.5 kg; D dimer > 20.  Doppler + for RLE DVT. CT angio ordered  Goal of Therapy:  Heparin level 0.3-0.7 units/ml Monitor platelets by anticoagulation protocol: Yes   Plan:  Give 2000 units bolus x 1 Start heparin infusion at 1150 units/hr Check anti-Xa level in 6 hours and daily while on heparin Continue to monitor H&H and platelets  Eudelia Bunch, Pharm.D [phone] 05/08/2019 3:37 PM

## 2019-05-08 NOTE — ED Notes (Addendum)
CT informed this RN that IV infiltrated towards the end of CT. MD made aware. MD will attempt [place] IV.

## 2019-05-08 NOTE — ED Provider Notes (Signed)
[facility] DEPT Provider Note   CSN: [id number] Arrival date & time: 05/08/19  1132     History   Chief Complaint Chief Complaint  Patient presents with  . Arm Pain  . Leg Pain    HPI [name] [name] is a 23 y.o. female.     The history is provided by the patient and medical records. No language interpreter was used.   [name] [name] is a 23 y.o. female  with a PMH as listed below including lupus, gout, hypertension, Raynaud's disease who presents to the Emergency Department complaining of right upper and lower extremity pain.  Patient states that initially, about 4 days ago, she was having pain around her right knee.  She thought that it was her lupus flaring up, therefore used compression and elevation as she has in the past, but it was not improving.  She then started to have a sharp, constant pain from the knee all the way down her foot.  This morning, she started having pain to her right arm as well.  It hurts so badly, and is so tight, that she does not feel like she can range either her right upper or lower extremity without significant amount of pain.  Reports that her calf feels very hard.  Initially did report numbness to her right upper and lower extremity to me as well, but later told attending that it was more a sharp pain, not a numbness.  Denies any history of similar sxs. Does report shortness of breath over the last couple of days as well.   Past Medical History:  Diagnosis Date  . Gout   . Hypertension   . Lupus ([place])   . Raynaud disease     Patient Active Problem List   Diagnosis Date Noted  . Dysuria 04/19/2019  . Facial edema   . Rash due to systemic lupus erythematosus (SLE) ([place])   . Immunosuppressed status ([place])   . Acute lower UTI   . 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 Lupus (systemic lupus erythematosus) ([place]) 03/19/2019  . Bronchitis 03/19/2019  . Essential  hypertension 03/19/2019  . Elevated AST (SGOT) 03/19/2019  . Dehydration 03/19/2019  . Sinus tachycardia 03/19/2019  . Metabolic acidosis 0000000  . Oral thrush 03/19/2019  . Relative acute adrenal insufficiency ([place]) 06/11/2018  . Anemia 06/09/2018  . Lobar pneumonia ([place]) 06/07/2018  . Pleuritic chest pain 06/05/2018  . Dysphagia 06/01/2018  . Odynophagia 06/01/2018  . Thrush 06/01/2018  . Mucositis oral 01/12/2018  . Vaginal candidiasis 01/12/2018  . Methotrexate toxicity   . Mouth ulcers   . Leukopenia 01/10/2018  . Accidental methotrexate overdose 01/08/2018  . Depression 02/08/2017  . Primary osteoarthritis of both knees 11/09/2016  . Chronic midline low back pain without sciatica 11/09/2016  . Vitamin D deficiency 11/09/2016  . SLE (systemic lupus erythematosus) ([place]) 09/19/2016  . Insomnia 01/29/2016  . Depo-Provera contraceptive status 08/11/2015  . Raynaud's disease 08/11/2015  . Seasonal allergies 08/11/2015  . IDA (iron deficiency anemia) 08/11/2015    Past Surgical History:  Procedure Laterality Date  . ORIF SHOULDER FRACTURE Left   . TONSILLECTOMY       OB History   No obstetric history on file.      Home Medications    Prior to Admission medications   Medication Sig Start Date End Date Taking? Authorizing Provider  amLODipine (NORVASC) 10 MG tablet Take 1 tablet (10 mg total) by  mouth daily. 03/26/19   Kayleen Memos, DO  buPROPion (WELLBUTRIN XL) 150 MG 24 hr tablet Take 1 tablet (150 mg total) by mouth daily. 03/26/19   Kayleen Memos, DO  cyclobenzaprine (FLEXERIL) 10 MG tablet Take 1 tablet (10 mg total) by mouth 2 (two) times daily as needed for muscle spasms. 03/25/19   Kayleen Memos, DO  diphenhydrAMINE (BENADRYL) 25 MG tablet Take 25 mg by mouth every 6 (six) hours as needed for allergies.    [provider]  DULoxetine (CYMBALTA) 30 MG capsule TAKE 1 CAPSULE BY MOUTH DAILY X 2 WEEKS THEN INCREASE TO 2 CAPSULES BY MOUTH DAILY  THEREAFTER 04/30/19   [name], FNP  DULoxetine (CYMBALTA) 60 MG capsule Take 1 capsule (60 mg total) by mouth daily. 03/26/19   Kayleen Memos, DO  ferrous sulfate 325 (65 FE) MG tablet Take 1 tablet (325 mg total) by mouth 2 (two) times daily. With 1/2 glass of orange juice 12/15/15   Davonna Belling, MD  folic acid (FOLVITE) 1 MG tablet Take 1 tablet (1 mg total) by mouth at bedtime. Patient taking differently: Take 1 mg by mouth 2 (two) times daily.  03/25/19   Kayleen Memos, DO  gabapentin (NEURONTIN) 300 MG capsule Take 1 capsule (300 mg total) by mouth 3 (three) times daily. 04/10/19   [name], FNP  loperamide (IMODIUM) 2 MG capsule Take 1 capsule (2 mg total) by mouth as needed for diarrhea or loose stools. 03/25/19   Kayleen Memos, DO  medroxyPROGESTERone (DEPO-PROVERA) 150 MG/ML injection Inject 150 mg into the muscle every 3 (three) months.    [provider]  metoprolol tartrate (LOPRESSOR) 25 MG tablet Take 0.5 tablets (12.5 mg total) by mouth 2 (two) times daily. 03/25/19   Kayleen Memos, DO  mupirocin ointment (BACTROBAN) 2 % Place 1 application into the nose 2 (two) times daily. 04/10/19   [name], FNP  nystatin (MYCOSTATIN) 100000 UNIT/ML suspension Take 5 mLs (500,000 Units total) by mouth 2 (two) times daily as needed. Patient taking differently: Take 5 mLs by mouth 2 (two) times daily as needed (thrush).  03/25/19   Kayleen Memos, DO  pantoprazole (PROTONIX) 20 MG tablet Take 1 tablet (20 mg total) by mouth at bedtime. 03/25/19   Kayleen Memos, DO  predniSONE (DELTASONE) 10 MG tablet Prednisone 50 mg po daily x 7 days then Prednisone 40 mg po daily x 7 days then Prednisone 30 mg po daily x 7 days then Prednisone 20 mg daily 04/22/19   Oswald Hillock, MD  Vitamin D, Ergocalciferol, (DRISDOL) 50000 units CAPS capsule Take 1 capsule (50,000 Units total) by mouth 2 (two) times a week. 12/21/17   Bo Merino, MD    Family History Family History   Problem Relation Age of Onset  . Aneurysm Mother   . Stroke Sister   . Seizures Sister   . Lung cancer Maternal Aunt   . Heart attack Paternal Uncle   . Colon cancer Neg Hx   . Esophageal cancer Neg Hx   . Pancreatic cancer Neg Hx   . Stomach cancer Neg Hx     Social History Social History   Tobacco Use  . Smoking status: Never Smoker  . Smokeless tobacco: Never Used  Substance Use Topics  . Alcohol use: No  . Drug use: No     Allergies   Hydroxychloroquine and Latex   Review of Systems Review of Systems  Constitutional: Negative  for fever.  HENT: Negative for congestion.   Respiratory: Positive for shortness of breath. Negative for cough and wheezing.   Musculoskeletal: Positive for myalgias.  Neurological: Positive for numbness.  All other systems reviewed and are negative.    Physical Exam Updated Vital Signs BP (!) 144/100   Pulse (!) 135   Temp 99.9 F (37.7 C) (Oral)   Resp 16   Ht 5\' 3"  (1.6 m)   Wt 63.5 kg   SpO2 100%   BMI 24.80 kg/m   Physical Exam Vitals signs and nursing note reviewed.  Constitutional:      General: She is not in acute distress.    Appearance: She is well-developed.  HENT:     Head: Normocephalic and atraumatic.  Neck:     Musculoskeletal: Neck supple.  Cardiovascular:     Heart sounds: Normal heart sounds. No murmur.     Comments: Tachycardic, but regular. Pulmonary:     Effort: Pulmonary effort is normal. No respiratory distress.     Breath sounds: Normal breath sounds.  Abdominal:     General: There is no distension.     Palpations: Abdomen is soft.     Tenderness: There is no abdominal tenderness.  Musculoskeletal:     Comments: Exquisitely tender to the right calf which is slightly more firm than left. No overlying skin changes noted. Intact pulses x4. No significant midline C/T/L spine tenderness.  Skin:    General: Skin is warm and dry.  Neurological:     Mental Status: She is alert and oriented to  person, place, and time.     Comments: Diminished sensation to right upper and right lower extremity diffusely when compared to the left.      ED Treatments / Results  Labs (all labs ordered are listed, but only abnormal results are displayed) Labs Reviewed  CBC WITH DIFFERENTIAL/PLATELET  COMPREHENSIVE METABOLIC PANEL  CK  D-DIMER, QUANTITATIVE (NOT AT [facility])    EKG EKG Interpretation  Date/Time:  Wednesday May 08 2019 13:18:07 EDT Ventricular Rate:  128 PR Interval:    QRS Duration: 64 QT Interval:  333 QTC Calculation: 486 R Axis:   64 Text Interpretation:  Sinus tachycardia Borderline prolonged QT interval Confirmed by Quintella Reichert [phone]) on 05/08/2019 1:27:25 PM   Radiology No results found.  Procedures Procedures (including critical care time)  Medications Ordered in ED Medications  sodium chloride 0.9 % bolus 1,000 mL (has no administration in time range)  fentaNYL (SUBLIMAZE) injection 50 mcg (has no administration in time range)     Initial Impression / Assessment and Plan / ED Course  I have reviewed the triage vital signs and the nursing notes.  Pertinent labs & imaging results that were available during my care of the patient were reviewed by me and considered in my medical decision making (see chart for details).       [name] [name] is a 23 y.o. female with PMH of lupus who presents to ED for right lower extremity pain x4 days with right upper extremity pain beginning this morning.  Does not feel consistent with her typical lupus flares.  Initial eval with temperature of 99.9 and heart rate in the 120s.  Good pulses throughout.  Does have subjective diminished sensation to the right upper and lower extremities when compared to left.  Seen by attending, Dr. Ralene Bathe as well at the beginning of work-up.  We discussed the case following and developed work-up plan: Labs including CK and d-dimer,  chest x-ray, right lower extremity ultrasound.  We will also  give pain medication and fluids then reevaluate.  D-dimer > 20. Added CT angio to work-up. Remainder of labs reassuring.   Patient re-evaluated. Still tachycardic in the 120's. She appears more comfortable and does note improvement in her pain.   Imaging pending at shift change. Care assumed by oncoming provider, Dr. Darl Householder. Case discussed, plan agreed upon.  Patient seen by and discussed with Dr. Ralene Bathe who agrees with treatment plan.   Final Clinical Impressions(s) / ED Diagnoses   Final diagnoses:  None    ED Discharge Orders    None       Ward, Ozella Almond, PA-C 05/08/19 1501    Quintella Reichert, MD 05/09/19 [phone]

## 2019-05-08 NOTE — ED Notes (Signed)
Patient transported back to CT ?

## 2019-05-08 NOTE — ED Notes (Signed)
Pt right leg elevated to to highest tolerable position.

## 2019-05-08 NOTE — Progress Notes (Signed)
ANTICOAGULATION CONSULT NOTE - Follow Up Consult  Pharmacy Consult for Heparin Indication: DVT  Allergies  Allergen Reactions  . Hydroxychloroquine Rash  . Latex Rash    Patient Measurements: Height: 5\' 3"  (160 cm) Weight: 125 lb 14.1 oz (57.1 kg) IBW/kg (Calculated) : 52.4 Heparin Dosing Weight:   Vital Signs: Temp: 97.9 F (36.6 C) (08/26 2121) Temp Source: Oral (08/26 2121) BP: 129/77 (08/26 2309) Pulse Rate: 117 (08/26 2129)  Labs: Recent Labs    05/08/19 1329 05/08/19 2207  HGB 10.2*  --   HCT 33.0*  --   PLT 190  --   HEPARINUNFRC  --  0.11*  CREATININE 0.76  --   CKTOTAL 35*  --   TROPONINIHS 4  --     Estimated Creatinine Clearance: 89.7 mL/min (by C-G formula based on SCr of 0.76 mg/dL).   Medications:  Infusions:  . sodium chloride Stopped (05/08/19 2238)  . ceFEPime (MAXIPIME) IV Stopped (05/08/19 2308)  . heparin 1,150 Units/hr (05/08/19 2300)    Assessment: Patient with low heparin level.  RN noted IV issues while in CT but heparin appears to have been running without issue since 1800 8/26.    Goal of Therapy:  Heparin level 0.3-0.7 units/ml Monitor platelets by anticoagulation protocol: Yes   Plan:  Increase heparin to 1300 units/hr Recheck level at 0800  Nani Skillern Crowford 05/08/2019,11:56 PM

## 2019-05-08 NOTE — H&P (Signed)
[name] [name] GHW:[id number] DOB: 10-22-1995 DOA: 05/08/2019     PCP: [name], FNP   Outpatient Specialists:     NEphrology:     Dr. Franchot Gallo    Rheumatology [place], [place] Patient arrived to ER on 05/08/19 at 1132  Patient coming from: home Lives   With family sisters    Chief Complaint:   Chief Complaint  Patient presents with   Arm Pain   Leg Pain    HPI: [name] [name] is a 23 y.o. female with medical history significant of lupus nephritis, h/oRaynaud's phenomenon, HTN, gout    Presented with severe right calf pain Patient presented to her regular provider today complaining of severe calf pain for the past 4 days and was crying in the office.  She was sent to emergency department for further evaluation. Initially she also endorsed some numbness and tingling in her upper and lower extremity but then she said no it was more of a sharp pain not numbness  Was just admitted on 7th of August  With UTI resulting in Sepsis, and itchy rash she stopped taking Cellcept in the begining of August bc it messed up her teeth. She was noted to have tachycardia,  She was admitted Rhuematologist was consulted and she was treated with Prednisone. Urine culture was negative but she was treated with IV antibiotics  Her Lupus nephritis - she was restarted on Cellcept and Prograf She was set up with pain managemnt clinic appointment on  05/24/2019. Plan was for patient to have repeat echogram as an outpatient  She is been recommended to taper prednisone by 10 mg every day and go down up to 10 mg daily as tolerated. Starting from Prednisone 50 mg on 04/22/2019  Her baseline dose is 5 mg daily. She is unsure of her dose of prednisone but thinks its currently 50 mg still although she should have been coming down by now.  Her dose should be  40 mg  Patient has been prescribed Prograft for lupus nephritis through nephrology. Dr. Franchot Gallo in nephrology suggests that treatment with iv  Cyclophosphamide (instead of Cellcept/Prograf which the pt has failed to follow with) is warranted.    She has recurrent sinus tachycardia Dates she has not been taking her CellCept She reports she  still takes Prograf although unsure of the dose. And it is not on her current medication list Ports have not had her dose of Lopressor this morning.  States she has chronic shortness of breath which is not any worse than her baseline.  Denies any chest pain   She has been taking off of her antidepression medications as well  Infectious risk factors:   Reports fever, shortness of breath,  In  ER RAPID COVID TEST NEGATIVE    Regarding pertinent Chronic problems:   lupus nephritis on cellcept/prograft, h/oRaynaud's phenomenon on norvasc,     HTN on lopressor, last echo sep 2019 LV EF: 55% -   60% Left ventricular diastolic function   parameters were normal.      While in ER: Found to have right lower extremity DVT CT chest showed no PE   large clot burden in the leg.  Started on Heparin   Worrisome for possible UTI and patient was started on Rocephin She was noted to have low-grade fever while in the emergency department and consistently tachycardic up to 125/130  The following Work up has been ordered so far:  Orders Placed This Encounter  Procedures  Urine culture   SARS Coronavirus 2 [facility] order, Performed in [facility] hospital lab) Nasopharyngeal Nasopharyngeal Swab   Blood culture (routine x 2)   DG Chest Portable 1 View   CT Angio Chest PE W and/or Wo Contrast   CBC with Differential   Comprehensive metabolic panel   CK   D-dimer, quantitative (not at [facility])   Urinalysis, Routine w reflex microscopic   CBC   Heparin level (unfractionated)   Check Rectal Temperature   Cardiac monitoring   heparin per pharmacy consult   Consult to hospitalist  ALL PATIENTS BEING ADMITTED/HAVING PROCEDURES NEED COVID-19 SCREENING   ED EKG   EKG 12-Lead    Admit to Inpatient (patient's expected length of stay will be greater than 2 midnights or inpatient only procedure)   VAS [place] LOWER EXTREMITY VENOUS (DVT) (ONLY [facility] & [facility])    Following Medications were ordered in ER: Medications  heparin ADULT infusion 100 units/mL (25000 units/238m sodium chloride 0.45%) (1,150 Units/hr Intravenous Restarted 05/08/19 1729)  ondansetron (ZOFRAN) injection 4 mg (has no administration in time range)  sodium chloride 0.9 % bolus 1,000 mL (0 mLs Intravenous Stopped 05/08/19 1538)  fentaNYL (SUBLIMAZE) injection 50 mcg (50 mcg Intravenous Given 05/08/19 1400)  sodium chloride 0.9 % bolus 1,000 mL (0 mLs Intravenous Stopped 05/08/19 1819)  heparin bolus via infusion 2,000 Units (2,000 Units Intravenous Bolus from Bag 05/08/19 1607)  acetaminophen (TYLENOL) tablet 1,000 mg (1,000 mg Oral Given 05/08/19 1604)  iohexol (OMNIPAQUE) 350 MG/ML injection 100 mL (100 mLs Intravenous Contrast Given 05/08/19 1631)  cefTRIAXone (ROCEPHIN) 1 g in sodium chloride 0.9 % 100 mL IVPB (0 g Intravenous Stopped 05/08/19 1819)        Consult Orders  (From admission, onward)         Start     Ordered   05/08/19 1752  Consult to hospitalist  ALL PATIENTS BEING ADMITTED/HAVING PROCEDURES NEED COVID-19 SCREENING  Once    Comments: ALL PATIENTS BEING ADMITTED/HAVING PROCEDURES NEED COVID-19 SCREENING  Provider:  (Not yet assigned)  Question Answer Comment  Place call to: [facility]   Reason for Consult Admit      05/08/19 1751           Significant initial  Findings: Abnormal Labs Reviewed  CBC WITH DIFFERENTIAL/PLATELET - Abnormal; Notable for the following components:      Result Value   RBC 3.56 (*)    Hemoglobin 10.2 (*)    HCT 33.0 (*)    RDW 16.0 (*)    nRBC 0.4 (*)    Lymphs Abs 0.6 (*)    Abs Immature Granulocytes 0.09 (*)    All other components within normal limits  COMPREHENSIVE METABOLIC PANEL - Abnormal; Notable for the following components:   Sodium  134 (*)    CO2 20 (*)    Calcium 8.5 (*)    Albumin 2.6 (*)    All other components within normal limits  CK - Abnormal; Notable for the following components:   Total CK 35 (*)    All other components within normal limits  D-D[facility]ANTITAT[facility] - Abnormal; Notable for the following components:   D-Dimer, Quant >20.00 (*)    All other components within normal limits  URINALYSIS, ROUTINE W REFLEX MICROSCOPIC - Abnormal; Notable for the following components:   APPearance HAZY (*)    Hgb urine dipstick SMALL (*)    Protein, ur >=300 (*)    Leukocytes,Ua MODERATE (*)  Bacteria, UA FEW (*)    All other components within normal limits    Otherwise labs showing:    Recent Labs  Lab 05/08/19 1329  NA 134*  K 4.1  CO2 20*  GLUCOSE 89  BUN 15  CREATININE 0.76  CALCIUM 8.5*    Cr   stable,   Lab Results  Component Value Date   CREATININE 0.76 05/08/2019   CREATININE 0.58 04/21/2019   CREATININE 0.70 04/20/2019    Recent Labs  Lab 05/08/19 1329  AST 33  ALT 20  ALKPHOS 106  BILITOT 0.4  PROT 7.4  ALBUMIN 2.6*   Lab Results  Component Value Date   CALCIUM 8.5 (L) 05/08/2019   PHOS 3.3 03/24/2019      WBC        Component Value Date/Time   WBC 5.6 05/08/2019 1329   ANC    Component Value Date/Time   NEUTROABS 4.5 05/08/2019 1329   NEUTROABS 5.7 03/28/2018 1042   ALC No results found for: LYMPHOABS    Plt: Lab Results  Component Value Date   PLT 190 05/08/2019     Lactic Acid, Venous    Component Value Date/Time   LATICACIDVEN 1.1 05/08/2019 1547    Procalcitonin ordered   COVID-19 Labs  Recent Labs    05/08/19 1329  DDIMER >20.00*    Lab Results  Component Value Date   SARSCOV2NAA NEGATIVE 05/08/2019   SARSCOV2NAA NEGATIVE 04/19/2019   [place] NEGATIVE 03/19/2019    HG/HCT  stable,      Component Value Date/Time   HGB 10.2 (L) 05/08/2019 1329   HGB 10.7 (L) 03/28/2018 1042   HCT 33.0 (L) 05/08/2019 1329     HCT 34.3 03/28/2018 1042   Cardiac Panel (last 3 results) Recent Labs    05/08/19 1329  CKTOTAL 35*       BNP (last 3 results) No results for input(s): BNP in the last 8760 hours.  ProBNP (last 3 results) No results for input(s): PROBNP in the last 8760 hours.  DM  labs:  HbA1C: Recent Labs    03/20/19 0500  HGBA1C 4.5*       CBG (last 3)  No results for input(s): GLUCAP in the last 72 hours.     UA possible UTI      Urine analysis:    Component Value Date/Time   COLORURINE YELLOW 05/08/2019 1523   APPEARANCEUR HAZY (A) 05/08/2019 1523   LABSPEC 1.009 05/08/2019 1523   PHURINE 7.0 05/08/2019 1523   GLUCOSEU NEGATIVE 05/08/2019 1523   HGBUR SMALL (A) 05/08/2019 1523   BILIRUBINUR NEGATIVE 05/08/2019 1523   BILIRUBINUR small 08/30/2018 0953   KETONESUR NEGATIVE 05/08/2019 1523   PROTEINUR >=300 (A) 05/08/2019 1523   UROBILINOGEN 0.2 08/30/2018 0953   NITRITE NEGATIVE 05/08/2019 1523   LEUKOCYTESUR MODERATE (A) 05/08/2019 1523    Doppler Right: Findings consistent with acute deep vein thrombosis involving the right femoral vein, right popliteal vein, right posterior tibial veins, right peroneal veins, right soleal veins, and right gastrocnemius veins. No cystic structure found in the  popliteal fossa. Left: No evidence of common femoral vein obstruction.    CXR -  NON acute   CTA chest -  nonacute, no PE, chronic cardiomegaly   ECG:  Personally reviewed by me showing: HR : 128 Rhythm:  Sinus tachycardia     no evidence of ischemic changes QTC 486      ED Triage Vitals  Enc Vitals Group  BP 05/08/19 1143 (!) 136/106     Pulse Rate 05/08/19 1144 (!) 135     Resp 05/08/19 1143 16     Temp 05/08/19 1143 99.9 F (37.7 C)     Temp Source 05/08/19 1143 Oral     SpO2 05/08/19 1144 100 %     Weight 05/08/19 1145 140 lb (63.5 kg)     Height 05/08/19 1145 '5\' 3"'  (1.6 m)     Head Circumference --      Peak Flow --      Pain Score 05/08/19 1145 10      Pain Loc --      Pain Edu? --      Excl. in [place]? --   TMAX(24)@       Latest  Blood pressure (!) 150/112, pulse (!) 120, temperature 100.2 F (37.9 C), temperature source Rectal, resp. rate 16, height '5\' 3"'  (1.6 m), weight 63.5 kg, SpO2 96 %.     Hospitalist was called for admission for DVT, sepsis    Review of Systems:    Pertinent positives include: chills, fatigue,  lower extremity swelling    Constitutional:  No weight loss, night sweats, Fevers,  weight loss  HEENT:  No headaches, Difficulty swallowing,Tooth/dental problems,Sore throat,  No sneezing, itching, ear ache, nasal congestion, post nasal drip,  Cardio-vascular:  No chest pain, Orthopnea, PND, anasarca, dizziness, palpitations.no BilateralGI:  No heartburn, indigestion, abdominal pain, nausea, vomiting, diarrhea, change in bowel habits, loss of appetite, melena, blood in stool, hematemesis Resp:  no shortness of breath at rest. No dyspnea on exertion, No excess mucus, no productive cough, No non-productive cough, No coughing up of blood.No change in color of mucus.No wheezing. Skin:  no rash or lesions. No jaundice GU:  no dysuria, change in color of urine, no urgency or frequency. No straining to urinate.  No flank pain.  Musculoskeletal:  No joint pain or no joint swelling. No decreased range of motion. No back pain.  Psych:  No change in mood or affect. No depression or anxiety. No memory loss.  Neuro: no localizing neurological complaints, no tingling, no weakness, no double vision, no gait abnormality, no slurred speech, no confusion  All systems reviewed and apart from [place] all are negative  Past Medical History:   Past Medical History:  Diagnosis Date   Gout    Hypertension    Lupus ([place])    Raynaud disease       Past Surgical History:  Procedure Laterality Date   ORIF SHOULDER FRACTURE Left    TONSILLECTOMY      Social History:  Ambulatory  Gilford Rile      reports that she has  never smoked. She has never used smokeless tobacco. She reports that she does not drink alcohol or use drugs.  Family History:   Family History  Problem Relation Age of Onset   Aneurysm Mother    Stroke Sister    Seizures Sister    Lung cancer Maternal Aunt    Heart attack Paternal Uncle    Colon cancer Neg Hx    Esophageal cancer Neg Hx    Pancreatic cancer Neg Hx    Stomach cancer Neg Hx     Allergies: Allergies  Allergen Reactions   Hydroxychloroquine Rash   Latex Rash     Prior to Admission medications   Medication Sig Start Date End Date Taking? Authorizing Provider  amLODipine (NORVASC) 10 MG tablet Take 1 tablet (10 mg total) by mouth daily.  03/26/19  Yes Hall, Carole N, DO  buPROPion (WELLBUTRIN XL) 150 MG 24 hr tablet Take 1 tablet (150 mg total) by mouth daily. 03/26/19  Yes Kayleen Memos, DO  cyclobenzaprine (FLEXERIL) 10 MG tablet Take 1 tablet (10 mg total) by mouth 2 (two) times daily as needed for muscle spasms. 03/25/19  Yes Kayleen Memos, DO  diphenhydrAMINE (BENADRYL) 25 MG tablet Take 25 mg by mouth every 6 (six) hours as needed for allergies.   Yes [provider]  ferrous sulfate 325 (65 FE) MG tablet Take 1 tablet (325 mg total) by mouth 2 (two) times daily. With 1/2 glass of orange juice 12/15/15  Yes Davonna Belling, MD  folic acid (FOLVITE) 1 MG tablet Take 1 tablet (1 mg total) by mouth at bedtime. Patient taking differently: Take 1 mg by mouth 2 (two) times daily.  03/25/19  Yes Irene Pap N, DO  gabapentin (NEURONTIN) 300 MG capsule Take 1 capsule (300 mg total) by mouth 3 (three) times daily. 04/10/19  Yes [name], FNP  loperamide (IMODIUM) 2 MG capsule Take 1 capsule (2 mg total) by mouth as needed for diarrhea or loose stools. 03/25/19  Yes Kayleen Memos, DO  medroxyPROGESTERone (DEPO-PROVERA) 150 MG/ML injection Inject 150 mg into the muscle every 3 (three) months.   Yes [provider]  metoprolol tartrate  (LOPRESSOR) 25 MG tablet Take 0.5 tablets (12.5 mg total) by mouth 2 (two) times daily. 03/25/19  Yes Kayleen Memos, DO  mupirocin ointment (BACTROBAN) 2 % Place 1 application into the nose 2 (two) times daily. 04/10/19  Yes [name], FNP  nystatin (MYCOSTATIN) 100000 UNIT/ML suspension Take 5 mLs (500,000 Units total) by mouth 2 (two) times daily as needed. Patient taking differently: Take 5 mLs by mouth 2 (two) times daily as needed (thrush).  03/25/19  Yes Hall, Carole N, DO  pantoprazole (PROTONIX) 20 MG tablet Take 1 tablet (20 mg total) by mouth at bedtime. 03/25/19  Yes Kayleen Memos, DO  predniSONE (DELTASONE) 10 MG tablet Prednisone 50 mg po daily x 7 days then Prednisone 40 mg po daily x 7 days then Prednisone 30 mg po daily x 7 days then Prednisone 20 mg daily 04/22/19  Yes Lama, Marge Duncans, MD  Vitamin D, Ergocalciferol, (DRISDOL) 50000 units CAPS capsule Take 1 capsule (50,000 Units total) by mouth 2 (two) times a week. 12/21/17  Yes Deveshwar, Abel Presto, MD  DULoxetine (CYMBALTA) 30 MG capsule TAKE 1 CAPSULE BY MOUTH DAILY X 2 WEEKS THEN INCREASE TO 2 CAPSULES BY MOUTH DAILY THEREAFTER Patient not taking: Reported on 05/08/2019 04/30/19   [name], FNP  DULoxetine (CYMBALTA) 60 MG capsule Take 1 capsule (60 mg total) by mouth daily. Patient not taking: Reported on 05/08/2019 03/26/19   Kayleen Memos, DO   Physical Exam: Blood pressure (!) 150/112, pulse (!) 120, temperature 100.2 F (37.9 C), temperature source Rectal, resp. rate 16, height '5\' 3"'  (1.6 m), weight 63.5 kg, SpO2 96 %. 1. General:  in No Acute distress   Chronically ill -appearing 2. Psychological: Alert and Oriented 3. Head/ENT:   Dry Mucous Membranes                          Head Non traumatic, neck supple                          Poor Dentition 4. SKIN:  decreased Skin  turgor,  Skin clean Dry and intact no rash 5. Heart: Regular rate and rhythm no Murmur, no Rub or gallop 6. Lungs:  no wheezes or crackles   7.  Abdomen: Soft,  non-tender, Non distended   bowel sounds present 8. Lower extremities: no clubbing, cyanosis, R>L edema 9. Neurologically Grossly intact, moving all 4 extremities equally   10. MSK: Normal range of motion   All other LABS:     Recent Labs  Lab 05/08/19 1329  WBC 5.6  NEUTROABS 4.5  HGB 10.2*  HCT 33.0*  MCV 92.7  PLT 190     Recent Labs  Lab 05/08/19 1329  NA 134*  K 4.1  CL 104  CO2 20*  GLUCOSE 89  BUN 15  CREATININE 0.76  CALCIUM 8.5*     Recent Labs  Lab 05/08/19 1329  AST 33  ALT 20  ALKPHOS 106  BILITOT 0.4  PROT 7.4  ALBUMIN 2.6*       Cultures:    Component Value Date/Time   SDES  04/19/2019 1743    BLOOD LEFT ANTECUBITAL Performed at [facility], [place] [address], [place], [place] [address]    SPECREQUEST  04/19/2019 1743    BOTTLES DRAWN AEROBIC ONLY Blood Culture adequate volume Performed at [facility] [address], [place], [place] [address]    CULT  04/19/2019 1743    NO GROWTH 5 DAYS Performed at [facility] [address] [place], [place] [address]    REPTSTATUS 04/24/2019 FINAL 04/19/2019 1743     Radiological Exams on Admission: Ct Angio Chest Pe W And/or Wo Contrast  Result Date: 05/08/2019 CLINICAL DATA:  Elevated D-dimer. Shortness of breath. History of lupus. Evaluate for pulmonary embolism. EXAM: CT ANGIOGRAPHY CHEST WITH CONTRAST TECHNIQUE: Multidetector CT imaging of the chest was performed using the standard protocol during bolus administration of intravenous contrast. Multiplanar CT image reconstructions and MIPs were obtained to evaluate the vascular anatomy. CONTRAST:  141m OMNIPAQUE IOHEXOL 350 MG/ML SOLN COMPARISON:  Chest CT-06/10/2018; 05/30/2018 FINDINGS: Vascular Findings: There is adequate opacification of the pulmonary arterial system with the main pulmonary artery measuring 400 Hounsfield units. There are no discrete filling defects within the  pulmonary arterial tree to suggest pulmonary embolism. Borderline enlarged caliber of the main pulmonary artery measuring 32 mm in diameter, similar to the 01/2019 examination. Borderline cardiomegaly. No pericardial effusion. No evidence of thoracic aortic aneurysm or dissection on this non gated examination. The left vertebral artery is incidentally noted to arise directly from the aortic arch. The branch vessels of the aortic arch appear patent throughout their imaged courses. Review of the MIP images confirms the above findings. ---------------------------------------------------------------------------------- Nonvascular Findings: Mediastinum/Lymph Nodes: No bulky mediastinal, hilar or axillary lymphadenopathy. Lungs/Pleura: Minimal nodular subsegmental atelectasis involving the medial basilar segment of the left lower lobe (image 90, series 11). Minimal dependent subpleural ground-glass atelectasis. No air bronchograms. No pleural effusion or pneumothorax. The central pulmonary airways appear widely patent. No discrete pulmonary nodules. Upper abdomen: Limited early arterial phase evaluation of the upper abdomen is normal. Musculoskeletal: No acute or aggressive osseous abnormalities. Regional soft tissues appear normal. Normal appearance of the thyroid gland. IMPRESSION: 1. No acute cardiopulmonary disease. Specifically, no evidence of pulmonary embolism. 2. Redemonstrated borderline cardiomegaly enlargement of the caliber of the main pulmonary artery, nonspecific though could be seen in the setting of pulmonary arterial hypertension. Further evaluation with cardiac echo could performed as indicated. Electronically Signed   By: JJenny Reichmann  Watts M.D.   On: 05/08/2019 17:31   Dg Chest Portable 1 View  Result Date: 05/08/2019 CLINICAL DATA:  Shortness of breath EXAM: PORTABLE CHEST 1 VIEW COMPARISON:  04/19/2019 FINDINGS: Heart and mediastinal contours are within normal limits. No focal opacities or effusions. No  acute bony abnormality. IMPRESSION: No active disease. Electronically Signed   By: Rolm Baptise M.D.   On: 05/08/2019 15:18   Vas [place] Lower Extremity Venous (dvt) (only [facility])  Result Date: 05/08/2019  Lower Venous Study Indications: Swelling.  Risk Factors: None identified. Comparison Study: 03/22/2019 - Negative for DVT. Performing Technologist: Oliver Hum RVT  Examination Guidelines: A complete evaluation includes B-mode imaging, spectral Doppler, color Doppler, and power Doppler as needed of all accessible portions of each vessel. Bilateral testing is considered an integral part of a complete examination. Limited examinations for reoccurring indications may be performed as noted.  +---------+---------------+---------+-----------+----------+--------------+  RIGHT     Compressibility Phasicity Spontaneity Properties Thrombus Aging  +---------+---------------+---------+-----------+----------+--------------+  CFV       Full            Yes       Yes                                    +---------+---------------+---------+-----------+----------+--------------+  SFJ       Full                                                             +---------+---------------+---------+-----------+----------+--------------+  FV Prox   Full                                                             +---------+---------------+---------+-----------+----------+--------------+  FV Mid    None            No        No                     Acute           +---------+---------------+---------+-----------+----------+--------------+  FV Distal None            No        No                     Acute           +---------+---------------+---------+-----------+----------+--------------+  PFV       Full                                                             +---------+---------------+---------+-----------+----------+--------------+  POP       None            No        No  Acute            +---------+---------------+---------+-----------+----------+--------------+  PTV       Partial                                          Acute           +---------+---------------+---------+-----------+----------+--------------+  PERO      Partial                                          Acute           +---------+---------------+---------+-----------+----------+--------------+  Soleal    None                                             Acute           +---------+---------------+---------+-----------+----------+--------------+  Gastroc   None                                             Acute           +---------+---------------+---------+-----------+----------+--------------+   +----+---------------+---------+-----------+----------+--------------+  LEFT Compressibility Phasicity Spontaneity Properties Thrombus Aging  +----+---------------+---------+-----------+----------+--------------+  CFV  Full            Yes       Yes                                    +----+---------------+---------+-----------+----------+--------------+     Summary: Right: Findings consistent with acute deep vein thrombosis involving the right femoral vein, right popliteal vein, right posterior tibial veins, right peroneal veins, right soleal veins, and right gastrocnemius veins. No cystic structure found in the popliteal fossa. Left: No evidence of common femoral vein obstruction.  *See table(s) above for measurements and observations.    Preliminary     Chart has been reviewed    Assessment/Plan  23 y.o. female with medical history significant of lupus nephritis, h/oRaynaud's phenomenon, HTN, gout  Admitted for DVT and sepsis likely due to UTI  Present on Admission:  Right leg DVT ([place]) -given significant clot burden and initially suspect PE started on heparin.  Transition to [facility] as able.  Will need care coordinator consult   Sepsis ([place]) -  -SIRS criteria met with  tachycardia , fever.      without evidence of end organ  damage such as  -Most likely source being  Urinary,   - Obtain serial lactic acid and procalcitonin level.  - Initiate IV antibiotics   - await results of blood and urine culture  - Rehydrate aggressively   SLE (systemic lupus erythematosus) (HCC) - prednisone continue at 33m  Will need to clarify in the morning if patient still on Prograf with a plan to change her therapy   Essential hypertension -restart Lopressor as patient has not been compliant   Sinus tachycardia -recurrent patient is on chronic Lopressor and has not been compliant we will restart check TSH   Acute lower UTI -low-grade fever with  occasional dysuria will await results of urine culture for now treat with cefepime given recent admission.  And immunocompromise state   Cardiomegaly obtain echogram in a.m.  Low albumin will check prealbumin nutritional consult  Other plan as per orders.  DVT prophylaxis:  heparin    Code Status:  FULL CODE as per patient   I had personally discussed CODE STATUS with patient    Family Communication:   Family not at  Bedside    Disposition Plan:      To home once workup is complete and patient is stable                     Would benefit from PT/OT eval prior to DC                         Nutrition    consulted                                       Consults called: none   Admission status:  ED Disposition    ED Disposition Condition [place]: [place] [100102]  Level of Care: Stepdown [14]  Admit to SDU based on following criteria: Hemodynamic compromise or significant risk of instability:  Patient requiring short term acute titration and management of vasoactive drips, and invasive monitoring (i.e., CVP and Arterial line).  Covid Evaluation: Confirmed COVID Negative  Diagnosis: Sepsis [facility]) [[id number]]  Admitting Physician: Toy Baker [[id number]]  Attending Physician: Toy Baker [[id number]]  Estimated length of  stay: 3 - 4 days  Certification:: I certify this patient will need inpatient services for at least 2 midnights  PT Class (Do Not Modify): Inpatient [101]  PT Acc Code (Do Not Modify): Private [1]         inpatient     Expect 2 midnight stay secondary to severity of patient's current illness including   hemodynamic instability despite optimal treatment (tachycardia      Severe lab/radiological/exam abnormalities including:  DVT   and extensive comorbidities including:   Chronic pain, SLE   That are currently affecting medical management.   I expect  patient to be hospitalized for 2 midnights requiring inpatient medical care.  Patient is at high risk for adverse outcome (such as loss of life or disability) if not treated.  Indication for inpatient stay as follows:    Hemodynamic instability despite maximal medical therapy,     Need for IV antibiotics, IV fluids, IV  medications    Level of care          SDU tele indefinitely please discontinue once patient no longer qualifies  Precautions:  NONE   No active isolations  PPE: Used by the provider:   P100  eye Goggles,  Gloves     Melquan Ernsberger 05/08/2019, 8:05 PM     [facility]     after 2 AM please page floor coverage PA If 7AM-7PM, please contact the day team taking care of the patient using [email or web address]

## 2019-05-08 NOTE — Progress Notes (Signed)
Pharmacy Antibiotic Note  [name] [name] is a 23 y.o. female admitted on 05/08/2019 with DVT.  Subsequently found to have low grade fever, tachycardia.  Recently seen for UTI so starting broad-spectrum antibiotics for ossible UTI, urosepsis. Pharmacy has been consulted for Cefepime dosing. Renal function at baseline, est CrCl ~49ml/min.   Plan: Cefepime 2gm IV q8h No further dose adjustments anticipated.  Pharmacy will sign off.  Please re-consult if needed.   Height: 5\' 3"  (160 cm) Weight: 140 lb (63.5 kg) IBW/kg (Calculated) : 52.4  Temp (24hrs), Avg:100.2 F (37.9 C), Min:99.7 F (37.6 C), Max:100.8 F (38.2 C)  Recent Labs  Lab 05/08/19 1329 05/08/19 1547  WBC 5.6  --   CREATININE 0.76  --   LATICACIDVEN  --  1.1    Estimated Creatinine Clearance: 97.2 mL/min (by C-G formula based on SCr of 0.76 mg/dL).    Allergies  Allergen Reactions  . Hydroxychloroquine Rash  . Latex Rash    Antimicrobials this admission: 8/26 Rocephin x1  8/26 Cefepime >>   Dose adjustments this admission:  Microbiology results: 8/26 BCx:  8/26 COVID: negative 8/26 UCx:    Thank you for allowing pharmacy to be a part of this patient's care.  Biagio Borg 05/08/2019 7:53 PM

## 2019-05-08 NOTE — Progress Notes (Signed)
Right lower extremity venous duplex has been completed. Preliminary results can be found in CV Proc through chart review.  Results were given to Dr. Darl Householder.  05/08/19 3:22 PM [name] [name] RVT

## 2019-05-09 ENCOUNTER — Inpatient Hospital Stay (HOSPITAL_COMMUNITY): Payer: Self-pay

## 2019-05-09 DIAGNOSIS — R609 Edema, unspecified: Secondary | ICD-10-CM

## 2019-05-09 DIAGNOSIS — I340 Nonrheumatic mitral (valve) insufficiency: Secondary | ICD-10-CM

## 2019-05-09 DIAGNOSIS — I361 Nonrheumatic tricuspid (valve) insufficiency: Secondary | ICD-10-CM

## 2019-05-09 LAB — PHOSPHORUS: Phosphorus: 4.1 mg/dL (ref 2.5–4.6)

## 2019-05-09 LAB — BASIC METABOLIC PANEL
Anion gap: 9 (ref 5–15)
BUN: 15 mg/dL (ref 6–20)
CO2: 17 mmol/L — ABNORMAL LOW (ref 22–32)
Calcium: 8.2 mg/dL — ABNORMAL LOW (ref 8.9–10.3)
Chloride: 111 mmol/L (ref 98–111)
Creatinine, Ser: 0.73 mg/dL (ref 0.44–1.00)
GFR calc Af Amer: >60 mL/min (ref >60)
GFR calc non Af Amer: >60 mL/min (ref >60)
Glucose, Bld: 119 mg/dL — ABNORMAL HIGH (ref 70–99)
Potassium: 4.2 mmol/L (ref 3.5–5.1)
Sodium: 137 mmol/L (ref 135–145)

## 2019-05-09 LAB — COMPREHENSIVE METABOLIC PANEL
ALT: 18 U/L (ref 0–44)
AST: 29 U/L (ref 15–41)
Albumin: 2.1 g/dL — ABNORMAL LOW (ref 3.5–5.0)
Alkaline Phosphatase: 74 U/L (ref 38–126)
Anion gap: 8 (ref 5–15)
BUN: 13 mg/dL (ref 6–20)
CO2: 16 mmol/L — ABNORMAL LOW (ref 22–32)
Calcium: 7.8 mg/dL — ABNORMAL LOW (ref 8.9–10.3)
Chloride: 109 mmol/L (ref 98–111)
Creatinine, Ser: 0.6 mg/dL (ref 0.44–1.00)
GFR calc Af Amer: >60 mL/min (ref >60)
GFR calc non Af Amer: >60 mL/min (ref >60)
Glucose, Bld: 87 mg/dL (ref 70–99)
Potassium: 3.9 mmol/L (ref 3.5–5.1)
Sodium: 133 mmol/L — ABNORMAL LOW (ref 135–145)
Total Bilirubin: 0.3 mg/dL (ref 0.3–1.2)
Total Protein: 6.4 g/dL — ABNORMAL LOW (ref 6.5–8.1)

## 2019-05-09 LAB — CBC
HCT: 32.5 % — ABNORMAL LOW (ref 36.0–46.0)
HCT: 31.1 % — ABNORMAL LOW (ref 36.0–46.0)
Hemoglobin: 10.1 g/dL — ABNORMAL LOW (ref 12.0–15.0)
Hemoglobin: 9.9 g/dL — ABNORMAL LOW (ref 12.0–15.0)
MCH: 29.4 pg (ref 26.0–34.0)
MCH: 28.8 pg (ref 26.0–34.0)
MCHC: 31.1 g/dL (ref 30.0–36.0)
MCHC: 31.8 g/dL (ref 30.0–36.0)
MCV: 94.5 fL (ref 80.0–100.0)
MCV: 90.4 fL (ref 80.0–100.0)
Platelets: 189 10*3/uL (ref 150–400)
Platelets: 124 10*3/uL — ABNORMAL LOW (ref 150–400)
RBC: 3.44 MIL/uL — ABNORMAL LOW (ref 3.87–5.11)
RBC: 3.44 MIL/uL — ABNORMAL LOW (ref 3.87–5.11)
RDW: 16.2 % — ABNORMAL HIGH (ref 11.5–15.5)
RDW: 15.9 % — ABNORMAL HIGH (ref 11.5–15.5)
WBC: 2.2 10*3/uL — ABNORMAL LOW (ref 4.0–10.5)
WBC: 2.5 10*3/uL — ABNORMAL LOW (ref 4.0–10.5)
nRBC: 0 % (ref 0.0–0.2)
nRBC: 0 % (ref 0.0–0.2)

## 2019-05-09 LAB — DIFFERENTIAL
Abs Immature Granulocytes: 0.05 10*3/uL (ref 0.00–0.07)
Basophils Absolute: 0 10*3/uL (ref 0.0–0.1)
Basophils Relative: 0 %
Eosinophils Absolute: 0 10*3/uL (ref 0.0–0.5)
Eosinophils Relative: 0 %
Immature Granulocytes: 2 %
Lymphocytes Relative: 24 %
Lymphs Abs: 0.6 10*3/uL — ABNORMAL LOW (ref 0.7–4.0)
Monocytes Absolute: 0.2 10*3/uL (ref 0.1–1.0)
Monocytes Relative: 8 %
Neutro Abs: 1.7 10*3/uL (ref 1.7–7.7)
Neutrophils Relative %: 66 %

## 2019-05-09 LAB — URINE CULTURE: Culture: <10000 — AB

## 2019-05-09 LAB — ECHOCARDIOGRAM COMPLETE
Height: 63 in
Weight: 2014.12 oz

## 2019-05-09 LAB — LACTIC ACID, PLASMA: Lactic Acid, Venous: 1.1 mmol/L (ref 0.5–1.9)

## 2019-05-09 LAB — PREALBUMIN: Prealbumin: 8.4 mg/dL — ABNORMAL LOW (ref 18–38)

## 2019-05-09 LAB — HEPARIN LEVEL (UNFRACTIONATED)
Heparin Unfractionated: 0.14 IU/mL — ABNORMAL LOW (ref 0.30–0.70)
Heparin Unfractionated: 0.19 IU/mL — ABNORMAL LOW (ref 0.30–0.70)

## 2019-05-09 LAB — MAGNESIUM: Magnesium: 1.7 mg/dL (ref 1.7–2.4)

## 2019-05-09 LAB — TSH: TSH: 3.309 u[IU]/mL (ref 0.350–4.500)

## 2019-05-09 MED ORDER — PRO-STAT SUGAR FREE PO LIQD
30.0000 mL | Freq: Two times a day (BID) | ORAL | Status: DC
  Administered 2019-05-09 – 2019-05-11 (×4): 30 mL via ORAL
  Filled 2019-05-09 (×4): qty 30

## 2019-05-09 MED ORDER — LACTATED RINGERS IV BOLUS
1000.0000 mL | Freq: Once | INTRAVENOUS | Status: AC
  Administered 2019-05-09: 1000 mL via INTRAVENOUS

## 2019-05-09 MED ORDER — HEPARIN BOLUS VIA INFUSION
2000.0000 [IU] | Freq: Once | INTRAVENOUS | Status: AC
  Administered 2019-05-09: 20:00:00 2000 [IU] via INTRAVENOUS
  Filled 2019-05-09: qty 2000

## 2019-05-09 MED ORDER — BOOST / RESOURCE BREEZE PO LIQD CUSTOM
1.0000 | Freq: Two times a day (BID) | ORAL | Status: DC
  Administered 2019-05-09 – 2019-05-11 (×5): 1 via ORAL

## 2019-05-09 MED ORDER — HEPARIN (PORCINE) 25000 UT/250ML-% IV SOLN
1500.0000 [IU]/h | INTRAVENOUS | Status: DC
  Administered 2019-05-09: 1500 [IU]/h via INTRAVENOUS
  Administered 2019-05-10: 1700 [IU]/h via INTRAVENOUS
  Filled 2019-05-09 (×2): qty 250

## 2019-05-09 MED ORDER — ADULT MULTIVITAMIN W/MINERALS CH
1.0000 | ORAL_TABLET | Freq: Every day | ORAL | Status: DC
  Administered 2019-05-09 – 2019-05-11 (×3): 1 via ORAL
  Filled 2019-05-09 (×3): qty 1

## 2019-05-09 MED ORDER — HEPARIN BOLUS VIA INFUSION
2000.0000 [IU] | Freq: Once | INTRAVENOUS | Status: AC
  Administered 2019-05-09: 2000 [IU] via INTRAVENOUS
  Filled 2019-05-09: qty 2000

## 2019-05-09 MED ORDER — PRO-STAT SUGAR FREE PO LIQD: 30.0000 mL | Freq: Every day | ORAL | Status: DC

## 2019-05-09 MED ORDER — LACTATED RINGERS IV SOLN
INTRAVENOUS | Status: AC
  Administered 2019-05-09 – 2019-05-10 (×3): via INTRAVENOUS

## 2019-05-09 NOTE — Plan of Care (Signed)
  Problem: Education: Goal: Knowledge of General Education information will improve Description: Including pain rating scale, medication(s)/side effects and non-pharmacologic comfort measures Outcome: Progressing   Problem: Clinical Measurements: Goal: Diagnostic test results will improve Outcome: Progressing   

## 2019-05-09 NOTE — Progress Notes (Signed)
Pharmacy: Cellcept/Prograf clarification  Pt states she takes Cellcept (mycophenolate) and not Prograf (tacrolimus) but seemed uncertain (told Dr Florene Glen she take Prograf and not Cellcept).  [facility] [address] [facility] was in April 2020 and pt did NOT pick up Rx.  She did have a 5 day Rx for Cellcept from Dr Nevada Crane of [facility] from recent admission.  She also has not picked up her Rx for Prograf for Prograf. [facility] states [facility] has been on backorder and difficult to obtain.  [facility] [phone] - RN thinks she failed cellcept/prograft and rec to try IV Cytoxan.  Unable to reach MD after call x 2.   Rec for MD to call [facility] MD only line: [phone] and have Dr Randol Kern paged if needed.  Eudelia Bunch, Pharm.D [phone] 05/09/2019 3:17 PM

## 2019-05-09 NOTE — Progress Notes (Signed)
PROGRESS NOTE    [name] [name]  [id number] DOB: 1996-04-16 DOA: 05/08/2019 PCP: [name], FNP   Brief Narrative:  [name] [name] is [name] [name] 23 y.o. female with medical history significant of lupus nephritis, h/oRaynaud's phenomenon, HTN, gout who presented with RLE swelling and was found to have DVT as well as SIRS with concern for UTI and sepsis.   Assessment & Plan:   Active Problems:   SLE (systemic lupus erythematosus) (HCC)   Essential hypertension   Sinus tachycardia   Acute lower UTI   Sepsis (HCC)   Right leg DVT (HCC)   Cardiomegaly   Systemic Inflammatory Response Syndrome: UA concerning for possible infection with WBC, RBC, +LE, few bacteria.  CXR and CT without evidence of infection.  Follow blood and urine cultures.   Recurrent fevers today as well as tachycardia Continue abx for possible UTI Fevers may be related to her extensive DVT Procalcitonin negative  Right leg DVT:  Extensive involving R femoral, popliteal, posterior tibial, peroneal, soleal, and gastroc veins.  Continue heparin gtt for now.  Plan for transition to doac on d/c.  Follow LLE [place] as well as RUE [place] as well.  Sinus Tachycardia: follow echo, no PE on CT scan.  Likely related to fevers.  Follow bolus and IVF.  TSH within normal limits.   SLE (systemic lupus erythematosus)   Lupus Nephritis  - prednisone continue at 40mg .  Discussed with rheum who recommended cellcept (but hold with concern for fever/infection above).  He notes prograf was started by nephrology, but he recently had discussion with nephrology and they were planning to transition her to cyclophosphamide as an outpatient.  Will continue steroids for now, hold prograf and cellcept.  Follow C3, C4.     Essential hypertension -restart Lopressor as patient has not been compliant  Acute lower UTI -low-grade fever with occasional dysuria will await results of urine culture for now treat with cefepime given recent admission.  And  immunocompromise state  Cardiomegaly   Concern for Pulm Artery Hypertension: follow echo  Low albumin will check prealbumin nutritional consult  DVT prophylaxis: heparin gtt Code Status: full  Family Communication: none Disposition Plan: pending further improvement   Consultants:   none  Procedures:  LE [place]   Summary: Right: Findings consistent with acute deep vein thrombosis involving the right femoral vein, right popliteal vein, right posterior tibial veins, right peroneal veins, right soleal veins, and right gastrocnemius veins. No cystic structure found in the  popliteal fossa. Left: No evidence of common femoral vein obstruction.  Antimicrobials: Anti-infectives (From admission, onward)   Start     Dose/Rate Route Frequency Ordered Stop   05/08/19 2200  ceFEPIme (MAXIPIME) 2 g in sodium chloride 0.9 % 100 mL IVPB     2 g 200 mL/hr over 30 Minutes Intravenous Every 8 hours 05/08/19 2002     05/08/19 1645  cefTRIAXone (ROCEPHIN) 1 g in sodium chloride 0.9 % 100 mL IVPB     1 g 200 mL/hr over 30 Minutes Intravenous  Once 05/08/19 1634 05/08/19 1819     Subjective: Feels ok.  Swelling in RUE and RLE.   RLE swelling for [name] [name] few days.  RUE she thinks IV infiltrated.  Objective: Vitals:   05/09/19 0800 05/09/19 0802 05/09/19 0900 05/09/19 1200  BP:  133/90    Pulse:      Resp:  (!) 22    Temp: (!) 100.7 F (38.2 C)  (!) 101.1 F (38.4 C) 99.1 F (  37.3 C)  TempSrc: Oral  Oral Oral  SpO2:      Weight:      Height:        Intake/Output Summary (Last 24 hours) at 05/09/2019 1319 Last data filed at 05/09/2019 1027 Gross per 24 hour  Intake 4126.67 ml  Output 850 ml  Net 3276.67 ml   Filed Weights   05/08/19 1145 05/08/19 2120  Weight: 63.5 kg 57.1 kg    Examination:  General exam: Appears calm and comfortable  Respiratory system: Clear to auscultation. Respiratory effort normal. Cardiovascular system: S1 & S2 heard, tachycardic Gastrointestinal system:  Abdomen is nondistended, soft and nontender.  Central nervous system: Alert and oriented. No focal neurological deficits. Extremities: RUE and RLE swelling, palpable DP pulses Skin: No rashes, lesions or ulcers Psychiatry: Judgement and insight appear normal. Mood & affect appropriate.     Data Reviewed: I have personally reviewed following labs and imaging studies  CBC: Recent Labs  Lab 05/08/19 1329 05/09/19 0141 05/09/19 0748  WBC 5.6 2.2* 2.5*  NEUTROABS 4.5  --  1.7  HGB 10.2* 10.1* 9.9*  HCT 33.0* 32.5* 31.1*  MCV 92.7 94.5 90.4  PLT 190 189 [id number]*   Basic Metabolic Panel: Recent Labs  Lab 05/08/19 1329 05/09/19 0141 05/09/19 0749  NA 134*  --  133*  K 4.1  --  3.9  CL 104  --  109  CO2 20*  --  16*  GLUCOSE 89  --  87  BUN 15  --  13  CREATININE 0.76  --  0.60  CALCIUM 8.5*  --  7.8*  MG  --  1.7  --   PHOS  --  4.1  --    GFR: Estimated Creatinine Clearance: 89.7 mL/min (by C-G formula based on SCr of 0.6 mg/dL). Liver Function Tests: Recent Labs  Lab 05/08/19 1329 05/09/19 0749  AST 33 29  ALT 20 18  ALKPHOS 106 74  BILITOT 0.4 0.3  PROT 7.4 6.4*  ALBUMIN 2.6* 2.1*   No results for input(s): LIPASE, AMYLASE in the last 168 hours. No results for input(s): AMMONIA in the last 168 hours. Coagulation Profile: No results for input(s): INR, PROTIME in the last 168 hours. Cardiac Enzymes: Recent Labs  Lab 05/08/19 1329  CKTOTAL 35*   BNP (last 3 results) No results for input(s): PROBNP in the last 8760 hours. HbA1C: No results for input(s): HGBA1C in the last 72 hours. CBG: No results for input(s): GLUCAP in the last 168 hours. Lipid Profile: No results for input(s): CHOL, HDL, LDLCALC, TRIG, CHOLHDL, LDLDIRECT in the last 72 hours. Thyroid Function Tests: Recent Labs    05/09/19 0141  TSH 3.309   Anemia Panel: No results for input(s): VITAMINB12, FOLATE, FERRITIN, TIBC, IRON, RETICCTPCT in the last 72 hours. Sepsis Labs: Recent Labs    Lab 05/08/19 1328 05/08/19 1547 05/08/19 2207 05/09/19 0141  PROCALCITON <0.10  --   --   --   LATICACIDVEN  --  1.1 1.2 1.1    Recent Results (from the past 240 hour(s))  SARS Coronavirus 2 [facility] order, Performed in [facility] hospital lab) Nasopharyngeal Nasopharyngeal Swab     Status: None   Collection Time: 05/08/19  3:46 PM   Specimen: Nasopharyngeal Swab  Result Value Ref Range Status   SARS Coronavirus 2 NEGATIVE NEGATIVE Final    Comment: (NOTE) If result is NEGATIVE SARS-CoV-2 target nucleic acids are NOT DETECTED. The SARS-CoV-2 RNA is generally detectable in upper and lower  respiratory specimens during the acute phase of infection. The lowest  concentration of SARS-CoV-2 viral copies this assay can detect is 250  copies / mL. [name] [name] negative result does not preclude SARS-CoV-2 infection  and should not be used as the sole basis for treatment or other  patient management decisions.  [name] [name] negative result may occur with  improper specimen collection / handling, submission of specimen other  than nasopharyngeal swab, presence of viral mutation(s) within the  areas targeted by this assay, and inadequate number of viral copies  (<250 copies / mL). [name] [name] negative result must be combined with clinical  observations, patient history, and epidemiological information. If result is POSITIVE SARS-CoV-2 target nucleic acids are DETECTED. The SARS-CoV-2 RNA is generally detectable in upper and lower  respiratory specimens dur ing the acute phase of infection.  Positive  results are indicative of active infection with SARS-CoV-2.  Clinical  correlation with patient history and other diagnostic information is  necessary to determine patient infection status.  Positive results do  not rule out bacterial infection or co-infection with other viruses. If result is PRESUMPTIVE POSTIVE SARS-CoV-2 nucleic acids MAY BE PRESENT.   [name] [name] presumptive positive result was obtained on the submitted  specimen  and confirmed on repeat testing.  While 2019 novel coronavirus  (SARS-CoV-2) nucleic acids may be present in the submitted sample  additional confirmatory testing may be necessary for epidemiological  and / or clinical management purposes  to differentiate between  SARS-CoV-2 and other Sarbecovirus currently known to infect humans.  If clinically indicated additional testing with an alternate test  methodology [id number]) is advised. The SARS-CoV-2 RNA is generally  detectable in upper and lower respiratory sp ecimens during the acute  phase of infection. The expected result is Negative. Fact Sheet for Patients:  StrictlyIdeas.no Fact Sheet for Healthcare Providers: BankingDealers.co.za This test is not yet approved or cleared by the [place] FDA and has been authorized for detection and/or diagnosis of SARS-CoV-2 by FDA under an Emergency Use Authorization (EUA).  This EUA will remain in effect (meaning this test can be used) for the duration of the COVID-19 declaration under Section 564(b)(1) of the Act, 21 U.S.C. section 360bbb-3(b)(1), unless the authorization is terminated or revoked sooner. Performed at [facility], [place] [address], [place], [place] [address]   Blood culture (routine x 2)     Status: None (Preliminary result)   Collection Time: 05/08/19  3:47 PM   Specimen: BLOOD  Result Value Ref Range Status   Specimen Description   Final    BLOOD RIGHT ANTECUBITAL Performed at [facility] [address], [place], [place] [address]    Special Requests   Final    BOTTLES DRAWN AEROBIC AND ANAEROBIC Blood Culture adequate volume Performed at [facility] [address], [place], [place] [address]    Culture   Final    NO GROWTH < 24 HOURS Performed at [facility] [address] [place], [place] [address]    Report Status PENDING  Incomplete    Blood culture (routine x 2)     Status: None (Preliminary result)   Collection Time: 05/08/19  5:03 PM   Specimen: BLOOD  Result Value Ref Range Status   Specimen Description   Final    BLOOD BLOOD LEFT FOREARM Performed at [facility] [address], [place], [place] [address]    Special Requests   Final    BOTTLES DRAWN AEROBIC  AND ANAEROBIC Blood Culture adequate volume Performed at [facility] [address] [place], [place] [address]    Culture   Final    NO GROWTH < 24 HOURS Performed at [facility] [address] [place], [place] [address]    Report Status PENDING  Incomplete  MRSA PCR Screening     Status: None   Collection Time: 05/08/19  9:22 PM   Specimen: Nasal Mucosa; Nasopharyngeal  Result Value Ref Range Status   MRSA by PCR NEGATIVE NEGATIVE Final    Comment:        The GeneXpert MRSA Assay (FDA approved for NASAL specimens only), is one component of [facility] comprehensive MRSA colonization surveillance program. It is not intended to diagnose MRSA infection nor to guide or monitor treatment for MRSA infections. Performed at [facility], [place] [address], [place], [place] [address]          Radiology Studies: Ct Angio Chest Pe W And/or Wo Contrast  Result Date: 05/08/2019 CLINICAL DATA:  Elevated D-dimer. Shortness of breath. History of lupus. Evaluate for pulmonary embolism. EXAM: CT ANGIOGRAPHY CHEST WITH CONTRAST TECHNIQUE: Multidetector CT imaging of the chest was performed using the standard protocol during bolus administration of intravenous contrast. Multiplanar CT image reconstructions and MIPs were obtained to evaluate the vascular anatomy. CONTRAST:  164mL OMNIPAQUE IOHEXOL 350 MG/ML SOLN COMPARISON:  Chest CT-06/10/2018; 05/30/2018 FINDINGS: Vascular Findings: There is adequate opacification of the pulmonary arterial system with the main pulmonary artery measuring 400 Hounsfield  units. There are no discrete filling defects within the pulmonary arterial tree to suggest pulmonary embolism. Borderline enlarged caliber of the main pulmonary artery measuring 32 mm in diameter, similar to the 01/2019 examination. Borderline cardiomegaly. No pericardial effusion. No evidence of thoracic aortic aneurysm or dissection on this non gated examination. The left vertebral artery is incidentally noted to arise directly from the aortic arch. The branch vessels of the aortic arch appear patent throughout their imaged courses. Review of the MIP images confirms the above findings. ---------------------------------------------------------------------------------- Nonvascular Findings: Mediastinum/Lymph Nodes: No bulky mediastinal, hilar or axillary lymphadenopathy. Lungs/Pleura: Minimal nodular subsegmental atelectasis involving the medial basilar segment of the left lower lobe (image 90, series 11). Minimal dependent subpleural ground-glass atelectasis. No air bronchograms. No pleural effusion or pneumothorax. The central pulmonary airways appear widely patent. No discrete pulmonary nodules. Upper abdomen: Limited early arterial phase evaluation of the upper abdomen is normal. Musculoskeletal: No acute or aggressive osseous abnormalities. Regional soft tissues appear normal. Normal appearance of the thyroid gland. IMPRESSION: 1. No acute cardiopulmonary disease. Specifically, no evidence of pulmonary embolism. 2. Redemonstrated borderline cardiomegaly enlargement of the caliber of the main pulmonary artery, nonspecific though could be seen in the setting of pulmonary arterial hypertension. Further evaluation with cardiac echo could performed as indicated. Electronically Signed   By: Sandi Mariscal M.D.   On: 05/08/2019 17:31   Dg Chest Portable 1 View  Result Date: 05/08/2019 CLINICAL DATA:  Shortness of breath EXAM: PORTABLE CHEST 1 VIEW COMPARISON:  04/19/2019 FINDINGS: Heart and mediastinal contours are  within normal limits. No focal opacities or effusions. No acute bony abnormality. IMPRESSION: No active disease. Electronically Signed   By: Rolm Baptise M.D.   On: 05/08/2019 15:18   Vas [place] Lower Extremity Venous (dvt) (only [facility])  Result Date: 05/09/2019  Lower Venous Study Indications: Swelling.  Risk Factors: None identified. Comparison Study: 03/22/2019 - Negative for DVT. Performing Technologist: Oliver Hum RVT  Examination Guidelines: [name] [name] complete evaluation includes B-mode imaging, spectral Doppler, color Doppler, and power Doppler as needed of all accessible portions of each vessel. Bilateral testing is considered an integral part of [name] [name] complete examination. Limited examinations for reoccurring indications may be performed as noted.  +---------+---------------+---------+-----------+----------+--------------+  RIGHT     Compressibility Phasicity Spontaneity Properties Thrombus Aging  +---------+---------------+---------+-----------+----------+--------------+  CFV       Full            Yes       Yes                                    +---------+---------------+---------+-----------+----------+--------------+  SFJ       Full                                                             +---------+---------------+---------+-----------+----------+--------------+  FV Prox   Full                                                             +---------+---------------+---------+-----------+----------+--------------+  FV Mid    None            No        No                     Acute           +---------+---------------+---------+-----------+----------+--------------+  FV Distal None            No        No                     Acute           +---------+---------------+---------+-----------+----------+--------------+  PFV       Full                                                             +---------+---------------+---------+-----------+----------+--------------+  POP       None            No        No                      Acute           +---------+---------------+---------+-----------+----------+--------------+  PTV       Partial                                          Acute           +---------+---------------+---------+-----------+----------+--------------+  PERO      Partial  Acute           +---------+---------------+---------+-----------+----------+--------------+  Soleal    None                                             Acute           +---------+---------------+---------+-----------+----------+--------------+  Gastroc   None                                             Acute           +---------+---------------+---------+-----------+----------+--------------+   +----+---------------+---------+-----------+----------+--------------+  LEFT Compressibility Phasicity Spontaneity Properties Thrombus Aging  +----+---------------+---------+-----------+----------+--------------+  CFV  Full            Yes       Yes                                    +----+---------------+---------+-----------+----------+--------------+     Summary: Right: Findings consistent with acute deep vein thrombosis involving the right femoral vein, right popliteal vein, right posterior tibial veins, right peroneal veins, right soleal veins, and right gastrocnemius veins. No cystic structure found in the popliteal fossa. Left: No evidence of common femoral vein obstruction.  *See table(s) above for measurements and observations. Electronically signed by [name] Barban MD on 05/09/2019 at 7:33:45 AM.    Final         Scheduled Meds:  buPROPion  150 mg Oral Daily   Chlorhexidine Gluconate Cloth  6 each Topical Daily   feeding supplement  1 Container Oral BID BM   feeding supplement (PRO-STAT SUGAR FREE 64)  30 mL Oral BID BM   ferrous sulfate  325 mg Oral BID   gabapentin  300 mg Oral TID   metoprolol tartrate  12.5 mg Oral BID   multivitamin with minerals  1 tablet Oral Daily    pantoprazole  20 mg Oral QHS   predniSONE  40 mg Oral Q breakfast   Continuous Infusions:  ceFEPime (MAXIPIME) IV Stopped (05/09/19 0546)   heparin 1,500 Units/hr (05/09/19 0933)   lactated ringers 100 mL/hr at 05/09/19 1049     LOS: 1 day    Time spent: over 26 min    Fayrene Helper, MD [facility] Pager AMION  If 7PM-7AM, please contact night-coverage [email or web address] Password [email or web address] 05/09/2019, 1:19 PM

## 2019-05-09 NOTE — Progress Notes (Signed)
Brief Pharmacy Note re: IV heparin  Heparin for new RLE DVT  O: HL 0.19 pn 1500 units/hr (goal 0.3-0.7)      No bleeding or infusion related issues reported by RN  A/P:  Re-bolus heparin 2000 units IV x1 then increase rate to 1700 units/hr          Recheck 6h heparin level after rate change  Netta Cedars, PharmD, BCPS 05/09/2019@7 :12 PM

## 2019-05-09 NOTE — Progress Notes (Signed)
Echocardiogram 2D Echocardiogram has been performed.  Oneal Deputy Luciel Brickman 05/09/2019, 9:33 AM

## 2019-05-09 NOTE — Progress Notes (Signed)
RUE venous duplex and LLE venous duplex       have been completed. Preliminary results can be found under CV proc through chart review. June Leap, BS, RDMS, RVT

## 2019-05-09 NOTE — Progress Notes (Signed)
[place] for heparin Indication:  RLE DVT, r/o PE  Allergies  Allergen Reactions  . Hydroxychloroquine Rash  . Latex Rash    Patient Measurements: Height: 5\' 3"  (160 cm) Weight: 125 lb 14.1 oz (57.1 kg) IBW/kg (Calculated) : 52.4 Heparin Dosing Weight: 63.5 kg  Vital Signs: Temp: 99.7 F (37.6 C) (08/27 0400) Temp Source: Oral (08/27 0400) BP: 164/105 (08/27 0500) Pulse Rate: 117 (08/26 2129)  Labs: Recent Labs    05/08/19 1329 05/08/19 2207 05/09/19 0141 05/09/19 0749  HGB 10.2*  --  10.1*  --   HCT 33.0*  --  32.5*  --   PLT 190  --  189  --   HEPARINUNFRC  --  0.11*  --  0.14*  CREATININE 0.76  --   --  0.60  CKTOTAL 35*  --   --   --   TROPONINIHS 4  --   --   --     Estimated Creatinine Clearance: 89.7 mL/min (by C-G formula based on SCr of 0.6 mg/dL).   Medical History: Past Medical History:  Diagnosis Date  . Gout   . Hypertension   . Lupus ([place])   . Raynaud disease     Medications: no anticoagulants PTA  Assessment: 23 yo F to start heparin per pharmacy for r/o PE/DVT.  Hg 10.2. PTLC 190. SCr 0.76. Wt 63.5 kg; D dimer > 20.   Doppler + for RLE DVT. CT angio neg for PE.   05/09/2019 Heparin level subtherapeutic at 0.14 after rate increased to 1300 units/hr CBC stable, no bleeding reported.  RN reports heparin infusing w/o issues  Goal of Therapy:  Heparin level 0.3-0.7 units/ml Monitor platelets by anticoagulation protocol: Yes   Plan:  Heparin 2000 unit bolus, increase drip to 1500 units/hr Check f/u HL Daily HL, CBC while on heparin F/u transition to oral anticoagulant  Eudelia Bunch, Pharm.D [phone] 05/09/2019 8:32 AM

## 2019-05-09 NOTE — Progress Notes (Signed)
Initial Nutrition Assessment  RD working remotely.   DOCUMENTATION CODES:   Not applicable  INTERVENTION:  - will order Boost Breeze BID, each supplement provides 250 kcal and 9 grams of protein. - will order 30 mL Prostat BID, each supplement provides 100 kcal and 15 grams of protein. - will order daily multivitamin with minerals. - continue to encourage PO intakes.    NUTRITION DIAGNOSIS:   Increased nutrient needs related to acute illness(sepsis) as evidenced by estimated needs.  GOAL:   Patient will meet greater than or equal to 90% of their needs  MONITOR:   PO intake, Supplement acceptance, Labs, Weight trends  REASON FOR ASSESSMENT:   Consult Assessment of nutrition requirement/status  ASSESSMENT:   23 y.o. female with medical history significant of SLE, lupus nephritis, Raynaud's phenomenon, HTN, and gout. She presented to the ED on 8/26 with complaint of severe R calf pain that had been ongoing x4 days. She reported sharp pain vs numbness and tingling to RUE and RLE. She was admitted on 8/7 for sepsis 2/2 UTI. She has stopped taking Cellcept as she feels it has been messing up her teeth. She reported chronic SOB and notes indicate recurrent tachycardia.  Chart indicates that patient is currently having a cardiac ultrasound done. Unable to talk with patient today. This RD is familiar with patient from hospitalization in July. At that time patient reported good appetite and intakes at baseline. She was having oral and throat pain at that time d/t thrush, which she had had in the past, but it does not seem that that is a current issue. Patient stated during last RD interaction that she does not like Ensure supplements but does like Boost Breeze. She had indicated during last discussion that she avoids greasy and fried foods as these seem to lead to and/or exacerbate lupus flares; she denied limiting or avoiding any other items in her diet for this reason.  Per chart  review, current weight is126 lb, weight on 7/29was 116 lb, and weight on 5/27 was 120 lb.  Noted that consult to RD placed d/t hypoalbuminemia. Suspect low albumin is reactive given hx of SLE, current dx of SIRS and sepsis. Albumin has a half-life of 21 days and is strongly affected by stress response and inflammatory process, therefore, do not expect to see an improvement in this lab value during acute hospitalization.   Per notes: - R leg DVT--significant clot burden, concern for PE - sepsis, SIRS--most likely d/t urinary source, aggressive re-hydration - SLE--steroid ordered - sinus tachycardia - acute lower UTI - cardiomegaly--plan for echogram - hypoalbuminemia   Labs reviewed; Na: 133 mmol/l, Ca: 7.8 mg/dl.  Medications reviewed; 325 mg ferrous sulfate BID, 20 mg oral protonix/day, 40 mg deltasone/day. IVF; NS @ 100 ml/hr.     NUTRITION - FOCUSED PHYSICAL EXAM:  unable to complete at this time.   Diet Order:   Diet Order            Diet regular Room service appropriate? Yes; Fluid consistency: Thin  Diet effective now              EDUCATION NEEDS:   No education needs have been identified at this time  Skin:  Skin Assessment: Reviewed RN Assessment  Last BM:  PTA/unknown  Height:   Ht Readings from Last 1 Encounters:  05/08/19 5\' 3"  (1.6 m)    Weight:   Wt Readings from Last 1 Encounters:  05/08/19 57.1 kg    Ideal Body Weight:  52.3 kg  BMI:  Body mass index is 22.3 kg/m.  Estimated Nutritional Needs:   Kcal:  [id number] kcal  Protein:  85-95 grams  Fluid:  >/= 2 L/day     Jarome Matin, MS, RD, LDN, [facility] Inpatient Clinical Dietitian Pager # [phone] After hours/weekend pager # [phone]

## 2019-05-09 NOTE — TOC Initial Note (Signed)
Transition of Care [facility]) - Initial/Assessment Note    Patient Details  Name: [name] [name] MRN: [id number] Date of Birth: [birth year]  Transition of Care [facility]) CM/SW Contact:    Lynnell Catalan, RN Phone Number: 05/09/2019, 12:52 PM  Clinical Narrative:                 CM consult for DOAC. Pt is established at the [facility]. If pt to DC on Xarelto or Eliquis then Pharmacy will need to provide pt with a free 30 day card for either Xarelto or Eliquis and pt will then need to follow up at [facility] for assistance with signing up with medication assistance program.  Expected Discharge Plan: Home/Self Care    Expected Discharge Plan and Services Expected Discharge Plan: Home/Self Care   Discharge Planning Services: CM Consult    Prior Living Arrangements/Services   Lives with:: Parents                   Activities of Daily Living Home Assistive Devices/Equipment: Environmental consultant (specify type) ADL Screening (condition at time of admission) Patient's cognitive ability adequate to safely complete daily activities?: Yes Is the patient deaf or have difficulty hearing?: No Does the patient have difficulty seeing, even when wearing glasses/contacts?: No Does the patient have difficulty concentrating, remembering, or making decisions?: No Patient able to express need for assistance with ADLs?: Yes Does the patient have difficulty dressing or bathing?: No Independently performs ADLs?: Yes (appropriate for developmental age) Does the patient have difficulty walking or climbing stairs?: No Weakness of Legs: Right Weakness of Arms/Hands: None  Admission diagnosis:  Acute cystitis without hematuria [N30.00] Acute deep vein thrombosis (DVT) of popliteal vein of right lower extremity ([place]) [I82.431] Sepsis, due to unspecified organism, unspecified whether acute organ dysfunction present [facility]) [A41.9] Patient Active Problem List   Diagnosis Date Noted  . Sepsis ([place]) 05/08/2019  . Right leg DVT ([place])  05/08/2019  . Cardiomegaly 05/08/2019  . Dysuria 04/19/2019  . Facial edema   . Rash due to systemic lupus erythematosus (SLE) ([place])   . Immunosuppressed status ([place])   . Acute lower UTI   . 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 Lupus (systemic lupus erythematosus) ([place]) 03/19/2019  . Bronchitis 03/19/2019  . Essential hypertension 03/19/2019  . Elevated AST (SGOT) 03/19/2019  . Dehydration 03/19/2019  . Sinus tachycardia 03/19/2019  . Metabolic acidosis 0000000  . Oral thrush 03/19/2019  . Relative acute adrenal insufficiency ([place]) 06/11/2018  . Anemia 06/09/2018  . Lobar pneumonia ([place]) 06/07/2018  . Pleuritic chest pain 06/05/2018  . Dysphagia 06/01/2018  . Odynophagia 06/01/2018  . Thrush 06/01/2018  . Mucositis oral 01/12/2018  . Vaginal candidiasis 01/12/2018  . Methotrexate toxicity   . Mouth ulcers   . Leukopenia 01/10/2018  . Accidental methotrexate overdose 01/08/2018  . Depression 02/08/2017  . Primary osteoarthritis of both knees 11/09/2016  . Chronic midline low back pain without sciatica 11/09/2016  . Vitamin D deficiency 11/09/2016  . SLE (systemic lupus erythematosus) ([place]) 09/19/2016  . Insomnia 01/29/2016  . Depo-Provera contraceptive status 08/11/2015  . Raynaud's disease 08/11/2015  . Seasonal allergies 08/11/2015  . IDA (iron deficiency anemia) 08/11/2015   PCP:  [name], FNP Pharmacy:   [id number] - [place], [address] [place] [address] Phone: [phone] Fax: [phone]     Social Determinants of Health (SDOH) Interventions    Readmission Risk Interventions Readmission  Risk Prevention Plan 05/09/2019 03/21/2019  Transportation Screening Complete Complete  PCP or Specialist Appt within 3-5 Days Not Complete Not Complete  Not Complete comments Not ready for dc pt is established with PCP however DC date unknown  [place] or  [place] Not Complete Not Complete  HRI or Home Care Consult comments NA no indication  Social Work Consult for [place] Planning/Counseling Not Complete Complete  SW consult not completed comments NA -  Palliative Care Screening Not Applicable Not Applicable  Medication Review Press photographer) Complete Complete  Some recent data might be hidden

## 2019-05-10 LAB — COMPREHENSIVE METABOLIC PANEL
ALT: 15 U/L (ref 0–44)
AST: 27 U/L (ref 15–41)
Albumin: 1.9 g/dL — ABNORMAL LOW (ref 3.5–5.0)
Alkaline Phosphatase: 70 U/L (ref 38–126)
Anion gap: 11 (ref 5–15)
BUN: 15 mg/dL (ref 6–20)
CO2: 18 mmol/L — ABNORMAL LOW (ref 22–32)
Calcium: 8.1 mg/dL — ABNORMAL LOW (ref 8.9–10.3)
Chloride: 111 mmol/L (ref 98–111)
Creatinine, Ser: 0.64 mg/dL (ref 0.44–1.00)
GFR calc Af Amer: >60 mL/min (ref >60)
GFR calc non Af Amer: >60 mL/min (ref >60)
Glucose, Bld: 93 mg/dL (ref 70–99)
Potassium: 3.4 mmol/L — ABNORMAL LOW (ref 3.5–5.1)
Sodium: 140 mmol/L (ref 135–145)
Total Bilirubin: 0.1 mg/dL — ABNORMAL LOW (ref 0.3–1.2)
Total Protein: 5.9 g/dL — ABNORMAL LOW (ref 6.5–8.1)

## 2019-05-10 LAB — CBC
HCT: 28 % — ABNORMAL LOW (ref 36.0–46.0)
Hemoglobin: 8.6 g/dL — ABNORMAL LOW (ref 12.0–15.0)
MCH: 28.9 pg (ref 26.0–34.0)
MCHC: 30.7 g/dL (ref 30.0–36.0)
MCV: 94 fL (ref 80.0–100.0)
Platelets: 157 10*3/uL (ref 150–400)
RBC: 2.98 MIL/uL — ABNORMAL LOW (ref 3.87–5.11)
RDW: 15.8 % — ABNORMAL HIGH (ref 11.5–15.5)
WBC: 2.3 10*3/uL — ABNORMAL LOW (ref 4.0–10.5)
nRBC: 0.9 % — ABNORMAL HIGH (ref 0.0–0.2)

## 2019-05-10 LAB — HEPARIN LEVEL (UNFRACTIONATED)
Heparin Unfractionated: 0.76 IU/mL — ABNORMAL HIGH (ref 0.30–0.70)
Heparin Unfractionated: 0.67 IU/mL (ref 0.30–0.70)

## 2019-05-10 LAB — C4 COMPLEMENT: Complement C4, Body Fluid: 12 mg/dL — ABNORMAL LOW (ref 14–44)

## 2019-05-10 LAB — MAGNESIUM: Magnesium: 1.7 mg/dL (ref 1.7–2.4)

## 2019-05-10 LAB — C3 COMPLEMENT: C3 Complement: 91 mg/dL (ref 82–167)

## 2019-05-10 MED ORDER — AMLODIPINE BESYLATE 5 MG PO TABS
5.0000 mg | ORAL_TABLET | Freq: Every day | ORAL | Status: DC
  Administered 2019-05-10 – 2019-05-11 (×2): 5 mg via ORAL
  Filled 2019-05-10 (×2): qty 1

## 2019-05-10 MED ORDER — LABETALOL HCL 5 MG/ML IV SOLN
10.0000 mg | INTRAVENOUS | Status: DC | PRN
  Administered 2019-05-10: 10 mg via INTRAVENOUS
  Filled 2019-05-10 (×2): qty 4

## 2019-05-10 MED ORDER — LABETALOL HCL 5 MG/ML IV SOLN: 10.0000 mg | INTRAVENOUS | Status: DC | PRN

## 2019-05-10 MED ORDER — TRAZODONE HCL 100 MG PO TABS
100.0000 mg | ORAL_TABLET | Freq: Every day | ORAL | Status: DC
  Administered 2019-05-10: 100 mg via ORAL
  Filled 2019-05-10: qty 1

## 2019-05-10 MED ORDER — METOPROLOL TARTRATE 25 MG PO TABS
25.0000 mg | ORAL_TABLET | Freq: Two times a day (BID) | ORAL | Status: DC
  Administered 2019-05-10 – 2019-05-11 (×2): 25 mg via ORAL
  Filled 2019-05-10 (×2): qty 1

## 2019-05-10 MED ORDER — RIVAROXABAN (XARELTO) EDUCATION KIT FOR DVT/PE PATIENTS
PACK | Freq: Once | Status: AC
  Administered 2019-05-10: 14:00:00
  Filled 2019-05-10: qty 1

## 2019-05-10 MED ORDER — POTASSIUM CHLORIDE CRYS ER 20 MEQ PO TBCR
40.0000 meq | EXTENDED_RELEASE_TABLET | Freq: Once | ORAL | Status: AC
  Administered 2019-05-10: 18:00:00 40 meq via ORAL
  Filled 2019-05-10: qty 2

## 2019-05-10 MED ORDER — RIVAROXABAN 15 MG PO TABS
15.0000 mg | ORAL_TABLET | Freq: Two times a day (BID) | ORAL | Status: DC
  Administered 2019-05-10 – 2019-05-11 (×3): 15 mg via ORAL
  Filled 2019-05-10 (×3): qty 1

## 2019-05-10 MED ORDER — CYCLOBENZAPRINE HCL 5 MG PO TABS
5.0000 mg | ORAL_TABLET | Freq: Three times a day (TID) | ORAL | Status: DC | PRN
  Administered 2019-05-11: 5 mg via ORAL
  Filled 2019-05-10: qty 1

## 2019-05-10 NOTE — Discharge Instructions (Signed)
Information on my medicine - XARELTO (rivaroxaban)  This medication education was reviewed with me or my healthcare representative as part of my discharge preparation.   WHY WAS XARELTO PRESCRIBED FOR YOU? Xarelto was prescribed to treat blood clots that may have been found in the veins of your legs (deep vein thrombosis) or in your lungs (pulmonary embolism) and to reduce the risk of them occurring again.  What do you need to know about Xarelto? The starting dose is one 15 mg tablet taken TWICE daily with food for the FIRST 21 DAYS then on after all the 15 mg tablets have been taken the dose is changed to one 20 mg tablet taken ONCE A DAY with your evening meal.  DO NOT stop taking Xarelto without talking to the health care provider who prescribed the medication.  Refill your prescription for 20 mg tablets before you run out.  After discharge, you should have regular check-up appointments with your healthcare provider that is prescribing your Xarelto.  In the future your dose may need to be changed if your kidney function changes by a significant amount.  What do you do if you miss a dose? If you are taking Xarelto TWICE DAILY and you miss a dose, take it as soon as you remember. You may take two 15 mg tablets (total 30 mg) at the same time then resume your regularly scheduled 15 mg twice daily the next day.  If you are taking Xarelto ONCE DAILY and you miss a dose, take it as soon as you remember on the same day then continue your regularly scheduled once daily regimen the next day. Do not take two doses of Xarelto at the same time.   Important Safety Information Xarelto is a blood thinner medicine that can cause bleeding. You should call your healthcare provider right away if you experience any of the following: ? Bleeding from an injury or your nose that does not stop. ? Unusual colored urine (red or dark brown) or unusual colored stools (red or black). ? Unusual bruising for  unknown reasons. ? A serious fall or if you hit your head (even if there is no bleeding).  Some medicines may interact with Xarelto and might increase your risk of bleeding while on Xarelto. To help avoid this, consult your healthcare provider or pharmacist prior to using any new prescription or non-prescription medications, including herbals, vitamins, non-steroidal anti-inflammatory drugs (NSAIDs) and supplements.  This website has more information on Xarelto: [email or web address]

## 2019-05-10 NOTE — Progress Notes (Signed)
[place] for heparin>>xarelto Indication:  RLE DVT, r/o PE  Allergies  Allergen Reactions  . Hydroxychloroquine Rash  . Latex Rash    Patient Measurements: Height: 5\' 3"  (160 cm) Weight: 125 lb 14.1 oz (57.1 kg) IBW/kg (Calculated) : 52.4 Heparin Dosing Weight: 63.5 kg  Vital Signs: Temp: 98.2 F (36.8 C) (08/28 0808) Temp Source: Oral (08/28 0400) BP: 170/117 (08/28 0915) Pulse Rate: 107 (08/28 0915)  Labs: Recent Labs    05/08/19 1329  05/09/19 0141 05/09/19 0748 05/09/19 0749 05/09/19 1758 05/10/19 0159 05/10/19 0414  HGB 10.2*  --  10.1* 9.9*  --   --  8.6*  --   HCT 33.0*  --  32.5* 31.1*  --   --  28.0*  --   PLT 190  --  189 124*  --   --  157  --   HEPARINUNFRC  --    < >  --   --  0.14* 0.19* 0.76* 0.67  CREATININE 0.76  --   --   --  0.60 0.73 0.64  --   CKTOTAL 35*  --   --   --   --   --   --   --   TROPONINIHS 4  --   --   --   --   --   --   --    < > = values in this interval not displayed.    Estimated Creatinine Clearance: 89.7 mL/min (by C-G formula based on SCr of 0.64 mg/dL).   Medical History: Past Medical History:  Diagnosis Date  . Gout   . Hypertension   . Lupus ([place])   . Raynaud disease     Medications: no anticoagulants PTA  Assessment: 23 yo F to start heparin per pharmacy for r/o PE/DVT.  Hg 10.2. PTLC 190. SCr 0.76. Wt 63.5 kg; D dimer > 20.   Doppler + for RLE DVT. CT angio neg for PE.   05/10/2019 To transition to Xarelto today Hg 9.6 - down from admit Hg of 10.2, PLTC 157. WNL No bleeding reported Cr 0.64   Plan:  DC heparin drip Xarelto 15 mg PO bid with food x 21 days then Xarelto 20 qsupper DC heparin labs(HL & CBC) Will give 30 day free card and educate pt  Eudelia Bunch, Pharm.D [phone] 05/10/2019 9:54 AM

## 2019-05-10 NOTE — Progress Notes (Signed)
ANTICOAGULATION CONSULT NOTE - Initial Consult  Pharmacy Consult for Heparin Indication: RLE DVT, r/o PE  Allergies  Allergen Reactions  . Hydroxychloroquine Rash  . Latex Rash    Patient Measurements: Height: 5\' 3"  (160 cm) Weight: 125 lb 14.1 oz (57.1 kg) IBW/kg (Calculated) : 52.4 Heparin Dosing Weight:   Vital Signs: Temp: 98.3 F (36.8 C) (08/28 0000) Temp Source: Oral (08/28 0000) BP: 140/91 (08/28 0100) Pulse Rate: 106 (08/28 0100)  Labs: Recent Labs    05/08/19 1329  05/09/19 0141 05/09/19 0748 05/09/19 0749 05/09/19 1758 05/10/19 0159  HGB 10.2*  --  10.1* 9.9*  --   --  8.6*  HCT 33.0*  --  32.5* 31.1*  --   --  28.0*  PLT 190  --  189 124*  --   --  157  HEPARINUNFRC  --    < >  --   --  0.14* 0.19* 0.76*  CREATININE 0.76  --   --   --  0.60 0.73  --   CKTOTAL 35*  --   --   --   --   --   --   TROPONINIHS 4  --   --   --   --   --   --    < > = values in this interval not displayed.    Estimated Creatinine Clearance: 89.7 mL/min (by C-G formula based on SCr of 0.73 mg/dL).   Medical History: Past Medical History:  Diagnosis Date  . Gout   . Hypertension   . Lupus ([place])   . Raynaud disease     Medications:  Infusions:  . ceFEPime (MAXIPIME) IV Stopped (05/09/19 2218)  . heparin 1,700 Units/hr (05/10/19 0128)  . lactated ringers 100 mL/hr at 05/10/19 0128    Assessment: Patient with high heparin level.  No heparin issues per RN.  Goal of Therapy:  Heparin level 0.3-0.7 units/ml Monitor platelets by anticoagulation protocol: Yes   Plan:  Decrease heparin to 1500 units/hr Recheck level at [place], Shea St[place] 05/10/2019,2:38 AM

## 2019-05-10 NOTE — Progress Notes (Signed)
PROGRESS NOTE    LANITRA [name]  [id number] DOB: Aug 19, 1996 DOA: 05/08/2019 PCP: [name], FNP   Brief Narrative:  [name] [name] is [name] [name] 23 y.o. female with medical history significant of lupus nephritis, h/oRaynaud's phenomenon, HTN, gout who presented with RLE swelling and was found to have DVT as well as SIRS with concern for UTI and sepsis.   Assessment & Plan:   Active Problems:   SLE (systemic lupus erythematosus) (HCC)   Essential hypertension   Sinus tachycardia   Acute lower UTI   Sepsis (HCC)   Right leg DVT (HCC)   Cardiomegaly   Systemic Inflammatory Response Syndrome: UA concerning for possible infection with WBC, RBC, +LE, few bacteria.  CXR and CT without evidence of infection.  Follow blood and urine cultures.   Fevers have improved Continue abx for possible UTI -> urine cx with <10,000 CFU Suspect fevers related to her extensive DVT Procalcitonin negative Will hold antibiotics and watch for recurrent fevers, follow cultures  Right leg DVT:  Extensive involving R femoral, popliteal, posterior tibial, peroneal, soleal, and gastroc veins.  Likely provoked in setting of her lupus.  Pt also with nephrotic range proteinuria on chart hx, low albumin, nephrotic syndrome also could contribute to risk of clot. Plan to transition to [place] today  RUE without VTE LLE without VTE  Sinus Tachycardia: follow echo (normal EF, normal RV systolic function - see report), no PE on CT scan.  Likely related to fevers.  Follow bolus and IVF.  TSH within normal limits.   SLE (systemic lupus erythematosus)   Lupus Nephritis  - prednisone continue at 40mg .  Discussed with rheum who recommended cellcept (but hold with concern for fever/infection above).  He notes prograf was started by nephrology, but he recently had discussion with nephrology and they were planning to transition her to cyclophosphamide as an outpatient.  Will continue steroids for now, hold prograf and cellcept.   Follow C3 (wnl), C4 (slightly low).   Of note, based on pharmacy research, her last rx for cellcept was April 2020 and pt did not pick this up.  She did have 5 day rx from one of the hospitalist on [facility] Pie[facility]ynski recent admission.  Also, concern that she has not picked up her prograf.  I was unable to call MD at [facility] today, but will need to pass along that it does not look like pt has been on cellcept/prograf consistently (as plans to transition to cyclophosphamide due to presumed failure)  Essential hypertension -restart Lopressor, increase dose.  Start amlodipine.  Labetalol prn.  Acute lower UTI -low-grade fever with occasional dysuria will await results of urine culture for now treat with cefepime given recent admission.  And immunocompromise state  Cardiomegaly   Concern for Pulm Artery Hypertension: follow echo (RVSP elevated to 36.9 mmHg), follow outpatient, this is similar to her last echo.  Low albumin will check prealbumin nutritional consult  Insomnia: trazodone  Trivial Circumferential Pericardial Effusion: continue to follow outpatient  Hypokalemia: replace and follow  DVT prophylaxis: heparin gtt Code Status: full  Family Communication: none - mother on phone Disposition Plan: pending further improvement   Consultants:   none  Procedures: Echo IMPRESSIONS    1. The left ventricle has normal systolic function, with an ejection fraction of 55-60%. The cavity size was normal. Indeterminate diastolic filling due to E-Shahida Schnackenberg fusion. No evidence of left ventricular regional wall motion abnormalities.  2. The right ventricle has normal systolic function. The cavity was normal. There  is no increase in right ventricular wall thickness. Right ventricular systolic pressure is mildly elevated with an estimated pressure of 36.9 mmHg.  3. Left atrial size was mildly dilated.  4. The pericardial effusion is circumferential.  5. Trivial pericardial effusion is present.  6. Mitral valve  regurgitation is mild to moderate by color flow Doppler. The MR jet is centrally-directed.  7. The aorta is normal unless otherwise noted.  8. The interatrial septum was not assessed.  LLE [place] Summary: Left: There is no evidence of deep vein thrombosis in the lower extremity. No cystic structure found in the popliteal fossa.  RUE [place] Right: No evidence of deep vein thrombosis in the upper extremity. No evidence of superficial vein thrombosis in the upper extremity.  LE [place]   Summary: Right: Findings consistent with acute deep vein thrombosis involving the right femoral vein, right popliteal vein, right posterior tibial veins, right peroneal veins, right soleal veins, and right gastrocnemius veins. No cystic structure found in the  popliteal fossa. Left: No evidence of common femoral vein obstruction.  Antimicrobials: Anti-infectives (From admission, onward)   Start     Dose/Rate Route Frequency Ordered Stop   05/08/19 2200  ceFEPIme (MAXIPIME) 2 g in sodium chloride 0.9 % 100 mL IVPB  Status:  Discontinued     2 g 200 mL/hr over 30 Minutes Intravenous Every 8 hours 05/08/19 2002 05/10/19 1718   05/08/19 1645  cefTRIAXone (ROCEPHIN) 1 g in sodium chloride 0.9 % 100 mL IVPB     1 g 200 mL/hr over 30 Minutes Intravenous  Once 05/08/19 1634 05/08/19 1819     Subjective: Feeling gradually better Mother on phone as well  Objective: Vitals:   05/10/19 1330 05/10/19 1441 05/10/19 1600 05/10/19 1630  BP: (!) 175/96  (!) 170/84 (!) 178/92  Pulse: (!) 108  (!) 113 (!) 114  Resp: 16  17 16   Temp:  98.6 F (37 C)    TempSrc:  Oral    SpO2: 100%  99% 99%  Weight:      Height:        Intake/Output Summary (Last 24 hours) at 05/10/2019 1719 Last data filed at 05/10/2019 1601 Gross per 24 hour  Intake 4093.15 ml  Output 1 ml  Net 4092.15 ml   Filed Weights   05/08/19 1145 05/08/19 2120  Weight: 63.5 kg 57.1 kg    Examination:  General: No acute distress. Cardiovascular:  Heart sounds show Joshawa Dubin tachycardic rate Lungs: Clear to auscultation bilaterally  Abdomen: Soft, nontender, nondistended  Neurological: Alert and oriented 3. Moves all extremities 4. Cranial nerves II through XII grossly intact. Skin: Warm and dry. No rashes or lesions. Extremities: No clubbing or cyanosis. No edema.   Data Reviewed: I have personally reviewed following labs and imaging studies  CBC: Recent Labs  Lab 05/08/19 1329 05/09/19 0141 05/09/19 0748 05/10/19 0159  WBC 5.6 2.2* 2.5* 2.3*  NEUTROABS 4.5  --  1.7  --   HGB 10.2* 10.1* 9.9* 8.6*  HCT 33.0* 32.5* 31.1* 28.0*  MCV 92.7 94.5 90.4 94.0  PLT 190 189 124* [id number]   Basic Metabolic Panel: Recent Labs  Lab 05/08/19 1329 05/09/19 0141 05/09/19 0749 05/09/19 1758 05/10/19 0159  NA 134*  --  133* 137 140  K 4.1  --  3.9 4.2 3.4*  CL 104  --  109 111 111  CO2 20*  --  16* 17* 18*  GLUCOSE 89  --  87 119* 93  BUN 15  --  13 15 15   CREATININE 0.76  --  0.60 0.73 0.64  CALCIUM 8.5*  --  7.8* 8.2* 8.1*  MG  --  1.7  --   --  1.7  PHOS  --  4.1  --   --   --    GFR: Estimated Creatinine Clearance: 89.7 mL/min (by C-G formula based on SCr of 0.64 mg/dL). Liver Function Tests: Recent Labs  Lab 05/08/19 1329 05/09/19 0749 05/10/19 0159  AST 33 29 27  ALT 20 18 15   ALKPHOS 106 74 70  BILITOT 0.4 0.3 0.1*  PROT 7.4 6.4* 5.9*  ALBUMIN 2.6* 2.1* 1.9*   No results for input(s): LIPASE, AMYLASE in the last 168 hours. No results for input(s): AMMONIA in the last 168 hours. Coagulation Profile: No results for input(s): INR, PROTIME in the last 168 hours. Cardiac Enzymes: Recent Labs  Lab 05/08/19 1329  CKTOTAL 35*   BNP (last 3 results) No results for input(s): PROBNP in the last 8760 hours. HbA1C: No results for input(s): HGBA1C in the last 72 hours. CBG: No results for input(s): GLUCAP in the last 168 hours. Lipid Profile: No results for input(s): CHOL, HDL, LDLCALC, TRIG, CHOLHDL, LDLDIRECT in the  last 72 hours. Thyroid Function Tests: Recent Labs    05/09/19 0141  TSH 3.309   Anemia Panel: No results for input(s): VITAMINB12, FOLATE, FERRITIN, TIBC, IRON, RETICCTPCT in the last 72 hours. Sepsis Labs: Recent Labs  Lab 05/08/19 1328 05/08/19 1547 05/08/19 2207 05/09/19 0141  PROCALCITON <0.10  --   --   --   LATICACIDVEN  --  1.1 1.2 1.1    Recent Results (from the past 240 hour(s))  Urine culture     Status: Abnormal   Collection Time: 05/08/19  3:24 PM   Specimen: Urine, Random  Result Value Ref Range Status   Specimen Description   Final    URINE, RANDOM Performed at [facility] [address] [place], [place] [address]    Special Requests   Final    NONE Performed at [facility], [place] [address] [place], [place] [address]    Culture (Laquetta Racey)  Final    <10,000 COLONIES/mL INSIGNIFICANT GROWTH Performed at [facility] [address], [place],  [address]    Report Status 05/09/2019 FINAL  Final  SARS Coronavirus 2 [facility] order, Performed in [facility] hospital lab) Nasopharyngeal Nasopharyngeal Swab     Status: None   Collection Time: 05/08/19  3:46 PM   Specimen: Nasopharyngeal Swab  Result Value Ref Range Status   SARS Coronavirus 2 NEGATIVE NEGATIVE Final    Comment: (NOTE) If result is NEGATIVE SARS-CoV-2 target nucleic acids are NOT DETECTED. The SARS-CoV-2 RNA is generally detectable in upper and lower  respiratory specimens during the acute phase of infection. The lowest  concentration of SARS-CoV-2 viral copies this assay can detect is 250  copies / mL. Gaelle Adriance negative result does not preclude SARS-CoV-2 infection  and should not be used as the sole basis for treatment or other  patient management decisions.  Itzamara Casas negative result may occur with  improper specimen collection / handling, submission of specimen other  than nasopharyngeal swab, presence of viral mutation(s) within the  areas targeted by  this assay, and inadequate number of viral copies  (<250 copies / mL). Dewain Platz negative result must be combined with clinical  observations, patient history, and epidemiological information. If result is POSITIVE SARS-CoV-2 target nucleic acids are DETECTED. The  SARS-CoV-2 RNA is generally detectable in upper and lower  respiratory specimens dur ing the acute phase of infection.  Positive  results are indicative of active infection with SARS-CoV-2.  Clinical  correlation with patient history and other diagnostic information is  necessary to determine patient infection status.  Positive results do  not rule out bacterial infection or co-infection with other viruses. If result is PRESUMPTIVE POSTIVE SARS-CoV-2 nucleic acids MAY BE PRESENT.   Chadwick Reiswig presumptive positive result was obtained on the submitted specimen  and confirmed on repeat testing.  While 2019 novel coronavirus  (SARS-CoV-2) nucleic acids may be present in the submitted sample  additional confirmatory testing may be necessary for epidemiological  and / or clinical management purposes  to differentiate between  SARS-CoV-2 and other Sarbecovirus currently known to infect humans.  If clinically indicated additional testing with an alternate test  methodology [phone]) is advised. The SARS-CoV-2 RNA is generally  detectable in upper and lower respiratory sp ecimens during the acute  phase of infection. The expected result is Negative. Fact Sheet for Patients:  StrictlyIdeas.no Fact Sheet for Healthcare Providers: BankingDealers.co.za This test is not yet approved or cleared by the [place] FDA and has been authorized for detection and/or diagnosis of SARS-CoV-2 by FDA under an Emergency Use Authorization (EUA).  This EUA will remain in effect (meaning this test can be used) for the duration of the COVID-19 declaration under Section 564(b)(1) of the Act, 21 U.S.C. section  360bbb-3(b)(1), unless the authorization is terminated or revoked sooner. Performed at [facility], [place] [address], [place], [place] [address]   Blood culture (routine x 2)     Status: None (Preliminary result)   Collection Time: 05/08/19  3:47 PM   Specimen: BLOOD  Result Value Ref Range Status   Specimen Description   Final    BLOOD RIGHT ANTECUBITAL Performed at [facility] [address], [place], [place] [address]    Special Requests   Final    BOTTLES DRAWN AEROBIC AND ANAEROBIC Blood Culture adequate volume Performed at [facility] [address] [place], [place] [address]    Culture   Final    NO GROWTH 2 DAYS Performed at [facility] [address], [place], [place] [address]    Report Status PENDING  Incomplete  Blood culture (routine x 2)     Status: None (Preliminary result)   Collection Time: 05/08/19  5:03 PM   Specimen: BLOOD  Result Value Ref Range Status   Specimen Description   Final    BLOOD BLOOD LEFT FOREARM Performed at [facility] [address], [place], [place] [address]    Special Requests   Final    BOTTLES DRAWN AEROBIC AND ANAEROBIC Blood Culture adequate volume Performed at [facility] [address], [place], [place] [address]    Culture   Final    NO GROWTH 2 DAYS Performed at [facility] [address], [place],  [address]    Report Status PENDING  Incomplete  MRSA PCR Screening     Status: None   Collection Time: 05/08/19  9:22 PM   Specimen: Nasal Mucosa; Nasopharyngeal  Result Value Ref Range Status   MRSA by PCR NEGATIVE NEGATIVE Final    Comment:        The GeneXpert MRSA Assay (FDA approved for NASAL specimens only), is one component of [facility] [place] comprehensive MRSA colonization surveillance program. It is  not intended to diagnose MRSA infection nor to guide or monitor treatment for MRSA  infections. Performed at [facility], [place] [address], [place], [place] [address]          Radiology Studies: Ct Angio Chest Pe W And/or Wo Contrast  Result Date: 05/08/2019 CLINICAL DATA:  Elevated D-dimer. Shortness of breath. History of lupus. Evaluate for pulmonary embolism. EXAM: CT ANGIOGRAPHY CHEST WITH CONTRAST TECHNIQUE: Multidetector CT imaging of the chest was performed using the standard protocol during bolus administration of intravenous contrast. Multiplanar CT image reconstructions and MIPs were obtained to evaluate the vascular anatomy. CONTRAST:  159mL OMNIPAQUE IOHEXOL 350 MG/ML SOLN COMPARISON:  Chest CT-06/10/2018; 05/30/2018 FINDINGS: Vascular Findings: There is adequate opacification of the pulmonary arterial system with the main pulmonary artery measuring 400 Hounsfield units. There are no discrete filling defects within the pulmonary arterial tree to suggest pulmonary embolism. Borderline enlarged caliber of the main pulmonary artery measuring 32 mm in diameter, similar to the 01/2019 examination. Borderline cardiomegaly. No pericardial effusion. No evidence of thoracic aortic aneurysm or dissection on this non gated examination. The left vertebral artery is incidentally noted to arise directly from the aortic arch. The branch vessels of the aortic arch appear patent throughout their imaged courses. Review of the MIP images confirms the above findings. ---------------------------------------------------------------------------------- Nonvascular Findings: Mediastinum/Lymph Nodes: No bulky mediastinal, hilar or axillary lymphadenopathy. Lungs/Pleura: Minimal nodular subsegmental atelectasis involving the medial basilar segment of the left lower lobe (image 90, series 11). Minimal dependent subpleural ground-glass atelectasis. No air bronchograms. No pleural effusion or pneumothorax. The central pulmonary airways appear widely patent. No discrete pulmonary  nodules. Upper abdomen: Limited early arterial phase evaluation of the upper abdomen is normal. Musculoskeletal: No acute or aggressive osseous abnormalities. Regional soft tissues appear normal. Normal appearance of the thyroid gland. IMPRESSION: 1. No acute cardiopulmonary disease. Specifically, no evidence of pulmonary embolism. 2. Redemonstrated borderline cardiomegaly enlargement of the caliber of the main pulmonary artery, nonspecific though could be seen in the setting of pulmonary arterial hypertension. Further evaluation with cardiac echo could performed as indicated. Electronically Signed   By: Sandi Mariscal M.D.   On: 05/08/2019 17:31   Vas [place] Lower Extremity Venous (dvt)  Result Date: 05/09/2019  Lower Venous Study Indications: RLE DVT, no pain or swelling of LLE.  Anticoagulation: Heparin. Comparison Study: 05/08/19 RLE + DVT Performing Technologist: June Leap RDMS, RVT  Examination Guidelines: Denvil Canning complete evaluation includes B-mode imaging, spectral Doppler, color Doppler, and power Doppler as needed of all accessible portions of each vessel. Bilateral testing is considered an integral part of Luken Shadowens complete examination. Limited examinations for reoccurring indications may be performed as noted.  +---------+---------------+---------+-----------+----------+--------------+  LEFT      Compressibility Phasicity Spontaneity Properties Thrombus Aging  +---------+---------------+---------+-----------+----------+--------------+  CFV       Full            Yes       Yes                                    +---------+---------------+---------+-----------+----------+--------------+  SFJ       Full                                                             +---------+---------------+---------+-----------+----------+--------------+  FV Prox   Full                                                             +---------+---------------+---------+-----------+----------+--------------+  FV Mid    Full                                                              +---------+---------------+---------+-----------+----------+--------------+  FV Distal Full                                                             +---------+---------------+---------+-----------+----------+--------------+  PFV       Full                                                             +---------+---------------+---------+-----------+----------+--------------+  POP       Full            Yes       Yes                                    +---------+---------------+---------+-----------+----------+--------------+  PTV       Full                                                             +---------+---------------+---------+-----------+----------+--------------+  PERO      Full                                                             +---------+---------------+---------+-----------+----------+--------------+     Summary: Left: There is no evidence of deep vein thrombosis in the lower extremity. No cystic structure found in the popliteal fossa.  *See table(s) above for measurements and observations. Electronically signed by Monica Martinez MD on 05/09/2019 at 5:14:41 PM.    Final    Vas [place] Upper Extremity Venous Duplex  Result Date: 05/09/2019 UPPER VENOUS STUDY  Indications: Edema Comparison Study: no prior Performing Technologist: June Leap RDMS, RVT  Examination Guidelines: Eriel Doyon complete evaluation includes B-mode imaging, spectral Doppler, color Doppler, and power Doppler as needed of all accessible portions of each vessel. Bilateral testing is considered an integral part of Emmely Bittinger complete examination. Limited examinations for reoccurring indications may be performed as noted.  Right Findings: +----------+------------+---------+-----------+----------+--------------+  RIGHT      Compressible Phasicity Spontaneous Properties    Summary      +----------+------------+---------+-----------+----------+--------------+  IJV            Full        Yes        Yes                                 +----------+------------+---------+-----------+----------+--------------+  Subclavian     Full        Yes        Yes                                +----------+------------+---------+-----------+----------+--------------+  Axillary       Full        Yes        Yes                                +----------+------------+---------+-----------+----------+--------------+  Brachial       Full        Yes        Yes                                +----------+------------+---------+-----------+----------+--------------+  Radial         Full                                                      +----------+------------+---------+-----------+----------+--------------+  Ulnar          Full                                                      +----------+------------+---------+-----------+----------+--------------+  Cephalic       Full                                                      +----------+------------+---------+-----------+----------+--------------+  Basilic                                                  Not visualized  +----------+------------+---------+-----------+----------+--------------+  Left Findings: +----+------------+---------+-----------+----------+--------------+  LEFT Compressible Phasicity Spontaneous Properties    Summary      +----+------------+---------+-----------+----------+--------------+  IJV                                                Not visualized  +----+------------+---------+-----------+----------+--------------+  Summary:  Right: No evidence of deep vein thrombosis in the upper extremity. No evidence of superficial vein thrombosis in the upper extremity.  *See  table(s) above for measurements and observations.  Diagnosing physician: Monica Martinez MD Electronically signed by Monica Martinez MD on 05/09/2019 at 5:14:24 PM.    Final         Scheduled Meds:  amLODipine  5 mg Oral Daily   buPROPion  150 mg Oral Daily   Chlorhexidine Gluconate Cloth  6  each Topical Daily   feeding supplement  1 Container Oral BID BM   feeding supplement (PRO-STAT SUGAR FREE 64)  30 mL Oral BID BM   ferrous sulfate  325 mg Oral BID   gabapentin  300 mg Oral TID   metoprolol tartrate  12.5 mg Oral BID   multivitamin with minerals  1 tablet Oral Daily   pantoprazole  20 mg Oral QHS   predniSONE  40 mg Oral Q breakfast   Rivaroxaban  15 mg Oral BID WC   traZODone  100 mg Oral QHS   Continuous Infusions:    LOS: 2 days    Time spent: over 30 min    Fayrene Helper, MD [facility] Pager AMION  If 7PM-7AM, please contact night-coverage [email or web address] Password [facility] 05/10/2019, 5:19 PM

## 2019-05-11 LAB — COMPREHENSIVE METABOLIC PANEL
ALT: 16 U/L (ref 0–44)
AST: 26 U/L (ref 15–41)
Albumin: 2.2 g/dL — ABNORMAL LOW (ref 3.5–5.0)
Alkaline Phosphatase: 91 U/L (ref 38–126)
Anion gap: 7 (ref 5–15)
BUN: 18 mg/dL (ref 6–20)
CO2: 22 mmol/L (ref 22–32)
Calcium: 8.8 mg/dL — ABNORMAL LOW (ref 8.9–10.3)
Chloride: 109 mmol/L (ref 98–111)
Creatinine, Ser: 0.55 mg/dL (ref 0.44–1.00)
GFR calc Af Amer: >60 mL/min (ref >60)
GFR calc non Af Amer: >60 mL/min (ref >60)
Glucose, Bld: 77 mg/dL (ref 70–99)
Potassium: 4.1 mmol/L (ref 3.5–5.1)
Sodium: 138 mmol/L (ref 135–145)
Total Bilirubin: 0.3 mg/dL (ref 0.3–1.2)
Total Protein: 6.7 g/dL (ref 6.5–8.1)

## 2019-05-11 LAB — MAGNESIUM: Magnesium: 2 mg/dL (ref 1.7–2.4)

## 2019-05-11 LAB — CBC
HCT: 28.8 % — ABNORMAL LOW (ref 36.0–46.0)
Hemoglobin: 9 g/dL — ABNORMAL LOW (ref 12.0–15.0)
MCH: 28.9 pg (ref 26.0–34.0)
MCHC: 31.3 g/dL (ref 30.0–36.0)
MCV: 92.6 fL (ref 80.0–100.0)
Platelets: 212 10*3/uL (ref 150–400)
RBC: 3.11 MIL/uL — ABNORMAL LOW (ref 3.87–5.11)
RDW: 15.8 % — ABNORMAL HIGH (ref 11.5–15.5)
WBC: 3.2 10*3/uL — ABNORMAL LOW (ref 4.0–10.5)
nRBC: 0 % (ref 0.0–0.2)

## 2019-05-11 MED ORDER — AMLODIPINE BESYLATE 10 MG PO TABS: 10.0000 mg | ORAL_TABLET | Freq: Every day | ORAL | Status: DC

## 2019-05-11 MED ORDER — HYDROCODONE-ACETAMINOPHEN 5-325 MG PO TABS: 1.0000 | ORAL_TABLET | Freq: Four times a day (QID) | ORAL | 0 refills | Status: AC | PRN

## 2019-05-11 MED ORDER — METOPROLOL TARTRATE 25 MG PO TABS: 25.0000 mg | ORAL_TABLET | Freq: Two times a day (BID) | ORAL | 0 refills | Status: DC

## 2019-05-11 MED ORDER — AMLODIPINE BESYLATE 5 MG PO TABS
5.0000 mg | ORAL_TABLET | Freq: Once | ORAL | Status: AC
  Administered 2019-05-11: 5 mg via ORAL
  Filled 2019-05-11: qty 1

## 2019-05-11 MED ORDER — PREDNISONE 10 MG PO TABS: ORAL_TABLET | ORAL | 0 refills | Status: AC

## 2019-05-11 MED ORDER — RIVAROXABAN (XARELTO) VTE STARTER PACK (15 & 20 MG): ORAL_TABLET | ORAL | 0 refills | Status: DC

## 2019-05-11 MED ORDER — ADULT MULTIVITAMIN W/MINERALS CH: 1.0000 | ORAL_TABLET | Freq: Every day | ORAL | 0 refills | Status: AC

## 2019-05-11 MED ORDER — TRAZODONE HCL 100 MG PO TABS: 100.0000 mg | ORAL_TABLET | Freq: Every day | ORAL | 0 refills | Status: DC

## 2019-05-11 NOTE — Progress Notes (Signed)
PT Note  Patient Details Name: [name] [name] MRN: [id number] DOB: December 14, 1995     Eval and assessment complete, full write up to follow. Spent a long time with pt trying to learn more about PLOF and helping to answer a lot of questions she had. She states she has not been doing great since she left in mid July, not being able to move a lot and then she understands that that cycle likely caused more pain and these clots. She stays in a 2 level home with her sisters, but will discharge to her grandmother's home which is 1 story and her grandmother and sisters will still assist if needed. She was completely independent at RW level, but currently uses WC if needed and bedside commode.  She is able to return home at this level and would recommend HHPT to work with her to progress her back to her PLOF prior to July.  She had a lot of questions about her clots and was really holding the RLE stiff and flexed with worry " they will dislodge into my lungs or heart if I move" . If the MD could help address this question and concern that would be great.  I encourage slow, easy functional movment at this time.   BP still at 130/118 and HR sitting EOB at 124-130 bpm.     Rocket Gunderson 05/11/2019, 12:38 PM  Clide Dales, PT Acute Rehabilitation Services Pager: [phone] Office: [phone] 05/11/2019

## 2019-05-11 NOTE — Discharge Summary (Addendum)
Physician Discharge Summary  [name] [name] [id number] DOB: 1996/01/04 DOA: 05/08/2019  PCP: [name] Boast, FNP  Admit date: 05/08/2019 Discharge date: 05/11/2019  Time spent: 40 minutes  Recommendations for Outpatient Follow-up:  1. Follow outpatient CBC/CMP 2. Follow up steroids with rheumatology/nephrology - if on >20 mg prednisone a day with another immunosuppressive drug for extended period of time, consider PCP prophylaxis 3. Recommended 3-6 months uninterrupted anticoagulation for RLE DVT.  After that, she'd likely benefit from lifelong anticoagulation or until risk outweighs benefit as this was in setting of lupus/nephrotic syndrome.  Follow with PCP for further recs. 4. Consider cardiology follow up for persistent sinus tachycardia and mild pulm hypertension 5. Regarding cellcept and tacrolimus, our pharmacist when checking the [facility] did not see that she'd been consistently picking these meds up.  Plan for IV cytoxan based on presumed failure of cellcept/prograf per [facility] nephrology/rheum.  See below for pharm note. 6. Follow blood pressure outpatient    Discharge Diagnoses:  Active Problems:   SLE (systemic lupus erythematosus) (HCC)   Essential hypertension   Sinus tachycardia   Acute lower UTI   Sepsis ([place])   Right leg DVT ([place])   Cardiomegaly   Discharge Condition: stable  Diet recommendation: heart healthy  Filed Weights   05/08/19 1145 05/08/19 2120 05/10/19 1852  Weight: 63.5 kg 57.1 kg 59.5 kg    History of present illness:  [name] [name] a 23 y.o.femalewith medical history significant of lupus nephritis,h/oRaynaud's phenomenon,HTN, gout who presented with RLE swelling and was found to have DVT as well as SIRS with concern for UTI and sepsis.  She was found to have extensive RLE DVT.  She was initially on abx for concern for infection, but cultures were negative.  She was transitioned to xarelto and discharged on 8/29.    See  below for further details.  Hospital Course:  Right leg DVT:  Extensive involving R femoral, popliteal, posterior tibial, peroneal, soleal, and gastroc veins.  Likely provoked in setting of her lupus.  Pt also with nephrotic range proteinuria on chart hx, low albumin, nephrotic syndrome also could contribute to risk of clot.  Instructed to d/c depo provera and discuss birth control with provider with her DVT. Discharged on [place] without VTE LLE without VTE Discussed with vascular over phone who recommended thigh high compression stocking to help prevent post thrombotic syndrome  Systemic Inflammatory Response Syndrome: UA concerning for possible infection with WBC, RBC, +LE, few bacteria.  CXR and CT without evidence of infection.  Follow blood and urine cultures.   Fevers have improved Continue abx for possible UTI -> urine cx with <10,000 CFU Suspect fevers related to her extensive DVT Procalcitonin negative Will hold antibiotics and watch for recurrent fevers, follow cultures  Sinus Tachycardia: follow echo (normal EF, normal RV systolic function - see report), no PE on CT scan.  Likely related to fevers.  Follow bolus and IVF.  TSH within normal limits.  Tachy had improved yesterday, but in the 110's and 120's today, though she's asx.  Discussed informally with cards, who did not recommend additional w/u or uptitration of beta blocker at this time. Follow with cardiology outpatient  SLE (systemic lupus erythematosus)  Lupus Nephritis - prednisone continue at 40mg .  Discussed with rheum who recommended cellcept (but hold with concern for fever/infection above).  He notes prograf was started by nephrology, but he recently had discussion with nephrology and they were planning to transition her to cyclophosphamide  as an outpatient.  Will continue steroids for now, hold prograf and cellcept.  Follow C3 (wnl), C4 (slightly low).   Of note, based on pharmacy research, her last rx for  cellcept was April 2020 and pt did not pick this up.  She did have 5 day rx from one of the hospitalist on a recent admission.  Also, concern that she has not picked up her prograf.  I was unable to call MD at [facility], but will need to pass along that it does not look like pt has been on cellcept/prograf consistently (as plans to transition to cyclophosphamide due to presumed failure).  Follow outpatient.  Will route this to nephrology and rheum.  Note per pharmacy "Pharmacy: Cellcept/Prograf clarification  Pt states she takes Cellcept (mycophenolate) and not Prograf (tacrolimus) but seemed uncertain (told Dr Florene Glen she take Prograf and not Cellcept).  [facility] [address] [facility] was in April 2020 and pt did NOT pick up Rx.  She did have a 5 day Rx for Cellcept from Dr [place] Crane of [facility] from recent admission.  She also has not picked up her Rx for Prograf for Prograf. [facility] pharmacist states cellcept has been on backorder and difficult to obtain.  [facility] [phone] - RN thinks she failed cellcept/prograft and rec to try IV Cytoxan.  Unable to reach MD after call x 2."  Essential hypertension-restart Lopressor, increase dose.  Start amlodipine.    Cardiomegaly  Concern for Pulm Artery Hypertension: follow echo (RVSP elevated to 36.9 mmHg), follow outpatient, this is similar to her last echo.  Consider cards follow outpatient.  Low albuminwill check prealbumin nutritional consult  Insomnia: trazodone  Trivial Circumferential Pericardial Effusion: continue to follow outpatient.  No CP or EKG findings typical for pericarditis.  Discussed this informally with cards as well.  Follow outpatient.  Hypokalemia: replace and follow  Attempted to obtain home health, but pt without insurance  Procedures: Echo IMPRESSIONS    1. The left ventricle has normal systolic function, with an ejection fraction of 55-60%.  The cavity size was normal. Indeterminate diastolic filling due to E-A fusion. No evidence of left ventricular regional wall motion abnormalities.  2. The right ventricle has normal systolic function. The cavity was normal. There is no increase in right ventricular wall thickness. Right ventricular systolic pressure is mildly elevated with an estimated pressure of 36.9 mmHg.  3. Left atrial size was mildly dilated.  4. The pericardial effusion is circumferential.  5. Trivial pericardial effusion is present.  6. Mitral valve regurgitation is mild to moderate by color flow Doppler. The MR jet is centrally-directed.  7. The aorta is normal unless otherwise noted.  8. The interatrial septum was not assessed.  RLE [place] Summary: Right: Findings consistent with acute deep vein thrombosis involving the right femoral vein, right popliteal vein, right posterior tibial veins, right peroneal veins, right soleal veins, and right gastrocnemius veins. No cystic structure found in the  popliteal fossa. Left: No evidence of common femoral vein obstruction.  LLE [place] Summary: Left: There is no evidence of deep vein thrombosis in the lower extremity. No cystic structure found in the popliteal fossa.  RUE [place] Summary:   Right: No evidence of deep vein thrombosis in the upper extremity. No evidence of superficial vein thrombosis in the upper extremity.  Consultations:  none  Discharge Exam: Vitals:   05/11/19 1315 05/11/19 1511  BP: (!) 156/104 (!) 130/118  Pulse: (!) 115 (!) 123  Resp:    Temp: 98.9 F (37.2 C)   SpO2:  96%   Feels better Ready to go home Long discussion with sister leslie, asking about repeat US.  Discussed no indication at this time.  Discussed return precautions.  General: No acute distress. Cardiovascular: Heart sounds show a tachycardic rate Lungs: Clear to auscultation bilaterally Abdomen: Soft, nontender, nondistended  Neurological: Alert and oriented 3. Moves all  extremities 4. Cranial nerves II through XII grossly intact. Skin: Warm and dry. No rashes or lesions. Extremities: No clubbing or cyanosis. No edema.    Discharge Instructions   Discharge Instructions    Call MD for:  difficulty breathing, headache or visual disturbances   Complete by: As directed    Call MD for:  extreme fatigue   Complete by: As directed    Call MD for:  persistant dizziness or light-headedness   Complete by: As directed    Call MD for:  persistant nausea and vomiting   Complete by: As directed    Call MD for:  redness, tenderness, or signs of infection (pain, swelling, redness, odor or green/yellow discharge around incision site)   Complete by: As directed    Call MD for:  severe uncontrolled pain   Complete by: As directed    Call MD for:  temperature >100.4   Complete by: As directed    Diet - low sodium heart healthy   Complete by: As directed    Discharge instructions   Complete by: As directed    You were seen for a DVT.  You've improved after starting a blood thinner.  We're going to discharge you on xarelto.  We'll give you a 30 day free card at discharge.  You should follow up at the [place] [facility] wellness center for further prescriptions.  Because I think this blood clot is related to your lupus, you should continue the blood thinner uninterrupted for at least 3-6 months, then likely indefinitely after this (or until the risk outweighs the benefit).  Please continue to discuss this further with your PCP.  I would recommend discontinuing your depo provera for now and discussing birth control with your PCP.    Your heart rate is a little fast and you had a small amount of fluid around the heart.  Please continue to follow up with your PCP and outpatient providers.  I think you would benefit from cardiology follow up as an outpatient as you also have some pulmonary hypertension as well.  Follow up with your nephrologist and your rheumatologist as  planned.  Please continue your steroid taper as prescribed.  You may need prophylactic antibiotics if you continue to be on steroids for a prolonged period of time.  Return for new, recurrent, or worsening symptoms.  Please ask your PCP to request records from this hospitalization so they know what was done and what the next steps will be.   Increase activity slowly   Complete by: As directed      Allergies as of 05/11/2019      Reactions   Hydroxychloroquine Rash   Latex Rash      Medication List    STOP taking these medications   DULoxetine 30 MG capsule Commonly known as: CYMBALTA   DULoxetine 60 MG capsule Commonly known as: CYMBALTA   medroxyPROGESTERone 150 MG/ML injection Commonly known as: DEPO-PROVERA     TAKE these medications   amLODipine 10 MG tablet Commonly known as: NORVASC  Take 1 tablet (10 mg total) by mouth daily. Notes to patient: 05/12/2019    buPROPion 150 MG 24 hr tablet Commonly known as: WELLBUTRIN XL Take 1 tablet (150 mg total) by mouth daily. Notes to patient: 05/12/2019    cyclobenzaprine 10 MG tablet Commonly known as: FLEXERIL Take 1 tablet (10 mg total) by mouth 2 (two) times daily as needed for muscle spasms. Notes to patient: As needed    diphenhydrAMINE 25 MG tablet Commonly known as: BENADRYL Take 25 mg by mouth every 6 (six) hours as needed for allergies.   ferrous sulfate 325 (65 FE) MG tablet Take 1 tablet (325 mg total) by mouth 2 (two) times daily. With 1/2 glass of orange juice Notes to patient: 05/11/2019 evening meal    folic acid 1 MG tablet Commonly known as: FOLVITE Take 1 tablet (1 mg total) by mouth at bedtime. What changed: when to take this Notes to patient: 05/11/2019    gabapentin 300 MG capsule Commonly known as: NEURONTIN Take 1 capsule (300 mg total) by mouth 3 (three) times daily. Notes to patient: 05/11/2019 one more dose    HYDROcodone-acetaminophen 5-325 MG tablet Commonly known as:  NORCO/VICODIN Take 1-2 tablets by mouth every 6 (six) hours as needed for up to 3 days for moderate pain. Notes to patient: 05/11/2019 after 4 p.m.    loperamide 2 MG capsule Commonly known as: IMODIUM Take 1 capsule (2 mg total) by mouth as needed for diarrhea or loose stools.   metoprolol tartrate 25 MG tablet Commonly known as: LOPRESSOR Take 1 tablet (25 mg total) by mouth 2 (two) times daily. What changed: how much to take Notes to patient: 05/12/2019    multivitamin with minerals Tabs tablet Take 1 tablet by mouth daily. Start taking on: May 12, 2019 Notes to patient: 05/12/2019    mupirocin ointment 2 % Commonly known as: Bactroban Place 1 application into the nose 2 (two) times daily.   nystatin 100000 UNIT/ML suspension Commonly known as: MYCOSTATIN Take 5 mLs (500,000 Units total) by mouth 2 (two) times daily as needed. What changed: reasons to take this   pantoprazole 20 MG tablet Commonly known as: PROTONIX Take 1 tablet (20 mg total) by mouth at bedtime. Notes to patient: 05/11/2019    predniSONE 10 MG tablet Commonly known as: DELTASONE Take 4 tablets (40 mg total) by mouth daily with breakfast for 7 days, THEN 3 tablets (30 mg total) daily with breakfast for 7 days, THEN 2 tablets (20 mg total) daily with breakfast for 14 days. (discuss continued dosing with your rheumatologist in follow up). Start taking on: May 12, 2019 What changed: See the new instructions. Notes to patient: 05/12/2019    Rivaroxaban 15 & 20 MG Tbpk Take as directed on package: Start with one 15mg  tablet by mouth twice a day with food. On Day 22, switch to one 20mg  tablet once a day with food. Notes to patient: 15 mg 05/11/2019 evening    traZODone 100 MG tablet Commonly known as: DESYREL Take 1 tablet (100 mg total) by mouth at bedtime. Notes to patient: 05/11/2019 bedtime    Vitamin D (Ergocalciferol) 1.25 MG (50000 UT) Caps capsule Commonly known as: DRISDOL Take 1 capsule  (50,000 Units total) by mouth 2 (two) times a week.      Allergies  Allergen Reactions  . Hydroxychloroquine Rash  . Latex Rash      The results of significant diagnostics from this hospitalization (including imaging, microbiology, ancillary and laboratory)  are listed below for reference.    Significant Diagnostic Studies: Ct Soft Tissue Neck W Contrast  Result Date: 04/19/2019 CLINICAL DATA:  Facial swelling over the last 3 days. Rash. Anterior and submandibular pain. EXAM: CT NECK WITH CONTRAST TECHNIQUE: Multidetector CT imaging of the neck was performed using the standard protocol following the bolus administration of intravenous contrast. CONTRAST:  77mL OMNIPAQUE IOHEXOL 300 MG/ML  SOLN COMPARISON:  None. FINDINGS: Pharynx and larynx: No mucosal or submucosal lesion is seen. No evidence of airway compromise. Salivary glands: Parotid and submandibular glands are normal. Submandibular glands are diminutive but do not show inflammation or a focal lesion. Thyroid: Normal Lymph nodes: No enlarged or low-density nodes on either side of the neck. Normal bilateral nodes. Vascular: Normal Limited intracranial: Normal Visualized orbits: Normal Mastoids and visualized paranasal sinuses: Clear Skeleton: Normal. No evidence of advanced dental or periodontal disease. Upper chest: Normal Other: Nonspecific edema pattern within the subcutaneous fat of the lower face and upper neck. No evidence of abscess. IMPRESSION: Nonspecific edema pattern within the subcutaneous fat of the lower face and upper neck. This could be due to cellulitis or nonspecific edema. No sign of abscess or lymphadenopathy. No sign of airway compromise. Electronically Signed   By: Nelson Chimes M.D.   On: 04/19/2019 12:34   Ct Angio Chest Pe W And/or Wo Contrast  Result Date: 05/08/2019 CLINICAL DATA:  Elevated D-dimer. Shortness of breath. History of lupus. Evaluate for pulmonary embolism. EXAM: CT ANGIOGRAPHY CHEST WITH CONTRAST  TECHNIQUE: Multidetector CT imaging of the chest was performed using the standard protocol during bolus administration of intravenous contrast. Multiplanar CT image reconstructions and MIPs were obtained to evaluate the vascular anatomy. CONTRAST:  184mL OMNIPAQUE IOHEXOL 350 MG/ML SOLN COMPARISON:  Chest CT-06/10/2018; 05/30/2018 FINDINGS: Vascular Findings: There is adequate opacification of the pulmonary arterial system with the main pulmonary artery measuring 400 Hounsfield units. There are no discrete filling defects within the pulmonary arterial tree to suggest pulmonary embolism. Borderline enlarged caliber of the main pulmonary artery measuring 32 mm in diameter, similar to the 01/2019 examination. Borderline cardiomegaly. No pericardial effusion. No evidence of thoracic aortic aneurysm or dissection on this non gated examination. The left vertebral artery is incidentally noted to arise directly from the aortic arch. The branch vessels of the aortic arch appear patent throughout their imaged courses. Review of the MIP images confirms the above findings. ---------------------------------------------------------------------------------- Nonvascular Findings: Mediastinum/Lymph Nodes: No bulky mediastinal, hilar or axillary lymphadenopathy. Lungs/Pleura: Minimal nodular subsegmental atelectasis involving the medial basilar segment of the left lower lobe (image 90, series 11). Minimal dependent subpleural ground-glass atelectasis. No air bronchograms. No pleural effusion or pneumothorax. The central pulmonary airways appear widely patent. No discrete pulmonary nodules. Upper abdomen: Limited early arterial phase evaluation of the upper abdomen is normal. Musculoskeletal: No acute or aggressive osseous abnormalities. Regional soft tissues appear normal. Normal appearance of the thyroid gland. IMPRESSION: 1. No acute cardiopulmonary disease. Specifically, no evidence of pulmonary embolism. 2. Redemonstrated  borderline cardiomegaly enlargement of the caliber of the main pulmonary artery, nonspecific though could be seen in the setting of pulmonary arterial hypertension. Further evaluation with cardiac echo could performed as indicated. Electronically Signed   By: Sandi Mariscal M.D.   On: 05/08/2019 17:31   Dg Chest Portable 1 View  Result Date: 05/08/2019 CLINICAL DATA:  Shortness of breath EXAM: PORTABLE CHEST 1 VIEW COMPARISON:  04/19/2019 FINDINGS: Heart and mediastinal contours are within normal limits. No focal opacities or effusions. No acute  bony abnormality. IMPRESSION: No active disease. Electronically Signed   By: Rolm Baptise M.D.   On: 05/08/2019 15:18   Dg Chest Portable 1 View  Result Date: 04/19/2019 CLINICAL DATA:  Shortness of breath EXAM: PORTABLE CHEST 1 VIEW COMPARISON:  March 25, 2019 FINDINGS: The lungs are clear. Heart size and pulmonary vascularity are normal. No adenopathy. No pneumothorax. No bone lesions. IMPRESSION: No edema or consolidation. Electronically Signed   By: Lowella Grip III M.D.   On: 04/19/2019 10:16   Vas [place] Lower Extremity Venous (dvt)  Result Date: 05/09/2019  Lower Venous Study Indications: RLE DVT, no pain or swelling of LLE.  Anticoagulation: Heparin. Comparison Study: 05/08/19 RLE + DVT Performing Technologist: June Leap RDMS, RVT  Examination Guidelines: A complete evaluation includes B-mode imaging, spectral Doppler, color Doppler, and power Doppler as needed of all accessible portions of each vessel. Bilateral testing is considered an integral part of a complete examination. Limited examinations for reoccurring indications may be performed as noted.  +---------+---------------+---------+-----------+----------+--------------+ LEFT     CompressibilityPhasicitySpontaneityPropertiesThrombus Aging +---------+---------------+---------+-----------+----------+--------------+ CFV      Full           Yes      Yes                                  +---------+---------------+---------+-----------+----------+--------------+ SFJ      Full                                                        +---------+---------------+---------+-----------+----------+--------------+ FV Prox  Full                                                        +---------+---------------+---------+-----------+----------+--------------+ FV Mid   Full                                                        +---------+---------------+---------+-----------+----------+--------------+ FV DistalFull                                                        +---------+---------------+---------+-----------+----------+--------------+ PFV      Full                                                        +---------+---------------+---------+-----------+----------+--------------+ POP      Full           Yes      Yes                                 +---------+---------------+---------+-----------+----------+--------------+  PTV      Full                                                        +---------+---------------+---------+-----------+----------+--------------+ PERO     Full                                                        +---------+---------------+---------+-----------+----------+--------------+     Summary: Left: There is no evidence of deep vein thrombosis in the lower extremity. No cystic structure found in the popliteal fossa.  *See table(s) above for measurements and observations. Electronically signed by Monica Martinez MD on 05/09/2019 at 5:14:41 PM.    Final    Vas [place] Lower Extremity Venous (dvt) (only [place])  Result Date: 05/09/2019  Lower Venous Study Indications: Swelling.  Risk Factors: None identified. Comparison Study: 03/22/2019 - Negative for DVT. Performing Technologist: Oliver Hum RVT  Examination Guidelines: A complete evaluation includes B-mode imaging, spectral Doppler, color Doppler, and power Doppler as  needed of all accessible portions of each vessel. Bilateral testing is considered an integral part of a complete examination. Limited examinations for reoccurring indications may be performed as noted.  +---------+---------------+---------+-----------+----------+--------------+ RIGHT    CompressibilityPhasicitySpontaneityPropertiesThrombus Aging +---------+---------------+---------+-----------+----------+--------------+ CFV      Full           Yes      Yes                                 +---------+---------------+---------+-----------+----------+--------------+ SFJ      Full                                                        +---------+---------------+---------+-----------+----------+--------------+ FV Prox  Full                                                        +---------+---------------+---------+-----------+----------+--------------+ FV Mid   None           No       No                   Acute          +---------+---------------+---------+-----------+----------+--------------+ FV DistalNone           No       No                   Acute          +---------+---------------+---------+-----------+----------+--------------+ PFV      Full                                                        +---------+---------------+---------+-----------+----------+--------------+  POP      None           No       No                   Acute          +---------+---------------+---------+-----------+----------+--------------+ PTV      Partial                                      Acute          +---------+---------------+---------+-----------+----------+--------------+ PERO     Partial                                      Acute          +---------+---------------+---------+-----------+----------+--------------+ Soleal   None                                         Acute           +---------+---------------+---------+-----------+----------+--------------+ Gastroc  None                                         Acute          +---------+---------------+---------+-----------+----------+--------------+   +----+---------------+---------+-----------+----------+--------------+ LEFTCompressibilityPhasicitySpontaneityPropertiesThrombus Aging +----+---------------+---------+-----------+----------+--------------+ CFV Full           Yes      Yes                                 +----+---------------+---------+-----------+----------+--------------+     Summary: Right: Findings consistent with acute deep vein thrombosis involving the right femoral vein, right popliteal vein, right posterior tibial veins, right peroneal veins, right soleal veins, and right gastrocnemius veins. No cystic structure found in the popliteal fossa. Left: No evidence of common femoral vein obstruction.  *See table(s) above for measurements and observations. Electronically signed by Harold Barban MD on 05/09/2019 at 7:33:45 AM.    Final    Vas [place] Upper Extremity Venous Duplex  Result Date: 05/09/2019 UPPER VENOUS STUDY  Indications: Edema Comparison Study: no prior Performing Technologist: June Leap RDMS, RVT  Examination Guidelines: A complete evaluation includes B-mode imaging, spectral Doppler, color Doppler, and power Doppler as needed of all accessible portions of each vessel. Bilateral testing is considered an integral part of a complete examination. Limited examinations for reoccurring indications may be performed as noted.  Right Findings: +----------+------------+---------+-----------+----------+--------------+ RIGHT     CompressiblePhasicitySpontaneousProperties   Summary     +----------+------------+---------+-----------+----------+--------------+ IJV           Full       Yes       Yes                              +----------+------------+---------+-----------+----------+--------------+ Subclavian    Full       Yes       Yes                             +----------+------------+---------+-----------+----------+--------------+  Axillary      Full       Yes       Yes                             +----------+------------+---------+-----------+----------+--------------+ Brachial      Full       Yes       Yes                             +----------+------------+---------+-----------+----------+--------------+ Radial        Full                                                 +----------+------------+---------+-----------+----------+--------------+ Ulnar         Full                                                 +----------+------------+---------+-----------+----------+--------------+ Cephalic      Full                                                 +----------+------------+---------+-----------+----------+--------------+ Basilic                                             Not visualized +----------+------------+---------+-----------+----------+--------------+  Left Findings: +----+------------+---------+-----------+----------+--------------+ LEFTCompressiblePhasicitySpontaneousProperties   Summary     +----+------------+---------+-----------+----------+--------------+ IJV                                           Not visualized +----+------------+---------+-----------+----------+--------------+  Summary:  Right: No evidence of deep vein thrombosis in the upper extremity. No evidence of superficial vein thrombosis in the upper extremity.  *See table(s) above for measurements and observations.  Diagnosing physician: Monica Martinez MD Electronically signed by Monica Martinez MD on 05/09/2019 at 5:14:24 PM.    Final     Microbiology: Recent Results (from the past 240 hour(s))  Urine culture     Status: Abnormal   Collection Time: 05/08/19  3:24 PM   Specimen:  Urine, Random  Result Value Ref Range Status   Specimen Description   Final    URINE, RANDOM Performed at [facility], [place] [address], [place], [place] [address]    Special Requests   Final    NONE Performed at [facility], [place] [address], [place], [place] [address]    Culture (A)  Final    <10,000 COLONIES/mL INSIGNIFICANT GROWTH Performed at [place] [address] [place], [place] [address]    Report Status 05/09/2019 FINAL  Final  SARS Coronavirus 2 [facility] order, Performed in [facility] hospital lab) Nasopharyngeal Nasopharyngeal Swab     Status: None   Collection Time: 05/08/19  3:46 PM   Specimen: Nasopharyngeal Swab  Result Value Ref Range Status  SARS Coronavirus 2 NEGATIVE NEGATIVE Final    Comment: (NOTE) If result is NEGATIVE SARS-CoV-2 target nucleic acids are NOT DETECTED. The SARS-CoV-2 RNA is generally detectable in upper and lower  respiratory specimens during the acute phase of infection. The lowest  concentration of SARS-CoV-2 viral copies this assay can detect is 250  copies / mL. A negative result does not preclude SARS-CoV-2 infection  and should not be used as the sole basis for treatment or other  patient management decisions.  A negative result may occur with  improper specimen collection / handling, submission of specimen other  than nasopharyngeal swab, presence of viral mutation(s) within the  areas targeted by this assay, and inadequate number of viral copies  (<250 copies / mL). A negative result must be combined with clinical  observations, patient history, and epidemiological information. If result is POSITIVE SARS-CoV-2 target nucleic acids are DETECTED. The SARS-CoV-2 RNA is generally detectable in upper and lower  respiratory specimens dur ing the acute phase of infection.  Positive  results are indicative of active infection with SARS-CoV-2.  Clinical  correlation with patient history  and other diagnostic information is  necessary to determine patient infection status.  Positive results do  not rule out bacterial infection or co-infection with other viruses. If result is PRESUMPTIVE POSTIVE SARS-CoV-2 nucleic acids MAY BE PRESENT.   A presumptive positive result was obtained on the submitted specimen  and confirmed on repeat testing.  While 2019 novel coronavirus  (SARS-CoV-2) nucleic acids may be present in the submitted sample  additional confirmatory testing may be necessary for epidemiological  and / or clinical management purposes  to differentiate between  SARS-CoV-2 and other Sarbecovirus currently known to infect humans.  If clinically indicated additional testing with an alternate test  methodology [phone]) is advised. The SARS-CoV-2 RNA is generally  detectable in upper and lower respiratory sp ecimens during the acute  phase of infection. The expected result is Negative. Fact Sheet for Patients:  StrictlyIdeas.no Fact Sheet for Healthcare Providers: BankingDealers.co.za This test is not yet approved or cleared by the [place] FDA and has been authorized for detection and/or diagnosis of SARS-CoV-2 by FDA under an Emergency Use Authorization (EUA).  This EUA will remain in effect (meaning this test can be used) for the duration of the COVID-19 declaration under Section 564(b)(1) of the Act, 21 U.S.C. section 360bbb-3(b)(1), unless the authorization is terminated or revoked sooner. Performed at [facility], [place] [address], [place], [place] [address]   Blood culture (routine x 2)     Status: None (Preliminary result)   Collection Time: 05/08/19  3:47 PM   Specimen: BLOOD  Result Value Ref Range Status   Specimen Description   Final    BLOOD RIGHT ANTECUBITAL Performed at [place] [address] [place], [place] [address]    Special Requests   Final     BOTTLES DRAWN AEROBIC AND ANAEROBIC Blood Culture adequate volume Performed at [place] [address], [place], [place] [address]    Culture   Final    NO GROWTH 3 DAYS Performed at [facility], [place] [address] [place], [place] [address]    Report Status PENDING  Incomplete  Blood culture (routine x 2)     Status: None (Preliminary result)   Collection Time: 05/08/19  5:03 PM   Specimen: BLOOD  Result Value Ref Range Status   Specimen Description   Final  BLOOD BLOOD LEFT FOREARM Performed at [facility] [address], [place], [place] [address]    Special Requests   Final    BOTTLES DRAWN AEROBIC AND ANAEROBIC Blood Culture adequate volume Performed at [facility] [address], [place], [place] [address]    Culture   Final    NO GROWTH 3 DAYS Performed at [facility], [place] [address], [place], [place] [address]    Report Status PENDING  Incomplete  MRSA PCR Screening     Status: None   Collection Time: 05/08/19  9:22 PM   Specimen: Nasal Mucosa; Nasopharyngeal  Result Value Ref Range Status   MRSA by PCR NEGATIVE NEGATIVE Final    Comment:        The GeneXpert MRSA Assay (FDA approved for NASAL specimens only), is one component of a comprehensive MRSA colonization surveillance program. It is not intended to diagnose MRSA infection nor to guide or monitor treatment for MRSA infections. Performed at [facility], [place] [address], [place], [place] [address]      Labs: Basic Metabolic Panel: Recent Labs  Lab 05/08/19 1329 05/09/19 0141 05/09/19 0749 05/09/19 1758 05/10/19 0159 05/11/19 0405  NA 134*  --  133* 137 140 138  K 4.1  --  3.9 4.2 3.4* 4.1  CL 104  --  109 111 111 109  CO2 20*  --  16* 17* 18* 22  GLUCOSE 89  --  87 119* 93 77  BUN 15  --  13 15 15 18   CREATININE 0.76  --  0.60 0.73 0.64 0.55  CALCIUM 8.5*  --  7.8* 8.2* 8.1* 8.8*  MG  --  1.7  --    --  1.7 2.0  PHOS  --  4.1  --   --   --   --    Liver Function Tests: Recent Labs  Lab 05/08/19 1329 05/09/19 0749 05/10/19 0159 05/11/19 0405  AST 33 29 27 26   ALT 20 18 15 16   ALKPHOS 106 74 70 91  BILITOT 0.4 0.3 0.1* 0.3  PROT 7.4 6.4* 5.9* 6.7  ALBUMIN 2.6* 2.1* 1.9* 2.2*   No results for input(s): LIPASE, AMYLASE in the last 168 hours. No results for input(s): AMMONIA in the last 168 hours. CBC: Recent Labs  Lab 05/08/19 1329 05/09/19 0141 05/09/19 0748 05/10/19 0159 05/11/19 0405  WBC 5.6 2.2* 2.5* 2.3* 3.2*  NEUTROABS 4.5  --  1.7  --   --   HGB 10.2* 10.1* 9.9* 8.6* 9.0*  HCT 33.0* 32.5* 31.1* 28.0* 28.8*  MCV 92.7 94.5 90.4 94.0 92.6  PLT 190 189 124* 157 212   Cardiac Enzymes: Recent Labs  Lab 05/08/19 1329  CKTOTAL 35*   BNP: BNP (last 3 results) No results for input(s): BNP in the last 8760 hours.  ProBNP (last 3 results) No results for input(s): PROBNP in the last 8760 hours.  CBG: No results for input(s): GLUCAP in the last 168 hours.     Signed:  Fayrene Helper MD.  [facility] 05/11/2019, 7:42 PM

## 2019-05-11 NOTE — Evaluation (Signed)
Physical Therapy Evaluation Patient Details Name: [name] [name] MRN: [id number] DOB: 05-08-1996 Today's Date: 05/11/2019   History of Present Illness  [name] [name] is a 23 y.o. female with medical history significant of lupus nephritis, h/o Raynaud's phenomenon, HTN, gout who presented with RLE swelling and was found to have DVT as well as SIRS with concern for UTI and sepsis  Clinical Impression  Pt tolerated session well had a lot of questions.  Spent a long time with pt trying to learn more about PLOF and helping to answer a lot of questions she had. She states she has not been doing great since she left in mid July, not being able to move a lot and then she understands that that cycle likely caused more pain and these clots. She stays in a 2 level home with her sisters, but will discharge to her grandmother's home which is 1 story and her grandmother and sisters will still assist if needed. She was completely independent at RW level, but currently uses WC if needed and bedside commode.  She is able to return home at this level and would recommend HHPT to work with her to progress her back to her PLOF prior to July.  She had a lot of questions about her clots and was really holding the RLE stiff and flexed with worry " they will dislodge into my lungs or heart if I move" . If the MD could help address this question and concern that would be great.  I encourage slow, easy functional movment at this time. Will continu to follow whil ein acute care to work with strengthening and mobility.   BP still at 130/118 and HR sitting EOB at 124-130 bpm.     Follow Up Recommendations Home health PT    Equipment Recommendations  None recommended by PT    Recommendations for Other Services       Precautions / Restrictions Restrictions Weight Bearing Restrictions: No      Mobility  Bed Mobility Overal bed mobility: Modified Independent                Transfers Overall transfer level:  Needs assistance Equipment used: Rolling walker (2 wheeled);None Transfers: Sit to/from [facility] Sit to Stand: Min assist Stand pivot transfers: Supervision       General transfer comment: pt used to about 180 turn using [employer] on 3n1 to pivot to sit. Also worked on sit to stand at [employer] with [employer] and worked on [employer] using it again now that blood clot swelling has gone down.  Ambulation/Gait Ambulation/Gait assistance: (dind't ambulate at thist time due to R LE, but educated on how to progress at home and use HHPT to continue to progress as well)              Financial trader [place] (Stroke Patients Only)       Balance                                             Pertinent Vitals/Pain Pain Assessment: Faces Faces Pain Scale: Hurts a little bit Pain Location: states her Left shoulder hurts and her R leg hurts especially on the back of it, but feels better. Pain Descriptors / Indicators: Aching Pain Intervention(s): Limited  activity within patient's tolerance;Monitored during session    [place] expects to be discharged to:: Private residence(pt usually at sister's house, but heading to grandmother's house after discharge) Living Arrangements: Other relatives Available Help at Discharge: Family;Available 24 hours/day Type of Home: House Home Access: Level entry       Home Equipment: Walker - 2 wheels;Wheelchair - manual;Bedside commode Additional Comments: plans to stay with her grandmother upon d/c    Prior Function Level of Independence: Independent         Comments: did work at [employer], but hasn't worked since last admission and being not doing as well. Usually independent with all ADLs, and mobility with RW however at this time has been in bed more and gotten weak and painful     Hand Dominance        Extremity/Trunk Assessment        Lower Extremity  Assessment Lower Extremity Assessment: Generalized weakness;RLE deficits/detail RLE Deficits / Details: pt was holding R LE in a flexed position ( about 30-40 degrees flexed" stating she was trying not to use or move it due to the blood clots. We talked about gentle movment is good, and worked on knee extension quads sets to help with extension back to neutral       Communication   Communication: No difficulties  Cognition Arousal/Alertness: Awake/alert Behavior During Therapy: WFL for tasks assessed/performed Overall Cognitive Status: Within Functional Limits for tasks assessed                                        General Comments      Exercises     Assessment/Plan    PT Assessment Patient needs continued PT services  PT Problem List Decreased strength;Decreased range of motion;Decreased activity tolerance;Decreased mobility       PT Treatment Interventions Gait training;Functional mobility training;Therapeutic activities;Therapeutic exercise    PT Goals (Current goals can be found in the Care Plan section)  Acute Rehab PT Goals Patient Stated Goal: I want to go home and continue to get better and move again like I used to PT Goal Formulation: With patient Time For Goal Achievement: 05/24/19 Potential to Achieve Goals: Good    Frequency Min 3X/week   Barriers to discharge        Co-evaluation               AM-PAC PT "6 Clicks" Mobility  Outcome Measure Help needed turning from your back to your side while in a flat bed without using bedrails?: None Help needed moving from lying on your back to sitting on the side of a flat bed without using bedrails?: None Help needed moving to and from a bed to a chair (including a wheelchair)?: A Little Help needed standing up from a chair using your arms (e.g., wheelchair or bedside chair)?: A Little Help needed to walk in hospital room?: A Lot Help needed climbing 3-5 steps with a railing? : A Lot 6  Click Score: 18    End of Session Equipment Utilized During Treatment: Gait belt Activity Tolerance: Patient tolerated treatment well Patient left: in bed Nurse Communication: Mobility status PT Visit Diagnosis: Other abnormalities of gait and mobility (R26.89);Muscle weakness (generalized) (M62.81);Pain Pain - part of body: Shoulder;Leg    Time: [id number] PT Time Calculation (min) (ACUTE ONLY): 41 min   Charges:   PT Evaluation $PT Eval Low  Complexity: 1 Low PT Treatments $Therapeutic Activity: 23-37 mins        Clide Dales, PT Acute Rehabilitation Services Pager: [phone] Office: [phone] 05/11/2019   Clide Dales 05/11/2019, 3:23 PM

## 2019-05-11 NOTE — Plan of Care (Signed)
Discharge instructions reviewed with patient, questions answered, verbalized understanding.  Patient transported via wheelchair to main entrance of hospital to be taken home by family.

## 2019-05-13 LAB — CULTURE, BLOOD (ROUTINE X 2)
Culture: NO GROWTH
Culture: NO GROWTH
Special Requests: ADEQUATE
Special Requests: ADEQUATE

## 2019-05-13 NOTE — Progress Notes (Signed)
Referral given for [facility] services to [facility]. Rep informs agency is unable to provide services at this time.

## 2019-05-14 DIAGNOSIS — M3214 Glomerular disease in systemic lupus erythematosus: Secondary | ICD-10-CM | POA: Insufficient documentation

## 2019-05-17 ENCOUNTER — Other Ambulatory Visit: Payer: Self-pay | Admitting: Family Medicine

## 2019-05-17 DIAGNOSIS — Z7901 Long term (current) use of anticoagulants: Secondary | ICD-10-CM

## 2019-05-17 DIAGNOSIS — Z5181 Encounter for therapeutic drug level monitoring: Secondary | ICD-10-CM

## 2019-05-17 NOTE — Progress Notes (Signed)
Dr. Martin Majestic from [facility] hospital called to request a referral be placed by her PCP to monitor her coumadin. She is currently in the hospital and will be discharged today. Found to have DVT and possible multiple PE's  as reported to this provider by Dr. Hampton Abbot.  Dr. Hampton Abbot can be reached at [phone]

## 2019-05-22 ENCOUNTER — Encounter (HOSPITAL_COMMUNITY): Payer: Self-pay | Admitting: *Deleted

## 2019-05-22 ENCOUNTER — Encounter (HOSPITAL_COMMUNITY): Payer: Self-pay

## 2019-05-26 NOTE — Progress Notes (Deleted)
Cardiology Office Note:    Date:  05/26/2019   ID:  [name] [name], DOB 02-14-1996, MRN [id number]  PCP:  [name], FNP  Cardiologist: Bayard More  Electrophysiologist:  None   Referring MD: [name], FNP   No chief complaint on file. ***  History of Present Illness:    [name] [name] is a 23 y.o. female with a hx of SLE, persistent sinus tachycardia  We were asked by Dr. Florene Glen to see her for further evaluation of her persistent sinus tach and mild pulmonary HTN    Past Medical History:  Diagnosis Date  . Accidental methotrexate overdose 01/08/2018  . Acute lower UTI   . Bronchitis 03/19/2019  . Cardiomegaly 05/08/2019  . Chronic midline low back pain without sciatica 11/09/2016   Facet joint arthropathy  . Dehydration 03/19/2019  . Depo-Provera contraceptive status 08/11/2015  . Depression 02/08/2017  . Dysphagia 06/01/2018  . Dysuria 04/19/2019  . Elevated AST (SGOT) 03/19/2019  . Essential hypertension 03/19/2019  . Facial edema   . Gout   . Hypertension   . IDA (iron deficiency anemia) 08/11/2015  . Immunosuppressed status ([place])   . Insomnia 01/29/2016  . Leukopenia 01/10/2018  . Lobar pneumonia ([place]) 06/07/2018  . Lupus ([place])   . Lupus (systemic lupus erythematosus) ([place]) 03/19/2019  . Metabolic acidosis [id number]  . Methotrexate toxicity   . Mouth ulcers   . Mucositis oral 01/12/2018  . Odynophagia 06/01/2018  . Oral thrush 03/19/2019  . Pleuritic chest pain 06/05/2018  . Primary osteoarthritis of both knees 11/09/2016  . Rash due to systemic lupus erythematosus (SLE) ([place])   . Raynaud disease   . Raynaud's disease 08/11/2015  . Relative acute adrenal insufficiency ([place]) 06/11/2018  . Right leg DVT ([place]) 05/08/2019  . Seasonal allergies 08/11/2015  . Sepsis ([place]) 05/08/2019  . Sinus tachycardia 03/19/2019  . SLE (systemic lupus erythematosus) ([place]) 09/19/2016     Positive ANA, double-stranded DNA, Ro, RNP, RF, history of nasal ulcers, fatigue, anemia, arthritis, Raynauds  .  Thrush 06/01/2018  . Vaginal candidiasis 01/12/2018  . Vitamin D deficiency 11/09/2016    Past Surgical History:  Procedure Laterality Date  . ORIF SHOULDER FRACTURE Left   . TONSILLECTOMY      Current Medications: No outpatient medications have been marked as taking for the 05/27/19 encounter (Appointment) with [name] [name], Wonda Cheng, MD.     Allergies:   Hydroxychloroquine and Latex   Social History   Socioeconomic History  . Marital status: Single    Spouse name: Not on file  . Number of children: Not on file  . Years of education: Not on file  . Highest education level: Not on file  Occupational History  . Not on file  Social Needs  . Financial resource strain: Not on file  . Food insecurity    Worry: Not on file    Inability: Not on file  . Transportation needs    Medical: Not on file    Non-medical: Not on file  Tobacco Use  . Smoking status: Never Smoker  . Smokeless tobacco: Never Used  Substance and Sexual Activity  . Alcohol use: No  . Drug use: No  . Sexual activity: Not Currently  Lifestyle  . Physical activity    Days per week: Not on file    Minutes per session: Not on file  . Stress: Not on file  Relationships  . Social connections    Talks on phone: Not on file  Gets together: Not on file    Attends religious service: Not on file    Active member of club or organization: Not on file    Attends meetings of clubs or organizations: Not on file    Relationship status: Not on file  Other Topics Concern  . Not on file  Social History Narrative  . Not on file     Family History: The patient's ***family history includes Aneurysm in her mother; Heart attack in her paternal uncle; Lung cancer in her maternal aunt; Seizures in her sister; Stroke in her sister. There is no history of Colon cancer, Esophageal cancer, Pancreatic cancer, or Stomach cancer.  ROS:   Please see the history of present illness.    *** All other systems reviewed and are negative.   EKGs/Labs/Other Studies Reviewed:    The following studies were reviewed today: ***  EKG:  EKG is *** ordered today.  The ekg ordered today demonstrates ***  Recent Labs: 05/09/2019: TSH 3.309 05/11/2019: ALT 16; BUN 18; Creatinine, Ser 0.55; Hemoglobin 9.0; Magnesium 2.0; Platelets 212; Potassium 4.1; Sodium 138  Recent Lipid Panel No results found for: CHOL, TRIG, HDL, CHOLHDL, VLDL, LDLCALC, LDLDIRECT  Physical Exam:    VS:  There were no vitals taken for this visit.    Wt Readings from Last 3 Encounters:  05/10/19 131 lb 2.8 oz (59.5 kg)  04/19/19 116 lb (52.6 kg)  04/10/19 116 lb (52.6 kg)     GEN: *** Well nourished, well developed in no acute distress HEENT: Normal NECK: No JVD; No carotid bruits LYMPHATICS: No lymphadenopathy CARDIAC: ***RRR, no murmurs, rubs, gallops RESPIRATORY:  Clear to auscultation without rales, wheezing or rhonchi  ABDOMEN: Soft, non-tender, non-distended MUSCULOSKELETAL:  No edema; No deformity  SKIN: Warm and dry NEUROLOGIC:  Alert and oriented x 3 PSYCHIATRIC:  Normal affect   ASSESSMENT:    No diagnosis found. PLAN:    In order of problems listed above:  1. ***   Medication Adjustments/Labs and Tests Ordered: Current medicines are reviewed at length with the patient today.  Concerns regarding medicines are outlined above.  No orders of the defined types were placed in this encounter.  No orders of the defined types were placed in this encounter.   There are no Patient Instructions on file for this visit.   Signed, Mertie Moores, MD  05/26/2019 7:50 PM    [place]

## 2019-05-27 ENCOUNTER — Telehealth: Payer: Self-pay

## 2019-05-27 ENCOUNTER — Ambulatory Visit: Payer: Self-pay | Admitting: Cardiovascular Disease

## 2019-05-27 ENCOUNTER — Other Ambulatory Visit: Payer: Self-pay

## 2019-05-27 DIAGNOSIS — K1230 Oral mucositis (ulcerative), unspecified: Secondary | ICD-10-CM

## 2019-05-27 MED ORDER — MUPIROCIN 2 % EX OINT: 1.0000 "application " | TOPICAL_OINTMENT | Freq: Two times a day (BID) | CUTANEOUS | 0 refills | Status: DC

## 2019-05-27 NOTE — Telephone Encounter (Signed)
I called and spoke with patient, advised that we did not prescribe any injections for her and would not be able to refill this for her. Asked that she call the doctor how prescribed the injections.

## 2019-05-27 NOTE — Progress Notes (Signed)
Cardiology Office Note:    Date:  05/28/2019   ID:  [name], DOB April 16, 1996, MRN [id number]  PCP:  [name], FNP  Cardiologist:  Ashli Selders  Electrophysiologist:  None   Referring MD: [name], FNP   Chief Complaint  Patient presents with  . Shortness of Breath     History of Present Illness:    [name] [name] is a 23 y.o. female with a hx of SIRS, UTI, DVT and sinus tachycardia.     Pt has has a hx of Lupus.   Has been on steroids and hydroxychloroquine for about 6 years.  She has been recently found to have some renal abnormalities and sees a nephrologist at [facility].  Was seen by Dr. Posey Pronto at [facility].  . She was seen at [facility] for routine checkup but they noticed that she had a DVT which had been going on to the, pulmonary embolus.  She was started on Lovenox as well as warfarin.  She is here today to establish care to be monitored in the Coumadin clinic.  She  Was  on Xarelto for her DVT but is going to be cost prohibitive.  It was going to cost her 700 hours a month.  She transitioned to warfarin.  While she was in the hospital at [facility], they started her on warfarin and gave her Lovenox injections for 7 days.   She has taken the warfarin for the past 4 days.   Continues to have pleuretic CP   Hx of SLE.  Raynauds phenomenon, HTN, gout.  Was on birth controls.    Breathing is ok Has DOE  Is not very active.  Advised her to exercise    Past Medical History:  Diagnosis Date  . Accidental methotrexate overdose 01/08/2018  . Acute lower UTI   . Bronchitis 03/19/2019  . Cardiomegaly 05/08/2019  . Chronic midline low back pain without sciatica 11/09/2016   Facet joint arthropathy  . Dehydration 03/19/2019  . Depo-Provera contraceptive status 08/11/2015  . Depression 02/08/2017  . Dysphagia 06/01/2018  . Dysuria 04/19/2019  . Elevated AST (SGOT) 03/19/2019  . Essential hypertension 03/19/2019  . Facial edema   . Gout   . Hypertension   . IDA (iron deficiency  anemia) 08/11/2015  . Immunosuppressed status ([place])   . Insomnia 01/29/2016  . Leukopenia 01/10/2018  . Lobar pneumonia ([place]) 06/07/2018  . Lupus ([place])   . Lupus (systemic lupus erythematosus) ([place]) 03/19/2019  . Metabolic acidosis [id number]  . Methotrexate toxicity   . Mouth ulcers   . Mucositis oral 01/12/2018  . Odynophagia 06/01/2018  . Oral thrush 03/19/2019  . Pleuritic chest pain 06/05/2018  . Primary osteoarthritis of both knees 11/09/2016  . Rash due to systemic lupus erythematosus (SLE) ([place])   . Raynaud disease   . Raynaud's disease 08/11/2015  . Relative acute adrenal insufficiency ([place]) 06/11/2018  . Right leg DVT ([place]) 05/08/2019  . Seasonal allergies 08/11/2015  . Sepsis ([place]) 05/08/2019  . Sinus tachycardia 03/19/2019  . SLE (systemic lupus erythematosus) ([place]) 09/19/2016     Positive ANA, double-stranded DNA, Ro, RNP, RF, history of nasal ulcers, fatigue, anemia, arthritis, Raynauds  . Thrush 06/01/2018  . Vaginal candidiasis 01/12/2018  . Vitamin D deficiency 11/09/2016    Past Surgical History:  Procedure Laterality Date  . ORIF SHOULDER FRACTURE Left   . TONSILLECTOMY      Current Medications: Current Meds  Medication Sig  . amLODipine (NORVASC) 10 MG tablet Take 1 tablet (10  mg total) by mouth daily.  Marland Kitchen buPROPion (WELLBUTRIN XL) 150 MG 24 hr tablet Take 1 tablet (150 mg total) by mouth daily.  . cyclobenzaprine (FLEXERIL) 10 MG tablet Take 1 tablet (10 mg total) by mouth 2 (two) times daily as needed for muscle spasms.  . diphenhydrAMINE (BENADRYL) 25 MG tablet Take 25 mg by mouth every 6 (six) hours as needed for allergies.  . ferrous sulfate 325 (65 FE) MG tablet Take 1 tablet (325 mg total) by mouth 2 (two) times daily. With 1/2 glass of orange juice  . folic acid (FOLVITE) 1 MG tablet Take 1 tablet (1 mg total) by mouth at bedtime.  . gabapentin (NEURONTIN) 300 MG capsule Take 1 capsule (300 mg total) by mouth 3 (three) times daily.  Marland Kitchen loperamide (IMODIUM) 2 MG capsule  Take 1 capsule (2 mg total) by mouth as needed for diarrhea or loose stools.  . metoprolol tartrate (LOPRESSOR) 25 MG tablet Take 1 tablet (25 mg total) by mouth 2 (two) times daily.  . Multiple Vitamin (MULTIVITAMIN WITH MINERALS) TABS tablet Take 1 tablet by mouth daily.  . mupirocin ointment (BACTROBAN) 2 % Place 1 application into the nose 2 (two) times daily.  Marland Kitchen nystatin (MYCOSTATIN) 100000 UNIT/ML suspension Take 5 mLs (500,000 Units total) by mouth 2 (two) times daily as needed.  . pantoprazole (PROTONIX) 20 MG tablet Take 1 tablet (20 mg total) by mouth at bedtime.  . predniSONE (DELTASONE) 10 MG tablet Take 4 tablets (40 mg total) by mouth daily with breakfast for 7 days, THEN 3 tablets (30 mg total) daily with breakfast for 7 days, THEN 2 tablets (20 mg total) daily with breakfast for 14 days. (discuss continued dosing with your rheumatologist in follow up).  . traZODone (DESYREL) 100 MG tablet Take 1 tablet (100 mg total) by mouth at bedtime.  . Vitamin D, Ergocalciferol, (DRISDOL) 50000 units CAPS capsule Take 1 capsule (50,000 Units total) by mouth 2 (two) times a week.  . [DISCONTINUED] Rivaroxaban 15 & 20 MG TBPK Take as directed on package: Start with one 15mg  tablet by mouth twice a day with food. On Day 22, switch to one 20mg  tablet once a day with food.     Allergies:   Hydroxychloroquine and Latex   Social History   Socioeconomic History  . Marital status: Single    Spouse name: Not on file  . Number of children: Not on file  . Years of education: Not on file  . Highest education level: Not on file  Occupational History  . Not on file  Social Needs  . Financial resource strain: Not on file  . Food insecurity    Worry: Not on file    Inability: Not on file  . Transportation needs    Medical: Not on file    Non-medical: Not on file  Tobacco Use  . Smoking status: Never Smoker  . Smokeless tobacco: Never Used  Substance and Sexual Activity  . Alcohol use: No  .  Drug use: No  . Sexual activity: Not Currently  Lifestyle  . Physical activity    Days per week: Not on file    Minutes per session: Not on file  . Stress: Not on file  Relationships  . Social Herbalist on phone: Not on file    Gets together: Not on file    Attends religious service: Not on file    Active member of club or organization: Not on file  Attends meetings of clubs or organizations: Not on file    Relationship status: Not on file  Other Topics Concern  . Not on file  Social History Narrative  . Not on file     Family History: The patient's family history includes Aneurysm in her mother; Heart attack in her paternal uncle; Lung cancer in her maternal aunt; Seizures in her sister; Stroke in her sister. There is no history of Colon cancer, Esophageal cancer, Pancreatic cancer, or Stomach cancer.  ROS:   Please see the history of present illness.     All other systems reviewed and are negative.  EKGs/Labs/Other Studies Reviewed:    The following studies were reviewed today:   EKG:    Recent Labs: 05/09/2019: TSH 3.309 05/11/2019: ALT 16; BUN 18; Creatinine, Ser 0.55; Hemoglobin 9.0; Magnesium 2.0; Platelets 212; Potassium 4.1; Sodium 138  Recent Lipid Panel No results found for: CHOL, TRIG, HDL, CHOLHDL, VLDL, LDLCALC, LDLDIRECT  Physical Exam:    VS:  BP 120/86   Pulse 80   Ht 5\' 3"  (1.6 m)   Wt 124 lb 12.8 oz (56.6 kg)   BMI 22.11 kg/m     Wt Readings from Last 3 Encounters:  05/28/19 124 lb 12.8 oz (56.6 kg)  05/10/19 131 lb 2.8 oz (59.5 kg)  04/19/19 116 lb (52.6 kg)     GEN: chronically ill appearing female,   NAD   HEENT: Normal NECK: No JVD; No carotid bruits LYMPHATICS: No lymphadenopathy CARDIAC: RR, soft systolic murnur  RESPIRATORY:  Clear to auscultation without rales, wheezing or rhonchi  ABDOMEN: Soft, non-tender, non-distended MUSCULOSKELETAL:  No edema; No deformity  SKIN: multiple skin scars  NEUROLOGIC:  Alert and  oriented x 3 PSYCHIATRIC:  Normal affect   ASSESSMENT:    No diagnosis found. PLAN:    In order of problems listed above:  1. DVT/pulmonary embolus: The patient presents with a history of lupus.  She was recently found to have a DVT and pulmonary emboli.  She was discharged from the hospital on Xarelto but in fact never filled that prescription because it was too expensive.  She later went to an appointment to her rheumatologist who placed her back in the hospital at [facility] and started her on Lovenox/warfarin.  She is here to establish care for anticoagulation monitoring.  She overall is feeling a little bit better.  She still gets short of breath with ambulation.  She still has pleuritic chest pain.  There apparently was some confusion in the dosing.  She has been taking the Lovenox for 7 days but did not stop the Coumadin until 4 days ago.  We will send her to the Coumadin clinic for an INR today.  I will have our pharmacist work through a proper dosing regimen for both her Coumadin and her Lovenox.  I will plan on seeing her again in 3 months for follow-up.  My guess is that she will need lifelong Coumadin but I would defer to hematology as to the duration of her anticoagulation.  She was on estrogen injections for birth control but she has stopped that.   I will see her again in 3 months for a follow-up office visit.   Medication Adjustments/Labs and Tests Ordered: Current medicines are reviewed at length with the patient today.  Concerns regarding medicines are outlined above.  No orders of the defined types were placed in this encounter.  No orders of the defined types were placed in this encounter.  Patient Instructions  Medication Instructions:  1)Coumadin Clinic will set you up with coumadin now.   If you need a refill on your cardiac medications before your next appointment, please call your pharmacy.   Lab work: None If you have labs (blood work) drawn today  and your tests are completely normal, you will receive your results only by: Marland Kitchen MyChart Message (if you have MyChart) OR . A paper copy in the mail If you have any lab test that is abnormal or we need to change your treatment, we will call you to review the results.  Testing/Procedures: None  Follow-Up: At [facility], you and your health needs are our priority.  As part of our continuing mission to provide you with exceptional heart care, we have created designated Provider Care Teams.  These Care Teams include your primary Cardiologist (physician) and Advanced Practice Providers (APPs -  Physician Assistants and Nurse Practitioners) who all work together to provide you with the care you need, when you need it. You will need a follow up appointment in:  3 months.  Please call our office 2 months in advance to schedule this appointment.  You may see Dr. Acie Fredrickson or one of the following Advanced Practice Providers on your designated Care Team: Richardson Dopp, PA-C [place], [place] . Daune Perch, NP  Any Other Special Instructions Will Be Listed Below (If Applicable).       Signed, Mertie Moores, MD  05/28/2019 5:08 PM    [place]

## 2019-05-28 ENCOUNTER — Other Ambulatory Visit: Payer: Self-pay

## 2019-05-28 ENCOUNTER — Ambulatory Visit (INDEPENDENT_AMBULATORY_CARE_PROVIDER_SITE_OTHER): Payer: Self-pay | Admitting: Pharmacist

## 2019-05-28 ENCOUNTER — Ambulatory Visit (INDEPENDENT_AMBULATORY_CARE_PROVIDER_SITE_OTHER): Payer: Self-pay | Admitting: Cardiovascular Disease

## 2019-05-28 ENCOUNTER — Encounter: Payer: Self-pay | Admitting: Cardiovascular Disease

## 2019-05-28 VITALS — BP 120/86 | HR 80 | Ht 63.0 in | Wt 124.8 lb

## 2019-05-28 DIAGNOSIS — I824Y1 Acute embolism and thrombosis of unspecified deep veins of right proximal lower extremity: Secondary | ICD-10-CM

## 2019-05-28 DIAGNOSIS — M329 Systemic lupus erythematosus, unspecified: Secondary | ICD-10-CM

## 2019-05-28 DIAGNOSIS — I272 Pulmonary hypertension, unspecified: Secondary | ICD-10-CM | POA: Insufficient documentation

## 2019-05-28 DIAGNOSIS — I2699 Other pulmonary embolism without acute cor pulmonale: Secondary | ICD-10-CM

## 2019-05-28 DIAGNOSIS — I10 Essential (primary) hypertension: Secondary | ICD-10-CM

## 2019-05-28 LAB — POCT INR: INR: 2.5 (ref 2.0–3.0)

## 2019-05-28 NOTE — Patient Instructions (Signed)
Medication Instructions:  1)Coumadin Clinic will set you up with coumadin now.   If you need a refill on your cardiac medications before your next appointment, please call your pharmacy.   Lab work: None If you have labs (blood work) drawn today and your tests are completely normal, you will receive your results only by: Marland Kitchen MyChart Message (if you have MyChart) OR . A paper copy in the mail If you have any lab test that is abnormal or we need to change your treatment, we will call you to review the results.  Testing/Procedures: None  Follow-Up: At [facility], you and your health needs are our priority.  As part of our continuing mission to provide you with exceptional heart care, we have created designated Provider Care Teams.  These Care Teams include your primary Cardiologist (physician) and Advanced Practice Providers (APPs -  Physician Assistants and Nurse Practitioners) who all work together to provide you with the care you need, when you need it. You will need a follow up appointment in:  3 months.  Please call our office 2 months in advance to schedule this appointment.  You may see Dr. Acie Fredrickson or one of the following Advanced Practice Providers on your designated Care Team: Richardson Dopp, PA-C [place], [place] . Daune Perch, NP  Any Other Special Instructions Will Be Listed Below (If Applicable).

## 2019-05-28 NOTE — Patient Instructions (Signed)
Description   Stop your Lovenox shots. Start taking warfarin 1 tablet daily except 1/2 tablet on Mondays, Wednesdays, and Fridays. Recheck INR in 1 week. Look for [place] tax return so that we can fill out patient assistance paperwork for Xarelto - you can drop this off to the pharmacists at [facility]. Medicaid covers this medication well too.

## 2019-05-29 DIAGNOSIS — M329 Systemic lupus erythematosus, unspecified: Secondary | ICD-10-CM | POA: Insufficient documentation

## 2019-06-04 ENCOUNTER — Other Ambulatory Visit: Payer: Self-pay

## 2019-06-04 ENCOUNTER — Ambulatory Visit (INDEPENDENT_AMBULATORY_CARE_PROVIDER_SITE_OTHER): Payer: Self-pay | Admitting: *Deleted

## 2019-06-04 DIAGNOSIS — I824Y1 Acute embolism and thrombosis of unspecified deep veins of right proximal lower extremity: Secondary | ICD-10-CM

## 2019-06-04 DIAGNOSIS — M329 Systemic lupus erythematosus, unspecified: Secondary | ICD-10-CM

## 2019-06-04 LAB — POCT INR: INR: 4 — AB (ref 2.0–3.0)

## 2019-06-04 NOTE — Progress Notes (Signed)
Pt brought Xarelto paperwork. Gave paper work to [employer].

## 2019-06-04 NOTE — Patient Instructions (Signed)
Description   Hold Coumadin today, then continue taking warfarin 1 tablet daily except 1/2 tablet on Mondays, Wednesdays, and Fridays. Recheck INR in 1 week. Look for [place] tax return so that we can fill out patient assistance paperwork for Xarelto - you can drop this off to the pharmacists at [facility]. Medicaid covers this medication well too.

## 2019-06-11 ENCOUNTER — Other Ambulatory Visit: Payer: Self-pay

## 2019-06-11 ENCOUNTER — Ambulatory Visit (INDEPENDENT_AMBULATORY_CARE_PROVIDER_SITE_OTHER): Payer: Self-pay | Admitting: *Deleted

## 2019-06-11 DIAGNOSIS — M329 Systemic lupus erythematosus, unspecified: Secondary | ICD-10-CM

## 2019-06-11 DIAGNOSIS — I824Y1 Acute embolism and thrombosis of unspecified deep veins of right proximal lower extremity: Secondary | ICD-10-CM

## 2019-06-11 LAB — POCT INR: INR: 2 (ref 2.0–3.0)

## 2019-06-11 NOTE — Patient Instructions (Addendum)
Description   Continue taking warfarin 1 tablet daily except 1/2 tablet on Mondays, Wednesdays, and Fridays. Recheck INR in 2 weeks. Look for [place] tax return so that we can fill out patient assistance paperwork for Xarelto - you can drop this off to the pharmacists at [facility]. Medicaid covers this medication well too.

## 2019-06-17 ENCOUNTER — Other Ambulatory Visit: Payer: Self-pay | Admitting: Family Medicine

## 2019-06-17 DIAGNOSIS — K1230 Oral mucositis (ulcerative), unspecified: Secondary | ICD-10-CM

## 2019-06-25 ENCOUNTER — Ambulatory Visit (INDEPENDENT_AMBULATORY_CARE_PROVIDER_SITE_OTHER): Payer: Self-pay | Admitting: *Deleted

## 2019-06-25 ENCOUNTER — Other Ambulatory Visit: Payer: Self-pay

## 2019-06-25 DIAGNOSIS — I824Y1 Acute embolism and thrombosis of unspecified deep veins of right proximal lower extremity: Secondary | ICD-10-CM

## 2019-06-25 DIAGNOSIS — M329 Systemic lupus erythematosus, unspecified: Secondary | ICD-10-CM

## 2019-06-25 DIAGNOSIS — Z5181 Encounter for therapeutic drug level monitoring: Secondary | ICD-10-CM

## 2019-06-25 LAB — POCT INR: INR: 1.2 — AB (ref 2.0–3.0)

## 2019-06-25 MED ORDER — WARFARIN SODIUM 5 MG PO TABS: ORAL_TABLET | ORAL | 1 refills | Status: DC

## 2019-06-25 NOTE — Progress Notes (Signed)
Spoke with pharm D and pt. Pt stated she is still waiting on her Xarelto paperwork. Instructed pt to call Coumadin Clinic when paperwork comes in.

## 2019-06-25 NOTE — Patient Instructions (Signed)
Description    Take 1.5 tablets today and 1 tablet tomorrow, then start taking 1 tablet daily except for 1/2 tablet on Monday and Fridays. Call coumadin clinic for any changes in medications, questions or upcoming procedures. [phone]. Recheck INR in 1 week. Look for [place] tax return so that we can fill out patient assistance paperwork for Xarelto - you can drop this off to the pharmacists at [facility]. Medicaid covers this medication well too.

## 2019-07-02 ENCOUNTER — Ambulatory Visit (INDEPENDENT_AMBULATORY_CARE_PROVIDER_SITE_OTHER): Payer: Self-pay | Admitting: *Deleted

## 2019-07-02 ENCOUNTER — Other Ambulatory Visit: Payer: Self-pay

## 2019-07-02 DIAGNOSIS — Z5181 Encounter for therapeutic drug level monitoring: Secondary | ICD-10-CM

## 2019-07-02 DIAGNOSIS — M329 Systemic lupus erythematosus, unspecified: Secondary | ICD-10-CM

## 2019-07-02 DIAGNOSIS — I824Y1 Acute embolism and thrombosis of unspecified deep veins of right proximal lower extremity: Secondary | ICD-10-CM

## 2019-07-02 LAB — POCT INR: INR: 2.6 (ref 2.0–3.0)

## 2019-07-02 NOTE — Patient Instructions (Signed)
Description   Continue taking 1 tablet daily except for 1/2 tablet on Monday and Fridays. Call coumadin clinic for any changes in medications, questions or upcoming procedures. [phone]. Recheck INR in 1 week. Look for [place] tax return so that we can fill out patient assistance paperwork for Xarelto - you can drop this off to the pharmacists at [facility]. Medicaid covers this medication well too.

## 2019-07-10 ENCOUNTER — Other Ambulatory Visit: Payer: Self-pay

## 2019-07-10 ENCOUNTER — Ambulatory Visit (INDEPENDENT_AMBULATORY_CARE_PROVIDER_SITE_OTHER): Payer: Self-pay | Admitting: Pharmacist

## 2019-07-10 DIAGNOSIS — I824Y1 Acute embolism and thrombosis of unspecified deep veins of right proximal lower extremity: Secondary | ICD-10-CM

## 2019-07-10 DIAGNOSIS — M329 Systemic lupus erythematosus, unspecified: Secondary | ICD-10-CM

## 2019-07-10 LAB — POCT INR: INR: 1 — AB (ref 2.0–3.0)

## 2019-07-10 MED ORDER — RIVAROXABAN 20 MG PO TABS: 20.0000 mg | ORAL_TABLET | Freq: Every day | ORAL | 1 refills | Status: DC

## 2019-07-10 NOTE — Patient Instructions (Addendum)
Description   Start taking Xarelto 20 mg daily with supper and stop taking warfarin.

## 2019-07-14 IMAGING — CT CT ANGIO CHEST
2 of 6 series · 19 of 36 positions shown · IV contrast (iopamidol)
Comparison: None.

CLINICAL DATA: Chest pain, shortness of breath

EXAM:
CT ANGIOGRAPHY CHEST WITH CONTRAST
TECHNIQUE: Multidetector CT imaging of the chest was performed using the
standard protocol during bolus administration of intravenous
contrast. Multiplanar CT image reconstructions and MIPs were
obtained to evaluate the vascular anatomy.
CONTRAST:  100mL YB51GX-BZ1 IOPAMIDOL (YB51GX-BZ1) INJECTION 76%

[Series 7: pe thins · axial · 0.78mm/px · z∈[+1161,+1388]mm · 18 of 363 slices shown]
[im 19/363  lung]
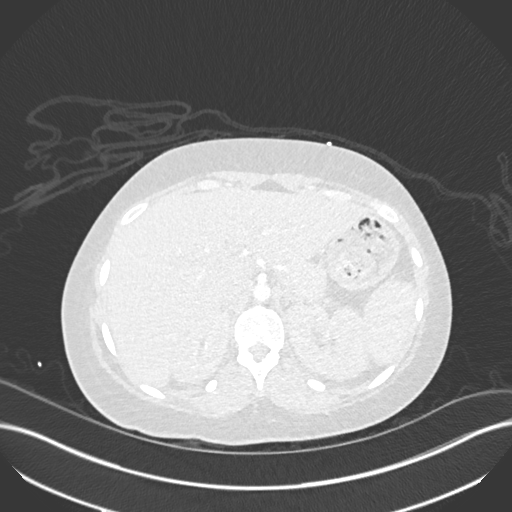
[im 37/363  mediastinal]
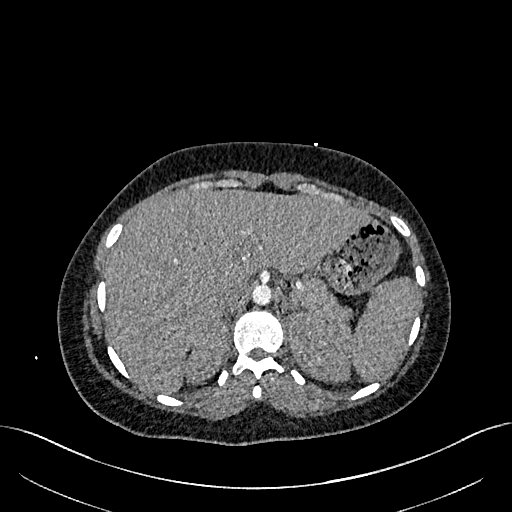
[im 55/363  lung]
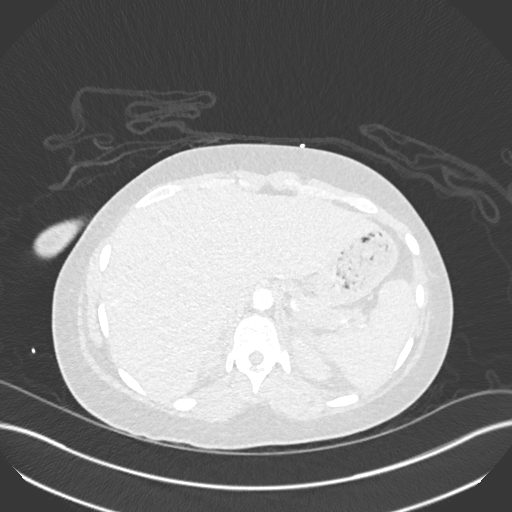
[im 73/363  mediastinal]
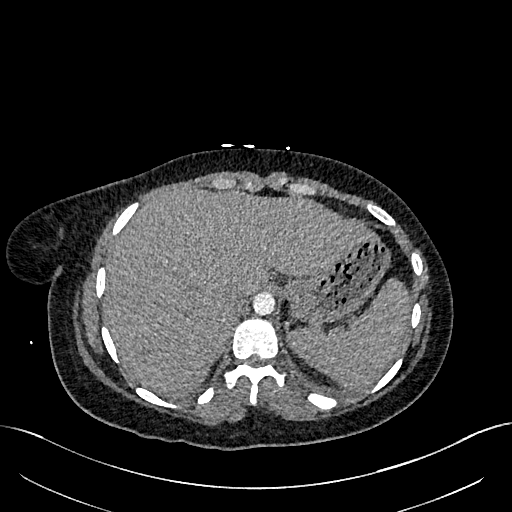
[im 91/363  lung]
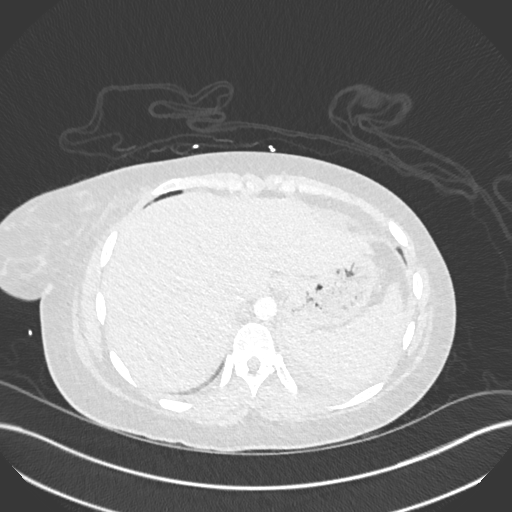
[im 109/363  mediastinal]
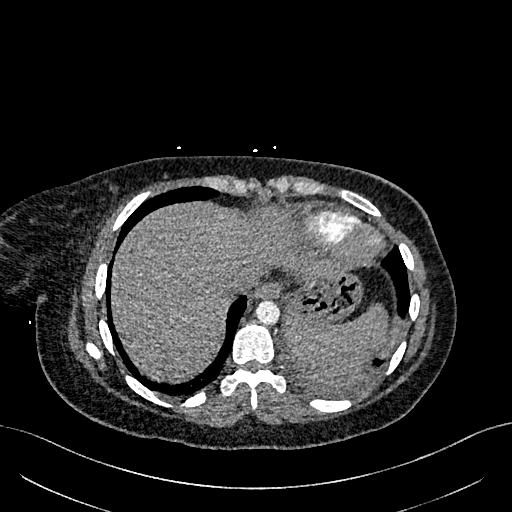
[im 127/363  lung]
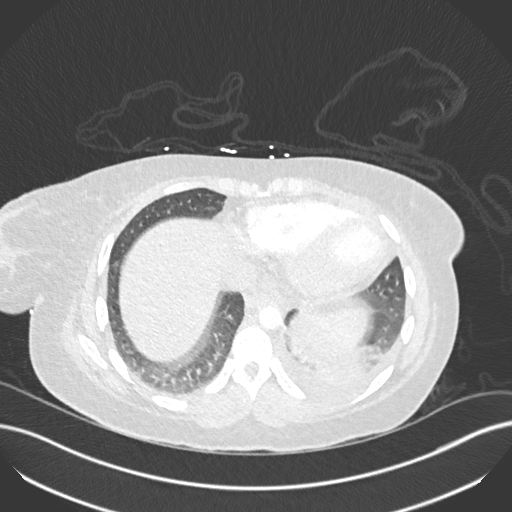
[im 145/363  mediastinal]
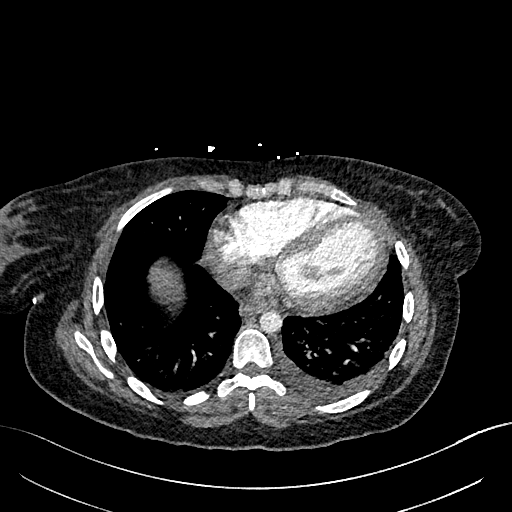
[im 163/363  lung]
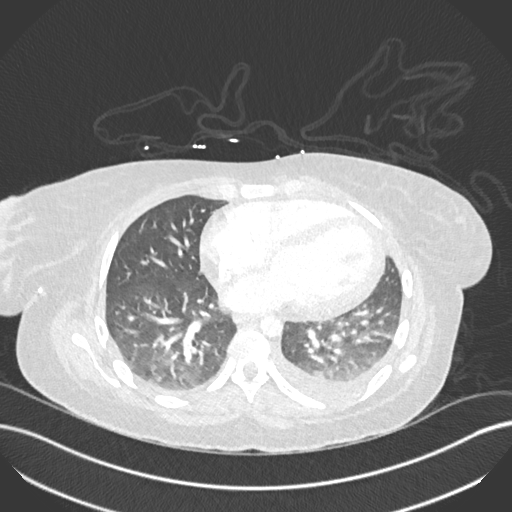
[im 200/363  mediastinal]
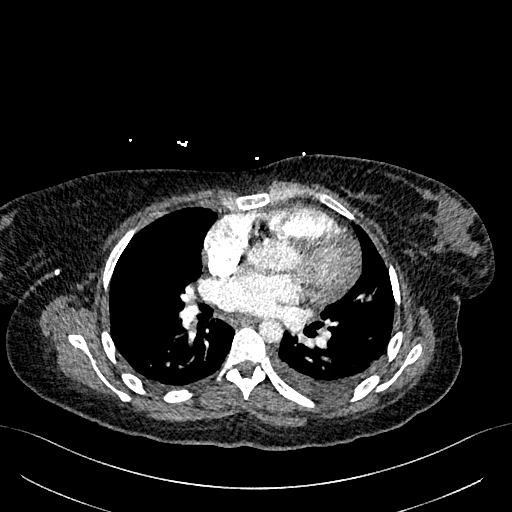
[im 218/363  lung]
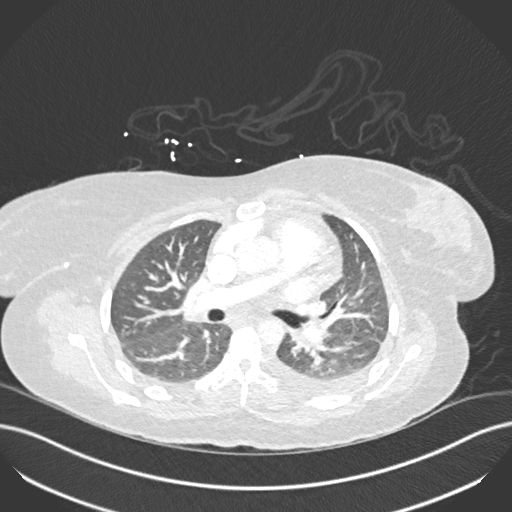
[im 236/363  mediastinal]
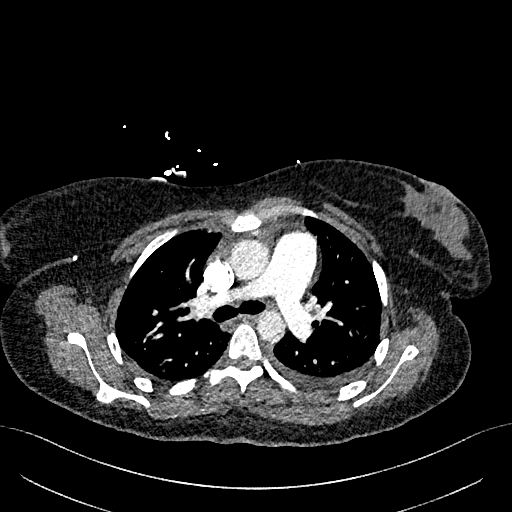
[im 254/363  lung]
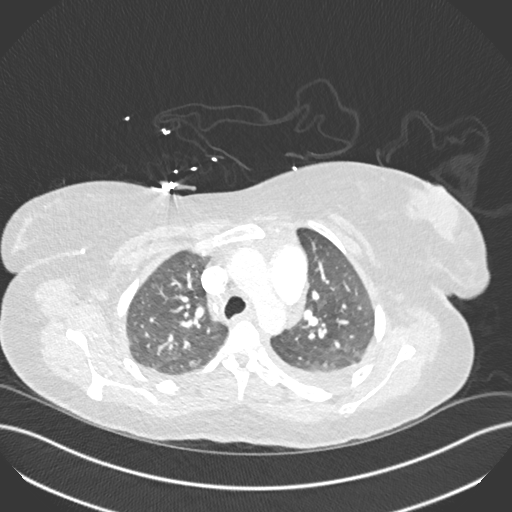
[im 272/363  mediastinal]
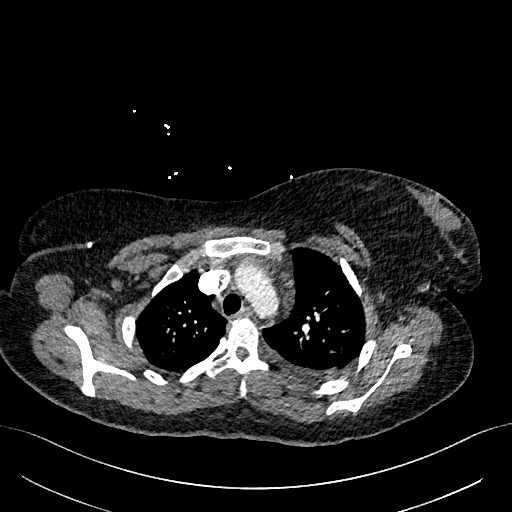
[im 290/363  lung]
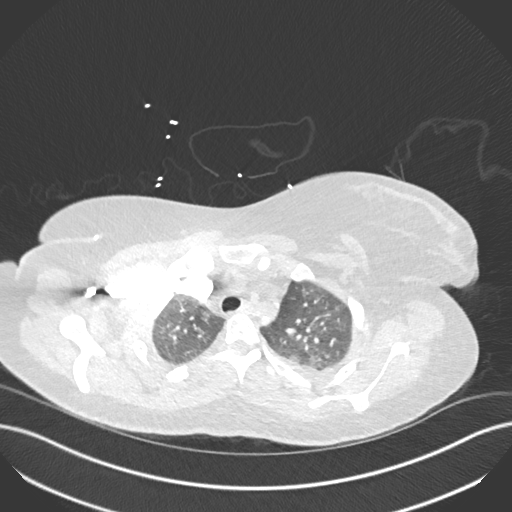
[im 308/363  mediastinal]
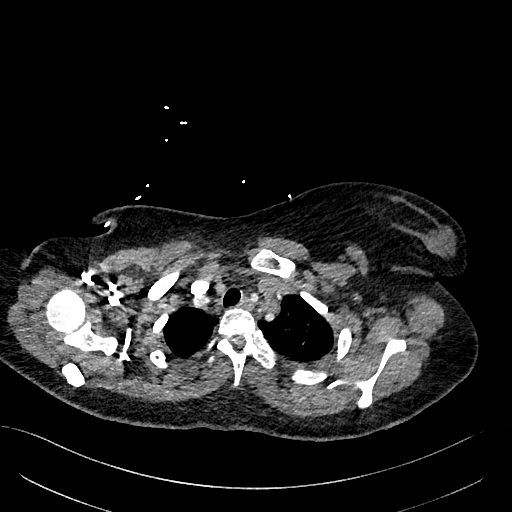
[im 326/363  lung]
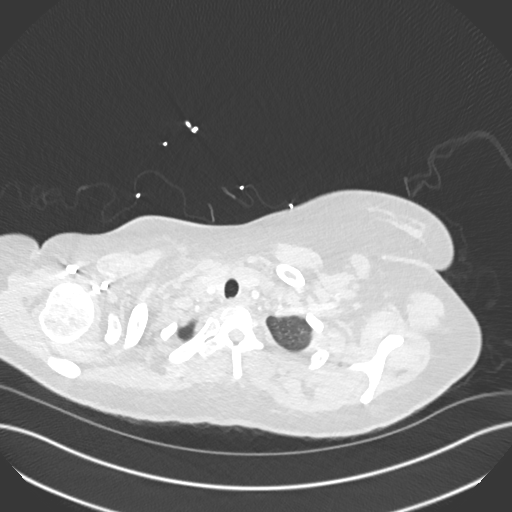
[im 344/363  mediastinal]
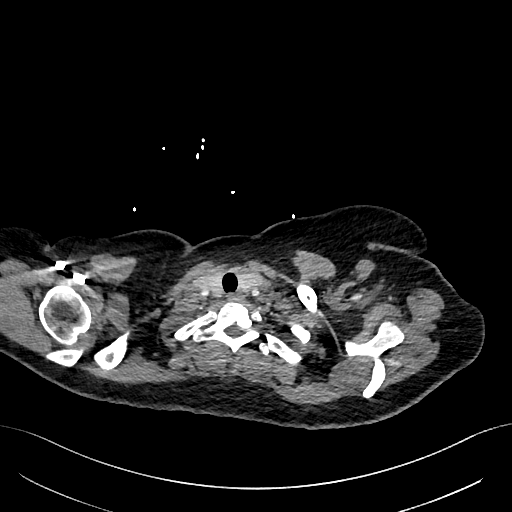

[Series 8: pe 2mm cor · coronal · 0.54mm/px · 1 of 148 slices shown]
[im 74/148  mediastinal]
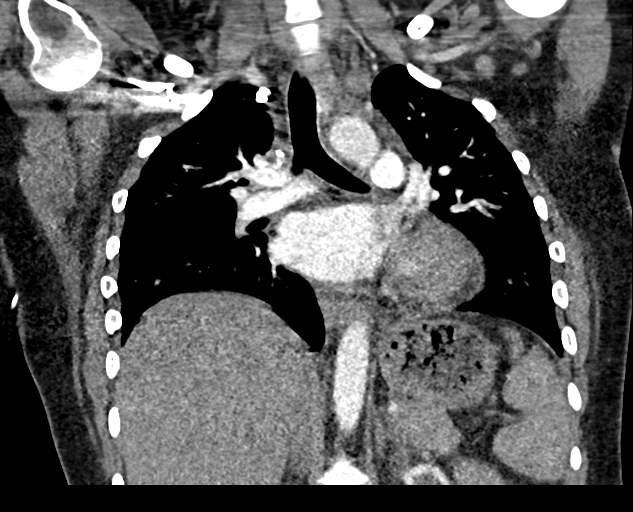

[19 of 36 positions shown; findings below may reference images not displayed]

FINDINGS: Cardiovascular: Satisfactory opacification of the pulmonary arteries
to the segmental level. No evidence of pulmonary embolism. Normal
heart size. Mild cardiomegaly. Thoracic aorta is normal in caliber.
No thoracic aortic aneurysm or dissection.

Mediastinum/Nodes: No enlarged mediastinal, hilar, or axillary lymph
nodes. Thyroid gland, trachea, and esophagus demonstrate no
significant findings.

Lungs/Pleura: Small bilateral pleural effusions, left greater than
right. Mild bilateral interstitial prominence. No focal
consolidation or pneumothorax.

Upper Abdomen: No acute abnormality.

Musculoskeletal: No acute osseous abnormality. No aggressive osseous
lesion.

Review of the MIP images confirms the above findings.
IMPRESSION: 1. No evidence pulmonary embolus.
2. Mild cardiomegaly, small bilateral pleural effusions and
bilateral mild interstitial thickening. Overall findings are
concerning for pulmonary edema.

## 2019-07-18 ENCOUNTER — Other Ambulatory Visit: Payer: Self-pay | Admitting: Cardiovascular Disease

## 2019-07-18 NOTE — Telephone Encounter (Signed)
Refill Request.  

## 2019-07-22 ENCOUNTER — Other Ambulatory Visit: Payer: Self-pay

## 2019-07-22 MED ORDER — PANTOPRAZOLE SODIUM 20 MG PO TBEC: 20.0000 mg | DELAYED_RELEASE_TABLET | Freq: Every day | ORAL | 3 refills | Status: DC

## 2019-07-22 MED ORDER — TRAZODONE HCL 100 MG PO TABS: 100.0000 mg | ORAL_TABLET | Freq: Every day | ORAL | 3 refills | Status: DC

## 2019-07-22 MED ORDER — NYSTATIN 100000 UNIT/ML MT SUSP: 5.0000 mL | Freq: Two times a day (BID) | OROMUCOSAL | 3 refills | Status: DC | PRN

## 2019-08-13 ENCOUNTER — Other Ambulatory Visit: Payer: Self-pay | Admitting: Internal Medicine

## 2019-08-21 ENCOUNTER — Telehealth: Payer: Self-pay | Admitting: Pharmacist

## 2019-08-21 NOTE — Telephone Encounter (Signed)
Received voicemail from RN with [facility] stating they had some questions about mutual pt [name] [name]. Did not leave any other details except return phone # of [phone].  Returned call to [facility] and left a message.

## 2019-08-22 ENCOUNTER — Telehealth: Payer: Self-pay | Admitting: Pharmacist

## 2019-08-22 NOTE — Telephone Encounter (Signed)
I agree that the patient has compliance issues.  I have only seen her once and we changed her from Xarelto to warfarin at that time.  Apparently she has been prescribed Xarelto in the past but did not pick it up because it was going to be cost prohibitive.  We could see if she qualifies for a patient assistance program but we certainly would not be able to provide her months of Xarelto samples.  She does have some compliance issues so I am not sure whether warfarin is actually going to work out or not.  I am scheduled to see her back in several months.

## 2019-08-22 NOTE — Telephone Encounter (Signed)
An RN from patient's nephrologist office (Dr. Franchot Gallo) called with concerns over patients anticoagulation. She is concerned that patient is taking Xarelto 20mg  incorrectly as she states she is taking it twice a day. She also is concerned that she is on a DOAC and instead of warfarin due to her antiphospholipid syndrome.   Coumadin clinic did not advise the change- per patient lupus dr is ok with Xarelto was just changed due to cost- cannot confirm this. We simply helped with the transition and getting her patient assistance through drug company.  I have tried to call the patient to discuss the proper way of taking Xarelto (once daily). Left VM for patient to return call. She is also to see Dr. Acie Fredrickson on 12/16 and will educate her again there.

## 2019-08-22 NOTE — Telephone Encounter (Signed)
Patient does have patient assistance through [employer] and did receive the drug and we did switch her over to Xarelto. It appears she reported to her nephrologist that she was taking twice a day which concerned the nephrologist. The MD was also questioning the use of DOAC compared to warfarin. You are scheduled to see her on Wed. I would be more than happy to speak with the patient at that appointment. I also called her, but no call back yet.

## 2019-08-23 NOTE — Telephone Encounter (Signed)
Spoke with patient- states the nephrologist office also called her yesterday to tell her she should be taking Xarelto once a day. She was taking it twice a day.  I believe the confusion was because when she was discharged from the hospital the treatment dose for DVT for xarelto is 15mg  twice a day. But now we are using the preventative dose 20mg  daily. Patient states she is taking once a day now and does not have any issues getting medication from [facility].

## 2019-08-28 ENCOUNTER — Ambulatory Visit (INDEPENDENT_AMBULATORY_CARE_PROVIDER_SITE_OTHER): Payer: Self-pay | Admitting: Cardiovascular Disease

## 2019-08-28 ENCOUNTER — Encounter: Payer: Self-pay | Admitting: Cardiovascular Disease

## 2019-08-28 ENCOUNTER — Other Ambulatory Visit: Payer: Self-pay

## 2019-08-28 VITALS — BP 130/98 | HR 73 | Ht 63.0 in | Wt 149.0 lb

## 2019-08-28 DIAGNOSIS — I10 Essential (primary) hypertension: Secondary | ICD-10-CM

## 2019-08-28 DIAGNOSIS — I824Y1 Acute embolism and thrombosis of unspecified deep veins of right proximal lower extremity: Secondary | ICD-10-CM

## 2019-08-28 DIAGNOSIS — M329 Systemic lupus erythematosus, unspecified: Secondary | ICD-10-CM

## 2019-08-28 DIAGNOSIS — I2699 Other pulmonary embolism without acute cor pulmonale: Secondary | ICD-10-CM

## 2019-08-28 NOTE — Progress Notes (Signed)
Cardiology Office Note:    Date:  08/28/2019   ID:  [name] [name], DOB 06-Oct-1995, MRN [id number]  PCP:  [name], FNP  Cardiologist:  Karri Kallenbach  Electrophysiologist:  None   Referring MD: [name], FNP   Chief Complaint  Patient presents with  . Hypertension  . Anticoagulation     History of Present Illness:    [name] [name] is a 23 y.o. female with a hx of SIRS, UTI, DVT and sinus tachycardia.     Pt has has a hx of Lupus.   Has been on steroids and hydroxychloroquine for about 6 years.  She has been recently found to have some renal abnormalities and sees a nephrologist at [facility].  Was seen by Dr. Posey Pronto at [facility].  . She was seen at [facility] for routine checkup but they noticed that she had a DVT which had been going on to the, pulmonary embolus.  She was started on Lovenox as well as warfarin.  She is here today to establish care to be monitored in the Coumadin clinic.  She  Was  on Xarelto for her DVT but is going to be cost prohibitive.  It was going to cost her 700 hours a month.  She transitioned to warfarin.  While she was in the hospital at [facility], they started her on warfarin and gave her Lovenox injections for 7 days.   She has taken the warfarin for the past 4 days.   Continues to have pleuretic CP   Hx of SLE.  Raynauds phenomenon, HTN, gout.  Was on birth controls.    Breathing is ok Has DOE  Is not very active.  Advised her to exercise   Dec. 16, 2020   Seen for follow up  Hx of DVT, PE. Still eats processed foods.  Has not seen hematology.    Past Medical History:  Diagnosis Date  . Accidental methotrexate overdose 01/08/2018  . Acute lower UTI   . Bronchitis 03/19/2019  . Cardiomegaly 05/08/2019  . Chronic midline low back pain without sciatica 11/09/2016   Facet joint arthropathy  . Dehydration 03/19/2019  . Depo-Provera contraceptive status 08/11/2015  . Depression 02/08/2017  . Dysphagia 06/01/2018  . Dysuria 04/19/2019  . Elevated  AST (SGOT) 03/19/2019  . Essential hypertension 03/19/2019  . Facial edema   . Gout   . Hypertension   . IDA (iron deficiency anemia) 08/11/2015  . Immunosuppressed status ([place])   . Insomnia 01/29/2016  . Leukopenia 01/10/2018  . Lobar pneumonia ([place]) 06/07/2018  . Lupus ([place])   . Lupus (systemic lupus erythematosus) ([place]) 03/19/2019  . Metabolic acidosis [id number]  . Methotrexate toxicity   . Mouth ulcers   . Mucositis oral 01/12/2018  . Odynophagia 06/01/2018  . Oral thrush 03/19/2019  . Pleuritic chest pain 06/05/2018  . Primary osteoarthritis of both knees 11/09/2016  . Rash due to systemic lupus erythematosus (SLE) ([place])   . Raynaud disease   . Raynaud's disease 08/11/2015  . Relative acute adrenal insufficiency ([place]) 06/11/2018  . Right leg DVT ([place]) 05/08/2019  . Seasonal allergies 08/11/2015  . Sepsis ([place]) 05/08/2019  . Sinus tachycardia 03/19/2019  . SLE (systemic lupus erythematosus) ([place]) 09/19/2016     Positive ANA, double-stranded DNA, Ro, RNP, RF, history of nasal ulcers, fatigue, anemia, arthritis, Raynauds  . Thrush 06/01/2018  . Vaginal candidiasis 01/12/2018  . Vitamin D deficiency 11/09/2016    Past Surgical History:  Procedure Laterality Date  . ORIF SHOULDER FRACTURE Left   .  TONSILLECTOMY      Current Medications: Current Meds  Medication Sig  . amLODipine (NORVASC) 10 MG tablet Take 1 tablet (10 mg total) by mouth daily.  Marland Kitchen buPROPion (WELLBUTRIN XL) 150 MG 24 hr tablet Take 1 tablet (150 mg total) by mouth daily.  . cyclobenzaprine (FLEXERIL) 10 MG tablet Take 1 tablet (10 mg total) by mouth 2 (two) times daily as needed for muscle spasms.  . diphenhydrAMINE (BENADRYL) 25 MG tablet Take 25 mg by mouth every 6 (six) hours as needed for allergies.  . ferrous sulfate 325 (65 FE) MG tablet Take 1 tablet (325 mg total) by mouth 2 (two) times daily. With 1/2 glass of orange juice  . folic acid (FOLVITE) 1 MG tablet Take 1 tablet (1 mg total) by mouth at bedtime.  . gabapentin  (NEURONTIN) 300 MG capsule Take 1 capsule (300 mg total) by mouth 3 (three) times daily.  Marland Kitchen loperamide (IMODIUM) 2 MG capsule Take 1 capsule (2 mg total) by mouth as needed for diarrhea or loose stools.  . metoprolol tartrate (LOPRESSOR) 25 MG tablet Take 1 tablet (25 mg total) by mouth 2 (two) times daily.  . Multiple Vitamins-Minerals (MULTIVITAMIN ADULT PO) Take 1 tablet by mouth daily.  . mupirocin ointment (BACTROBAN) 2 % PLACE 1 APPLICATION INTO THE NOSE 2 (TWO) TIMES DAILY.  Marland Kitchen nystatin (MYCOSTATIN) 100000 UNIT/ML suspension Take 5 mLs (500,000 Units total) by mouth 2 (two) times daily as needed.  . pantoprazole (PROTONIX) 20 MG tablet Take 1 tablet (20 mg total) by mouth at bedtime.  . rivaroxaban (XARELTO) 20 MG TABS tablet Take 1 tablet (20 mg total) by mouth daily with supper.  . traZODone (DESYREL) 100 MG tablet Take 1 tablet (100 mg total) by mouth at bedtime.  . Vitamin D, Ergocalciferol, (DRISDOL) 50000 units CAPS capsule Take 1 capsule (50,000 Units total) by mouth 2 (two) times a week.     Allergies:   Hydroxychloroquine and Latex   Social History   Socioeconomic History  . Marital status: Single    Spouse name: Not on file  . Number of children: Not on file  . Years of education: Not on file  . Highest education level: Not on file  Occupational History  . Not on file  Tobacco Use  . Smoking status: Never Smoker  . Smokeless tobacco: Never Used  Substance and Sexual Activity  . Alcohol use: No  . Drug use: No  . Sexual activity: Not Currently  Other Topics Concern  . Not on file  Social History Narrative  . Not on file   Social Determinants of Health   Financial Resource Strain:   . Difficulty of Paying Living Expenses: Not on file  Food Insecurity:   . Worried About Charity fundraiser in the Last Year: Not on file  . Ran Out of Food in the Last Year: Not on file  Transportation Needs:   . Lack of Transportation (Medical): Not on file  . Lack of  Transportation (Non-Medical): Not on file  Physical Activity:   . Days of Exercise per Week: Not on file  . Minutes of Exercise per Session: Not on file  Stress:   . Feeling of Stress : Not on file  Social Connections:   . Frequency of Communication with Friends and Family: Not on file  . Frequency of Social Gatherings with Friends and Family: Not on file  . Attends Religious Services: Not on file  . Active Member of Clubs or  Organizations: Not on file  . Attends Archivist Meetings: Not on file  . Marital Status: Not on file     Family History: The patient's family history includes Aneurysm in her mother; Heart attack in her paternal uncle; Lung cancer in her maternal aunt; Seizures in her sister; Stroke in her sister. There is no history of Colon cancer, Esophageal cancer, Pancreatic cancer, or Stomach cancer.  ROS:   Please see the history of present illness.     All other systems reviewed and are negative.  EKGs/Labs/Other Studies Reviewed:    The following studies were reviewed today:   EKG:    Recent Labs: 05/09/2019: TSH 3.309 05/11/2019: ALT 16; BUN 18; Creatinine, Ser 0.55; Hemoglobin 9.0; Magnesium 2.0; Platelets 212; Potassium 4.1; Sodium 138  Recent Lipid Panel No results found for: CHOL, TRIG, HDL, CHOLHDL, VLDL, LDLCALC, LDLDIRECT  Physical Exam:    Physical Exam: Blood pressure (!) 130/98, pulse 73, height 5\' 3"  (1.6 m), weight 149 lb (67.6 kg), SpO2 99 %.  GEN: Chronically very ill appearing young female. HEENT: Normal NECK: No JVD; No carotid bruits LYMPHATICS: No lymphadenopathy CARDIAC: RRR , soft systolic murmur. RESPIRATORY:  Clear to auscultation without rales, wheezing or rhonchi  ABDOMEN: Soft, non-tender, non-distended MUSCULOSKELETAL:  No edema; No deformity  SKIN: Warm and dry NEUROLOGIC:  Alert and oriented x 3   ASSESSMENT:    1. Pulmonary embolism, unspecified chronicity, unspecified pulmonary embolism type, unspecified  whether acute cor pulmonale present ([place])   2. Acute deep vein thrombosis (DVT) of proximal vein of right lower extremity (HCC)   3. Systemic lupus erythematosus, unspecified SLE type, unspecified organ involvement status ([place])    PLAN:    In order of problems listed above:  DVT/pulmonary embolus: The patient presents with a history of lupus.  She was recently found to have a DVT and pulmonary emboli.  She has been on warfarin in the past.  She is currently on Xarelto.  I told her that she will likely need Xarelto or other anticoagulation lifelong but I would like to send her to hematology for a hypercoagulable work-up.  She was on estrogen birth control when she had this DVT/PE.  We will have her see hematology to help [place] decide the duration of her anticoagulation.  Will have see Richardson Dopp, PA in 6 months.   Medication Adjustments/Labs and Tests Ordered: Current medicines are reviewed at length with the patient today.  Concerns regarding medicines are outlined above.  Orders Placed This Encounter  Procedures  . Ambulatory referral to Hematology   No orders of the defined types were placed in this encounter.    Patient Instructions  Medication Instructions:  Your physician recommends that you continue on your current medications as directed. Please refer to the Current Medication list given to you today.  *If you need a refill on your cardiac medications before your next appointment, please call your pharmacy*  Lab Work: None Ordered   Testing/Procedures: None Ordered   Follow-Up: You have been referred to Hematology for evaluation of your blood clotting issues   At [facility], you and your health needs are our priority.  As part of our continuing mission to provide you with exceptional heart care, we have created designated Provider Care Teams.  These Care Teams include your primary Cardiologist (physician) and Advanced Practice Providers (APPs -  Physician Assistants  and Nurse Practitioners) who all work together to provide you with the care you need, when you need  it.  Your next appointment:   6 month(s)  The format for your next appointment:   Either In Person or Virtual  Provider:   Richardson Dopp, PA-C, Robbie Lis, PA-C or Daune Perch, NP       Signed, Mertie Moores, MD  08/28/2019 5:28 PM    [place]

## 2019-08-28 NOTE — Patient Instructions (Signed)
Medication Instructions:  Your physician recommends that you continue on your current medications as directed. Please refer to the Current Medication list given to you today.  *If you need a refill on your cardiac medications before your next appointment, please call your pharmacy*  Lab Work: None Ordered   Testing/Procedures: None Ordered   Follow-Up: You have been referred to Hematology for evaluation of your blood clotting issues   At [facility], you and your health needs are our priority.  As part of our continuing mission to provide you with exceptional heart care, we have created designated Provider Care Teams.  These Care Teams include your primary Cardiologist (physician) and Advanced Practice Providers (APPs -  Physician Assistants and Nurse Practitioners) who all work together to provide you with the care you need, when you need it.  Your next appointment:   6 month(s)  The format for your next appointment:   Either In Person or Virtual  Provider:   Richardson Dopp, PA-C, Robbie Lis, PA-C or Daune Perch, NP

## 2019-08-29 ENCOUNTER — Telehealth: Payer: Self-pay | Admitting: Family

## 2019-08-29 NOTE — Telephone Encounter (Signed)
Called and spoke with patient regarding New Patient appointment from a referral from Cardiology.  She was given date/time/location.  She voiced understanding per 12/17 sch msg

## 2019-09-12 ENCOUNTER — Encounter: Payer: Self-pay | Admitting: Family

## 2019-09-12 ENCOUNTER — Other Ambulatory Visit: Payer: Self-pay

## 2019-09-12 ENCOUNTER — Telehealth: Payer: Self-pay | Admitting: Family

## 2019-09-12 ENCOUNTER — Inpatient Hospital Stay: Payer: Self-pay | Attending: Family

## 2019-09-12 ENCOUNTER — Inpatient Hospital Stay (HOSPITAL_BASED_OUTPATIENT_CLINIC_OR_DEPARTMENT_OTHER): Payer: Self-pay | Admitting: Family

## 2019-09-12 VITALS — BP 120/82 | HR 102 | Temp 97.5°F | Resp 18 | Ht 63.0 in | Wt 147.4 lb

## 2019-09-12 DIAGNOSIS — I7300 Raynaud's syndrome without gangrene: Secondary | ICD-10-CM

## 2019-09-12 DIAGNOSIS — I82411 Acute embolism and thrombosis of right femoral vein: Secondary | ICD-10-CM | POA: Insufficient documentation

## 2019-09-12 DIAGNOSIS — M329 Systemic lupus erythematosus, unspecified: Secondary | ICD-10-CM | POA: Insufficient documentation

## 2019-09-12 DIAGNOSIS — I2699 Other pulmonary embolism without acute cor pulmonale: Secondary | ICD-10-CM

## 2019-09-12 DIAGNOSIS — I10 Essential (primary) hypertension: Secondary | ICD-10-CM

## 2019-09-12 DIAGNOSIS — I82441 Acute embolism and thrombosis of right tibial vein: Secondary | ICD-10-CM

## 2019-09-12 DIAGNOSIS — D6859 Other primary thrombophilia: Secondary | ICD-10-CM

## 2019-09-12 DIAGNOSIS — Z808 Family history of malignant neoplasm of other organs or systems: Secondary | ICD-10-CM | POA: Insufficient documentation

## 2019-09-12 DIAGNOSIS — I82461 Acute embolism and thrombosis of right calf muscular vein: Secondary | ICD-10-CM

## 2019-09-12 DIAGNOSIS — I82451 Acute embolism and thrombosis of right peroneal vein: Secondary | ICD-10-CM | POA: Insufficient documentation

## 2019-09-12 DIAGNOSIS — I82431 Acute embolism and thrombosis of right popliteal vein: Secondary | ICD-10-CM | POA: Insufficient documentation

## 2019-09-12 LAB — CBC WITH DIFFERENTIAL (CANCER CENTER ONLY)
Abs Immature Granulocytes: 0.18 10*3/uL — ABNORMAL HIGH (ref 0.00–0.07)
Basophils Absolute: 0 10*3/uL (ref 0.0–0.1)
Basophils Relative: 0 %
Eosinophils Absolute: 0 10*3/uL (ref 0.0–0.5)
Eosinophils Relative: 0 %
HCT: 35.2 % — ABNORMAL LOW (ref 36.0–46.0)
Hemoglobin: 11.3 g/dL — ABNORMAL LOW (ref 12.0–15.0)
Immature Granulocytes: 4 %
Lymphocytes Relative: 6 %
Lymphs Abs: 0.3 10*3/uL — ABNORMAL LOW (ref 0.7–4.0)
MCH: 27.6 pg (ref 26.0–34.0)
MCHC: 32.1 g/dL (ref 30.0–36.0)
MCV: 86.1 fL (ref 80.0–100.0)
Monocytes Absolute: 0.2 10*3/uL (ref 0.1–1.0)
Monocytes Relative: 5 %
Neutro Abs: 4.1 10*3/uL (ref 1.7–7.7)
Neutrophils Relative %: 85 %
Platelet Count: 200 10*3/uL (ref 150–400)
RBC: 4.09 MIL/uL (ref 3.87–5.11)
RDW: 15 % (ref 11.5–15.5)
WBC Count: 4.8 10*3/uL (ref 4.0–10.5)
nRBC: 0 % (ref 0.0–0.2)

## 2019-09-12 LAB — CMP (CANCER CENTER ONLY)
ALT: 19 U/L (ref 0–44)
AST: 26 U/L (ref 15–41)
Albumin: 3.2 g/dL — ABNORMAL LOW (ref 3.5–5.0)
Alkaline Phosphatase: 81 U/L (ref 38–126)
Anion gap: 7 (ref 5–15)
BUN: 14 mg/dL (ref 6–20)
CO2: 27 mmol/L (ref 22–32)
Calcium: 9.1 mg/dL (ref 8.9–10.3)
Chloride: 105 mmol/L (ref 98–111)
Creatinine: 0.57 mg/dL (ref 0.44–1.00)
GFR, Est AFR Am: >60 mL/min (ref >60)
GFR, Estimated: >60 mL/min (ref >60)
Glucose, Bld: 97 mg/dL (ref 70–99)
Potassium: 3.9 mmol/L (ref 3.5–5.1)
Sodium: 139 mmol/L (ref 135–145)
Total Bilirubin: 0.2 mg/dL — ABNORMAL LOW (ref 0.3–1.2)
Total Protein: 6.8 g/dL (ref 6.5–8.1)

## 2019-09-12 LAB — IRON AND TIBC
Iron: 41 ug/dL (ref 41–142)
Saturation Ratios: 14 % — ABNORMAL LOW (ref 21–57)
TIBC: 298 ug/dL (ref 236–444)
UIBC: 256 ug/dL (ref 120–384)

## 2019-09-12 LAB — RETICULOCYTES
Immature Retic Fract: 17.6 % — ABNORMAL HIGH (ref 2.3–15.9)
RBC.: 4.06 MIL/uL (ref 3.87–5.11)
Retic Count, Absolute: 56.4 10*3/uL (ref 19.0–186.0)
Retic Ct Pct: 1.4 % (ref 0.4–3.1)

## 2019-09-12 LAB — FERRITIN: Ferritin: 52 ng/mL (ref 11–307)

## 2019-09-12 LAB — SAVE SMEAR(SSMR), FOR PROVIDER SLIDE REVIEW

## 2019-09-12 LAB — LACTATE DEHYDROGENASE: LDH: 289 U/L — ABNORMAL HIGH (ref 98–192)

## 2019-09-12 LAB — ANTITHROMBIN III: AntiThromb III Func: 130 % — ABNORMAL HIGH (ref 75–120)

## 2019-09-12 NOTE — Telephone Encounter (Signed)
lmom to inform patient of [place] 09/19/19 at 430 pm and follow up 2/25 at 0845 per 12/31 LOS

## 2019-09-12 NOTE — Progress Notes (Signed)
Hematology/Oncology Consultation   Name: REBEKKAH GOLA      MRN: [id number]    Location: Room/bed info not found  Date: 09/12/2019 Time:10:24 AM   REFERRING PHYSICIAN: Mertie Moores, MD  REASON FOR CONSULT: Pulmonary embolism and DVT of the right lower extremity    DIAGNOSIS: DVT of the right lower extremity by [place] and VQ scan indeterminate of PE in August/September 2020  HISTORY OF PRESENT ILLNESS: Ms. Gleckler is a very pleasant 23 yo African American female with history of systemic lupus erythematosus and Raynaud's.  She is currently on Prednisone and hydroxychloroquine.  She was diagnosed with acute DVT of the right femoral vein, right popliteal vein, right posterior tibial veins, right peroneal veins, right soleal veins, and right gastrocnemius veins in August and VQ scan was indeterminate for PE in September.  She still has some mild SOB with over exertion and will take a break to rest when needed.  She is doing well on Xarelto. Her cycle has been heavier with clots but remains regular.  No other blood loss noted. No bruising or petechiae.  She was previously on Depo for birth control but has since stopped and will be following up with gynecology to discuss progesterone only options.  She has not children and no history miscarriage.   She has some pain in her right foot she states is due to gout.  She is followed by cardiology for hypertension and nephrology for lupus nephritis and proteinuria.  No sickle cell disease or trait.  Her maternal grandmother also has Lupus.  No family history of thrombosis.  Her mother has history of 3 aneurysms.  No personal cancer history. She had a maternal aunt with esophageal cancer (smoker).  No fever, n/v, cough, dizziness, chest pain, palpitations, abdominal pain or changes in bowel or bladder habits.  She does take a Dulcolax stool softener as needed for constipation.  She does get the facial rash with Lupus flares. This comes and goes and she states  is effected by the changes in the weather.  She has an ulcer in her left nare that will sometimes bleed a little.  Due to the Raynaud's she has swelling, tenderness, numbness and tingling that comes and goes in her extremities. She is careful to stay bundled up as best she can to rpevent flares.  No falls or syncopal episodes to report.  She has maintained a good appetite and is staying well hydrated. Her weight is stable.   She does not smoke, use recreational drugs or drink alcoholic beverages.  She is off work right now due to [employer] but previously worked for [employer].   ROS: All other 10 point review of systems is negative.   PAST MEDICAL HISTORY:   Past Medical History:  Diagnosis Date  . Accidental methotrexate overdose 01/08/2018  . Acute lower UTI   . Bronchitis 03/19/2019  . Cardiomegaly 05/08/2019  . Chronic midline low back pain without sciatica 11/09/2016   Facet joint arthropathy  . Dehydration 03/19/2019  . Depo-Provera contraceptive status 08/11/2015  . Depression 02/08/2017  . Dysphagia 06/01/2018  . Dysuria 04/19/2019  . Elevated AST (SGOT) 03/19/2019  . Essential hypertension 03/19/2019  . Facial edema   . Gout   . Hypertension   . IDA (iron deficiency anemia) 08/11/2015  . Immunosuppressed status ([place])   . Insomnia 01/29/2016  . Leukopenia 01/10/2018  . Lobar pneumonia ([place]) 06/07/2018  . Lupus ([place])   . Lupus (systemic lupus erythematosus) ([place]) 03/19/2019  . Metabolic  acidosis 03/19/2019  . Methotrexate toxicity   . Mouth ulcers   . Mucositis oral 01/12/2018  . Odynophagia 06/01/2018  . Oral thrush 03/19/2019  . Pleuritic chest pain 06/05/2018  . Primary osteoarthritis of both knees 11/09/2016  . Rash due to systemic lupus erythematosus (SLE) ([place])   . Raynaud disease   . Raynaud's disease 08/11/2015  . Relative acute adrenal insufficiency ([place]) 06/11/2018  . Right leg DVT ([place]) 05/08/2019  . Seasonal allergies 08/11/2015  . Sepsis ([place]) 05/08/2019  . Sinus tachycardia  03/19/2019  . SLE (systemic lupus erythematosus) ([place]) 09/19/2016     Positive ANA, double-stranded DNA, Ro, RNP, RF, history of nasal ulcers, fatigue, anemia, arthritis, Raynauds  . Thrush 06/01/2018  . Vaginal candidiasis 01/12/2018  . Vitamin D deficiency 11/09/2016    ALLERGIES: Allergies  Allergen Reactions  . Hydroxychloroquine Rash  . Latex Rash      MEDICATIONS:  Current Outpatient Medications on File Prior to Visit  Medication Sig Dispense Refill  . amLODipine (NORVASC) 10 MG tablet Take 1 tablet (10 mg total) by mouth daily. 30 tablet 0  . buPROPion (WELLBUTRIN XL) 150 MG 24 hr tablet Take 1 tablet (150 mg total) by mouth daily. 30 tablet 0  . cyclobenzaprine (FLEXERIL) 10 MG tablet Take 1 tablet (10 mg total) by mouth 2 (two) times daily as needed for muscle spasms. 10 tablet 0  . diphenhydrAMINE (BENADRYL) 25 MG tablet Take 25 mg by mouth every 6 (six) hours as needed for allergies.    . ferrous sulfate 325 (65 FE) MG tablet Take 1 tablet (325 mg total) by mouth 2 (two) times daily. With 1/2 glass of orange juice 30 tablet 0  . folic acid (FOLVITE) 1 MG tablet Take 1 tablet (1 mg total) by mouth at bedtime. 30 tablet 0  . gabapentin (NEURONTIN) 300 MG capsule Take 1 capsule (300 mg total) by mouth 3 (three) times daily. 90 capsule 3  . loperamide (IMODIUM) 2 MG capsule Take 1 capsule (2 mg total) by mouth as needed for diarrhea or loose stools. 10 capsule 0  . metoprolol tartrate (LOPRESSOR) 25 MG tablet Take 1 tablet (25 mg total) by mouth 2 (two) times daily. 30 tablet 0  . Multiple Vitamins-Minerals (MULTIVITAMIN ADULT PO) Take 1 tablet by mouth daily.    . mupirocin ointment (BACTROBAN) 2 % PLACE 1 APPLICATION INTO THE NOSE 2 (TWO) TIMES DAILY. 22 g 0  . nystatin (MYCOSTATIN) 100000 UNIT/ML suspension Take 5 mLs (500,000 Units total) by mouth 2 (two) times daily as needed. 60 mL 3  . pantoprazole (PROTONIX) 20 MG tablet Take 1 tablet (20 mg total) by mouth at bedtime. 20 tablet  3  . rivaroxaban (XARELTO) 20 MG TABS tablet Take 1 tablet (20 mg total) by mouth daily with supper. 90 tablet 1  . traZODone (DESYREL) 100 MG tablet Take 1 tablet (100 mg total) by mouth at bedtime. 30 tablet 3  . Vitamin D, Ergocalciferol, (DRISDOL) 50000 units CAPS capsule Take 1 capsule (50,000 Units total) by mouth 2 (two) times a week. 24 capsule 0   No current facility-administered medications on file prior to visit.     PAST SURGICAL HISTORY Past Surgical History:  Procedure Laterality Date  . ORIF SHOULDER FRACTURE Left   . TONSILLECTOMY      FAMILY HISTORY: Family History  Problem Relation Age of Onset  . Aneurysm Mother   . Stroke Sister   . Seizures Sister   . Lung cancer Maternal Aunt   .  Heart attack Paternal Uncle   . Colon cancer Neg Hx   . Esophageal cancer Neg Hx   . Pancreatic cancer Neg Hx   . Stomach cancer Neg Hx     SOCIAL HISTORY:  reports that she has never smoked. She has never used smokeless tobacco. She reports that she does not drink alcohol or use drugs.  PERFORMANCE STATUS: The patient's performance status is 1 - Symptomatic but completely ambulatory  PHYSICAL EXAM: Most Recent Vital Signs: Blood pressure 120/82, pulse (!) 102, temperature (!) 97.5 F (36.4 C), temperature source Temporal, resp. rate 18, height 5\' 3"  (1.6 m), weight 147 lb 6.4 oz (66.9 kg), SpO2 98 %. BP 120/82 (BP Location: Right Arm, Patient Position: Sitting)   Pulse (!) 102   Temp (!) 97.5 F (36.4 C) (Temporal)   Resp 18   Ht 5\' 3"  (1.6 m)   Wt 147 lb 6.4 oz (66.9 kg)   SpO2 98%   BMI 26.11 kg/m   General Appearance:    Alert, cooperative, no distress, appears stated age  Head:    Normocephalic, without obvious abnormality, atraumatic  Eyes:    PERRL, conjunctiva/corneas clear, EOM's intact, fundi    benign, both eyes        Throat:   Lips, mucosa, and tongue normal; teeth and gums normal  Neck:   Supple, symmetrical, trachea midline, no adenopathy;     thyroid:  no enlargement/tenderness/nodules; no carotid   bruit or JVD  Back:     Symmetric, no curvature, ROM normal, no CVA tenderness  Lungs:     Clear to auscultation bilaterally, respirations unlabored  Chest Wall:    No tenderness or deformity   Heart:    Regular rate and rhythm, S1 and S2 normal, no murmur, rub   or gallop     Abdomen:     Soft, non-tender, bowel sounds active all four quadrants,    no masses, no organomegaly        Extremities:   Extremities normal, atraumatic, no cyanosis or edema  Pulses:   2+ and symmetric all extremities  Skin:   Skin color, texture, turgor normal, no rashes or lesions  Lymph nodes:   Cervical, supraclavicular, and axillary nodes normal  Neurologic:   CNII-XII intact, normal strength, sensation and reflexes    throughout    LABORATORY DATA:  Results for orders placed or performed in visit on 09/12/19 (from the past 48 hour(s))  CBC with Differential (Cancer Center Only)     Status: Abnormal   Collection Time: 09/12/19  8:52 AM  Result Value Ref Range   WBC Count 4.8 4.0 - 10.5 K/uL   RBC 4.09 3.87 - 5.11 MIL/uL   Hemoglobin 11.3 (L) 12.0 - 15.0 g/dL   HCT 35.2 (L) 36.0 - 46.0 %   MCV 86.1 80.0 - 100.0 fL   MCH 27.6 26.0 - 34.0 pg   MCHC 32.1 30.0 - 36.0 g/dL   RDW 15.0 11.5 - 15.5 %   Platelet Count 200 150 - 400 K/uL   nRBC 0.0 0.0 - 0.2 %   Neutrophils Relative % 85 %   Neutro Abs 4.1 1.7 - 7.7 K/uL   Lymphocytes Relative 6 %   Lymphs Abs 0.3 (L) 0.7 - 4.0 K/uL   Monocytes Relative 5 %   Monocytes Absolute 0.2 0.1 - 1.0 K/uL   Eosinophils Relative 0 %   Eosinophils Absolute 0.0 0.0 - 0.5 K/uL   Basophils Relative 0 %  Basophils Absolute 0.0 0.0 - 0.1 K/uL   Immature Granulocytes 4 %   Abs Immature Granulocytes 0.18 (H) 0.00 - 0.07 K/uL    Comment: Performed at [facility] at [facility], [address], [place], [place] [address]  Save Smear [facility])     Status: None   Collection Time: 09/12/19   8:52 AM  Result Value Ref Range   Smear Review SMEAR STAINED AND AVAILABLE FOR REVIEW     Comment: Performed at [facility] at [facility], [address] [place], [place] [address]  CMP ([facility] only)     Status: Abnormal   Collection Time: 09/12/19  8:52 AM  Result Value Ref Range   Sodium 139 135 - 145 mmol/L   Potassium 3.9 3.5 - 5.1 mmol/L   Chloride 105 98 - 111 mmol/L   CO2 27 22 - 32 mmol/L   Glucose, Bld 97 70 - 99 mg/dL   BUN 14 6 - 20 mg/dL   Creatinine 0.57 0.44 - 1.00 mg/dL   Calcium 9.1 8.9 - 10.3 mg/dL   Total Protein 6.8 6.5 - 8.1 g/dL   Albumin 3.2 (L) 3.5 - 5.0 g/dL   AST 26 15 - 41 U/L   ALT 19 0 - 44 U/L   Alkaline Phosphatase 81 38 - 126 U/L   Total Bilirubin 0.2 (L) 0.3 - 1.2 mg/dL   GFR, Est Non Af Am >60 >60 mL/min   GFR, Est AFR Am >60 >60 mL/min   Anion gap 7 5 - 15    Comment: Performed at [facility] at [facility], [address] [place], [place] [address]  Reticulocytes     Status: Abnormal   Collection Time: 09/12/19  8:53 AM  Result Value Ref Range   Retic Ct Pct 1.4 0.4 - 3.1 %   RBC. 4.06 3.87 - 5.11 MIL/uL   Retic Count, Absolute 56.4 19.0 - 186.0 K/uL   Immature Retic Fract 17.6 (H) 2.3 - 15.9 %    Comment: Performed at [facility] at [facility], [address], [place], [place] [address]      RADIOGRAPHY: No results found.     PATHOLOGY: None  ASSESSMENT/PLAN: Ms. Braggs is a very pleasant 23 yo African American female with history of systemic lupus erythematosus and Raynaud's. She was diagnosed with acute DVT of the right femoral vein, right popliteal vein, right posterior tibial veins, right peroneal veins, right soleal veins, and right gastrocnemius veins in August and VQ scan was indeterminate for PE in September.  We will have her complete 6 months of full dose anticoagulation with Xarelto and then 1 year of maintenance. She also verbalized  understanding that she will need to be on Lovenox during and 6 weeks after pregnancy should she decide to have children.   We will see her back in 2 months for follow-up and get a repeat US of the right lower extremity in 2 weeks.   All questions were answered and she is sin agreement with the plan. She will contact our office with any questions or concerns. We can certainly see the patient much sooner if necessary.  She was discussed with and also seen by Dr. Marin Olp and he is in agreement with the aforementioned.   Laverna Peace, NP    Addendum: I saw and examined Ms. Proano with Judson Roch.  I must say that  she is very charming.  For somebody so young, and she certainly does have her fair share of problems.  Clearly, the lupus is the biggest of the issues.  I am sure that we will probably not find an etiology for the thromboembolic disease.  Typically, with lupus, there can be bleeding.  I know that there can be the development of a lupus anticoagulant.  We will see if she has a thrombophilic state.  We will keep her on Xarelto for at least 6 months.  After that, if everything looks fine, there may be we will have her on maintenance Xarelto 10 mg p.o. daily for 1 year.  Because she is young, I am sure that she will want to have children.  If, at some point in the future, she becomes pregnant, she will need to be on therapeutic Lovenox.  I realize that with pregnancy, the lupus may also flareup.  I would like to get another Doppler of her right leg before we see her back.  I would like to see if this thrombus is improving.  We spent about 45 minutes with her today.  She is very nice.  Again she has been through quite a lot given her young age.  I would like to see her back in 6 weeks.

## 2019-09-14 LAB — HOMOCYSTEINE: Homocysteine: 7.1 umol/L (ref 0.0–14.5)

## 2019-09-15 LAB — ERYTHROPOIETIN: Erythropoietin: 13.3 m[IU]/mL (ref 2.6–18.5)

## 2019-09-16 LAB — PROTEIN S, TOTAL: Protein S Ag, Total: 81 % (ref 60–150)

## 2019-09-16 LAB — PROTEIN C ACTIVITY: Protein C Activity: 182 % — ABNORMAL HIGH (ref 73–180)

## 2019-09-16 LAB — PROTEIN S ACTIVITY: Protein S Activity: 69 % (ref 63–140)

## 2019-09-17 ENCOUNTER — Other Ambulatory Visit: Payer: Self-pay | Admitting: Family

## 2019-09-17 LAB — FACTOR 5 LEIDEN

## 2019-09-17 LAB — PROTEIN C, TOTAL: Protein C, Total: 176 % — ABNORMAL HIGH (ref 60–150)

## 2019-09-17 LAB — DRVVT CONFIRM: dRVVT Confirm: 2 ratio — ABNORMAL HIGH (ref 0.8–1.2)

## 2019-09-17 LAB — CARDIOLIPIN ANTIBODIES, IGG, IGM, IGA
Anticardiolipin IgA: <9 APL U/mL (ref 0–11)
Anticardiolipin IgG: <9 GPL U/mL (ref 0–14)
Anticardiolipin IgM: 25 MPL U/mL — ABNORMAL HIGH (ref 0–12)

## 2019-09-17 LAB — BETA-2-GLYCOPROTEIN I ABS, IGG/M/A
Beta-2 Glyco I IgG: <9 GPI IgG units (ref 0–20)
Beta-2-Glycoprotein I IgA: <9 GPI IgA units (ref 0–25)
Beta-2-Glycoprotein I IgM: <9 GPI IgM units (ref 0–32)

## 2019-09-17 LAB — LUPUS ANTICOAGULANT PANEL
DRVVT: 109.6 s — ABNORMAL HIGH (ref 0.0–47.0)
PTT Lupus Anticoagulant: 43.3 s (ref 0.0–51.9)

## 2019-09-17 LAB — DRVVT MIX: dRVVT Mix: 73.1 s — ABNORMAL HIGH (ref 0.0–40.4)

## 2019-09-18 LAB — PROTHROMBIN GENE MUTATION

## 2019-09-19 ENCOUNTER — Ambulatory Visit (HOSPITAL_BASED_OUTPATIENT_CLINIC_OR_DEPARTMENT_OTHER): Admission: RE | Admit: 2019-09-19 | Payer: Self-pay | Source: Ambulatory Visit

## 2019-09-19 ENCOUNTER — Encounter (HOSPITAL_BASED_OUTPATIENT_CLINIC_OR_DEPARTMENT_OTHER): Payer: Self-pay

## 2019-09-19 ENCOUNTER — Other Ambulatory Visit: Payer: Self-pay

## 2019-09-23 ENCOUNTER — Telehealth: Payer: Self-pay | Admitting: Family

## 2019-09-23 ENCOUNTER — Other Ambulatory Visit: Payer: Self-pay | Admitting: Family

## 2019-09-23 DIAGNOSIS — D500 Iron deficiency anemia secondary to blood loss (chronic): Secondary | ICD-10-CM

## 2019-09-23 DIAGNOSIS — I82411 Acute embolism and thrombosis of right femoral vein: Secondary | ICD-10-CM

## 2019-09-23 NOTE — Telephone Encounter (Signed)
I was able to speak with [name] [name] and go over her recent lab results. She was positive for the lupus anticoagulant and was slightly iron deficient.  We will have her come back in 2 months for follow-up and repeat lab work.  She will also try taking gentle iron over the counter. No questions at this time.

## 2019-10-13 ENCOUNTER — Other Ambulatory Visit: Payer: Self-pay | Admitting: Internal Medicine

## 2019-10-17 NOTE — Telephone Encounter (Signed)
I am no longer this patient's PCP. Please send to Dionisio David, NP. Thanks

## 2019-11-07 ENCOUNTER — Inpatient Hospital Stay: Payer: Self-pay

## 2019-11-07 ENCOUNTER — Inpatient Hospital Stay: Payer: Self-pay | Attending: Family | Admitting: Family

## 2019-11-07 ENCOUNTER — Telehealth: Payer: Self-pay | Admitting: Family Medicine

## 2019-11-07 ENCOUNTER — Other Ambulatory Visit: Payer: Self-pay | Admitting: Cardiovascular Disease

## 2019-11-07 ENCOUNTER — Other Ambulatory Visit: Payer: Self-pay

## 2019-11-07 ENCOUNTER — Other Ambulatory Visit: Payer: Self-pay | Admitting: Family

## 2019-11-07 DIAGNOSIS — I82411 Acute embolism and thrombosis of right femoral vein: Secondary | ICD-10-CM

## 2019-11-07 DIAGNOSIS — D500 Iron deficiency anemia secondary to blood loss (chronic): Secondary | ICD-10-CM

## 2019-11-07 DIAGNOSIS — R760 Raised antibody titer: Secondary | ICD-10-CM

## 2019-11-07 DIAGNOSIS — I82431 Acute embolism and thrombosis of right popliteal vein: Secondary | ICD-10-CM | POA: Insufficient documentation

## 2019-11-07 DIAGNOSIS — I82451 Acute embolism and thrombosis of right peroneal vein: Secondary | ICD-10-CM | POA: Insufficient documentation

## 2019-11-07 DIAGNOSIS — I82441 Acute embolism and thrombosis of right tibial vein: Secondary | ICD-10-CM | POA: Insufficient documentation

## 2019-11-07 DIAGNOSIS — I2699 Other pulmonary embolism without acute cor pulmonale: Secondary | ICD-10-CM

## 2019-11-07 DIAGNOSIS — M329 Systemic lupus erythematosus, unspecified: Secondary | ICD-10-CM | POA: Insufficient documentation

## 2019-11-07 DIAGNOSIS — Z7901 Long term (current) use of anticoagulants: Secondary | ICD-10-CM | POA: Insufficient documentation

## 2019-11-07 DIAGNOSIS — I82461 Acute embolism and thrombosis of right calf muscular vein: Secondary | ICD-10-CM | POA: Insufficient documentation

## 2019-11-07 DIAGNOSIS — I7300 Raynaud's syndrome without gangrene: Secondary | ICD-10-CM | POA: Insufficient documentation

## 2019-11-07 LAB — CBC WITH DIFFERENTIAL (CANCER CENTER ONLY)
Abs Immature Granulocytes: 0.11 10*3/uL — ABNORMAL HIGH (ref 0.00–0.07)
Basophils Absolute: 0 10*3/uL (ref 0.0–0.1)
Basophils Relative: 0 %
Eosinophils Absolute: 0 10*3/uL (ref 0.0–0.5)
Eosinophils Relative: 0 %
HCT: 31 % — ABNORMAL LOW (ref 36.0–46.0)
Hemoglobin: 9.9 g/dL — ABNORMAL LOW (ref 12.0–15.0)
Immature Granulocytes: 4 %
Lymphocytes Relative: 10 %
Lymphs Abs: 0.3 10*3/uL — ABNORMAL LOW (ref 0.7–4.0)
MCH: 26.4 pg (ref 26.0–34.0)
MCHC: 31.9 g/dL (ref 30.0–36.0)
MCV: 82.7 fL (ref 80.0–100.0)
Monocytes Absolute: 0.1 10*3/uL (ref 0.1–1.0)
Monocytes Relative: 5 %
Neutro Abs: 2.1 10*3/uL (ref 1.7–7.7)
Neutrophils Relative %: 81 %
Platelet Count: 170 10*3/uL (ref 150–400)
RBC: 3.75 MIL/uL — ABNORMAL LOW (ref 3.87–5.11)
RDW: 15.6 % — ABNORMAL HIGH (ref 11.5–15.5)
WBC Count: 2.7 10*3/uL — ABNORMAL LOW (ref 4.0–10.5)
nRBC: 0 % (ref 0.0–0.2)

## 2019-11-07 LAB — CMP (CANCER CENTER ONLY)
ALT: 16 U/L (ref 0–44)
AST: 41 U/L (ref 15–41)
Albumin: 3 g/dL — ABNORMAL LOW (ref 3.5–5.0)
Alkaline Phosphatase: 63 U/L (ref 38–126)
Anion gap: 6 (ref 5–15)
BUN: 12 mg/dL (ref 6–20)
CO2: 26 mmol/L (ref 22–32)
Calcium: 8.5 mg/dL — ABNORMAL LOW (ref 8.9–10.3)
Chloride: 104 mmol/L (ref 98–111)
Creatinine: 0.68 mg/dL (ref 0.44–1.00)
GFR, Est AFR Am: >60 mL/min (ref >60)
GFR, Estimated: >60 mL/min (ref >60)
Glucose, Bld: 92 mg/dL (ref 70–99)
Potassium: 3.9 mmol/L (ref 3.5–5.1)
Sodium: 136 mmol/L (ref 135–145)
Total Bilirubin: 0.3 mg/dL (ref 0.3–1.2)
Total Protein: 6.7 g/dL (ref 6.5–8.1)

## 2019-11-07 LAB — IRON AND TIBC
Iron: 44 ug/dL (ref 41–142)
Saturation Ratios: 16 % — ABNORMAL LOW (ref 21–57)
TIBC: 281 ug/dL (ref 236–444)
UIBC: 238 ug/dL (ref 120–384)

## 2019-11-07 LAB — FERRITIN: Ferritin: 64 ng/mL (ref 11–307)

## 2019-11-07 MED ORDER — RIVAROXABAN 10 MG PO TABS: 10.0000 mg | ORAL_TABLET | Freq: Every day | ORAL | 6 refills | Status: DC

## 2019-11-07 NOTE — Telephone Encounter (Signed)
Pt called wanting to switch/no longer take Trazodone 100mg . Pt states its not working.

## 2019-11-07 NOTE — Progress Notes (Addendum)
Hematology and Oncology Follow Up Visit  YVELISSE CLOUTHIER [id number] May 16, 1996 24 y.o. 11/07/2019   Principle Diagnosis:  DVT of the right lower extremity by [place] and VQ scan indeterminate of PE in August/September 2020 Lupus anticoagulant positive Iron deficiency  Current Therapy:   Xarelto 20 mg PO daily - Decreased to 10 mg PO daily today, 11/07/2019 Oral iron supplement daily    Interim History:  Ms. Hasting was here for lab and had to leave to go babysit. Her visit today was over the phone. She was positive for the lupus anticoagulant in December and had a mildly elevated Anticardiolipin antibody IgM level at 25.  She is doing well on Xarelto and has had no issues with abnormal bleeding. No bruising or petechiae.  Her cycle is regular and heavy at times.  No fever, chills, n/v, cough, rash, dizziness, SOB< chest pain, palpitations, abdominal pain or changes in bowel or bladder habits.  She will occasionally have numbness and tingling in her fingers and toes due to Raynaud's.  She has not had any recent flares with Lupus but does notes generalized aches and pains daily. She also has swelling in the arms and ankles that waxes and wanes.  She has not been sleeping well despite taking Trazodone and plans to follow-up with her PCP to discuss other options.  She has a good appetite and is staying well hydrated. Her weight is stable.   ECOG Performance Status: 0 - Asymptomatic  Medications:  Allergies as of 11/07/2019      Reactions   Hydroxychloroquine Rash   Latex Rash      Medication List       Accurate as of November 07, 2019  9:48 AM. If you have any questions, ask your nurse or doctor.        amLODipine 10 MG tablet Commonly known as: NORVASC Take 1 tablet (10 mg total) by mouth daily.   buPROPion 150 MG 24 hr tablet Commonly known as: WELLBUTRIN XL Take 1 tablet (150 mg total) by mouth daily.   cyclobenzaprine 10 MG tablet Commonly known as: FLEXERIL Take 1 tablet (10  mg total) by mouth 2 (two) times daily as needed for muscle spasms.   diphenhydrAMINE 25 MG tablet Commonly known as: BENADRYL Take 25 mg by mouth every 6 (six) hours as needed for allergies.   ferrous sulfate 325 (65 FE) MG tablet Take 1 tablet (325 mg total) by mouth 2 (two) times daily. With 1/2 glass of orange juice   folic acid 1 MG tablet Commonly known as: FOLVITE Take 1 tablet (1 mg total) by mouth at bedtime.   gabapentin 300 MG capsule Commonly known as: NEURONTIN Take 1 capsule (300 mg total) by mouth 3 (three) times daily.   loperamide 2 MG capsule Commonly known as: IMODIUM Take 1 capsule (2 mg total) by mouth as needed for diarrhea or loose stools.   metoprolol tartrate 25 MG tablet Commonly known as: LOPRESSOR Take 1 tablet (25 mg total) by mouth 2 (two) times daily.   MULTIVITAMIN ADULT PO Take 1 tablet by mouth daily.   mupirocin ointment 2 % Commonly known as: BACTROBAN PLACE 1 APPLICATION INTO THE NOSE 2 (TWO) TIMES DAILY.   nystatin 100000 UNIT/ML suspension Commonly known as: MYCOSTATIN Take 5 mLs (500,000 Units total) by mouth 2 (two) times daily as needed.   pantoprazole 20 MG tablet Commonly known as: PROTONIX TAKE 1 TABLET BY MOUTH EVERYDAY AT BEDTIME*NOT COVERED**   traZODone 100 MG tablet Commonly  known as: DESYREL Take 1 tablet (100 mg total) by mouth at bedtime.   Vitamin D (Ergocalciferol) 1.25 MG (50000 UNIT) Caps capsule Commonly known as: DRISDOL Take 1 capsule (50,000 Units total) by mouth 2 (two) times a week.   Xarelto 20 MG Tabs tablet Generic drug: rivaroxaban TAKE 1 TABLET (20 MG TOTAL) BY MOUTH DAILY WITH SUPPER.       Allergies:  Allergies  Allergen Reactions  . Hydroxychloroquine Rash  . Latex Rash    Past Medical History, Surgical history, Social history, and Family History were reviewed and updated.  Review of Systems: All other 10 point review of systems is negative.   Physical Exam:  vitals were not  taken for this visit.   Wt Readings from Last 3 Encounters:  09/12/19 147 lb 6.4 oz (66.9 kg)  08/28/19 149 lb (67.6 kg)  05/28/19 124 lb 12.8 oz (56.6 kg)    Lab Results  Component Value Date   WBC 2.7 (L) 11/07/2019   HGB 9.9 (L) 11/07/2019   HCT 31.0 (L) 11/07/2019   MCV 82.7 11/07/2019   PLT 170 11/07/2019   Lab Results  Component Value Date   FERRITIN 52 09/12/2019   IRON 41 09/12/2019   TIBC 298 09/12/2019   UIBC 256 09/12/2019   IRONPCTSAT 14 (L) 09/12/2019   Lab Results  Component Value Date   RETICCTPCT 1.4 09/12/2019   RBC 3.75 (L) 11/07/2019   No results found for: KPAFRELGTCHN, LAMBDASER, KAPLAMBRATIO No results found for: IGGSERUM, IGA, IGMSERUM No results found for: Odetta Pink, SPEI   Chemistry      Component Value Date/Time   NA 136 11/07/2019 0905   NA 136 04/23/2018 1337   K 3.9 11/07/2019 0905   CL 104 11/07/2019 0905   CO2 26 11/07/2019 0905   BUN 12 11/07/2019 0905   BUN 8 04/23/2018 1337   CREATININE 0.68 11/07/2019 0905   CREATININE 0.57 12/11/2017 1018      Component Value Date/Time   CALCIUM 8.5 (L) 11/07/2019 0905   ALKPHOS 63 11/07/2019 0905   AST 41 11/07/2019 0905   ALT 16 11/07/2019 0905   BILITOT 0.3 11/07/2019 0905       Impression and Plan: Ms. Kimes is a very pleasant 24 yo African American female with history of systemic lupus erythematosus and Raynaud's. She was diagnosed with acute DVT of the right femoral vein, right popliteal vein, right posterior tibial veins, right peroneal veins, right soleal veins, and right gastrocnemius veins in August and VQ scan was indeterminate for PE in September.  She was positive for the lupus anticoagulant and had a mildly elevated anticardiolipin IgM antibody.  She is doing well on Xarelto and has completed one year of full dose. We will now decrease her to 10 mg PO daily maintenance. She verbalized understanding of this change.  We  will see her again in 6 months.  She will contact our office with any questions or concerns. We can certainly see her sooner if needed.   Laverna Peace, NP 2/25/20219:48 AM

## 2019-11-07 NOTE — Telephone Encounter (Signed)
Prescription refill request for Xarelto received.   Last office visit: 08/28/2019,  [place] Weight: 66.9kg Age: 24 y.o. Scr: 0.57, 09/12/2019 CrCl: 159 ml/min   Prescription refill sent.

## 2019-11-08 LAB — CARDIOLIPIN ANTIBODIES, IGG, IGM, IGA
Anticardiolipin IgA: <9 APL U/mL (ref 0–11)
Anticardiolipin IgG: <9 GPL U/mL (ref 0–14)
Anticardiolipin IgM: 32 MPL U/mL — ABNORMAL HIGH (ref 0–12)

## 2019-11-08 NOTE — Telephone Encounter (Signed)
Called, no answer. Left a message she would need to come in to discuss medication changes. Advised that previous provider is no long in our office. Advised she has an appointment scheduled for 12/03/19 but if this is too far out of her to please call our office to schedule sooner. Thanks!

## 2019-11-10 LAB — DRVVT CONFIRM: dRVVT Confirm: 2.6 ratio — ABNORMAL HIGH (ref 0.8–1.2)

## 2019-11-10 LAB — DRVVT MIX: dRVVT Mix: 90.6 s — ABNORMAL HIGH (ref 0.0–40.4)

## 2019-11-10 LAB — PTT-LA MIX: PTT-LA Mix: 49.3 s — ABNORMAL HIGH (ref 0.0–48.9)

## 2019-11-10 LAB — LUPUS ANTICOAGULANT PANEL
DRVVT: 176.4 s — ABNORMAL HIGH (ref 0.0–47.0)
PTT Lupus Anticoagulant: 56.4 s — ABNORMAL HIGH (ref 0.0–51.9)

## 2019-11-10 LAB — HEXAGONAL PHASE PHOSPHOLIPID: Hexagonal Phase Phospholipid: 0 s (ref 0–11)

## 2019-11-23 ENCOUNTER — Emergency Department (HOSPITAL_COMMUNITY)
Admission: EM | Admit: 2019-11-23 | Discharge: 2019-11-24 | Disposition: A | Discharge status: Home / Self Care | Payer: Medicaid Other | Attending: Emergency Medicine | Admitting: Emergency Medicine

## 2019-11-23 ENCOUNTER — Encounter (HOSPITAL_COMMUNITY): Payer: Self-pay | Admitting: Emergency Medicine

## 2019-11-23 ENCOUNTER — Other Ambulatory Visit: Payer: Self-pay

## 2019-11-23 DIAGNOSIS — Z8739 Personal history of other diseases of the musculoskeletal system and connective tissue: Secondary | ICD-10-CM | POA: Diagnosis not present

## 2019-11-23 DIAGNOSIS — Z79899 Other long term (current) drug therapy: Secondary | ICD-10-CM | POA: Insufficient documentation

## 2019-11-23 DIAGNOSIS — I10 Essential (primary) hypertension: Secondary | ICD-10-CM | POA: Insufficient documentation

## 2019-11-23 DIAGNOSIS — M79604 Pain in right leg: Secondary | ICD-10-CM | POA: Diagnosis present

## 2019-11-23 DIAGNOSIS — Z7901 Long term (current) use of anticoagulants: Secondary | ICD-10-CM | POA: Insufficient documentation

## 2019-11-23 DIAGNOSIS — R0789 Other chest pain: Secondary | ICD-10-CM | POA: Insufficient documentation

## 2019-11-23 DIAGNOSIS — R0602 Shortness of breath: Secondary | ICD-10-CM | POA: Diagnosis not present

## 2019-11-23 DIAGNOSIS — Z91040 Latex allergy status: Secondary | ICD-10-CM | POA: Insufficient documentation

## 2019-11-23 NOTE — ED Triage Notes (Signed)
Pt c/o R LE swelling/ pain, pt states she has had DVT in same leg 1 year ago, pt is on Xarelto.

## 2019-11-23 NOTE — ED Notes (Signed)
Orders obtained from [facility] for labs

## 2019-11-24 ENCOUNTER — Emergency Department (HOSPITAL_COMMUNITY)
Admission: EM | Admit: 2019-11-24 | Discharge status: Absent without leave | Payer: Medicaid Other | Source: Home / Self Care

## 2019-11-24 ENCOUNTER — Ambulatory Visit (HOSPITAL_COMMUNITY)
Admission: RE | Admit: 2019-11-24 | Discharge: 2019-11-24 | Disposition: A | Discharge status: Home / Self Care | Payer: Medicaid Other | Source: Ambulatory Visit | Attending: Emergency Medicine | Admitting: Emergency Medicine

## 2019-11-24 ENCOUNTER — Emergency Department (HOSPITAL_COMMUNITY)
Admission: EM | Admit: 2019-11-24 | Discharge: 2019-11-24 | Discharge status: Absent without leave | Payer: Medicaid Other

## 2019-11-24 ENCOUNTER — Other Ambulatory Visit: Payer: Self-pay

## 2019-11-24 ENCOUNTER — Emergency Department (HOSPITAL_COMMUNITY): Payer: Medicaid Other

## 2019-11-24 DIAGNOSIS — M79604 Pain in right leg: Secondary | ICD-10-CM | POA: Insufficient documentation

## 2019-11-24 DIAGNOSIS — Z86718 Personal history of other venous thrombosis and embolism: Secondary | ICD-10-CM | POA: Insufficient documentation

## 2019-11-24 DIAGNOSIS — Z7901 Long term (current) use of anticoagulants: Secondary | ICD-10-CM | POA: Insufficient documentation

## 2019-11-24 DIAGNOSIS — R52 Pain, unspecified: Secondary | ICD-10-CM

## 2019-11-24 LAB — CBC WITH DIFFERENTIAL/PLATELET
Abs Immature Granulocytes: 0.07 10*3/uL (ref 0.00–0.07)
Basophils Absolute: 0 10*3/uL (ref 0.0–0.1)
Basophils Relative: 0 %
Eosinophils Absolute: 0 10*3/uL (ref 0.0–0.5)
Eosinophils Relative: 0 %
HCT: 31.3 % — ABNORMAL LOW (ref 36.0–46.0)
Hemoglobin: 9.9 g/dL — ABNORMAL LOW (ref 12.0–15.0)
Immature Granulocytes: 3 %
Lymphocytes Relative: 22 %
Lymphs Abs: 0.6 10*3/uL — ABNORMAL LOW (ref 0.7–4.0)
MCH: 26.8 pg (ref 26.0–34.0)
MCHC: 31.6 g/dL (ref 30.0–36.0)
MCV: 84.6 fL (ref 80.0–100.0)
Monocytes Absolute: 0.3 10*3/uL (ref 0.1–1.0)
Monocytes Relative: 11 %
Neutro Abs: 1.7 10*3/uL (ref 1.7–7.7)
Neutrophils Relative %: 64 %
Platelets: 146 10*3/uL — ABNORMAL LOW (ref 150–400)
RBC: 3.7 MIL/uL — ABNORMAL LOW (ref 3.87–5.11)
RDW: 16.4 % — ABNORMAL HIGH (ref 11.5–15.5)
WBC: 2.6 10*3/uL — ABNORMAL LOW (ref 4.0–10.5)
nRBC: 0 % (ref 0.0–0.2)

## 2019-11-24 LAB — COMPREHENSIVE METABOLIC PANEL
ALT: 32 U/L (ref 0–44)
AST: 64 U/L — ABNORMAL HIGH (ref 15–41)
Albumin: 2.6 g/dL — ABNORMAL LOW (ref 3.5–5.0)
Alkaline Phosphatase: 64 U/L (ref 38–126)
Anion gap: 8 (ref 5–15)
BUN: 10 mg/dL (ref 6–20)
CO2: 23 mmol/L (ref 22–32)
Calcium: 8.6 mg/dL — ABNORMAL LOW (ref 8.9–10.3)
Chloride: 107 mmol/L (ref 98–111)
Creatinine, Ser: 0.81 mg/dL (ref 0.44–1.00)
GFR calc Af Amer: >60 mL/min (ref >60)
GFR calc non Af Amer: >60 mL/min (ref >60)
Glucose, Bld: 83 mg/dL (ref 70–99)
Potassium: 3.5 mmol/L (ref 3.5–5.1)
Sodium: 138 mmol/L (ref 135–145)
Total Bilirubin: 0.6 mg/dL (ref 0.3–1.2)
Total Protein: 6.9 g/dL (ref 6.5–8.1)

## 2019-11-24 LAB — PROTIME-INR
INR: 1.4 — ABNORMAL HIGH (ref 0.8–1.2)
Prothrombin Time: 16.8 seconds — ABNORMAL HIGH (ref 11.4–15.2)

## 2019-11-24 LAB — TROPONIN I (HIGH SENSITIVITY): Troponin I (High Sensitivity): 13 ng/L (ref <18)

## 2019-11-24 MED ORDER — IOHEXOL 350 MG/ML SOLN
100.0000 mL | Freq: Once | INTRAVENOUS | Status: AC | PRN
  Administered 2019-11-24: 04:00:00 100 mL via INTRAVENOUS

## 2019-11-24 MED ORDER — MORPHINE SULFATE (PF) 4 MG/ML IV SOLN: 4.0000 mg | Freq: Once | INTRAVENOUS | Status: DC

## 2019-11-24 MED ORDER — ONDANSETRON HCL 4 MG/2ML IJ SOLN
4.0000 mg | Freq: Once | INTRAMUSCULAR | Status: AC
  Administered 2019-11-24: 4 mg via INTRAVENOUS
  Filled 2019-11-24: qty 2

## 2019-11-24 MED ORDER — OXYCODONE-ACETAMINOPHEN 5-325 MG PO TABS
2.0000 | ORAL_TABLET | Freq: Once | ORAL | Status: AC
  Administered 2019-11-24: 2 via ORAL
  Filled 2019-11-24: qty 2

## 2019-11-24 MED ORDER — SODIUM CHLORIDE 0.9 % IV BOLUS (SEPSIS)
1000.0000 mL | Freq: Once | INTRAVENOUS | Status: AC
  Administered 2019-11-24: 04:00:00 1000 mL via INTRAVENOUS

## 2019-11-24 NOTE — ED Provider Notes (Signed)
TIME SEEN: 3:13 AM  CHIEF COMPLAINT: Right leg pain, chest pain, shortness of breath  HPI: Patient is a 24 year old female with history of hypertension, lupus, previous PE and DVT who presents to the emergency department with complaints of 6 days of right calf pain.  States most of the pain is in the lateral distal lower extremity.  Denies any injury.  No redness or warmth.  Feels similar to her previous DVTs.  She reports compliance with her Xarelto and states she has never developed a clot while on anticoagulation.  Also complains of some intermittent chest tightness and shortness of breath for the past 3 days.  No fever or cough.  ROS: See HPI Constitutional: no fever  Eyes: no drainage  ENT: no runny nose   Cardiovascular:  chest pain  Resp: SOB  GI: no vomiting GU: no dysuria Integumentary: no rash  Allergy: no hives  Musculoskeletal: Right leg pain Neurological: no slurred speech ROS otherwise negative  PAST MEDICAL HISTORY/PAST SURGICAL HISTORY:  Past Medical History:  Diagnosis Date  . Accidental methotrexate overdose 01/08/2018  . Acute lower UTI   . Bronchitis 03/19/2019  . Cardiomegaly 05/08/2019  . Chronic midline low back pain without sciatica 11/09/2016   Facet joint arthropathy  . Dehydration 03/19/2019  . Depo-Provera contraceptive status 08/11/2015  . Depression 02/08/2017  . Dysphagia 06/01/2018  . Dysuria 04/19/2019  . Elevated AST (SGOT) 03/19/2019  . Essential hypertension 03/19/2019  . Facial edema   . Gout   . Hypertension   . IDA (iron deficiency anemia) 08/11/2015  . Immunosuppressed status ([place])   . Insomnia 01/29/2016  . Leukopenia 01/10/2018  . Lobar pneumonia ([place]) 06/07/2018  . Lupus ([place])   . Lupus (systemic lupus erythematosus) ([place]) 03/19/2019  . Metabolic acidosis [id number]  . Methotrexate toxicity   . Mouth ulcers   . Mucositis oral 01/12/2018  . Odynophagia 06/01/2018  . Oral thrush 03/19/2019  . Pleuritic chest pain 06/05/2018  . Primary osteoarthritis of  both knees 11/09/2016  . Rash due to systemic lupus erythematosus (SLE) ([place])   . Raynaud disease   . Raynaud's disease 08/11/2015  . Relative acute adrenal insufficiency ([place]) 06/11/2018  . Right leg DVT ([place]) 05/08/2019  . Seasonal allergies 08/11/2015  . Sepsis ([place]) 05/08/2019  . Sinus tachycardia 03/19/2019  . SLE (systemic lupus erythematosus) ([place]) 09/19/2016     Positive ANA, double-stranded DNA, Ro, RNP, RF, history of nasal ulcers, fatigue, anemia, arthritis, Raynauds  . Thrush 06/01/2018  . Vaginal candidiasis 01/12/2018  . Vitamin D deficiency 11/09/2016    MEDICATIONS:  Prior to Admission medications   Medication Sig Start Date End Date Taking? Authorizing Provider  amLODipine (NORVASC) 10 MG tablet Take 1 tablet (10 mg total) by mouth daily. 03/26/19   Kayleen Memos, DO  buPROPion (WELLBUTRIN XL) 150 MG 24 hr tablet Take 1 tablet (150 mg total) by mouth daily. 03/26/19   Kayleen Memos, DO  cyclobenzaprine (FLEXERIL) 10 MG tablet Take 1 tablet (10 mg total) by mouth 2 (two) times daily as needed for muscle spasms. 03/25/19   Kayleen Memos, DO  diphenhydrAMINE (BENADRYL) 25 MG tablet Take 25 mg by mouth every 6 (six) hours as needed for allergies.    [provider]  ferrous sulfate 325 (65 FE) MG tablet Take 1 tablet (325 mg total) by mouth 2 (two) times daily. With 1/2 glass of orange juice 12/15/15   Davonna Belling, MD  folic acid (FOLVITE) 1 MG tablet Take 1  tablet (1 mg total) by mouth at bedtime. 03/25/19   Kayleen Memos, DO  gabapentin (NEURONTIN) 300 MG capsule Take 1 capsule (300 mg total) by mouth 3 (three) times daily. 04/10/19   [name], FNP  loperamide (IMODIUM) 2 MG capsule Take 1 capsule (2 mg total) by mouth as needed for diarrhea or loose stools. 03/25/19   Kayleen Memos, DO  metoprolol tartrate (LOPRESSOR) 25 MG tablet Take 1 tablet (25 mg total) by mouth 2 (two) times daily. 05/11/19   Elodia Florence., MD  Multiple Vitamins-Minerals (MULTIVITAMIN  ADULT PO) Take 1 tablet by mouth daily.    [provider]  mupirocin ointment (BACTROBAN) 2 % PLACE 1 APPLICATION INTO THE NOSE 2 (TWO) TIMES DAILY. 06/17/19   Tresa Garter, MD  nystatin (MYCOSTATIN) 100000 UNIT/ML suspension Take 5 mLs (500,000 Units total) by mouth 2 (two) times daily as needed. 07/22/19   Tresa Garter, MD  pantoprazole (PROTONIX) 20 MG tablet TAKE 1 TABLET BY MOUTH EVERYDAY AT BEDTIME*NOT COVERED** 11/06/19 12/06/19  Vevelyn Francois, NP  rivaroxaban (XARELTO) 10 MG TABS tablet Take 1 tablet (10 mg total) by mouth daily with supper. 11/07/19   [place], Holli Humbles, NP  traZODone (DESYREL) 100 MG tablet Take 1 tablet (100 mg total) by mouth at bedtime. 07/22/19   Tresa Garter, MD  Vitamin D, Ergocalciferol, (DRISDOL) 50000 units CAPS capsule Take 1 capsule (50,000 Units total) by mouth 2 (two) times a week. 12/21/17   Bo Merino, MD    ALLERGIES:  Allergies  Allergen Reactions  . Hydroxychloroquine Rash  . Latex Rash    SOCIAL HISTORY:  Social History   Tobacco Use  . Smoking status: Never Smoker  . Smokeless tobacco: Never Used  Substance Use Topics  . Alcohol use: No    FAMILY HISTORY: Family History  Problem Relation Age of Onset  . Aneurysm Mother   . Stroke Sister   . Seizures Sister   . Lung cancer Maternal Aunt   . Heart attack Paternal Uncle   . Colon cancer Neg Hx   . Esophageal cancer Neg Hx   . Pancreatic cancer Neg Hx   . Stomach cancer Neg Hx     EXAM: BP (!) 126/113 (BP Location: Left Arm)   Pulse 67   Temp 98.4 F (36.9 C) (Oral)   Resp (!) 23   Ht 5\' 3"  (1.6 m)   Wt 68 kg   LMP 11/14/2019   SpO2 100%   BMI 26.56 kg/m  CONSTITUTIONAL: Alert and oriented and responds appropriately to questions.  Chronically ill-appearing, in no distress HEAD: Normocephalic EYES: Conjunctivae clear, pupils appear equal, EOM appear intact ENT: normal nose; moist mucous membranes NECK: Supple, normal ROM CARD: RRR;  S1 and S2 appreciated; no murmurs, no clicks, no rubs, no gallops RESP: Normal chest excursion without splinting or tachypnea; breath sounds clear and equal bilaterally; no wheezes, no rhonchi, no rales, no hypoxia or respiratory distress, speaking full sentences ABD/GI: Normal bowel sounds; non-distended; soft, non-tender, no rebound, no guarding, no peritoneal signs, no hepatosplenomegaly BACK:  The back appears normal EXT: Normal ROM in all joints; no deformity noted, no edema; no cyanosis, compartments soft, no redness or warmth, tender to palpation over the right distal calf and lateral aspect of the right lower extremity, 2+ DP pulses bilaterally, no joint effusions, no ecchymosis SKIN: Normal color for age and race; warm; no rash on exposed skin NEURO: Moves all extremities equally PSYCH: The  patient's mood and manner are appropriate.   MEDICAL DECISION MAKING: Patient here with right lower extremity pain, chest pain and shortness of breath.  Unable to obtain venous ultrasound at this time.  She reports compliance with her Xarelto.  Labs appear near her baseline.  She does have history of leukopenia anemia which are stable.  Complains of some intermittent atypical chest pain.  Will obtain troponin.  Given she is also complaining of chest pain and shortness of breath, will obtain a CT scan to rule out PE.  She states her mother will drive her home.  Will give pain medication in the ED.  Low suspicion for ACS, dissection.  No infectious symptoms to suggest pneumonia.  Blood pressure is noted to be intermittently high but then will improve without intervention.  We will continue to monitor.  ED PROGRESS: Troponin negative.  Doubt ACS.  No chest pain currently.  CT of the chest shows no acute abnormality including no acute PE.  Venous Doppler has been ordered and she will return later today to have this done.  Discussed with patient that I feel her symptoms do seem atypical for DVT given she is  compliant with her Xarelto and pain seems to be more on the lateral aspect of the leg.  No sign of cellulitis or superficial thrombophlebitis.  No injury.  She is able to ambulate.  Recommended follow-up with her PCP for further pain management.  She verbalized understanding and is comfortable with this plan.  Also recommended follow-up with her PCP for her intermittently elevated blood pressures.  At this time, I do not feel there is any life-threatening condition present. I have reviewed, interpreted and discussed all results (EKG, imaging, lab, urine as appropriate) and exam findings with patient/family. I have reviewed nursing notes and appropriate previous records.  I feel the patient is safe to be discharged home without further emergent workup and can continue workup as an outpatient as needed. Discussed usual and customary return precautions. Patient/family verbalize understanding and are comfortable with this plan.  Outpatient follow-up has been provided as needed. All questions have been answered.     EKG Interpretation  Date/Time:  Sunday November 24 2019 01:54:44 EDT Ventricular Rate:  96 PR Interval:    QRS Duration: 80 QT Interval:  385 QTC Calculation: 487 R Axis:   50 Text Interpretation: Sinus rhythm Borderline prolonged QT interval No significant change since last tracing other than rate has improved Confirmed by Pryor Curia [phone]) on 11/24/2019 3:12:43 AM          [name] was evaluated in Emergency Department on 11/24/2019 for the symptoms described in the history of present illness. She was evaluated in the context of the global COVID-19 pandemic, which necessitated consideration that the patient might be at risk for infection with the SARS-CoV-2 virus that causes COVID-19. Institutional protocols and algorithms that pertain to the evaluation of patients at risk for COVID-19 are in a state of rapid change based on information released by regulatory bodies including the  CDC and federal and state organizations. These policies and algorithms were followed during the patient's care in the ED.  Patient was seen wearing N95, face shield, gloves.    Vandana Haman, Delice Bison, DO 11/24/19 [phone]

## 2019-11-24 NOTE — Discharge Instructions (Addendum)
Your labs appear to be at their baseline today.  No signs of damage to your heart.  CT scan showed no blood clot in your lung.  Please return later today for an ultrasound of your right leg.  Please follow-up with your primary care physician for further pain management.

## 2019-11-24 NOTE — ED Notes (Signed)
Patient verbalizes understanding of discharge instructions. Opportunity for questioning and answers were provided. Armband removed by staff, pt discharged from ED. Pt. ambulatory and discharged home.  

## 2019-11-24 NOTE — Progress Notes (Signed)
VASCULAR LAB PRELIMINARY  PRELIMINARY  PRELIMINARY  PRELIMINARY  Right lower extremity venous duplex completed.    Preliminary report:  See CV proc for preliminary results.   [name] [name], RVT 11/24/2019, 12:01 PM

## 2019-12-03 ENCOUNTER — Ambulatory Visit (INDEPENDENT_AMBULATORY_CARE_PROVIDER_SITE_OTHER): Payer: Self-pay | Admitting: Family Medicine

## 2019-12-03 ENCOUNTER — Encounter: Payer: Self-pay | Admitting: Family Medicine

## 2019-12-03 ENCOUNTER — Other Ambulatory Visit: Payer: Self-pay

## 2019-12-03 VITALS — BP 138/98 | HR 127 | Temp 99.8°F | Resp 16 | Ht 63.0 in | Wt 145.0 lb

## 2019-12-03 DIAGNOSIS — I10 Essential (primary) hypertension: Secondary | ICD-10-CM

## 2019-12-03 DIAGNOSIS — G4700 Insomnia, unspecified: Secondary | ICD-10-CM

## 2019-12-03 DIAGNOSIS — D638 Anemia in other chronic diseases classified elsewhere: Secondary | ICD-10-CM

## 2019-12-03 DIAGNOSIS — F3289 Other specified depressive episodes: Secondary | ICD-10-CM

## 2019-12-03 DIAGNOSIS — M79604 Pain in right leg: Secondary | ICD-10-CM

## 2019-12-03 DIAGNOSIS — L03115 Cellulitis of right lower limb: Secondary | ICD-10-CM

## 2019-12-03 LAB — POCT URINALYSIS DIPSTICK
Bilirubin, UA: NEGATIVE
Glucose, UA: NEGATIVE
Ketones, UA: NEGATIVE
Nitrite, UA: NEGATIVE
Protein, UA: POSITIVE — AB
Spec Grav, UA: 1.025 (ref 1.010–1.025)
Urobilinogen, UA: 0.2 E.U./dL
pH, UA: 7 (ref 5.0–8.0)

## 2019-12-03 MED ORDER — BUPROPION HCL ER (XL) 300 MG PO TB24: 300.0000 mg | ORAL_TABLET | Freq: Every day | ORAL | 2 refills | Status: DC

## 2019-12-03 MED ORDER — ACETAMINOPHEN-CODEINE #3 300-30 MG PO TABS: 1.0000 | ORAL_TABLET | Freq: Four times a day (QID) | ORAL | 0 refills | Status: DC | PRN

## 2019-12-03 MED ORDER — TRAZODONE HCL 100 MG PO TABS: 100.0000 mg | ORAL_TABLET | Freq: Every day | ORAL | 3 refills | Status: DC

## 2019-12-03 MED ORDER — AMOXICILLIN-POT CLAVULANATE 875-125 MG PO TABS: 1.0000 | ORAL_TABLET | Freq: Two times a day (BID) | ORAL | 0 refills | Status: DC

## 2019-12-03 NOTE — Progress Notes (Signed)
Patient [facility]    Established Patient Office Visit  Subjective:  Patient ID: [name] [name], female    DOB: 04/11/1996  Age: 24 y.o. MRN: [id number]  CC:  Chief Complaint  Patient presents with  . Lupus  . Hypertension    HPI [name] [name], a 24 year old female with a medical history significant for Raynaud's disease, lupus nephritis, PE and right lower extremity DVT diagnosed in September 2020 on Xarelto, depression, hypertension, anemia of chronic disease, and gout presents for follow-up of hypertension. Patient has a long history of lupus.  She is followed by rheumatologist.  She states that she is recently been tapered off of prednisone and noticed that she has had some swelling.  She has also noticed swelling and redness to right lower extremity that has been warm and tender to touch over the past several days.  Patient currently denies fever, chills, heart rotations, dizziness, paresthesias, shortness of breath, nausea, vomiting diarrhea. Hypertension This is a chronic problem. The problem is controlled. Associated symptoms include peripheral edema. Pertinent negatives include no chest pain, orthopnea, palpitations, shortness of breath or sweats. There are no associated agents to hypertension. Risk factors for coronary artery disease include obesity. Compliance problems include diet.   Leg Pain  The incident occurred more than 1 week ago. There was no injury mechanism. The pain is present in the right leg. The quality of the pain is described as aching and burning. The pain is at a severity of 6/10. The pain is moderate. The pain has been constant since onset. The symptoms are aggravated by palpation. She has tried elevation for the symptoms. The treatment provided no relief.     Past Medical History:  Diagnosis Date  . Accidental methotrexate overdose 01/08/2018  . Acute lower UTI   . Bronchitis 03/19/2019  . Cardiomegaly 05/08/2019  .  Chronic midline low back pain without sciatica 11/09/2016   Facet joint arthropathy  . Dehydration 03/19/2019  . Depo-Provera contraceptive status 08/11/2015  . Depression 02/08/2017  . Dysphagia 06/01/2018  . Dysuria 04/19/2019  . Elevated AST (SGOT) 03/19/2019  . Essential hypertension 03/19/2019  . Facial edema   . Gout   . Hypertension   . IDA (iron deficiency anemia) 08/11/2015  . Immunosuppressed status ([place])   . Insomnia 01/29/2016  . Leukopenia 01/10/2018  . Lobar pneumonia ([place]) 06/07/2018  . Lupus ([place])   . Lupus (systemic lupus erythematosus) ([place]) 03/19/2019  . Metabolic acidosis [id number]  . Methotrexate toxicity   . Mouth ulcers   . Mucositis oral 01/12/2018  . Odynophagia 06/01/2018  . Oral thrush 03/19/2019  . Pleuritic chest pain 06/05/2018  . Primary osteoarthritis of both knees 11/09/2016  . Rash due to systemic lupus erythematosus (SLE) ([place])   . Raynaud disease   . Raynaud's disease 08/11/2015  . Relative acute adrenal insufficiency ([place]) 06/11/2018  . Right leg DVT ([place]) 05/08/2019  . Seasonal allergies 08/11/2015  . Sepsis ([place]) 05/08/2019  . Sinus tachycardia 03/19/2019  . SLE (systemic lupus erythematosus) ([place]) 09/19/2016     Positive ANA, double-stranded DNA, Ro, RNP, RF, history of nasal ulcers, fatigue, anemia, arthritis, Raynauds  . Thrush 06/01/2018  . Vaginal candidiasis 01/12/2018  . Vitamin D deficiency 11/09/2016    Past Surgical History:  Procedure Laterality Date  . ORIF SHOULDER FRACTURE Left   . TONSILLECTOMY      Family History  Problem Relation Age of Onset  . Aneurysm Mother   .  Stroke Sister   . Seizures Sister   . Lung cancer Maternal Aunt   . Heart attack Paternal Uncle   . Colon cancer Neg Hx   . Esophageal cancer Neg Hx   . Pancreatic cancer Neg Hx   . Stomach cancer Neg Hx     Social History   Socioeconomic History  . Marital status: Single    Spouse name: Not on file  . Number of children: Not on file  . Years of education: Not on file   . Highest education level: Not on file  Occupational History  . Not on file  Tobacco Use  . Smoking status: Never Smoker  . Smokeless tobacco: Never Used  Substance and Sexual Activity  . Alcohol use: No  . Drug use: No  . Sexual activity: Not Currently  Other Topics Concern  . Not on file  Social History Narrative  . Not on file   Social Determinants of Health   Financial Resource Strain:   . Difficulty of Paying Living Expenses:   Food Insecurity:   . Worried About Charity fundraiser in the Last Year:   . Arboriculturist in the Last Year:   Transportation Needs:   . Film/video editor (Medical):   Marland Kitchen Lack of Transportation (Non-Medical):   Physical Activity:   . Days of Exercise per Week:   . Minutes of Exercise per Session:   Stress:   . Feeling of Stress :   Social Connections:   . Frequency of Communication with Friends and Family:   . Frequency of Social Gatherings with Friends and Family:   . Attends Religious Services:   . Active Member of Clubs or Organizations:   . Attends Archivist Meetings:   Marland Kitchen Marital Status:   Intimate Partner Violence:   . Fear of Current or Ex-Partner:   . Emotionally Abused:   Marland Kitchen Physically Abused:   . Sexually Abused:     Outpatient Medications Prior to Visit  Medication Sig Dispense Refill  . amLODipine (NORVASC) 10 MG tablet Take 1 tablet (10 mg total) by mouth daily. 30 tablet 0  . buPROPion (WELLBUTRIN XL) 150 MG 24 hr tablet Take 1 tablet (150 mg total) by mouth daily. 30 tablet 0  . cyclobenzaprine (FLEXERIL) 10 MG tablet Take 1 tablet (10 mg total) by mouth 2 (two) times daily as needed for muscle spasms. 10 tablet 0  . diphenhydrAMINE (BENADRYL) 25 MG tablet Take 25 mg by mouth every 6 (six) hours as needed for allergies.    . ferrous sulfate 325 (65 FE) MG tablet Take 1 tablet (325 mg total) by mouth 2 (two) times daily. With 1/2 glass of orange juice 30 tablet 0  . folic acid (FOLVITE) 1 MG tablet Take 1  tablet (1 mg total) by mouth at bedtime. 30 tablet 0  . gabapentin (NEURONTIN) 300 MG capsule Take 1 capsule (300 mg total) by mouth 3 (three) times daily. 90 capsule 3  . loperamide (IMODIUM) 2 MG capsule Take 1 capsule (2 mg total) by mouth as needed for diarrhea or loose stools. 10 capsule 0  . metoprolol tartrate (LOPRESSOR) 25 MG tablet Take 1 tablet (25 mg total) by mouth 2 (two) times daily. 30 tablet 0  . Multiple Vitamins-Minerals (MULTIVITAMIN ADULT PO) Take 1 tablet by mouth daily.    . mupirocin ointment (BACTROBAN) 2 % PLACE 1 APPLICATION INTO THE NOSE 2 (TWO) TIMES DAILY. 22 g 0  . pantoprazole (PROTONIX)  20 MG tablet TAKE 1 TABLET BY MOUTH EVERYDAY AT BEDTIME*NOT COVERED** 30 tablet 0  . rivaroxaban (XARELTO) 10 MG TABS tablet Take 1 tablet (10 mg total) by mouth daily with supper. 30 tablet 6  . traZODone (DESYREL) 100 MG tablet Take 1 tablet (100 mg total) by mouth at bedtime. 30 tablet 3  . Vitamin D, Ergocalciferol, (DRISDOL) 50000 units CAPS capsule Take 1 capsule (50,000 Units total) by mouth 2 (two) times a week. 24 capsule 0  . nystatin (MYCOSTATIN) 100000 UNIT/ML suspension Take 5 mLs (500,000 Units total) by mouth 2 (two) times daily as needed. 60 mL 3   No facility-administered medications prior to visit.    Allergies  Allergen Reactions  . Hydroxychloroquine Rash  . Latex Rash    ROS Review of Systems  Constitutional: Negative for activity change and appetite change.  HENT: Negative.   Respiratory: Negative for shortness of breath.   Cardiovascular: Negative for chest pain, palpitations and orthopnea.  Gastrointestinal: Negative.   Genitourinary: Negative.   Musculoskeletal: Positive for arthralgias.  Skin:       Right leg discoloration  Neurological: Negative.   Psychiatric/Behavioral: Negative.       Objective:    Physical Exam  Constitutional: She is oriented to person, place, and time. She appears well-developed and well-nourished.  HENT:   Head: Normocephalic.  Eyes: Pupils are equal, round, and reactive to light.  Cardiovascular: Normal rate.  Pulmonary/Chest: Effort normal.  Abdominal: Soft.  Neurological: She is alert and oriented to person, place, and time.  Skin:  Right lower extremity edema and erythema, posterior calve. Tender to palpation. No drainage   Psychiatric: She has a normal mood and affect. Her behavior is normal. Judgment and thought content normal.     BP (!) 138/98 (BP Location: Left Arm, Patient Position: Sitting, Cuff Size: Normal) Comment: manually  Pulse (!) 127   Temp 99.8 F (37.7 C) (Oral)   Resp 16   Ht 5\' 3"  (1.6 m)   Wt 145 lb (65.8 kg)   LMP 11/14/2019   SpO2 100%   BMI 25.69 kg/m  Wt Readings from Last 3 Encounters:  12/03/19 145 lb (65.8 kg)  11/23/19 149 lb 14.6 oz (68 kg)  09/12/19 147 lb 6.4 oz (66.9 kg)     Health Maintenance Due  Topic Date Due  . INFLUENZA VACCINE  04/13/2019    There are no preventive care reminders to display for this patient.  Lab Results  Component Value Date   TSH 3.309 05/09/2019   Lab Results  Component Value Date   WBC 2.6 (L) 11/23/2019   HGB 9.9 (L) 11/23/2019   HCT 31.3 (L) 11/23/2019   MCV 84.6 11/23/2019   PLT 146 (L) 11/23/2019   Lab Results  Component Value Date   NA 138 11/23/2019   K 3.5 11/23/2019   CO2 23 11/23/2019   GLUCOSE 83 11/23/2019   BUN 10 11/23/2019   CREATININE 0.81 11/23/2019   BILITOT 0.6 11/23/2019   ALKPHOS 64 11/23/2019   AST 64 (H) 11/23/2019   ALT 32 11/23/2019   PROT 6.9 11/23/2019   ALBUMIN 2.6 (L) 11/23/2019   CALCIUM 8.6 (L) 11/23/2019   ANIONGAP 8 11/23/2019   No results found for: CHOL No results found for: HDL No results found for: LDLCALC No results found for: TRIG No results found for: [facility] Lab Results  Component Value Date   HGBA1C 4.5 (L) 03/20/2019      Assessment & Plan:  Problem List Items Addressed This Visit      Cardiovascular and Mediastinum   Essential  hypertension - Primary   Relevant Orders   Urinalysis Dipstick    Other Visit Diagnoses    Cellulitis of right leg       Relevant Medications   amoxicillin-clavulanate (AUGMENTIN) 875-125 MG tablet   Right leg pain       Relevant Medications   acetaminophen-codeine (TYLENOL #3) 300-30 MG tablet   Anemia of chronic disease       Relevant Orders   Anemia panel      Meds ordered this encounter  Medications  . amoxicillin-clavulanate (AUGMENTIN) 875-125 MG tablet    Sig: Take 1 tablet by mouth 2 (two) times daily for 10 days.    Dispense:  20 tablet    Refill:  0    Order Specific Question:   Supervising Provider    Answer:   Tresa Garter [id number]  . acetaminophen-codeine (TYLENOL #3) 300-30 MG tablet    Sig: Take 1 tablet by mouth every 6 (six) hours as needed for moderate pain.    Dispense:  20 tablet    Refill:  0    Order Specific Question:   Supervising Provider    Answer:   Tresa Garter [id number]   Essential hypertension - Continue medication, monitor blood pressure at home. Continue DASH diet. Reminder to go to the ER if any CP, SOB, nausea, dizziness, severe HA, changes vision/speech, left arm numbness and tingling and jaw pain.  - Urinalysis Dipstick - Basic Metabolic Panel  Cellulitis of right leg Possible cellulitis of right lower extremity, erythema, warm and tender to palpation. Empiric antibiotics initiated.  Will review CBC as results become available - CBC  Right leg pain Tylenol #3, every 6 hours as needed for moderate to severe pain  Anemia of chronic disease - Anemia panel  Other depression - buPROPion (WELLBUTRIN XL) 300 MG 24 hr tablet; Take 1 tablet (300 mg total) by mouth daily.  Dispense: 30 tablet; Refill: 2   Insomnia, unspecified type - traZODone (DESYREL) 100 MG tablet; Take 1 tablet (100 mg total) by mouth at bedtime.  Dispense: 30 tablet; Refill: 3    Follow-up: Return in about 2 weeks (around 12/17/2019).    Donia Pounds  APRN, MSN, FNP-C Patient [facility] [address] [place], [place] [address] [phone]

## 2019-12-03 NOTE — Patient Instructions (Addendum)
COVID-19 Vaccine Information can be found at: ShippingScam.co.uk For questions related to vaccine distribution or appointments, please email [email or web address] or call [phone].    We'll get back together to discuss birth control options.    Right leg cellulitis: Augmentin 875-125 every 12 hours with food for a total of 10 days.    Tylenol #3 every 6 hours as needed for severe

## 2019-12-04 LAB — ANEMIA PANEL
Ferritin: 55 ng/mL (ref 15–150)
Folate, Hemolysate: 379 ng/mL
Folate, RBC: 1325 ng/mL (ref >498)
Hematocrit: 28.6 % — ABNORMAL LOW (ref 34.0–46.6)
Iron Saturation: 10 % — ABNORMAL LOW (ref 15–55)
Iron: 28 ug/dL (ref 27–159)
Retic Ct Pct: 1.5 % (ref 0.6–2.6)
Total Iron Binding Capacity: 271 ug/dL (ref 250–450)
UIBC: 243 ug/dL (ref 131–425)
Vitamin B-12: 701 pg/mL (ref 232–1245)

## 2019-12-04 LAB — CBC
Hemoglobin: 9.2 g/dL — ABNORMAL LOW (ref 11.1–15.9)
MCH: 26 pg — ABNORMAL LOW (ref 26.6–33.0)
MCHC: 32.2 g/dL (ref 31.5–35.7)
MCV: 81 fL (ref 79–97)
Platelets: 174 10*3/uL (ref 150–450)
RBC: 3.54 x10E6/uL — ABNORMAL LOW (ref 3.77–5.28)
RDW: 16.2 % — ABNORMAL HIGH (ref 11.7–15.4)
WBC: 3.5 10*3/uL (ref 3.4–10.8)

## 2019-12-04 LAB — BASIC METABOLIC PANEL
BUN/Creatinine Ratio: 22 (ref 9–23)
BUN: 11 mg/dL (ref 6–20)
CO2: 24 mmol/L (ref 20–29)
Calcium: 8.7 mg/dL (ref 8.7–10.2)
Chloride: 103 mmol/L (ref 96–106)
Creatinine, Ser: 0.51 mg/dL — ABNORMAL LOW (ref 0.57–1.00)
GFR calc Af Amer: 155 mL/min/{1.73_m2} (ref >59)
GFR calc non Af Amer: 134 mL/min/{1.73_m2} (ref >59)
Glucose: 83 mg/dL (ref 65–99)
Potassium: 3.9 mmol/L (ref 3.5–5.2)
Sodium: 140 mmol/L (ref 134–144)

## 2019-12-06 ENCOUNTER — Encounter (HOSPITAL_COMMUNITY): Payer: Self-pay | Admitting: Emergency Medicine

## 2019-12-06 ENCOUNTER — Emergency Department (HOSPITAL_COMMUNITY): Payer: Medicaid Other

## 2019-12-06 ENCOUNTER — Inpatient Hospital Stay (HOSPITAL_COMMUNITY)
Admission: EM | Admit: 2019-12-06 | Discharge: 2019-12-09 | DRG: 546 | Disposition: A | Discharge status: Home / Self Care | Payer: Medicaid Other | Attending: Internal Medicine | Admitting: Internal Medicine

## 2019-12-06 ENCOUNTER — Other Ambulatory Visit: Payer: Self-pay

## 2019-12-06 DIAGNOSIS — Z79899 Other long term (current) drug therapy: Secondary | ICD-10-CM

## 2019-12-06 DIAGNOSIS — L03115 Cellulitis of right lower limb: Secondary | ICD-10-CM | POA: Diagnosis present

## 2019-12-06 DIAGNOSIS — F32A Depression, unspecified: Secondary | ICD-10-CM | POA: Diagnosis present

## 2019-12-06 DIAGNOSIS — E2740 Unspecified adrenocortical insufficiency: Secondary | ICD-10-CM | POA: Diagnosis present

## 2019-12-06 DIAGNOSIS — Z86711 Personal history of pulmonary embolism: Secondary | ICD-10-CM

## 2019-12-06 DIAGNOSIS — I82411 Acute embolism and thrombosis of right femoral vein: Secondary | ICD-10-CM | POA: Diagnosis present

## 2019-12-06 DIAGNOSIS — I10 Essential (primary) hypertension: Secondary | ICD-10-CM | POA: Diagnosis present

## 2019-12-06 DIAGNOSIS — Z91040 Latex allergy status: Secondary | ICD-10-CM

## 2019-12-06 DIAGNOSIS — M329 Systemic lupus erythematosus, unspecified: Principal | ICD-10-CM | POA: Diagnosis present

## 2019-12-06 DIAGNOSIS — M170 Bilateral primary osteoarthritis of knee: Secondary | ICD-10-CM | POA: Diagnosis present

## 2019-12-06 DIAGNOSIS — F329 Major depressive disorder, single episode, unspecified: Secondary | ICD-10-CM | POA: Diagnosis present

## 2019-12-06 DIAGNOSIS — I82401 Acute embolism and thrombosis of unspecified deep veins of right lower extremity: Secondary | ICD-10-CM | POA: Diagnosis present

## 2019-12-06 DIAGNOSIS — M3214 Glomerular disease in systemic lupus erythematosus: Secondary | ICD-10-CM | POA: Diagnosis present

## 2019-12-06 DIAGNOSIS — K219 Gastro-esophageal reflux disease without esophagitis: Secondary | ICD-10-CM | POA: Diagnosis present

## 2019-12-06 DIAGNOSIS — Z823 Family history of stroke: Secondary | ICD-10-CM

## 2019-12-06 DIAGNOSIS — E559 Vitamin D deficiency, unspecified: Secondary | ICD-10-CM | POA: Diagnosis present

## 2019-12-06 DIAGNOSIS — Z7901 Long term (current) use of anticoagulants: Secondary | ICD-10-CM

## 2019-12-06 DIAGNOSIS — Z86718 Personal history of other venous thrombosis and embolism: Secondary | ICD-10-CM

## 2019-12-06 DIAGNOSIS — R000 Tachycardia, unspecified: Secondary | ICD-10-CM | POA: Diagnosis present

## 2019-12-06 DIAGNOSIS — Z8249 Family history of ischemic heart disease and other diseases of the circulatory system: Secondary | ICD-10-CM

## 2019-12-06 DIAGNOSIS — Z888 Allergy status to other drugs, medicaments and biological substances status: Secondary | ICD-10-CM

## 2019-12-06 DIAGNOSIS — Z801 Family history of malignant neoplasm of trachea, bronchus and lung: Secondary | ICD-10-CM

## 2019-12-06 DIAGNOSIS — Z20822 Contact with and (suspected) exposure to covid-19: Secondary | ICD-10-CM | POA: Diagnosis present

## 2019-12-06 DIAGNOSIS — I7300 Raynaud's syndrome without gangrene: Secondary | ICD-10-CM | POA: Diagnosis present

## 2019-12-06 LAB — CBC WITH DIFFERENTIAL/PLATELET
Abs Immature Granulocytes: 0.09 10*3/uL — ABNORMAL HIGH (ref 0.00–0.07)
Basophils Absolute: 0 10*3/uL (ref 0.0–0.1)
Basophils Relative: 0 %
Eosinophils Absolute: 0 10*3/uL (ref 0.0–0.5)
Eosinophils Relative: 1 %
HCT: 28.1 % — ABNORMAL LOW (ref 36.0–46.0)
Hemoglobin: 8.8 g/dL — ABNORMAL LOW (ref 12.0–15.0)
Immature Granulocytes: 2 %
Lymphocytes Relative: 8 %
Lymphs Abs: 0.4 10*3/uL — ABNORMAL LOW (ref 0.7–4.0)
MCH: 26.3 pg (ref 26.0–34.0)
MCHC: 31.3 g/dL (ref 30.0–36.0)
MCV: 84.1 fL (ref 80.0–100.0)
Monocytes Absolute: 0.2 10*3/uL (ref 0.1–1.0)
Monocytes Relative: 5 %
Neutro Abs: 3.6 10*3/uL (ref 1.7–7.7)
Neutrophils Relative %: 84 %
Platelets: 142 10*3/uL — ABNORMAL LOW (ref 150–400)
RBC: 3.34 MIL/uL — ABNORMAL LOW (ref 3.87–5.11)
RDW: 16.2 % — ABNORMAL HIGH (ref 11.5–15.5)
WBC: 4.2 10*3/uL (ref 4.0–10.5)
nRBC: 0.5 % — ABNORMAL HIGH (ref 0.0–0.2)

## 2019-12-06 LAB — COMPREHENSIVE METABOLIC PANEL
ALT: 17 U/L (ref 0–44)
AST: 37 U/L (ref 15–41)
Albumin: 2.6 g/dL — ABNORMAL LOW (ref 3.5–5.0)
Alkaline Phosphatase: 74 U/L (ref 38–126)
Anion gap: 11 (ref 5–15)
BUN: 11 mg/dL (ref 6–20)
CO2: 24 mmol/L (ref 22–32)
Calcium: 8.5 mg/dL — ABNORMAL LOW (ref 8.9–10.3)
Chloride: 103 mmol/L (ref 98–111)
Creatinine, Ser: 0.65 mg/dL (ref 0.44–1.00)
GFR calc Af Amer: >60 mL/min (ref >60)
GFR calc non Af Amer: >60 mL/min (ref >60)
Glucose, Bld: 88 mg/dL (ref 70–99)
Potassium: 3.5 mmol/L (ref 3.5–5.1)
Sodium: 138 mmol/L (ref 135–145)
Total Bilirubin: 0.5 mg/dL (ref 0.3–1.2)
Total Protein: 6.9 g/dL (ref 6.5–8.1)

## 2019-12-06 LAB — I-STAT BETA HCG BLOOD, ED (MC, WL, AP ONLY): I-stat hCG, quantitative: <5 m[IU]/mL (ref <5)

## 2019-12-06 MED ORDER — MORPHINE SULFATE (PF) 2 MG/ML IV SOLN
2.0000 mg | INTRAVENOUS | Status: DC | PRN
  Administered 2019-12-07 – 2019-12-08 (×7): 2 mg via INTRAVENOUS
  Filled 2019-12-06 (×7): qty 1

## 2019-12-06 MED ORDER — SODIUM CHLORIDE 0.9 % IV BOLUS
1000.0000 mL | Freq: Once | INTRAVENOUS | Status: AC
  Administered 2019-12-06: 1000 mL via INTRAVENOUS

## 2019-12-06 MED ORDER — METHYLPREDNISOLONE SODIUM SUCC 125 MG IJ SOLR
125.0000 mg | Freq: Once | INTRAMUSCULAR | Status: AC
  Administered 2019-12-06: 125 mg via INTRAVENOUS
  Filled 2019-12-06: qty 2

## 2019-12-06 MED ORDER — MORPHINE SULFATE (PF) 4 MG/ML IV SOLN
6.0000 mg | Freq: Once | INTRAVENOUS | Status: AC
  Administered 2019-12-06: 21:00:00 6 mg via INTRAVENOUS
  Filled 2019-12-06: qty 2

## 2019-12-06 MED ORDER — SODIUM CHLORIDE 0.9 % IV SOLN: INTRAVENOUS | Status: DC

## 2019-12-06 NOTE — ED Triage Notes (Addendum)
Pt comes to ed via ems, c/o  "Both her legs hurt" Pt is a 24 year old african [place]  Female, with complains that her legs and hands are swollen/ as well right toe pain, warm to touch and burning and itching for the past two weeks. Pt has a auto immune disorder of lupus and past medical hx of gout, and has been weaned off her steroid. Tonight her pain increased.V/s on arrival 114/64, hr 112, rr22, spo2 96 room air, temp 99.5.alert x 4 and pt states hurts to wtb and utilizes her wheel chair with family assistance.

## 2019-12-06 NOTE — ED Provider Notes (Addendum)
[facility] DEPT Provider Note   CSN: [id number] Arrival date & time: 12/06/19  1929     History Chief Complaint  Patient presents with  . Leg Pain    [name] [name] is a 24 y.o. female.  24 year old female with history of lupus presents with swelling to legs as well as arms.  Patient had been on prednisone the past but none currently.  Denies any fever or chills.  Does take Xarelto and had a recent negative Doppler of her legs at [facility] about a week ago.  States the pain is been persistent and unrelieved with her home medications.  No vomiting or diarrhea.        Past Medical History:  Diagnosis Date  . Accidental methotrexate overdose 01/08/2018  . Acute lower UTI   . Bronchitis 03/19/2019  . Cardiomegaly 05/08/2019  . Chronic midline low back pain without sciatica 11/09/2016   Facet joint arthropathy  . Dehydration 03/19/2019  . Depo-Provera contraceptive status 08/11/2015  . Depression 02/08/2017  . Dysphagia 06/01/2018  . Dysuria 04/19/2019  . Elevated AST (SGOT) 03/19/2019  . Essential hypertension 03/19/2019  . Facial edema   . Gout   . Hypertension   . IDA (iron deficiency anemia) 08/11/2015  . Immunosuppressed status ([place])   . Insomnia 01/29/2016  . Leukopenia 01/10/2018  . Lobar pneumonia ([place]) 06/07/2018  . Lupus ([place])   . Lupus (systemic lupus erythematosus) ([place]) 03/19/2019  . Metabolic acidosis [id number]  . Methotrexate toxicity   . Mouth ulcers   . Mucositis oral 01/12/2018  . Odynophagia 06/01/2018  . Oral thrush 03/19/2019  . Pleuritic chest pain 06/05/2018  . Primary osteoarthritis of both knees 11/09/2016  . Rash due to systemic lupus erythematosus (SLE) ([place])   . Raynaud disease   . Raynaud's disease 08/11/2015  . Relative acute adrenal insufficiency ([place]) 06/11/2018  . Right leg DVT ([place]) 05/08/2019  . Seasonal allergies 08/11/2015  . Sepsis ([place]) 05/08/2019  . Sinus tachycardia 03/19/2019  . SLE (systemic lupus erythematosus)  ([place]) 09/19/2016     Positive ANA, double-stranded DNA, Ro, RNP, RF, history of nasal ulcers, fatigue, anemia, arthritis, Raynauds  . Thrush 06/01/2018  . Vaginal candidiasis 01/12/2018  . Vitamin D deficiency 11/09/2016    Patient Active Problem List   Diagnosis Date Noted  . Pulmonary embolus ([place]) 05/28/2019  . Sepsis ([place]) 05/08/2019  . Right leg DVT ([place]) 05/08/2019  . Cardiomegaly 05/08/2019  . Dysuria 04/19/2019  . Facial edema   . Rash due to systemic lupus erythematosus (SLE) ([place])   . Immunosuppressed status ([place])   . Acute lower UTI   . 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 Lupus (systemic lupus erythematosus) ([place]) 03/19/2019  . Bronchitis 03/19/2019  . Essential hypertension 03/19/2019  . Elevated AST (SGOT) 03/19/2019  . Dehydration 03/19/2019  . Sinus tachycardia 03/19/2019  . Metabolic acidosis 0000000  . Oral thrush 03/19/2019  . Relative acute adrenal insufficiency ([place]) 06/11/2018  . Anemia 06/09/2018  . Lobar pneumonia ([place]) 06/07/2018  . Pleuritic chest pain 06/05/2018  . Dysphagia 06/01/2018  . Odynophagia 06/01/2018  . Thrush 06/01/2018  . Mucositis oral 01/12/2018  . Vaginal candidiasis 01/12/2018  . Methotrexate toxicity   . Mouth ulcers   . Leukopenia 01/10/2018  . Accidental methotrexate overdose 01/08/2018  . Depression 02/08/2017  . Primary osteoarthritis of both knees 11/09/2016  . Chronic midline low back pain without sciatica 11/09/2016  . Vitamin D deficiency  11/09/2016  . SLE (systemic lupus erythematosus) ([place]) 09/19/2016  . Insomnia 01/29/2016  . Depo-Provera contraceptive status 08/11/2015  . Raynaud's disease 08/11/2015  . Seasonal allergies 08/11/2015  . IDA (iron deficiency anemia) 08/11/2015    Past Surgical History:  Procedure Laterality Date  . ORIF SHOULDER FRACTURE Left   . TONSILLECTOMY       OB History   No obstetric history on file.     Family History    Problem Relation Age of Onset  . Aneurysm Mother   . Stroke Sister   . Seizures Sister   . Lung cancer Maternal Aunt   . Heart attack Paternal Uncle   . Colon cancer Neg Hx   . Esophageal cancer Neg Hx   . Pancreatic cancer Neg Hx   . Stomach cancer Neg Hx     Social History   Tobacco Use  . Smoking status: Never Smoker  . Smokeless tobacco: Never Used  Substance Use Topics  . Alcohol use: No  . Drug use: No    Home Medications Prior to Admission medications   Medication Sig Start Date End Date Taking? Authorizing Provider  acetaminophen-codeine (TYLENOL #3) 300-30 MG tablet Take 1 tablet by mouth every 6 (six) hours as needed for moderate pain. 12/03/19  Yes Dorena Dew, FNP  amLODipine (NORVASC) 10 MG tablet Take 1 tablet (10 mg total) by mouth daily. 03/26/19  Yes Irene Pap N, DO  amoxicillin-clavulanate (AUGMENTIN) 875-125 MG tablet Take 1 tablet by mouth 2 (two) times daily for 10 days. 12/03/19 12/13/19 Yes Dorena Dew, FNP  buPROPion (WELLBUTRIN XL) 300 MG 24 hr tablet Take 1 tablet (300 mg total) by mouth daily. 12/03/19  Yes Dorena Dew, FNP  diphenhydrAMINE (BENADRYL) 25 MG tablet Take 25 mg by mouth every 6 (six) hours as needed for allergies.   Yes [provider]  ferrous sulfate 325 (65 FE) MG tablet Take 1 tablet (325 mg total) by mouth 2 (two) times daily. With 1/2 glass of orange juice 12/15/15  Yes Davonna Belling, MD  folic acid (FOLVITE) 1 MG tablet Take 1 tablet (1 mg total) by mouth at bedtime. Patient taking differently: Take 1 mg by mouth daily.  03/25/19  Yes Irene Pap N, DO  gabapentin (NEURONTIN) 300 MG capsule Take 1 capsule (300 mg total) by mouth 3 (three) times daily. 04/10/19  Yes [name], FNP  lisinopril (ZESTRIL) 20 MG tablet Take 20 mg by mouth daily. 06/18/19  Yes [provider]  loperamide (IMODIUM) 2 MG capsule Take 1 capsule (2 mg total) by mouth as needed for diarrhea or loose stools. 03/25/19  Yes  Irene Pap N, DO  metoprolol tartrate (LOPRESSOR) 25 MG tablet Take 1 tablet (25 mg total) by mouth 2 (two) times daily. 05/11/19  Yes Elodia Florence., MD  Multiple Vitamins-Minerals (MULTIVITAMIN ADULT PO) Take 1 tablet by mouth daily.   Yes [provider]  mycophenolate (CELLCEPT) 250 MG capsule Take 500 mg by mouth 2 (two) times daily. 11/27/19  Yes [provider]  pantoprazole (PROTONIX) 20 MG tablet TAKE 1 TABLET BY MOUTH EVERYDAY AT BEDTIME*NOT COVERED** Patient taking differently: Take 20 mg by mouth at bedtime.  11/06/19 12/06/19 Yes Vevelyn Francois, NP  rivaroxaban (XARELTO) 10 MG TABS tablet Take 1 tablet (10 mg total) by mouth daily with supper. Patient taking differently: Take 10 mg by mouth daily.  11/07/19  Yes [place], Holli Humbles, NP  tacrolimus (PROGRAF) 1 MG capsule Take  1 mg by mouth 2 (two) times daily. 07/25/19  Yes [provider]  traZODone (DESYREL) 100 MG tablet Take 1 tablet (100 mg total) by mouth at bedtime. 12/03/19  Yes Dorena Dew, FNP  Vitamin D, Ergocalciferol, (DRISDOL) 50000 units CAPS capsule Take 1 capsule (50,000 Units total) by mouth 2 (two) times a week. 12/21/17  Yes Deveshwar, Abel Presto, MD  cyclobenzaprine (FLEXERIL) 10 MG tablet Take 1 tablet (10 mg total) by mouth 2 (two) times daily as needed for muscle spasms. Patient not taking: Reported on 12/06/2019 03/25/19   Kayleen Memos, DO  mupirocin ointment (BACTROBAN) 2 % PLACE 1 APPLICATION INTO THE NOSE 2 (TWO) TIMES DAILY. Patient not taking: Reported on 12/06/2019 06/17/19   Tresa Garter, MD    Allergies    Hydroxychloroquine and Latex  Review of Systems   Review of Systems  All other systems reviewed and are negative.   Physical Exam Updated Vital Signs BP 120/72 (BP Location: Left Arm)   Pulse (!) 124   Temp 99.2 F (37.3 C) (Oral)   Ht 1.6 m (5\' 3" )   Wt 66.7 kg   LMP 11/14/2019   SpO2 96%   BMI 26.04 kg/m   Physical Exam Vitals and nursing  note reviewed.  Constitutional:      General: She is not in acute distress.    Appearance: Normal appearance. She is well-developed. She is not toxic-appearing.  HENT:     Head: Normocephalic and atraumatic.  Eyes:     General: Lids are normal.     Conjunctiva/sclera: Conjunctivae normal.     Pupils: Pupils are equal, round, and reactive to light.  Neck:     Thyroid: No thyroid mass.     Trachea: No tracheal deviation.  Cardiovascular:     Rate and Rhythm: Regular rhythm. Tachycardia present.     Heart sounds: Normal heart sounds. No murmur. No gallop.   Pulmonary:     Effort: Pulmonary effort is normal. No respiratory distress.     Breath sounds: Normal breath sounds. No stridor. No decreased breath sounds, wheezing, rhonchi or rales.  Abdominal:     General: Bowel sounds are normal. There is no distension.     Palpations: Abdomen is soft.     Tenderness: There is no abdominal tenderness. There is no rebound.  Musculoskeletal:        General: No tenderness. Normal range of motion.     Cervical back: Normal range of motion and neck supple.     Comments: Joints have limited range of motion due to edema noted to her arms and legs.  Joints are without erythema and are not warm to the touch  Lymphadenopathy:     Comments: 2+ pitting edema to bilateral lower extremities  Skin:    General: Skin is warm and dry.     Findings: No abrasion or rash.  Neurological:     Mental Status: She is alert and oriented to person, place, and time.     GCS: GCS eye subscore is 4. GCS verbal subscore is 5. GCS motor subscore is 6.     Cranial Nerves: No cranial nerve deficit.     Sensory: No sensory deficit.  Psychiatric:        Attention and Perception: Attention normal.        Mood and Affect: Affect is flat.        Speech: Speech normal.        Behavior: Behavior normal.  ED Results / Procedures / Treatments   Labs (all labs ordered are listed, but only abnormal results are  displayed) Labs Reviewed  CBC WITH DIFFERENTIAL/PLATELET  COMPREHENSIVE METABOLIC PANEL  URINALYSIS, ROUTINE W REFLEX MICROSCOPIC  I-STAT BETA HCG BLOOD, ED ([facility], [facility], [facility] ONLY)    EKG EKG Interpretation  Date/Time:  Friday December 06 2019 20:26:50 EDT Ventricular Rate:  126 PR Interval:    QRS Duration: 77 QT Interval:  341 QTC Calculation: 494 R Axis:   38 Text Interpretation: Sinus tachycardia Borderline prolonged QT interval 12 Lead; [place] Confirmed by Lacretia Leigh ([id number]) on 12/06/2019 10:09:08 PM   Radiology No results found.  Procedures Procedures (including critical care time)  Medications Ordered in ED Medications  0.9 %  sodium chloride infusion (has no administration in time range)  morphine 4 MG/ML injection 6 mg (has no administration in time range)  methylPREDNISolone sodium succinate (SOLU-MEDROL) 125 mg/2 mL injection 125 mg (has no administration in time range)  sodium chloride 0.9 % bolus 1,000 mL (has no administration in time range)    ED Course  I have reviewed the triage vital signs and the nursing notes.  Pertinent labs & imaging results that were available during my care of the patient were reviewed by me and considered in my medical decision making (see chart for details).    MDM Rules/Calculators/A&P                      Patient medicated for pain here and given IV fluids.  She also was given dose of steroids.  Suspect patient have a lupus flare.  Will admit to the hospital Final Clinical Impression(s) / ED Diagnoses Final diagnoses:  None    Rx / DC Orders ED Discharge Orders    None       Lacretia Leigh, MD 12/06/19 2201    Lacretia Leigh, MD 12/06/19 2209

## 2019-12-06 NOTE — ED Notes (Signed)
On purwick waiting urine

## 2019-12-07 ENCOUNTER — Encounter (HOSPITAL_COMMUNITY): Payer: Self-pay | Admitting: Internal Medicine

## 2019-12-07 ENCOUNTER — Observation Stay (HOSPITAL_COMMUNITY): Payer: Medicaid Other

## 2019-12-07 DIAGNOSIS — M329 Systemic lupus erythematosus, unspecified: Principal | ICD-10-CM

## 2019-12-07 LAB — D-DIMER, QUANTITATIVE: D-Dimer, Quant: 15.43 ug/mL-FEU — ABNORMAL HIGH (ref 0.00–0.50)

## 2019-12-07 LAB — URINALYSIS, ROUTINE W REFLEX MICROSCOPIC
Bilirubin Urine: NEGATIVE
Glucose, UA: NEGATIVE mg/dL
Ketones, ur: 5 mg/dL — AB
Leukocytes,Ua: NEGATIVE
Nitrite: NEGATIVE
Protein, ur: >=300 mg/dL — AB
Specific Gravity, Urine: 1.016 (ref 1.005–1.030)
pH: 6 (ref 5.0–8.0)

## 2019-12-07 LAB — SARS CORONAVIRUS 2 (TAT 6-24 HRS): SARS Coronavirus 2: NEGATIVE

## 2019-12-07 LAB — HIV ANTIBODY (ROUTINE TESTING W REFLEX): HIV Screen 4th Generation wRfx: NONREACTIVE

## 2019-12-07 LAB — TSH: TSH: 1.18 u[IU]/mL (ref 0.350–4.500)

## 2019-12-07 MED ORDER — FOLIC ACID 1 MG PO TABS
1.0000 mg | ORAL_TABLET | Freq: Every day | ORAL | Status: DC
  Administered 2019-12-07 – 2019-12-09 (×3): 1 mg via ORAL
  Filled 2019-12-07 (×3): qty 1

## 2019-12-07 MED ORDER — LISINOPRIL 20 MG PO TABS
20.0000 mg | ORAL_TABLET | Freq: Every day | ORAL | Status: DC
  Administered 2019-12-07 – 2019-12-09 (×3): 20 mg via ORAL
  Filled 2019-12-07 (×3): qty 1

## 2019-12-07 MED ORDER — BUPROPION HCL ER (XL) 150 MG PO TB24
300.0000 mg | ORAL_TABLET | Freq: Every day | ORAL | Status: DC
  Administered 2019-12-07 – 2019-12-09 (×3): 300 mg via ORAL
  Filled 2019-12-07 (×3): qty 2

## 2019-12-07 MED ORDER — SODIUM CHLORIDE (PF) 0.9 % IJ SOLN
INTRAMUSCULAR | Status: AC
  Filled 2019-12-07: qty 50

## 2019-12-07 MED ORDER — IOHEXOL 350 MG/ML SOLN
100.0000 mL | Freq: Once | INTRAVENOUS | Status: AC | PRN
  Administered 2019-12-07: 100 mL via INTRAVENOUS

## 2019-12-07 MED ORDER — PANTOPRAZOLE SODIUM 20 MG PO TBEC
20.0000 mg | DELAYED_RELEASE_TABLET | Freq: Every day | ORAL | Status: DC
  Administered 2019-12-07 – 2019-12-08 (×3): 20 mg via ORAL
  Filled 2019-12-07 (×3): qty 1

## 2019-12-07 MED ORDER — METOPROLOL TARTRATE 25 MG PO TABS
25.0000 mg | ORAL_TABLET | Freq: Two times a day (BID) | ORAL | Status: DC
  Administered 2019-12-07 – 2019-12-09 (×5): 25 mg via ORAL
  Filled 2019-12-07 (×5): qty 1

## 2019-12-07 MED ORDER — LIP MEDEX EX OINT
TOPICAL_OINTMENT | CUTANEOUS | Status: AC
  Filled 2019-12-07: qty 7

## 2019-12-07 MED ORDER — PREDNISONE 20 MG PO TABS
40.0000 mg | ORAL_TABLET | Freq: Every day | ORAL | Status: DC
  Administered 2019-12-07 – 2019-12-08 (×2): 40 mg via ORAL
  Filled 2019-12-07 (×2): qty 2

## 2019-12-07 MED ORDER — ACETAMINOPHEN 325 MG PO TABS
650.0000 mg | ORAL_TABLET | Freq: Four times a day (QID) | ORAL | Status: DC | PRN
  Administered 2019-12-09: 11:00:00 650 mg via ORAL
  Filled 2019-12-07: qty 2

## 2019-12-07 MED ORDER — TRAZODONE HCL 100 MG PO TABS
100.0000 mg | ORAL_TABLET | Freq: Every day | ORAL | Status: DC
  Administered 2019-12-07 – 2019-12-08 (×3): 100 mg via ORAL
  Filled 2019-12-07 (×3): qty 1

## 2019-12-07 MED ORDER — GABAPENTIN 300 MG PO CAPS
300.0000 mg | ORAL_CAPSULE | Freq: Three times a day (TID) | ORAL | Status: DC
  Administered 2019-12-07 – 2019-12-09 (×7): 300 mg via ORAL
  Filled 2019-12-07 (×7): qty 1

## 2019-12-07 MED ORDER — ACETAMINOPHEN 650 MG RE SUPP: 650.0000 mg | Freq: Four times a day (QID) | RECTAL | Status: DC | PRN

## 2019-12-07 MED ORDER — RIVAROXABAN 10 MG PO TABS
10.0000 mg | ORAL_TABLET | Freq: Every day | ORAL | Status: DC
  Administered 2019-12-07 – 2019-12-09 (×3): 10 mg via ORAL
  Filled 2019-12-07 (×3): qty 1

## 2019-12-07 MED ORDER — AMLODIPINE BESYLATE 5 MG PO TABS
10.0000 mg | ORAL_TABLET | Freq: Every day | ORAL | Status: DC
  Administered 2019-12-07 – 2019-12-09 (×3): 10 mg via ORAL
  Filled 2019-12-07 (×3): qty 2

## 2019-12-07 MED ORDER — FERROUS SULFATE 325 (65 FE) MG PO TABS
325.0000 mg | ORAL_TABLET | Freq: Two times a day (BID) | ORAL | Status: DC
  Administered 2019-12-07 – 2019-12-09 (×6): 325 mg via ORAL
  Filled 2019-12-07 (×6): qty 1

## 2019-12-07 NOTE — H&P (Addendum)
History and Physical    [name] [name] [id number] DOB: June 03, 1996 DOA: 12/06/2019  PCP: Default, Provider, MD Patient coming from: Home  Chief Complaint: Bilateral leg pain  HPI: [name] [name] is a 23 y.o. female with medical history significant of lupus/lupus nephritis, Raynaud's disease, PE and right lower extremity DVT diagnosed in September 2020 started on anticoagulation with Xarelto, depression, hypertension, gout presenting with complaints of bilateral leg pain and swelling. Patient states she was previously on prednisone but it was tapered off. For the past 1 week she has had pain in both of her legs along with swelling. Both of her hands are swollen. She is having shortness of breath. States she was recently started on an antibiotic by her primary care doctor for cellulitis of her right leg. No fevers, chest pain, nausea, vomiting, abdominal pain, diarrhea. No other complaints.  ED Course: Afebrile. Persistently tachycardic. Not tachypneic or hypoxic. Not hypotensive. Labs showing no leukocytosis. Hemoglobin 8.8, not significantly changed compared to labs done a few days ago. Platelet count 142. Creatinine 0.6, at baseline. Beta-hCG negative. UA pending. Chest x-ray showing no active disease. Patient received Solu-Medrol 125 mg and 1 L fluid bolus.  Review of Systems:  All systems reviewed and apart from history of presenting illness, are negative.  Past Medical History:  Diagnosis Date  . Accidental methotrexate overdose 01/08/2018  . Acute lower UTI   . Bronchitis 03/19/2019  . Cardiomegaly 05/08/2019  . Chronic midline low back pain without sciatica 11/09/2016   Facet joint arthropathy  . Dehydration 03/19/2019  . Depo-Provera contraceptive status 08/11/2015  . Depression 02/08/2017  . Dysphagia 06/01/2018  . Dysuria 04/19/2019  . Elevated AST (SGOT) 03/19/2019  . Essential hypertension 03/19/2019  . Facial edema   . Gout   . Hypertension   . IDA (iron deficiency anemia) 08/11/2015    . Immunosuppressed status ([place])   . Insomnia 01/29/2016  . Leukopenia 01/10/2018  . Lobar pneumonia ([place]) 06/07/2018  . Lupus ([place])   . Lupus (systemic lupus erythematosus) ([place]) 03/19/2019  . Metabolic acidosis [id number]  . Methotrexate toxicity   . Mouth ulcers   . Mucositis oral 01/12/2018  . Odynophagia 06/01/2018  . Oral thrush 03/19/2019  . Pleuritic chest pain 06/05/2018  . Primary osteoarthritis of both knees 11/09/2016  . Rash due to systemic lupus erythematosus (SLE) ([place])   . Raynaud disease   . Raynaud's disease 08/11/2015  . Relative acute adrenal insufficiency ([place]) 06/11/2018  . Right leg DVT ([place]) 05/08/2019  . Seasonal allergies 08/11/2015  . Sepsis ([place]) 05/08/2019  . Sinus tachycardia 03/19/2019  . SLE (systemic lupus erythematosus) ([place]) 09/19/2016     Positive ANA, double-stranded DNA, Ro, RNP, RF, history of nasal ulcers, fatigue, anemia, arthritis, Raynauds  . Thrush 06/01/2018  . Vaginal candidiasis 01/12/2018  . Vitamin D deficiency 11/09/2016    Past Surgical History:  Procedure Laterality Date  . ORIF SHOULDER FRACTURE Left   . TONSILLECTOMY       reports that she has never smoked. She has never used smokeless tobacco. She reports that she does not drink alcohol or use drugs.  Allergies  Allergen Reactions  . Hydroxychloroquine Rash  . Latex Rash    Family History  Problem Relation Age of Onset  . Aneurysm Mother   . Stroke Sister   . Seizures Sister   . Lung cancer Maternal Aunt   . Heart attack Paternal Uncle   . Colon cancer Neg Hx   . Esophageal cancer Neg  Hx   . Pancreatic cancer Neg Hx   . Stomach cancer Neg Hx     Prior to Admission medications   Medication Sig Start Date End Date Taking? Authorizing Provider  acetaminophen-codeine (TYLENOL #3) 300-30 MG tablet Take 1 tablet by mouth every 6 (six) hours as needed for moderate pain. 12/03/19  Yes Dorena Dew, FNP  amLODipine (NORVASC) 10 MG tablet Take 1 tablet (10 mg total) by mouth daily.  03/26/19  Yes Irene Pap N, DO  amoxicillin-clavulanate (AUGMENTIN) 875-125 MG tablet Take 1 tablet by mouth 2 (two) times daily for 10 days. 12/03/19 12/13/19 Yes Dorena Dew, FNP  buPROPion (WELLBUTRIN XL) 300 MG 24 hr tablet Take 1 tablet (300 mg total) by mouth daily. 12/03/19  Yes Dorena Dew, FNP  diphenhydrAMINE (BENADRYL) 25 MG tablet Take 25 mg by mouth every 6 (six) hours as needed for allergies.   Yes [provider]  ferrous sulfate 325 (65 FE) MG tablet Take 1 tablet (325 mg total) by mouth 2 (two) times daily. With 1/2 glass of orange juice 12/15/15  Yes Davonna Belling, MD  folic acid (FOLVITE) 1 MG tablet Take 1 tablet (1 mg total) by mouth at bedtime. Patient taking differently: Take 1 mg by mouth daily.  03/25/19  Yes Irene Pap N, DO  gabapentin (NEURONTIN) 300 MG capsule Take 1 capsule (300 mg total) by mouth 3 (three) times daily. 04/10/19  Yes [name], FNP  lisinopril (ZESTRIL) 20 MG tablet Take 20 mg by mouth daily. 06/18/19  Yes [provider]  loperamide (IMODIUM) 2 MG capsule Take 1 capsule (2 mg total) by mouth as needed for diarrhea or loose stools. 03/25/19  Yes Irene Pap N, DO  metoprolol tartrate (LOPRESSOR) 25 MG tablet Take 1 tablet (25 mg total) by mouth 2 (two) times daily. 05/11/19  Yes Elodia Florence., MD  Multiple Vitamins-Minerals (MULTIVITAMIN ADULT PO) Take 1 tablet by mouth daily.   Yes [provider]  mycophenolate (CELLCEPT) 250 MG capsule Take 500 mg by mouth 2 (two) times daily. 11/27/19  Yes [provider]  pantoprazole (PROTONIX) 20 MG tablet TAKE 1 TABLET BY MOUTH EVERYDAY AT BEDTIME*NOT COVERED** Patient taking differently: Take 20 mg by mouth at bedtime.  11/06/19 12/06/19 Yes Vevelyn Francois, NP  rivaroxaban (XARELTO) 10 MG TABS tablet Take 1 tablet (10 mg total) by mouth daily with supper. Patient taking differently: Take 10 mg by mouth daily.  11/07/19  Yes [place], Holli Humbles, NP    tacrolimus (PROGRAF) 1 MG capsule Take 1 mg by mouth 2 (two) times daily. 07/25/19  Yes [provider]  traZODone (DESYREL) 100 MG tablet Take 1 tablet (100 mg total) by mouth at bedtime. 12/03/19  Yes Dorena Dew, FNP  Vitamin D, Ergocalciferol, (DRISDOL) 50000 units CAPS capsule Take 1 capsule (50,000 Units total) by mouth 2 (two) times a week. 12/21/17  Yes Deveshwar, Abel Presto, MD  cyclobenzaprine (FLEXERIL) 10 MG tablet Take 1 tablet (10 mg total) by mouth 2 (two) times daily as needed for muscle spasms. Patient not taking: Reported on 12/06/2019 03/25/19   Kayleen Memos, DO  mupirocin ointment (BACTROBAN) 2 % PLACE 1 APPLICATION INTO THE NOSE 2 (TWO) TIMES DAILY. Patient not taking: Reported on 12/06/2019 06/17/19   Tresa Garter, MD    Physical Exam: Vitals:   12/07/19 0015 12/07/19 0030 12/07/19 0045 12/07/19 0115  BP: 105/60 108/63 108/61 112/78  Pulse: (!) 111 (!) 105 (!) 102 99  Resp: 13 15 15 17   Temp:      TempSrc:      SpO2: 96% 97% 98% 97%  Weight:      Height:        Physical Exam  Constitutional: She is oriented to person, place, and time. She appears well-developed and well-nourished. No distress.  HENT:  Head: Normocephalic.  Dry mucous membranes  Eyes: Right eye exhibits no discharge. Left eye exhibits no discharge.  Cardiovascular: Normal rate, regular rhythm and intact distal pulses.  Pulmonary/Chest: Effort normal and breath sounds normal. No respiratory distress. She has no wheezes. She has no rales.  Abdominal: Soft. Bowel sounds are normal. She exhibits no distension. There is no abdominal tenderness. There is no guarding.  Musculoskeletal:        General: Edema present.     Cervical back: Neck supple.     Comments: Hands and bilateral lower extremities appear mildly edematous  Neurological: She is alert and oriented to person, place, and time.  Skin: Skin is warm and dry. She is not diaphoretic.  Small wound/ scab noted on the lateral  aspect of the right lower leg       Labs on Admission: I have personally reviewed following labs and imaging studies  CBC: Recent Labs  Lab 12/03/19 1449 12/06/19 2014  WBC 3.5 4.2  NEUTROABS  --  3.6  HGB 9.2* 8.8*  HCT 28.6* 28.1*  MCV 81 84.1  PLT 174 [id number]*   Basic Metabolic Panel: Recent Labs  Lab 12/03/19 1449 12/06/19 2014  NA 140 138  K 3.9 3.5  CL 103 103  CO2 24 24  GLUCOSE 83 88  BUN 11 11  CREATININE 0.51* 0.65  CALCIUM 8.7 8.5*   GFR: Estimated Creatinine Clearance: 98.6 mL/min (by C-G formula based on SCr of 0.65 mg/dL). Liver Function Tests: Recent Labs  Lab 12/06/19 2014  AST 37  ALT 17  ALKPHOS 74  BILITOT 0.5  PROT 6.9  ALBUMIN 2.6*   No results for input(s): LIPASE, AMYLASE in the last 168 hours. No results for input(s): AMMONIA in the last 168 hours. Coagulation Profile: No results for input(s): INR, PROTIME in the last 168 hours. Cardiac Enzymes: No results for input(s): CKTOTAL, CKMB, CKMBINDEX, TROPONINI in the last 168 hours. BNP (last 3 results) No results for input(s): PROBNP in the last 8760 hours. HbA1C: No results for input(s): HGBA1C in the last 72 hours. CBG: No results for input(s): GLUCAP in the last 168 hours. Lipid Profile: No results for input(s): CHOL, HDL, LDLCALC, TRIG, CHOLHDL, LDLDIRECT in the last 72 hours. Thyroid Function Tests: No results for input(s): TSH, T4TOTAL, FREET4, T3FREE, THYROIDAB in the last 72 hours. Anemia Panel: No results for input(s): VITAMINB12, FOLATE, FERRITIN, TIBC, IRON, RETICCTPCT in the last 72 hours. Urine analysis:    Component Value Date/Time   COLORURINE YELLOW 05/08/2019 1523   APPEARANCEUR HAZY (A) 05/08/2019 1523   LABSPEC 1.009 05/08/2019 1523   PHURINE 7.0 05/08/2019 1523   GLUCOSEU NEGATIVE 05/08/2019 1523   HGBUR SMALL (A) 05/08/2019 1523   BILIRUBINUR neg 12/03/2019 1542   KETONESUR NEGATIVE 05/08/2019 1523   PROTEINUR Positive (A) 12/03/2019 1542   PROTEINUR  >=300 (A) 05/08/2019 1523   UROBILINOGEN 0.2 12/03/2019 1542   NITRITE neg 12/03/2019 1542   NITRITE NEGATIVE 05/08/2019 1523   LEUKOCYTESUR Trace (A) 12/03/2019 1542   LEUKOCYTESUR MODERATE (A) 05/08/2019 1523    Radiological Exams on Admission: DG Chest Port 1 View  Result Date: 12/06/2019  CLINICAL DATA:  Shortness of breath EXAM: PORTABLE CHEST 1 VIEW COMPARISON:  April 19, 2019 FINDINGS: The heart size and mediastinal contours are within normal limits. Both lungs are clear. The visualized skeletal structures are unremarkable. IMPRESSION: No active disease. Electronically Signed   By: Constance Holster M.D.   On: 12/06/2019 21:41    EKG: Independently reviewed.  Sinus tachycardia, borderline prolonged QTC.  Rate increased since prior tracing.  Assessment/Plan Principal Problem:   Exacerbation of systemic lupus erythematosus ([place]) Active Problems:   Raynaud's disease   Depression   Essential hypertension   Right leg DVT (H[facility])   Suspected lupus flare: It seems her symptoms started after her prednisone was recently tapered off. Per rheumatology telephone note from 12/05/2019 under care everywhere, immunosuppressive therapy was put on hold due to concern for right lower extremity cellulitis. -Patient received Solu-Medrol 125 mg in the ED. Continue steroid coverage with prednisone 40 mg daily starting in the morning. Check C3 and C4 complement levels. Morphine as needed for pain. Please discuss with the patient's rheumatologist in a.m  Sinus tachycardia: Per review of prior notes, this appears to be a chronic issue. She is on anticoagulation with Xarelto due to history of right lower extremity DVT. Repeat Doppler of right lower extremity done during recent ED visit on 3/14 was negative for DVT. CT angiogram of chest done the same day was negative for PE or acute thoracic abnormality. Continues to be slightly tachycardic but heart rate has improved after bolus. Appears dehydrated with dry  mucous membranes on exam. Plan is to continue gentle IV fluid hydration. Check D-dimer and TSH levels. Resume home metoprolol.  Addendum: TSH normal.  D-dimer significantly elevated at 15.4.  Will order stat CT angiogram to rule out PE.  ?Right lower extremity cellulitis: Patient was recently started on Augmentin due to concern for right lower extremity cellulitis. A small wound/scab noted on the lateral aspect of the right lower extremity with no significant surrounding erythema or warmth to suggest cellulitis. Afebrile and labs showing no leukocytosis. Will hold further antibiotic therapy at this time.  Hypertension: Continue home amlodipine, metoprolol, lisinopril  Raynaud's disease: Continue amlodipine  Depression: Continue home bupropion  GERD: Continue PPI  History of right lower extremity DVT: Followed by hematology and currently on anticoagulation with Xarelto, continue. Ensure hematology follow-up.  DVT prophylaxis: Xarelto Code Status: Full code Family Communication: No family available at this time. Disposition Plan: Anticipate discharge after clinical improvement. Admission status: It is my clinical opinion that referral for OBSERVATION is reasonable and necessary in this patient based on the above information provided. The aforementioned taken together are felt to place the patient at high risk for further clinical deterioration. However it is anticipated that the patient may be medically stable for discharge from the hospital within 24 to 48 hours.  The medical decision making on this patient was of high complexity and the patient is at high risk for clinical deterioration, therefore this is a level 3 visit.  Shela Leff MD [facility]  If 7PM-7AM, please contact night-coverage [email or web address]  12/07/2019, 2:36 AM

## 2019-12-07 NOTE — Progress Notes (Addendum)
[facility] PROGRESS NOTE    Progress Note  [name] [name]  [id number] DOB: [birth year] DOA: 12/06/2019 PCP: Default, Provider, MD     Brief Narrative:   [name] [name] is an 24 y.o. female past medical history of lupus nephritis PE and right lower extremity DVT in September 2020 on Xarelto, depression and hypertension presents with bilateral lower extremity swelling, she has been recently tapered off her steroids, but for about 1 week she has had bilateral lower extremity pain.  Procedures: CT angio chest was negative for PE. X-ray shows no acute disease.  Assessment/Plan:   Exacerbation of systemic lupus erythematosus ([place]): She was recently tapered off steroids per rheumatology on 12/05/2019 due to concerns of right lower extremity cellulitis. She was started on IV steroids, C3 and C4 complements are pending. She continues to have lower extremity pain but she relates is better than yesterday..  Sinus tachycardia: Reviewing chart it appears to be chronic.  She is on Xarelto for her lower extremity DVT.  Lower extremity Doppler on 11/24/2019 was negative CT angio of the chest done on admission was negative. He appears to be dry on physical exam she was started on IV fluids and metoprolol her tachycardia has resolved.  Right lower extremity scab: Patient was recently started on Augmentin for lower extremity cellulitis. On physical examination there is no significant edema warm to touch she has remained afebrile with no leukocytosis continue to hold empiric antibiotics.  Essential hypertension: Continue amlodipine metoprolol lisinopril.  Raynaud's disease: Continue meropenem.  Depression: Continue bupropion and  DVT prophylaxis: Xarelto Family Communication:none Disposition Plan/Barrier to D/C: Home in 1 to 2 days, once her pain is controlled, she came from home, physical therapy evaluation is pending.  Code Status:     Code Status Orders  (From admission, onward)           Start     Ordered   12/07/19 0151  Full code  Continuous     12/07/19 0151        Code Status History    Date Active Date Inactive Code Status Order ID Comments User Context   05/08/2019 2106 05/11/2019 1830 Full Code [id number]  Toy Baker, MD ED   04/19/2019 1756 04/22/2019 1756 Full Code [id number]  Florencia Reasons, MD Inpatient   03/19/2019 1515 03/25/2019 2108 Full Code [id number]  Kerney Elbe, DO Inpatient   06/10/2018 0100 06/11/2018 1638 Full Code [id number]  Reubin Milan, MD Inpatient   06/05/2018 1528 06/09/2018 1605 Full Code [id number]  Karmen Bongo, MD ED   01/08/2018 2308 01/13/2018 2009 Full Code [id number]  Etta Quill, DO ED   Advance Care Planning Activity        IV Access:    Peripheral IV   Procedures and diagnostic studies:   CT ANGIO CHEST PE W OR WO CONTRAST  Result Date: 12/07/2019 CLINICAL DATA:  Lupus nephritis, right lower extremity DVT September 2020. Elevated D-dimer. EXAM: CT ANGIOGRAPHY CHEST WITH CONTRAST TECHNIQUE: Multidetector CT imaging of the chest was performed using the standard protocol during bolus administration of intravenous contrast. Multiplanar CT image reconstructions and MIPs were obtained to evaluate the vascular anatomy. CONTRAST:  164mL OMNIPAQUE IOHEXOL 350 MG/ML SOLN COMPARISON:  11/24/2018 FINDINGS: Cardiovascular: Satisfactory opacification of the pulmonary arteries to the segmental level. No evidence of pulmonary embolism. Normal heart size. No pericardial effusion. Mediastinum/Nodes: No enlarged mediastinal, hilar, or axillary lymph nodes. Thyroid gland, trachea, and esophagus demonstrate no significant findings. Lungs/Pleura: No focal consolidation. Mild  bibasilar atelectasis. No pleural effusion or pneumothorax. Upper Abdomen: No acute abnormality. Musculoskeletal: No acute osseous abnormality. No aggressive osseous lesion. Review of the MIP images confirms the above findings. IMPRESSION: 1. No evidence of  pulmonary embolus. 2. No acute cardiopulmonary disease. Electronically Signed   By: Kathreen Devoid   On: 12/07/2019 06:47   DG Chest Port 1 View  Result Date: 12/06/2019 CLINICAL DATA:  Shortness of breath EXAM: PORTABLE CHEST 1 VIEW COMPARISON:  April 19, 2019 FINDINGS: The heart size and mediastinal contours are within normal limits. Both lungs are clear. The visualized skeletal structures are unremarkable. IMPRESSION: No active disease. Electronically Signed   By: Constance Holster M.D.   On: 12/06/2019 21:41     Medical Consultants:    None.  Anti-Infectives:   None  Subjective:    [name] [name] she relates she still having lower extremity pain but is improved compared to yesterday.  Objective:    Vitals:   12/07/19 0045 12/07/19 0115 12/07/19 0130 12/07/19 0411  BP: 108/61 112/78 106/63 106/77  Pulse: (!) 102 99 (!) 103 97  Resp: 15 17 14 18   Temp:    98.3 F (36.8 C)  TempSrc:    Oral  SpO2: 98% 97% 97% 100%  Weight:      Height:       SpO2: 100 %   Intake/Output Summary (Last 24 hours) at 12/07/2019 0721 Last data filed at 12/07/2019 0249 Gross per 24 hour  Intake --  Output 300 ml  Net -300 ml   Filed Weights   12/06/19 1939  Weight: 66.7 kg    Exam: General exam: In no acute distress. Respiratory system: Good air movement and clear to auscultation. Cardiovascular system: S1 & S2 heard, RRR. No JVD. Gastrointestinal system: Abdomen is nondistended, soft and nontender.  Extremities: No pedal edema. Skin: She has like a bruise on her right lower extremity lateral tibia and above her ankle not tender warm to touch, his skin surrounding it is not erythematous.  Data Reviewed:    Labs: Basic Metabolic Panel: Recent Labs  Lab 12/03/19 1449 12/06/19 2014  NA 140 138  K 3.9 3.5  CL 103 103  CO2 24 24  GLUCOSE 83 88  BUN 11 11  CREATININE 0.51* 0.65  CALCIUM 8.7 8.5*   GFR Estimated Creatinine Clearance: 98.6 mL/min (by C-G formula based on  SCr of 0.65 mg/dL). Liver Function Tests: Recent Labs  Lab 12/06/19 2014  AST 37  ALT 17  ALKPHOS 74  BILITOT 0.5  PROT 6.9  ALBUMIN 2.6*   No results for input(s): LIPASE, AMYLASE in the last 168 hours. No results for input(s): AMMONIA in the last 168 hours. Coagulation profile No results for input(s): INR, PROTIME in the last 168 hours. COVID-19 Labs  Recent Labs    12/07/19 0224  DDIMER 15.43*    Lab Results  Component Value Date   SARSCOV2NAA NEGATIVE 05/08/2019   SARSCOV2NAA NEGATIVE 04/19/2019   [place] NEGATIVE 03/19/2019    CBC: Recent Labs  Lab 12/03/19 1449 12/06/19 2014  WBC 3.5 4.2  NEUTROABS  --  3.6  HGB 9.2* 8.8*  HCT 28.6* 28.1*  MCV 81 84.1  PLT 174 142*   Cardiac Enzymes: No results for input(s): CKTOTAL, CKMB, CKMBINDEX, TROPONINI in the last 168 hours. BNP (last 3 results) No results for input(s): PROBNP in the last 8760 hours. CBG: No results for input(s): GLUCAP in the last 168 hours. D-Dimer: Recent Labs  12/07/19 0224  DDIMER 15.43*   Hgb A1c: No results for input(s): HGBA1C in the last 72 hours. Lipid Profile: No results for input(s): CHOL, HDL, LDLCALC, TRIG, CHOLHDL, LDLDIRECT in the last 72 hours. Thyroid function studies: Recent Labs    12/07/19 0224  TSH 1.180   Anemia work up: No results for input(s): VITAMINB12, FOLATE, FERRITIN, TIBC, IRON, RETICCTPCT in the last 72 hours. Sepsis Labs: Recent Labs  Lab 12/03/19 1449 12/06/19 2014  WBC 3.5 4.2   Microbiology No results found for this or any previous visit (from the past 240 hour(s)).   Medications:   . amLODipine  10 mg Oral Daily  . buPROPion  300 mg Oral Daily  . ferrous sulfate  325 mg Oral BID  . folic acid  1 mg Oral Daily  . gabapentin  300 mg Oral TID  . lisinopril  20 mg Oral Daily  . metoprolol tartrate  25 mg Oral BID  . pantoprazole  20 mg Oral QHS  . predniSONE  40 mg Oral Q breakfast  . rivaroxaban  10 mg Oral Daily  . sodium  chloride (PF)      . traZODone  100 mg Oral QHS   Continuous Infusions: . sodium chloride 125 mL/hr at 12/07/19 0414      LOS: 0 days   Charlynne Cousins  [facility]  12/07/2019, 7:21 AM

## 2019-12-07 NOTE — ED Notes (Signed)
ED TO INPATIENT HANDOFF REPORT  ED Nurse Name and Phone #: jon wled   S Name/Age/Gender [name] [name] 24 y.o. female Room/Bed: WA10/WA10  Code Status   Code Status: Full Code  Home/SNF/Other Home Patient oriented to: self, place, time and situation Is this baseline? Yes   Triage Complete: Triage complete  Chief Complaint Exacerbation of systemic lupus erythematosus ([place]) [M32.9]  Triage Note Pt comes to ed via ems, c/o  "Both her legs hurt" Pt is a 24 year old african [place]  Female, with complains that her legs and hands are swollen/ as well right toe pain, warm to touch and burning and itching for the past two weeks. Pt has a auto immune disorder of lupus and past medical hx of gout, and has been weaned off her steroid. Tonight her pain increased.V/s on arrival 114/64, hr 112, rr22, spo2 96 room air, temp 99.5.alert x 4 and pt states hurts to wtb and utilizes her wheel chair with family assistance.      Allergies Allergies  Allergen Reactions  . Hydroxychloroquine Rash  . Latex Rash    Level of Care/Admitting Diagnosis ED Disposition    ED Disposition Condition Comment   Admit  Hospital Area: [facility] [id number]  Level of Care: Telemetry [5]  Admit to tele based on following criteria: Complex arrhythmia (Bradycardia/Tachycardia)  Covid Evaluation: Asymptomatic Screening Protocol (No Symptoms)  Diagnosis: Exacerbation of systemic lupus erythematosus ([place]) [id number]  Admitting Physician: Shela Leff [id number]  Attending Physician: Shela Leff [id number]       B Medical/Surgery History Past Medical History:  Diagnosis Date  . Accidental methotrexate overdose 01/08/2018  . Acute lower UTI   . Bronchitis 03/19/2019  . Cardiomegaly 05/08/2019  . Chronic midline low back pain without sciatica 11/09/2016   Facet joint arthropathy  . Dehydration 03/19/2019  . Depo-Provera contraceptive status 08/11/2015  . Depression 02/08/2017  .  Dysphagia 06/01/2018  . Dysuria 04/19/2019  . Elevated AST (SGOT) 03/19/2019  . Essential hypertension 03/19/2019  . Facial edema   . Gout   . Hypertension   . IDA (iron deficiency anemia) 08/11/2015  . Immunosuppressed status ([place])   . Insomnia 01/29/2016  . Leukopenia 01/10/2018  . Lobar pneumonia ([place]) 06/07/2018  . Lupus ([place])   . Lupus (systemic lupus erythematosus) ([place]) 03/19/2019  . Metabolic acidosis [id number]  . Methotrexate toxicity   . Mouth ulcers   . Mucositis oral 01/12/2018  . Odynophagia 06/01/2018  . Oral thrush 03/19/2019  . Pleuritic chest pain 06/05/2018  . Primary osteoarthritis of both knees 11/09/2016  . Rash due to systemic lupus erythematosus (SLE) ([place])   . Raynaud disease   . Raynaud's disease 08/11/2015  . Relative acute adrenal insufficiency ([place]) 06/11/2018  . Right leg DVT ([place]) 05/08/2019  . Seasonal allergies 08/11/2015  . Sepsis ([place]) 05/08/2019  . Sinus tachycardia 03/19/2019  . SLE (systemic lupus erythematosus) ([place]) 09/19/2016     Positive ANA, double-stranded DNA, Ro, RNP, RF, history of nasal ulcers, fatigue, anemia, arthritis, Raynauds  . Thrush 06/01/2018  . Vaginal candidiasis 01/12/2018  . Vitamin D deficiency 11/09/2016   Past Surgical History:  Procedure Laterality Date  . ORIF SHOULDER FRACTURE Left   . TONSILLECTOMY       A IV Location/Drains/Wounds Patient Lines/Drains/Airways Status   Active Line/Drains/Airways    Name:   Placement date:   Placement time:   Site:   Days:   Peripheral IV 12/06/19 Left Antecubital   12/06/19  2111    Antecubital   1   Peripheral IV 12/07/19 Right;Anterior Forearm   12/07/19    0224    Forearm   less than 1   Wound / Incision (Open or Dehisced) 01/09/18 Buttocks Left;Right Raw areas noted between pt buttocks, painful/red   01/09/18    0100    Buttocks   697          Intake/Output Last 24 hours  Intake/Output Summary (Last 24 hours) at 12/07/2019 0343 Last data filed at 12/07/2019 0249 Gross per 24 hour   Intake --  Output 300 ml  Net -300 ml    Labs/Imaging Results for orders placed or performed during the hospital encounter of 12/06/19 (from the past 48 hour(s))  CBC with Differential/Platelet     Status: Abnormal   Collection Time: 12/06/19  8:14 PM  Result Value Ref Range   WBC 4.2 4.0 - 10.5 K/uL   RBC 3.34 (L) 3.87 - 5.11 MIL/uL   Hemoglobin 8.8 (L) 12.0 - 15.0 g/dL   HCT 28.1 (L) 36.0 - 46.0 %   MCV 84.1 80.0 - 100.0 fL   MCH 26.3 26.0 - 34.0 pg   MCHC 31.3 30.0 - 36.0 g/dL   RDW 16.2 (H) 11.5 - 15.5 %   Platelets 142 (L) 150 - 400 K/uL    Comment: REPEATED TO VERIFY PLATELET COUNT CONFIRMED BY SMEAR SPECIMEN CHECKED FOR CLOTS    nRBC 0.5 (H) 0.0 - 0.2 %   Neutrophils Relative % 84 %   Neutro Abs 3.6 1.7 - 7.7 K/uL   Lymphocytes Relative 8 %   Lymphs Abs 0.4 (L) 0.7 - 4.0 K/uL   Monocytes Relative 5 %   Monocytes Absolute 0.2 0.1 - 1.0 K/uL   Eosinophils Relative 1 %   Eosinophils Absolute 0.0 0.0 - 0.5 K/uL   Basophils Relative 0 %   Basophils Absolute 0.0 0.0 - 0.1 K/uL   Immature Granulocytes 2 %   Abs Immature Granulocytes 0.09 (H) 0.00 - 0.07 K/uL    Comment: Performed at [facility], [place] [address], [place], [place] [address]  Comprehensive metabolic panel     Status: Abnormal   Collection Time: 12/06/19  8:14 PM  Result Value Ref Range   Sodium 138 135 - 145 mmol/L   Potassium 3.5 3.5 - 5.1 mmol/L   Chloride 103 98 - 111 mmol/L   CO2 24 22 - 32 mmol/L   Glucose, Bld 88 70 - 99 mg/dL    Comment: Glucose reference range applies only to samples taken after fasting for at least 8 hours.   BUN 11 6 - 20 mg/dL   Creatinine, Ser 0.65 0.44 - 1.00 mg/dL   Calcium 8.5 (L) 8.9 - 10.3 mg/dL   Total Protein 6.9 6.5 - 8.1 g/dL   Albumin 2.6 (L) 3.5 - 5.0 g/dL   AST 37 15 - 41 U/L   ALT 17 0 - 44 U/L   Alkaline Phosphatase 74 38 - 126 U/L   Total Bilirubin 0.5 0.3 - 1.2 mg/dL   GFR calc non Af Amer >60 >60 mL/min   GFR calc Af Amer >60 >60  mL/min   Anion gap 11 5 - 15    Comment: Performed at [facility], [place] [address] [place], [place] [address]  Urinalysis, Routine w reflex microscopic     Status: Abnormal   Collection Time: 12/06/19  8:14 PM  Result Value Ref Range   Color, Urine  YELLOW YELLOW   APPearance CLEAR CLEAR   Specific Gravity, Urine 1.016 1.005 - 1.030   pH 6.0 5.0 - 8.0   Glucose, UA NEGATIVE NEGATIVE mg/dL   Hgb urine dipstick SMALL (A) NEGATIVE   Bilirubin Urine NEGATIVE NEGATIVE   Ketones, ur 5 (A) NEGATIVE mg/dL   Protein, ur >=300 (A) NEGATIVE mg/dL   Nitrite NEGATIVE NEGATIVE   Leukocytes,Ua NEGATIVE NEGATIVE   RBC / HPF 11-20 0 - 5 RBC/hpf   WBC, UA 0-5 0 - 5 WBC/hpf   Bacteria, UA RARE (A) NONE SEEN   Hyaline Casts, UA PRESENT     Comment: Performed at [facility], [place] [address] [place], [place] [address]  I-Stat beta hCG blood, ED ([facility], [facility], [facility] only)     Status: None   Collection Time: 12/06/19  9:11 PM  Result Value Ref Range   I-stat hCG, quantitative <5.0 <5 mIU/mL   Comment 3            Comment:   GEST. AGE      CONC.  (mIU/mL)   <=1 WEEK        5 - 50     2 WEEKS       50 - 500     3 WEEKS       100 - 10,000     4 WEEKS     1,000 - 30,000        FEMALE AND NON-PREGNANT FEMALE:     LESS THAN 5 mIU/mL   D-dimer, quantitative (not at [facility])     Status: Abnormal   Collection Time: 12/07/19  2:24 AM  Result Value Ref Range   D-Dimer, Quant 15.43 (H) 0.00 - 0.50 ug/mL-FEU    Comment: (NOTE) At the manufacturer cut-off of 0.50 ug/mL FEU, this assay has been documented to exclude PE with a sensitivity and negative predictive value of 97 to 99%.  At this time, this assay has not been approved by the FDA to exclude DVT/VTE. Results should be correlated with clinical presentation. Performed at [facility], [place] [address] [place], [place] [address]   TSH     Status: None   Collection Time: 12/07/19  2:24 AM  Result Value Ref  Range   TSH 1.180 0.350 - 4.500 uIU/mL    Comment: Performed by a 3rd Generation assay with a functional sensitivity of <=0.01 uIU/mL. Performed at [facility], [place] [address], [place], [place] [address]    DG Chest Port 1 View  Result Date: 12/06/2019 CLINICAL DATA:  Shortness of breath EXAM: PORTABLE CHEST 1 VIEW COMPARISON:  April 19, 2019 FINDINGS: The heart size and mediastinal contours are within normal limits. Both lungs are clear. The visualized skeletal structures are unremarkable. IMPRESSION: No active disease. Electronically Signed   By: Constance Holster M.D.   On: 12/06/2019 21:41    Pending Labs Unresulted Labs (From admission, onward)    Start     Ordered   12/07/19 0239  SARS CORONAVIRUS 2 (TAT 6-24 HRS) Nasopharyngeal Nasopharyngeal Swab  (Tier 3 (TAT 6-24 hrs))  Once,   STAT    Question Answer Comment  Is this test for diagnosis or screening Screening   Symptomatic for COVID-19 as defined by CDC No   Hospitalized for COVID-19 No   Admitted to ICU for COVID-19 No   Previously tested for COVID-19 Yes   Resident in a congregate (group) care setting Unknown   Employed in healthcare setting  Unknown   Pregnant Unknown      12/07/19 0238   12/07/19 0153  C3 complement  Once,   STAT     12/07/19 0152   12/07/19 0153  C4 complement  Once,   STAT     12/07/19 0152   12/07/19 0151  HIV Antibody (routine testing w rflx)  (HIV Antibody (Routine testing w reflex) panel)  Once,   STAT     12/07/19 0151          Vitals/Pain Today's Vitals   12/07/19 0030 12/07/19 0045 12/07/19 0115 12/07/19 0130  BP: 108/63 108/61 112/78 106/63  Pulse: (!) 105 (!) 102 99 (!) 103  Resp: 15 15 17 14   Temp:      TempSrc:      SpO2: 97% 98% 97% 97%  Weight:      Height:      PainSc:        Isolation Precautions No active isolations  Medications Medications  0.9 %  sodium chloride infusion ( Intravenous New Bag/Given 12/06/19 2112)  morphine 2 MG/ML injection  2 mg (has no administration in time range)  amLODipine (NORVASC) tablet 10 mg (has no administration in time range)  lisinopril (ZESTRIL) tablet 20 mg (has no administration in time range)  metoprolol tartrate (LOPRESSOR) tablet 25 mg (has no administration in time range)  buPROPion (WELLBUTRIN XL) 24 hr tablet 300 mg (has no administration in time range)  traZODone (DESYREL) tablet 100 mg (has no administration in time range)  pantoprazole (PROTONIX) EC tablet 20 mg (has no administration in time range)  ferrous sulfate tablet 325 mg (has no administration in time range)  folic acid (FOLVITE) tablet 1 mg (has no administration in time range)  rivaroxaban (XARELTO) tablet 10 mg (has no administration in time range)  gabapentin (NEURONTIN) capsule 300 mg (has no administration in time range)  acetaminophen (TYLENOL) tablet 650 mg (has no administration in time range)    Or  acetaminophen (TYLENOL) suppository 650 mg (has no administration in time range)  predniSONE (DELTASONE) tablet 40 mg (has no administration in time range)  morphine 4 MG/ML injection 6 mg (6 mg Intravenous Given 12/06/19 2113)  methylPREDNISolone sodium succinate (SOLU-MEDROL) 125 mg/2 mL injection 125 mg (125 mg Intravenous Given 12/06/19 2113)  sodium chloride 0.9 % bolus 1,000 mL (0 mLs Intravenous Stopped 12/06/19 2205)    Mobility walks with device Low fall risk   Focused Assessments   R Recommendations: See Admitting Provider Note  Report given to:   Additional Notes:

## 2019-12-08 DIAGNOSIS — Z888 Allergy status to other drugs, medicaments and biological substances status: Secondary | ICD-10-CM | POA: Diagnosis not present

## 2019-12-08 DIAGNOSIS — F329 Major depressive disorder, single episode, unspecified: Secondary | ICD-10-CM | POA: Diagnosis present

## 2019-12-08 DIAGNOSIS — Z86711 Personal history of pulmonary embolism: Secondary | ICD-10-CM | POA: Diagnosis not present

## 2019-12-08 DIAGNOSIS — M329 Systemic lupus erythematosus, unspecified: Secondary | ICD-10-CM | POA: Diagnosis not present

## 2019-12-08 DIAGNOSIS — Z79899 Other long term (current) drug therapy: Secondary | ICD-10-CM | POA: Diagnosis not present

## 2019-12-08 DIAGNOSIS — Z801 Family history of malignant neoplasm of trachea, bronchus and lung: Secondary | ICD-10-CM | POA: Diagnosis not present

## 2019-12-08 DIAGNOSIS — Z86718 Personal history of other venous thrombosis and embolism: Secondary | ICD-10-CM | POA: Diagnosis not present

## 2019-12-08 DIAGNOSIS — M170 Bilateral primary osteoarthritis of knee: Secondary | ICD-10-CM | POA: Diagnosis present

## 2019-12-08 DIAGNOSIS — K219 Gastro-esophageal reflux disease without esophagitis: Secondary | ICD-10-CM | POA: Diagnosis present

## 2019-12-08 DIAGNOSIS — E559 Vitamin D deficiency, unspecified: Secondary | ICD-10-CM | POA: Diagnosis present

## 2019-12-08 DIAGNOSIS — L03115 Cellulitis of right lower limb: Secondary | ICD-10-CM | POA: Diagnosis present

## 2019-12-08 DIAGNOSIS — Z823 Family history of stroke: Secondary | ICD-10-CM | POA: Diagnosis not present

## 2019-12-08 DIAGNOSIS — Z91040 Latex allergy status: Secondary | ICD-10-CM | POA: Diagnosis not present

## 2019-12-08 DIAGNOSIS — Z8249 Family history of ischemic heart disease and other diseases of the circulatory system: Secondary | ICD-10-CM | POA: Diagnosis not present

## 2019-12-08 DIAGNOSIS — R000 Tachycardia, unspecified: Secondary | ICD-10-CM | POA: Diagnosis present

## 2019-12-08 DIAGNOSIS — M3210 Systemic lupus erythematosus, organ or system involvement unspecified: Secondary | ICD-10-CM | POA: Diagnosis present

## 2019-12-08 DIAGNOSIS — I7300 Raynaud's syndrome without gangrene: Secondary | ICD-10-CM | POA: Diagnosis present

## 2019-12-08 DIAGNOSIS — M3214 Glomerular disease in systemic lupus erythematosus: Secondary | ICD-10-CM | POA: Diagnosis present

## 2019-12-08 DIAGNOSIS — Z20822 Contact with and (suspected) exposure to covid-19: Secondary | ICD-10-CM | POA: Diagnosis present

## 2019-12-08 DIAGNOSIS — E2740 Unspecified adrenocortical insufficiency: Secondary | ICD-10-CM | POA: Diagnosis present

## 2019-12-08 DIAGNOSIS — Z7901 Long term (current) use of anticoagulants: Secondary | ICD-10-CM | POA: Diagnosis not present

## 2019-12-08 DIAGNOSIS — I10 Essential (primary) hypertension: Secondary | ICD-10-CM | POA: Diagnosis present

## 2019-12-08 DIAGNOSIS — R52 Pain, unspecified: Secondary | ICD-10-CM | POA: Diagnosis not present

## 2019-12-08 LAB — C4 COMPLEMENT: Complement C4, Body Fluid: 10 mg/dL — ABNORMAL LOW (ref 12–38)

## 2019-12-08 LAB — C3 COMPLEMENT: C3 Complement: 70 mg/dL — ABNORMAL LOW (ref 82–167)

## 2019-12-08 LAB — SEDIMENTATION RATE: Sed Rate: 120 mm/hr — ABNORMAL HIGH (ref 0–22)

## 2019-12-08 MED ORDER — ENSURE ENLIVE PO LIQD
237.0000 mL | Freq: Two times a day (BID) | ORAL | Status: DC
  Administered 2019-12-08 – 2019-12-09 (×2): 237 mL via ORAL

## 2019-12-08 MED ORDER — ALUM & MAG HYDROXIDE-SIMETH 200-200-20 MG/5ML PO SUSP
15.0000 mL | ORAL | Status: DC | PRN
  Administered 2019-12-08: 15 mL via ORAL
  Filled 2019-12-08: qty 30

## 2019-12-08 MED ORDER — PREDNISONE 20 MG PO TABS
30.0000 mg | ORAL_TABLET | Freq: Every day | ORAL | Status: DC
  Administered 2019-12-09: 30 mg via ORAL
  Filled 2019-12-08: qty 1

## 2019-12-08 MED ORDER — ADULT MULTIVITAMIN W/MINERALS CH
1.0000 | ORAL_TABLET | Freq: Every day | ORAL | Status: DC
  Administered 2019-12-08 – 2019-12-09 (×2): 1 via ORAL
  Filled 2019-12-08 (×2): qty 1

## 2019-12-08 NOTE — Progress Notes (Addendum)
Initial Nutrition Assessment  RD working remotely.  DOCUMENTATION CODES:   Not applicable  INTERVENTION:   -Ensure Enlive po BID, each supplement provides 350 kcal and 20 grams of protein -MVI with minerals daily  NUTRITION DIAGNOSIS:   Increased nutrient needs related to chronic illness(lupus) as evidenced by estimated needs.  GOAL:   Patient will meet greater than or equal to 90% of their needs  MONITOR:   PO intake, Supplement acceptance, Labs, Weight trends, Skin, I & O's  REASON FOR ASSESSMENT:   Malnutrition Screening Tool    ASSESSMENT:   [name] [name] is an 24 y.o. female past medical history of lupus nephritis PE and right lower extremity DVT in September 2020 on Xarelto, depression and hypertension presents with bilateral lower extremity swelling, she has been recently tapered off her steroids, but for about 1 week she has had bilateral lower extremity pain.  Pt admitted with lupus exacerbation.   Reviewed I/O's: +3.3 L x 24 hours  Attempted to speak with pt via phone, however, no answer.   Per MD notes, pt with bilateral extremity weakness; PT eval pending.   Reviewed wt hx; noted pt with mild wt gain over the past 6 months (pt also currently with edema).   No meal intake data to assess at this time.   Medications reviewed and include 0.9% sodium chloride infusion @ 125 ml/hr and prednisone.   Labs reviewed.   Diet Order:   Diet Order            Diet Heart Room service appropriate? Yes; Fluid consistency: Thin  Diet effective now              EDUCATION NEEDS:   No education needs have been identified at this time  Skin:  Skin Assessment: Reviewed RN Assessment  Last BM:  12/07/19  Height:   Ht Readings from Last 1 Encounters:  12/06/19 5\' 3"  (1.6 m)    Weight:   Wt Readings from Last 1 Encounters:  12/06/19 66.7 kg    Ideal Body Weight:  52.3 kg  BMI:  Body mass index is 26.04 kg/m.  Estimated Nutritional Needs:   Kcal:   1700-1900  Protein:  85-100 grams  Fluid:  > 1.7 L    Loistine Chance, RD, LDN, [place] Registered Dietitian II Certified Diabetes Care and Education Specialist Please refer to [facility] for RD and/or RD on-call/weekend/after hours pager

## 2019-12-08 NOTE — Evaluation (Signed)
Physical Therapy Evaluation-1x Patient Details Name: [name] [name] MRN: [id number] DOB: 04/08/1996 Today's Date: 12/08/2019   History of Present Illness  24 yo female admitted with lupus exacerbation. Hx of lupus, gout, dvt, cellulitis  Clinical Impression  On eval, pt was Supv-Mod Ind with mobility. She walked ~150 feet around the unit. Pt reported some pain in R LE/ankle. She participated well. Do not anticipate any f/u PT needs at this time. 1x eval. Will sign off.     Follow Up Recommendations No PT follow up    Equipment Recommendations  None recommended by PT    Recommendations for Other Services       Precautions / Restrictions Precautions Precautions: Fall Restrictions Weight Bearing Restrictions: No      Mobility  Bed Mobility Overal bed mobility: Modified Independent                Transfers Overall transfer level: Modified independent                  Ambulation/Gait Ambulation/Gait assistance: Supervision Gait Distance (Feet): 150 Feet Assistive device: None Gait Pattern/deviations: Step-through pattern     General Gait Details: slightly antalgic gait but no LOB  Stairs            Wheelchair Mobility    Modified Rankin (Stroke Patients Only)       Balance Overall balance assessment: Mild deficits observed, not formally tested                                           Pertinent Vitals/Pain Pain Assessment: Faces Faces Pain Scale: Hurts little more Pain Location: ankles Pain Descriptors / Indicators: Discomfort;Sore Pain Intervention(s): Monitored during session;Repositioned    Home Living Family/patient expects to be discharged to:: Private residence Living Arrangements: Other relatives Available Help at Discharge: Family;Available PRN/intermittently Type of Home: House Home Access: Stairs to enter   Entrance Stairs-Number of Steps: 1 Home Layout: One level Home Equipment: Walker - 2  wheels;Wheelchair - manual      Prior Function Level of Independence: Independent with assistive device(s)         Comments: uses RW when "bad days"     Hand Dominance        Extremity/Trunk Assessment   Upper Extremity Assessment Upper Extremity Assessment: Overall WFL for tasks assessed    Lower Extremity Assessment Lower Extremity Assessment: Generalized weakness    Cervical / Trunk Assessment Cervical / Trunk Assessment: Normal  Communication   Communication: No difficulties  Cognition Arousal/Alertness: Awake/alert Behavior During Therapy: WFL for tasks assessed/performed Overall Cognitive Status: Within Functional Limits for tasks assessed                                        General Comments      Exercises     Assessment/Plan    PT Assessment Patent does not need any further PT services  PT Problem List         PT Treatment Interventions      PT Goals (Current goals can be found in the Care Plan section)  Acute Rehab PT Goals Patient Stated Goal: home soon PT Goal Formulation: All assessment and education complete, DC therapy Time For Goal Achievement: 12/22/19    Frequency     Barriers  to discharge        Co-evaluation               AM-PAC PT "6 Clicks" Mobility  Outcome Measure Help needed turning from your back to your side while in a flat bed without using bedrails?: None Help needed moving from lying on your back to sitting on the side of a flat bed without using bedrails?: None Help needed moving to and from a bed to a chair (including a wheelchair)?: None Help needed standing up from a chair using your arms (e.g., wheelchair or bedside chair)?: None Help needed to walk in hospital room?: A Little Help needed climbing 3-5 steps with a railing? : A Little 6 Click Score: 22    End of Session   Activity Tolerance: Patient tolerated treatment well Patient left: in bed;with call bell/phone within reach         Time: 1109-1120 PT Time Calculation (min) (ACUTE ONLY): 11 min   Charges:   PT Evaluation $PT Eval Low Complexity: 1 Low            Orlyn Odonoghue P, PT Acute Rehabilitation

## 2019-12-08 NOTE — Progress Notes (Addendum)
[facility] PROGRESS NOTE    Progress Note  [name] [name]  [id number] DOB: 04-09-1996 DOA: 12/06/2019 PCP: Default, Provider, MD     Brief Narrative:   [name] [name] is an 24 y.o. female past medical history of lupus nephritis PE and right lower extremity DVT in September 2020 on Xarelto, depression and hypertension presents with bilateral lower extremity swelling, she has been recently tapered off her steroids, but for about 1 week she has had bilateral lower extremity pain.  Procedures: CT angio chest was negative for PE. X-ray shows no acute disease.  Assessment/Plan:   Exacerbation of systemic lupus erythematosus ([place]): She was recently tapered off steroids per rheumatology on 12/05/2019 due to concerns of right lower extremity cellulitis. Continue oral steroids will try to taper down, C3 and C4 complements are pending, she will probably have to go home on steroids.  Question adrenal insufficiency: She relates that when she got off her steroids she started feeling bad, will continue to titrate the steroids down she will probably go home on steroids. She relates she was significantly nauseated before coming to the hospital, this morning it appears to her nausea is improved she would like to try a diet.  Sinus tachycardia: Continue metoprolol no signs of thromboembolic events.   KVO IV fluids.  Right lower extremity scab: Patient was recently started on Augmentin for lower extremity cellulitis. On physical examination there is no significant edema warm to touch she has remained afebrile with no leukocytosis continue to hold empiric antibiotics.  Essential hypertension: Continue amlodipine, metoprolol and lisinopril.  Raynaud's disease: Continue Norvasc  Depression: Continue bupropion and  DVT prophylaxis: Xarelto Family Communication:none Disposition Plan/Barrier to D/C: Home in 1 to 2 days, once her pain is controlled, she came from home, physical therapy  evaluation is pending.  Code Status:     Code Status Orders  (From admission, onward)         Start     Ordered   12/07/19 0151  Full code  Continuous     12/07/19 0151        Code Status History    Date Active Date Inactive Code Status Order ID Comments User Context   05/08/2019 2106 05/11/2019 1830 Full Code [id number]  Toy Baker, MD ED   04/19/2019 1756 04/22/2019 1756 Full Code [id number]  Florencia Reasons, MD Inpatient   03/19/2019 1515 03/25/2019 2108 Full Code [id number]  Kerney Elbe, DO Inpatient   06/10/2018 0100 06/11/2018 1638 Full Code [id number]  Reubin Milan, MD Inpatient   06/05/2018 1528 06/09/2018 1605 Full Code [id number]  Karmen Bongo, MD ED   01/08/2018 2308 01/13/2018 2009 Full Code [id number]  Etta Quill, DO ED   Advance Care Planning Activity        IV Access:    Peripheral IV   Procedures and diagnostic studies:   CT ANGIO CHEST PE W OR WO CONTRAST  Result Date: 12/07/2019 CLINICAL DATA:  Lupus nephritis, right lower extremity DVT September 2020. Elevated D-dimer. EXAM: CT ANGIOGRAPHY CHEST WITH CONTRAST TECHNIQUE: Multidetector CT imaging of the chest was performed using the standard protocol during bolus administration of intravenous contrast. Multiplanar CT image reconstructions and MIPs were obtained to evaluate the vascular anatomy. CONTRAST:  175mL OMNIPAQUE IOHEXOL 350 MG/ML SOLN COMPARISON:  11/24/2018 FINDINGS: Cardiovascular: Satisfactory opacification of the pulmonary arteries to the segmental level. No evidence of pulmonary embolism. Normal heart size. No pericardial effusion. Mediastinum/Nodes: No enlarged mediastinal, hilar, or axillary lymph nodes. Thyroid gland,  trachea, and esophagus demonstrate no significant findings. Lungs/Pleura: No focal consolidation. Mild bibasilar atelectasis. No pleural effusion or pneumothorax. Upper Abdomen: No acute abnormality. Musculoskeletal: No acute osseous abnormality. No aggressive osseous  lesion. Review of the MIP images confirms the above findings. IMPRESSION: 1. No evidence of pulmonary embolus. 2. No acute cardiopulmonary disease. Electronically Signed   By: Kathreen Devoid   On: 12/07/2019 06:47   DG Chest Port 1 View  Result Date: 12/06/2019 CLINICAL DATA:  Shortness of breath EXAM: PORTABLE CHEST 1 VIEW COMPARISON:  April 19, 2019 FINDINGS: The heart size and mediastinal contours are within normal limits. Both lungs are clear. The visualized skeletal structures are unremarkable. IMPRESSION: No active disease. Electronically Signed   By: Constance Holster M.D.   On: 12/06/2019 21:41     Medical Consultants:    None.  Anti-Infectives:   None  Subjective:    [name] [name] relates her lower extremity pain is better, her nausea has improved she would like to start diet.  Objective:    Vitals:   12/07/19 1541 12/07/19 1840 12/07/19 2144 12/08/19 0612  BP: 116/80 118/85 117/86 112/72  Pulse: (!) 104 97 99 93  Resp: 14 17 18 18   Temp: 99.5 F (37.5 C) 98.3 F (36.8 C) 98.3 F (36.8 C) 98.2 F (36.8 C)  TempSrc: Oral Oral Oral Oral  SpO2: 100% 100% 100% 100%  Weight:      Height:       SpO2: 100 %   Intake/Output Summary (Last 24 hours) at 12/08/2019 0959 Last data filed at 12/08/2019 0600 Gross per 24 hour  Intake 2682.75 ml  Output --  Net 2682.75 ml   Filed Weights   12/06/19 1939  Weight: 66.7 kg    Exam: General exam: In no acute distress. Respiratory system: Good air movement and clear to auscultation. Cardiovascular system: S1 & S2 heard, RRR. No JVD. Gastrointestinal system: Abdomen is nondistended, soft and nontender.  Central nervous system: Alert and oriented. No focal neurological deficits. Extremities: No pedal edema. Skin: She has like a bruise on her right lower extremity lateral tibia and above her ankle not tender warm to touch, his skin surrounding it is not erythematous.  Data Reviewed:    Labs: Basic Metabolic  Panel: Recent Labs  Lab 12/03/19 1449 12/06/19 2014  NA 140 138  K 3.9 3.5  CL 103 103  CO2 24 24  GLUCOSE 83 88  BUN 11 11  CREATININE 0.51* 0.65  CALCIUM 8.7 8.5*   GFR Estimated Creatinine Clearance: 98.6 mL/min (by C-G formula based on SCr of 0.65 mg/dL). Liver Function Tests: Recent Labs  Lab 12/06/19 2014  AST 37  ALT 17  ALKPHOS 74  BILITOT 0.5  PROT 6.9  ALBUMIN 2.6*   No results for input(s): LIPASE, AMYLASE in the last 168 hours. No results for input(s): AMMONIA in the last 168 hours. Coagulation profile No results for input(s): INR, PROTIME in the last 168 hours. COVID-19 Labs  Recent Labs    12/07/19 0224  DDIMER 15.43*    Lab Results  Component Value Date   SARSCOV2NAA NEGATIVE 12/07/2019   SARSCOV2NAA NEGATIVE 05/08/2019   SARSCOV2NAA NEGATIVE 04/19/2019   [place] NEGATIVE 03/19/2019    CBC: Recent Labs  Lab 12/03/19 1449 12/06/19 2014  WBC 3.5 4.2  NEUTROABS  --  3.6  HGB 9.2* 8.8*  HCT 28.6* 28.1*  MCV 81 84.1  PLT 174 142*   Cardiac Enzymes: No results for input(s): CKTOTAL, CKMB,  CKMBINDEX, TROPONINI in the last 168 hours. BNP (last 3 results) No results for input(s): PROBNP in the last 8760 hours. CBG: No results for input(s): GLUCAP in the last 168 hours. D-Dimer: Recent Labs    12/07/19 0224  DDIMER 15.43*   Hgb A1c: No results for input(s): HGBA1C in the last 72 hours. Lipid Profile: No results for input(s): CHOL, HDL, LDLCALC, TRIG, CHOLHDL, LDLDIRECT in the last 72 hours. Thyroid function studies: Recent Labs    12/07/19 0224  TSH 1.180   Anemia work up: No results for input(s): VITAMINB12, FOLATE, FERRITIN, TIBC, IRON, RETICCTPCT in the last 72 hours. Sepsis Labs: Recent Labs  Lab 12/03/19 1449 12/06/19 2014  WBC 3.5 4.2   Microbiology Recent Results (from the past 240 hour(s))  SARS CORONAVIRUS 2 (TAT 6-24 HRS) Nasopharyngeal Nasopharyngeal Swab     Status: None   Collection Time: 12/07/19  2:39  AM   Specimen: Nasopharyngeal Swab  Result Value Ref Range Status   SARS Coronavirus 2 NEGATIVE NEGATIVE Final    Comment: (NOTE) SARS-CoV-2 target nucleic acids are NOT DETECTED. The SARS-CoV-2 RNA is generally detectable in upper and lower respiratory specimens during the acute phase of infection. Negative results do not preclude SARS-CoV-2 infection, do not rule out co-infections with other pathogens, and should not be used as the sole basis for treatment or other patient management decisions. Negative results must be combined with clinical observations, patient history, and epidemiological information. The expected result is Negative. Fact Sheet for Patients: SugarRoll.be Fact Sheet for Healthcare Providers: https://www.woods-mathews.com/ This test is not yet approved or cleared by the [place] FDA and  has been authorized for detection and/or diagnosis of SARS-CoV-2 by FDA under an Emergency Use Authorization (EUA). This EUA will remain  in effect (meaning this test can be used) for the duration of the COVID-19 declaration under Section 56 4(b)(1) of the Act, 21 U.S.C. section 360bbb-3(b)(1), unless the authorization is terminated or revoked sooner. Performed at [facility], [place] [address] [place], [place] [address]      Medications:   . amLODipine  10 mg Oral Daily  . buPROPion  300 mg Oral Daily  . feeding supplement (ENSURE ENLIVE)  237 mL Oral BID BM  . ferrous sulfate  325 mg Oral BID  . folic acid  1 mg Oral Daily  . gabapentin  300 mg Oral TID  . lisinopril  20 mg Oral Daily  . metoprolol tartrate  25 mg Oral BID  . multivitamin with minerals  1 tablet Oral Daily  . pantoprazole  20 mg Oral QHS  . predniSONE  40 mg Oral Q breakfast  . rivaroxaban  10 mg Oral Daily  . traZODone  100 mg Oral QHS   Continuous Infusions: . sodium chloride 125 mL/hr at 12/08/19 0606      LOS: 0 days   Charlynne Cousins  [facility]  12/08/2019, 9:59 AM

## 2019-12-08 NOTE — Plan of Care (Signed)
Patient tolerating heart healthy diet, medicated for pain x 1 on 7 a to 7 p shift with improvement.  Ambulatory in room.

## 2019-12-09 ENCOUNTER — Other Ambulatory Visit: Payer: Self-pay

## 2019-12-09 ENCOUNTER — Emergency Department (HOSPITAL_COMMUNITY)
Admission: EM | Admit: 2019-12-09 | Discharge: 2019-12-10 | Disposition: A | Discharge status: Home / Self Care | Payer: Medicaid Other | Attending: Emergency Medicine | Admitting: Emergency Medicine

## 2019-12-09 DIAGNOSIS — M3210 Systemic lupus erythematosus, organ or system involvement unspecified: Secondary | ICD-10-CM | POA: Diagnosis not present

## 2019-12-09 DIAGNOSIS — M329 Systemic lupus erythematosus, unspecified: Secondary | ICD-10-CM

## 2019-12-09 DIAGNOSIS — I7300 Raynaud's syndrome without gangrene: Secondary | ICD-10-CM

## 2019-12-09 DIAGNOSIS — R52 Pain, unspecified: Secondary | ICD-10-CM | POA: Insufficient documentation

## 2019-12-09 DIAGNOSIS — I10 Essential (primary) hypertension: Secondary | ICD-10-CM

## 2019-12-09 DIAGNOSIS — Z79899 Other long term (current) drug therapy: Secondary | ICD-10-CM | POA: Insufficient documentation

## 2019-12-09 LAB — CBC WITH DIFFERENTIAL/PLATELET
Abs Immature Granulocytes: 0.1 10*3/uL — ABNORMAL HIGH (ref 0.00–0.07)
Basophils Absolute: 0 10*3/uL (ref 0.0–0.1)
Basophils Relative: 0 %
Eosinophils Absolute: 0 10*3/uL (ref 0.0–0.5)
Eosinophils Relative: 0 %
HCT: 38.1 % (ref 36.0–46.0)
Hemoglobin: 11.5 g/dL — ABNORMAL LOW (ref 12.0–15.0)
Immature Granulocytes: 2 %
Lymphocytes Relative: 6 %
Lymphs Abs: 0.3 10*3/uL — ABNORMAL LOW (ref 0.7–4.0)
MCH: 25.2 pg — ABNORMAL LOW (ref 26.0–34.0)
MCHC: 30.2 g/dL (ref 30.0–36.0)
MCV: 83.6 fL (ref 80.0–100.0)
Monocytes Absolute: 0.3 10*3/uL (ref 0.1–1.0)
Monocytes Relative: 5 %
Neutro Abs: 4.9 10*3/uL (ref 1.7–7.7)
Neutrophils Relative %: 87 %
Platelets: 207 10*3/uL (ref 150–400)
RBC: 4.56 MIL/uL (ref 3.87–5.11)
RDW: 16 % — ABNORMAL HIGH (ref 11.5–15.5)
WBC: 5.6 10*3/uL (ref 4.0–10.5)
nRBC: 0 % (ref 0.0–0.2)

## 2019-12-09 LAB — BASIC METABOLIC PANEL
Anion gap: 9 (ref 5–15)
BUN: 16 mg/dL (ref 6–20)
CO2: 24 mmol/L (ref 22–32)
Calcium: 9.4 mg/dL (ref 8.9–10.3)
Chloride: 106 mmol/L (ref 98–111)
Creatinine, Ser: 0.51 mg/dL (ref 0.44–1.00)
GFR calc Af Amer: >60 mL/min (ref >60)
GFR calc non Af Amer: >60 mL/min (ref >60)
Glucose, Bld: 107 mg/dL — ABNORMAL HIGH (ref 70–99)
Potassium: 5.1 mmol/L (ref 3.5–5.1)
Sodium: 139 mmol/L (ref 135–145)

## 2019-12-09 LAB — C4 COMPLEMENT: Complement C4, Body Fluid: 7 mg/dL — ABNORMAL LOW (ref 12–38)

## 2019-12-09 LAB — C3 COMPLEMENT: C3 Complement: 59 mg/dL — ABNORMAL LOW (ref 82–167)

## 2019-12-09 MED ORDER — SODIUM CHLORIDE 0.9 % IV BOLUS
1000.0000 mL | Freq: Once | INTRAVENOUS | Status: AC
  Administered 2019-12-09: 1000 mL via INTRAVENOUS

## 2019-12-09 MED ORDER — POTASSIUM CHLORIDE CRYS ER 20 MEQ PO TBCR
40.0000 meq | EXTENDED_RELEASE_TABLET | Freq: Three times a day (TID) | ORAL | Status: DC
  Administered 2019-12-09: 40 meq via ORAL
  Filled 2019-12-09: qty 2

## 2019-12-09 MED ORDER — PREDNISONE 20 MG PO TABS: 20.0000 mg | ORAL_TABLET | Freq: Every day | ORAL | 0 refills | Status: DC

## 2019-12-09 MED ORDER — PREDNISONE 20 MG PO TABS: 10.0000 mg | ORAL_TABLET | Freq: Every day | ORAL | 0 refills | Status: DC

## 2019-12-09 MED ORDER — MORPHINE SULFATE (PF) 4 MG/ML IV SOLN
4.0000 mg | Freq: Once | INTRAVENOUS | Status: AC
  Administered 2019-12-09: 4 mg via INTRAVENOUS
  Filled 2019-12-09: qty 1

## 2019-12-09 MED ORDER — PREDNISONE 20 MG PO TABS: 20.0000 mg | ORAL_TABLET | Freq: Every day | ORAL | Status: DC

## 2019-12-09 NOTE — Progress Notes (Signed)
Patient reported feeling better today. Walking around independently, reported no pain.  Patient was given verbal and written discharge instructions. IV's were removed.

## 2019-12-09 NOTE — ED Triage Notes (Signed)
Arrived POV from home patient discharged from this facility today. Patient reports worsening inflammation and swelling from exacerbation of lupus. Patient says she feels worse than when she was discharged.

## 2019-12-09 NOTE — ED Provider Notes (Signed)
[facility] DEPT Provider Note   CSN: [id number] Arrival date & time: 12/09/19  1918     History Chief Complaint  Patient presents with  . Lupus    [name] [name] is a 24 y.o. female.  The history is provided by the patient and medical records.    25 y.o. F with hx of cardiomegaly, chronic back pain, depression, HTN, gout, iron deficiency anemia, SLE, Raynaud's disease, arthritis, presenting to the ED with pain.  Patient was just released from the hospital today after admission for lupus flare.  This began after steroids were discontinued on 12/05/2019 due to concern of right lower extremity cellulitis.  States she is doing better from the swelling standpoint of her legs and infection seems to have cleared, however she has uncontrolled pain.  She reports she was receiving morphine while in the hospital but was not sent home with any type of pain medication.  She denies any new fevers.  She was also sent home with course of steroids to taper down again until she can see her rheumatologist.  States her upcoming appointment is not until 12/22/2019.  States she tried taking some hot showers at home to relieve pain but it did not help.  Past Medical History:  Diagnosis Date  . Accidental methotrexate overdose 01/08/2018  . Acute lower UTI   . Bronchitis 03/19/2019  . Cardiomegaly 05/08/2019  . Chronic midline low back pain without sciatica 11/09/2016   Facet joint arthropathy  . Dehydration 03/19/2019  . Depo-Provera contraceptive status 08/11/2015  . Depression 02/08/2017  . Dysphagia 06/01/2018  . Dysuria 04/19/2019  . Elevated AST (SGOT) 03/19/2019  . Essential hypertension 03/19/2019  . Facial edema   . Gout   . Hypertension   . IDA (iron deficiency anemia) 08/11/2015  . Immunosuppressed status ([place])   . Insomnia 01/29/2016  . Leukopenia 01/10/2018  . Lobar pneumonia ([place]) 06/07/2018  . Lupus ([place])   . Lupus (systemic lupus erythematosus) ([place]) 03/19/2019  .  Metabolic acidosis [id number]  . Methotrexate toxicity   . Mouth ulcers   . Mucositis oral 01/12/2018  . Odynophagia 06/01/2018  . Oral thrush 03/19/2019  . Pleuritic chest pain 06/05/2018  . Primary osteoarthritis of both knees 11/09/2016  . Rash due to systemic lupus erythematosus (SLE) ([place])   . Raynaud disease   . Raynaud's disease 08/11/2015  . Relative acute adrenal insufficiency ([place]) 06/11/2018  . Right leg DVT ([place]) 05/08/2019  . Seasonal allergies 08/11/2015  . Sepsis ([place]) 05/08/2019  . Sinus tachycardia 03/19/2019  . SLE (systemic lupus erythematosus) ([place]) 09/19/2016     Positive ANA, double-stranded DNA, Ro, RNP, RF, history of nasal ulcers, fatigue, anemia, arthritis, Raynauds  . Thrush 06/01/2018  . Vaginal candidiasis 01/12/2018  . Vitamin D deficiency 11/09/2016    Patient Active Problem List   Diagnosis Date Noted  . Exacerbation of systemic lupus erythematosus ([place]) 12/06/2019  . Pulmonary embolus ([place]) 05/28/2019  . Sepsis ([place]) 05/08/2019  . Right leg DVT ([place]) 05/08/2019  . Cardiomegaly 05/08/2019  . Dysuria 04/19/2019  . Facial edema   . Rash due to systemic lupus erythematosus (SLE) ([place])   . Immunosuppressed status ([place])   . Acute lower UTI   . 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 Lupus (systemic lupus erythematosus) ([place]) 03/19/2019  . Bronchitis 03/19/2019  . Essential hypertension 03/19/2019  . Elevated AST (SGOT) 03/19/2019  . Dehydration 03/19/2019  . Sinus tachycardia 03/19/2019  .  Metabolic acidosis 0000000  . Oral thrush 03/19/2019  . Relative acute adrenal insufficiency ([place]) 06/11/2018  . Anemia 06/09/2018  . Lobar pneumonia ([place]) 06/07/2018  . Pleuritic chest pain 06/05/2018  . Dysphagia 06/01/2018  . Odynophagia 06/01/2018  . Thrush 06/01/2018  . Mucositis oral 01/12/2018  . Vaginal candidiasis 01/12/2018  . Methotrexate toxicity   . Mouth ulcers   . Leukopenia 01/10/2018  . Accidental  methotrexate overdose 01/08/2018  . Depression 02/08/2017  . Primary osteoarthritis of both knees 11/09/2016  . Chronic midline low back pain without sciatica 11/09/2016  . Vitamin D deficiency 11/09/2016  . SLE (systemic lupus erythematosus) ([place]) 09/19/2016  . Insomnia 01/29/2016  . Depo-Provera contraceptive status 08/11/2015  . Raynaud's disease 08/11/2015  . Seasonal allergies 08/11/2015  . IDA (iron deficiency anemia) 08/11/2015    Past Surgical History:  Procedure Laterality Date  . ORIF SHOULDER FRACTURE Left   . TONSILLECTOMY       OB History   No obstetric history on file.     Family History  Problem Relation Age of Onset  . Aneurysm Mother   . Stroke Sister   . Seizures Sister   . Lung cancer Maternal Aunt   . Heart attack Paternal Uncle   . Colon cancer Neg Hx   . Esophageal cancer Neg Hx   . Pancreatic cancer Neg Hx   . Stomach cancer Neg Hx     Social History   Tobacco Use  . Smoking status: Never Smoker  . Smokeless tobacco: Never Used  Substance Use Topics  . Alcohol use: No  . Drug use: No    Home Medications Prior to Admission medications   Medication Sig Start Date End Date Taking? Authorizing Provider  acetaminophen-codeine (TYLENOL #3) 300-30 MG tablet Take 1 tablet by mouth every 6 (six) hours as needed for moderate pain. 12/03/19   Dorena Dew, FNP  amLODipine (NORVASC) 10 MG tablet Take 1 tablet (10 mg total) by mouth daily. 03/26/19   Kayleen Memos, DO  buPROPion (WELLBUTRIN XL) 300 MG 24 hr tablet Take 1 tablet (300 mg total) by mouth daily. 12/03/19   Dorena Dew, FNP  diphenhydrAMINE (BENADRYL) 25 MG tablet Take 25 mg by mouth every 6 (six) hours as needed for allergies.    [provider]  ferrous sulfate 325 (65 FE) MG tablet Take 1 tablet (325 mg total) by mouth 2 (two) times daily. With 1/2 glass of orange juice 12/15/15   Davonna Belling, MD  folic acid (FOLVITE) 1 MG tablet Take 1 tablet (1 mg total) by mouth  at bedtime. Patient taking differently: Take 1 mg by mouth daily.  03/25/19   Kayleen Memos, DO  gabapentin (NEURONTIN) 300 MG capsule Take 1 capsule (300 mg total) by mouth 3 (three) times daily. 04/10/19   [name], FNP  lisinopril (ZESTRIL) 20 MG tablet Take 20 mg by mouth daily. 06/18/19   [provider]  loperamide (IMODIUM) 2 MG capsule Take 1 capsule (2 mg total) by mouth as needed for diarrhea or loose stools. 03/25/19   Kayleen Memos, DO  metoprolol tartrate (LOPRESSOR) 25 MG tablet Take 1 tablet (25 mg total) by mouth 2 (two) times daily. 05/11/19   Elodia Florence., MD  Multiple Vitamins-Minerals (MULTIVITAMIN ADULT PO) Take 1 tablet by mouth daily.    [provider]  mycophenolate (CELLCEPT) 250 MG capsule Take 500 mg by mouth 2 (two) times daily. 11/27/19   [provider]  pantoprazole (PROTONIX) 20 MG tablet TAKE 1 TABLET BY MOUTH EVERYDAY AT BEDTIME*NOT COVERED** Patient taking differently: Take 20 mg by mouth at bedtime.  11/06/19 12/06/19  Vevelyn Francois, NP  predniSONE (DELTASONE) 20 MG tablet Take 0.5 tablets (10 mg total) by mouth daily with breakfast. 12/10/19   Charlynne Cousins, MD  rivaroxaban (XARELTO) 10 MG TABS tablet Take 1 tablet (10 mg total) by mouth daily with supper. Patient taking differently: Take 10 mg by mouth daily.  11/07/19   [place], Holli Humbles, NP  tacrolimus (PROGRAF) 1 MG capsule Take 1 mg by mouth 2 (two) times daily. 07/25/19   [provider]  traZODone (DESYREL) 100 MG tablet Take 1 tablet (100 mg total) by mouth at bedtime. 12/03/19   Dorena Dew, FNP  Vitamin D, Ergocalciferol, (DRISDOL) 50000 units CAPS capsule Take 1 capsule (50,000 Units total) by mouth 2 (two) times a week. 12/21/17   Bo Merino, MD    Allergies    Hydroxychloroquine and Latex  Review of Systems   Review of Systems  Musculoskeletal: Positive for arthralgias and myalgias.  All other systems reviewed and are  negative.   Physical Exam Updated Vital Signs BP (!) 147/106   Pulse (!) 103   Temp (!) 97.5 F (36.4 C) (Oral)   Resp 16   LMP 11/14/2019 Comment: neg hcg on 12/06/2019  SpO2 100%   Physical Exam Vitals and nursing note reviewed.  Constitutional:      Appearance: She is well-developed.     Comments: Crying but in NAD  HENT:     Head: Normocephalic and atraumatic.  Eyes:     Conjunctiva/sclera: Conjunctivae normal.     Pupils: Pupils are equal, round, and reactive to light.  Cardiovascular:     Rate and Rhythm: Normal rate and regular rhythm.     Heart sounds: Normal heart sounds.  Pulmonary:     Effort: Pulmonary effort is normal.     Breath sounds: Normal breath sounds.  Abdominal:     General: Bowel sounds are normal.     Palpations: Abdomen is soft.  Musculoskeletal:        General: Normal range of motion.     Cervical back: Normal range of motion.     Comments: Scabbed area noted to right lateral calf, appears unchanged compared with prior pictures from hospitalization  Skin:    General: Skin is warm and dry.  Neurological:     Mental Status: She is alert and oriented to person, place, and time.     ED Results / Procedures / Treatments   Labs (all labs ordered are listed, but only abnormal results are displayed) Labs Reviewed  CBC WITH DIFFERENTIAL/PLATELET - Abnormal; Notable for the following components:      Result Value   Hemoglobin 11.5 (*)    MCH 25.2 (*)    RDW 16.0 (*)    Lymphs Abs 0.3 (*)    Abs Immature Granulocytes 0.10 (*)    All other components within normal limits  BASIC METABOLIC PANEL - Abnormal; Notable for the following components:   Glucose, Bld 107 (*)    All other components within normal limits    EKG None  Radiology No results found.  Procedures Procedures (including critical care time)  Medications Ordered in ED Medications - No data to display  ED Course  I have reviewed the triage vital signs and the nursing  notes.  Pertinent labs & imaging results that were available during my  care of the patient were reviewed by me and considered in my medical decision making (see chart for details).    MDM Rules/Calculators/A&P  24 y.o. F here with generalized pain.  Patient was just discharged from this facility earlier this morning after admission for lupus flare.  She was discharged home with steroids but no pain medication.  She is afebrile, non-toxic.  She is crying on exam but in NAD.  VSS.  She has not had any new infectious symptoms, sounds more like her pain is just uncontrolled.  Will check screening labs, give IVF and pain control.  12:05 AM Patient reassessed, states pain in her legs has improved.  She has been ambulating to and from the bathroom here independently without any apparent issue.  Her vitals remained stable.  Repeat labs today are reassuring.  At this point, I do not see any indication for repeat admission at this time.  She should continue her prednisone taper.  States she does not currently have any home pain medication.  Last prescription was 12/03/2019 for tylenol #3 from PCP.  Given few tablets for pain control, if needing further medications she will need to talk to her PCP.  Return here for any new concerns.  Final Clinical Impression(s) / ED Diagnoses Final diagnoses:  Lupus ([place])  Generalized pain    Rx / DC Orders ED Discharge Orders         Ordered    acetaminophen-codeine (TYLENOL #3) 300-30 MG tablet  Every 6 hours PRN     12/10/19 0007           Larene Pickett, PA-C 12/10/19 0010    Varney Biles, MD 12/10/19 [phone]

## 2019-12-09 NOTE — Discharge Summary (Signed)
Physician Discharge Summary  [name] [name] [id number] DOB: Sep 11, 1996 DOA: 12/06/2019  PCP: Default, Provider, MD  Admit date: 12/06/2019 Discharge date: 12/09/2019  Admitted From: Home Disposition:  Home  Recommendations for Outpatient Follow-up:  1. Follow up with PCP in 1-2 weeks 2. Please obtain BMP/CBC in one week   Home Health:No Equipment/Devices:None  Discharge Condition:Stable CODE STATUS:Full Diet recommendation: Heart Healthy  Brief/Interim Summary: 24 y.o. female past medical history of lupus nephritis PE and right lower extremity DVT in September 2020 on Xarelto, depression and hypertension presents with bilateral lower extremity swelling, she has been recently tapered off her steroids, but for about 1 week she has had bilateral lower extremity pain.  Discharge Diagnoses:  Principal Problem:   Exacerbation of systemic lupus erythematosus ([place]) Active Problems:   Raynaud's disease   Depression   Essential hypertension   Right leg DVT ([place])  Questionable exacerbation of SLE: She was recently taper off her steroids by her rheumatologist on 12/05/2019 due to concern of right lower extremity cellulitis she completed course of antibiotics. Complements level are low C3 and C4, she will follow up with her rheumatology as an outpatient. She was started on high-dose steroids on admission and they have been weaned down she will follow up with rheumatology as an outpatient.  Questionable adrenal insufficiency: She relates she was taper off her steroids on 12/05/2019, since then she has been feeling better she was started on a significant dose of IV steroids in the hospital she has been weaned to oral steroids at a lower dose. She will follow up with rheumatology as an outpatient will probably need to taper slower her steroids as an outpatient.  Sinus tachycardia: Continue metoprolol no signs of thromboembolic events, there is resolved.  Right lower extremity scab or  lesion: Patient was treated with Augmentin which she completed her course for lower extremity cellulitis on physical exam there is not edematous not warm to touch and not tender she is afebrile with no leukocytosis no antibiotics were started for this.  Essential hypertension: No changes were made to her medication.  Discharge Instructions  Discharge Instructions    Diet - low sodium heart healthy   Complete by: As directed    Increase activity slowly   Complete by: As directed      Allergies as of 12/09/2019      Reactions   Hydroxychloroquine Rash   Latex Rash      Medication List    STOP taking these medications   amoxicillin-clavulanate 875-125 MG tablet Commonly known as: Augmentin   cyclobenzaprine 10 MG tablet Commonly known as: FLEXERIL   mupirocin ointment 2 % Commonly known as: BACTROBAN     TAKE these medications   acetaminophen-codeine 300-30 MG tablet Commonly known as: TYLENOL #3 Take 1 tablet by mouth every 6 (six) hours as needed for moderate pain.   amLODipine 10 MG tablet Commonly known as: NORVASC Take 1 tablet (10 mg total) by mouth daily.   buPROPion 300 MG 24 hr tablet Commonly known as: WELLBUTRIN XL Take 1 tablet (300 mg total) by mouth daily.   diphenhydrAMINE 25 MG tablet Commonly known as: BENADRYL Take 25 mg by mouth every 6 (six) hours as needed for allergies.   ferrous sulfate 325 (65 FE) MG tablet Take 1 tablet (325 mg total) by mouth 2 (two) times daily. With 1/2 glass of orange juice   folic acid 1 MG tablet Commonly known as: FOLVITE Take 1 tablet (1 mg total) by mouth at  bedtime. What changed: when to take this   gabapentin 300 MG capsule Commonly known as: NEURONTIN Take 1 capsule (300 mg total) by mouth 3 (three) times daily.   lisinopril 20 MG tablet Commonly known as: ZESTRIL Take 20 mg by mouth daily.   loperamide 2 MG capsule Commonly known as: IMODIUM Take 1 capsule (2 mg total) by mouth as needed for  diarrhea or loose stools.   metoprolol tartrate 25 MG tablet Commonly known as: LOPRESSOR Take 1 tablet (25 mg total) by mouth 2 (two) times daily.   MULTIVITAMIN ADULT PO Take 1 tablet by mouth daily.   mycophenolate 250 MG capsule Commonly known as: CELLCEPT Take 500 mg by mouth 2 (two) times daily.   pantoprazole 20 MG tablet Commonly known as: PROTONIX TAKE 1 TABLET BY MOUTH EVERYDAY AT BEDTIME*NOT COVERED** What changed: See the new instructions.   predniSONE 20 MG tablet Commonly known as: DELTASONE Take 1 tablet (20 mg total) by mouth daily with breakfast. Start taking on: December 10, 2019   rivaroxaban 10 MG Tabs tablet Commonly known as: Xarelto Take 1 tablet (10 mg total) by mouth daily with supper. What changed: when to take this   tacrolimus 1 MG capsule Commonly known as: PROGRAF Take 1 mg by mouth 2 (two) times daily.   traZODone 100 MG tablet Commonly known as: DESYREL Take 1 tablet (100 mg total) by mouth at bedtime.   Vitamin D (Ergocalciferol) 1.25 MG (50000 UNIT) Caps capsule Commonly known as: DRISDOL Take 1 capsule (50,000 Units total) by mouth 2 (two) times a week.       Allergies  Allergen Reactions  . Hydroxychloroquine Rash  . Latex Rash    Consultations:  None   Procedures/Studies: CT ANGIO CHEST PE W OR WO CONTRAST  Result Date: 12/07/2019 CLINICAL DATA:  Lupus nephritis, right lower extremity DVT September 2020. Elevated D-dimer. EXAM: CT ANGIOGRAPHY CHEST WITH CONTRAST TECHNIQUE: Multidetector CT imaging of the chest was performed using the standard protocol during bolus administration of intravenous contrast. Multiplanar CT image reconstructions and MIPs were obtained to evaluate the vascular anatomy. CONTRAST:  173mL OMNIPAQUE IOHEXOL 350 MG/ML SOLN COMPARISON:  11/24/2018 FINDINGS: Cardiovascular: Satisfactory opacification of the pulmonary arteries to the segmental level. No evidence of pulmonary embolism. Normal heart size. No  pericardial effusion. Mediastinum/Nodes: No enlarged mediastinal, hilar, or axillary lymph nodes. Thyroid gland, trachea, and esophagus demonstrate no significant findings. Lungs/Pleura: No focal consolidation. Mild bibasilar atelectasis. No pleural effusion or pneumothorax. Upper Abdomen: No acute abnormality. Musculoskeletal: No acute osseous abnormality. No aggressive osseous lesion. Review of the MIP images confirms the above findings. IMPRESSION: 1. No evidence of pulmonary embolus. 2. No acute cardiopulmonary disease. Electronically Signed   By: Kathreen Devoid   On: 12/07/2019 06:47   CT Angio Chest PE W and/or Wo Contrast  Result Date: 11/24/2019 CLINICAL DATA:  Shortness of breath. History of DVT and PEG. EXAM: CT ANGIOGRAPHY CHEST WITH CONTRAST TECHNIQUE: Multidetector CT imaging of the chest was performed using the standard protocol during bolus administration of intravenous contrast. Multiplanar CT image reconstructions and MIPs were obtained to evaluate the vascular anatomy. CONTRAST:  161mL OMNIPAQUE IOHEXOL 350 MG/ML SOLN COMPARISON:  CTA chest 05/08/2019 FINDINGS: Cardiovascular: Contrast injection is sufficient to demonstrate satisfactory opacification of the pulmonary arteries to the segmental level. There is no pulmonary embolus or evidence of right heart strain. The size of the main pulmonary artery is normal. Heart size is normal, with no pericardial effusion. The course and caliber  of the aorta are normal. There is no atherosclerotic calcification. Opacification decreased due to pulmonary arterial phase contrast bolus timing. Mediastinum/Nodes: No mediastinal, hilar or axillary lymphadenopathy. Normal visualized thyroid. Thoracic esophageal course is normal. Lungs/Pleura: Airways are patent. No pleural effusion, lobar consolidation, pneumothorax or pulmonary infarction. Upper Abdomen: Contrast bolus timing is not optimized for evaluation of the abdominal organs. The visualized portions of the  organs of the upper abdomen are normal. Musculoskeletal: No chest wall abnormality. No bony spinal canal stenosis. Review of the MIP images confirms the above findings. IMPRESSION: 1. No pulmonary embolus. 2. No acute thoracic abnormality. Electronically Signed   By: Ulyses Jarred M.D.   On: 11/24/2019 03:56   DG Chest Port 1 View  Result Date: 12/06/2019 CLINICAL DATA:  Shortness of breath EXAM: PORTABLE CHEST 1 VIEW COMPARISON:  April 19, 2019 FINDINGS: The heart size and mediastinal contours are within normal limits. Both lungs are clear. The visualized skeletal structures are unremarkable. IMPRESSION: No active disease. Electronically Signed   By: Constance Holster M.D.   On: 12/06/2019 21:41   LE VENOUS  Result Date: 11/24/2019  Lower Venous DVTStudy Indications: Pain.  Anticoagulation: Xarelto. Comparison Study: Prior study done 05/08/19 is on file for comparison Performing Technologist: Sharion Dove RVS  Examination Guidelines: A complete evaluation includes B-mode imaging, spectral Doppler, color Doppler, and power Doppler as needed of all accessible portions of each vessel. Bilateral testing is considered an integral part of a complete examination. Limited examinations for reoccurring indications may be performed as noted. The reflux portion of the exam is performed with the patient in reverse Trendelenburg.  +---------+---------------+---------+-----------+----------+--------------+ RIGHT    CompressibilityPhasicitySpontaneityPropertiesThrombus Aging +---------+---------------+---------+-----------+----------+--------------+ CFV      Full           Yes      Yes                                 +---------+---------------+---------+-----------+----------+--------------+ SFJ      Full                                                        +---------+---------------+---------+-----------+----------+--------------+ FV Prox  Full                                                         +---------+---------------+---------+-----------+----------+--------------+ FV Mid   Full                                                        +---------+---------------+---------+-----------+----------+--------------+ FV DistalFull                                                        +---------+---------------+---------+-----------+----------+--------------+ PFV      Full                                                        +---------+---------------+---------+-----------+----------+--------------+  POP      Full           Yes      Yes                                 +---------+---------------+---------+-----------+----------+--------------+ PTV      Full                                                        +---------+---------------+---------+-----------+----------+--------------+ PERO     Full                                                        +---------+---------------+---------+-----------+----------+--------------+   +----+---------------+---------+-----------+----------+--------------+ LEFTCompressibilityPhasicitySpontaneityPropertiesThrombus Aging +----+---------------+---------+-----------+----------+--------------+ CFV Full           Yes      Yes                                 +----+---------------+---------+-----------+----------+--------------+     Summary: RIGHT: - Findings appear improved from previous examination. - There is no evidence of deep vein thrombosis in the lower extremity.  - Ultrasound characteristics of enlarged lymph nodes are noted in the groin.  LEFT: - No evidence of common femoral vein obstruction.  *See table(s) above for measurements and observations. Electronically signed by Harold Barban MD on 11/24/2019 at 4:19:04 PM.    Final      Subjective: No new complaints feels great.  Discharge Exam: Vitals:   12/08/19 1957 12/09/19 0610  BP: 125/89 (!) 123/96  Pulse: (!) 101 91  Resp: 16 18  Temp:  98.7 F (37.1 C) 98.3 F (36.8 C)  SpO2: 99% 100%   Vitals:   12/08/19 0612 12/08/19 1514 12/08/19 1957 12/09/19 0610  BP: 112/72 105/78 125/89 (!) 123/96  Pulse: 93 92 (!) 101 91  Resp: 18  16 18   Temp: 98.2 F (36.8 C) 98.7 F (37.1 C) 98.7 F (37.1 C) 98.3 F (36.8 C)  TempSrc: Oral Oral Oral Oral  SpO2: 100% 100% 99% 100%  Weight:      Height:        General: Pt is alert, awake, not in acute distress Cardiovascular: RRR, S1/S2 +, no rubs, no gallops Respiratory: CTA bilaterally, no wheezing, no rhonchi Abdominal: Soft, NT, ND, bowel sounds + Extremities: no edema, no cyanosis    The results of significant diagnostics from this hospitalization (including imaging, microbiology, ancillary and laboratory) are listed below for reference.     Microbiology: Recent Results (from the past 240 hour(s))  SARS CORONAVIRUS 2 (TAT 6-24 HRS) Nasopharyngeal Nasopharyngeal Swab     Status: None   Collection Time: 12/07/19  2:39 AM   Specimen: Nasopharyngeal Swab  Result Value Ref Range Status   SARS Coronavirus 2 NEGATIVE NEGATIVE Final    Comment: (NOTE) SARS-CoV-2 target nucleic acids are NOT DETECTED. The SARS-CoV-2 RNA is generally detectable in upper and lower respiratory specimens during the acute phase of infection. Negative results do not preclude SARS-CoV-2 infection, do not rule out co-infections with other pathogens, and should  not be used as the sole basis for treatment or other patient management decisions. Negative results must be combined with clinical observations, patient history, and epidemiological information. The expected result is Negative. Fact Sheet for Patients: SugarRoll.be Fact Sheet for Healthcare Providers: https://www.woods-mathews.com/ This test is not yet approved or cleared by the [place] FDA and  has been authorized for detection and/or diagnosis of SARS-CoV-2 by FDA under an Emergency Use  Authorization (EUA). This EUA will remain  in effect (meaning this test can be used) for the duration of the COVID-19 declaration under Section 56 4(b)(1) of the Act, 21 U.S.C. section 360bbb-3(b)(1), unless the authorization is terminated or revoked sooner. Performed at [facility], [place] [address], [place],  [address]      Labs: BNP (last 3 results) No results for input(s): BNP in the last 8760 hours. Basic Metabolic Panel: Recent Labs  Lab 12/03/19 1449 12/06/19 2014  NA 140 138  K 3.9 3.5  CL 103 103  CO2 24 24  GLUCOSE 83 88  BUN 11 11  CREATININE 0.51* 0.65  CALCIUM 8.7 8.5*   Liver Function Tests: Recent Labs  Lab 12/06/19 2014  AST 37  ALT 17  ALKPHOS 74  BILITOT 0.5  PROT 6.9  ALBUMIN 2.6*   No results for input(s): LIPASE, AMYLASE in the last 168 hours. No results for input(s): AMMONIA in the last 168 hours. CBC: Recent Labs  Lab 12/03/19 1449 12/06/19 2014  WBC 3.5 4.2  NEUTROABS  --  3.6  HGB 9.2* 8.8*  HCT 28.6* 28.1*  MCV 81 84.1  PLT 174 142*   Cardiac Enzymes: No results for input(s): CKTOTAL, CKMB, CKMBINDEX, TROPONINI in the last 168 hours. BNP: Invalid input(s): POCBNP CBG: No results for input(s): GLUCAP in the last 168 hours. D-Dimer Recent Labs    12/07/19 0224  DDIMER 15.43*   Hgb A1c No results for input(s): HGBA1C in the last 72 hours. Lipid Profile No results for input(s): CHOL, HDL, LDLCALC, TRIG, CHOLHDL, LDLDIRECT in the last 72 hours. Thyroid function studies Recent Labs    12/07/19 0224  TSH 1.180   Anemia work up No results for input(s): VITAMINB12, FOLATE, FERRITIN, TIBC, IRON, RETICCTPCT in the last 72 hours. Urinalysis    Component Value Date/Time   COLORURINE YELLOW 12/06/2019 2014   APPEARANCEUR CLEAR 12/06/2019 2014   LABSPEC 1.016 12/06/2019 2014   PHURINE 6.0 12/06/2019 2014   GLUCOSEU NEGATIVE 12/06/2019 2014   HGBUR SMALL (A) 12/06/2019 2014   BILIRUBINUR NEGATIVE 12/06/2019  2014   BILIRUBINUR neg 12/03/2019 1542   KETONESUR 5 (A) 12/06/2019 2014   PROTEINUR >=300 (A) 12/06/2019 2014   UROBILINOGEN 0.2 12/03/2019 1542   NITRITE NEGATIVE 12/06/2019 2014   LEUKOCYTESUR NEGATIVE 12/06/2019 2014   Sepsis Labs Invalid input(s): PROCALCITONIN,  WBC,  LACTICIDVEN Microbiology Recent Results (from the past 240 hour(s))  SARS CORONAVIRUS 2 (TAT 6-24 HRS) Nasopharyngeal Nasopharyngeal Swab     Status: None   Collection Time: 12/07/19  2:39 AM   Specimen: Nasopharyngeal Swab  Result Value Ref Range Status   SARS Coronavirus 2 NEGATIVE NEGATIVE Final    Comment: (NOTE) SARS-CoV-2 target nucleic acids are NOT DETECTED. The SARS-CoV-2 RNA is generally detectable in upper and lower respiratory specimens during the acute phase of infection. Negative results do not preclude SARS-CoV-2 infection, do not rule out co-infections with other pathogens, and should not be used as the sole basis for treatment or other patient management decisions. Negative results must  be combined with clinical observations, patient history, and epidemiological information. The expected result is Negative. Fact Sheet for Patients: SugarRoll.be Fact Sheet for Healthcare Providers: https://www.woods-mathews.com/ This test is not yet approved or cleared by the [place] FDA and  has been authorized for detection and/or diagnosis of SARS-CoV-2 by FDA under an Emergency Use Authorization (EUA). This EUA will remain  in effect (meaning this test can be used) for the duration of the COVID-19 declaration under Section 56 4(b)(1) of the Act, 21 U.S.C. section 360bbb-3(b)(1), unless the authorization is terminated or revoked sooner. Performed at [facility], [place] [address], [place], [place] [address]      Time coordinating discharge: Over 40 minutes  SIGNED:   Charlynne Cousins, MD  [facility] 12/09/2019, 10:24 AM Pager   If  7PM-7AM, please contact night-coverage [email or web address] Password [email or web address]

## 2019-12-10 MED ORDER — ACETAMINOPHEN-CODEINE #3 300-30 MG PO TABS: 1.0000 | ORAL_TABLET | Freq: Four times a day (QID) | ORAL | 0 refills | Status: DC | PRN

## 2019-12-10 NOTE — Discharge Instructions (Signed)
Continue your prednisone.  Given script for pain meds here, I recommend to use sparingly. Follow-up with your primary care doctor and your rheumatologist. Return here for new concerns.

## 2019-12-11 ENCOUNTER — Encounter: Payer: Self-pay | Admitting: Nurse Practitioner

## 2019-12-11 ENCOUNTER — Ambulatory Visit (INDEPENDENT_AMBULATORY_CARE_PROVIDER_SITE_OTHER): Payer: Self-pay | Admitting: Nurse Practitioner

## 2019-12-11 ENCOUNTER — Other Ambulatory Visit: Payer: Self-pay

## 2019-12-11 VITALS — BP 104/76 | HR 130 | Temp 98.9°F | Resp 14 | Ht 63.0 in | Wt 139.0 lb

## 2019-12-11 DIAGNOSIS — M329 Systemic lupus erythematosus, unspecified: Secondary | ICD-10-CM

## 2019-12-11 DIAGNOSIS — I10 Essential (primary) hypertension: Secondary | ICD-10-CM

## 2019-12-11 DIAGNOSIS — R0781 Pleurodynia: Secondary | ICD-10-CM

## 2019-12-11 MED ORDER — PREDNISONE 20 MG PO TABS: 20.0000 mg | ORAL_TABLET | Freq: Two times a day (BID) | ORAL | 0 refills | Status: AC

## 2019-12-11 NOTE — Patient Instructions (Signed)
Raynaud Phenomenon  Raynaud phenomenon is a condition that affects the blood vessels (arteries) that carry blood to your fingers and toes. The arteries that supply blood to your ears, lips, nipples, or the tip of your nose might also be affected. Raynaud phenomenon causes the arteries to become narrow temporarily (spasm). As a result, the flow of blood to the affected areas is temporarily decreased. This usually occurs in response to cold temperatures or stress. During an attack, the skin in the affected areas turns white, then blue, and finally red. You may also feel tingling or numbness in those areas. Attacks usually last for only a brief period, and then the blood flow to the area returns to normal. In most cases, Raynaud phenomenon does not cause serious health problems. What are the causes? In many cases, the cause of this condition is not known. The condition may occur on its own (primary Raynaud phenomenon) or may be associated with other diseases or factors (secondary Raynaud phenomenon). Possible causes may include:  Diseases or medical conditions that damage the arteries.  Injuries and repetitive actions that hurt the hands or feet.  Being exposed to certain chemicals.  Taking medicines that narrow the arteries.  Other medical conditions, such as lupus, scleroderma, rheumatoid arthritis, thyroid problems, blood disorders, Sjogren syndrome, or atherosclerosis. What increases the risk? The following factors may make you more likely to develop this condition:  Being 20-40 years old.  Being female.  Having a family history of Raynaud phenomenon.  Living in a cold climate.  Smoking. What are the signs or symptoms? Symptoms of this condition usually occur when you are exposed to cold temperatures or when you have emotional stress. The symptoms may last for a few minutes or up to several hours. They usually affect your fingers but may also affect your toes, nipples, lips, ears, or  the tip of your nose. Symptoms may include:  Changes in skin color. The skin in the affected areas will turn pale or white. The skin may then change from white to bluish to red as normal blood flow returns to the area.  Numbness, tingling, or pain in the affected areas. In severe cases, symptoms may include:  Skin sores.  Tissues decaying and dying (gangrene). How is this diagnosed? This condition may be diagnosed based on:  Your symptoms and medical history.  A physical exam. During the exam, you may be asked to put your hands in cold water to check for a reaction to cold temperature.  Tests, such as: ? Blood tests to check for other diseases or conditions. ? A test to check the movement of blood through your arteries and veins (vascular ultrasound). ? A test in which the skin at the base of your fingernail is examined under a microscope (nailfold capillaroscopy). How is this treated? Treatment for this condition often involves making lifestyle changes and taking steps to control your exposure to cold temperatures. For more severe cases, medicine (calcium channel blockers) may be used to improve blood flow. Surgery is sometimes done to block the nerves that control the affected arteries, but this is rare. Follow these instructions at home: Avoiding cold temperatures Take these steps to avoid exposure to cold:  If possible, stay indoors during cold weather.  When you go outside during cold weather, dress in layers and wear mittens, a hat, a scarf, and warm footwear.  Wear mittens or gloves when handling ice or frozen food.  Use holders for glasses or cans containing cold drinks.    Let warm water run for a while before taking a shower or bath.  Warm up the car before driving in cold weather. Lifestyle   If possible, avoid stressful and emotional situations. Try to find ways to manage your stress, such as: ? Exercise. ? Yoga. ? Meditation. ? Biofeedback.  Do not use any  products that contain nicotine or tobacco, such as cigarettes and e-cigarettes. If you need help quitting, ask your health care provider.  Avoid secondhand smoke.  Limit your use of caffeine. ? Switch to decaffeinated coffee, tea, and soda. ? Avoid chocolate.  Avoid vibrating tools and machinery. General instructions  Protect your hands and feet from injuries, cuts, or bruises.  Avoid wearing tight rings or wristbands.  Wear loose fitting socks and comfortable, roomy shoes.  Take over-the-counter and prescription medicines only as told by your health care provider. Contact a health care provider if:  Your discomfort becomes worse despite lifestyle changes.  You develop sores on your fingers or toes that do not heal.  Your fingers or toes turn black.  You have breaks in the skin on your fingers or toes.  You have a fever.  You have pain or swelling in your joints.  You have a rash.  Your symptoms occur on only one side of your body. Summary  Raynaud phenomenon is a condition that affects the arteries that carry blood to your fingers, toes, ears, lips, nipples, or the tip of your nose.  In many cases, the cause of this condition is not known.  Symptoms of this condition include changes in skin color, and numbness and tingling of the affected area.  Treatment for this condition includes lifestyle changes, reducing exposure to cold temperatures, and using medicines for severe cases of the condition.  Contact your health care provider if your condition worsens despite treatment. This information is not intended to replace advice given to you by your health care provider. Make sure you discuss any questions you have with your health care provider. Document Revised: 09/01/2017 Document Reviewed: 10/10/2016 Elsevier Patient Education  [place].  Systemic Lupus Erythematosus, Adult Systemic lupus erythematosus (SLE) is a long-term (chronic) disease that can affect  many parts of the body. SLE is an autoimmune disease. With this type of disease, the body's defense system (immune system) mistakenly attacks healthy tissues. This can cause damage to the skin, joints, blood vessels, brain, kidneys, lungs, heart, and other internal organs. It causes pain, irritation, and inflammation. What are the causes? The cause of this condition is not known. What increases the risk? The following factors may make you more likely to develop this condition:  Being female.  Being of Asian, Hispanic, or African-American descent.  Having a family history of the condition.  Being exposed to tobacco smoke or smoking cigarettes.  Having an infection with a virus, such as Epstein-Barr virus.  Having a history of exposure to silica dust, metals, chemicals, mold or mildew, or insecticides.  Using oral contraceptives or hormone replacement therapy. What are the signs or symptoms? This condition can affect almost any organ or system in the body. Symptoms of the condition depend on which organ or system is affected. The most common symptoms include:  Fever.  Fatigue.  Weight loss.  Muscle aches.  Joint pain.  Skin rashes, especially over the nose and cheeks (butterfly rash) and after sun exposure. Symptoms can come and go. A period of time when symptoms get worse or come back is called a flare. A period  of time with no symptoms is called a remission. How is this diagnosed? This condition is diagnosed based on:  Your symptoms.  Your medical history.  A physical exam. You may also have tests, including:  Blood tests.  Urine tests.  A chest X-ray. You may be referred to an autoimmune disease specialist (rheumatologist). How is this treated? There is no cure for this condition, but treatment can help to control symptoms, prevent flares (keep symptoms in remission), and prevent damage to the heart, lungs, kidneys, and other organs. Treatment will depend on what  symptoms you are having and what organs or systems are affected. Treatment may involve taking a combination of medicines over time. Common medicines used to treat this condition include:  Antimalarial medicines to control symptoms, prevent flares, and protect against organ damage.  Corticosteroids and NSAIDs to reduce inflammation.  Medicines to weaken your immune system (immunosuppressants).  Biologic response modifiers to reduce inflammation and damage. Follow these instructions at home: Eating and drinking  Eat a heart-healthy diet. This may include: ? Eating high-fiber foods, such as fresh fruits and vegetables, whole grains, and beans. ? Eating heart-healthy fats (omega-3 fats), such as fish, flaxseed, and flaxseed oil. ? Limiting foods that are high in saturated fat and cholesterol, such as processed and fried foods, fatty meat, and full-fat dairy. ? Limiting how much salt (sodium) you eat.  Include calcium and vitamin D in your diet. Good sources of calcium and vitamin D include: ? Low-fat dairy products such as milk, yogurt, and cheese. ? Certain fish, such as fresh or canned salmon, tuna, and sardines. ? Products that have calcium and vitamin D added to them (fortified products), such as fortified cereals or juice. Medicines  Take over-the-counter and prescription medicines only as told by your health care provider.  Do not take any medicines that contain estrogen without first checking with your health care provider. Estrogen can trigger flares and may increase your risk for blood clots. Lifestyle      Stay active, as directed by your health care provider.  Do not use any products that contain nicotine or tobacco, such as cigarettes and e-cigarettes. If you need help quitting, ask your health care provider.  Protect your skin from the sun by applying sunblock and wearing protective hats and clothing.  Learn as much as you can about your condition and have a good  support system in place. Support may come from family, friends, or a lupus support group. General instructions  Work closely with all of your health care providers to manage your condition.  Stay up to date on all vaccines as directed by your health care provider.  Keep all follow-up visits as told by your health care provider. This is important. Contact a health care provider if:  You have a fever.  Your symptoms flare.  You develop new symptoms.  You have bloody, foamy, or coffee-colored urine.  There are changes in your urination. For example, you urinate more often at night.  You think that you may be depressed or have anxiety.  You become pregnant or plan to become pregnant. Pregnancy in women with this condition is considered high risk. Get help right away if:  You have chest pain.  You have trouble breathing.  You have a seizure.  You suddenly get a very bad headache.  You suddenly develop facial or body weakness.  You cannot speak.  You cannot understand speech. These symptoms may represent a serious problem that is an emergency.  Do not wait to see if the symptoms will go away. Get medical help right away. Call your local emergency services (911 in the [place] Do not drive yourself to the hospital. Summary  Systemic lupus erythematosus (SLE) is a long-term disease that can affect many parts of the body.  SLE is an autoimmune disease. That means your body's defense system (immune system) mistakenly attacks healthy tissues.  There is no cure for this condition, but treatment can help to control symptoms, prevent flares, and prevent damage to your organs. Treatment may involve taking a combination of medicines over time. This information is not intended to replace advice given to you by your health care provider. Make sure you discuss any questions you have with your health care provider. Document Revised: 10/12/2017 Document Reviewed: 10/06/2017 Elsevier Patient  Education  2020 [employer].

## 2019-12-11 NOTE — Progress Notes (Signed)
[place], [place]  [address] Phone:  [phone]   Fax:  [phone]   Acute Office Visit  Subjective:    Patient ID: [name] [name], female    DOB: [birth year], 24 y.o.   MRN: [id number]  Chief Complaint  Patient presents with  . Edema    legs, hands, knees   . Back Pain    x 2 weeks     HPI    [name] [name] is a 24 y.o. female who has SLE. She showed up this morning with an apt. She  has a past medical history of Accidental methotrexate overdose (01/08/2018), Acute lower UTI, Bronchitis (03/19/2019), Cardiomegaly (05/08/2019), Chronic midline low back pain without sciatica (11/09/2016), Dehydration (03/19/2019), Depo-Provera contraceptive status (08/11/2015), Depression (02/08/2017), Dysphagia (06/01/2018), Dysuria (04/19/2019), Elevated AST (SGOT) (03/19/2019), Essential hypertension (03/19/2019), Facial edema, Gout, Hypertension, IDA (iron deficiency anemia) (08/11/2015), Immunosuppressed status ([place]), Insomnia (01/29/2016), Leukopenia (01/10/2018), Lobar pneumonia ([place]) (06/07/2018), Lupus ([place]), Lupus (systemic lupus erythematosus) ([place]) ([id number]), Metabolic acidosis ([id number]), Methotrexate toxicity, Mouth ulcers, Mucositis oral (01/12/2018), Odynophagia (06/01/2018), Oral thrush (03/19/2019), Pleuritic chest pain (06/05/2018), Primary osteoarthritis of both knees (11/09/2016), Rash due to systemic lupus erythematosus (SLE) ([place]), Raynaud disease, Raynaud's disease (08/11/2015), Relative acute adrenal insufficiency ([place]) (06/11/2018), Right leg DVT ([place]) (05/08/2019), Seasonal allergies (08/11/2015), Sepsis ([place]) (05/08/2019), Sinus tachycardia (03/19/2019), SLE (systemic lupus erythematosus) ([place]) (09/19/2016), Thrush (06/01/2018), Vaginal candidiasis (01/12/2018), and Vitamin D deficiency (11/09/2016).   It is normal symptoms have been present for several days. Onset was sudden.  Symptoms include malar rash, discoid rash, arthritis and pleuritic chest pain and are of severe  and generalized severity.  Symptoms are made worse by: movement, standing and walking.  Symptoms are helped by steriods and morphine.  Associated symptoms include arthralgia, fatigue, joint pain, morning stiffness, muscle weakness, pleurisy, rashes/photosensitive and Raynaud's. Patient denies associated fevers, new headache, nodules, palpitations and seizures. Patient is currently taking the following medications associate with SLE type syndrome: none.  She was recently treated in the emergency room for increased pain.  She has been in the emergency room on 2 separate occasions in the last 2 weeks which resulted in a 3-day admission.  She is followed by rheumatology at [facility].  She admits that her symptoms are not resolved.  She feels they are getting worse.  She was treated with prednisone 40 mg which was effective.  She was also receiving morphine as needed.  She admits that Tylenol 3 is not effective.  Previous Report(s) Reviewed: historical medical records      Past Medical History:  Diagnosis Date  . Accidental methotrexate overdose 01/08/2018  . Acute lower UTI   . Bronchitis 03/19/2019  . Cardiomegaly 05/08/2019  . Chronic midline low back pain without sciatica 11/09/2016   Facet joint arthropathy  . Dehydration 03/19/2019  . Depo-Provera contraceptive status 08/11/2015  . Depression 02/08/2017  . Dysphagia 06/01/2018  . Dysuria 04/19/2019  . Elevated AST (SGOT) 03/19/2019  . Essential hypertension 03/19/2019  . Facial edema   . Gout   . Hypertension   . IDA (iron deficiency anemia) 08/11/2015  . Immunosuppressed status ([place])   . Insomnia 01/29/2016  . Leukopenia 01/10/2018  . Lobar pneumonia ([place]) 06/07/2018  . Lupus ([place])   . Lupus (systemic lupus erythematosus) ([place]) 03/19/2019  . Metabolic acidosis [id number]  . Methotrexate toxicity   . Mouth ulcers   . Mucositis oral 01/12/2018  . Odynophagia 06/01/2018  . Oral thrush 03/19/2019  .  Pleuritic chest pain 06/05/2018  . Primary osteoarthritis  of both knees 11/09/2016  . Rash due to systemic lupus erythematosus (SLE) ([place])   . Raynaud disease   . Raynaud's disease 08/11/2015  . Relative acute adrenal insufficiency ([place]) 06/11/2018  . Right leg DVT ([place]) 05/08/2019  . Seasonal allergies 08/11/2015  . Sepsis ([place]) 05/08/2019  . Sinus tachycardia 03/19/2019  . SLE (systemic lupus erythematosus) ([place]) 09/19/2016     Positive ANA, double-stranded DNA, Ro, RNP, RF, history of nasal ulcers, fatigue, anemia, arthritis, Raynauds  . Thrush 06/01/2018  . Vaginal candidiasis 01/12/2018  . Vitamin D deficiency 11/09/2016    Past Surgical History:  Procedure Laterality Date  . ORIF SHOULDER FRACTURE Left   . TONSILLECTOMY      Family History  Problem Relation Age of Onset  . Aneurysm Mother   . Stroke Sister   . Seizures Sister   . Lung cancer Maternal Aunt   . Heart attack Paternal Uncle   . Colon cancer Neg Hx   . Esophageal cancer Neg Hx   . Pancreatic cancer Neg Hx   . Stomach cancer Neg Hx     Social History   Socioeconomic History  . Marital status: Single    Spouse name: Not on file  . Number of children: Not on file  . Years of education: Not on file  . Highest education level: Not on file  Occupational History  . Not on file  Tobacco Use  . Smoking status: Never Smoker  . Smokeless tobacco: Never Used  Substance and Sexual Activity  . Alcohol use: No  . Drug use: No  . Sexual activity: Not Currently  Other Topics Concern  . Not on file  Social History Narrative  . Not on file   Social Determinants of Health   Financial Resource Strain:   . Difficulty of Paying Living Expenses:   Food Insecurity:   . Worried About Charity fundraiser in the Last Year:   . Arboriculturist in the Last Year:   Transportation Needs:   . Film/video editor (Medical):   Marland Kitchen Lack of Transportation (Non-Medical):   Physical Activity:   . Days of Exercise per Week:   . Minutes of Exercise per Session:   Stress:   . Feeling of  Stress :   Social Connections:   . Frequency of Communication with Friends and Family:   . Frequency of Social Gatherings with Friends and Family:   . Attends Religious Services:   . Active Member of Clubs or Organizations:   . Attends Archivist Meetings:   Marland Kitchen Marital Status:   Intimate Partner Violence:   . Fear of Current or Ex-Partner:   . Emotionally Abused:   Marland Kitchen Physically Abused:   . Sexually Abused:     Outpatient Medications Prior to Visit  Medication Sig Dispense Refill  . acetaminophen-codeine (TYLENOL #3) 300-30 MG tablet Take 1 tablet by mouth every 6 (six) hours as needed for moderate pain. 12 tablet 0  . amLODipine (NORVASC) 10 MG tablet Take 1 tablet (10 mg total) by mouth daily. 30 tablet 0  . buPROPion (WELLBUTRIN XL) 300 MG 24 hr tablet Take 1 tablet (300 mg total) by mouth daily. 30 tablet 2  . ferrous sulfate 325 (65 FE) MG tablet Take 1 tablet (325 mg total) by mouth 2 (two) times daily. With 1/2 glass of orange juice 30 tablet 0  . gabapentin (NEURONTIN) 300 MG capsule Take 1  capsule (300 mg total) by mouth 3 (three) times daily. 90 capsule 3  . lisinopril (ZESTRIL) 20 MG tablet Take 20 mg by mouth daily.    Marland Kitchen loperamide (IMODIUM) 2 MG capsule Take 1 capsule (2 mg total) by mouth as needed for diarrhea or loose stools. 10 capsule 0  . metoprolol tartrate (LOPRESSOR) 25 MG tablet Take 1 tablet (25 mg total) by mouth 2 (two) times daily. 30 tablet 0  . Multiple Vitamins-Minerals (MULTIVITAMIN ADULT PO) Take 1 tablet by mouth daily.    . mycophenolate (CELLCEPT) 250 MG capsule Take 500 mg by mouth 2 (two) times daily.    . tacrolimus (PROGRAF) 1 MG capsule Take 1 mg by mouth 2 (two) times daily.    . traZODone (DESYREL) 100 MG tablet Take 1 tablet (100 mg total) by mouth at bedtime. 30 tablet 3  . Vitamin D, Ergocalciferol, (DRISDOL) 50000 units CAPS capsule Take 1 capsule (50,000 Units total) by mouth 2 (two) times a week. 24 capsule 0  . predniSONE  (DELTASONE) 20 MG tablet Take 0.5 tablets (10 mg total) by mouth daily with breakfast. 30 tablet 0  . folic acid (FOLVITE) 1 MG tablet Take 1 tablet (1 mg total) by mouth at bedtime. (Patient not taking: Reported on 12/11/2019) 30 tablet 0  . pantoprazole (PROTONIX) 20 MG tablet TAKE 1 TABLET BY MOUTH EVERYDAY AT BEDTIME*NOT COVERED** (Patient taking differently: Take 20 mg by mouth at bedtime. ) 30 tablet 0  . rivaroxaban (XARELTO) 10 MG TABS tablet Take 1 tablet (10 mg total) by mouth daily with supper. (Patient not taking: Reported on 12/11/2019) 30 tablet 6   No facility-administered medications prior to visit.    Allergies  Allergen Reactions  . Hydroxychloroquine Rash  . Latex Rash    Review of Systems  All other systems reviewed and are negative.      Objective:    Physical Exam Constitutional:      General: She is in acute distress.     Appearance: She is ill-appearing.  Cardiovascular:     Rate and Rhythm: Normal rate.     Pulses: Normal pulses.  Pulmonary:     Effort: Pulmonary effort is normal.     Breath sounds: Normal breath sounds.  Musculoskeletal:        General: Swelling and tenderness present.     Cervical back: Normal range of motion.     Comments: Decreased range of motion, altered gait  Skin:    Capillary Refill: Capillary refill takes 2 to 3 seconds.     Comments: Generalized rash  dark discoloration to right foot second toe on the anterior and lateral aspect with increased tenderness, no erythema or ulcers noted    Neurological:     Mental Status: She is alert and oriented to person, place, and time.     BP 104/76 (BP Location: Right Arm, Patient Position: Sitting, Cuff Size: Normal)   Pulse (!) 130   Temp 98.9 F (37.2 C) (Oral)   Resp 14   Ht 5\' 3"  (1.6 m)   Wt 139 lb (63 kg)   LMP 11/14/2019 Comment: neg hcg on 12/06/2019  SpO2 99%   BMI 24.62 kg/m  Wt Readings from Last 3 Encounters:  12/11/19 139 lb (63 kg)  12/06/19 147 lb (66.7  kg)  12/03/19 145 lb (65.8 kg)    There are no preventive care reminders to display for this patient.  There are no preventive care reminders to display for this patient.  Lab Results  Component Value Date   TSH 1.180 12/07/2019   Lab Results  Component Value Date   WBC 5.6 12/09/2019   HGB 11.5 (L) 12/09/2019   HCT 38.1 12/09/2019   MCV 83.6 12/09/2019   PLT 207 12/09/2019   Lab Results  Component Value Date   NA 139 12/09/2019   K 5.1 12/09/2019   CO2 24 12/09/2019   GLUCOSE 107 (H) 12/09/2019   BUN 16 12/09/2019   CREATININE 0.51 12/09/2019   BILITOT 0.5 12/06/2019   ALKPHOS 74 12/06/2019   AST 37 12/06/2019   ALT 17 12/06/2019   PROT 6.9 12/06/2019   ALBUMIN 2.6 (L) 12/06/2019   CALCIUM 9.4 12/09/2019   ANIONGAP 9 12/09/2019   No results found for: CHOL No results found for: HDL No results found for: LDLCALC No results found for: TRIG No results found for: CHOLHDL Lab Results  Component Value Date   HGBA1C 4.5 (L) 03/20/2019       Assessment & Plan:   Problem List Items Addressed This Visit      Unprioritized   Essential hypertension   Pleuritic chest pain - Primary   Rash due to systemic lupus erythematosus (SLE) ([place])      Meds ordered this encounter  Medications  . predniSONE (DELTASONE) 20 MG tablet    Sig: Take 1 tablet (20 mg total) by mouth 2 (two) times daily with a meal.    Dispense:  60 tablet    Refill:  0    Order Specific Question:   Supervising Provider    Answer:   Tresa Garter [id number]     Vevelyn Francois, NP

## 2019-12-23 ENCOUNTER — Other Ambulatory Visit: Payer: Self-pay | Admitting: Family Medicine

## 2019-12-23 ENCOUNTER — Telehealth: Payer: Self-pay

## 2019-12-23 ENCOUNTER — Other Ambulatory Visit: Payer: Self-pay | Admitting: Internal Medicine

## 2019-12-23 DIAGNOSIS — R634 Abnormal weight loss: Secondary | ICD-10-CM

## 2019-12-23 DIAGNOSIS — F0631 Mood disorder due to known physiological condition with depressive features: Secondary | ICD-10-CM

## 2019-12-23 MED ORDER — ACETAMINOPHEN-CODEINE #3 300-30 MG PO TABS: 1.0000 | ORAL_TABLET | Freq: Four times a day (QID) | ORAL | 0 refills | Status: DC | PRN

## 2019-12-23 NOTE — Telephone Encounter (Signed)
I would try omeprazole. If they do not cover pantoprazole, I doubt they would cover esomeprazole or lansoprazole.

## 2019-12-23 NOTE — Telephone Encounter (Signed)
Patient care center pt. Insurance reports pantoprazole is not covered.

## 2019-12-23 NOTE — Progress Notes (Signed)
Reviewed PDMP substance reporting system prior to prescribing opiate medications. No inconsistencies noted.    Meds ordered this encounter  Medications   acetaminophen-codeine (TYLENOL #3) 300-30 MG tablet    Sig: Take 1 tablet by mouth every 6 (six) hours as needed for moderate pain.    Dispense:  15 tablet    Refill:  0    Order Specific Question:   Supervising Provider    Answer:   JEGEDE, OLUGBEMIGA E [[id number]]    Alfredo Collymore Moore Andruw Battie  APRN, MSN, FNP-C [facility] [address]  [place],  [address] [phone]  

## 2019-12-23 NOTE — Telephone Encounter (Signed)
Pt called in refill on tylenol 3.

## 2019-12-23 NOTE — Telephone Encounter (Signed)
Do you have any idea what is covered in the same category?

## 2019-12-24 ENCOUNTER — Other Ambulatory Visit: Payer: Self-pay | Admitting: Family Medicine

## 2019-12-24 DIAGNOSIS — K219 Gastro-esophageal reflux disease without esophagitis: Secondary | ICD-10-CM

## 2019-12-24 MED ORDER — OMEPRAZOLE 20 MG PO CPDR: 20.0000 mg | DELAYED_RELEASE_CAPSULE | Freq: Every day | ORAL | 3 refills | Status: DC

## 2019-12-24 NOTE — Telephone Encounter (Signed)
Protonix is not covered for patient. Can you change this to omeprazole is appropriate?

## 2019-12-24 NOTE — Progress Notes (Signed)
Meds ordered this encounter  Medications  . omeprazole (PRILOSEC) 20 MG capsule    Sig: Take 1 capsule (20 mg total) by mouth daily.    Dispense:  30 capsule    Refill:  3    Order Specific Question:   Supervising Provider    Answer:   Tresa Garter [id number]    Donia Pounds  APRN, MSN, FNP-C Patient [name] [name] [address] [place], [place] [address] [phone]

## 2019-12-25 ENCOUNTER — Other Ambulatory Visit: Payer: Self-pay | Admitting: Family Medicine

## 2019-12-25 DIAGNOSIS — F3289 Other specified depressive episodes: Secondary | ICD-10-CM

## 2020-01-01 ENCOUNTER — Other Ambulatory Visit: Payer: Self-pay | Admitting: Internal Medicine

## 2020-01-06 ENCOUNTER — Other Ambulatory Visit: Payer: Self-pay

## 2020-01-06 ENCOUNTER — Encounter (HOSPITAL_BASED_OUTPATIENT_CLINIC_OR_DEPARTMENT_OTHER): Payer: Medicaid Other | Attending: Internal Medicine | Admitting: Internal Medicine

## 2020-01-06 DIAGNOSIS — Z7901 Long term (current) use of anticoagulants: Secondary | ICD-10-CM | POA: Insufficient documentation

## 2020-01-06 DIAGNOSIS — L97212 Non-pressure chronic ulcer of right calf with fat layer exposed: Secondary | ICD-10-CM | POA: Insufficient documentation

## 2020-01-06 DIAGNOSIS — M3219 Other organ or system involvement in systemic lupus erythematosus: Secondary | ICD-10-CM | POA: Insufficient documentation

## 2020-01-06 DIAGNOSIS — I7300 Raynaud's syndrome without gangrene: Secondary | ICD-10-CM | POA: Insufficient documentation

## 2020-01-06 DIAGNOSIS — Z86718 Personal history of other venous thrombosis and embolism: Secondary | ICD-10-CM | POA: Insufficient documentation

## 2020-01-06 DIAGNOSIS — L97211 Non-pressure chronic ulcer of right calf limited to breakdown of skin: Secondary | ICD-10-CM | POA: Insufficient documentation

## 2020-01-13 ENCOUNTER — Other Ambulatory Visit: Payer: Self-pay

## 2020-01-13 ENCOUNTER — Encounter (HOSPITAL_BASED_OUTPATIENT_CLINIC_OR_DEPARTMENT_OTHER): Payer: Self-pay | Attending: Internal Medicine | Admitting: Internal Medicine

## 2020-01-13 ENCOUNTER — Ambulatory Visit (HOSPITAL_COMMUNITY)
Admission: RE | Admit: 2020-01-13 | Discharge: 2020-01-13 | Disposition: A | Discharge status: Home / Self Care | Payer: Medicaid Other | Source: Ambulatory Visit | Attending: Surgery | Admitting: Surgery

## 2020-01-13 ENCOUNTER — Other Ambulatory Visit (HOSPITAL_COMMUNITY): Payer: Self-pay | Admitting: Internal Medicine

## 2020-01-13 DIAGNOSIS — D6862 Lupus anticoagulant syndrome: Secondary | ICD-10-CM | POA: Insufficient documentation

## 2020-01-13 DIAGNOSIS — I7300 Raynaud's syndrome without gangrene: Secondary | ICD-10-CM | POA: Diagnosis not present

## 2020-01-13 DIAGNOSIS — F329 Major depressive disorder, single episode, unspecified: Secondary | ICD-10-CM | POA: Insufficient documentation

## 2020-01-13 DIAGNOSIS — Z86718 Personal history of other venous thrombosis and embolism: Secondary | ICD-10-CM | POA: Insufficient documentation

## 2020-01-13 DIAGNOSIS — M3219 Other organ or system involvement in systemic lupus erythematosus: Secondary | ICD-10-CM | POA: Insufficient documentation

## 2020-01-13 DIAGNOSIS — Z7901 Long term (current) use of anticoagulants: Secondary | ICD-10-CM | POA: Insufficient documentation

## 2020-01-13 DIAGNOSIS — L97212 Non-pressure chronic ulcer of right calf with fat layer exposed: Secondary | ICD-10-CM | POA: Insufficient documentation

## 2020-01-13 DIAGNOSIS — M3214 Glomerular disease in systemic lupus erythematosus: Secondary | ICD-10-CM | POA: Insufficient documentation

## 2020-01-13 DIAGNOSIS — L97211 Non-pressure chronic ulcer of right calf limited to breakdown of skin: Secondary | ICD-10-CM | POA: Insufficient documentation

## 2020-01-13 DIAGNOSIS — Z86711 Personal history of pulmonary embolism: Secondary | ICD-10-CM | POA: Insufficient documentation

## 2020-01-13 NOTE — Progress Notes (Signed)
Wiedemann, Shiloh ([id number]) Visit Report for 01/13/2020 HPI Details Patient Name: Date of Service: EVYNN, [name] 01/13/2020 9:30 A M Medical Record Number: [id number] Patient Account Number: [id number] Date of Birth/Sex: Treating RN: 10-28-1995 (24 y.o. [name] Primary Care Provider: Merita Norton ULT, PRO V IDER Other Clinician: Referring Provider: Treating Provider/Extender: Cheree Ditto in Treatment: 1 History of Present Illness HPI Description: ADMISSION 01/06/2020 This is a complex 24 year old woman who arrives in clinic today for review of wounds on the right lateral leg. She was referred by her rheumatologist Dr. [name] at [facility]. The patient has a history of systemic lupus at age 89 with positive ANA positive double-stranded DNA positive SSA. She also has a history of Raynaud's phenomenon, lupus nephritis stage III previously biopsied. She also has a history of an extensive DVT and apparently pulmonary embolism in September 2020 and she has remained on Xarelto since then. Finally she has a history anticardiolipin antibody positive and positive lupus anticoagulant She tells me that she noted 2 skin lesions on the right lateral calf the larger one proximally and the smaller one medially sometime in January. She apparently was seen in the ER at the time felt to possibly have a current DVT in the leg. Apparently that was worked up and was negative. She was admitted to hospital from 3/26 through 3/29 and was felt to have a flare of her lupus. She was given Augmentin apparently for possible cellulitis in the right lower extremity. She was back in the ER on 3/29 the day she was discharged with pain in the leg. The patient states that the areas currently seen opened in February. She would been putting alcohol on it I think Dr. [name] recently gave her mupirocin. Past medical history; the patient is a non-smoker. She has never been pregnant. As noted she has a significant history of venous  thromboembolic disease although her recent DVT rule out was negative. She has hypertension, lupus nephritis Raynaud's syndrome and depression. She has Medicaid pending which we will have to keep in mind when we prescribe either drugs or wound care products. There currently paying for a lot of this out-of-pocket with help from family members ABI in our clinic was 0.89 on the right 01/13/2020 the patient has 2 areas on the right lateral calf with most significant one is proximal and a very tiny one down towards the ankle. Electronic Signature(s) Signed: 01/13/2020 5:33:20 PM By: Linton Ham MD Entered By: Linton Ham on 01/13/2020 10:15:05 -------------------------------------------------------------------------------- Physical Exam Details Patient Name: Date of Service: [name] [name] MIE L. 01/13/2020 9:30 A M Medical Record Number: [id number] Patient Account Number: [id number] Date of Birth/Sex: Treating RN: 12/07/1995 (24 y.o. [name] Primary Care Provider: Merita Norton ULT, PRO V IDER Other Clinician: Referring Provider: Treating Provider/Extender: Cheree Ditto in Treatment: 1 Constitutional Sitting or standing Blood Pressure is within target range for patient.. Pulse regular and within target range for patient.Marland Kitchen Respirations regular, non-labored and within target range.. Temperature is normal and within the target range for the patient.Marland Kitchen Appears in no distress. Cardiovascular Popliteal is difficult to feel but palpable her femoral is palpable.. Pedal pulses absent on the right. Notes Wound exam; the patient has 2 wounds on her right lateral calf. The most proximal one is most significant. This has undermining areas from 4-6 and at 11:00. Really quite substantial relative the size. There is some firmness in the tissue around the orifice as well. The area more distally is small eschared over I  did not debride this today. Electronic Signature(s) Signed: 01/13/2020 5:33:20 PM By:  Linton Ham MD Entered By: Linton Ham on 01/13/2020 10:16:24 -------------------------------------------------------------------------------- Physician Orders Details Patient Name: Date of Service: [name] [name] MIE L. 01/13/2020 9:30 A M Medical Record Number: [id number] Patient Account Number: [id number] Date of Birth/Sex: Treating RN: 1995/11/05 (24 y.o. 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 Dressing Change Frequency Wound #1 Right,Lateral Lower Leg Change Dressing every other day. Wound #2 Right,Distal,Lateral Lower Leg Change Dressing every other day. Wound Cleansing Wound #1 Right,Lateral Lower Leg May shower and wash wound with soap and water. - on days that dressing is changed Wound #2 Right,Distal,Lateral Lower Leg May shower and wash wound with soap and water. - on days that dressing is changed Primary Wound Dressing Wound #1 Right,Lateral Lower Leg Silver Collagen - moisten with hydrogel or KY jelly Wound #2 Right,Distal,Lateral Lower Leg Silver Collagen - moisten with hydrogel or KY jelly Secondary Dressing Wound #1 Right,Lateral Lower Leg Foam Border - or bandaid Wound #2 Right,Distal,Lateral Lower Leg Foam Border - or bandaid Electronic Signature(s) Signed: 01/13/2020 5:27:06 PM By: Levan Hurst RN, BSN Signed: 01/13/2020 5:33:20 PM By: Linton Ham MD Entered By: Levan Hurst on 01/13/2020  09:32:41 -------------------------------------------------------------------------------- Problem List Details Patient Name: Date of Service: [name] [name] MIE L. 01/13/2020 9:30 A M Medical Record Number: [id number] Patient Account Number: [id number] Date of Birth/Sex: Treating RN: 05-15-1996 (24 y.o. [name] Primary Care Provider: Merita Norton ULT, PRO Ave Filter Other Clinician: Referring Provider: Treating Provider/Extender: Cheree Ditto in Treatment: 1 Active Problems ICD-10 Encounter Code Description Active Date MDM Diagnosis L97.212 Non-pressure chronic ulcer of right calf with fat layer exposed 01/06/2020 No Yes L97.211 Non-pressure chronic ulcer of right calf limited to breakdown of skin 01/06/2020 No Yes M32.19 Other organ or system involvement in systemic lupus erythematosus 01/06/2020 No Yes I73.00 Raynaud's syndrome without gangrene 01/06/2020 No Yes Inactive Problems Resolved Problems Electronic Signature(s) Signed: 01/13/2020 5:33:20 PM By: Linton Ham MD Entered By: Linton Ham on 01/13/2020 10:14:11 -------------------------------------------------------------------------------- Progress Note Details Patient Name: Date of Service: [name] [name] MIE L. 01/13/2020 9:30 A M Medical Record Number: [id number] Patient Account Number: [id number] Date of Birth/Sex: Treating RN: 01/06/1996 (24 y.o. [name] Primary Care Provider: Merita Norton ULT, PRO Ave Filter Other Clinician: Referring Provider: Treating Provider/Extender: Cheree Ditto in Treatment: 1 Subjective History of Present Illness (HPI) ADMISSION 01/06/2020 This is a complex 24 year old woman who arrives in clinic today for review of wounds on the right lateral leg. She was referred by her rheumatologist Dr. [name] at [facility]. The patient has a history of systemic lupus at age 36 with positive ANA positive double-stranded DNA positive SSA. She also has a history of Raynaud's phenomenon, lupus nephritis stage  III previously biopsied. She also has a history of an extensive DVT and apparently pulmonary embolism in September 2020 and she has remained on Xarelto since then. Finally she has a history anticardiolipin antibody positive and positive lupus anticoagulant She tells me that she noted 2 skin lesions on the right lateral calf the larger one  proximally and the smaller one medially sometime in January. She apparently was seen in the ER at the time felt to possibly have a current DVT in the leg. Apparently that was worked up and was negative. She was admitted to hospital from 3/26 through 3/29 and was felt to have a flare of her lupus. She was given Augmentin apparently for possible cellulitis in the right lower extremity. She was back in the ER on 3/29 the day she was discharged with pain in the leg. The patient states that the areas currently seen opened in February. She would been putting alcohol on it I think Dr. [name] recently gave her mupirocin. Past medical history; the patient is a non-smoker. She has never been pregnant. As noted she has a significant history of venous thromboembolic disease although her recent DVT rule out was negative. She has hypertension, lupus nephritis Raynaud's syndrome and depression. She has Medicaid pending which we will have to keep in mind when we prescribe either drugs or wound care products. There currently paying for a lot of this out-of-pocket with help from family members ABI in our clinic was 0.89 on the right 01/13/2020 the patient has 2 areas on the right lateral calf with most significant one is proximal and a very tiny one down towards the ankle. Objective Constitutional Sitting or standing Blood Pressure is within target range for patient.. Pulse regular and within target range for patient.Marland Kitchen Respirations regular, non-labored and within target range.. Temperature is normal and within the target range for the patient.Marland Kitchen Appears in no distress. Vitals Time  Taken: 9:37 AM, Height: 63 in, Weight: 152 lbs, BMI: 26.9, Temperature: 98.1 F, Pulse: 107 bpm, Respiratory Rate: 19 breaths/min, Blood Pressure: 131/88 mmHg. Cardiovascular Popliteal is difficult to feel but palpable her femoral is palpable.. Pedal pulses absent on the right. General Notes: Wound exam; the patient has 2 wounds on her right lateral calf. The most proximal one is most significant. This has undermining areas from 4-6 and at 11:00. Really quite substantial relative the size. There is some firmness in the tissue around the orifice as well. The area more distally is small eschared over I did not debride this today. Integumentary (Hair, Skin) Wound #1 status is Open. Original cause of wound was Gradually Appeared. The wound is located on the Right,Lateral Lower Leg. The wound measures 1.6cm length x 1.5cm width x 0.7cm depth; 1.885cm^2 area and 1.319cm^3 volume. There is Fat Layer (Subcutaneous Tissue) Exposed exposed. Tunneling has been noted at 11:00 with a maximum distance of 1.5cm. Undermining begins at 2:00 and ends at 6:00 with a maximum distance of 1.4cm. There is a medium amount of purulent drainage noted. The wound margin is well defined and not attached to the wound base. There is large (67-100%) red granulation within the wound bed. There is a small (1-33%) amount of necrotic tissue within the wound bed including Adherent Slough. Wound #2 status is Open. Original cause of wound was Gradually Appeared. The wound is located on the Right,Distal,Lateral Lower Leg. The wound measures 0.1cm length x 0.1cm width x 0.1cm depth; 0.008cm^2 area and 0.001cm^3 volume. Assessment Active Problems ICD-10 Non-pressure chronic ulcer of right calf with fat layer exposed Non-pressure chronic ulcer of right calf limited to breakdown of skin Other organ or system involvement in systemic lupus erythematosus Raynaud's syndrome without gangrene Plan Follow-up Appointments: Return Appointment  in 1 week. Dressing Change Frequency: Wound #1 Right,Lateral Lower Leg: Change Dressing every other day. Wound #2 Right,Distal,Lateral Lower Leg: Change  Dressing every other day. Wound Cleansing: Wound #1 Right,Lateral Lower Leg: May shower and wash wound with soap and water. - on days that dressing is changed Wound #2 Right,Distal,Lateral Lower Leg: May shower and wash wound with soap and water. - on days that dressing is changed Primary Wound Dressing: Wound #1 Right,Lateral Lower Leg: Silver Collagen - moisten with hydrogel or KY jelly Wound #2 Right,Distal,Lateral Lower Leg: Silver Collagen - moisten with hydrogel or KY jelly Secondary Dressing: Wound #1 Right,Lateral Lower Leg: Foam Border - or bandaid Wound #2 Right,Distal,Lateral Lower Leg: Foam Border - or bandaid 1. The cause of her wounds is still unclear 2. She has arterial studies this afternoon hopefully with that will include ABIs TBI's and arterial Dopplers. She describes about 2-1/2 isle claudication when she walks around the grocery store which is about the maximal amount of activity she does. 3. She clearly also has Raynaud's phenomenon with discoloration and pain in her toes in the cold. 4. Pending her arterial studies we will either if have to refer her to vascular. If on the other hand things are normal I would like to go ahead and try and biopsy or culture the major wound. I may consider referring her to dermatology at [facility] for this predominantly for the ability to send this to an academic pathologist. 5. I also wonder whether she has antiphospholipid syndrome she has a history of DVT and if she has arterial insufficiency this would be highly suggestive in my view. I would discuss with her rheumatologist about this. Electronic Signature(s) Signed: 01/13/2020 5:33:20 PM By: Linton Ham MD Entered By: Linton Ham on 01/13/2020  10:18:31 -------------------------------------------------------------------------------- SuperBill Details Patient Name: Date of Service: [name] [name] 01/13/2020 Medical Record Number: [id number] Patient Account Number: [id number] Date of Birth/Sex: Treating RN: March 26, 1996 (24 y.o. [name] Primary Care Provider: Merita Norton ULT, PRO V IDER Other Clinician: Referring Provider: Treating Provider/Extender: Cheree Ditto in Treatment: 1 Diagnosis Coding ICD-10 Codes Code Description [phone] Non-pressure chronic ulcer of right calf with fat layer exposed L97.211 Non-pressure chronic ulcer of right calf limited to breakdown of skin M32.19 Other organ or system involvement in systemic lupus erythematosus I73.00 Raynaud's syndrome without gangrene Facility Procedures CPT4 Code: [id number] Description: 99214 - WOUND CARE VISIT-LEV 4 EST PT Modifier: Quantity: 1 Physician Procedures : CPT4 Code Description Modifier [id number] - WC PHYS LEVEL 4 - EST PT ICD-10 Diagnosis Description L97.212 Non-pressure chronic ulcer of right calf with fat layer exposed L97.211 Non-pressure chronic ulcer of right calf limited to breakdown of skin  M32.19 Other organ or system involvement in systemic lupus erythematosus I73.00 Raynaud's syndrome without gangrene Quantity: 1 Electronic Signature(s) Signed: 01/13/2020 5:33:20 PM By: Linton Ham MD Entered By: Linton Ham on 01/13/2020 10:18:49

## 2020-01-13 NOTE — Progress Notes (Signed)
Akhtar, Farson ([id number]) Visit Report for 01/13/2020 Arrival Information Details Patient Name: Date of Service: EDYE, [name] [name] 01/13/2020 9:30 A M Medical Record Number: [id number] Patient Account Number: [id number] Date of Birth/Sex: Treating RN: [birth year] (24 y.o. Hollie Salk, Larene Beach Primary Care Dwyne Hasegawa: Merita Norton ULT, PRO V IDER Other Clinician: Referring Kailany Dinunzio: Treating Omaree Fuqua/Extender: Cheree Ditto in Treatment: 1 Visit Information History Since Last Visit Added or deleted any medications: No Patient Arrived: Ambulatory Any new allergies or adverse reactions: No Arrival Time: 09:36 Had a fall or experienced change in No Accompanied By: family member activities of daily living that may affect Transfer Assistance: None risk of falls: Patient Identification Verified: Yes Signs or symptoms of abuse/neglect since last visito No Secondary Verification Process Completed: Yes Hospitalized since last visit: No Patient Requires Transmission-Based Precautions: No Implantable device outside of the clinic excluding No Patient Has Alerts: No cellular tissue based products placed in the center since last visit: Has Dressing in Place as Prescribed: Yes Pain Present Now: Yes Electronic Signature(s) Signed: 01/13/2020 4:55:42 PM By: Kela Millin Entered By: Kela Millin on 01/13/2020 09:37:17 -------------------------------------------------------------------------------- Clinic Level of Care Assessment Details Patient Name: Date of Service: CHANEQUA, [name] [name] 01/13/2020 9:30 A M Medical Record Number: [id number] Patient Account Number: [id number] Date of Birth/Sex: Treating RN: September 29, 1995 (24 y.o. [name] Primary Care Tullio Chausse: Merita Norton ULT, PRO V IDER Other Clinician: Referring Devone Tousley: Treating Mostyn Varnell/Extender: Cheree Ditto in Treatment: 1 Clinic Level of Care Assessment Items TOOL 4 Quantity Score X- 1 0 Use when only an EandM is performed on  FOLLOW-UP visit ASSESSMENTS - Nursing Assessment / Reassessment X- 1 10 Reassessment of Co-morbidities (includes updates in patient status) X- 1 5 Reassessment of Adherence to Treatment Plan ASSESSMENTS - Wound and Skin A ssessment / Reassessment []  - 0 Simple Wound Assessment / Reassessment - one wound X- 2 5 Complex Wound Assessment / Reassessment - multiple wounds []  - 0 Dermatologic / Skin Assessment (not related to wound area) ASSESSMENTS - Focused Assessment []  - 0 Circumferential Edema Measurements - multi extremities []  - 0 Nutritional Assessment / Counseling / Intervention X- 1 5 Lower Extremity Assessment (monofilament, tuning fork, pulses) []  - 0 Peripheral Arterial Disease Assessment (using hand held doppler) ASSESSMENTS - Ostomy and/or Continence Assessment and Care []  - 0 Incontinence Assessment and Management []  - 0 Ostomy Care Assessment and Management (repouching, etc.) PROCESS - Coordination of Care X - Simple Patient / Family Education for ongoing care 1 15 []  - 0 Complex (extensive) Patient / Family Education for ongoing care X- 1 10 Staff obtains Programmer, systems, Records, T Results / Process Orders est []  - 0 Staff telephones HHA, Nursing Homes / Clarify orders / etc []  - 0 Routine Transfer to another Facility (non-emergent condition) []  - 0 Routine Hospital Admission (non-emergent condition) []  - 0 New Admissions / Biomedical engineer / Ordering NPWT Apligraf, etc. , []  - 0 Emergency Hospital Admission (emergent condition) X- 1 10 Simple Discharge Coordination []  - 0 Complex (extensive) Discharge Coordination PROCESS - Special Needs []  - 0 Pediatric / Minor Patient Management []  - 0 Isolation Patient Management []  - 0 Hearing / Language / Visual special needs []  - 0 Assessment of Community assistance (transportation, D/C planning, etc.) []  - 0 Additional assistance / Altered mentation []  - 0 Support Surface(s) Assessment (bed, cushion,  seat, etc.) INTERVENTIONS - Wound Cleansing / Measurement []  - 0 Simple Wound Cleansing - one wound X- 2 5 Complex Wound Cleansing - multiple  wounds X- 1 5 Wound Imaging (photographs - any number of wounds) []  - 0 Wound Tracing (instead of photographs) []  - 0 Simple Wound Measurement - one wound X- 2 5 Complex Wound Measurement - multiple wounds INTERVENTIONS - Wound Dressings X - Small Wound Dressing one or multiple wounds 2 10 []  - 0 Medium Wound Dressing one or multiple wounds []  - 0 Large Wound Dressing one or multiple wounds X- 1 5 Application of Medications - topical []  - 0 Application of Medications - injection INTERVENTIONS - Miscellaneous []  - 0 External ear exam []  - 0 Specimen Collection (cultures, biopsies, blood, body fluids, etc.) []  - 0 Specimen(s) / Culture(s) sent or taken to Lab for analysis []  - 0 Patient Transfer (multiple staff / Civil Service fast streamer / Similar devices) []  - 0 Simple Staple / Suture removal (25 or less) []  - 0 Complex Staple / Suture removal (26 or more) []  - 0 Hypo / Hyperglycemic Management (close monitor of Blood Glucose) []  - 0 Ankle / Brachial Index (ABI) - do not check if billed separately X- 1 5 Vital Signs Has the patient been seen at the hospital within the last three years: Yes Total Score: 120 Level Of Care: New/Established - Level 4 Electronic Signature(s) Signed: 01/13/2020 5:27:06 PM By: Levan Hurst RN, BSN Entered By: Levan Hurst on 01/13/2020 10:09:45 -------------------------------------------------------------------------------- Encounter Discharge Information Details Patient Name: Date of Service: [name] 01/13/2020 9:30 A M Medical Record Number: [id number] Patient Account Number: [id number] Date of Birth/Sex: Treating RN: [birth year] (24 y.o. Clearnce Sorrel Primary Care Mandrell Vangilder: Merita Norton ULT, PRO Ave Filter Other Clinician: Referring Delonte Musich: Treating Kirin Brandenburger/Extender: Cheree Ditto in  Treatment: 1 Encounter Discharge Information Items Discharge Condition: Stable Ambulatory Status: Ambulatory Discharge Destination: Home Transportation: Private Auto Accompanied By: family member Schedule Follow-up Appointment: Yes Clinical Summary of Care: Patient Declined Electronic Signature(s) Signed: 01/13/2020 4:55:42 PM By: Kela Millin Entered By: Kela Millin on 01/13/2020 10:15:49 -------------------------------------------------------------------------------- Lower Extremity Assessment Details Patient Name: Date of Service: [name] 01/13/2020 9:30 A M Medical Record Number: [id number] Patient Account Number: [id number] Date of Birth/Sex: Treating RN: Feb 26, 1996 (24 y.o. Clearnce Sorrel Primary Care Ravyn Nikkel: Merita Norton ULT, PRO Ave Filter Other Clinician: Referring Jazleen Robeck: Treating Dorwin Fitzhenry/Extender: Cheree Ditto in Treatment: 1 Edema Assessment Assessed: [Left: No] [Right: No] [Left: Edema] [Right: :] Calf Left: Right: Point of Measurement: 39 cm From Medial Instep cm 31 cm Ankle Left: Right: Point of Measurement: 9 cm From Medial Instep cm 21 cm Vascular Assessment Pulses: Dorsalis Pedis Palpable: [Right:Yes] Electronic Signature(s) Signed: 01/13/2020 4:55:42 PM By: Kela Millin Entered By: Kela Millin on 01/13/2020 09:39:22 -------------------------------------------------------------------------------- Multi Wound Chart Details Patient Name: Date of Service: [name] 01/13/2020 9:30 A M Medical Record Number: [id number] Patient Account Number: [id number] Date of Birth/Sex: Treating RN: 1996-03-04 (24 y.o. [name] Primary Care Pearson Reasons: Merita Norton ULT, PRO Ave Filter Other Clinician: Referring Candiace West: Treating Alyzza Andringa/Extender: Cheree Ditto in Treatment: 1 Vital Signs Height(in): 63 Pulse(bpm): 107 Weight(lbs): 152 Blood Pressure(mmHg): 131/88 Body Mass Index(BMI): 27 Temperature(F): 98.1 Respiratory  Rate(breaths/min): 19 Photos: [1:No Photos Right, Lateral Lower Leg] [2:No Photos Right, Distal, Lateral Lower Leg] [N/A:N/A N/A] Wound Location: [1:Gradually Appeared] [2:Gradually Appeared] [N/A:N/A] Wounding Event: [1:Inflammatory] [2:Inflammatory] [N/A:N/A] Primary Etiology: [1:Hypertension, Lupus Erythematosus,] [2:N/A] [N/A:N/A] Comorbid History: [1:Raynauds 10/14/2019] [2:10/14/2019] [N/A:N/A] Date Acquired: [1:1] [2:1] [N/A:N/A] Weeks of Treatment: [1:Open] [2:Open] [N/A:N/A] Wound Status: [1:1.6x1.5x0.7] [2:0.1x0.1x0.1] [N/A:N/A] Measurements L x W x D (cm) [1:1.885] [2:0.008] [N/A:N/A] A (  cm) : rea [1:1.319] [2:0.001] [N/A:N/A] Volume (cm) : [1:-23.00%] [2:83.00%] [N/A:N/A] % Reduction in A rea: [1:-43.50%] [2:88.90%] [N/A:N/A] % Reduction in Volume: [1:11] Position 1 (o'clock): [1:1.5] Maximum Distance 1 (cm): [1:2] Starting Position 1 (o'clock): [1:6] Ending Position 1 (o'clock): [1:1.4] Maximum Distance 1 (cm): [1:Yes] [2:N/A] [N/A:N/A] Tunneling: [1:Yes] [2:N/A] [N/A:N/A] Undermining: [1:Full Thickness Without Exposed] [2:Full Thickness Without Exposed] [N/A:N/A] Classification: [1:Support Structures Medium] [2:Support Structures N/A] [N/A:N/A] Exudate Amount: [1:Purulent] [2:N/A] [N/A:N/A] Exudate Type: [1:yellow, brown, green] [2:N/A] [N/A:N/A] Exudate Color: [1:Well defined, not attached] [2:N/A] [N/A:N/A] Wound Margin: [1:Large (67-100%)] [2:N/A] [N/A:N/A] Granulation Amount: [1:Red] [2:N/A] [N/A:N/A] Granulation Quality: [1:Small (1-33%)] [2:N/A] [N/A:N/A] Necrotic Amount: [1:Fat Layer (Subcutaneous Tissue)] [2:N/A] [N/A:N/A] Exposed Structures: [1:Exposed: Yes Fascia: No Tendon: No Muscle: No Joint: No Bone: No None] [2:N/A] [N/A:N/A] Treatment Notes Electronic Signature(s) Signed: 01/13/2020 5:27:06 PM By: Levan Hurst RN, BSN Signed: 01/13/2020 5:33:20 PM By: Linton Ham MD Entered By: Linton Ham on 01/13/2020  10:14:18 -------------------------------------------------------------------------------- Multi-Disciplinary Care Plan Details Patient Name: Date of Service: [name] 01/13/2020 9:30 A M Medical Record Number: [id number] Patient Account Number: [id number] Date of Birth/Sex: Treating RN: [birth year] (24 y.o. [name] Primary Care Zaxton Angerer: Merita Norton ULT, PRO Ave Filter Other Clinician: Referring Larhonda Dettloff: Treating Namiyah Grantham/Extender: Cheree Ditto in Treatment: 1 Active Inactive Wound/Skin Impairment Nursing Diagnoses: Impaired tissue integrity Knowledge deficit related to ulceration/compromised skin integrity Goals: Patient/caregiver will verbalize understanding of skin care regimen Date Initiated: 01/06/2020 Target Resolution Date: 02/07/2020 Goal Status: Active Ulcer/skin breakdown will have a volume reduction of 30% by week 4 Date Initiated: 01/06/2020 Target Resolution Date: 02/07/2020 Goal Status: Active Interventions: Assess patient/caregiver ability to obtain necessary supplies Assess patient/caregiver ability to perform ulcer/skin care regimen upon admission and as needed Assess ulceration(s) every visit Provide education on ulcer and skin care Notes: Electronic Signature(s) Signed: 01/13/2020 5:27:06 PM By: Levan Hurst RN, BSN Entered By: Levan Hurst on 01/13/2020 09:32:48 -------------------------------------------------------------------------------- Pain Assessment Details Patient Name: Date of Service: [name] 01/13/2020 9:30 A M Medical Record Number: [id number] Patient Account Number: [id number] Date of Birth/Sex: Treating RN: 1996-01-04 (24 y.o. Clearnce Sorrel Primary Care Aura Bibby: Merita Norton ULT, PRO Ave Filter Other Clinician: Referring Bernardino Dowell: Treating Kimila Papaleo/Extender: Cheree Ditto in Treatment: 1 Active Problems Location of Pain Severity and Description of Pain Patient Has Paino Yes Site Locations Pain Location: Pain  Location: Pain in Ulcers With Dressing Change: Yes Duration of the Pain. Constant / Intermittento Constant Rate the pain. Current Pain Level: 6 Worst Pain Level: 7 Least Pain Level: 5 Tolerable Pain Level: 6 Character of Pain Describe the Pain: Aching, Burning Pain Management and Medication Current Pain Management: Electronic Signature(s) Signed: 01/13/2020 4:55:42 PM By: Kela Millin Entered By: Kela Millin on 01/13/2020 09:39:09 -------------------------------------------------------------------------------- Patient/Caregiver Education Details Patient Name: Date of Service: [name] [name] 5/3/2021andnbsp9:30 A M Medical Record Number: [id number] Patient Account Number: [id number] Date of Birth/Gender: Treating RN: [birth year] (24 y.o. [name] Primary Care Physician: Merita Norton ULT, PRO V IDER Other Clinician: Referring Physician: Treating Physician/Extender: Cheree Ditto in Treatment: 1 Education Assessment Education Provided To: Patient Education Topics Provided Wound/Skin Impairment: Methods: Explain/Verbal Responses: State content correctly Electronic Signature(s) Signed: 01/13/2020 5:27:06 PM By: Levan Hurst RN, BSN Entered By: Levan Hurst on 01/13/2020 09:33:31 -------------------------------------------------------------------------------- Wound Assessment Details Patient Name: Date of Service: [name] 01/13/2020 9:30 A M Medical Record Number: [id number] Patient Account Number: [id number] Date of Birth/Sex: Treating RN: 23-Jan-1996 (24 y.o. Clearnce Sorrel Primary Care Scharlene Catalina:  DEFA ULT, PRO V IDER Other Clinician: Referring Delonda Coley: Treating Goble Fudala/Extender: Cheree Ditto in Treatment: 1 Wound Status Wound Number: 1 Primary Etiology: Inflammatory Wound Location: Right, Lateral Lower Leg Wound Status: Open Wounding Event: Gradually Appeared Comorbid History: Hypertension, Lupus Erythematosus,  Raynauds Date Acquired: 10/14/2019 Weeks Of Treatment: 1 Clustered Wound: No Wound Measurements Length: (cm) 1.6 Width: (cm) 1.5 Depth: (cm) 0.7 Area: (cm) 1.885 Volume: (cm) 1.319 % Reduction in Area: -23% % Reduction in Volume: -43.5% Epithelialization: None Tunneling: Yes Position (o'clock): 11 Maximum Distance: (cm) 1.5 Undermining: Yes Starting Position (o'clock): 2 Ending Position (o'clock): 6 Maximum Distance: (cm) 1.4 Wound Description Classification: Full Thickness Without Exposed Support Structures Wound Margin: Well defined, not attached Exudate Amount: Medium Exudate Type: Purulent Exudate Color: yellow, brown, green Foul Odor After Cleansing: No Slough/Fibrino Yes Wound Bed Granulation Amount: Large (67-100%) Exposed Structure Granulation Quality: Red Fascia Exposed: No Necrotic Amount: Small (1-33%) Fat Layer (Subcutaneous Tissue) Exposed: Yes Necrotic Quality: Adherent Slough Tendon Exposed: No Muscle Exposed: No Joint Exposed: No Bone Exposed: No Treatment Notes Wound #1 (Right, Lateral Lower Leg) 1. Cleanse With Wound Cleanser 2. Periwound Care Skin Prep 3. Primary Dressing Applied Collegen AG 4. Secondary Dressing Foam Border Dressing Electronic Signature(s) Signed: 01/13/2020 4:55:42 PM By: Kela Millin Entered By: Kela Millin on 01/13/2020 09:45:17 -------------------------------------------------------------------------------- Wound Assessment Details Patient Name: Date of Service: [name] 01/13/2020 9:30 A M Medical Record Number: [id number] Patient Account Number: [id number] Date of Birth/Sex: Treating RN: [birth year] (24 y.o. [name] Primary Care Kristyna Bradstreet: Merita Norton ULT, PRO V IDER Other Clinician: Referring Addisen Chappelle: Treating Benzion Mesta/Extender: Cheree Ditto in Treatment: 1 Wound Status Wound Number: 2 Primary Etiology: Inflammatory Wound Location: Right, Distal, Lateral Lower Leg Wound Status:  Open Wounding Event: Gradually Appeared Date Acquired: 10/14/2019 Weeks Of Treatment: 1 Clustered Wound: No Wound Measurements Length: (cm) 0.1 Width: (cm) 0.1 Depth: (cm) 0.1 Area: (cm) 0.008 Volume: (cm) 0.001 % Reduction in Area: 83% % Reduction in Volume: 88.9% Wound Description Classification: Full Thickness Without Exposed Support Structur es Treatment Notes Wound #2 (Right, Distal, Lateral Lower Leg) 1. Cleanse With Wound Cleanser 2. Periwound Care Skin Prep 3. Primary Dressing Applied Collegen AG 4. Secondary Dressing Foam Border Dressing Electronic Signature(s) Signed: 01/13/2020 5:27:06 PM By: Levan Hurst RN, BSN Entered By: Levan Hurst on 01/13/2020 10:12:16 -------------------------------------------------------------------------------- Vitals Details Patient Name: Date of Service: [name] 01/13/2020 9:30 A M Medical Record Number: [id number] Patient Account Number: [id number] Date of Birth/Sex: Treating RN: 1996-07-02 (24 y.o. Clearnce Sorrel Primary Care Ayano Douthitt: Merita Norton ULT, PRO V IDER Other Clinician: Referring Yorel Redder: Treating Preesha Benjamin/Extender: Cheree Ditto in Treatment: 1 Vital Signs Time Taken: 09:37 Temperature (F): 98.1 Height (in): 63 Pulse (bpm): 107 Weight (lbs): 152 Respiratory Rate (breaths/min): 19 Body Mass Index (BMI): 26.9 Blood Pressure (mmHg): 131/88 Reference Range: 80 - 120 mg / dl Electronic Signature(s) Signed: 01/13/2020 4:55:42 PM By: Kela Millin Entered By: Kela Millin on 01/13/2020 09:37:49

## 2020-01-20 ENCOUNTER — Encounter (HOSPITAL_BASED_OUTPATIENT_CLINIC_OR_DEPARTMENT_OTHER): Payer: Medicaid Other | Admitting: Internal Medicine

## 2020-01-20 ENCOUNTER — Encounter (HOSPITAL_COMMUNITY): Payer: Medicaid Other

## 2020-01-21 NOTE — Progress Notes (Signed)
[name] [name], [name] [name] ([id number]) Visit Report for 01/20/2020 HPI Details Patient Name: Date of Service: [name] [name], [name] [name] 01/20/2020 9:45 A M Medical Record Number: [id number] Patient Account Number: [id number] Date of Birth/Sex: Treating RN: 10/09/1995 (24 y.o. [name] [name] Primary Care Provider: Merita Norton ULT, PRO V IDER Other Clinician: Referring Provider: Treating Provider/Extender: Cheree Ditto in Treatment: 2 History of Present Illness HPI Description: ADMISSION 01/06/2020 This is a complex 24 year old woman who arrives in clinic today for review of wounds on the right lateral leg. She was referred by her rheumatologist Dr. [name] at [facility]. The patient has a history of systemic lupus at age 59 with positive ANA positive double-stranded DNA positive SSA. She also has a history of Raynaud's phenomenon, lupus nephritis stage III previously biopsied. She also has a history of an extensive DVT and apparently pulmonary embolism in September 2020 and she has remained on Xarelto since then. Finally she has a history anticardiolipin antibody positive and positive lupus anticoagulant She tells me that she noted 2 skin lesions on the right lateral calf the larger one proximally and the smaller one medially sometime in January. She apparently was seen in the ER at the time felt to possibly have a current DVT in the leg. Apparently that was worked up and was negative. She was admitted to hospital from 3/26 through 3/29 and was felt to have a flare of her lupus. She was given Augmentin apparently for possible cellulitis in the right lower extremity. She was back in the ER on 3/29 the day she was discharged with pain in the leg. The patient states that the areas currently seen opened in February. She would been putting alcohol on it I think Dr. [name] recently gave her mupirocin. Past medical history; the patient is a non-smoker. She has never been pregnant. As noted she has a significant history of  venous thromboembolic disease although her recent DVT rule out was negative. She has hypertension, lupus nephritis Raynaud's syndrome and depression. She has Medicaid pending which we will have to keep in mind when we prescribe either drugs or wound care products. There currently paying for a lot of this out-of-pocket with help from family members ABI in our clinic was 0.89 on the right 01/13/2020 the patient has 2 areas on the right lateral calf with most significant one is proximal and a very tiny one down towards the ankle. 5/10; ABI on the right at 0.92 however TBI at 0.37 and only monophasic and biphasic waveforms. On the left waveforms were triphasic ABI at 1.12 TBI of 0.54. Although the suggest mild lower extremity arterial disease this woman is only 24 years old I would like her seen by vascular surgery. She has lupus and has had a history of DVT I remember correctly she has anticardiolipin antibody and positive lupus anticoagulant. If we can prove she has arterial disease I will . talk to her rheumatologist about whether she needs to see hematology. I have not given up on the idea of biopsying this although she is on Xarelto. Electronic Signature(s) Signed: 01/21/2020 8:11:21 AM By: Linton Ham MD Entered By: Linton Ham on 01/20/2020 10:36:11 -------------------------------------------------------------------------------- Physical Exam Details Patient Name: Date of Service: [name] [name] MIE L. 01/20/2020 9:45 A M Medical Record Number: [id number] Patient Account Number: [id number] Date of Birth/Sex: Treating RN: 1996-08-19 (24 y.o. [name] [name] Primary Care Provider: Merita Norton ULT, PRO V IDER Other Clinician: Referring Provider: Treating Provider/Extender: Cheree Ditto in Treatment: 2 Constitutional Patient is hypertensive.. Pulse  regular and within target range for patient.Marland Kitchen Respirations regular, non-labored and within target range.. Temperature is normal and within the  target range for the patient.Marland Kitchen Appears in no distress. Cardiovascular Pedal pulses are absent on the right. Integumentary (Hair, Skin) No rashes are seen. Notes Wound exam; the patient has 2 open wounds on the right lateral calf. The most proximal 1 is most significant a small 1 distally. Her peripheral pulses are difficult to feel. There is no rash. Electronic Signature(s) Signed: 01/21/2020 8:11:21 AM By: Linton Ham MD Entered By: Linton Ham on 01/20/2020 10:37:15 -------------------------------------------------------------------------------- Physician Orders Details Patient Name: Date of Service: [name] [name] MIE L. 01/20/2020 9:45 A M Medical Record Number: [id number] Patient Account Number: [id number] Date of Birth/Sex: Treating RN: Jul 27, 1996 (24 y.o. [name] [name] Primary Care Provider: Merita Norton ULT, PRO V IDER Other Clinician: Referring Provider: Treating Provider/Extender: Cheree Ditto in Treatment: 2 Verbal / Phone Orders: No Diagnosis Coding ICD-10 Coding Code Description [phone] Non-pressure chronic ulcer of right calf with fat layer exposed L97.211 Non-pressure chronic ulcer of right calf limited to breakdown of skin M32.19 Other organ or system involvement in systemic lupus erythematosus I73.00 Raynaud's syndrome without gangrene Follow-up Appointments Return Appointment in 1 week. Dressing Change Frequency Wound #1 Right,Lateral Lower Leg Change Dressing every other day. Wound #2 Right,Distal,Lateral Lower Leg Change Dressing every other day. Wound Cleansing Wound #1 Right,Lateral Lower Leg May shower and wash wound with soap and water. - on days that dressing is changed Wound #2 Right,Distal,Lateral Lower Leg May shower and wash wound with soap and water. - on days that dressing is changed Primary Wound Dressing Wound #1 Right,Lateral Lower Leg Silver Collagen - moisten with hydrogel or KY jelly Wound #2 Right,Distal,Lateral Lower Leg Silver  Collagen - moisten with hydrogel or KY jelly Secondary Dressing Wound #1 Right,Lateral Lower Leg Foam Border - or bandaid Wound #2 Right,Distal,Lateral Lower Leg Foam Border - or bandaid Electronic Signature(s) Signed: 01/21/2020 8:11:21 AM By: Linton Ham MD Signed: 01/21/2020 5:17:28 PM By: Levan Hurst RN, BSN Entered By: Levan Hurst on 01/20/2020 10:16:08 -------------------------------------------------------------------------------- Problem List Details Patient Name: Date of Service: [name] [name] MIE L. 01/20/2020 9:45 A M Medical Record Number: [id number] Patient Account Number: [id number] Date of Birth/Sex: Treating RN: [birth year] (24 y.o. [name] [name] Primary Care Provider: Merita Norton ULT, PRO Ave Filter Other Clinician: Referring Provider: Treating Provider/Extender: Cheree Ditto in Treatment: 2 Active Problems ICD-10 Encounter Code Description Active Date MDM Diagnosis L97.212 Non-pressure chronic ulcer of right calf with fat layer exposed 01/06/2020 No Yes L97.211 Non-pressure chronic ulcer of right calf limited to breakdown of skin 01/06/2020 No Yes M32.19 Other organ or system involvement in systemic lupus erythematosus 01/06/2020 No Yes I73.00 Raynaud's syndrome without gangrene 01/06/2020 No Yes Inactive Problems Resolved Problems Electronic Signature(s) Signed: 01/21/2020 8:11:21 AM By: Linton Ham MD Entered By: Linton Ham on 01/20/2020 10:33:39 -------------------------------------------------------------------------------- Progress Note Details Patient Name: Date of Service: [name] [name] MIE L. 01/20/2020 9:45 A M Medical Record Number: [id number] Patient Account Number: [id number] Date of Birth/Sex: Treating RN: 15-Feb-1996 (24 y.o. [name] [name] Primary Care Provider: Merita Norton ULT, PRO Ave Filter Other Clinician: Referring Provider: Treating Provider/Extender: Cheree Ditto in Treatment: 2 Subjective History of Present Illness  (HPI) ADMISSION 01/06/2020 This is a complex 24 year old woman who arrives in clinic today for review of wounds on the right lateral leg. She was referred by her rheumatologist Dr. [name] at [facility]. The patient has a history of systemic lupus at age 40  with positive ANA positive double-stranded DNA positive SSA. She also has a history of Raynaud's phenomenon, lupus nephritis stage III previously biopsied. She also has a history of an extensive DVT and apparently pulmonary embolism in September 2020 and she has remained on Xarelto since then. Finally she has a history anticardiolipin antibody positive and positive lupus anticoagulant She tells me that she noted 2 skin lesions on the right lateral calf the larger one proximally and the smaller one medially sometime in January. She apparently was seen in the ER at the time felt to possibly have a current DVT in the leg. Apparently that was worked up and was negative. She was admitted to hospital from 3/26 through 3/29 and was felt to have a flare of her lupus. She was given Augmentin apparently for possible cellulitis in the right lower extremity. She was back in the ER on 3/29 the day she was discharged with pain in the leg. The patient states that the areas currently seen opened in February. She would been putting alcohol on it I think Dr. [name] recently gave her mupirocin. Past medical history; the patient is a non-smoker. She has never been pregnant. As noted she has a significant history of venous thromboembolic disease although her recent DVT rule out was negative. She has hypertension, lupus nephritis Raynaud's syndrome and depression. She has Medicaid pending which we will have to keep in mind when we prescribe either drugs or wound care products. There currently paying for a lot of this out-of-pocket with help from family members ABI in our clinic was 0.89 on the right 01/13/2020 the patient has 2 areas on the right lateral calf with most  significant one is proximal and a very tiny one down towards the ankle. 5/10; ABI on the right at 0.92 however TBI at 0.37 and only monophasic and biphasic waveforms. On the left waveforms were triphasic ABI at 1.12 TBI of 0.54. Although the suggest mild lower extremity arterial disease this woman is only 24 years old I would like her seen by vascular surgery. She has lupus and has had a history of DVT I remember correctly she has anticardiolipin antibody and positive lupus anticoagulant. If we can prove she has arterial disease I will . talk to her rheumatologist about whether she needs to see hematology. I have not given up on the idea of biopsying this although she is on Xarelto. Objective Constitutional Patient is hypertensive.. Pulse regular and within target range for patient.Marland Kitchen Respirations regular, non-labored and within target range.. Temperature is normal and within the target range for the patient.Marland Kitchen Appears in no distress. Vitals Time Taken: 9:57 AM, Height: 63 in, Weight: 152 lbs, BMI: 26.9, Temperature: 97.8 F, Pulse: 108 bpm, Respiratory Rate: 17 breaths/min, Blood Pressure: 144/98 mmHg. Cardiovascular Pedal pulses are absent on the right. General Notes: Wound exam; the patient has 2 open wounds on the right lateral calf. The most proximal 1 is most significant a small 1 distally. Her peripheral pulses are difficult to feel. There is no rash. Integumentary (Hair, Skin) No rashes are seen. Wound #1 status is Open. Original cause of wound was Gradually Appeared. The wound is located on the Right,Lateral Lower Leg. The wound measures 1.5cm length x 1.5cm width x 0.7cm depth; 1.767cm^2 area and 1.237cm^3 volume. There is Fat Layer (Subcutaneous Tissue) Exposed exposed. Tunneling has been noted at 11:00 with a maximum distance of 0.5cm. Undermining begins at 3:00 and ends at 5:00 with a maximum distance of 1.2cm. There is a medium  amount of serosanguineous drainage noted. The wound  margin is well defined and not attached to the wound base. There is large (67-100%) red, pink granulation within the wound bed. There is a small (1-33%) amount of necrotic tissue within the wound bed including Adherent Slough. Wound #2 status is Open. Original cause of wound was Gradually Appeared. The wound is located on the Right,Distal,Lateral Lower Leg. The wound measures 0.1cm length x 0.1cm width x 0.1cm depth; 0.008cm^2 area and 0.001cm^3 volume. There is no tunneling or undermining noted. There is a none present amount of drainage noted. The wound margin is distinct with the outline attached to the wound base. There is no granulation within the wound bed. There is no necrotic tissue within the wound bed. Assessment Active Problems ICD-10 Non-pressure chronic ulcer of right calf with fat layer exposed Non-pressure chronic ulcer of right calf limited to breakdown of skin Other organ or system involvement in systemic lupus erythematosus Raynaud's syndrome without gangrene Plan Follow-up Appointments: Return Appointment in 1 week. Dressing Change Frequency: Wound #1 Right,Lateral Lower Leg: Change Dressing every other day. Wound #2 Right,Distal,Lateral Lower Leg: Change Dressing every other day. Wound Cleansing: Wound #1 Right,Lateral Lower Leg: May shower and wash wound with soap and water. - on days that dressing is changed Wound #2 Right,Distal,Lateral Lower Leg: May shower and wash wound with soap and water. - on days that dressing is changed Primary Wound Dressing: Wound #1 Right,Lateral Lower Leg: Silver Collagen - moisten with hydrogel or KY jelly Wound #2 Right,Distal,Lateral Lower Leg: Silver Collagen - moisten with hydrogel or KY jelly Secondary Dressing: Wound #1 Right,Lateral Lower Leg: Foam Border - or bandaid Wound #2 Right,Distal,Lateral Lower Leg: Foam Border - or bandaid 1. I would like the patient to be seen by vascular surgery before anything else is done.  Does she need an angiogram. She also has lupus nephritis.... 2. If she has peripheral arterial disease I wonder if she now defines for lupus anticoagulant. I will try to call her rheumatologist if that is the case. 3. I am not opposed to biopsying this although she is on Xarelto. Again I would like to talk to the rheumatologist before we consider this Electronic Signature(s) Signed: 01/21/2020 8:11:21 AM By: Linton Ham MD Entered By: Linton Ham on 01/20/2020 10:38:32 -------------------------------------------------------------------------------- SuperBill Details Patient Name: Date of Service: Penelope Coop. 01/20/2020 Medical Record Number: [id number] Patient Account Number: [id number] Date of Birth/Sex: Treating RN: 09-Mar-1996 (24 y.o. [name] [name] Primary Care Provider: Merita Norton ULT, PRO V IDER Other Clinician: Referring Provider: Treating Provider/Extender: Cheree Ditto in Treatment: 2 Diagnosis Coding ICD-10 Codes Code Description [phone] Non-pressure chronic ulcer of right calf with fat layer exposed L97.211 Non-pressure chronic ulcer of right calf limited to breakdown of skin M32.19 Other organ or system involvement in systemic lupus erythematosus I73.00 Raynaud's syndrome without gangrene Facility Procedures CPT4 Code: [id number] Description: 99214 - WOUND CARE VISIT-LEV 4 EST PT Modifier: Quantity: 1 Physician Procedures : CPT4 Code Description Modifier [id number] - WC PHYS LEVEL 3 - EST PT ICD-10 Diagnosis Description L97.212 Non-pressure chronic ulcer of right calf with fat layer exposed L97.211 Non-pressure chronic ulcer of right calf limited to breakdown of skin  M32.19 Other organ or system involvement in systemic lupus erythematosus Quantity: 1 Electronic Signature(s) Signed: 01/21/2020 8:11:21 AM By: Linton Ham MD Entered By: Linton Ham on 01/20/2020 10:38:54

## 2020-01-21 NOTE — Progress Notes (Signed)
Willingham, Muniz ([id number]) Visit Report for 01/20/2020 Arrival Information Details Patient Name: Date of Service: KHIARA, OHERN 01/20/2020 9:45 A M Medical Record Number: [id number] Patient Account Number: [id number] Date of Birth/Sex: Treating RN: 1996-03-24 (24 y.o. Hollie Salk, Larene Beach Primary Care Alonda Weaber: Merita Norton ULT, PRO V IDER Other Clinician: Referring Caden Fatica: Treating Maureen Duesing/Extender: Cheree Ditto in Treatment: 2 Visit Information History Since Last Visit Added or deleted any medications: No Patient Arrived: Ambulatory Any new allergies or adverse reactions: No Arrival Time: 09:56 Had a fall or experienced change in No Accompanied By: family member activities of daily living that may affect Transfer Assistance: None risk of falls: Patient Identification Verified: Yes Signs or symptoms of abuse/neglect since last visito No Secondary Verification Process Completed: Yes Hospitalized since last visit: No Patient Requires Transmission-Based Precautions: No Implantable device outside of the clinic excluding No Patient Has Alerts: No cellular tissue based products placed in the center since last visit: Has Dressing in Place as Prescribed: Yes Pain Present Now: Yes Electronic Signature(s) Signed: 01/21/2020 5:14:55 PM By: Kela Millin Entered By: Kela Millin on 01/20/2020 09:57:19 -------------------------------------------------------------------------------- Clinic Level of Care Assessment Details Patient Name: Date of Service: LYNZEY, STALLCUP 01/20/2020 9:45 A M Medical Record Number: [id number] Patient Account Number: [id number] Date of Birth/Sex: Treating RN: 1996/07/10 (24 y.o. [name] Primary Care Jagdeep Ancheta: Merita Norton ULT, PRO V IDER Other Clinician: Referring Vester Balthazor: Treating Naheim Burgen/Extender: Cheree Ditto in Treatment: 2 Clinic Level of Care Assessment Items TOOL 4 Quantity Score X- 1 0 Use when only an EandM is performed on  FOLLOW-UP visit ASSESSMENTS - Nursing Assessment / Reassessment X- 1 10 Reassessment of Co-morbidities (includes updates in patient status) X- 1 5 Reassessment of Adherence to Treatment Plan ASSESSMENTS - Wound and Skin A ssessment / Reassessment []  - 0 Simple Wound Assessment / Reassessment - one wound X- 2 5 Complex Wound Assessment / Reassessment - multiple wounds []  - 0 Dermatologic / Skin Assessment (not related to wound area) ASSESSMENTS - Focused Assessment []  - 0 Circumferential Edema Measurements - multi extremities []  - 0 Nutritional Assessment / Counseling / Intervention X- 1 5 Lower Extremity Assessment (monofilament, tuning fork, pulses) []  - 0 Peripheral Arterial Disease Assessment (using hand held doppler) ASSESSMENTS - Ostomy and/or Continence Assessment and Care []  - 0 Incontinence Assessment and Management []  - 0 Ostomy Care Assessment and Management (repouching, etc.) PROCESS - Coordination of Care X - Simple Patient / Family Education for ongoing care 1 15 []  - 0 Complex (extensive) Patient / Family Education for ongoing care X- 1 10 Staff obtains Programmer, systems, Records, T Results / Process Orders est []  - 0 Staff telephones HHA, Nursing Homes / Clarify orders / etc []  - 0 Routine Transfer to another Facility (non-emergent condition) []  - 0 Routine Hospital Admission (non-emergent condition) []  - 0 New Admissions / Biomedical engineer / Ordering NPWT Apligraf, etc. , []  - 0 Emergency Hospital Admission (emergent condition) X- 1 10 Simple Discharge Coordination []  - 0 Complex (extensive) Discharge Coordination PROCESS - Special Needs []  - 0 Pediatric / Minor Patient Management []  - 0 Isolation Patient Management []  - 0 Hearing / Language / Visual special needs []  - 0 Assessment of Community assistance (transportation, D/C planning, etc.) []  - 0 Additional assistance / Altered mentation []  - 0 Support Surface(s) Assessment (bed, cushion,  seat, etc.) INTERVENTIONS - Wound Cleansing / Measurement []  - 0 Simple Wound Cleansing - one wound X- 2 5 Complex Wound Cleansing - multiple  wounds X- 1 5 Wound Imaging (photographs - any number of wounds) []  - 0 Wound Tracing (instead of photographs) []  - 0 Simple Wound Measurement - one wound X- 2 5 Complex Wound Measurement - multiple wounds INTERVENTIONS - Wound Dressings X - Small Wound Dressing one or multiple wounds 2 10 []  - 0 Medium Wound Dressing one or multiple wounds []  - 0 Large Wound Dressing one or multiple wounds X- 1 5 Application of Medications - topical []  - 0 Application of Medications - injection INTERVENTIONS - Miscellaneous []  - 0 External ear exam []  - 0 Specimen Collection (cultures, biopsies, blood, body fluids, etc.) []  - 0 Specimen(s) / Culture(s) sent or taken to Lab for analysis []  - 0 Patient Transfer (multiple staff / Civil Service fast streamer / Similar devices) []  - 0 Simple Staple / Suture removal (25 or less) []  - 0 Complex Staple / Suture removal (26 or more) []  - 0 Hypo / Hyperglycemic Management (close monitor of Blood Glucose) []  - 0 Ankle / Brachial Index (ABI) - do not check if billed separately X- 1 5 Vital Signs Has the patient been seen at the hospital within the last three years: Yes Total Score: 120 Level Of Care: New/Established - Level 4 Electronic Signature(s) Signed: 01/21/2020 5:17:28 PM By: Levan Hurst RN, BSN Entered By: Levan Hurst on 01/20/2020 10:28:25 -------------------------------------------------------------------------------- Encounter Discharge Information Details Patient Name: Date of Service: [name] 01/20/2020 9:45 A M Medical Record Number: [id number] Patient Account Number: [id number] Date of Birth/Sex: Treating RN: [birth year] (24 y.o. [name] Primary Care Valentino Saavedra: Merita Norton ULT, PRO V IDER Other Clinician: Referring Israel Wunder: Treating Jazziel Fitzsimmons/Extender: Cheree Ditto in Treatment:  2 Encounter Discharge Information Items Discharge Condition: Stable Ambulatory Status: Ambulatory Discharge Destination: Home Transportation: Private Auto Accompanied By: family member Schedule Follow-up Appointment: Yes Clinical Summary of Care: Electronic Signature(s) Signed: 01/20/2020 5:18:32 PM By: Deon Pilling Entered By: Deon Pilling on 01/20/2020 10:41:12 -------------------------------------------------------------------------------- Lower Extremity Assessment Details Patient Name: Date of Service: RICHARDEAN, AYDIN 01/20/2020 9:45 A M Medical Record Number: [id number] Patient Account Number: [id number] Date of Birth/Sex: Treating RN: 08-19-1996 (24 y.o. Clearnce Sorrel Primary Care Addy Mcmannis: Merita Norton ULT, PRO V IDER Other Clinician: Referring Kyleen Villatoro: Treating Arvind Mexicano/Extender: Cheree Ditto in Treatment: 2 Edema Assessment Assessed: [Left: No] [Right: No] Edema: [Left: Ye] [Right: s] Calf Left: Right: Point of Measurement: 39 cm From Medial Instep cm 33.3 cm Ankle Left: Right: Point of Measurement: 9 cm From Medial Instep cm 21.5 cm Vascular Assessment Pulses: Dorsalis Pedis Palpable: [Right:Yes] Electronic Signature(s) Signed: 01/21/2020 5:14:55 PM By: Kela Millin Entered By: Kela Millin on 01/20/2020 09:59:26 -------------------------------------------------------------------------------- Multi Wound Chart Details Patient Name: Date of Service: [name] 01/20/2020 9:45 A M Medical Record Number: [id number] Patient Account Number: [id number] Date of Birth/Sex: Treating RN: [birth year] (24 y.o. [name] Primary Care Talayia Hjort: Merita Norton ULT, PRO Ave Filter Other Clinician: Referring Shanine Kreiger: Treating Teagyn Fishel/Extender: Cheree Ditto in Treatment: 2 Vital Signs Height(in): 63 Pulse(bpm): 108 Weight(lbs): 152 Blood Pressure(mmHg): 144/98 Body Mass Index(BMI): 27 Temperature(F): 97.8 Respiratory Rate(breaths/min):  17 Photos: [1:No Photos Right, Lateral Lower Leg] [2:No Photos Right, Distal, Lateral Lower Leg] [N/A:N/A N/A] Wound Location: [1:Gradually Appeared] [2:Gradually Appeared] [N/A:N/A] Wounding Event: [1:Inflammatory] [2:Inflammatory] [N/A:N/A] Primary Etiology: [1:Hypertension, Lupus Erythematosus,] [2:Hypertension, Lupus Erythematosus,] [N/A:N/A] Comorbid History: [1:Raynauds 10/14/2019] [2:Raynauds 10/14/2019] [N/A:N/A] Date Acquired: [1:2] [2:2] [N/A:N/A] Weeks of Treatment: [1:Open] [2:Open] [N/A:N/A] Wound Status: [1:1.5x1.5x0.7] [2:0.1x0.1x0.1] [N/A:N/A] Measurements L x W x D (cm) [1:1.767] [2:0.008] [  N/A:N/A] A (cm) : rea [1:1.237] [2:0.001] [N/A:N/A] Volume (cm) : [1:-15.30%] [2:83.00%] [N/A:N/A] % Reduction in A rea: [1:-34.60%] [2:88.90%] [N/A:N/A] % Reduction in Volume: [1:11] Position 1 (o'clock): [1:0.5] Maximum Distance 1 (cm): [1:3] Starting Position 1 (o'clock): [1:5] Ending Position 1 (o'clock): [1:1.2] Maximum Distance 1 (cm): [1:Yes] [2:No] [N/A:N/A] Tunneling: [1:Yes] [2:No] [N/A:N/A] Undermining: [1:Full Thickness Without Exposed] [2:Full Thickness Without Exposed] [N/A:N/A] Classification: [1:Support Structures Medium] [2:Support Structures None Present] [N/A:N/A] Exudate Amount: [1:Serosanguineous] [2:N/A] [N/A:N/A] Exudate Type: [1:red, brown] [2:N/A] [N/A:N/A] Exudate Color: [1:Well defined, not attached] [2:Distinct, outline attached] [N/A:N/A] Wound Margin: [1:Large (67-100%)] [2:None Present (0%)] [N/A:N/A] Granulation Amount: [1:Red, Pink] [2:N/A] [N/A:N/A] Granulation Quality: [1:Small (1-33%)] [2:None Present (0%)] [N/A:N/A] Necrotic Amount: [1:Fat Layer (Subcutaneous Tissue)] [2:Fascia: No] [N/A:N/A] Exposed Structures: [1:Exposed: Yes Fascia: No Tendon: No Muscle: No Joint: No Bone: No None] [2:Fat Layer (Subcutaneous Tissue) Exposed: No Tendon: No Muscle: No Joint: No Bone: No Medium (34-66%)] [N/A:N/A] Treatment Notes Electronic Signature(s) Signed:  01/21/2020 8:11:21 AM By: Linton Ham MD Signed: 01/21/2020 5:17:28 PM By: Levan Hurst RN, BSN Entered By: Linton Ham on 01/20/2020 10:34:07 -------------------------------------------------------------------------------- Multi-Disciplinary Care Plan Details Patient Name: Date of Service: [name] 01/20/2020 9:45 A M Medical Record Number: [id number] Patient Account Number: [id number] Date of Birth/Sex: Treating RN: 09-03-1996 (24 y.o. [name] Primary Care Abygayle Deltoro: Merita Norton ULT, PRO Ave Filter Other Clinician: Referring Bronda Alfred: Treating Taim Wurm/Extender: Cheree Ditto in Treatment: 2 Active Inactive Wound/Skin Impairment Nursing Diagnoses: Impaired tissue integrity Knowledge deficit related to ulceration/compromised skin integrity Goals: Patient/caregiver will verbalize understanding of skin care regimen Date Initiated: 01/06/2020 Target Resolution Date: 02/07/2020 Goal Status: Active Ulcer/skin breakdown will have a volume reduction of 30% by week 4 Date Initiated: 01/06/2020 Target Resolution Date: 02/07/2020 Goal Status: Active Interventions: Assess patient/caregiver ability to obtain necessary supplies Assess patient/caregiver ability to perform ulcer/skin care regimen upon admission and as needed Assess ulceration(s) every visit Provide education on ulcer and skin care Notes: Electronic Signature(s) Signed: 01/21/2020 5:17:28 PM By: Levan Hurst RN, BSN Entered By: Levan Hurst on 01/20/2020 10:16:42 -------------------------------------------------------------------------------- Pain Assessment Details Patient Name: Date of Service: [name] 01/20/2020 9:45 A M Medical Record Number: [id number] Patient Account Number: [id number] Date of Birth/Sex: Treating RN: May 24, 1996 (24 y.o. Clearnce Sorrel Primary Care Akelia Husted: Merita Norton ULT, PRO Ave Filter Other Clinician: Referring Gorje Iyer: Treating Maisey Deandrade/Extender: Cheree Ditto in Treatment: 2 Active Problems Location of Pain Severity and Description of Pain Patient Has Paino Yes Site Locations Pain Location: Pain Location: Pain in Ulcers With Dressing Change: Yes Duration of the Pain. Constant / Intermittento Constant Rate the pain. Current Pain Level: 6 Worst Pain Level: 8 Least Pain Level: 5 Tolerable Pain Level: 5 Character of Pain Describe the Pain: Aching, Burning Pain Management and Medication Current Pain Management: Electronic Signature(s) Signed: 01/21/2020 5:14:55 PM By: Kela Millin Entered By: Kela Millin on 01/20/2020 09:57:51 -------------------------------------------------------------------------------- Patient/Caregiver Education Details Patient Name: Date of Service: Penelope Coop 5/10/2021andnbsp9:45 A M Medical Record Number: [id number] Patient Account Number: [id number] Date of Birth/Gender: Treating RN: May 01, 1996 (24 y.o. [name] Primary Care Physician: Merita Norton ULT, PRO V IDER Other Clinician: Referring Physician: Treating Physician/Extender: Cheree Ditto in Treatment: 2 Education Assessment Education Provided To: Patient Education Topics Provided Wound/Skin Impairment: Methods: Explain/Verbal Responses: State content correctly Electronic Signature(s) Signed: 01/21/2020 5:17:28 PM By: Levan Hurst RN, BSN Entered By: Levan Hurst on 01/20/2020 10:16:55 -------------------------------------------------------------------------------- Wound Assessment Details Patient Name: Date of Service: [name] 01/20/2020  9:45 A M Medical Record Number: [id number] Patient Account Number: [id number] Date of Birth/Sex: Treating RN: 02-01-1996 (24 y.o. Clearnce Sorrel Primary Care Christiana Gurevich: Merita Norton ULT, PRO V IDER Other Clinician: Referring Veto Macqueen: Treating Jessee Newnam/Extender: Cheree Ditto in Treatment: 2 Wound Status Wound Number: 1 Primary Etiology:  Inflammatory Wound Location: Right, Lateral Lower Leg Wound Status: Open Wounding Event: Gradually Appeared Comorbid History: Hypertension, Lupus Erythematosus, Raynauds Date Acquired: 10/14/2019 Weeks Of Treatment: 2 Clustered Wound: No Photos Photo Uploaded By: Mikeal Hawthorne on 01/21/2020 10:46:43 Wound Measurements Length: (cm) 1.5 Width: (cm) 1.5 Depth: (cm) 0.7 Area: (cm) 1.767 Volume: (cm) 1.237 % Reduction in Area: -15.3% % Reduction in Volume: -34.6% Epithelialization: None Tunneling: Yes Position (o'clock): 11 Maximum Distance: (cm) 0.5 Undermining: Yes Starting Position (o'clock): 3 Ending Position (o'clock): 5 Maximum Distance: (cm) 1.2 Wound Description Classification: Full Thickness Without Exposed Support Structu Wound Margin: Well defined, not attached Exudate Amount: Medium Exudate Type: Serosanguineous Exudate Color: red, brown res Foul Odor After Cleansing: No Slough/Fibrino Yes Wound Bed Granulation Amount: Large (67-100%) Exposed Structure Granulation Quality: Red, Pink Fascia Exposed: No Necrotic Amount: Small (1-33%) Fat Layer (Subcutaneous Tissue) Exposed: Yes Necrotic Quality: Adherent Slough Tendon Exposed: No Muscle Exposed: No Joint Exposed: No Bone Exposed: No Treatment Notes Wound #1 (Right, Lateral Lower Leg) 1. Cleanse With Wound Cleanser 2. Periwound Care Skin Prep 3. Primary Dressing Applied Collegen AG Hydrogel or K-Y Jelly 4. Secondary Dressing Foam Border Dressing 5. Secured With Office manager) Signed: 01/21/2020 5:14:55 PM By: Kela Millin Signed: 01/21/2020 5:17:28 PM By: Levan Hurst RN, BSN Entered By: Levan Hurst on 01/20/2020 10:32:31 -------------------------------------------------------------------------------- Wound Assessment Details Patient Name: Date of Service: [name] 01/20/2020 9:45 A M Medical Record Number: [id number] Patient Account Number:  [id number] Date of Birth/Sex: Treating RN: 07/08/1996 (24 y.o. Clearnce Sorrel Primary Care Lerae Langham: Merita Norton ULT, PRO V IDER Other Clinician: Referring Treyvon Blahut: Treating Oluwatimileyin Vivier/Extender: Cheree Ditto in Treatment: 2 Wound Status Wound Number: 2 Primary Etiology: Inflammatory Wound Location: Right, Distal, Lateral Lower Leg Wound Status: Open Wounding Event: Gradually Appeared Comorbid History: Hypertension, Lupus Erythematosus, Raynauds Date Acquired: 10/14/2019 Weeks Of Treatment: 2 Clustered Wound: No Photos Photo Uploaded By: Mikeal Hawthorne on 01/21/2020 10:45:25 Wound Measurements Length: (cm) 0.1 Width: (cm) 0.1 Depth: (cm) 0.1 Area: (cm) 0.008 Volume: (cm) 0.001 % Reduction in Area: 83% % Reduction in Volume: 88.9% Epithelialization: Medium (34-66%) Tunneling: No Undermining: No Wound Description Classification: Full Thickness Without Exposed Support Structures Wound Margin: Distinct, outline attached Exudate Amount: None Present Foul Odor After Cleansing: No Slough/Fibrino No Wound Bed Granulation Amount: None Present (0%) Exposed Structure Necrotic Amount: None Present (0%) Fascia Exposed: No Fat Layer (Subcutaneous Tissue) Exposed: No Tendon Exposed: No Muscle Exposed: No Joint Exposed: No Bone Exposed: No Treatment Notes Wound #2 (Right, Distal, Lateral Lower Leg) 1. Cleanse With Wound Cleanser 2. Periwound Care Skin Prep 3. Primary Dressing Applied Collegen AG Hydrogel or K-Y Jelly 4. Secondary Dressing Foam Border Dressing 5. Secured With Office manager) Signed: 01/21/2020 5:14:55 PM By: Kela Millin Entered By: Kela Millin on 01/20/2020 10:01:44 -------------------------------------------------------------------------------- Vitals Details Patient Name: Date of Service: [name] 01/20/2020 9:45 A M Medical Record Number: [id number] Patient Account Number: [id number] Date of  Birth/Sex: Treating RN: 07-13-1996 (24 y.o. Clearnce Sorrel Primary Care Ariyon Mittleman: Merita Norton ULT, PRO Ave Filter Other Clinician: Referring Tiare Rohlman: Treating Hagan Vanauken/Extender: Cheree Ditto in Treatment: 2 Vital Signs Time Taken: 09:57 Temperature (F): 97.8 Height (  in): 63 Pulse (bpm): 108 Weight (lbs): 152 Respiratory Rate (breaths/min): 17 Body Mass Index (BMI): 26.9 Blood Pressure (mmHg): 144/98 Reference Range: 80 - 120 mg / dl Electronic Signature(s) Signed: 01/21/2020 5:14:55 PM By: Kela Millin Entered By: Kela Millin on 01/20/2020 10:00:05

## 2020-01-22 NOTE — Progress Notes (Signed)
Renninger, [place] ([id number]) Visit Report for 01/06/2020 Allergy List Details Patient Name: Date of Service: [name] [name], [name] [name] 01/06/2020 9:00 A M Medical Record Number: [id number] Patient Account Number: [id number] Date of Birth/Sex: Treating RN: 06/12/1996 (24 y.o. [name] Primary Care Helem Reesor: Merita Norton ULT, PRO Ave Filter Other Clinician: Referring Bailen Geffre: Treating Serafin Decatur/Extender: Cheree Ditto in Treatment: 0 Allergies Active Allergies latex hydroxychloroquine Allergy Notes Electronic Signature(s) Signed: 01/22/2020 9:09:16 AM By: Sandre Kitty Entered By: Sandre Kitty on 01/06/2020 09:34:13 -------------------------------------------------------------------------------- Arrival Information Details Patient Name: Date of Service: [name] Jeans MIE L. 01/06/2020 9:00 A M Medical Record Number: [id number] Patient Account Number: [id number] Date of Birth/Sex: Treating RN: 02/17/1996 (24 y.o. [name] Primary Care Olesya Wike: Merita Norton ULT, PRO V IDER Other Clinician: Referring Jguadalupe Opiela: Treating Ellinore Merced/Extender: Cheree Ditto in Treatment: 0 Visit Information Patient Arrived: Ambulatory Arrival Time: 09:23 Accompanied By: mother Transfer Assistance: None Patient Identification Verified: Yes Secondary Verification Process Completed: Yes Electronic Signature(s) Signed: 01/22/2020 9:09:16 AM By: Sandre Kitty Entered By: Sandre Kitty on 01/06/2020 09:23:54 -------------------------------------------------------------------------------- Clinic Level of Care Assessment Details Patient Name: Date of Service: [name] [name] 01/06/2020 9:00 A M Medical Record Number: [id number] Patient Account Number: [id number] Date of Birth/Sex: Treating RN: 07-Mar-1996 (24 y.o. [name] Primary Care Toleen Lachapelle: Merita Norton ULT, PRO Ave Filter Other Clinician: Referring Ivo Moga: Treating Jenniferann Stuckert/Extender: Cheree Ditto in Treatment: 0 Clinic Level of Care  Assessment Items TOOL 2 Quantity Score X- 1 0 Use when only an EandM is performed on the INITIAL visit ASSESSMENTS - Nursing Assessment / Reassessment X- 1 20 General Physical Exam (combine w/ comprehensive assessment (listed just below) when performed on new pt. evals) X- 1 25 Comprehensive Assessment (HX, ROS, Risk Assessments, Wounds Hx, etc.) ASSESSMENTS - Wound and Skin A ssessment / Reassessment []  - 0 Simple Wound Assessment / Reassessment - one wound X- 2 5 Complex Wound Assessment / Reassessment - multiple wounds []  - 0 Dermatologic / Skin Assessment (not related to wound area) ASSESSMENTS - Ostomy and/or Continence Assessment and Care []  - 0 Incontinence Assessment and Management []  - 0 Ostomy Care Assessment and Management (repouching, etc.) PROCESS - Coordination of Care X - Simple Patient / Family Education for ongoing care 1 15 []  - 0 Complex (extensive) Patient / Family Education for ongoing care X- 1 10 Staff obtains Programmer, systems, Records, T Results / Process Orders est []  - 0 Staff telephones HHA, Nursing Homes / Clarify orders / etc []  - 0 Routine Transfer to another Facility (non-emergent condition) []  - 0 Routine Hospital Admission (non-emergent condition) X- 1 15 New Admissions / Biomedical engineer / Ordering NPWT Apligraf, etc. , []  - 0 Emergency Hospital Admission (emergent condition) X- 1 10 Simple Discharge Coordination []  - 0 Complex (extensive) Discharge Coordination PROCESS - Special Needs []  - 0 Pediatric / Minor Patient Management []  - 0 Isolation Patient Management []  - 0 Hearing / Language / Visual special needs []  - 0 Assessment of Community assistance (transportation, D/C planning, etc.) []  - 0 Additional assistance / Altered mentation []  - 0 Support Surface(s) Assessment (bed, cushion, seat, etc.) INTERVENTIONS - Wound Cleansing / Measurement X- 1 5 Wound Imaging (photographs - any number of wounds) []  - 0 Wound Tracing  (instead of photographs) []  - 0 Simple Wound Measurement - one wound X- 2 5 Complex Wound Measurement - multiple wounds []  - 0 Simple Wound Cleansing - one wound X- 2 5 Complex Wound Cleansing - multiple wounds INTERVENTIONS - Wound  Dressings X - Small Wound Dressing one or multiple wounds 1 10 []  - 0 Medium Wound Dressing one or multiple wounds []  - 0 Large Wound Dressing one or multiple wounds []  - 0 Application of Medications - injection INTERVENTIONS - Miscellaneous []  - 0 External ear exam []  - 0 Specimen Collection (cultures, biopsies, blood, body fluids, etc.) []  - 0 Specimen(s) / Culture(s) sent or taken to Lab for analysis []  - 0 Patient Transfer (multiple staff / Civil Service fast streamer / Similar devices) []  - 0 Simple Staple / Suture removal (25 or less) []  - 0 Complex Staple / Suture removal (26 or more) []  - 0 Hypo / Hyperglycemic Management (close monitor of Blood Glucose) X- 1 15 Ankle / Brachial Index (ABI) - do not check if billed separately Has the patient been seen at the hospital within the last three years: Yes Total Score: 155 Level Of Care: New/Established - Level 4 Electronic Signature(s) Signed: 01/06/2020 5:33:33 PM By: Levan Hurst RN, BSN Entered By: Levan Hurst on 01/06/2020 10:41:52 -------------------------------------------------------------------------------- Encounter Discharge Information Details Patient Name: Date of Service: [name] Jeans MIE L. 01/06/2020 9:00 A M Medical Record Number: [id number] Patient Account Number: [id number] Date of Birth/Sex: Treating RN: 05/28/1996 (24 y.o. [name] Primary Care Ulysee Fyock: Merita Norton ULT, PRO Ave Filter Other Clinician: Referring Jeffie Spivack: Treating Myrtice Lowdermilk/Extender: Cheree Ditto in Treatment: 0 Encounter Discharge Information Items Discharge Condition: Stable Ambulatory Status: Ambulatory Discharge Destination: Home Transportation: Private Auto Accompanied By: family member Schedule  Follow-up Appointment: Yes Clinical Summary of Care: Electronic Signature(s) Signed: 01/06/2020 5:14:33 PM By: Deon Pilling Entered By: Deon Pilling on 01/06/2020 10:53:23 -------------------------------------------------------------------------------- Lower Extremity Assessment Details Patient Name: Date of Service: CAMALA, BONIFAS MIE L. 01/06/2020 9:00 A M Medical Record Number: [id number] Patient Account Number: [id number] Date of Birth/Sex: Treating RN: 1995/11/17 (24 y.o. [name] Primary Care Norma Montemurro: Merita Norton ULT, PRO V IDER Other Clinician: Referring Jozette Castrellon: Treating Yuchen Fedor/Extender: Cheree Ditto in Treatment: 0 Edema Assessment Assessed: [Left: No] [Right: No] E[Left: dema] [Right: :] Calf Left: Right: Point of Measurement: 39 cm From Medial Instep cm 34 cm Ankle Left: Right: Point of Measurement: 9 cm From Medial Instep cm 20.5 cm Vascular Assessment Blood Pressure: Brachial: [Right:112] Ankle: [Right:Posterior Tibial: 100 0.89] Electronic Signature(s) Signed: 01/06/2020 5:33:33 PM By: Levan Hurst RN, BSN Signed: 01/22/2020 9:09:16 AM By: Sandre Kitty Entered By: Sandre Kitty on 01/06/2020 10:00:35 -------------------------------------------------------------------------------- Multi Wound Chart Details Patient Name: Date of Service: [name] Jeans MIE L. 01/06/2020 9:00 A M Medical Record Number: [id number] Patient Account Number: [id number] Date of Birth/Sex: Treating RN: [birth year] (24 y.o. [name] Primary Care Sabreen Kitchen: Merita Norton ULT, PRO V IDER Other Clinician: Referring Marshall Kampf: Treating Virlan Kempker/Extender: Cheree Ditto in Treatment: 0 Vital Signs Height(in): 63 Pulse(bpm): 120 Weight(lbs): 152 Blood Pressure(mmHg): 118/93 Body Mass Index(BMI): 27 Temperature(F): 98.4 Respiratory Rate(breaths/min): 20 Photos: [1:No Photos Right, Lateral Lower Leg] [2:No Photos Right, Distal, Lateral Lower Leg] [N/A:N/A N/A] Wound  Location: [1:Gradually Appeared] [2:Gradually Appeared] [N/A:N/A] Wounding Event: [1:Inflammatory] [2:Inflammatory] [N/A:N/A] Primary Etiology: [1:Hypertension, Lupus Erythematosus,] [2:Hypertension, Lupus Erythematosus,] [N/A:N/A] Comorbid History: [1:Raynauds 10/14/2019] [2:Raynauds 10/14/2019] [N/A:N/A] Date Acquired: [1:0] [2:0] [N/A:N/A] Weeks of Treatment: [1:Open] [2:Open] [N/A:N/A] Wound Status: [1:1.3x1.5x0.6] [2:0.3x0.2x0.2] [N/A:N/A] Measurements L x W x D (cm) [1:1.532] [2:0.047] [N/A:N/A] A (cm) : rea [1:0.919] [2:0.009] [N/A:N/A] Volume (cm) : [1:Full Thickness Without Exposed] [2:Full Thickness Without Exposed] [N/A:N/A] Classification: [1:Support Structures Medium] [2:Support Structures Small] [N/A:N/A] Exudate Amount: [1:Serosanguineous] [2:Serosanguineous] [N/A:N/A] Exudate Type: [1:red, brown] [2:red, brown] [N/A:N/A] Exudate Color: [  1:Well defined, not attached] [2:Well defined, not attached] [N/A:N/A] Wound Margin: [1:Medium (34-66%)] [2:Medium (34-66%)] [N/A:N/A] Granulation Amount: [1:Red] [2:Pink] [N/A:N/A] Granulation Quality: [1:Medium (34-66%)] [2:Medium (34-66%)] [N/A:N/A] Necrotic Amount: [1:Fat Layer (Subcutaneous Tissue)] [2:Fat Layer (Subcutaneous Tissue)] [N/A:N/A] Exposed Structures: [1:Exposed: Yes Fascia: No Tendon: No Muscle: No Joint: No Bone: No None] [2:Exposed: Yes Fascia: No Tendon: No Muscle: No Joint: No Bone: No None] [N/A:N/A] Treatment Notes Wound #1 (Right, Lateral Lower Leg) 1. Cleanse With Wound Cleanser 2. Periwound Care Skin Prep 3. Primary Dressing Applied Collegen AG Hydrogel or K-Y Jelly 4. Secondary Dressing Foam Border Dressing 5. Secured With Self Adhesive Bandage Notes explained the orders, dressings, frequency of change, and when to return to wound center. Wound #2 (Right, Distal, Lateral Lower Leg) 1. Cleanse With Wound Cleanser 2. Periwound Care Skin Prep 3. Primary Dressing Applied Collegen AG Hydrogel or K-Y  Jelly 4. Secondary Dressing Foam Border Dressing 5. Secured With Self Adhesive Bandage Notes explained the orders, dressings, frequency of change, and when to return to wound center. Electronic Signature(s) Signed: 01/06/2020 5:33:33 PM By: Levan Hurst RN, BSN Signed: 01/06/2020 5:40:04 PM By: Linton Ham MD Entered By: Linton Ham on 01/06/2020 12:07:49 -------------------------------------------------------------------------------- Multi-Disciplinary Care Plan Details Patient Name: Date of Service: [name] Jeans MIE L. 01/06/2020 9:00 A M Medical Record Number: [id number] Patient Account Number: [id number] Date of Birth/Sex: Treating RN: [birth year] (24 y.o. [name] Primary Care Yareliz Thorstenson: Merita Norton ULT, PRO Ave Filter Other Clinician: Referring Paetyn Pietrzak: Treating Zacherie Honeyman/Extender: Cheree Ditto in Treatment: 0 Active Inactive Wound/Skin Impairment Nursing Diagnoses: Impaired tissue integrity Knowledge deficit related to ulceration/compromised skin integrity Goals: Patient/caregiver will verbalize understanding of skin care regimen Date Initiated: 01/06/2020 Target Resolution Date: 02/07/2020 Goal Status: Active Ulcer/skin breakdown will have a volume reduction of 30% by week 4 Date Initiated: 01/06/2020 Target Resolution Date: 02/07/2020 Goal Status: Active Interventions: Assess patient/caregiver ability to obtain necessary supplies Assess patient/caregiver ability to perform ulcer/skin care regimen upon admission and as needed Assess ulceration(s) every visit Provide education on ulcer and skin care Notes: Electronic Signature(s) Signed: 01/06/2020 5:33:33 PM By: Levan Hurst RN, BSN Entered By: Levan Hurst on 01/06/2020 10:40:21 -------------------------------------------------------------------------------- Pain Assessment Details Patient Name: Date of Service: [name] Jeans MIE L. 01/06/2020 9:00 A M Medical Record Number: [id number] Patient Account  Number: [id number] Date of Birth/Sex: Treating RN: October 17, 1995 (24 y.o. [name] Primary Care Junelle Hashemi: Merita Norton ULT, PRO Ave Filter Other Clinician: Referring Hye Trawick: Treating Paizlee Kinder/Extender: Cheree Ditto in Treatment: 0 Active Problems Location of Pain Severity and Description of Pain Patient Has Paino Yes Site Locations With Dressing Change: Yes Duration of the Pain. Constant / Intermittento Intermittent How Long Does it Lasto Hours: Minutes: 10 Rate the pain. Current Pain Level: 3 Worst Pain Level: 6 Least Pain Level: 0 Tolerable Pain Level: 5 Character of Pain Describe the Pain: Aching Pain Management and Medication Current Pain Management: Medication: Yes Cold Application: No Rest: Yes Massage: No Activity: No T.E.N.S.: No Heat Application: No Leg drop or elevation: No Is the Current Pain Management Adequate: Inadequate How does your wound impact your activities of daily livingo Sleep: Yes Bathing: No Appetite: No Relationship With Others: No Bladder Continence: No Emotions: No Bowel Continence: No Work: No Toileting: No Drive: No Dressing: No Hobbies: No Electronic Signature(s) Signed: 01/06/2020 5:33:33 PM By: Levan Hurst RN, BSN Signed: 01/22/2020 9:09:16 AM By: Sandre Kitty Entered By: Sandre Kitty on 01/06/2020 09:52:15 -------------------------------------------------------------------------------- Patient/Caregiver Education Details Patient Name: Date of Service: [name] Jeans MIE L. 4/26/2021andnbsp9:00  A M Medical Record Number: [id number] Patient Account Number: [id number] Date of Birth/Gender: Treating RN: 1995/11/24 (24 y.o. [name] Primary Care Physician: Merita Norton ULT, PRO V IDER Other Clinician: Referring Physician: Treating Physician/Extender: Cheree Ditto in Treatment: 0 Education Assessment Education Provided To: Patient Education Topics Provided Wound/Skin Impairment: Methods:  Explain/Verbal Responses: State content correctly Electronic Signature(s) Signed: 01/06/2020 5:33:33 PM By: Levan Hurst RN, BSN Entered By: Levan Hurst on 01/06/2020 10:40:29 -------------------------------------------------------------------------------- Wound Assessment Details Patient Name: Date of Service: [name] Jeans MIE L. 01/06/2020 9:00 A M Medical Record Number: [id number] Patient Account Number: [id number] Date of Birth/Sex: Treating RN: 06-21-1996 (24 y.o. [name] Primary Care Sachin Ferencz: Merita Norton ULT, PRO V IDER Other Clinician: Referring Karri Kallenbach: Treating Sharyah Bostwick/Extender: Cheree Ditto in Treatment: 0 Wound Status Wound Number: 1 Primary Etiology: Inflammatory Wound Location: Right, Lateral Lower Leg Wound Status: Open Wounding Event: Gradually Appeared Comorbid History: Hypertension, Lupus Erythematosus, Raynauds Date Acquired: 10/14/2019 Weeks Of Treatment: 0 Clustered Wound: No Photos Photo Uploaded By: Mikeal Hawthorne on 01/07/2020 13:42:38 Wound Measurements Length: (cm) 1.3 Width: (cm) 1.5 Depth: (cm) 0.6 Area: (cm) 1.532 Volume: (cm) 0.919 % Reduction in Area: % Reduction in Volume: Epithelialization: None Tunneling: No Undermining: No Wound Description Classification: Full Thickness Without Exposed Support Structures Wound Margin: Well defined, not attached Exudate Amount: Medium Exudate Type: Serosanguineous Exudate Color: red, brown Foul Odor After Cleansing: No Slough/Fibrino Yes Wound Bed Granulation Amount: Medium (34-66%) Exposed Structure Granulation Quality: Red Fascia Exposed: No Necrotic Amount: Medium (34-66%) Fat Layer (Subcutaneous Tissue) Exposed: Yes Necrotic Quality: Adherent Slough Tendon Exposed: No Muscle Exposed: No Joint Exposed: No Bone Exposed: No Electronic Signature(s) Signed: 01/06/2020 5:33:33 PM By: Levan Hurst RN, BSN Signed: 01/22/2020 9:09:16 AM By: Sandre Kitty Entered By: Sandre Kitty on 01/06/2020 09:49:53 -------------------------------------------------------------------------------- Wound Assessment Details Patient Name: Date of Service: [name] Jeans MIE L. 01/06/2020 9:00 A M Medical Record Number: [id number] Patient Account Number: [id number] Date of Birth/Sex: Treating RN: 1996-04-18 (24 y.o. [name] Primary Care Cleona Doubleday: Merita Norton ULT, PRO V IDER Other Clinician: Referring Davone Shinault: Treating Latasia Silberstein/Extender: Cheree Ditto in Treatment: 0 Wound Status Wound Number: 2 Primary Etiology: Inflammatory Wound Location: Right, Distal, Lateral Lower Leg Wound Status: Open Wounding Event: Gradually Appeared Comorbid History: Hypertension, Lupus Erythematosus, Raynauds Date Acquired: 10/14/2019 Weeks Of Treatment: 0 Clustered Wound: No Photos Photo Uploaded By: Mikeal Hawthorne on 01/07/2020 13:42:38 Wound Measurements Length: (cm) 0.3 Width: (cm) 0.2 Depth: (cm) 0.2 Area: (cm) 0.047 Volume: (cm) 0.009 % Reduction in Area: % Reduction in Volume: Epithelialization: None Tunneling: No Undermining: No Wound Description Classification: Full Thickness Without Exposed Support Structures Wound Margin: Well defined, not attached Exudate Amount: Small Exudate Type: Serosanguineous Exudate Color: red, brown Foul Odor After Cleansing: No Slough/Fibrino Yes Wound Bed Granulation Amount: Medium (34-66%) Exposed Structure Granulation Quality: Pink Fascia Exposed: No Necrotic Amount: Medium (34-66%) Fat Layer (Subcutaneous Tissue) Exposed: Yes Necrotic Quality: Adherent Slough Tendon Exposed: No Muscle Exposed: No Joint Exposed: No Bone Exposed: No Electronic Signature(s) Signed: 01/06/2020 5:33:33 PM By: Levan Hurst RN, BSN Signed: 01/22/2020 9:09:16 AM By: Sandre Kitty Entered By: Sandre Kitty on 01/06/2020 09:51:14 -------------------------------------------------------------------------------- Bath Details Patient  Name: Date of Service: [name] Jeans MIE L. 01/06/2020 9:00 A M Medical Record Number: [id number] Patient Account Number: [id number] Date of Birth/Sex: Treating RN: 09-18-1995 (24 y.o. [name] Primary Care Issam Carlyon: Merita Norton ULT, PRO V IDER Other Clinician: Referring Nanami Whitelaw: Treating Mazal Ebey/Extender: Cheree Ditto in Treatment: 0 Vital Signs Time Taken: 09:24  Temperature (F): 98.4 Height (in): 63 Pulse (bpm): 120 Source: Stated Respiratory Rate (breaths/min): 20 Weight (lbs): 152 Blood Pressure (mmHg): 118/93 Source: Stated Reference Range: 80 - 120 mg / dl Body Mass Index (BMI): 26.9 Electronic Signature(s) Signed: 01/22/2020 9:09:16 AM By: Sandre Kitty Entered By: Sandre Kitty on 01/06/2020 09:33:11

## 2020-01-22 NOTE — Progress Notes (Signed)
[name] [name], [name] [name] ([id number]) Visit Report for 01/06/2020 Chief Complaint Document Details Patient Name: Date of Service: [name] [name], [name] [name] 01/06/2020 9:00 A M Medical Record Number: [id number] Patient Account Number: [id number] Date of Birth/Sex: Treating RN: Jan 25, 1996 (24 y.o. [name] [name] Primary Care Provider: Merita Norton ULT, PRO Ave Filter Other Clinician: Referring Provider: Treating Provider/Extender: [name] [name] in Treatment: 0 Information Obtained from: Patient Chief Complaint 01/06/2020; patient comes to clinic today with wounds on the right lateral calf x2 Electronic Signature(s) Signed: 01/06/2020 5:40:04 PM By: [name] Ham MD Entered By: [name] [name] on 01/06/2020 12:08:13 -------------------------------------------------------------------------------- HPI Details Patient Name: Date of Service: [name] Jeans MIE L. 01/06/2020 9:00 A M Medical Record Number: [id number] Patient Account Number: [id number] Date of Birth/Sex: Treating RN: 10-Jan-1996 (24 y.o. [name] [name] Primary Care Provider: Merita Norton ULT, PRO V [name] Other Clinician: Referring Provider: Treating Provider/Extender: [name] [name] in Treatment: 0 History of Present Illness HPI Description: ADMISSION 01/06/2020 This is a complex 24 year old woman who arrives in clinic today for review of wounds on the right lateral leg. She was referred by her rheumatologist Dr. Graylin [name] at [facility]. The patient has a history of systemic lupus at age 24 with positive ANA positive double-stranded DNA positive SSA. She also has a history of Raynaud's phenomenon, lupus nephritis stage III previously biopsied. She also has a history of an extensive DVT and apparently pulmonary embolism in September 2020 and she has remained on Xarelto since then. Finally she has a history anticardiolipin antibody positive and positive lupus anticoagulant She tells me that she noted 2 skin lesions on the right lateral calf the larger one proximally  and the smaller one medially sometime in January. She apparently was seen in the ER at the time felt to possibly have a current DVT in the leg. Apparently that was worked up and was negative. She was admitted to hospital from 3/26 through 3/29 and was felt to have a flare of her lupus. She was given Augmentin apparently for possible cellulitis in the right lower extremity. She was back in the ER on 3/29 the day she was discharged with pain in the leg. The patient states that the areas currently seen opened in February. She would been putting alcohol on it I think Dr. Graylin [name] recently gave her mupirocin. Past medical history; the patient is a non-smoker. She has never been pregnant. As noted she has a significant history of venous thromboembolic disease although her recent DVT rule out was negative. She has hypertension, lupus nephritis Raynaud's syndrome and depression. She has Medicaid pending which we will have to keep in mind when we prescribe either drugs or wound care products. There currently paying for a lot of this out-of-pocket with help from family members ABI in our clinic was 0.89 on the right Electronic Signature(s) Signed: 01/06/2020 5:40:04 PM By: [name] Ham MD Entered By: [name] [name] on 01/06/2020 12:15:02 -------------------------------------------------------------------------------- Physical Exam Details Patient Name: Date of Service: [name] Jeans MIE L. 01/06/2020 9:00 A M Medical Record Number: [id number] Patient Account Number: [id number] Date of Birth/Sex: Treating RN: [birth year] (24 y.o. [name] [name] Primary Care Provider: Merita Norton ULT, PRO V [name] Other Clinician: Referring Provider: Treating Provider/Extender: [name] [name] in Treatment: 0 Constitutional Patient is hypertensive.. Somewhat tachycardic but regular. Respirations regular, non-labored and within target range.. Temperature is normal and within the target range for the patient.Marland Kitchen Appears in no  distress. Eyes Conjunctivae clear. No discharge.no icterus. Respiratory work of breathing is normal. Bilateral breath sounds are clear  and equal in all lobes with no wheezes, rales or rhonchi.. Cardiovascular Heart rhythm and rate regular, without murmur or gallop. No friction rubs. Femoral pulses and popliteal pulses are palpable but certainly not robust. Am unable to feel pulses in either one of her feet. Quite a temperature change in the right lower extremity from just below the knee where it is warm to her feet which are cool. T almost look dusky on 1-3. oes Musculoskeletal No active joints in the knees or hands or feet. Integumentary (Hair, Skin) I see no evidence of livedoid reticularis in the bilateral lower extremities. Psychiatric appears at normal baseline. Notes Wound exam; the patient has 2 wounds on the right lateral calf. The more proximal is a punched-out somewhat inflamed looking wound with not a very viable surface. This clearly goes down into the fat layer of the leg. Very painful to palpation even around the wound. She has a much smaller area more distal towards the ankle this is superficial and limited to breakdown of the skin Electronic Signature(s) Signed: 01/06/2020 5:40:04 PM By: [name] Ham MD Entered By: [name] [name] on 01/06/2020 12:19:10 -------------------------------------------------------------------------------- Physician Orders Details Patient Name: Date of Service: [name] Jeans MIE L. 01/06/2020 9:00 A M Medical Record Number: [id number] Patient Account Number: [id number] Date of Birth/Sex: Treating RN: May 25, 1996 (24 y.o. [name] [name] Primary Care Provider: Merita Norton ULT, PRO V [name] Other Clinician: Referring Provider: Treating Provider/Extender: [name] [name] in Treatment: 0 Verbal / Phone Orders: No Diagnosis Coding Follow-up Appointments Return Appointment in 1 week. Dressing Change Frequency Wound #1 Right,Lateral Lower Leg Change  Dressing every other day. Wound #2 Right,Distal,Lateral Lower Leg Change Dressing every other day. Wound Cleansing Wound #1 Right,Lateral Lower Leg May shower and wash wound with soap and water. - on days that dressing is changed Wound #2 Right,Distal,Lateral Lower Leg May shower and wash wound with soap and water. - on days that dressing is changed Primary Wound Dressing Wound #1 Right,Lateral Lower Leg Silver Collagen - moisten with hydrogel or KY jelly Wound #2 Right,Distal,Lateral Lower Leg Silver Collagen - moisten with hydrogel or KY jelly Secondary Dressing Wound #1 Right,Lateral Lower Leg Foam Border - or bandaid Wound #2 Right,Distal,Lateral Lower Leg Foam Border - or bandaid Services and Therapies rterial Studies- Bilateral with ABIs and TBIs, arterial dopplers - ASAP - Necrotic ulcer on right lower leg, non palpable pedal pulse A Electronic Signature(s) Signed: 01/06/2020 5:33:33 PM By: Levan Hurst RN, BSN Signed: 01/06/2020 5:40:04 PM By: [name] Ham MD Entered By: Levan Hurst on 01/06/2020 10:37:12 Prescription 01/06/2020 -------------------------------------------------------------------------------- Tondreau, Zanaya L. [name] Ham MD Patient Name: Provider: Apr 08, 1996 [id number] Date of Birth: NPI#: [id number] Sex: DEA #: [id number] Phone #: License #: [place] Patient Address: [place] [address] [place], [place] [address] [place], [place] [address] [phone] Allergies Name Reaction Severity latex hydroxychloroquine Provider's Orders rterial Studies- Bilateral with ABIs and TBIs, arterial dopplers - ASAP - Necrotic ulcer on right lower leg, non palpable pedal pulse A Hand Signature: Date(s): Electronic Signature(s) Signed: 01/06/2020 5:33:33 PM By: Levan Hurst RN, BSN Signed: 01/06/2020 5:40:04 PM By: [name] Ham MD Entered By: Levan Hurst on 01/06/2020  10:37:15 -------------------------------------------------------------------------------- Problem List Details Patient Name: Date of Service: [name] Jeans MIE L. 01/06/2020 9:00 A M Medical Record Number: [id number] Patient Account Number: [id number] Date of Birth/Sex: Treating RN: 03-24-1996 (24 y.o. [name] [name] Primary Care Provider: DEFA ULT, PRO V [name] Other  Clinician: Referring Provider: Treating Provider/Extender: [name] [name] in Treatment: 0 Active Problems ICD-10 Encounter Code Description Active Date MDM Diagnosis L97.212 Non-pressure chronic ulcer of right calf with fat layer exposed 01/06/2020 No Yes L97.211 Non-pressure chronic ulcer of right calf limited to breakdown of skin 01/06/2020 No Yes M32.19 Other organ or system involvement in systemic lupus erythematosus 01/06/2020 No Yes I73.00 Raynaud's syndrome without gangrene 01/06/2020 No Yes Inactive Problems Resolved Problems Electronic Signature(s) Signed: 01/06/2020 5:40:04 PM By: [name] Ham MD Entered By: [name] [name] on 01/06/2020 12:07:38 -------------------------------------------------------------------------------- Progress Note Details Patient Name: Date of Service: [name] Jeans MIE L. 01/06/2020 9:00 A M Medical Record Number: [id number] Patient Account Number: [id number] Date of Birth/Sex: Treating RN: November 19, 1995 (24 y.o. [name] [name] Primary Care Provider: Merita Norton ULT, PRO [address] Filter Other Clinician: Referring Provider: Treating Provider/Extender: [name] [name] in Treatment: 0 Subjective Chief Complaint Information obtained from Patient 01/06/2020; patient comes to clinic today with wounds on the right lateral calf x2 History of Present Illness (HPI) ADMISSION 01/06/2020 This is a complex 25 year old woman who arrives in clinic today for review of wounds on the right lateral leg. She was referred by her rheumatologist Dr. Graylin [name] at [facility]. The patient has a history of systemic lupus at  age 65 with positive ANA positive double-stranded DNA positive SSA. She also has a history of Raynaud's phenomenon, lupus nephritis stage III previously biopsied. She also has a history of an extensive DVT and apparently pulmonary embolism in September 2020 and she has remained on Xarelto since then. Finally she has a history anticardiolipin antibody positive and positive lupus anticoagulant She tells me that she noted 2 skin lesions on the right lateral calf the larger one proximally and the smaller one medially sometime in January. She apparently was seen in the ER at the time felt to possibly have a current DVT in the leg. Apparently that was worked up and was negative. She was admitted to hospital from 3/26 through 3/29 and was felt to have a flare of her lupus. She was given Augmentin apparently for possible cellulitis in the right lower extremity. She was back in the ER on 3/29 the day she was discharged with pain in the leg. The patient states that the areas currently seen opened in February. She would been putting alcohol on it I think Dr. Graylin [name] recently gave her mupirocin. Past medical history; the patient is a non-smoker. She has never been pregnant. As noted she has a significant history of venous thromboembolic disease although her recent DVT rule out was negative. She has hypertension, lupus nephritis Raynaud's syndrome and depression. She has Medicaid pending which we will have to keep in mind when we prescribe either drugs or wound care products. There currently paying for a lot of this out-of-pocket with help from family members ABI in our clinic was 0.89 on the right Patient History Information obtained from Patient. Allergies latex, hydroxychloroquine Family History Diabetes - Siblings, Heart Disease - Mother, Hypertension - Mother, Seizures - Siblings, Stroke - Siblings, No family history of Cancer, Hereditary Spherocytosis, Kidney Disease, Lung Disease, Thyroid Problems,  Tuberculosis. Social History Never smoker, Marital Status - Single, Alcohol Use - Rarely, Drug Use - No History, Caffeine Use - Never. Medical History Eyes Denies history of Cataracts, Glaucoma, Optic Neuritis Ear/Nose/Mouth/Throat Denies history of Chronic sinus problems/congestion, Middle ear problems Hematologic/Lymphatic Denies history of Anemia, Hemophilia, Human Immunodeficiency Virus, Lymphedema, Sickle Cell Disease Respiratory Denies history of Aspiration, Asthma, Chronic Obstructive Pulmonary Disease (COPD), Pneumothorax, Sleep  Apnea, Tuberculosis Cardiovascular Patient has history of Hypertension Denies history of Angina, Arrhythmia, Congestive Heart Failure, Coronary Artery Disease, Deep Vein Thrombosis, Hypotension, Myocardial Infarction, Peripheral Arterial Disease, Peripheral Venous Disease, Phlebitis, Vasculitis Gastrointestinal Denies history of Cirrhosis , Colitis, Crohnoos, Hepatitis A, Hepatitis B Endocrine Denies history of Type I Diabetes, Type II Diabetes Genitourinary Denies history of End Stage Renal Disease Immunological Patient has history of Lupus Erythematosus, Raynaudoos Denies history of Scleroderma Integumentary (Skin) Denies history of History of Burn Musculoskeletal Denies history of Gout, Rheumatoid Arthritis, Osteoarthritis, Osteomyelitis Neurologic Denies history of Dementia, Neuropathy, Quadriplegia, Paraplegia, Seizure Disorder Oncologic Denies history of Received Chemotherapy, Received Radiation Psychiatric Denies history of Anorexia/bulimia, Confinement Anxiety Review of Systems (ROS) Constitutional Symptoms (General Health) Denies complaints or symptoms of Fatigue, Fever, Chills, Marked Weight Change. Eyes Denies complaints or symptoms of Dry Eyes, Vision Changes, Glasses / Contacts. Ear/Nose/Mouth/Throat Denies complaints or symptoms of Chronic sinus problems or rhinitis. Respiratory Denies complaints or symptoms of Chronic or  frequent coughs, Shortness of Breath. Cardiovascular Denies complaints or symptoms of Chest pain. Gastrointestinal Denies complaints or symptoms of Frequent diarrhea, Nausea, Vomiting. Endocrine Denies complaints or symptoms of Heat/cold intolerance. Genitourinary Denies complaints or symptoms of Frequent urination. Integumentary (Skin) Complains or has symptoms of Wounds. Musculoskeletal Denies complaints or symptoms of Muscle Pain, Muscle Weakness. Neurologic Denies complaints or symptoms of Numbness/parasthesias. Psychiatric Denies complaints or symptoms of Claustrophobia, Suicidal. Objective Constitutional Patient is hypertensive.. Somewhat tachycardic but regular. Respirations regular, non-labored and within target range.. Temperature is normal and within the target range for the patient.Marland Kitchen Appears in no distress. Vitals Time Taken: 9:24 AM, Height: 63 in, Source: Stated, Weight: 152 lbs, Source: Stated, BMI: 26.9, Temperature: 98.4 F, Pulse: 120 bpm, Respiratory Rate: 20 breaths/min, Blood Pressure: 118/93 mmHg. Eyes Conjunctivae clear. No discharge.no icterus. Respiratory work of breathing is normal. Bilateral breath sounds are clear and equal in all lobes with no wheezes, rales or rhonchi.. Cardiovascular Heart rhythm and rate regular, without murmur or gallop. No friction rubs. Femoral pulses and popliteal pulses are palpable but certainly not robust. Am unable to feel pulses in either one of her feet. Quite a temperature change in the right lower extremity from just below the knee where it is warm to her feet which are cool. T almost look dusky on 1-3. oes Musculoskeletal No active joints in the knees or hands or feet. Psychiatric appears at normal baseline. General Notes: Wound exam; the patient has 2 wounds on the right lateral calf. The more proximal is a punched-out somewhat inflamed looking wound with not a very viable surface. This clearly goes down into the fat  layer of the leg. Very painful to palpation even around the wound. She has a much smaller area more distal towards the ankle this is superficial and limited to breakdown of the skin Integumentary (Hair, Skin) I see no evidence of livedoid reticularis in the bilateral lower extremities. Wound #1 status is Open. Original cause of wound was Gradually Appeared. The wound is located on the Right,Lateral Lower Leg. The wound measures 1.3cm length x 1.5cm width x 0.6cm depth; 1.532cm^2 area and 0.919cm^3 volume. There is Fat Layer (Subcutaneous Tissue) Exposed exposed. There is no tunneling or undermining noted. There is a medium amount of serosanguineous drainage noted. The wound margin is well defined and not attached to the wound base. There is medium (34-66%) red granulation within the wound bed. There is a medium (34-66%) amount of necrotic tissue within the wound bed including Adherent Slough. Wound #  2 status is Open. Original cause of wound was Gradually Appeared. The wound is located on the Right,Distal,Lateral Lower Leg. The wound measures 0.3cm length x 0.2cm width x 0.2cm depth; 0.047cm^2 area and 0.009cm^3 volume. There is Fat Layer (Subcutaneous Tissue) Exposed exposed. There is no tunneling or undermining noted. There is a small amount of serosanguineous drainage noted. The wound margin is well defined and not attached to the wound base. There is medium (34-66%) pink granulation within the wound bed. There is a medium (34-66%) amount of necrotic tissue within the wound bed including Adherent Slough. Assessment Active Problems ICD-10 Non-pressure chronic ulcer of right calf with fat layer exposed Non-pressure chronic ulcer of right calf limited to breakdown of skin Other organ or system involvement in systemic lupus erythematosus Raynaud's syndrome without gangrene Plan Follow-up Appointments: Return Appointment in 1 week. Dressing Change Frequency: Wound #1 Right,Lateral Lower  Leg: Change Dressing every other day. Wound #2 Right,Distal,Lateral Lower Leg: Change Dressing every other day. Wound Cleansing: Wound #1 Right,Lateral Lower Leg: May shower and wash wound with soap and water. - on days that dressing is changed Wound #2 Right,Distal,Lateral Lower Leg: May shower and wash wound with soap and water. - on days that dressing is changed Primary Wound Dressing: Wound #1 Right,Lateral Lower Leg: Silver Collagen - moisten with hydrogel or KY jelly Wound #2 Right,Distal,Lateral Lower Leg: Silver Collagen - moisten with hydrogel or KY jelly Secondary Dressing: Wound #1 Right,Lateral Lower Leg: Foam Border - or bandaid Wound #2 Right,Distal,Lateral Lower Leg: Foam Border - or bandaid Services and Therapies ordered were: Arterial Studies- Bilateral with ABIs and TBIs, arterial dopplers - ASAP - Necrotic ulcer on right lower leg, non palpable pedal pulse 1. Very concerning case in a 24 year old woman with severe lupus. 2. The wounds themselves could be related to vasculitis and at some point are probably going to need to be biopsied 3. Because of the coolness of the right leg and foot I did not go ahead with biopsying this today. She will need punch biopsies x2 but I would like to know her formal arterial studies before I go ahead with this. These would include ABIs and segmental pressure, arterial Dopplers and TBI's. We have ordered these today 4. The arterial issue was based on the coolness of her foot in the absence of peripheral pulses in the right foot or left foot for that matter. It is possible that this is due to severe Raynaud's but I am wanting to make sure of what we are dealing with before any further aggressive treatment on the wounds 5. I also had the thought about antiphospholipid syndrome in a patient who has positive antibodies, history of DVT severe lupus. I wonder if this could be predisposing her to arterial insufficiency in the tibial  arteries. 6. Await formal arterial studies before going forward with potential biopsies. I may need to speak to Dr. Graylin [name] about involving a dermatologist and a dermatopathologist I spent 55 minutes in review of this patient's records, face-to-face evaluation and preparation of this record Electronic Signature(s) Signed: 01/06/2020 5:40:04 PM By: [name] Ham MD Entered By: [name] [name] on 01/06/2020 12:23:15 -------------------------------------------------------------------------------- HxROS Details Patient Name: Date of Service: [name] Jeans MIE L. 01/06/2020 9:00 A M Medical Record Number: [id number] Patient Account Number: [id number] Date of Birth/Sex: Treating RN: 1995-10-24 (24 y.o. [name] [name] Primary Care Provider: Merita Norton ULT, PRO [address] Filter Other Clinician: Referring Provider: Treating Provider/Extender: [name] [name] in Treatment: 0 Information Obtained From Patient Constitutional Symptoms (  General Health) Complaints and Symptoms: Negative for: Fatigue; Fever; Chills; Marked Weight Change Eyes Complaints and Symptoms: Negative for: Dry Eyes; Vision Changes; Glasses / Contacts Medical History: Negative for: Cataracts; Glaucoma; Optic Neuritis Ear/Nose/Mouth/Throat Complaints and Symptoms: Negative for: Chronic sinus problems or rhinitis Medical History: Negative for: Chronic sinus problems/congestion; Middle ear problems Respiratory Complaints and Symptoms: Negative for: Chronic or frequent coughs; Shortness of Breath Medical History: Negative for: Aspiration; Asthma; Chronic Obstructive Pulmonary Disease (COPD); Pneumothorax; Sleep Apnea; Tuberculosis Cardiovascular Complaints and Symptoms: Negative for: Chest pain Medical History: Positive for: Hypertension Negative for: Angina; Arrhythmia; Congestive Heart Failure; Coronary Artery Disease; Deep Vein Thrombosis; Hypotension; Myocardial Infarction; Peripheral Arterial Disease; Peripheral Venous Disease;  Phlebitis; Vasculitis Gastrointestinal Complaints and Symptoms: Negative for: Frequent diarrhea; Nausea; Vomiting Medical History: Negative for: Cirrhosis ; Colitis; Crohns; Hepatitis A; Hepatitis B Endocrine Complaints and Symptoms: Negative for: Heat/cold intolerance Medical History: Negative for: Type I Diabetes; Type II Diabetes Genitourinary Complaints and Symptoms: Negative for: Frequent urination Medical History: Negative for: End Stage Renal Disease Integumentary (Skin) Complaints and Symptoms: Positive for: Wounds Medical History: Negative for: History of Burn Musculoskeletal Complaints and Symptoms: Negative for: Muscle Pain; Muscle Weakness Medical History: Negative for: Gout; Rheumatoid Arthritis; Osteoarthritis; Osteomyelitis Neurologic Complaints and Symptoms: Negative for: Numbness/parasthesias Medical History: Negative for: Dementia; Neuropathy; Quadriplegia; Paraplegia; Seizure Disorder Psychiatric Complaints and Symptoms: Negative for: Claustrophobia; Suicidal Medical History: Negative for: Anorexia/bulimia; Confinement Anxiety Hematologic/Lymphatic Medical History: Negative for: Anemia; Hemophilia; Human Immunodeficiency Virus; Lymphedema; Sickle Cell Disease Immunological Medical History: Positive for: Lupus Erythematosus; Raynauds Negative for: Scleroderma Oncologic Medical History: Negative for: Received Chemotherapy; Received Radiation Immunizations Pneumococcal Vaccine: Received Pneumococcal Vaccination: No Implantable Devices None Family and Social History Cancer: No; Diabetes: Yes - Siblings; Heart Disease: Yes - Mother; Hereditary Spherocytosis: No; Hypertension: Yes - Mother; Kidney Disease: No; Lung Disease: No; Seizures: Yes - Siblings; Stroke: Yes - Siblings; Thyroid Problems: No; Tuberculosis: No; Never smoker; Marital Status - Single; Alcohol Use: Rarely; Drug Use: No History; Caffeine Use: Never; Financial Concerns: No; Food,  Clothing or Shelter Needs: No; Support System Lacking: No; Transportation Concerns: No Electronic Signature(s) Signed: 01/06/2020 5:33:33 PM By: Levan Hurst RN, BSN Signed: 01/06/2020 5:40:04 PM By: [name] Ham MD Signed: 01/22/2020 9:09:16 AM By: Sandre Kitty Entered By: Sandre Kitty on 01/06/2020 09:39:11 -------------------------------------------------------------------------------- Strang Details Patient Name: Date of Service: [name] Jeans MIE Carlean Jews 01/06/2020 Medical Record Number: [id number] Patient Account Number: [id number] Date of Birth/Sex: Treating RN: [birth year] (24 y.o. [name] [name] Primary Care Provider: Merita Norton ULT, PRO V [name] Other Clinician: Referring Provider: Treating Provider/Extender: [name] [name] in Treatment: 0 Diagnosis Coding ICD-10 Codes Code Description [phone] Non-pressure chronic ulcer of right calf with fat layer exposed L97.211 Non-pressure chronic ulcer of right calf limited to breakdown of skin M32.19 Other organ or system involvement in systemic lupus erythematosus I73.00 Raynaud's syndrome without gangrene Facility Procedures CPT4 Code: [id number] Description: 99214 - WOUND CARE VISIT-LEV 4 EST PT Modifier: Quantity: 1 Physician Procedures : CPT4 Code Description Modifier [id number] - WC PHYS LEVEL 4 - NEW PT ICD-10 Diagnosis Description 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

## 2020-01-22 NOTE — Progress Notes (Signed)
[name] [name], [name] ([id number]) Visit Report for 01/06/2020 Abuse/Suicide Risk Screen Details Patient Name: Date of Service: [name] [name], [name] [name] 01/06/2020 9:00 A M Medical Record Number: [id number] Patient Account Number: [id number] Date of Birth/Sex: Treating RN: Jan 12, 1996 (24 y.o. [name] Primary Care Blanchard Willhite: Merita Norton ULT, PRO [address] Filter Other Clinician: Referring Cobin Cadavid: Treating Celenia Hruska/Extender: Cheree Ditto in Treatment: 0 Abuse/Suicide Risk Screen Items Answer ABUSE RISK SCREEN: Has anyone close to you tried to hurt or harm you recentlyo No Do you feel uncomfortable with anyone in your familyo No Has anyone forced you do things that you didnt want to doo No Electronic Signature(s) Signed: 01/06/2020 5:33:33 PM By: Levan Hurst RN, BSN Signed: 01/22/2020 9:09:16 AM By: Sandre Kitty Entered By: Sandre Kitty on 01/06/2020 09:39:22 -------------------------------------------------------------------------------- Activities of Daily Living Details Patient Name: Date of Service: [name] Jeans MIE L. 01/06/2020 9:00 A M Medical Record Number: [id number] Patient Account Number: [id number] Date of Birth/Sex: Treating RN: May 03, 1996 (24 y.o. [name] Primary Care Jabari Swoveland: Merita Norton ULT, PRO [address] Other Clinician: Referring Escher Harr: Treating Damario Gillie/Extender: Cheree Ditto in Treatment: 0 Activities of Daily Living Items Answer Activities of Daily Living (Please select one for each item) Drive Automobile Not Able T Medications ake Completely Able Use T elephone Completely Able Care for Appearance Completely Able Use T oilet Completely Able Bath / Shower Completely Able Dress Self Need Assistance Feed Self Completely Able Walk Need Assistance Get In / Out Bed Need Assistance Housework Need Assistance Prepare Meals Need Assistance Handle Money Completely Able Shop for Self Need Assistance Electronic Signature(s) Signed: 01/06/2020 5:33:33 PM By:  Levan Hurst RN, BSN Signed: 01/22/2020 9:09:16 AM By: Sandre Kitty Entered By: Sandre Kitty on 01/06/2020 09:40:51 -------------------------------------------------------------------------------- Education Screening Details Patient Name: Date of Service: [name] Jeans MIE L. 01/06/2020 9:00 A M Medical Record Number: [id number] Patient Account Number: [id number] Date of Birth/Sex: Treating RN: 26-Nov-1995 (24 y.o. [name] Primary Care [name] [name]: Merita Norton ULT, PRO V IDER Other Clinician: Referring [name] [name]: Treating [name] [name]/Extender: Cheree Ditto in Treatment: 0 Primary Learner Assessed: Patient Learning Preferences/Education Level/Primary Language Learning Preference: Explanation Highest Education Level: College or Above Preferred Language: English Cognitive Barrier Language Barrier: No Translator Needed: No Memory Deficit: No Emotional Barrier: No Cultural/Religious Beliefs Affecting Medical Care: No Physical Barrier Impaired Vision: No Impaired Hearing: No Decreased Hand dexterity: No Knowledge/Comprehension Knowledge Level: Medium Comprehension Level: High Ability to understand written instructions: High Ability to understand verbal instructions: High Motivation Anxiety Level: Calm Cooperation: Cooperative Education Importance: Acknowledges Need Interest in Health Problems: Asks Questions Perception: Coherent Willingness to Engage in Self-Management High Activities: Readiness to Engage in Self-Management High Activities: Electronic Signature(s) Signed: 01/06/2020 5:33:33 PM By: Levan Hurst RN, BSN Signed: 01/22/2020 9:09:16 AM By: Sandre Kitty Entered By: Sandre Kitty on 01/06/2020 09:41:25 -------------------------------------------------------------------------------- Fall Risk Assessment Details Patient Name: Date of Service: [name] Jeans MIE L. 01/06/2020 9:00 A M Medical Record Number: [id number] Patient Account Number:  [id number] Date of Birth/Sex: Treating RN: [birth year] (24 y.o. [name] Primary Care Willma Obando: Merita Norton ULT, PRO V IDER Other Clinician: Referring [name] [name]: Treating [name] [name]/Extender: Cheree Ditto in Treatment: 0 Fall Risk Assessment Items Have you had 2 or more falls in the last 12 monthso 0 No Have you had any fall that resulted in injury in the last 12 monthso 0 No FALLS RISK SCREEN History of falling - immediate or within 3 months 0 No Secondary diagnosis (Do you have 2 or more medical diagnoseso) 0 No Ambulatory aid  None/bed rest/wheelchair/nurse 0 No Crutches/cane/walker 0 No Furniture 0 No Intravenous therapy Access/Saline/Heparin Lock 0 No Gait/Transferring Normal/ bed rest/ wheelchair 0 No Weak (short steps with or without shuffle, stooped but able to lift head while walking, may seek 0 No support from furniture) Impaired (short steps with shuffle, may have difficulty arising from chair, head down, impaired 0 No balance) Mental Status Oriented to own ability 0 No Electronic Signature(s) Signed: 01/06/2020 5:33:33 PM By: Levan Hurst RN, BSN Signed: 01/22/2020 9:09:16 AM By: Sandre Kitty Entered By: Sandre Kitty on 01/06/2020 09:41:36 -------------------------------------------------------------------------------- Foot Assessment Details Patient Name: Date of Service: [name] Jeans MIE L. 01/06/2020 9:00 A M Medical Record Number: [id number] Patient Account Number: [id number] Date of Birth/Sex: Treating RN: 1995/11/28 (24 y.o. [name] Primary Care [name] [name]: Merita Norton ULT, PRO V IDER Other Clinician: Referring Larae Caison: Treating Jasir Rother/Extender: Cheree Ditto in Treatment: 0 Foot Assessment Items Site Locations + = Sensation present, - = Sensation absent, C = Callus, U = Ulcer R = Redness, W = Warmth, M = Maceration, PU = Pre-ulcerative lesion F = Fissure, S = Swelling, D = Dryness Assessment Right: Left: Other Deformity: No  No Prior Foot Ulcer: No No Prior Amputation: No No Charcot Joint: No No Ambulatory Status: Ambulatory With Help Assistance Device: Walker Gait: Steady Electronic Signature(s) Signed: 01/06/2020 5:33:33 PM By: Levan Hurst RN, BSN Signed: 01/22/2020 9:09:16 AM By: Sandre Kitty Entered By: Sandre Kitty on 01/06/2020 09:44:11 -------------------------------------------------------------------------------- Nutrition Risk Screening Details Patient Name: Date of Service: [name] Jeans MIE L. 01/06/2020 9:00 A M Medical Record Number: [id number] Patient Account Number: [id number] Date of Birth/Sex: Treating RN: 1996/04/24 (24 y.o. [name] Primary Care Euriah Matlack: Merita Norton ULT, PRO V IDER Other Clinician: Referring Valine Drozdowski: Treating Angeliz Settlemyre/Extender: Cheree Ditto in Treatment: 0 Height (in): 63 Weight (lbs): 152 Body Mass Index (BMI): 26.9 Nutrition Risk Screening Items Score Screening NUTRITION RISK SCREEN: I have an illness or condition that made me change the kind and/or amount of food I eat 0 No I eat fewer than two meals per day 0 No I eat few fruits and vegetables, or milk products 0 No I have three or more drinks of beer, liquor or wine almost every day 0 No I have tooth or mouth problems that make it hard for me to eat 0 No I don't always have enough money to buy the food I need 0 No I eat alone most of the time 0 No I take three or more different prescribed or over-the-counter drugs a day 1 Yes Without wanting to, I have lost or gained 10 pounds in the last six months 2 Yes I am not always physically able to shop, cook and/or feed myself 2 Yes Nutrition Protocols Good Risk Protocol Moderate Risk Protocol 0 Provide education on nutrition High Risk Proctocol Risk Level: Moderate Risk Score: 5 Electronic Signature(s) Signed: 01/06/2020 5:33:33 PM By: Levan Hurst RN, BSN Signed: 01/22/2020 9:09:16 AM By: Sandre Kitty Entered By: Sandre Kitty on  01/06/2020 09:41:59

## 2020-01-27 ENCOUNTER — Encounter (HOSPITAL_BASED_OUTPATIENT_CLINIC_OR_DEPARTMENT_OTHER): Payer: Medicaid Other | Admitting: Internal Medicine

## 2020-01-27 NOTE — Progress Notes (Signed)
[place], [place] ([id number]) Visit Report for 01/27/2020 Arrival Information Details Patient Name: Date of Service: [name] [name], [name] [name] 01/27/2020 9:30 A M Medical Record Number: [id number] Patient Account Number: [id number] Date of Birth/Sex: Treating RN: [birth year] (24 y.o. Hollie Salk, Larene [place] Primary Care Sequoia Mincey: Merita Norton ULT, PRO V IDER Other Clinician: Referring Zakariah Urwin: Treating Kasmira Cacioppo/Extender: Cheree Ditto in Treatment: 3 Visit Information History Since Last Visit Added or deleted any medications: No Patient Arrived: Ambulatory Any new allergies or adverse reactions: No Arrival Time: 09:53 Had a fall or experienced change in No Accompanied By: family member activities of daily living that may affect Transfer Assistance: None risk of falls: Patient Identification Verified: Yes Signs or symptoms of abuse/neglect since last visito No Secondary Verification Process Completed: Yes Hospitalized since last visit: No Patient Requires Transmission-Based Precautions: No Implantable device outside of the clinic excluding No Patient Has Alerts: No cellular tissue based products placed in the center since last visit: Has Dressing in Place as Prescribed: Yes Pain Present Now: Yes Electronic Signature(s) Signed: 01/27/2020 5:05:18 PM By: Kela Millin Entered By: Kela Millin on 01/27/2020 09:54:05 -------------------------------------------------------------------------------- Clinic Level of Care Assessment Details Patient Name: Date of Service: [name] [name], [name] [name] 01/27/2020 9:30 A M Medical Record Number: [id number] Patient Account Number: [id number] Date of Birth/Sex: Treating RN: 01-02-1996 (24 y.o. [name] Primary Care Magdalyn Arenivas: Merita Norton ULT, PRO V IDER Other Clinician: Referring Anastasio Wogan: Treating Alija Riano/Extender: Cheree Ditto in Treatment: 3 Clinic Level of Care Assessment Items TOOL 4 Quantity Score X- 1 0 Use when only an EandM is performed on  FOLLOW-UP visit ASSESSMENTS - Nursing Assessment / Reassessment X- 1 10 Reassessment of Co-morbidities (includes updates in patient status) X- 1 5 Reassessment of Adherence to Treatment Plan ASSESSMENTS - Wound and Skin A ssessment / Reassessment X - Simple Wound Assessment / Reassessment - one wound 1 5 []  - 0 Complex Wound Assessment / Reassessment - multiple wounds []  - 0 Dermatologic / Skin Assessment (not related to wound area) ASSESSMENTS - Focused Assessment []  - 0 Circumferential Edema Measurements - multi extremities []  - 0 Nutritional Assessment / Counseling / Intervention X- 1 5 Lower Extremity Assessment (monofilament, tuning fork, pulses) []  - 0 Peripheral Arterial Disease Assessment (using hand held doppler) ASSESSMENTS - Ostomy and/or Continence Assessment and Care []  - 0 Incontinence Assessment and Management []  - 0 Ostomy Care Assessment and Management (repouching, etc.) PROCESS - Coordination of Care X - Simple Patient / Family Education for ongoing care 1 15 []  - 0 Complex (extensive) Patient / Family Education for ongoing care X- 1 10 Staff obtains Programmer, systems, Records, T Results / Process Orders est []  - 0 Staff telephones HHA, Nursing Homes / Clarify orders / etc []  - 0 Routine Transfer to another Facility (non-emergent condition) []  - 0 Routine Hospital Admission (non-emergent condition) []  - 0 New Admissions / Biomedical engineer / Ordering NPWT Apligraf, etc. , []  - 0 Emergency Hospital Admission (emergent condition) X- 1 10 Simple Discharge Coordination []  - 0 Complex (extensive) Discharge Coordination PROCESS - Special Needs []  - 0 Pediatric / Minor Patient Management []  - 0 Isolation Patient Management []  - 0 Hearing / Language / Visual special needs []  - 0 Assessment of Community assistance (transportation, D/C planning, etc.) []  - 0 Additional assistance / Altered mentation []  - 0 Support Surface(s) Assessment (bed, cushion,  seat, etc.) INTERVENTIONS - Wound Cleansing / Measurement X - Simple Wound Cleansing - one wound 1 5 []  - 0 Complex Wound Cleansing -  multiple wounds X- 1 5 Wound Imaging (photographs - any number of wounds) []  - 0 Wound Tracing (instead of photographs) X- 1 5 Simple Wound Measurement - one wound []  - 0 Complex Wound Measurement - multiple wounds INTERVENTIONS - Wound Dressings X - Small Wound Dressing one or multiple wounds 1 10 []  - 0 Medium Wound Dressing one or multiple wounds []  - 0 Large Wound Dressing one or multiple wounds X- 1 5 Application of Medications - topical []  - 0 Application of Medications - injection INTERVENTIONS - Miscellaneous []  - 0 External ear exam []  - 0 Specimen Collection (cultures, biopsies, blood, body fluids, etc.) []  - 0 Specimen(s) / Culture(s) sent or taken to Lab for analysis []  - 0 Patient Transfer (multiple staff / Civil Service fast streamer / Similar devices) []  - 0 Simple Staple / Suture removal (25 or less) []  - 0 Complex Staple / Suture removal (26 or more) []  - 0 Hypo / Hyperglycemic Management (close monitor of Blood Glucose) []  - 0 Ankle / Brachial Index (ABI) - do not check if billed separately X- 1 5 Vital Signs Has the patient been seen at the hospital within the last three years: Yes Total Score: 95 Level Of Care: New/Established - Level 3 Electronic Signature(s) Signed: 01/27/2020 5:54:52 PM By: Levan Hurst RN, BSN Entered By: Levan Hurst on 01/27/2020 10:40:30 -------------------------------------------------------------------------------- Encounter Discharge Information Details Patient Name: Date of Service: [name] Jeans MIE L. 01/27/2020 9:30 A M Medical Record Number: [id number] Patient Account Number: [id number] Date of Birth/Sex: Treating RN: [birth year] (24 y.o. [name] Primary Care Candia Kingsbury: Merita Norton ULT, PRO V IDER Other Clinician: Referring Faron Whitelock: Treating Arelia Volpe/Extender: Cheree Ditto in Treatment:  3 Encounter Discharge Information Items Discharge Condition: Stable Ambulatory Status: Ambulatory Discharge Destination: Home Transportation: Private Auto Accompanied By: daughter Schedule Follow-up Appointment: Yes Clinical Summary of Care: Electronic Signature(s) Signed: 01/27/2020 5:02:28 PM By: Deon Pilling Entered By: Deon Pilling on 01/27/2020 10:56:02 -------------------------------------------------------------------------------- Lower Extremity Assessment Details Patient Name: Date of Service: [name] [name], [name] [name] 01/27/2020 9:30 A M Medical Record Number: [id number] Patient Account Number: [id number] Date of Birth/Sex: Treating RN: 11/30/1995 (24 y.o. Clearnce Sorrel Primary Care Doneen Ollinger: Merita Norton ULT, PRO V IDER Other Clinician: Referring Laterrian Hevener: Treating Pamla Pangle/Extender: Cheree Ditto in Treatment: 3 Edema Assessment Assessed: [Left: No] [Right: No] Edema: [Left: Ye] [Right: s] Calf Left: Right: Point of Measurement: 39 cm From Medial Instep cm 33.4 cm Ankle Left: Right: Point of Measurement: 9 cm From Medial Instep cm 21.5 cm Vascular Assessment Pulses: Dorsalis Pedis Palpable: [Right:Yes] Electronic Signature(s) Signed: 01/27/2020 5:05:18 PM By: Kela Millin Entered By: Kela Millin on 01/27/2020 09:55:20 -------------------------------------------------------------------------------- Multi Wound Chart Details Patient Name: Date of Service: [name] Jeans MIE L. 01/27/2020 9:30 A M Medical Record Number: [id number] Patient Account Number: [id number] Date of Birth/Sex: Treating RN: [birth year] (24 y.o. [name] Primary Care Janet Decesare: Merita Norton ULT, PRO Ave Filter Other Clinician: Referring Tommy Goostree: Treating Crist Kruszka/Extender: Cheree Ditto in Treatment: 3 Vital Signs Height(in): 63 Pulse(bpm): 124 Weight(lbs): 152 Blood Pressure(mmHg): 132/84 Body Mass Index(BMI): 27 Temperature(F): 98.5 Respiratory Rate(breaths/min):  18 Photos: [1:No Photos Right, Lateral Lower Leg] [2:No Photos Right, Distal, Lateral Lower Leg] [N/A:N/A N/A] Wound Location: [1:Gradually Appeared] [2:Gradually Appeared] [N/A:N/A] Wounding Event: [1:Inflammatory] [2:Inflammatory] [N/A:N/A] Primary Etiology: [1:Hypertension, Lupus Erythematosus,] [2:Hypertension, Lupus Erythematosus,] [N/A:N/A] Comorbid History: [1:Raynauds 10/14/2019] [2:Raynauds 10/14/2019] [N/A:N/A] Date Acquired: [1:3] [2:3] [N/A:N/A] Weeks of Treatment: [1:Open] [2:Open] [N/A:N/A] Wound Status: [1:1.6x1.7x0.3] [2:0x0x0] [N/A:N/A] Measurements L x W x D (cm) [1:2.136] [2:0] [  N/A:N/A] A (cm) : rea [1:0.641] [2:0] [N/A:N/A] Volume (cm) : [1:-39.40%] [2:100.00%] [N/A:N/A] % Reduction in A rea: [1:30.30%] [2:100.00%] [N/A:N/A] % Reduction in Volume: [1:11] Position 1 (o'clock): [1:0.3] Maximum Distance 1 (cm): [1:3] Starting Position 1 (o'clock): [1:5] Ending Position 1 (o'clock): [1:1.1] Maximum Distance 1 (cm): [1:Yes] [2:No] [N/A:N/A] Tunneling: [1:Yes] [2:No] [N/A:N/A] Undermining: [1:Full Thickness Without Exposed] [2:Full Thickness Without Exposed] [N/A:N/A] Classification: [1:Support Structures Medium] [2:Support Structures None Present] [N/A:N/A] Exudate Amount: [1:Serosanguineous] [2:N/A] [N/A:N/A] Exudate Type: [1:red, brown] [2:N/A] [N/A:N/A] Exudate Color: [1:Well defined, not attached] [2:Distinct, outline attached] [N/A:N/A] Wound Margin: [1:Large (67-100%)] [2:None Present (0%)] [N/A:N/A] Granulation Amount: [1:Red, Pink] [2:N/A] [N/A:N/A] Granulation Quality: [1:Small (1-33%)] [2:None Present (0%)] [N/A:N/A] Necrotic Amount: [1:Fat Layer (Subcutaneous Tissue)] [2:Fascia: No] [N/A:N/A] Exposed Structures: [1:Exposed: Yes Fascia: No Tendon: No Muscle: No Joint: No Bone: No None] [2:Fat Layer (Subcutaneous Tissue) Exposed: No Tendon: No Muscle: No Joint: No Bone: No Large (67-100%)] [N/A:N/A] Treatment Notes Wound #1 (Right, Lateral Lower Leg) 1.  Cleanse With Wound Cleanser 2. Periwound Care Skin Prep 3. Primary Dressing Applied Collegen AG Hydrogel or K-Y Jelly 4. Secondary Dressing Foam Border Dressing 5. Secured With Office manager) Signed: 01/27/2020 4:51:55 PM By: Linton Ham MD Signed: 01/27/2020 5:54:52 PM By: Levan Hurst RN, BSN Entered By: Linton Ham on 01/27/2020 11:22:57 -------------------------------------------------------------------------------- Multi-Disciplinary Care Plan Details Patient Name: Date of Service: [name] Jeans MIE L. 01/27/2020 9:30 A M Medical Record Number: [id number] Patient Account Number: [id number] Date of Birth/Sex: Treating RN: [birth year] (24 y.o. [name] Primary Care Ilana Prezioso: Merita Norton ULT, PRO Ave Filter Other Clinician: Referring Kysa Calais: Treating Barb Shear/Extender: Cheree Ditto in Treatment: 3 Active Inactive Wound/Skin Impairment Nursing Diagnoses: Impaired tissue integrity Knowledge deficit related to ulceration/compromised skin integrity Goals: Patient/caregiver will verbalize understanding of skin care regimen Date Initiated: 01/06/2020 Target Resolution Date: 02/07/2020 Goal Status: Active Ulcer/skin breakdown will have a volume reduction of 30% by week 4 Date Initiated: 01/06/2020 Target Resolution Date: 02/07/2020 Goal Status: Active Interventions: Assess patient/caregiver ability to obtain necessary supplies Assess patient/caregiver ability to perform ulcer/skin care regimen upon admission and as needed Assess ulceration(s) every visit Provide education on ulcer and skin care Notes: Electronic Signature(s) Signed: 01/27/2020 5:54:52 PM By: Levan Hurst RN, BSN Entered By: Levan Hurst on 01/27/2020 10:20:44 -------------------------------------------------------------------------------- Pain Assessment Details Patient Name: Date of Service: [name] Jeans MIE L. 01/27/2020 9:30 A M Medical Record Number:  [id number] Patient Account Number: [id number] Date of Birth/Sex: Treating RN: 1995-10-15 (24 y.o. Clearnce Sorrel Primary Care Mairen Wallenstein: Merita Norton ULT, PRO Ave Filter Other Clinician: Referring Waylyn Tenbrink: Treating Nayson Traweek/Extender: Cheree Ditto in Treatment: 3 Active Problems Location of Pain Severity and Description of Pain Patient Has Paino Yes Site Locations Pain Location: Pain in Ulcers With Dressing Change: Yes Duration of the Pain. Constant / Intermittento Constant Rate the pain. Current Pain Level: 7 Worst Pain Level: 10 Least Pain Level: 5 Tolerable Pain Level: 6 Character of Pain Describe the Pain: Aching, Burning, Shooting Pain Management and Medication Current Pain Management: Electronic Signature(s) Signed: 01/27/2020 5:05:18 PM By: Kela Millin Entered By: Kela Millin on 01/27/2020 09:54:50 -------------------------------------------------------------------------------- Patient/Caregiver Education Details Patient Name: Date of Service: [name] [name] 5/17/2021andnbsp9:30 [place] Record Number: [id number] Patient Account Number: [id number] Date of Birth/Gender: Treating RN: 04/13/1996 (24 y.o. [name] Primary Care Physician: Merita Norton ULT, PRO V IDER Other Clinician: Referring Physician: Treating Physician/Extender: Cheree Ditto in Treatment: 3 Education Assessment Education Provided To: Patient Education Topics Provided Wound/Skin Impairment: Methods:  Explain/Verbal Responses: State content correctly Electronic Signature(s) Signed: 01/27/2020 5:54:52 PM By: Levan Hurst RN, BSN Entered By: Levan Hurst on 01/27/2020 10:20:54 -------------------------------------------------------------------------------- Wound Assessment Details Patient Name: Date of Service: [name] Jeans MIE L. 01/27/2020 9:30 A M Medical Record Number: [id number] Patient Account Number: [id number] Date of Birth/Sex: Treating RN: 1995-12-29 (24 y.o.  Clearnce Sorrel Primary Care Santoria Chason: Merita Norton ULT, PRO V IDER Other Clinician: Referring Amaziah Raisanen: Treating Namiah Dunnavant/Extender: Cheree Ditto in Treatment: 3 Wound Status Wound Number: 1 Primary Etiology: Inflammatory Wound Location: Right, Lateral Lower Leg Wound Status: Open Wounding Event: Gradually Appeared Comorbid History: Hypertension, Lupus Erythematosus, Raynauds Date Acquired: 10/14/2019 Weeks Of Treatment: 3 Clustered Wound: No Wound Measurements Length: (cm) 1.6 Width: (cm) 1.7 Depth: (cm) 0.3 Area: (cm) 2.136 Volume: (cm) 0.641 % Reduction in Area: -39.4% % Reduction in Volume: 30.3% Epithelialization: None Tunneling: Yes Position (o'clock): 11 Maximum Distance: (cm) 0.3 Undermining: Yes Starting Position (o'clock): 3 Ending Position (o'clock): 5 Maximum Distance: (cm) 1.1 Wound Description Classification: Full Thickness Without Exposed Support Structures Wound Margin: Well defined, not attached Exudate Amount: Medium Exudate Type: Serosanguineous Exudate Color: red, brown Foul Odor After Cleansing: No Slough/Fibrino Yes Wound Bed Granulation Amount: Large (67-100%) Exposed Structure Granulation Quality: Red, Pink Fascia Exposed: No Necrotic Amount: Small (1-33%) Fat Layer (Subcutaneous Tissue) Exposed: Yes Necrotic Quality: Adherent Slough Tendon Exposed: No Muscle Exposed: No Joint Exposed: No Bone Exposed: No Treatment Notes Wound #1 (Right, Lateral Lower Leg) 1. Cleanse With Wound Cleanser 2. Periwound Care Skin Prep 3. Primary Dressing Applied Collegen AG Hydrogel or K-Y Jelly 4. Secondary Dressing Foam Border Dressing 5. Secured With Office manager) Signed: 01/27/2020 5:05:18 PM By: Kela Millin Entered By: Kela Millin on 01/27/2020 10:00:20 -------------------------------------------------------------------------------- Wound Assessment Details Patient Name: Date of  Service: [name] Jeans MIE L. 01/27/2020 9:30 A M Medical Record Number: [id number] Patient Account Number: [id number] Date of Birth/Sex: Treating RN: 01/15/1996 (24 y.o. Clearnce Sorrel Primary Care Jenay Morici: Merita Norton ULT, PRO V IDER Other Clinician: Referring Desiree Daise: Treating Grettell Ransdell/Extender: Cheree Ditto in Treatment: 3 Wound Status Wound Number: 2 Primary Etiology: Inflammatory Wound Location: Right, Distal, Lateral Lower Leg Wound Status: Open Wounding Event: Gradually Appeared Comorbid History: Hypertension, Lupus Erythematosus, Raynauds Date Acquired: 10/14/2019 Weeks Of Treatment: 3 Clustered Wound: No Wound Measurements Length: (cm) Width: (cm) Depth: (cm) Area: (cm) Volume: (cm) 0 % Reduction in Area: 100% 0 % Reduction in Volume: 100% 0 Epithelialization: Large (67-100%) 0 Tunneling: No 0 Undermining: No Wound Description Classification: Full Thickness Without Exposed Support Structures Wound Margin: Distinct, outline attached Exudate Amount: None Present Foul Odor After Cleansing: No Slough/Fibrino No Wound Bed Granulation Amount: None Present (0%) Exposed Structure Necrotic Amount: None Present (0%) Fascia Exposed: No Fat Layer (Subcutaneous Tissue) Exposed: No Tendon Exposed: No Muscle Exposed: No Joint Exposed: No Bone Exposed: No Electronic Signature(s) Signed: 01/27/2020 5:05:18 PM By: Kela Millin Entered By: Kela Millin on 01/27/2020 10:02:22 -------------------------------------------------------------------------------- [place] Details Patient Name: Date of Service: [name] Jeans MIE L. 01/27/2020 9:30 A M Medical Record Number: [id number] Patient Account Number: [id number] Date of Birth/Sex: Treating RN: 1995/09/18 (24 y.o. Clearnce Sorrel Primary Care Orlean Holtrop: Merita Norton ULT, PRO V IDER Other Clinician: Referring Menelik Mcfarren: Treating Nylee Barbuto/Extender: Cheree Ditto in Treatment: 3 Vital Signs Time Taken:  09:50 Temperature (F): 98.5 Height (in): 63 Pulse (bpm): 124 Weight (lbs): 152 Respiratory Rate (breaths/min): 18 Body Mass Index (BMI): 26.9 Blood Pressure (mmHg): 132/84 Reference Range: 80 - 120 mg / dl Electronic Signature(s) Signed:  01/27/2020 5:05:18 PM By: Kela Millin Entered By: Kela Millin on 01/27/2020 09:54:27

## 2020-01-27 NOTE — Progress Notes (Signed)
Strohmeyer, Oasis ([id number]) Visit Report for 01/27/2020 HPI Details Patient Name: Date of Service: [name] [name], [name] [name] 01/27/2020 9:30 A M Medical Record Number: [id number] Patient Account Number: [id number] Date of Birth/Sex: Treating RN: 05/05/1996 (24 y.o. [name] Primary Care Provider: Merita Norton ULT, PRO V IDER Other Clinician: Referring Provider: Treating Provider/Extender: Cheree Ditto in Treatment: 3 History of Present Illness HPI Description: ADMISSION 01/06/2020 This is a complex 24 year old woman who arrives in clinic today for review of wounds on the right lateral leg. She was referred by her rheumatologist Dr. [name] at [facility]. The patient has a history of systemic lupus at age 24 with positive ANA positive double-stranded DNA positive SSA. She also has a history of Raynaud's phenomenon, lupus nephritis stage III previously biopsied. She also has a history of an extensive DVT and apparently pulmonary embolism in September 2020 and she has remained on Xarelto since then. Finally she has a history anticardiolipin antibody positive and positive lupus anticoagulant She tells me that she noted 2 skin lesions on the right lateral calf the larger one proximally and the smaller one medially sometime in January. She apparently was seen in the ER at the time felt to possibly have a current DVT in the leg. Apparently that was worked up and was negative. She was admitted to hospital from 3/26 through 3/29 and was felt to have a flare of her lupus. She was given Augmentin apparently for possible cellulitis in the right lower extremity. She was back in the ER on 3/29 the day she was discharged with pain in the leg. The patient states that the areas currently seen opened in February. She would been putting alcohol on it I think Dr. [name] recently gave her mupirocin. Past medical history; the patient is a non-smoker. She has never been pregnant. As noted she has a significant history of  venous thromboembolic disease although her recent DVT rule out was negative. She has hypertension, lupus nephritis Raynaud's syndrome and depression. She has Medicaid pending which we will have to keep in mind when we prescribe either drugs or wound care products. There currently paying for a lot of this out-of-pocket with help from family members ABI in our clinic was 0.89 on the right 01/13/2020 the patient has 2 areas on the right lateral calf with most significant one is proximal and a very tiny one down towards the ankle. 5/10; ABI on the right at 0.92 however TBI at 0.37 and only monophasic and biphasic waveforms. On the left waveforms were triphasic ABI at 1.12 TBI of 0.54. Although the suggest mild lower extremity arterial disease this woman is only 24 years old I would like her seen by vascular surgery. She has lupus and has had a history of DVT I remember correctly she has anticardiolipin antibody and positive lupus anticoagulant. If we can prove she has arterial disease I will . talk to her rheumatologist about whether she needs to see hematology. I have not given up on the idea of biopsying this although she is on Xarelto. 5/17; she missed her vascular appointment and now has one on 6/11. This is unfortunate. I am looking for some comment about her arterial supply to the foot and whether she has arterial insufficiency at age 51. Her wound actually has come in and there is less depth. She still has undermining areas at roughly 4-6 o'clock and at 12:00. Electronic Signature(s) Signed: 01/27/2020 4:51:55 PM By: Linton Ham MD Entered By: Linton Ham on 01/27/2020 11:24:16 --------------------------------------------------------------------------------  Physical Exam Details Patient Name: Date of Service: [name], [name] 01/27/2020 9:30 A M Medical Record Number: [id number] Patient Account Number: [id number] Date of Birth/Sex: Treating RN: [birth year] (24 y.o. [name] Primary Care Provider: Merita Norton ULT, PRO V IDER Other Clinician: Referring Provider: Treating Provider/Extender: Cheree Ditto in Treatment: 3 Constitutional Sitting or standing Blood Pressure is within target range for patient.. Pulse regular and within target range for patient.Marland Kitchen Respirations regular, non-labored and within target range.. Temperature is normal and within the target range for the patient.Marland Kitchen Appears in no distress. Eyes Conjunctivae clear. No discharge.no icterus. Respiratory work of breathing is normal. Cardiovascular Gluteal and femoral pulses are palpable. I cannot feel her posterior tibial or dorsalis pedis pulses in her right foot. Notes Wound exam; the distal wound on the right lateral calf is healed. This was the most benign-looking area at the start. The major wound is more proximal. The depth of this appears to have filled in nicely. Appearance of the tissue is also quite vibrant with healthy granulation. She still has an undermining area from about 4-7 o'clock which is quite pronounced smaller area at about 12:00. There is no evidence of infection Electronic Signature(s) Signed: 01/27/2020 4:51:55 PM By: Linton Ham MD Entered By: Linton Ham on 01/27/2020 11:27:13 -------------------------------------------------------------------------------- Physician Orders Details Patient Name: Date of Service: [name] 01/27/2020 9:30 A M Medical Record Number: [id number] Patient Account Number: [id number] Date of Birth/Sex: Treating RN: 02-25-1996 (24 y.o. 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 Dressing Change Frequency Wound #1 Right,Lateral Lower Leg Change Dressing every other day. Wound Cleansing Wound #1 Right,Lateral Lower Leg May shower and wash wound with soap and water. - on days that dressing is changed Primary Wound Dressing Wound #1 Right,Lateral Lower Leg Silver Collagen - moisten with hydrogel or KY jelly Secondary Dressing Wound #1 Right,Lateral Lower Leg Foam Border - or bandaid Electronic Signature(s) Signed: 01/27/2020 4:51:55 PM By: Linton Ham MD Signed: 01/27/2020 5:54:52 PM By: Levan Hurst RN, BSN Entered By: Levan Hurst on 01/27/2020 10:44:47 -------------------------------------------------------------------------------- Problem List Details Patient Name: Date of Service: [name] 01/27/2020 9:30 A M Medical Record Number: [id number] Patient Account Number: [id number] Date of Birth/Sex: Treating RN: 21-Jan-1996 (24 y.o. [name] Primary Care Provider: Merita Norton ULT, PRO Ave Filter Other Clinician: Referring Provider: Treating Provider/Extender: Cheree Ditto in Treatment: 3 Active Problems ICD-10 Encounter Code Description Active Date MDM Diagnosis L97.212 Non-pressure chronic ulcer of right calf with fat layer exposed 01/06/2020 No Yes L97.211 Non-pressure chronic ulcer of right calf limited to breakdown of skin 01/06/2020 No Yes M32.19 Other organ or system involvement in systemic lupus erythematosus 01/06/2020 No Yes I73.00 Raynaud's syndrome without gangrene 01/06/2020 No Yes Inactive Problems Resolved Problems Electronic Signature(s) Signed: 01/27/2020 4:51:55 PM By: Linton Ham MD Entered By: Linton Ham on 01/27/2020 11:22:22 -------------------------------------------------------------------------------- Progress Note Details Patient  Name: Date of Service: [name] 01/27/2020 9:30 A M Medical Record Number: [id number] Patient Account Number: [id number] Date of Birth/Sex: Treating RN: 07/27/1996 (24 y.o. Benjamine Sprague, Briant Cedar  Primary Care Provider: DEFA ULT, PRO V IDER Other Clinician: Referring Provider: Treating Provider/Extender: Cheree Ditto in Treatment: 3 Subjective History of Present Illness (HPI) ADMISSION 01/06/2020 This is a complex 24 year old woman who arrives in clinic today for review of wounds on the right lateral leg. She was referred by her rheumatologist Dr. [name] at [facility]. The patient has a history of systemic lupus at age 5 with positive ANA positive double-stranded DNA positive SSA. She also has a history of Raynaud's phenomenon, lupus nephritis stage III previously biopsied. She also has a history of an extensive DVT and apparently pulmonary embolism in September 2020 and she has remained on Xarelto since then. Finally she has a history anticardiolipin antibody positive and positive lupus anticoagulant She tells me that she noted 2 skin lesions on the right lateral calf the larger one proximally and the smaller one medially sometime in January. She apparently was seen in the ER at the time felt to possibly have a current DVT in the leg. Apparently that was worked up and was negative. She was admitted to hospital from 3/26 through 3/29 and was felt to have a flare of her lupus. She was given Augmentin apparently for possible cellulitis in the right lower extremity. She was back in the ER on 3/29 the day she was discharged with pain in the leg. The patient states that the areas currently seen opened in February. She would been putting alcohol on it I think Dr. [name] recently gave her mupirocin. Past medical history; the patient is a non-smoker. She has never been pregnant. As noted she has a significant history of venous thromboembolic disease although her recent DVT rule out was negative. She  has hypertension, lupus nephritis Raynaud's syndrome and depression. She has Medicaid pending which we will have to keep in mind when we prescribe either drugs or wound care products. There currently paying for a lot of this out-of-pocket with help from family members ABI in our clinic was 0.89 on the right 01/13/2020 the patient has 2 areas on the right lateral calf with most significant one is proximal and a very tiny one down towards the ankle. 5/10; ABI on the right at 0.92 however TBI at 0.37 and only monophasic and biphasic waveforms. On the left waveforms were triphasic ABI at 1.12 TBI of 0.54. Although the suggest mild lower extremity arterial disease this woman is only 24 years old I would like her seen by vascular surgery. She has lupus and has had a history of DVT I remember correctly she has anticardiolipin antibody and positive lupus anticoagulant. If we can prove she has arterial disease I will . talk to her rheumatologist about whether she needs to see hematology. I have not given up on the idea of biopsying this although she is on Xarelto. 5/17; she missed her vascular appointment and now has one on 6/11. This is unfortunate. I am looking for some comment about her arterial supply to the foot and whether she has arterial insufficiency at age 79. Her wound actually has come in and there is less depth. She still has undermining areas at roughly 4-6 o'clock and at 12:00. Objective Constitutional Sitting or standing Blood Pressure is within target range for patient.. Pulse regular and within target range for patient.Marland Kitchen Respirations regular, non-labored and within target range.. Temperature is normal and within the target range for the patient.Marland Kitchen Appears in no distress. Vitals Time Taken: 9:50 AM, Height: 63 in, Weight: 152 lbs, BMI: 26.9, Temperature: 98.5 F, Pulse:  124 bpm, Respiratory Rate: 18 breaths/min, Blood Pressure: 132/84 mmHg. Eyes Conjunctivae clear. No discharge.no  icterus. Respiratory work of breathing is normal. Cardiovascular Gluteal and femoral pulses are palpable. I cannot feel her posterior tibial or dorsalis pedis pulses in her right foot. General Notes: Wound exam; the distal wound on the right lateral calf is healed. This was the most benign-looking area at the start. ooThe major wound is more proximal. The depth of this appears to have filled in nicely. Appearance of the tissue is also quite vibrant with healthy granulation. She still has an undermining area from about 4-7 o'clock which is quite pronounced smaller area at about 12:00. There is no evidence of infection Integumentary (Hair, Skin) Wound #1 status is Open. Original cause of wound was Gradually Appeared. The wound is located on the Right,Lateral Lower Leg. The wound measures 1.6cm length x 1.7cm width x 0.3cm depth; 2.136cm^2 area and 0.641cm^3 volume. There is Fat Layer (Subcutaneous Tissue) Exposed exposed. Tunneling has been noted at 11:00 with a maximum distance of 0.3cm. Undermining begins at 3:00 and ends at 5:00 with a maximum distance of 1.1cm. There is a medium amount of serosanguineous drainage noted. The wound margin is well defined and not attached to the wound base. There is large (67-100%) red, pink granulation within the wound bed. There is a small (1-33%) amount of necrotic tissue within the wound bed including Adherent Slough. Wound #2 status is Open. Original cause of wound was Gradually Appeared. The wound is located on the Right,Distal,Lateral Lower Leg. The wound measures 0cm length x 0cm width x 0cm depth; 0cm^2 area and 0cm^3 volume. There is no tunneling or undermining noted. There is a none present amount of drainage noted. The wound margin is distinct with the outline attached to the wound base. There is no granulation within the wound bed. There is no necrotic tissue within the wound bed. Assessment Active Problems ICD-10 Non-pressure chronic ulcer of right  calf with fat layer exposed Non-pressure chronic ulcer of right calf limited to breakdown of skin Other organ or system involvement in systemic lupus erythematosus Raynaud's syndrome without gangrene Plan Follow-up Appointments: Return Appointment in 2 weeks. Dressing Change Frequency: Wound #1 Right,Lateral Lower Leg: Change Dressing every other day. Wound Cleansing: Wound #1 Right,Lateral Lower Leg: May shower and wash wound with soap and water. - on days that dressing is changed Primary Wound Dressing: Wound #1 Right,Lateral Lower Leg: Silver Collagen - moisten with hydrogel or KY jelly Secondary Dressing: Wound #1 Right,Lateral Lower Leg: Foam Border - or bandaid 1. Continue with silver collagen. We had nice improvements in wound depth and appearance of the wound surface 2. Still awaiting from a vascular surgery opinion 3. Based on #2 I would consider calling her rheumatologist about this and/or an academic hematology appointment if there is here a few double evidence of coexistent arterial disease here. 4. The wound appears to be much more shallow I am much less inclined to biopsy this Electronic Signature(s) Signed: 01/27/2020 4:51:55 PM By: Linton Ham MD Entered By: Linton Ham on 01/27/2020 11:28:32 -------------------------------------------------------------------------------- SuperBill Details Patient Name: Date of Service: [name] 01/27/2020 Medical Record Number: [id number] Patient Account Number: [id number] Date of Birth/Sex: Treating RN: November 30, 1995 (24 y.o. [name] Primary Care Provider: Merita Norton ULT, PRO Ave Filter Other Clinician: Referring Provider: Treating Provider/Extender: Cheree Ditto in Treatment: 3 Diagnosis Coding ICD-10 Codes Code Description [phone] Non-pressure chronic ulcer of right calf with fat layer exposed L97.211 Non-pressure chronic ulcer  of right calf limited to breakdown of skin M32.19 Other organ or system  involvement in systemic lupus erythematosus I73.00 Raynaud's syndrome without gangrene Facility Procedures CPT4 Code: [id number] Description: [id number] - WOUND CARE VISIT-LEV 3 EST PT Modifier: Quantity: 1 Physician Procedures : CPT4 Code Description Modifier [id number] - WC PHYS LEVEL 3 - EST PT ICD-10 Diagnosis Description [id number] Non-pressure chronic ulcer of right calf with fat layer exposed Quantity: 1 Electronic Signature(s) Signed: 01/27/2020 4:51:55 PM By: Linton Ham MD Entered By: Linton Ham on 01/27/2020 11:28:53

## 2020-01-28 ENCOUNTER — Ambulatory Visit (INDEPENDENT_AMBULATORY_CARE_PROVIDER_SITE_OTHER): Payer: Self-pay | Admitting: Family Medicine

## 2020-01-28 ENCOUNTER — Other Ambulatory Visit: Payer: Self-pay

## 2020-01-28 ENCOUNTER — Encounter: Payer: Self-pay | Admitting: Family Medicine

## 2020-01-28 VITALS — Ht 63.0 in | Wt 139.0 lb

## 2020-01-28 DIAGNOSIS — I10 Essential (primary) hypertension: Secondary | ICD-10-CM

## 2020-01-29 ENCOUNTER — Other Ambulatory Visit: Payer: Self-pay

## 2020-01-29 ENCOUNTER — Other Ambulatory Visit (INDEPENDENT_AMBULATORY_CARE_PROVIDER_SITE_OTHER): Payer: Self-pay

## 2020-01-29 DIAGNOSIS — N898 Other specified noninflammatory disorders of vagina: Secondary | ICD-10-CM

## 2020-01-29 DIAGNOSIS — N760 Acute vaginitis: Secondary | ICD-10-CM

## 2020-01-29 DIAGNOSIS — I10 Essential (primary) hypertension: Secondary | ICD-10-CM

## 2020-01-29 LAB — POCT URINALYSIS DIPSTICK
Bilirubin, UA: NEGATIVE
Glucose, UA: NEGATIVE
Ketones, UA: NEGATIVE
Nitrite, UA: NEGATIVE
Protein, UA: POSITIVE — AB
Spec Grav, UA: 1.025 (ref 1.010–1.025)
Urobilinogen, UA: 0.2 E.U./dL
pH, UA: 7 (ref 5.0–8.0)

## 2020-01-29 NOTE — Progress Notes (Signed)
Patient has an appointment on 02/04/2020. Today she had UA with culture & vag swab.

## 2020-01-31 LAB — URINE CULTURE

## 2020-02-01 ENCOUNTER — Other Ambulatory Visit: Payer: Self-pay | Admitting: Family Medicine

## 2020-02-01 DIAGNOSIS — B379 Candidiasis, unspecified: Secondary | ICD-10-CM

## 2020-02-01 LAB — NUSWAB VAGINITIS PLUS (VG+)
Candida albicans, NAA: POSITIVE — AB
Candida glabrata, NAA: NEGATIVE
Chlamydia trachomatis, NAA: NEGATIVE
Neisseria gonorrhoeae, NAA: NEGATIVE
Trich vag by NAA: NEGATIVE

## 2020-02-02 ENCOUNTER — Other Ambulatory Visit: Payer: Self-pay | Admitting: Family Medicine

## 2020-02-02 DIAGNOSIS — B3749 Other urogenital candidiasis: Secondary | ICD-10-CM

## 2020-02-02 MED ORDER — FLUCONAZOLE 150 MG PO TABS: 150.0000 mg | ORAL_TABLET | Freq: Once | ORAL | 0 refills | Status: AC

## 2020-02-02 NOTE — Progress Notes (Signed)
Meds ordered this encounter  Medications  . fluconazole (DIFLUCAN) 150 MG tablet    Sig: Take 1 tablet (150 mg total) by mouth once for 1 dose.    Dispense:  1 tablet    Refill:  0    Order Specific Question:   Supervising Provider    Answer:   Tresa Garter [id number]    Donia Pounds  APRN, MSN, FNP-C Patient [facility] [address] [place], [place] [address] [phone]

## 2020-02-02 NOTE — Progress Notes (Signed)
Appointment rescheduled.   Donia Pounds  APRN, MSN, FNP-C Patient [name] [name] [address] [place], [place] [address] [phone]

## 2020-02-03 ENCOUNTER — Telehealth: Payer: Self-pay

## 2020-02-03 ENCOUNTER — Other Ambulatory Visit: Payer: Self-pay | Admitting: Nurse Practitioner

## 2020-02-03 NOTE — Telephone Encounter (Signed)
Patient requesting Prednisone 20 MG

## 2020-02-03 NOTE — Telephone Encounter (Signed)
===  View-only below this line=== ----- Message ----- From: Dorena Dew, FNP Sent: 02/02/2020  10:24 AM EDT To: Jorja Loa, RMA Subject: lab results                                    Please inform patient that vaginal swab yielded candida, which is yeast. Diflucan 150 mg times 1 dose has been sent to patient's pharmacy. Patient's follow up appointment can be canceled. She can reschedule follow up appointment for chronic conditions to next month unless she has new complaints to discuss.   Donia Pounds  APRN, MSN, FNP-C Patient [facility] [address] [place], [place] [address]   [phone]  Patient informed. She will call back to schedule.

## 2020-02-09 MED ORDER — FLUCONAZOLE 150 MG PO TABS: 150.0000 mg | ORAL_TABLET | Freq: Once | ORAL | 0 refills | Status: AC

## 2020-02-11 ENCOUNTER — Other Ambulatory Visit: Payer: Self-pay

## 2020-02-11 ENCOUNTER — Encounter (HOSPITAL_BASED_OUTPATIENT_CLINIC_OR_DEPARTMENT_OTHER): Payer: Medicaid Other | Attending: Internal Medicine | Admitting: Internal Medicine

## 2020-02-11 DIAGNOSIS — M3214 Glomerular disease in systemic lupus erythematosus: Secondary | ICD-10-CM | POA: Insufficient documentation

## 2020-02-11 DIAGNOSIS — Z86711 Personal history of pulmonary embolism: Secondary | ICD-10-CM | POA: Insufficient documentation

## 2020-02-11 DIAGNOSIS — Z86718 Personal history of other venous thrombosis and embolism: Secondary | ICD-10-CM | POA: Insufficient documentation

## 2020-02-11 DIAGNOSIS — I7300 Raynaud's syndrome without gangrene: Secondary | ICD-10-CM | POA: Diagnosis not present

## 2020-02-11 DIAGNOSIS — F329 Major depressive disorder, single episode, unspecified: Secondary | ICD-10-CM | POA: Insufficient documentation

## 2020-02-11 DIAGNOSIS — L97211 Non-pressure chronic ulcer of right calf limited to breakdown of skin: Secondary | ICD-10-CM | POA: Insufficient documentation

## 2020-02-11 DIAGNOSIS — Z7901 Long term (current) use of anticoagulants: Secondary | ICD-10-CM | POA: Insufficient documentation

## 2020-02-11 DIAGNOSIS — L97212 Non-pressure chronic ulcer of right calf with fat layer exposed: Secondary | ICD-10-CM | POA: Insufficient documentation

## 2020-02-11 DIAGNOSIS — L97219 Non-pressure chronic ulcer of right calf with unspecified severity: Secondary | ICD-10-CM | POA: Diagnosis present

## 2020-02-11 DIAGNOSIS — I10 Essential (primary) hypertension: Secondary | ICD-10-CM | POA: Diagnosis not present

## 2020-02-12 NOTE — Progress Notes (Signed)
[name] [name] ([id number]) Visit Report for 02/11/2020 HPI Details Patient Name: Date of Service: [name], [name] 02/11/2020 9:30 A M Medical Record Number: [id number] Patient Account Number: [id number] Date of Birth/Sex: Treating RN: 1996-04-12 (24 y.o. Orvan Falconer Primary Care Provider: Merita Norton ULT, [employer] [address] [employer] Other Clinician: Referring Provider: Treating Provider/Extender: Cheree Ditto in Treatment: 5 History of Present Illness HPI Description: ADMISSION 01/06/2020 This is a complex 24 year old woman who arrives in clinic today for review of wounds on the right lateral leg. She was referred by her rheumatologist Dr. [name] at [facility]. The patient has a history of systemic lupus at age 80 with positive ANA positive double-stranded DNA positive SSA. She also has a history of Raynaud's phenomenon, lupus nephritis stage III previously biopsied. She also has a history of an extensive DVT and apparently pulmonary embolism in September 2020 and she has remained on Xarelto since then. Finally she has a history anticardiolipin antibody positive and positive lupus anticoagulant She tells me that she noted 2 skin lesions on the right lateral calf the larger one proximally and the smaller one medially sometime in January. She apparently was seen in the ER at the time felt to possibly have a current DVT in the leg. Apparently that was worked up and was negative. She was admitted to hospital from 3/26 through 3/29 and was felt to have a flare of her lupus. She was given Augmentin apparently for possible cellulitis in the right lower extremity. She was back in the ER on 3/29 the day she was discharged with pain in the leg. The patient states that the areas currently seen opened in February. She would been putting alcohol on it I think Dr. [name] recently gave her mupirocin. Past medical history; the patient is a non-smoker. She has never been pregnant. As noted she has a significant history of venous  thromboembolic disease although her recent DVT rule out was negative. She has hypertension, lupus nephritis Raynaud's syndrome and depression. She has Medicaid pending which we will have to keep in mind when we prescribe either drugs or wound care products. There currently paying for a lot of this out-of-pocket with help from family members ABI in our clinic was 0.89 on the right 01/13/2020 the patient has 2 areas on the right lateral calf with most significant one is proximal and a very tiny one down towards the ankle. 5/10; ABI on the right at 0.92 however TBI at 0.37 and only monophasic and biphasic waveforms. On the left waveforms were triphasic ABI at 1.12 TBI of 0.54. Although the suggest mild lower extremity arterial disease this woman is only 24 years old I would like her seen by vascular surgery. She has lupus and has had a history of DVT I remember correctly she has anticardiolipin antibody and positive lupus anticoagulant. If we can prove she has arterial disease I will . talk to her rheumatologist about whether she needs to see hematology. I have not given up on the idea of biopsying this although she is on Xarelto. 5/17; she missed her vascular appointment and now has one on 6/11. This is unfortunate. I am looking for some comment about her arterial supply to the foot and whether she has arterial insufficiency at age 61. Her wound actually has come in and there is less depth. She still has undermining areas at roughly 4-6 o'clock and at 12:00. 6/1; vascular appointment on 6/11. She has been using silver collagen and border foam her wound looks as  though it is doing quite nicely. Everything is closed down there is no undermining. She still has roughly "6 I isle" claudication when going up and down the [place] at the grocery store Electronic Signature(s) Signed: 02/11/2020 5:52:05 PM By: Linton Ham MD Entered By: Linton Ham on 02/11/2020  11:00:58 -------------------------------------------------------------------------------- Physical Exam Details Patient Name: Date of Service: [name] Jeans MIE L. 02/11/2020 9:30 A M Medical Record Number: [id number] Patient Account Number: [id number] Date of Birth/Sex: Treating RN: [birth year] (24 y.o. Orvan Falconer Primary Care Provider: Merita Norton ULT, [employer] [address] [employer] Other Clinician: Referring Provider: Treating Provider/Extender: Cheree Ditto in Treatment: 5 Constitutional Sitting or standing Blood Pressure is within target range for patient.. Pulse regular and within target range for patient.Marland Kitchen Respirations regular, non-labored and within target range.. Temperature is normal and within the target range for the patient.Marland Kitchen Appears in no distress. Respiratory work of breathing is normal. Cardiovascular Not palpable in the right dorsalis pedis or posterior tibial. No swelling in the right leg. Notes Wound exam; the distal wound on the right lateral calf is still healed. Her wound under illumination is healthy healthy looking granulation with epithelialization. I still cannot feel either her of her peripheral pulses on the right and the right foot. Electronic Signature(s) Signed: 02/11/2020 5:52:05 PM By: Linton Ham MD Entered By: Linton Ham on 02/11/2020 11:02:07 -------------------------------------------------------------------------------- Physician Orders Details Patient Name: Date of Service: [name] Jeans MIE L. 02/11/2020 9:30 A M Medical Record Number: [id number] Patient Account Number: [id number] Date of Birth/Sex: Treating RN: [birth year] (24 y.o. 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 Dressing Change Frequency Wound #1 Right,Lateral Lower Leg Change Dressing every other day. Wound Cleansing Wound #1 Right,Lateral Lower Leg May shower and wash wound with soap and water. - on days that dressing is changed Primary Wound Dressing Wound #1 Right,Lateral Lower Leg Silver Collagen - moisten with hydrogel or KY jelly Secondary Dressing Wound #1 Right,Lateral Lower Leg Foam Border - or bandaid Electronic Signature(s) Signed: 02/11/2020 5:52:05 PM By: Linton Ham MD Signed: 02/12/2020 4:36:43 PM By: Carlene Coria RN Entered By: Carlene Coria on 02/11/2020 10:44:04 -------------------------------------------------------------------------------- Problem List Details Patient Name: Date of Service: [name] Jeans MIE L. 02/11/2020 9:30 A M Medical Record Number: [id number] Patient Account Number: [id number] Date of Birth/Sex: Treating RN: [birth year] (24 y.o. Orvan Falconer Primary Care Provider: Merita Norton ULT, [employer] [address] [employer] Other Clinician: Referring Provider: Treating Provider/Extender: Cheree Ditto in Treatment: 5 Active Problems ICD-10 Encounter Code Description Active Date MDM Diagnosis L97.212 Non-pressure chronic ulcer of right calf with fat layer exposed 01/06/2020 No Yes L97.211 Non-pressure chronic ulcer of right calf limited to breakdown of skin 01/06/2020 No Yes M32.19 Other organ or system involvement in systemic lupus erythematosus 01/06/2020 No Yes I73.00 Raynaud's syndrome without gangrene 01/06/2020 No Yes Inactive Problems Resolved Problems Electronic Signature(s) Signed: 02/11/2020 5:52:05 PM By: Linton Ham MD Entered By: Linton Ham on 02/11/2020  10:59:50 -------------------------------------------------------------------------------- Progress Note Details Patient Name: Date of Service: [name] Jeans MIE L. 02/11/2020 9:30 A M Medical Record Number: [id number] Patient Account Number: [id number] Date of Birth/Sex:  Treating RN: 1995/11/20 (24 y.o. Orvan Falconer Primary Care Provider: Merita Norton ULT, [employer] [address] [employer] Other Clinician: Referring Provider: Treating Provider/Extender: Cheree Ditto in Treatment: 5 Subjective History of Present Illness (HPI) ADMISSION 01/06/2020 This is a complex 24 year old woman who arrives in clinic today for review of wounds on the right lateral leg. She was referred by her rheumatologist Dr. [name] at [facility]. The patient has a history of systemic lupus at age 17 with positive ANA positive double-stranded DNA positive SSA. She also has a history of Raynaud's phenomenon, lupus nephritis stage III previously biopsied. She also has a history of an extensive DVT and apparently pulmonary embolism in September 2020 and she has remained on Xarelto since then. Finally she has a history anticardiolipin antibody positive and positive lupus anticoagulant She tells me that she noted 2 skin lesions on the right lateral calf the larger one proximally and the smaller one medially sometime in January. She apparently was seen in the ER at the time felt to possibly have a current DVT in the leg. Apparently that was worked up and was negative. She was admitted to hospital from 3/26 through 3/29 and was felt to have a flare of her lupus. She was given Augmentin apparently for possible cellulitis in the right lower extremity. She was back in the ER on 3/29 the day she was discharged with pain in the leg. The patient states that the areas currently seen opened in February. She would been putting alcohol on it I think Dr. [name] recently gave her mupirocin. Past medical history; the patient is a non-smoker. She has never been pregnant. As  noted she has a significant history of venous thromboembolic disease although her recent DVT rule out was negative. She has hypertension, lupus nephritis Raynaud's syndrome and depression. She has Medicaid pending which we will have to keep in mind when we prescribe either drugs or wound care products. There currently paying for a lot of this out-of-pocket with help from family members ABI in our clinic was 0.89 on the right 01/13/2020 the patient has 2 areas on the right lateral calf with most significant one is proximal and a very tiny one down towards the ankle. 5/10; ABI on the right at 0.92 however TBI at 0.37 and only monophasic and biphasic waveforms. On the left waveforms were triphasic ABI at 1.12 TBI of 0.54. Although the suggest mild lower extremity arterial disease this woman is only 24 years old I would like her seen by vascular surgery. She has lupus and has had a history of DVT I remember correctly she has anticardiolipin antibody and positive lupus anticoagulant. If we can prove she has arterial disease I will . talk to her rheumatologist about whether she needs to see hematology. I have not given up on the idea of biopsying this although she is on Xarelto. 5/17; she missed her vascular appointment and now has one on 6/11. This is unfortunate. I am looking for some comment about her arterial supply to the foot and whether she has arterial insufficiency at age 74. Her wound actually has come in and there is less depth. She still has undermining areas at roughly 4-6 o'clock and at 12:00. 6/1; vascular appointment on 6/11. She has been using silver collagen and border foam her wound looks as though it is doing quite nicely. Everything is closed down there is no undermining. She still has roughly "6 I isle" claudication when going up and down the [place] at the grocery store Objective  Constitutional Sitting or standing Blood Pressure is within target range for patient.. Pulse regular and  within target range for patient.Marland Kitchen Respirations regular, non-labored and within target range.. Temperature is normal and within the target range for the patient.Marland Kitchen Appears in no distress. Vitals Time Taken: 9:53 AM, Height: 63 in, Weight: 152 lbs, BMI: 26.9, Temperature: 98.4 F, Pulse: 108 bpm, Respiratory Rate: 18 breaths/min, Blood Pressure: 118/74 mmHg. Respiratory work of breathing is normal. Cardiovascular Not palpable in the right dorsalis pedis or posterior tibial. No swelling in the right leg. General Notes: Wound exam; the distal wound on the right lateral calf is still healed. Her wound under illumination is healthy healthy looking granulation with epithelialization. I still cannot feel either her of her peripheral pulses on the right and the right foot. Integumentary (Hair, Skin) Wound #1 status is Open. Original cause of wound was Gradually Appeared. The wound is located on the Right,Lateral Lower Leg. The wound measures 1.4cm length x 1.2cm width x 0.1cm depth; 1.319cm^2 area and 0.132cm^3 volume. There is Fat Layer (Subcutaneous Tissue) Exposed exposed. There is no tunneling or undermining noted. There is a medium amount of serosanguineous drainage noted. The wound margin is well defined and not attached to the wound base. There is large (67-100%) red, pink granulation within the wound bed. There is a small (1-33%) amount of necrotic tissue within the wound bed including Adherent Slough. Assessment Active Problems ICD-10 Non-pressure chronic ulcer of right calf with fat layer exposed Non-pressure chronic ulcer of right calf limited to breakdown of skin Other organ or system involvement in systemic lupus erythematosus Raynaud's syndrome without gangrene Plan Follow-up Appointments: Return Appointment in 2 weeks. Dressing Change Frequency: Wound #1 Right,Lateral Lower Leg: Change Dressing every other day. Wound Cleansing: Wound #1 Right,Lateral Lower Leg: May shower and  wash wound with soap and water. - on days that dressing is changed Primary Wound Dressing: Wound #1 Right,Lateral Lower Leg: Silver Collagen - moisten with hydrogel or KY jelly Secondary Dressing: Wound #1 Right,Lateral Lower Leg: Foam Border - or bandaid 1. I am continuing her silver collagen and border foam. 2. Vascular surgery on 6/11 to comment on her arterial insufficiency/claudication 3. Then I will go back to her rheumatologist and/or academic hematology Electronic Signature(s) Signed: 02/11/2020 5:52:05 PM By: Linton Ham MD Entered By: Linton Ham on 02/11/2020 11:03:10 -------------------------------------------------------------------------------- SuperBill Details Patient Name: Date of Service: [name] Jeans MIE L. 02/11/2020 Medical Record Number: [id number] Patient Account Number: [id number] Date of Birth/Sex: Treating RN: 06-20-1996 (24 y.o. Orvan Falconer Primary Care Provider: Merita Norton ULT, PRO V IDER Other Clinician: Referring Provider: Treating Provider/Extender: Cheree Ditto in Treatment: 5 Diagnosis Coding ICD-10 Codes Code Description [phone] Non-pressure chronic ulcer of right calf with fat layer exposed L97.211 Non-pressure chronic ulcer of right calf limited to breakdown of skin M32.19 Other organ or system involvement in systemic lupus erythematosus I73.00 Raynaud's syndrome without gangrene Facility Procedures CPT4 Code: [id number] Description: 99213 - WOUND CARE VISIT-LEV 3 EST PT Modifier: Quantity: 1 Physician Procedures : CPT4 Code Description Modifier [id number] - WC PHYS LEVEL 3 - EST PT ICD-10 Diagnosis Description L97.212 Non-pressure chronic ulcer of right calf with fat layer exposed M32.19 Other organ or system involvement in systemic lupus erythematosus I73.00  Raynaud's syndrome without gangrene Quantity: 1 Electronic Signature(s) Signed: 02/11/2020 5:52:05 PM By: Linton Ham MD Entered By: Linton Ham on 02/11/2020  11:03:35

## 2020-02-13 NOTE — Progress Notes (Signed)
Arroyave, Chase ([id number]) Visit Report for 02/11/2020 Arrival Information Details Patient Name: Date of Service: [name] [name], [name] [name] 02/11/2020 9:30 A M Medical Record Number: [id number] Patient Account Number: [id number] Date of Birth/Sex: Treating RN: Jan 10, 1996 (24 y.o. Orvan Falconer Primary Care Elleana Stillson: Merita Norton ULT, PRO V IDER Other Clinician: Referring Reagen Haberman: Treating Lowry Bala/Extender: Cheree Ditto in Treatment: 5 Visit Information History Since Last Visit Added or deleted any medications: No Patient Arrived: Walker Any new allergies or adverse reactions: No Arrival Time: 09:51 Had a fall or experienced change in No Accompanied By: self activities of daily living that may affect Transfer Assistance: None risk of falls: Patient Identification Verified: Yes Signs or symptoms of abuse/neglect since last visito No Secondary Verification Process Completed: Yes Hospitalized since last visit: No Patient Requires Transmission-Based Precautions: No Implantable device outside of the clinic excluding No Patient Has Alerts: No cellular tissue based products placed in the center since last visit: Has Dressing in Place as Prescribed: Yes Pain Present Now: Yes Electronic Signature(s) Signed: 02/13/2020 9:12:52 AM By: Sandre Kitty Entered By: Sandre Kitty on 02/11/2020 09:53:54 -------------------------------------------------------------------------------- Clinic Level of Care Assessment Details Patient Name: Date of Service: [name] [name], [name] [name] 02/11/2020 9:30 A M Medical Record Number: [id number] Patient Account Number: [id number] Date of Birth/Sex: Treating RN: 10/04/1995 (24 y.o. Orvan Falconer Primary Care Rocky Rishel: Merita Norton ULT, PRO V IDER Other Clinician: Referring Toshiye Kever: Treating Kimbree Casanas/Extender: Cheree Ditto in Treatment: 5 Clinic Level of Care Assessment Items TOOL 4 Quantity Score X- 1 0 Use when only an EandM is performed on FOLLOW-UP  visit ASSESSMENTS - Nursing Assessment / Reassessment X- 1 10 Reassessment of Co-morbidities (includes updates in patient status) X- 1 5 Reassessment of Adherence to Treatment Plan ASSESSMENTS - Wound and Skin A ssessment / Reassessment X - Simple Wound Assessment / Reassessment - one wound 1 5 _0  - 0 Complex Wound Assessment / Reassessment - multiple wounds _1  - 0 Dermatologic / Skin Assessment (not related to wound area) ASSESSMENTS - Focused Assessment _2  - 0 Circumferential Edema Measurements - multi extremities _3  - 0 Nutritional Assessment / Counseling / Intervention _4  - 0 Lower Extremity Assessment (monofilament, tuning fork, pulses) _5  - 0 Peripheral Arterial Disease Assessment (using hand held doppler) ASSESSMENTS - Ostomy and/or Continence Assessment and Care _6  - 0 Incontinence Assessment and Management _7  - 0 Ostomy Care Assessment and Management (repouching, etc.) PROCESS - Coordination of Care X - Simple Patient / Family Education for ongoing care 1 15 _8  - 0 Complex (extensive) Patient / Family Education for ongoing care X- 1 10 Staff obtains Programmer, systems, Records, T Results / Process Orders est _9  - 0 Staff telephones HHA, Nursing Homes / Clarify orders / etc _10  - 0 Routine Transfer to another Facility (non-emergent condition) _11  - 0 Routine Hospital Admission (non-emergent condition) _12  - 0 New Admissions / Biomedical engineer / Ordering NPWT Apligraf, etc. , _13  - 0 Emergency Hospital Admission (emergent condition) X- 1 10 Simple Discharge Coordination _14  - 0 Complex (extensive) Discharge Coordination PROCESS - Special Needs _15  - 0 Pediatric / Minor Patient Management _16  - 0 Isolation Patient Management _17  - 0 Hearing / Language / Visual special needs _18  - 0 Assessment of Community assistance (transportation, D/C planning, etc.) _19  - 0 Additional assistance / Altered mentation _20  - 0 Support Surface(s) Assessment (bed, cushion, seat,  etc.) INTERVENTIONS - Wound Cleansing / Measurement X - Simple Wound Cleansing - one wound 1 5 _21  - 0 Complex Wound Cleansing -  multiple wounds X- 1 5 Wound Imaging (photographs - any number of wounds) _0  - 0 Wound Tracing (instead of photographs) X- 1 5 Simple Wound Measurement - one wound _1  - 0 Complex Wound Measurement - multiple wounds INTERVENTIONS - Wound Dressings X - Small Wound Dressing one or multiple wounds 1 10 _2  - 0 Medium Wound Dressing one or multiple wounds _3  - 0 Large Wound Dressing one or multiple wounds X- 1 5 Application of Medications - topical <[id number]>_1<\/[id number]>_0  - 0 Application of Medications - injection INTERVENTIONS - Miscellaneous _5  - 0 External ear exam _6  - 0 Specimen Collection (cultures, biopsies, blood, body fluids, etc.) _7  - 0 Specimen(s) / Culture(s) sent or taken to Lab for analysis _8  - 0 Patient Transfer (multiple staff / Civil Service fast streamer / Similar devices) _9  - 0 Simple Staple / Suture removal (25 or less) _10  - 0 Complex Staple / Suture removal (26 or more) _11  - 0 Hypo / Hyperglycemic Management (close monitor of Blood Glucose) _12  - 0 Ankle / Brachial Index (ABI) - do not check if billed separately X- 1 5 Vital Signs Has the patient been seen at the hospital within the last three years: Yes Total Score: 90 Level Of Care: New/Established - Level 3 Electronic Signature(s) Signed: 02/12/2020 4:36:43 PM By: Carlene Coria RN Entered By: Carlene Coria on 02/11/2020 10:57:35 -------------------------------------------------------------------------------- Encounter Discharge Information Details Patient Name: Date of Service: [name] Jeans MIE L. 02/11/2020 9:30 A M Medical Record Number: [id number] Patient Account Number: [id number] Date of Birth/Sex: Treating RN: 1996-04-16 (24 y.o. Clearnce Sorrel Primary Care Krystie Leiter: Merita Norton ULT, PRO Ave Filter Other Clinician: Referring Jenaye Rickert: Treating Kanon Colunga/Extender: Cheree Ditto in Treatment: 5 Encounter  Discharge Information Items Discharge Condition: Stable Ambulatory Status: Walker Discharge Destination: Home Transportation: Private Auto Accompanied By: mom Schedule Follow-up Appointment: Yes Clinical Summary of Care: Patient Declined Electronic Signature(s) Signed: 02/12/2020 7:13:26 AM By: Kela Millin Entered By: Kela Millin on 02/11/2020 11:09:18 -------------------------------------------------------------------------------- Lower Extremity Assessment Details Patient Name: Date of Service: [name] [name], [name] MIE L. 02/11/2020 9:30 A M Medical Record Number: [id number] Patient Account Number: [id number] Date of Birth/Sex: Treating RN: [birth year] (24 y.o. Clearnce Sorrel Primary Care Reyan Helle: Merita Norton ULT, PRO V IDER Other Clinician: Referring Avrie Kedzierski: Treating Shyne Resch/Extender: Cheree Ditto in Treatment: 5 Edema Assessment Assessed: [Left: No] [Right: No] Edema: [Left: Ye] [Right: s] Calf Left: Right: Point of Measurement: 39 cm From Medial Instep cm 34 cm Ankle Left: Right: Point of Measurement: 9 cm From Medial Instep cm 22 cm Vascular Assessment Pulses: Dorsalis Pedis Palpable: [Right:Yes] Electronic Signature(s) Signed: 02/12/2020 7:13:26 AM By: Kela Millin Entered By: Kela Millin on 02/11/2020 10:03:59 -------------------------------------------------------------------------------- Multi Wound Chart Details Patient Name: Date of Service: [name] Jeans MIE L. 02/11/2020 9:30 A M Medical Record Number: [id number] Patient Account Number: [id number] Date of Birth/Sex: Treating RN: [birth year] (24 y.o. Orvan Falconer Primary Care Dierks Wach: Merita Norton ULT, PRO V IDER Other Clinician: Referring Alyene Predmore: Treating Amelita Risinger/Extender: Cheree Ditto in Treatment: 5 Vital Signs Height(in): 63 Pulse(bpm): 108 Weight(lbs): 152 Blood Pressure(mmHg): 118/74 Body Mass Index(BMI): 27 Temperature(F): 98.4 Respiratory Rate(breaths/min): 18 Photos:  [1:No Photos Right, Lateral Lower Leg] [N/A:N/A N/A] Wound Location: [1:Gradually Appeared] [N/A:N/A] Wounding Event: [1:Inflammatory] [N/A:N/A] Primary Etiology: [1:Hypertension, Lupus Erythematosus,] [N/A:N/A] Comorbid History: [1:Raynauds 10/14/2019] [N/A:N/A] Date Acquired: [1:5] [N/A:N/A] Weeks of Treatment: [1:Open] [N/A:N/A] Wound Status: [1:1.4x1.2x0.1] [N/A:N/A] Measurements L x W x D (cm) [1:1.319] [N/A:N/A] A (cm) : rea [1:0.132] [N/A:N/A] Volume (cm) : [1:13.90%] [N/A:N/A] % Reduction in Area: [1:85.60%] [N/A:N/A] %  Reduction in Volume: [1:Full Thickness Without Exposed] [N/A:N/A] Classification: [1:Support Structures Medium] [N/A:N/A] Exudate Amount: [1:Serosanguineous] [N/A:N/A] Exudate Type: [1:red, brown] [N/A:N/A] Exudate Color: [1:Well defined, not attached] [N/A:N/A] Wound Margin: [1:Large (67-100%)] [N/A:N/A] Granulation Amount: [1:Red, Pink] [N/A:N/A] Granulation Quality: [1:Small (1-33%)] [N/A:N/A] Necrotic Amount: [1:Fat Layer (Subcutaneous Tissue)] [N/A:N/A] Exposed Structures: [1:Exposed: Yes Fascia: No Tendon: No Muscle: No Joint: No Bone: No None] [N/A:N/A] Treatment Notes Electronic Signature(s) Signed: 02/11/2020 5:52:05 PM By: Linton Ham MD Signed: 02/12/2020 4:36:43 PM By: Carlene Coria RN Entered By: Linton Ham on 02/11/2020 11:00:01 -------------------------------------------------------------------------------- Multi-Disciplinary Care Plan Details Patient Name: Date of Service: [name] Jeans MIE L. 02/11/2020 9:30 A M Medical Record Number: [id number] Patient Account Number: [id number] Date of Birth/Sex: Treating RN: 12/07/1995 (24 y.o. Orvan Falconer Primary Care Amaranta Mehl: Merita Norton ULT, PRO Ave Filter Other Clinician: Referring Harris Kistler: Treating Monicka Cyran/Extender: Cheree Ditto in Treatment: 5 Active Inactive Wound/Skin Impairment Nursing Diagnoses: Impaired tissue integrity Knowledge deficit related to ulceration/compromised skin  integrity Goals: Patient/caregiver will verbalize understanding of skin care regimen Date Initiated: 01/06/2020 Target Resolution Date: 03/09/2020 Goal Status: Active Ulcer/skin breakdown will have a volume reduction of 30% by week 4 Date Initiated: 01/06/2020 Date Inactivated: 02/11/2020 Target Resolution Date: 02/07/2020 Goal Status: Met Ulcer/skin breakdown will have a volume reduction of 50% by week 8 Date Initiated: 02/11/2020 Target Resolution Date: 03/09/2020 Goal Status: Active Interventions: Assess patient/caregiver ability to obtain necessary supplies Assess patient/caregiver ability to perform ulcer/skin care regimen upon admission and as needed Assess ulceration(s) every visit Provide education on ulcer and skin care Notes: Electronic Signature(s) Signed: 02/12/2020 4:36:43 PM By: Carlene Coria RN Entered By: Carlene Coria on 02/11/2020 10:45:10 -------------------------------------------------------------------------------- Pain Assessment Details Patient Name: Date of Service: [name] [name], [name] MIE L. 02/11/2020 9:30 A M Medical Record Number: [id number] Patient Account Number: [id number] Date of Birth/Sex: Treating RN: 02/17/1996 (24 y.o. Orvan Falconer Primary Care Aneli Zara: Merita Norton ULT, PRO Ave Filter Other Clinician: Referring Paarth Cropper: Treating Branda Chaudhary/Extender: Cheree Ditto in Treatment: 5 Active Problems Location of Pain Severity and Description of Pain Patient Has Paino Yes Site Locations Rate the pain. Rate the pain. Current Pain Level: 8 Pain Management and Medication Current Pain Management: Electronic Signature(s) Signed: 02/12/2020 4:36:43 PM By: Carlene Coria RN Signed: 02/13/2020 9:12:52 AM By: Sandre Kitty Entered By: Sandre Kitty on 02/11/2020 09:54:35 -------------------------------------------------------------------------------- Patient/Caregiver Education Details Patient Name: Date of Service: [name] [name] 6/1/2021andnbsp9:30 [place]  Record Number: [id number] Patient Account Number: [id number] Date of Birth/Gender: Treating RN: Aug 09, 1996 (24 y.o. Orvan Falconer Primary Care Physician: Merita Norton ULT, PRO V IDER Other Clinician: Referring Physician: Treating Physician/Extender: Cheree Ditto in Treatment: 5 Education Assessment Education Provided To: Patient Education Topics Provided Wound/Skin Impairment: Methods: Explain/Verbal Responses: State content correctly Electronic Signature(s) Signed: 02/12/2020 4:36:43 PM By: Carlene Coria RN Entered By: Carlene Coria on 02/11/2020 10:45:24 -------------------------------------------------------------------------------- Wound Assessment Details Patient Name: Date of Service: [name] Jeans MIE L. 02/11/2020 9:30 A M Medical Record Number: [id number] Patient Account Number: [id number] Date of Birth/Sex: Treating RN: 04/04/1996 (24 y.o. Orvan Falconer Primary Care Loman Logan: Merita Norton ULT, PRO Ave Filter Other Clinician: Referring Ifeoluwa Beller: Treating Llewyn Heap/Extender: Cheree Ditto in Treatment: 5 Wound Status Wound Number: 1 Primary Etiology: Inflammatory Wound Location: Right, Lateral Lower Leg Wound Status: Open Wounding Event: Gradually Appeared Comorbid History: Hypertension, Lupus Erythematosus, Raynauds Date Acquired: 10/14/2019 Weeks Of Treatment: 5 Clustered Wound: No Photos Photo Uploaded By: Mikeal Hawthorne on 02/12/2020 10:29:51 Wound Measurements Length: (cm) 1.4 Width: (cm) 1.2 Depth: (cm) 0.1  Area: (cm) 1.319 Volume: (cm) 0.132 % Reduction in Area: 13.9% % Reduction in Volume: 85.6% Epithelialization: None Tunneling: No Undermining: No Wound Description Classification: Full Thickness Without Exposed Support Structures Wound Margin: Well defined, not attached Exudate Amount: Medium Exudate Type: Serosanguineous Exudate Color: red, brown Foul Odor After Cleansing: No Slough/Fibrino Yes Wound Bed Granulation Amount: Large (67-100%) Exposed  Structure Granulation Quality: Red, Pink Fascia Exposed: No Necrotic Amount: Small (1-33%) Fat Layer (Subcutaneous Tissue) Exposed: Yes Necrotic Quality: Adherent Slough Tendon Exposed: No Muscle Exposed: No Joint Exposed: No Bone Exposed: No Treatment Notes Wound #1 (Right, Lateral Lower Leg) 1. Cleanse With Wound Cleanser 2. Periwound Care Skin Prep 3. Primary Dressing Applied Collegen AG 4. Secondary Dressing Foam Border Dressing Electronic Signature(s) Signed: 02/12/2020 7:13:26 AM By: Kela Millin Signed: 02/12/2020 4:36:43 PM By: Carlene Coria RN Entered By: Kela Millin on 02/11/2020 10:04:10 -------------------------------------------------------------------------------- Vitals Details Patient Name: Date of Service: [name] Jeans MIE L. 02/11/2020 9:30 A M Medical Record Number: [id number] Patient Account Number: [id number] Date of Birth/Sex: Treating RN: [birth year] (24 y.o. Orvan Falconer Primary Care Latrice Storlie: Merita Norton ULT, PRO V IDER Other Clinician: Referring Jayleon Mcfarlane: Treating Keeshia Sanderlin/Extender: Cheree Ditto in Treatment: 5 Vital Signs Time Taken: 09:53 Temperature (F): 98.4 Height (in): 63 Pulse (bpm): 108 Weight (lbs): 152 Respiratory Rate (breaths/min): 18 Body Mass Index (BMI): 26.9 Blood Pressure (mmHg): 118/74 Reference Range: 80 - 120 mg / dl Electronic Signature(s) Signed: 02/13/2020 9:12:52 AM By: Sandre Kitty Entered By: Sandre Kitty on 02/11/2020 09:54:09

## 2020-02-18 ENCOUNTER — Encounter: Payer: Self-pay | Admitting: Vascular Surgery

## 2020-02-18 ENCOUNTER — Ambulatory Visit: Payer: Medicaid Other | Admitting: Family Medicine

## 2020-02-18 ENCOUNTER — Ambulatory Visit (INDEPENDENT_AMBULATORY_CARE_PROVIDER_SITE_OTHER): Payer: Self-pay | Admitting: Vascular Surgery

## 2020-02-18 ENCOUNTER — Other Ambulatory Visit: Payer: Self-pay

## 2020-02-18 DIAGNOSIS — I739 Peripheral vascular disease, unspecified: Secondary | ICD-10-CM

## 2020-02-18 NOTE — Progress Notes (Signed)
Patient name: [name] [name] MRN: [id number] DOB: 01-11-1996 Sex: female  REASON FOR CONSULT: Evaluate wound of right lower extremity  HPI: [name] [name] is a 24 y.o. female, with history of hypertension, depression, lupus, previous right lower extremity DVT on anticoagulation that presents for evaluation of right shin wound.  Her mom states this was initially present about 4 to 6 months ago with no associated trauma.  She is apparently has been going to wound clinic.  Mom states the wound looks a lot better over the last month and was very deep but has become very shallow and is healing appropriately now.  She states when she walks long distances she has some pain in the toes but otherwise no pain in the calf or thigh.  No issues with the left foot.  Sounds like doctors have always had difficulty finding pulses in her right foot.  No previous lower extremity interventions.  Has had multiple hospitalizations for her lupus.  Past Medical History:  Diagnosis Date  . Accidental methotrexate overdose 01/08/2018  . Acute lower UTI   . Bronchitis 03/19/2019  . Cardiomegaly 05/08/2019  . Chronic midline low back pain without sciatica 11/09/2016   Facet joint arthropathy  . Dehydration 03/19/2019  . Depo-Provera contraceptive status 08/11/2015  . Depression 02/08/2017  . Dysphagia 06/01/2018  . Dysuria 04/19/2019  . Elevated AST (SGOT) 03/19/2019  . Essential hypertension 03/19/2019  . Facial edema   . Gout   . Hypertension   . IDA (iron deficiency anemia) 08/11/2015  . Immunosuppressed status ([place])   . Insomnia 01/29/2016  . Leukopenia 01/10/2018  . Lobar pneumonia ([place]) 06/07/2018  . Lupus ([place])   . Lupus (systemic lupus erythematosus) ([place]) 03/19/2019  . Metabolic acidosis 01/11/7615  . Methotrexate toxicity   . Mouth ulcers   . Mucositis oral 01/12/2018  . Odynophagia 06/01/2018  . Oral thrush 03/19/2019  . Pleuritic chest pain 06/05/2018  . Primary osteoarthritis of both knees 11/09/2016  . Rash due to  systemic lupus erythematosus (SLE) ([place])   . Raynaud disease   . Raynaud's disease 08/11/2015  . Relative acute adrenal insufficiency ([place]) 06/11/2018  . Right leg DVT ([place]) 05/08/2019  . Seasonal allergies 08/11/2015  . Sepsis ([place]) 05/08/2019  . Sinus tachycardia 03/19/2019  . SLE (systemic lupus erythematosus) ([place]) 09/19/2016     Positive ANA, double-stranded DNA, Ro, RNP, RF, history of nasal ulcers, fatigue, anemia, arthritis, Raynauds  . Thrush 06/01/2018  . Vaginal candidiasis 01/12/2018  . Vitamin D deficiency 11/09/2016    Past Surgical History:  Procedure Laterality Date  . ORIF SHOULDER FRACTURE Left   . TONSILLECTOMY      Family History  Problem Relation Age of Onset  . Aneurysm Mother   . Stroke Sister   . Seizures Sister   . Lung cancer Maternal Aunt   . Heart attack Paternal Uncle   . Colon cancer Neg Hx   . Esophageal cancer Neg Hx   . Pancreatic cancer Neg Hx   . Stomach cancer Neg Hx     SOCIAL HISTORY: Social History   Socioeconomic History  . Marital status: Single    Spouse name: Not on file  . Number of children: Not on file  . Years of education: Not on file  . Highest education level: Not on file  Occupational History  . Not on file  Tobacco Use  . Smoking status: Never Smoker  . Smokeless tobacco: Never Used  Substance and Sexual Activity  .  Alcohol use: No  . Drug use: No  . Sexual activity: Not Currently  Other Topics Concern  . Not on file  Social History Narrative  . Not on file   Social Determinants of Health   Financial Resource Strain:   . Difficulty of Paying Living Expenses:   Food Insecurity:   . Worried About Charity fundraiser in the Last Year:   . Arboriculturist in the Last Year:   Transportation Needs:   . Film/video editor (Medical):   Marland Kitchen Lack of Transportation (Non-Medical):   Physical Activity:   . Days of Exercise per Week:   . Minutes of Exercise per Session:   Stress:   . Feeling of Stress :   Social  Connections:   . Frequency of Communication with Friends and Family:   . Frequency of Social Gatherings with Friends and Family:   . Attends Religious Services:   . Active Member of Clubs or Organizations:   . Attends Archivist Meetings:   Marland Kitchen Marital Status:   Intimate Partner Violence:   . Fear of Current or Ex-Partner:   . Emotionally Abused:   Marland Kitchen Physically Abused:   . Sexually Abused:     Allergies  Allergen Reactions  . Hydroxychloroquine Rash  . Latex Rash    Current Outpatient Medications  Medication Sig Dispense Refill  . amLODipine (NORVASC) 10 MG tablet Take 1 tablet (10 mg total) by mouth daily. 30 tablet 0  . buPROPion (WELLBUTRIN XL) 300 MG 24 hr tablet TAKE 1 TABLET BY MOUTH EVERY DAY 90 tablet 1  . ferrous sulfate 325 (65 FE) MG tablet Take 1 tablet (325 mg total) by mouth 2 (two) times daily. With 1/2 glass of orange juice 30 tablet 0  . folic acid (FOLVITE) 1 MG tablet Take 1 tablet (1 mg total) by mouth at bedtime. 30 tablet 0  . gabapentin (NEURONTIN) 300 MG capsule TAKE 1 CAPSULE BY MOUTH THREE TIMES A DAY 90 capsule 3  . lisinopril (ZESTRIL) 20 MG tablet Take 20 mg by mouth daily.    Marland Kitchen loperamide (IMODIUM) 2 MG capsule Take 1 capsule (2 mg total) by mouth as needed for diarrhea or loose stools. 10 capsule 0  . metoprolol tartrate (LOPRESSOR) 25 MG tablet Take 1 tablet (25 mg total) by mouth 2 (two) times daily. 30 tablet 0  . Multiple Vitamins-Minerals (MULTIVITAMIN ADULT PO) Take 1 tablet by mouth daily.    . mycophenolate (CELLCEPT) 250 MG capsule Take 500 mg by mouth 2 (two) times daily.    Marland Kitchen omeprazole (PRILOSEC) 20 MG capsule Take 1 capsule (20 mg total) by mouth daily. 30 capsule 3  . rivaroxaban (XARELTO) 10 MG TABS tablet Take 1 tablet (10 mg total) by mouth daily with supper. 30 tablet 6  . traZODone (DESYREL) 100 MG tablet Take 1 tablet (100 mg total) by mouth at bedtime. 30 tablet 3  . Vitamin D, Ergocalciferol, (DRISDOL) 50000 units CAPS  capsule Take 1 capsule (50,000 Units total) by mouth 2 (two) times a week. 24 capsule 0  . acetaminophen-codeine (TYLENOL #3) 300-30 MG tablet Take 1 tablet by mouth every 6 (six) hours as needed for moderate pain. (Patient not taking: Reported on 02/18/2020) 15 tablet 0  . tacrolimus (PROGRAF) 1 MG capsule Take 1 mg by mouth 2 (two) times daily.     No current facility-administered medications for this visit.    REVIEW OF SYSTEMS:  [X]  denotes positive finding, [ ]   denotes negative finding Cardiac  Comments:  Chest pain or chest pressure:    Shortness of breath upon exertion:    Short of breath when lying flat:    Irregular heart rhythm:        Vascular    Pain in calf, thigh, or hip brought on by ambulation:    Pain in feet at night that wakes you up from your sleep:     Blood clot in your veins:    Leg swelling:         Pulmonary    Oxygen at home:    Productive cough:     Wheezing:         Neurologic    Sudden weakness in arms or legs:     Sudden numbness in arms or legs:     Sudden onset of difficulty speaking or slurred speech:    Temporary loss of vision in one eye:     Problems with dizziness:         Gastrointestinal    Blood in stool:     Vomited blood:         Genitourinary    Burning when urinating:     Blood in urine:        Psychiatric    Major depression:         Hematologic    Bleeding problems:    Problems with blood clotting too easily:        Skin    Rashes or ulcers:        Constitutional    Fever or chills:      PHYSICAL EXAM: Vitals:   02/18/20 1045  BP: (!) 130/94  Pulse: (!) 108  Resp: 16  SpO2: 92%  Weight: 147 lb (66.7 kg)  Height: 5\' 3"  (1.6 m)    GENERAL: The patient is a well-nourished female, in no acute distress. The vital signs are documented above. CARDIAC: There is a regular rate and rhythm.  VASCULAR:  Palpable femoral pulses bilaterally No palpable right pedal pulses Right shin wound pictured below PULMONARY:  There is good air exchange bilaterally without wheezing or rales. ABDOMEN: Soft and non-tender with normal pitched bowel sounds.  MUSCULOSKELETAL: There are no major deformities or cyanosis. NEUROLOGIC: No focal weakness or paresthesias are detected. SKIN: There are no ulcers or rashes noted. PSYCHIATRIC: The patient has a normal affect.      DATA:   ABIs on the right are 0.92 biphasic and on the left are 1.12 triphasic  Assessment/Plan:  24 year old female with history of lupus as well as right lower extremity DVT that presents for evaluation of her right shin wound.  Discussed with both the patient and her mother that her toe pressure of 52 on the right is marginal for wound healing but it sounds like the wound has made significant progress over the last month according to both the patient and her mother.  Discussed that assuming that this wound continues to heal we can probably delay arteriogram for now.  I discussed follow-up in one month to closely monitor wound wound and discussed that certainly if this gets worse we will plan for arteriogram and possible intervention in her right leg.  The wound is not in the typical distribution for arterial disease and this may be related to her lupus.    Marty Heck, MD [facility] Office: [phone]

## 2020-02-22 DIAGNOSIS — R0609 Other forms of dyspnea: Secondary | ICD-10-CM | POA: Insufficient documentation

## 2020-02-24 ENCOUNTER — Encounter (HOSPITAL_BASED_OUTPATIENT_CLINIC_OR_DEPARTMENT_OTHER): Payer: Medicaid Other | Admitting: Physician Assistant

## 2020-02-24 ENCOUNTER — Other Ambulatory Visit: Payer: Self-pay

## 2020-02-24 DIAGNOSIS — L97212 Non-pressure chronic ulcer of right calf with fat layer exposed: Secondary | ICD-10-CM | POA: Diagnosis not present

## 2020-02-24 NOTE — Progress Notes (Signed)
[place], [place] ([id number]) Visit Report for 02/24/2020 Arrival Information Details Patient Name: Date of Service: [name] [name], [name] [name] 02/24/2020 12:45 PM Medical Record Number: [id number] Patient Account Number: [id number] Date of Birth/Sex: Treating RN: August 06, 1996 (24 y.o. [name] [name] [name] Primary Care [name] [name] [name]: [name] [name] [name] Other Clinician: Referring [name] [name] [name]: Treating [name] [name] [name]/Extender: [name] [name] [name] in Treatment: 7 Visit Information History Since Last Visit Added or deleted any medications: No Patient Arrived: Ambulatory Any new allergies or adverse reactions: No Arrival Time: 12:56 Had a fall or experienced change in No Accompanied By: mother activities of daily living that may affect Transfer Assistance: None risk of falls: Patient Identification Verified: Yes Signs or symptoms of abuse/neglect since last visito No Secondary Verification Process Completed: Yes Hospitalized since last visit: No Patient Requires Transmission-Based Precautions: No Implantable device outside of the clinic excluding No Patient Has Alerts: No cellular tissue based products placed in the center since last visit: Has Dressing in Place as Prescribed: Yes Pain Present Now: No Electronic Signature(s) Signed: 02/24/2020 5:38:02 PM By: [name] Gouty RN, BSN Entered By: [name] [name] [name] on 02/24/2020 12:56:39 -------------------------------------------------------------------------------- Clinic Level of Care Assessment Details Patient Name: Date of Service: [name], [name] MIE L. 02/24/2020 12:45 PM Medical Record Number: [id number] Patient Account Number: [id number] Date of Birth/Sex: Treating RN: 05-Sep-1996 (24 y.o. [name] [name] [name] Primary Care [name] [name] [name]: [name] [name] [name] V [name] [name] Other Clinician: Referring [name] [name] [name]: Treating [name] [name] [name]/Extender: [name] [name] [name] in Treatment: 7 Clinic Level of Care Assessment Items TOOL 4 Quantity Score '[]'  - 0 Use when only an EandM is performed on  FOLLOW-UP visit ASSESSMENTS - Nursing Assessment / Reassessment X- 1 10 Reassessment of Co-morbidities (includes updates in patient status) X- 1 5 Reassessment of Adherence to Treatment Plan ASSESSMENTS - Wound and Skin A ssessment / Reassessment X - Simple Wound Assessment / Reassessment - one wound 1 5 '[]'  - 0 Complex Wound Assessment / Reassessment - multiple wounds '[]'  - 0 Dermatologic / Skin Assessment (not related to wound area) ASSESSMENTS - Focused Assessment '[]'  - 0 Circumferential Edema Measurements - multi extremities '[]'  - 0 Nutritional Assessment / Counseling / Intervention X- 1 5 Lower Extremity Assessment (monofilament, tuning fork, pulses) '[]'  - 0 Peripheral Arterial Disease Assessment (using hand held doppler) ASSESSMENTS - Ostomy and/or Continence Assessment and Care '[]'  - 0 Incontinence Assessment and Management '[]'  - 0 Ostomy Care Assessment and Management (repouching, etc.) PROCESS - Coordination of Care X - Simple Patient / Family Education for ongoing care 1 15 '[]'  - 0 Complex (extensive) Patient / Family Education for ongoing care X- 1 10 Staff obtains Programmer, systems, Records, T Results / Process Orders est '[]'  - 0 Staff telephones HHA, Nursing Homes / Clarify orders / etc '[]'  - 0 Routine Transfer to another Facility (non-emergent condition) '[]'  - 0 Routine Hospital Admission (non-emergent condition) '[]'  - 0 New Admissions / Biomedical engineer / Ordering NPWT Apligraf, etc. , '[]'  - 0 Emergency Hospital Admission (emergent condition) X- 1 10 Simple Discharge Coordination '[]'  - 0 Complex (extensive) Discharge Coordination PROCESS - Special Needs '[]'  - 0 Pediatric / Minor Patient Management '[]'  - 0 Isolation Patient Management '[]'  - 0 Hearing / Language / Visual special needs '[]'  - 0 Assessment of Community assistance (transportation, D/C planning, etc.) '[]'  - 0 Additional assistance / Altered mentation '[]'  - 0 Support Surface(s) Assessment (bed, cushion,  seat, etc.) INTERVENTIONS - Wound Cleansing / Measurement X - Simple Wound Cleansing - one wound 1 5 '[]'  - 0 Complex Wound  Cleansing - multiple wounds X- 1 5 Wound Imaging (photographs - any number of wounds) '[]'  - 0 Wound Tracing (instead of photographs) X- 1 5 Simple Wound Measurement - one wound '[]'  - 0 Complex Wound Measurement - multiple wounds INTERVENTIONS - Wound Dressings X - Small Wound Dressing one or multiple wounds 1 10 '[]'  - 0 Medium Wound Dressing one or multiple wounds '[]'  - 0 Large Wound Dressing one or multiple wounds X- 1 5 Application of Medications - topical '[]'  - 0 Application of Medications - injection INTERVENTIONS - Miscellaneous '[]'  - 0 External ear exam '[]'  - 0 Specimen Collection (cultures, biopsies, blood, body fluids, etc.) '[]'  - 0 Specimen(s) / Culture(s) sent or taken to Lab for analysis '[]'  - 0 Patient Transfer (multiple staff / Civil Service fast streamer / Similar devices) '[]'  - 0 Simple Staple / Suture removal (25 or less) '[]'  - 0 Complex Staple / Suture removal (26 or more) '[]'  - 0 Hypo / Hyperglycemic Management (close monitor of Blood Glucose) '[]'  - 0 Ankle / Brachial Index (ABI) - do not check if billed separately X- 1 5 Vital Signs Has the patient been seen at the hospital within the last three years: Yes Total Score: 95 Level Of Care: New/Established - Level 3 Electronic Signature(s) Signed: 02/24/2020 5:38:02 PM By: [name] Gouty RN, BSN Entered By: [name] [name] [name] on 02/24/2020 13:21:08 -------------------------------------------------------------------------------- Encounter Discharge Information Details Patient Name: Date of Service: [name] [name] [name] MIE L. 02/24/2020 12:45 PM Medical Record Number: [id number] Patient Account Number: [id number] Date of Birth/Sex: Treating RN: Jan 27, 1996 (24 y.o. [name] [name] [name] Primary Care Margues Filippini: Merita Norton ULT, [name] Other Clinician: Referring Kinza Gouveia: Treating Riann Oman/Extender: [name] [name] [name] in  Treatment: 7 Encounter Discharge Information Items Discharge Condition: Stable Ambulatory Status: Ambulatory Discharge Destination: Home Transportation: Private Auto Accompanied By: mother Schedule Follow-up Appointment: Yes Clinical Summary of Care: Patient Declined Electronic Signature(s) Signed: 02/24/2020 5:38:02 PM By: [name] Gouty RN, BSN Entered By: [name] [name] [name] on 02/24/2020 13:26:23 -------------------------------------------------------------------------------- Lower Extremity Assessment Details Patient Name: Date of Service: [name] [name] [name] MIE L. 02/24/2020 12:45 PM Medical Record Number: [id number] Patient Account Number: [id number] Date of Birth/Sex: Treating RN: Sep 16, 1995 (24 y.o. [name] [name] [name] Primary Care Cassaundra Rasch: Merita Norton ULT, [name] Other Clinician: Referring Avyonna Wagoner: Treating Lynell Kussman/Extender: [name] [name] [name] in Treatment: 7 Edema Assessment Assessed: [Left: No] [Right: No] Edema: [Left: N] [Right: o] Calf Left: Right: Point of Measurement: 39 cm From Medial Instep cm 35 cm Ankle Left: Right: Point of Measurement: 9 cm From Medial Instep cm 21.5 cm Electronic Signature(s) Signed: 02/24/2020 5:38:02 PM By: [name] Gouty RN, BSN Entered By: [name] [name] [name] on 02/24/2020 13:00:57 -------------------------------------------------------------------------------- Multi-Disciplinary Care Plan Details Patient Name: Date of Service: [name] [name] [name] MIE L. 02/24/2020 12:45 PM Medical Record Number: [id number] Patient Account Number: [id number] Date of Birth/Sex: Treating RN: 03/03/1996 (24 y.o. [name] [name] [name] Primary Care Jamiria Langill: Merita Norton [facility]LT, [name] Other Clinician: Referring Adren Dollins: Treating Linetta Regner/Extender: [name] [name] [name] in Treatment: 7 Active Inactive Wound/Skin Impairment Nursing Diagnoses: Impaired tissue integrity Knowledge deficit related to ulceration/compromised skin integrity Goals: Patient/caregiver will verbalize  understanding of skin care regimen Date Initiated: 01/06/2020 Target Resolution Date: 03/09/2020 Goal Status: Active Ulcer/skin breakdown will have a volume reduction of 30% by week 4 Date Initiated: 01/06/2020 Date Inactivated: 02/11/2020 Target Resolution Date: 02/07/2020 Goal Status: Met Ulcer/skin breakdown will have a volume reduction of 50% by week 8 Date Initiated: 02/11/2020 Target Resolution Date: 03/09/2020 Goal Status: Active Interventions: Assess patient/caregiver ability to obtain  necessary supplies Assess patient/caregiver ability to perform ulcer/skin care regimen upon admission and as needed Assess ulceration(s) every visit Provide education on ulcer and skin care Notes: Electronic Signature(s) Signed: 02/24/2020 5:38:02 PM By: [name] Gouty RN, BSN Entered By: [name] [name] [name] on 02/24/2020 13:19:55 -------------------------------------------------------------------------------- Pain Assessment Details Patient Name: Date of Service: [name] [name] [name] MIE L. 02/24/2020 12:45 PM Medical Record Number: [id number] Patient Account Number: [id number] Date of Birth/Sex: Treating RN: 04/14/1996 (24 y.o. [name] [name] [name] Primary Care Nazanin Kinner: Merita Norton [facility]LT, [name] Other Clinician: Referring Sagal Gayton: Treating Vanden Fawaz/Extender: [name] [name] [name] in Treatment: 7 Active Problems Location of Pain Severity and Description of Pain Patient Has Paino No Site Locations Rate the pain. Current Pain Level: 0 Pain Management and Medication Current Pain Management: Electronic Signature(s) Signed: 02/24/2020 5:38:02 PM By: [name] Gouty RN, BSN Entered By: [name] [name] [name] on 02/24/2020 12:57:18 -------------------------------------------------------------------------------- Patient/Caregiver Education Details Patient Name: Date of Service: [name] [name] [name] 6/14/2021andnbsp12:45 PM Medical Record Number: [id number] Patient Account Number: [id number] Date of Birth/Gender: Treating  RN: 04-09-1996 (24 y.o. [name] [name] [name] Primary Care Physician: Merita Norton ULT, [name] Other Clinician: Referring Physician: Treating Physician/Extender: [name] [name] [name] in Treatment: 7 Education Assessment Education Provided To: Patient Education Topics Provided Wound/Skin Impairment: Methods: Explain/Verbal Responses: Reinforcements needed, State content correctly Electronic Signature(s) Signed: 02/24/2020 5:38:02 PM By: [name] Gouty RN, BSN Entered By: [name] [name] [name] on 02/24/2020 13:20:11 -------------------------------------------------------------------------------- Wound Assessment Details Patient Name: Date of Service: [name] [name] [name] MIE L. 02/24/2020 12:45 PM Medical Record Number: [id number] Patient Account Number: [id number] Date of Birth/Sex: Treating RN: [birth year] (24 y.o. [name] [name] [name] Primary Care Arben Packman: [name] [name] [name] V [name] [name] Other Clinician: Referring Tamecia Mcdougald: Treating Smriti Barkow/Extender: [name] [name] [name] in Treatment: 7 Wound Status Wound Number: 1 Primary Etiology: Inflammatory Wound Location: Right, Lateral Lower Leg Wound Status: Open Wounding Event: Gradually Appeared Comorbid History: Hypertension, Lupus Erythematosus, Raynauds Date Acquired: 10/14/2019 Weeks Of Treatment: 7 Clustered Wound: No Wound Measurements Length: (cm) 0.3 Width: (cm) 0.4 Depth: (cm) 0.1 Area: (cm) 0.094 Volume: (cm) 0.009 % Reduction in Area: 93.9% % Reduction in Volume: 99% Epithelialization: Medium (34-66%) Tunneling: No Undermining: No Wound Description Classification: Full Thickness Without Exposed Support Structures Wound Margin: Well defined, not attached Exudate Amount: None Present Foul Odor After Cleansing: No Slough/Fibrino No Wound Bed Granulation Amount: Large (67-100%) Exposed Structure Granulation Quality: Red Fascia Exposed: No Necrotic Amount: None Present (0%) Fat Layer (Subcutaneous Tissue) Exposed: Yes Tendon Exposed:  No Muscle Exposed: No Joint Exposed: No Bone Exposed: No Treatment Notes Wound #1 (Right, Lateral Lower Leg) 3. Primary Dressing Applied Collegen AG Hydrogel or K-Y Jelly 4. Secondary Dressing Foam Border Dressing Electronic Signature(s) Signed: 02/24/2020 5:38:02 PM By: [name] Gouty RN, BSN Entered By: [name] [name] [name] on 02/24/2020 13:02:06 -------------------------------------------------------------------------------- [place] Details Patient Name: Date of Service: [name] [name] [name] MIE L. 02/24/2020 12:45 PM Medical Record Number: [id number] Patient Account Number: [id number] Date of Birth/Sex: Treating RN: Jun 21, 1996 (24 y.o. [name] [name] [name] Primary Care Mertha Clyatt: [name] [name] [name] V [name] [name] Other Clinician: Referring Pearle Wandler: Treating Breslyn Abdo/Extender: [name] [name] [name] in Treatment: 7 Vital Signs Time Taken: 12:56 Temperature (F): 97.7 Height (in): 63 Pulse (bpm): 96 Source: Stated Respiratory Rate (breaths/min): 18 Weight (lbs): 152 Blood Pressure (mmHg): 142/96 Source: Stated Reference Range: 80 - 120 mg / dl Body Mass Index (BMI): 26.9 Electronic Signature(s) Signed: 02/24/2020 5:38:02 PM By: [name] Gouty RN, BSN Signed: 02/24/2020 5:38:02 PM By: [name] Gouty RN, BSN Entered By: [name] [name] [name] on 02/24/2020 12:57:08

## 2020-02-24 NOTE — Progress Notes (Addendum)
[name] [name], [name] [name] ([id number]) Visit Report for 02/24/2020 Chief Complaint Document Details Patient Name: Date of Service: [name] [name] 02/24/2020 12:45 PM Medical Record Number: [id number] Patient Account Number: [id number] Date of Birth/Sex: Treating RN: 02/17/1996 (24 y.o. [name] [name] Primary Care Provider: Merita Norton ULT, PRO Ave [name] Other Clinician: Referring Provider: Treating Provider/Extender: [name] [name] in Treatment: 7 Information Obtained from: Patient Chief Complaint 01/06/2020; patient comes to clinic today with wounds on the right lateral calf x2 Electronic Signature(s) Signed: 02/24/2020 1:05:01 PM By: [name] Keeler PA-C Entered By: [name] [name] on 02/24/2020 13:05:00 -------------------------------------------------------------------------------- HPI Details Patient Name: Date of Service: [name] Jeans MIE L. 02/24/2020 12:45 PM Medical Record Number: [id number] Patient Account Number: [id number] Date of Birth/Sex: Treating RN: 01/01/1996 (24 y.o. [name] [name] Primary Care Provider: Merita Norton ULT, PRO Ave [name] Other Clinician: Referring Provider: Treating Provider/Extender: [name] [name] in Treatment: 7 History of Present Illness HPI Description: ADMISSION 01/06/2020 This is a complex 24 year old woman who arrives in clinic today for review of wounds on the right lateral leg. She was referred by her rheumatologist [name] [name] at [facility]. The patient has a history of systemic lupus at age 57 with positive ANA positive double-stranded DNA positive SSA. She also has a history of Raynaud's phenomenon, lupus nephritis stage III previously biopsied. She also has a history of an extensive DVT and apparently pulmonary embolism in September 2020 and she has remained on Xarelto since then. Finally she has a history anticardiolipin antibody positive and positive lupus anticoagulant She tells me that she noted 2 skin lesions on the right lateral calf the larger one  proximally and the smaller one medially sometime in January. She apparently was seen in the ER at the time felt to possibly have a current DVT in the leg. Apparently that was worked up and was negative. She was admitted to hospital from 3/26 through 3/29 and was felt to have a flare of her lupus. She was given Augmentin apparently for possible cellulitis in the right lower extremity. She was back in the ER on 3/29 the day she was discharged with pain in the leg. The patient states that the areas currently seen opened in February. She would been putting alcohol on it I think [name] [name] recently gave her mupirocin. Past medical history; the patient is a non-smoker. She has never been pregnant. As noted she has a significant history of venous thromboembolic disease although her recent DVT rule out was negative. She has hypertension, lupus nephritis Raynaud's syndrome and depression. She has Medicaid pending which we will have to keep in mind when we prescribe either drugs or wound care products. There currently paying for a lot of this out-of-pocket with help from family members ABI in our clinic was 0.89 on the right 01/13/2020 the patient has 2 areas on the right lateral calf with most significant one is proximal and a very tiny one down towards the ankle. 5/10; ABI on the right at 0.92 however TBI at 0.37 and only monophasic and biphasic waveforms. On the left waveforms were triphasic ABI at 1.12 TBI of 0.54. Although the suggest mild lower extremity arterial disease this woman is only 24 years old I would like her seen by vascular surgery. She has lupus and has had a history of DVT I remember correctly she has anticardiolipin antibody and positive lupus anticoagulant. If we can prove she has arterial disease I will . talk to her rheumatologist about whether she needs to  see hematology. I have not given up on the idea of biopsying this although she is on Xarelto. 5/17; she missed her vascular  appointment and now has one on 6/11. This is unfortunate. I am looking for some comment about her arterial supply to the foot and whether she has arterial insufficiency at age 34. Her wound actually has come in and there is less depth. She still has undermining areas at roughly 4-6 o'clock and at 12:00. 6/1; vascular appointment on 6/11. She has been using silver collagen and border foam her wound looks as though it is doing quite nicely. Everything is closed down there is no undermining. She still has roughly "6 I isle" claudication when going up and down the [place] at the grocery store 02/24/2020 upon evaluation today patient appears to be doing well with regard to her wound. This is actually measuring a lot smaller today and in fact appears to be doing excellent. I am very pleased with this. Fortunately there is no signs of active infection at this time. She did see vascular on 02/18/2020 this was [name] [name]. At this point he discussed with her that she does have a right lower extremity DVT he felt that the right toe pressure is marginal for wound healing but since the wound has made significant progress over the past month he felt like that it was appropriate to just allow this to continue to heal and delay the arteriogram for the time being. However he will be seeing her back for follow-up in 1 month time and if anything gets worse he will plan for the arteriogram at that point. As well as potential intervention in regard to the blood flow in the leg. He also felt the wound is not typical in distribution for an arterial disease ulcer and may be related to her lupus. Electronic Signature(s) Signed: 02/24/2020 1:23:50 PM By: [name] Keeler PA-C Entered By: [name] [name] on 02/24/2020 13:23:49 -------------------------------------------------------------------------------- Physical Exam Details Patient Name: Date of Service: [name], [name] 02/24/2020 12:45 PM Medical Record Number:  [id number] Patient Account Number: [id number] Date of Birth/Sex: Treating RN: 09-19-1995 (24 y.o. [name] [name] Primary Care Provider: Merita Norton ULT, PRO Ave [name] Other Clinician: Referring Provider: Treating Provider/Extender: [name] [name] in Treatment: 7 Constitutional Well-nourished and well-hydrated in no acute distress. Respiratory normal breathing without difficulty. Psychiatric this patient is able to make decisions and demonstrates good insight into disease process. Alert and Oriented x 3. pleasant and cooperative. Notes Patient's wound bed actually showed signs of excellent epithelization and granulation it does not appear to be any evidence of active infection which is great news. No fevers, chills, nausea, vomiting, or diarrhea. Electronic Signature(s) Signed: 02/24/2020 1:24:04 PM By: [name] Keeler PA-C Entered By: [name] [name] on 02/24/2020 13:24:03 -------------------------------------------------------------------------------- Physician Orders Details Patient Name: Date of Service: [name] Jeans MIE L. 02/24/2020 12:45 PM Medical Record Number: [id number] Patient Account Number: [id number] Date of Birth/Sex: Treating RN: May 05, 1996 (24 y.o. [name] [name] Primary Care Provider: DEFA ULT, PRO V IDER Other Clinician: Referring Provider: Treating Provider/Extender: [name] [name] in Treatment: 7 Verbal / Phone Orders: No Diagnosis Coding ICD-10 Coding Code Description [phone] Non-pressure chronic ulcer of right calf with fat layer exposed L97.211 Non-pressure chronic ulcer of right calf limited to breakdown of skin M32.19 Other organ or system involvement in systemic lupus erythematosus I73.00 Raynaud's syndrome without gangrene Follow-up Appointments Return Appointment in 2 weeks. Dressing Change Frequency Wound #1 Right,Lateral Lower Leg  Change Dressing every other day. Wound Cleansing Wound #1 Right,Lateral Lower Leg May shower and wash wound  with soap and water. - on days that dressing is changed Primary Wound Dressing Wound #1 Right,Lateral Lower Leg Silver Collagen - moisten with hydrogel or KY jelly Secondary Dressing Wound #1 Right,Lateral Lower Leg Foam Border - or bandaid Electronic Signature(s) Signed: 02/24/2020 5:38:02 PM By: Baruch Gouty RN, BSN Signed: 02/24/2020 5:53:30 PM By: [name] Keeler PA-C Entered By: Baruch Gouty on 02/24/2020 13:22:14 -------------------------------------------------------------------------------- Problem List Details Patient Name: Date of Service: [name] Jeans MIE L. 02/24/2020 12:45 PM Medical Record Number: [id number] Patient Account Number: [id number] Date of Birth/Sex: Treating RN: 17-Jul-1996 (24 y.o. [name] [name] Primary Care Provider: Merita Norton ULT, PRO Ave [name] Other Clinician: Referring Provider: Treating Provider/Extender: [name] [name] in Treatment: 7 Active Problems ICD-10 Encounter Code Description Active Date MDM Diagnosis L97.212 Non-pressure chronic ulcer of right calf with fat layer exposed 01/06/2020 No Yes L97.211 Non-pressure chronic ulcer of right calf limited to breakdown of skin 01/06/2020 No Yes M32.19 Other organ or system involvement in systemic lupus erythematosus 01/06/2020 No Yes I73.00 Raynaud's syndrome without gangrene 01/06/2020 No Yes Inactive Problems Resolved Problems Electronic Signature(s) Signed: 02/24/2020 1:04:48 PM By: [name] Keeler PA-C Signed: 02/24/2020 1:04:48 PM By: [name] Keeler PA-C Entered By: [name] [name] on 02/24/2020 13:04:48 -------------------------------------------------------------------------------- Progress Note Details Patient Name: Date of Service: [name] Jeans MIE L. 02/24/2020 12:45 PM Medical Record Number: [id number] Patient Account Number: [id number] Date of Birth/Sex: Treating RN: 1996/08/20 (24 y.o. [name] [name] Primary Care Provider: Merita Norton ULT, PRO Ave [name] Other Clinician: Referring  Provider: Treating Provider/Extender: [name] [name] in Treatment: 7 Subjective Chief Complaint Information obtained from Patient 01/06/2020; patient comes to clinic today with wounds on the right lateral calf x2 History of Present Illness (HPI) ADMISSION 01/06/2020 This is a complex 24 year old woman who arrives in clinic today for review of wounds on the right lateral leg. She was referred by her rheumatologist [name] [name] at [facility]. The patient has a history of systemic lupus at age 31 with positive ANA positive double-stranded DNA positive SSA. She also has a history of Raynaud's phenomenon, lupus nephritis stage III previously biopsied. She also has a history of an extensive DVT and apparently pulmonary embolism in September 2020 and she has remained on Xarelto since then. Finally she has a history anticardiolipin antibody positive and positive lupus anticoagulant She tells me that she noted 2 skin lesions on the right lateral calf the larger one proximally and the smaller one medially sometime in January. She apparently was seen in the ER at the time felt to possibly have a current DVT in the leg. Apparently that was worked up and was negative. She was admitted to hospital from 3/26 through 3/29 and was felt to have a flare of her lupus. She was given Augmentin apparently for possible cellulitis in the right lower extremity. She was back in the ER on 3/29 the day she was discharged with pain in the leg. The patient states that the areas currently seen opened in February. She would been putting alcohol on it I think [name] [name] recently gave her mupirocin. Past medical history; the patient is a non-smoker. She has never been pregnant. As noted she has a significant history of venous thromboembolic disease although her recent DVT rule out was negative. She has hypertension, lupus nephritis Raynaud's syndrome and depression. She has Medicaid pending which we will have to keep in mind  when  we prescribe either drugs or wound care products. There currently paying for a lot of this out-of-pocket with help from family members ABI in our clinic was 0.89 on the right 01/13/2020 the patient has 2 areas on the right lateral calf with most significant one is proximal and a very tiny one down towards the ankle. 5/10; ABI on the right at 0.92 however TBI at 0.37 and only monophasic and biphasic waveforms. On the left waveforms were triphasic ABI at 1.12 TBI of 0.54. Although the suggest mild lower extremity arterial disease this woman is only 24 years old I would like her seen by vascular surgery. She has lupus and has had a history of DVT I remember correctly she has anticardiolipin antibody and positive lupus anticoagulant. If we can prove she has arterial disease I will . talk to her rheumatologist about whether she needs to see hematology. I have not given up on the idea of biopsying this although she is on Xarelto. 5/17; she missed her vascular appointment and now has one on 6/11. This is unfortunate. I am looking for some comment about her arterial supply to the foot and whether she has arterial insufficiency at age 71. Her wound actually has come in and there is less depth. She still has undermining areas at roughly 4-6 o'clock and at 12:00. 6/1; vascular appointment on 6/11. She has been using silver collagen and border foam her wound looks as though it is doing quite nicely. Everything is closed down there is no undermining. She still has roughly "6 I isle" claudication when going up and down the [place] at the grocery store 02/24/2020 upon evaluation today patient appears to be doing well with regard to her wound. This is actually measuring a lot smaller today and in fact appears to be doing excellent. I am very pleased with this. Fortunately there is no signs of active infection at this time. She did see vascular on 02/18/2020 this was [name] [name]. At this point he discussed with her  that she does have a right lower extremity DVT he felt that the right toe pressure is marginal for wound healing but since the wound has made significant progress over the past month he felt like that it was appropriate to just allow this to continue to heal and delay the arteriogram for the time being. However he will be seeing her back for follow-up in 1 month time and if anything gets worse he will plan for the arteriogram at that point. As well as potential intervention in regard to the blood flow in the leg. He also felt the wound is not typical in distribution for an arterial disease ulcer and may be related to her lupus. Objective Constitutional Well-nourished and well-hydrated in no acute distress. Vitals Time Taken: 12:56 PM, Height: 63 in, Source: Stated, Weight: 152 lbs, Source: Stated, BMI: 26.9, Temperature: 97.7 F, Pulse: 96 bpm, Respiratory Rate: 18 breaths/min, Blood Pressure: 142/96 mmHg. Respiratory normal breathing without difficulty. Psychiatric this patient is able to make decisions and demonstrates good insight into disease process. Alert and Oriented x 3. pleasant and cooperative. General Notes: Patient's wound bed actually showed signs of excellent epithelization and granulation it does not appear to be any evidence of active infection which is great news. No fevers, chills, nausea, vomiting, or diarrhea. Integumentary (Hair, Skin) Wound #1 status is Open. Original cause of wound was Gradually Appeared. The wound is located on the Right,Lateral Lower Leg. The wound measures 0.3cm length x  0.4cm width x 0.1cm depth; 0.094cm^2 area and 0.009cm^3 volume. There is Fat Layer (Subcutaneous Tissue) Exposed exposed. There is no tunneling or undermining noted. There is a none present amount of drainage noted. The wound margin is well defined and not attached to the wound base. There is large (67-100%) red granulation within the wound bed. There is no necrotic tissue within the  wound bed. Assessment Active Problems ICD-10 Non-pressure chronic ulcer of right calf with fat layer exposed Non-pressure chronic ulcer of right calf limited to breakdown of skin Other organ or system involvement in systemic lupus erythematosus Raynaud's syndrome without gangrene Plan Follow-up Appointments: Return Appointment in 2 weeks. Dressing Change Frequency: Wound #1 Right,Lateral Lower Leg: Change Dressing every other day. Wound Cleansing: Wound #1 Right,Lateral Lower Leg: May shower and wash wound with soap and water. - on days that dressing is changed Primary Wound Dressing: Wound #1 Right,Lateral Lower Leg: Silver Collagen - moisten with hydrogel or KY jelly Secondary Dressing: Wound #1 Right,Lateral Lower Leg: Foam Border - or bandaid 1. I would recommend currently that we continue with the collagen I did have a discussion with the patient and mother as well today about making sure to keep her dressing in place as far as the collagen is concerned she has been leaving it open to air during the day and only wearing the dressing at night. Is can be best to actually keep this covered at all times. 2. I am also can recommend that she continue to use a border foam dressing to cover. 3. We will continue to monitor for anything worsening and if so get her back to [name] [name] at vascular for an arteriogram but right now it seems like he feels she is healing well enough this may not even be necessary. We will see patient back for reevaluation in 1 week here in the clinic. If anything worsens or changes patient will contact our office for additional recommendations. Electronic Signature(s) Signed: 02/24/2020 1:24:48 PM By: [name] Keeler PA-C Entered By: [name] [name] on 02/24/2020 13:24:47 -------------------------------------------------------------------------------- SuperBill Details Patient Name: Date of Service: [name] Jeans MIE L. 02/24/2020 Medical Record  Number: [id number] Patient Account Number: [id number] Date of Birth/Sex: Treating RN: 07-Jul-1996 (24 y.o. [name] [name] Primary Care Provider: Merita Norton ULT, PRO Ave [name] Other Clinician: Referring Provider: Treating Provider/Extender: [name] [name] in Treatment: 7 Diagnosis Coding ICD-10 Codes Code Description [phone] Non-pressure chronic ulcer of right calf with fat layer exposed L97.211 Non-pressure chronic ulcer of right calf limited to breakdown of skin M32.19 Other organ or system involvement in systemic lupus erythematosus I73.00 Raynaud's syndrome without gangrene Facility Procedures CPT4 Code: [id number] Description: 99213 - WOUND CARE VISIT-LEV 3 EST PT Modifier: Quantity: 1 Physician Procedures : CPT4 Code Description Modifier [id number] - WC PHYS LEVEL 3 - EST PT ICD-10 Diagnosis Description L97.212 Non-pressure chronic ulcer of right calf with fat layer exposed L97.211 Non-pressure chronic ulcer of right calf limited to breakdown of skin  M32.19 Other organ or system involvement in systemic lupus erythematosus I73.00 Raynaud's syndrome without gangrene Quantity: 1 Electronic Signature(s) Signed: 02/24/2020 1:25:48 PM By: [name] Keeler PA-C Entered By: [name] [name] on 02/24/2020 13:25:47

## 2020-03-09 ENCOUNTER — Other Ambulatory Visit: Payer: Self-pay

## 2020-03-09 ENCOUNTER — Encounter (HOSPITAL_BASED_OUTPATIENT_CLINIC_OR_DEPARTMENT_OTHER): Payer: Medicaid Other | Admitting: Internal Medicine

## 2020-03-09 DIAGNOSIS — L97212 Non-pressure chronic ulcer of right calf with fat layer exposed: Secondary | ICD-10-CM | POA: Diagnosis not present

## 2020-03-09 NOTE — Progress Notes (Signed)
Ra, [place] ([id number]) Visit Report for 03/09/2020 HPI Details Patient Name: Date of Service: [name] [name], [name] [name] 03/09/2020 12:30 PM Medical Record Number: [id number] Patient Account Number: [id number] Date of Birth/Sex: Treating RN: [birth year] (24 y.o. [name] Primary Care Provider: Merita Norton ULT, PRO V IDER Other Clinician: Referring Provider: Treating Provider/Extender: Cheree Ditto in Treatment: 9 History of Present Illness HPI Description: ADMISSION 01/06/2020 This is a complex 24 year old woman who arrives in clinic today for review of wounds on the right lateral leg. She was referred by her rheumatologist Dr. [name] at [facility]. The patient has a history of systemic lupus at age 50 with positive ANA positive double-stranded DNA positive SSA. She also has a history of Raynaud's phenomenon, lupus nephritis stage III previously biopsied. She also has a history of an extensive DVT and apparently pulmonary embolism in September 2020 and she has remained on Xarelto since then. Finally she has a history anticardiolipin antibody positive and positive lupus anticoagulant She tells me that she noted 2 skin lesions on the right lateral calf the larger one proximally and the smaller one medially sometime in January. She apparently was seen in the ER at the time felt to possibly have a current DVT in the leg. Apparently that was worked up and was negative. She was admitted to hospital from 3/26 through 3/29 and was felt to have a flare of her lupus. She was given Augmentin apparently for possible cellulitis in the right lower extremity. She was back in the ER on 3/29 the day she was discharged with pain in the leg. The patient states that the areas currently seen opened in February. She would been putting alcohol on it I think Dr. [name] recently gave her mupirocin. Past medical history; the patient is a non-smoker. She has never been pregnant. As noted she has a significant history of  venous thromboembolic disease although her recent DVT rule out was negative. She has hypertension, lupus nephritis Raynaud's syndrome and depression. She has Medicaid pending which we will have to keep in mind when we prescribe either drugs or wound care products. There currently paying for a lot of this out-of-pocket with help from family members ABI in our clinic was 0.89 on the right 01/13/2020 the patient has 2 areas on the right lateral calf with most significant one is proximal and a very tiny one down towards the ankle. 5/10; ABI on the right at 0.92 however TBI at 0.37 and only monophasic and biphasic waveforms. On the left waveforms were triphasic ABI at 1.12 TBI of 0.54. Although the suggest mild lower extremity arterial disease this woman is only 24 years old I would like her seen by vascular surgery. She has lupus and has had a history of DVT I remember correctly she has anticardiolipin antibody and positive lupus anticoagulant. If we can prove she has arterial disease I will . talk to her rheumatologist about whether she needs to see hematology. I have not given up on the idea of biopsying this although she is on Xarelto. 5/17; she missed her vascular appointment and now has one on 6/11. This is unfortunate. I am looking for some comment about her arterial supply to the foot and whether she has arterial insufficiency at age 50. Her wound actually has come in and there is less depth. She still has undermining areas at roughly 4-6 o'clock and at 12:00. 6/1; vascular appointment on 6/11. She has been using silver collagen and border foam her wound looks as though  it is doing quite nicely. Everything is closed down there is no undermining. She still has roughly "6 I isle" claudication when going up and down the [place] at the grocery store 02/24/2020 upon evaluation today patient appears to be doing well with regard to her wound. This is actually measuring a lot smaller today and in fact appears  to be doing excellent. I am very pleased with this. Fortunately there is no signs of active infection at this time. She did see vascular on 02/18/2020 this was Dr. Monica Martinez. At this point he discussed with her that she does have a right lower extremity DVT he felt that the right toe pressure is marginal for wound healing but since the wound has made significant progress over the past month he felt like that it was appropriate to just allow this to continue to heal and delay the arteriogram for the time being. However he will be seeing her back for follow-up in 1 month time and if anything gets worse he will plan for the arteriogram at that point. As well as potential intervention in regard to the blood flow in the leg. He also felt the wound is not typical in distribution for an arterial disease ulcer and may be related to her lupus. 6/28; 2-week follow-up. Since the last time she was here she went to see Dr. Carlis Abbott of vein and vascular. He likely felt that as her wound was healing, that they would not pursue an angiogram even though her TBI was 0.37 on the right and toe pressure of 52 which is certainly marginal for healing. The biggest question as to why is that would happen in a 24 year old. I suspect she is going to ultimately meet the criteria for antiphospholipid syndrome if she has not already I am just not familiar enough with the diagnostic criteria for this. She follows up with her rheumatologist next month. Electronic Signature(s) Signed: 03/09/2020 5:46:39 PM By: Linton Ham MD Entered By: Linton Ham on 03/09/2020 13:18:20 -------------------------------------------------------------------------------- Physical Exam Details Patient Name: Date of Service: [name] Jeans MIE L. 03/09/2020 12:30 PM Medical Record Number: [id number] Patient Account Number: [id number] Date of Birth/Sex: Treating RN: 12-Jun-1996 (24 y.o. [name] Primary Care Provider: Merita Norton ULT, PRO Ave Filter  Other Clinician: Referring Provider: Treating Provider/Extender: Cheree Ditto in Treatment: 9 Respiratory work of breathing is normal. Cardiovascular I cannot feel her pedal pulses but her popliteal and femoral pulses are palpable.. Notes Wound exam; the patient has epithelialization over the entire area although the new skin is somewhat frail. There is no open wound. No evidence of surrounding infection Electronic Signature(s) Signed: 03/09/2020 5:46:39 PM By: Linton Ham MD Entered By: Linton Ham on 03/09/2020 13:19:35 -------------------------------------------------------------------------------- Physician Orders Details Patient Name: Date of Service: [name] Jeans MIE L. 03/09/2020 12:30 PM Medical Record Number: [id number] Patient Account Number: [id number] Date of Birth/Sex: Treating RN: 1996-02-06 (24 y.o. [name] Primary Care Provider: Merita Norton ULT, PRO V IDER Other Clinician: Referring Provider: Treating Provider/Extender: Cheree Ditto in Treatment: 9 Verbal / Phone Orders: No Diagnosis Coding ICD-10 Coding Code Description [phone] Non-pressure chronic ulcer of right calf with fat layer exposed L97.211 Non-pressure chronic ulcer of right calf limited to breakdown of skin M32.19 Other organ or system involvement in systemic lupus erythematosus I73.00 Raynaud's syndrome without gangrene Discharge From [facility] Discharge from [place] Signature(s) Signed: 03/09/2020 5:46:39 PM By: Linton Ham MD Signed: 03/09/2020 6:01:25 PM By: Levan Hurst RN, BSN Entered By: Levan Hurst  on 03/09/2020 13:12:39 -------------------------------------------------------------------------------- Problem List Details Patient Name: Date of Service: LINETTE, GUNDERSON 03/09/2020 12:30 PM Medical Record Number: [id number] Patient Account Number: [id number] Date of Birth/Sex: Treating RN: [birth year] (24 y.o. [name] Primary Care  Provider: Merita Norton ULT, PRO Ave Filter Other Clinician: Referring Provider: Treating Provider/Extender: Cheree Ditto in Treatment: 9 Active Problems ICD-10 Encounter Code Description Active Date MDM Diagnosis L97.212 Non-pressure chronic ulcer of right calf with fat layer exposed 01/06/2020 No Yes L97.211 Non-pressure chronic ulcer of right calf limited to breakdown of skin 01/06/2020 No Yes M32.19 Other organ or system involvement in systemic lupus erythematosus 01/06/2020 No Yes I73.00 Raynaud's syndrome without gangrene 01/06/2020 No Yes Inactive Problems Resolved Problems Electronic Signature(s) Signed: 03/09/2020 5:46:39 PM By: Linton Ham MD Entered By: Linton Ham on 03/09/2020 13:16:51 -------------------------------------------------------------------------------- Progress Note Details Patient Name: Date of Service: [name] Jeans MIE L. 03/09/2020 12:30 PM Medical Record Number: [id number] Patient Account Number: [id number] Date of Birth/Sex: Treating RN: [birth year] (24 y.o. [name] Primary Care Provider: Merita Norton ULT, PRO V IDER Other Clinician: Referring Provider: Treating Provider/Extender: Cheree Ditto in Treatment: 9 Subjective History of Present Illness (HPI) ADMISSION 01/06/2020 This is a complex 24 year old woman who arrives in clinic today for review of wounds on the right lateral leg. She was referred by her rheumatologist Dr. [name] at [facility]. The patient has a history of systemic lupus at age 8 with positive ANA positive double-stranded DNA positive SSA. She also has a history of Raynaud's phenomenon, lupus nephritis stage III previously biopsied. She also has a history of an extensive DVT and apparently pulmonary embolism in September 2020 and she has remained on Xarelto since then. Finally she has a history anticardiolipin antibody positive and positive lupus anticoagulant She tells me that she noted 2 skin lesions on the right lateral calf the  larger one proximally and the smaller one medially sometime in January. She apparently was seen in the ER at the time felt to possibly have a current DVT in the leg. Apparently that was worked up and was negative. She was admitted to hospital from 3/26 through 3/29 and was felt to have a flare of her lupus. She was given Augmentin apparently for possible cellulitis in the right lower extremity. She was back in the ER on 3/29 the day she was discharged with pain in the leg. The patient states that the areas currently seen opened in February. She would been putting alcohol on it I think Dr. [name] recently gave her mupirocin. Past medical history; the patient is a non-smoker. She has never been pregnant. As noted she has a significant history of venous thromboembolic disease although her recent DVT rule out was negative. She has hypertension, lupus nephritis Raynaud's syndrome and depression. She has Medicaid pending which we will have to keep in mind when we prescribe either drugs or wound care products. There currently paying for a lot of this out-of-pocket with help from family members ABI in our clinic was 0.89 on the right 01/13/2020 the patient has 2 areas on the right lateral calf with most significant one is proximal and a very tiny one down towards the ankle. 5/10; ABI on the right at 0.92 however TBI at 0.37 and only monophasic and biphasic waveforms. On the left waveforms were triphasic ABI at 1.12 TBI of 0.54. Although the suggest mild lower extremity arterial disease this woman is only 24 years old I would like her seen by vascular surgery. She has lupus and  has had a history of DVT I remember correctly she has anticardiolipin antibody and positive lupus anticoagulant. If we can prove she has arterial disease I will . talk to her rheumatologist about whether she needs to see hematology. I have not given up on the idea of biopsying this although she is on Xarelto. 5/17; she missed her vascular  appointment and now has one on 6/11. This is unfortunate. I am looking for some comment about her arterial supply to the foot and whether she has arterial insufficiency at age 46. Her wound actually has come in and there is less depth. She still has undermining areas at roughly 4-6 o'clock and at 12:00. 6/1; vascular appointment on 6/11. She has been using silver collagen and border foam her wound looks as though it is doing quite nicely. Everything is closed down there is no undermining. She still has roughly "6 I isle" claudication when going up and down the [place] at the grocery store 02/24/2020 upon evaluation today patient appears to be doing well with regard to her wound. This is actually measuring a lot smaller today and in fact appears to be doing excellent. I am very pleased with this. Fortunately there is no signs of active infection at this time. She did see vascular on 02/18/2020 this was Dr. Monica Martinez. At this point he discussed with her that she does have a right lower extremity DVT he felt that the right toe pressure is marginal for wound healing but since the wound has made significant progress over the past month he felt like that it was appropriate to just allow this to continue to heal and delay the arteriogram for the time being. However he will be seeing her back for follow-up in 1 month time and if anything gets worse he will plan for the arteriogram at that point. As well as potential intervention in regard to the blood flow in the leg. He also felt the wound is not typical in distribution for an arterial disease ulcer and may be related to her lupus. 6/28; 2-week follow-up. Since the last time she was here she went to see Dr. Carlis Abbott of vein and vascular. He likely felt that as her wound was healing, that they would not pursue an angiogram even though her TBI was 0.37 on the right and toe pressure of 52 which is certainly marginal for healing. The biggest question as to why is  that would happen in a 24 year old. I suspect she is going to ultimately meet the criteria for antiphospholipid syndrome if she has not already I am just not familiar enough with the diagnostic criteria for this. She follows up with her rheumatologist next month. Objective Constitutional Vitals Time Taken: 12:46 PM, Height: 63 in, Weight: 152 lbs, BMI: 26.9, Temperature: 98.4 F, Pulse: 94 bpm, Respiratory Rate: 18 breaths/min, Blood Pressure: 134/93 mmHg. Respiratory work of breathing is normal. Cardiovascular I cannot feel her pedal pulses but her popliteal and femoral pulses are palpable.. General Notes: Wound exam; the patient has epithelialization over the entire area although the new skin is somewhat frail. There is no open wound. No evidence of surrounding infection Integumentary (Hair, Skin) Wound #1 status is Open. Original cause of wound was Gradually Appeared. The wound is located on the Right,Lateral Lower Leg. The wound measures 0cm length x 0cm width x 0cm depth; 0cm^2 area and 0cm^3 volume. There is no tunneling or undermining noted. There is a none present amount of drainage noted. The wound margin is well  defined and not attached to the wound base. There is no granulation within the wound bed. There is no necrotic tissue within the wound bed. Assessment Active Problems ICD-10 Non-pressure chronic ulcer of right calf with fat layer exposed Non-pressure chronic ulcer of right calf limited to breakdown of skin Other organ or system involvement in systemic lupus erythematosus Raynaud's syndrome without gangrene Plan Discharge From [facility] Services: Discharge from [place] 1. The patient can be discharged from the wound care center as her wound is healed 2. I suspect this patient has arterial thrombosis. Her wound is healed but she still has 5 or 6 "I will" claudication when she is walking around the grocery store. 3. She is on Xarelto chronically. I am uncertain about  whether anything else can be done for this woman with lupus who probably has antiphospholipid syndrome Electronic Signature(s) Signed: 03/09/2020 5:46:39 PM By: Linton Ham MD Entered By: Linton Ham on 03/09/2020 13:20:33 -------------------------------------------------------------------------------- SuperBill Details Patient Name: Date of Service: Penelope Coop. 03/09/2020 Medical Record Number: [id number] Patient Account Number: [id number] Date of Birth/Sex: Treating RN: [birth year] (24 y.o. [name] Primary Care Provider: Merita Norton ULT, PRO V IDER Other Clinician: Referring Provider: Treating Provider/Extender: Cheree Ditto in Treatment: 9 Diagnosis Coding ICD-10 Codes Code Description [phone] Non-pressure chronic ulcer of right calf with fat layer exposed L97.211 Non-pressure chronic ulcer of right calf limited to breakdown of skin M32.19 Other organ or system involvement in systemic lupus erythematosus I73.00 Raynaud's syndrome without gangrene Facility Procedures CPT4 Code: [id number] Description: 99213 - WOUND CARE VISIT-LEV 3 EST PT Modifier: Quantity: 1 Physician Procedures : CPT4 Code Description Modifier [id number] - WC PHYS LEVEL 3 - EST PT ICD-10 Diagnosis Description L97.212 Non-pressure chronic ulcer of right calf with fat layer exposed M32.19 Other organ or system involvement in systemic lupus erythematosus Quantity: 1 Electronic Signature(s) Signed: 03/09/2020 5:46:39 PM By: Linton Ham MD Entered By: Linton Ham on 03/09/2020 13:20:59

## 2020-03-10 NOTE — Progress Notes (Signed)
[place], [place] ([id number]) Visit Report for 03/09/2020 Arrival Information Details Patient Name: Date of Service: [name] [name], [name] [name] 03/09/2020 12:30 PM Medical Record Number: [id number] Patient Account Number: [id number] Date of Birth/Sex: Treating RN: 1996/03/11 (24 y.o. Orvan Falconer Primary Care Haston Casebolt: Merita Norton ULT, PRO V IDER Other Clinician: Referring Jonai Weyland: Treating Raylin Winer/Extender: Cheree Ditto in Treatment: 9 Visit Information History Since Last Visit All ordered tests and consults were completed: No Patient Arrived: Ambulatory Added or deleted any medications: No Arrival Time: 12:45 Any new allergies or adverse reactions: No Accompanied By: mother Had a fall or experienced change in No Transfer Assistance: None activities of daily living that may affect Patient Identification Verified: Yes risk of falls: Secondary Verification Process Completed: Yes Signs or symptoms of abuse/neglect since last visito No Patient Requires Transmission-Based Precautions: No Hospitalized since last visit: No Patient Has Alerts: No Implantable device outside of the clinic excluding No cellular tissue based products placed in the center since last visit: Has Dressing in Place as Prescribed: Yes Pain Present Now: No Electronic Signature(s) Signed: 03/10/2020 5:22:48 PM By: Carlene Coria RN Entered By: Carlene Coria on 03/09/2020 12:46:49 -------------------------------------------------------------------------------- Clinic Level of Care Assessment Details Patient Name: Date of Service: [name], [name] 03/09/2020 12:30 PM Medical Record Number: [id number] Patient Account Number: [id number] Date of Birth/Sex: Treating RN: 04-20-1996 (24 y.o. [name] Primary Care Shontelle Muska: Merita Norton ULT, PRO V IDER Other Clinician: Referring Khristine Verno: Treating Shakeya Kerkman/Extender: Cheree Ditto in Treatment: 9 Clinic Level of Care Assessment Items TOOL 4 Quantity Score X- 1 0 Use  when only an EandM is performed on FOLLOW-UP visit ASSESSMENTS - Nursing Assessment / Reassessment X- 1 10 Reassessment of Co-morbidities (includes updates in patient status) X- 1 5 Reassessment of Adherence to Treatment Plan ASSESSMENTS - Wound and Skin A ssessment / Reassessment X - Simple Wound Assessment / Reassessment - one wound 1 5 []  - 0 Complex Wound Assessment / Reassessment - multiple wounds []  - 0 Dermatologic / Skin Assessment (not related to wound area) ASSESSMENTS - Focused Assessment []  - 0 Circumferential Edema Measurements - multi extremities []  - 0 Nutritional Assessment / Counseling / Intervention X- 1 5 Lower Extremity Assessment (monofilament, tuning fork, pulses) []  - 0 Peripheral Arterial Disease Assessment (using hand held doppler) ASSESSMENTS - Ostomy and/or Continence Assessment and Care []  - 0 Incontinence Assessment and Management []  - 0 Ostomy Care Assessment and Management (repouching, etc.) PROCESS - Coordination of Care X - Simple Patient / Family Education for ongoing care 1 15 []  - 0 Complex (extensive) Patient / Family Education for ongoing care X- 1 10 Staff obtains Programmer, systems, Records, T Results / Process Orders est []  - 0 Staff telephones HHA, Nursing Homes / Clarify orders / etc []  - 0 Routine Transfer to another Facility (non-emergent condition) []  - 0 Routine Hospital Admission (non-emergent condition) []  - 0 New Admissions / Biomedical engineer / Ordering NPWT Apligraf, etc. , []  - 0 Emergency Hospital Admission (emergent condition) X- 1 10 Simple Discharge Coordination []  - 0 Complex (extensive) Discharge Coordination PROCESS - Special Needs []  - 0 Pediatric / Minor Patient Management []  - 0 Isolation Patient Management []  - 0 Hearing / Language / Visual special needs []  - 0 Assessment of Community assistance (transportation, D/C planning, etc.) []  - 0 Additional assistance / Altered mentation []  - 0 Support  Surface(s) Assessment (bed, cushion, seat, etc.) INTERVENTIONS - Wound Cleansing / Measurement X - Simple Wound Cleansing - one wound 1 5 []  -  0 Complex Wound Cleansing - multiple wounds X- 1 5 Wound Imaging (photographs - any number of wounds) []  - 0 Wound Tracing (instead of photographs) X- 1 5 Simple Wound Measurement - one wound []  - 0 Complex Wound Measurement - multiple wounds INTERVENTIONS - Wound Dressings []  - 0 Small Wound Dressing one or multiple wounds []  - 0 Medium Wound Dressing one or multiple wounds []  - 0 Large Wound Dressing one or multiple wounds []  - 0 Application of Medications - topical []  - 0 Application of Medications - injection INTERVENTIONS - Miscellaneous []  - 0 External ear exam []  - 0 Specimen Collection (cultures, biopsies, blood, body fluids, etc.) []  - 0 Specimen(s) / Culture(s) sent or taken to Lab for analysis []  - 0 Patient Transfer (multiple staff / Civil Service fast streamer / Similar devices) []  - 0 Simple Staple / Suture removal (25 or less) []  - 0 Complex Staple / Suture removal (26 or more) []  - 0 Hypo / Hyperglycemic Management (close monitor of Blood Glucose) []  - 0 Ankle / Brachial Index (ABI) - do not check if billed separately X- 1 5 Vital Signs Has the patient been seen at the hospital within the last three years: Yes Total Score: 80 Level Of Care: New/Established - Level 3 Electronic Signature(s) Signed: 03/09/2020 6:01:25 PM By: Levan Hurst RN, BSN Entered By: Levan Hurst on 03/09/2020 13:13:32 -------------------------------------------------------------------------------- Encounter Discharge Information Details Patient Name: Date of Service: [name] Jeans MIE L. 03/09/2020 12:30 PM Medical Record Number: [id number] Patient Account Number: [id number] Date of Birth/Sex: Treating RN: 08-08-1996 (24 y.o. [name] Primary Care Wavie Hashimi: Merita Norton ULT, PRO Ave Filter Other Clinician: Referring Nichelle Renwick: Treating Anaiah Mcmannis/Extender:  Cheree Ditto in Treatment: 9 Encounter Discharge Information Items Discharge Condition: Stable Ambulatory Status: Ambulatory Discharge Destination: Home Transportation: Private Auto Accompanied By: mother Schedule Follow-up Appointment: Yes Clinical Summary of Care: Patient Declined Electronic Signature(s) Signed: 03/10/2020 5:33:24 PM By: Baruch Gouty RN, BSN Entered By: Baruch Gouty on 03/09/2020 14:31:53 -------------------------------------------------------------------------------- Lower Extremity Assessment Details Patient Name: Date of Service: [name] Jeans MIE L. 03/09/2020 12:30 PM Medical Record Number: [id number] Patient Account Number: [id number] Date of Birth/Sex: Treating RN: September 07, 1996 (24 y.o. Orvan Falconer Primary Care Marguerite Barba: Merita Norton ULT, PRO V IDER Other Clinician: Referring Ajaya Crutchfield: Treating Panayiotis [place]ville/Extender: Cheree Ditto in Treatment: 9 Edema Assessment Assessed: [Left: No] [Right: No] Edema: [Left: N] [Right: o] Calf Left: Right: Point of Measurement: 39 cm From Medial Instep cm 33.2 cm Ankle Left: Right: Point of Measurement: 9 cm From Medial Instep cm 20 cm Electronic Signature(s) Signed: 03/10/2020 5:22:48 PM By: Carlene Coria RN Entered By: Carlene Coria on 03/09/2020 12:51:16 -------------------------------------------------------------------------------- [place] Details Patient Name: Date of Service: [name] Jeans MIE L. 03/09/2020 12:30 PM Medical Record Number: [id number] Patient Account Number: [id number] Date of Birth/Sex: Treating RN: [birth year] (24 y.o. [name] Primary Care Omega Durante: Merita Norton ULT, PRO V IDER Other Clinician: Referring Khylee Algeo: Treating Benelli Winther/Extender: Cheree Ditto in Treatment: 9 Active Inactive Electronic Signature(s) Signed: 03/09/2020 6:01:25 PM By: Levan Hurst RN, BSN Entered By: Levan Hurst on 03/09/2020  13:12:56 -------------------------------------------------------------------------------- Pain Assessment Details Patient Name: Date of Service: [name] Jeans MIE L. 03/09/2020 12:30 PM Medical Record Number: [id number] Patient Account Number: [id number] Date of Birth/Sex: Treating RN: 1996-03-10 (24 y.o. Orvan Falconer Primary Care Demari Gales: Merita Norton ULT, PRO Ave Filter Other Clinician: Referring Shaya Altamura: Treating Brinna Divelbiss/Extender: Cheree Ditto in Treatment: 9 Active Problems Location of Pain Severity and Description of Pain Patient Has Paino No Site  Locations Pain Management and Medication Current Pain Management: Electronic Signature(s) Signed: 03/10/2020 5:22:48 PM By: Carlene Coria RN Entered By: Carlene Coria on 03/09/2020 12:47:43 -------------------------------------------------------------------------------- Patient/Caregiver Education Details Patient Name: Date of Service: Penelope Coop 6/28/2021andnbsp12:30 PM Medical Record Number: [id number] Patient Account Number: [id number] Date of Birth/Gender: Treating RN: June 24, 1996 (24 y.o. [name] Primary Care Physician: Merita Norton ULT, PRO V IDER Other Clinician: Referring Physician: Treating Physician/Extender: Cheree Ditto in Treatment: 9 Education Assessment Education Provided To: Patient Education Topics Provided Wound/Skin Impairment: Methods: Explain/Verbal Responses: State content correctly Electronic Signature(s) Signed: 03/09/2020 6:01:25 PM By: Levan Hurst RN, BSN Entered By: Levan Hurst on 03/09/2020 13:13:08 -------------------------------------------------------------------------------- Wound Assessment Details Patient Name: Date of Service: [name] Jeans MIE L. 03/09/2020 12:30 PM Medical Record Number: [id number] Patient Account Number: [id number] Date of Birth/Sex: Treating RN: 28-Aug-1996 (24 y.o. Orvan Falconer Primary Care Aracelia Brinson: Merita Norton ULT, PRO V IDER Other Clinician: Referring  Gurley Climer: Treating Phillippe Orlick/Extender: Cheree Ditto in Treatment: 9 Wound Status Wound Number: 1 Primary Etiology: Inflammatory Wound Location: Right, Lateral Lower Leg Wound Status: Open Wounding Event: Gradually Appeared Comorbid History: Hypertension, Lupus Erythematosus, Raynauds Date Acquired: 10/14/2019 Weeks Of Treatment: 9 Clustered Wound: No Photos Photo Uploaded By: Mikeal Hawthorne on 03/09/2020 13:58:19 Wound Measurements Length: (cm) Width: (cm) Depth: (cm) Area: (cm) Volume: (cm) 0 % Reduction in Area: 100% 0 % Reduction in Volume: 100% 0 Epithelialization: Medium (34-66%) 0 Tunneling: No 0 Undermining: No Wound Description Classification: Full Thickness Without Exposed Support Structures Wound Margin: Well defined, not attached Exudate Amount: None Present Foul Odor After Cleansing: No Slough/Fibrino No Wound Bed Granulation Amount: None Present (0%) Exposed Structure Necrotic Amount: None Present (0%) Fascia Exposed: No Fat Layer (Subcutaneous Tissue) Exposed: No Tendon Exposed: No Muscle Exposed: No Joint Exposed: No Bone Exposed: No Electronic Signature(s) Signed: 03/10/2020 5:22:48 PM By: Carlene Coria RN Entered By: Carlene Coria on 03/09/2020 12:51:36 -------------------------------------------------------------------------------- [place] Details Patient Name: Date of Service: [name] Jeans MIE L. 03/09/2020 12:30 PM Medical Record Number: [id number] Patient Account Number: [id number] Date of Birth/Sex: Treating RN: 11-25-1995 (24 y.o. Orvan Falconer Primary Care Milissa Fesperman: Merita Norton ULT, PRO V IDER Other Clinician: Referring Eular Panek: Treating Edison Wollschlager/Extender: Cheree Ditto in Treatment: 9 Vital Signs Time Taken: 12:46 Temperature (F): 98.4 Height (in): 63 Pulse (bpm): 94 Weight (lbs): 152 Respiratory Rate (breaths/min): 18 Body Mass Index (BMI): 26.9 Blood Pressure (mmHg): 134/93 Reference Range: 80 - 120 mg / dl Electronic  Signature(s) Signed: 03/10/2020 5:22:48 PM By: Carlene Coria RN Entered By: Carlene Coria on 03/09/2020 12:47:20

## 2020-03-23 ENCOUNTER — Other Ambulatory Visit: Payer: Self-pay

## 2020-03-23 ENCOUNTER — Ambulatory Visit (INDEPENDENT_AMBULATORY_CARE_PROVIDER_SITE_OTHER): Payer: Self-pay | Admitting: Nurse Practitioner

## 2020-03-23 ENCOUNTER — Encounter: Payer: Self-pay | Admitting: Nurse Practitioner

## 2020-03-23 VITALS — BP 124/82 | HR 106 | Temp 98.0°F | Ht 63.0 in | Wt 153.0 lb

## 2020-03-23 DIAGNOSIS — N898 Other specified noninflammatory disorders of vagina: Secondary | ICD-10-CM

## 2020-03-23 DIAGNOSIS — F3289 Other specified depressive episodes: Secondary | ICD-10-CM

## 2020-03-23 DIAGNOSIS — B373 Candidiasis of vulva and vagina: Secondary | ICD-10-CM

## 2020-03-23 DIAGNOSIS — B3731 Acute candidiasis of vulva and vagina: Secondary | ICD-10-CM

## 2020-03-23 LAB — POCT URINALYSIS DIPSTICK
Bilirubin, UA: NEGATIVE
Blood, UA: NEGATIVE
Glucose, UA: NEGATIVE
Ketones, UA: NEGATIVE
Leukocytes, UA: NEGATIVE
Nitrite, UA: NEGATIVE
Protein, UA: POSITIVE — AB
Spec Grav, UA: >=1.03 — AB (ref 1.010–1.025)
Urobilinogen, UA: 0.2 E.U./dL
pH, UA: 6.5 (ref 5.0–8.0)

## 2020-03-23 MED ORDER — FLUCONAZOLE 150 MG PO TABS: 150.0000 mg | ORAL_TABLET | Freq: Once | ORAL | 0 refills | Status: AC

## 2020-03-23 MED ORDER — METRONIDAZOLE 500 MG PO TABS
500.0000 mg | ORAL_TABLET | Freq: Three times a day (TID) | ORAL | 0 refills | Status: DC
Start: 2020-03-23 — End: 2020-06-24

## 2020-03-23 NOTE — Progress Notes (Signed)
[place], [place]  [address] Phone:  [phone]   Fax:  [phone]   Established Patient Office Visit  Subjective:  Patient ID: [name] [name], female    DOB: 1995-12-13  Age: 24 y.o. MRN: [id number]  CC:  Chief Complaint  Patient presents with   Acute Visit    vaginal itching, burning, discharge no odor. Tried Vagisil OTC with no relief.     HPI [name] [name] presents for follow up. She  has a past medical history of Accidental methotrexate overdose (01/08/2018), Acute lower UTI, Bronchitis (03/19/2019), Cardiomegaly (05/08/2019), Chronic midline low back pain without sciatica (11/09/2016), Dehydration (03/19/2019), Depo-Provera contraceptive status (08/11/2015), Depression (02/08/2017), Dysphagia (06/01/2018), Dysuria (04/19/2019), Elevated AST (SGOT) (03/19/2019), Essential hypertension (03/19/2019), Facial edema, Gout, Hypertension, IDA (iron deficiency anemia) (08/11/2015), Immunosuppressed status ([place]), Insomnia (01/29/2016), Leukopenia (01/10/2018), Lobar pneumonia ([place]) (06/07/2018), Lupus ([place]), Lupus (systemic lupus erythematosus) ([place]) (09/21/5091), Metabolic acidosis (10/18/7122), Methotrexate toxicity, Mouth ulcers, Mucositis oral (01/12/2018), Odynophagia (06/01/2018), Oral thrush (03/19/2019), Pleuritic chest pain (06/05/2018), Primary osteoarthritis of both knees (11/09/2016), Rash due to systemic lupus erythematosus (SLE) ([facility]), Raynaud disease, Raynaud's disease (08/11/2015), Relative acute adrenal insufficiency ([place]) (06/11/2018), Right leg DVT ([place]) (05/08/2019), Seasonal allergies (08/11/2015), Sepsis ([place]) (05/08/2019), Sinus tachycardia (03/19/2019), SLE (systemic lupus erythematosus) ([place]) (09/19/2016), Thrush (06/01/2018), Vaginal candidiasis (01/12/2018), and Vitamin D deficiency (11/09/2016).   Vaginitis Patient presents for evaluation of an abnormal vaginal discharge. Symptoms have been present for 1 week. Vaginal symptoms: burning, discharge described as  white and local irritation. Contraception: none. She denies abnormal bleeding, blisters, bumps, odor, tears and warts. Sexually transmitted infection risk: very low risk of STD exposure. Menstrual flow: irregular last cycle 2/21  Depression Patient complains of depression. She complains of depressed mood. Onset was approximately several months ago. Symptoms have been gradually worsening since that time. Current symptoms include: anhedonia, depressed mood and fatigue. Patient denies feelings of worthlessness/guilt, impaired memory, recurrent thoughts of death and suicidal attempt. Family history significant for Unknown. Possible organic causes contributing are: none. Risk factors: previous episode of depression and Chronic disease that is changing. Previous treatment includes medication. She complains of the following side effects from the treatment: none.  She is not sure whether the Wellbutrin is effective.   Past Medical History:  Diagnosis Date   Accidental methotrexate overdose 01/08/2018   Acute lower UTI    Bronchitis 03/19/2019   Cardiomegaly 05/08/2019   Chronic midline low back pain without sciatica 11/09/2016   Facet joint arthropathy   Dehydration 03/19/2019   Depo-Provera contraceptive status 08/11/2015   Depression 02/08/2017   Dysphagia 06/01/2018   Dysuria 04/19/2019   Elevated AST (SGOT) 03/19/2019   Essential hypertension 03/19/2019   Facial edema    Gout    Hypertension    IDA (iron deficiency anemia) 08/11/2015   Immunosuppressed status ([place])    Insomnia 01/29/2016   Leukopenia 01/10/2018   Lobar pneumonia ([place]) 06/07/2018   Lupus ([place])    Lupus (systemic lupus erythematosus) ([place]) 01/17/997   Metabolic acidosis 11/12/8248   Methotrexate toxicity    Mouth ulcers    Mucositis oral 01/12/2018   Odynophagia 06/01/2018   Oral thrush 03/19/2019   Pleuritic chest pain 06/05/2018   Primary osteoarthritis of both knees 11/09/2016   Rash due to systemic lupus  erythematosus (SLE) ([facility])    Raynaud disease    Raynaud's disease 08/11/2015   Relative acute adrenal insufficiency ([place]) 06/11/2018   Right leg DVT ([place]) 05/08/2019  Seasonal allergies 08/11/2015   Sepsis ([place]) 05/08/2019   Sinus tachycardia 03/19/2019   SLE (systemic lupus erythematosus) ([place]) 09/19/2016     Positive ANA, double-stranded DNA, Ro, RNP, RF, history of nasal ulcers, fatigue, anemia, arthritis, Raynauds   Thrush 06/01/2018   Vaginal candidiasis 01/12/2018   Vitamin D deficiency 11/09/2016    Past Surgical History:  Procedure Laterality Date   ORIF SHOULDER FRACTURE Left    TONSILLECTOMY      Family History  Problem Relation Age of Onset   Aneurysm Mother    Stroke Sister    Seizures Sister    Lung cancer Maternal Aunt    Heart attack Paternal Uncle    Colon cancer Neg Hx    Esophageal cancer Neg Hx    Pancreatic cancer Neg Hx    Stomach cancer Neg Hx     Social History   Socioeconomic History   Marital status: Single    Spouse name: Not on file   Number of children: Not on file   Years of education: Not on file   Highest education level: Not on file  Occupational History   Not on file  Tobacco Use   Smoking status: Never Smoker   Smokeless tobacco: Never Used  Vaping Use   Vaping Use: Never used  Substance and Sexual Activity   Alcohol use: No   Drug use: No   Sexual activity: Not Currently  Other Topics Concern   Not on file  Social History Narrative   Not on file   Social Determinants of Health   Financial Resource Strain:    Difficulty of Paying Living Expenses:   Food Insecurity:    Worried About Charity fundraiser in the Last Year:    Arboriculturist in the Last Year:   Transportation Needs:    Film/video editor (Medical):    Lack of Transportation (Non-Medical):   Physical Activity:    Days of Exercise per Week:    Minutes of Exercise per Session:   Stress:    Feeling of Stress :    Social Connections:    Frequency of Communication with Friends and Family:    Frequency of Social Gatherings with Friends and Family:    Attends Religious Services:    Active Member of Clubs or Organizations:    Attends Music therapist:    Marital Status:   Intimate Partner Violence:    Fear of Current or Ex-Partner:    Emotionally Abused:    Physically Abused:    Sexually Abused:     Outpatient Medications Prior to Visit  Medication Sig Dispense Refill   amLODipine (NORVASC) 10 MG tablet Take 1 tablet (10 mg total) by mouth daily. 30 tablet 0   buPROPion (WELLBUTRIN XL) 300 MG 24 hr tablet TAKE 1 TABLET BY MOUTH EVERY DAY 90 tablet 1   ferrous sulfate 325 (65 FE) MG tablet Take 1 tablet (325 mg total) by mouth 2 (two) times daily. With 1/2 glass of orange juice 30 tablet 0   folic acid (FOLVITE) 1 MG tablet Take 1 tablet (1 mg total) by mouth at bedtime. 30 tablet 0   gabapentin (NEURONTIN) 300 MG capsule TAKE 1 CAPSULE BY MOUTH THREE TIMES A DAY 90 capsule 3   lisinopril (ZESTRIL) 20 MG tablet Take 20 mg by mouth daily.     Multiple Vitamins-Minerals (MULTIVITAMIN ADULT PO) Take 1 tablet by mouth daily.     rivaroxaban (XARELTO) 10 MG TABS tablet  Take 1 tablet (10 mg total) by mouth daily with supper. 30 tablet 6   tacrolimus (PROGRAF) 1 MG capsule Take 1 mg by mouth 2 (two) times daily.     traZODone (DESYREL) 100 MG tablet Take 1 tablet (100 mg total) by mouth at bedtime. 30 tablet 3   Vitamin D, Ergocalciferol, (DRISDOL) 50000 units CAPS capsule Take 1 capsule (50,000 Units total) by mouth 2 (two) times a week. 24 capsule 0   omeprazole (PRILOSEC) 20 MG capsule Take 1 capsule (20 mg total) by mouth daily. (Patient not taking: Reported on 03/23/2020) 30 capsule 3   acetaminophen-codeine (TYLENOL #3) 300-30 MG tablet Take 1 tablet by mouth every 6 (six) hours as needed for moderate pain. (Patient not taking: Reported on 02/18/2020) 15 tablet 0    loperamide (IMODIUM) 2 MG capsule Take 1 capsule (2 mg total) by mouth as needed for diarrhea or loose stools. (Patient not taking: Reported on 03/23/2020) 10 capsule 0   metoprolol tartrate (LOPRESSOR) 25 MG tablet Take 1 tablet (25 mg total) by mouth 2 (two) times daily. 30 tablet 0   mycophenolate (CELLCEPT) 250 MG capsule Take 500 mg by mouth 2 (two) times daily.     No facility-administered medications prior to visit.    Allergies  Allergen Reactions   Latex Rash    ROS Review of Systems  All other systems reviewed and are negative.     Objective:    Physical Exam Constitutional:      General: She is not in acute distress.    Appearance: She is not ill-appearing.  HENT:     Head: Normocephalic and atraumatic.  Cardiovascular:     Rate and Rhythm: Normal rate and regular rhythm.     Pulses: Normal pulses.     Heart sounds: Normal heart sounds.  Pulmonary:     Effort: Pulmonary effort is normal.     Comments: Decreased breath sounds Musculoskeletal:     Cervical back: Normal range of motion.  Skin:    General: Skin is dry.     Findings: Rash present.     Comments: Diffuse discoloration especially on face and bridge of nose  Neurological:     Mental Status: She is alert and oriented to person, place, and time.  Psychiatric:        Mood and Affect: Mood normal.        Behavior: Behavior normal.        Thought Content: Thought content normal.        Judgment: Judgment normal.     BP 124/82    Pulse (!) 106    Temp 98 F (36.7 C)    Ht 5\' 3"  (1.6 m)    Wt 153 lb (69.4 kg)    SpO2 99%    BMI 27.10 kg/m  Wt Readings from Last 3 Encounters:  03/23/20 153 lb (69.4 kg)  02/18/20 147 lb (66.7 kg)  01/28/20 139 lb (63 kg)     Health Maintenance Due  Topic Date Due   Hepatitis C Screening  Never done   COVID-19 Vaccine (1) Never done   TETANUS/TDAP  Never done   PAP-Cervical Cytology Screening  02/15/2020   PAP SMEAR-Modifier  02/15/2020    There are  no preventive care reminders to display for this patient.  Lab Results  Component Value Date   TSH 1.180 12/07/2019   Lab Results  Component Value Date   WBC 5.6 12/09/2019   HGB 11.5 (L) 12/09/2019  HCT 38.1 12/09/2019   MCV 83.6 12/09/2019   PLT 207 12/09/2019   Lab Results  Component Value Date   NA 139 12/09/2019   K 5.1 12/09/2019   CO2 24 12/09/2019   GLUCOSE 107 (H) 12/09/2019   BUN 16 12/09/2019   CREATININE 0.51 12/09/2019   BILITOT 0.5 12/06/2019   ALKPHOS 74 12/06/2019   AST 37 12/06/2019   ALT 17 12/06/2019   PROT 6.9 12/06/2019   ALBUMIN 2.6 (L) 12/06/2019   CALCIUM 9.4 12/09/2019   ANIONGAP 9 12/09/2019   No results found for: CHOL No results found for: HDL No results found for: LDLCALC No results found for: TRIG No results found for: CHOLHDL Lab Results  Component Value Date   HGBA1C 4.5 (L) 03/20/2019      Assessment & Plan:   Problem List Items Addressed This Visit      Genitourinary   Vaginal candidiasis   Relevant Medications   metroNIDAZOLE (FLAGYL) 500 MG tablet   Other Relevant Orders   NuSwab Vaginitis Plus (VG+) Encourage use of Diflucan 150 mg x 1 today and wait for results to start Flagyl less symptoms persist     Other   Depression   Relevant Orders   Ambulatory referral to Psychiatry Evaluation patient is open to discussion    Other Visit Diagnoses    Vaginal discharge    -  Primary   Relevant Orders   Urinalysis Dipstick (Completed)   NuSwab Vaginitis Plus (VG+)      Meds ordered this encounter  Medications   fluconazole (DIFLUCAN) 150 MG tablet    Sig: Take 1 tablet (150 mg total) by mouth once for 1 dose.    Dispense:  1 tablet    Refill:  0    Order Specific Question:   Supervising Provider    Answer:   Tresa Garter [[id number]]   metroNIDAZOLE (FLAGYL) 500 MG tablet    Sig: Take 1 tablet (500 mg total) by mouth 3 (three) times daily.    Dispense:  21 tablet    Refill:  0    Order Specific  Question:   Supervising Provider    Answer:   Tresa Garter [[id number]]    Follow-up: Return in about 3 months (around 06/23/2020).    Vevelyn Francois, NP

## 2020-03-23 NOTE — Patient Instructions (Signed)
Vaginal Yeast Infection, Adult  Vaginal yeast infection is a condition that causes vaginal discharge as well as soreness, swelling, and redness (inflammation) of the vagina. This is a common condition. Some women get this infection frequently. What are the causes? This condition is caused by a change in the normal balance of the yeast (candida) and bacteria that live in the vagina. This change causes an overgrowth of yeast, which causes the inflammation. What increases the risk? The condition is more likely to develop in women who:  Take antibiotic medicines.  Have diabetes.  Take birth control pills.  Are pregnant.  Douche often.  Have a weak body defense system (immune system).  Have been taking steroid medicines for a long time.  Frequently wear tight clothing. What are the signs or symptoms? Symptoms of this condition include:  White, thick, creamy vaginal discharge.  Swelling, itching, redness, and irritation of the vagina. The lips of the vagina (vulva) may be affected as well.  Pain or a burning feeling while urinating.  Pain during sex. How is this diagnosed? This condition is diagnosed based on:  Your medical history.  A physical exam.  A pelvic exam. Your health care provider will examine a sample of your vaginal discharge under a microscope. Your health care provider may send this sample for testing to confirm the diagnosis. How is this treated? This condition is treated with medicine. Medicines may be over-the-counter or prescription. You may be told to use one or more of the following:  Medicine that is taken by mouth (orally).  Medicine that is applied as a cream (topically).  Medicine that is inserted directly into the vagina (suppository). Follow these instructions at home:  Lifestyle  Do not have sex until your health care provider approves. Tell your sex partner that you have a yeast infection. That person should go to his or her health care  provider and ask if they should also be treated.  Do not wear tight clothes, such as pantyhose or tight pants.  Wear breathable cotton underwear. General instructions  Take or apply over-the-counter and prescription medicines only as told by your health care provider.  Eat more yogurt. This may help to keep your yeast infection from returning.  Do not use tampons until your health care provider approves.  Try taking a sitz bath to help with discomfort. This is a warm water bath that is taken while you are sitting down. The water should only come up to your hips and should cover your buttocks. Do this 3-4 times per day or as told by your health care provider.  Do not douche.  If you have diabetes, keep your blood sugar levels under control.  Keep all follow-up visits as told by your health care provider. This is important. Contact a health care provider if:  You have a fever.  Your symptoms go away and then return.  Your symptoms do not get better with treatment.  Your symptoms get worse.  You have new symptoms.  You develop blisters in or around your vagina.  You have blood coming from your vagina and it is not your menstrual period.  You develop pain in your abdomen. Summary  Vaginal yeast infection is a condition that causes discharge as well as soreness, swelling, and redness (inflammation) of the vagina.  This condition is treated with medicine. Medicines may be over-the-counter or prescription.  Take or apply over-the-counter and prescription medicines only as told by your health care provider.  Do not douche.   Do not have sex or use tampons until your health care provider approves.  Contact a health care provider if your symptoms do not get better with treatment or your symptoms go away and then return. This information is not intended to replace advice given to you by your health care provider. Make sure you discuss any questions you have with your health care  provider. Document Revised: 03/29/2019 Document Reviewed: 01/15/2018 Elsevier Patient Education  2020 Elsevier Inc.  

## 2020-03-24 ENCOUNTER — Ambulatory Visit (INDEPENDENT_AMBULATORY_CARE_PROVIDER_SITE_OTHER): Payer: Self-pay | Admitting: Vascular Surgery

## 2020-03-24 ENCOUNTER — Encounter: Payer: Self-pay | Admitting: Vascular Surgery

## 2020-03-24 VITALS — BP 116/79 | HR 80 | Temp 97.7°F | Resp 16 | Ht 63.0 in | Wt 149.0 lb

## 2020-03-24 DIAGNOSIS — I739 Peripheral vascular disease, unspecified: Secondary | ICD-10-CM

## 2020-03-24 NOTE — Progress Notes (Signed)
Patient name: [name] [name] MRN: [id number] DOB: Nov 30, 1995 Sex: female  REASON FOR CONSULT: 1 month follow-up for wound check of right lower extremity  HPI: [name] [name] is a 24 y.o. female, with history of hypertension, depression, lupus, previous right lower extremity DVT on anticoagulation that presents for 1 month follow-up of right shin wound.  She was initially evaluated for right shin wound that had been present for about 4 to 6 months with no associated trauma.  She was going to the wound clinic.  We elected no intervention since her mom stated this was making improvements and was becoming more shallow when I initially evaluated her.  On follow-up today states the wound is now completely healed.  She has been released from the wound care clinic by Dr. Dellia Nims.  She has no other specific concerns today.  Past Medical History:  Diagnosis Date  . Accidental methotrexate overdose 01/08/2018  . Acute lower UTI   . Bronchitis 03/19/2019  . Cardiomegaly 05/08/2019  . Chronic midline low back pain without sciatica 11/09/2016   Facet joint arthropathy  . Dehydration 03/19/2019  . Depo-Provera contraceptive status 08/11/2015  . Depression 02/08/2017  . Dysphagia 06/01/2018  . Dysuria 04/19/2019  . Elevated AST (SGOT) 03/19/2019  . Essential hypertension 03/19/2019  . Facial edema   . Gout   . Hypertension   . IDA (iron deficiency anemia) 08/11/2015  . Immunosuppressed status ([place])   . Insomnia 01/29/2016  . Leukopenia 01/10/2018  . Lobar pneumonia ([place]) 06/07/2018  . Lupus ([place])   . Lupus (systemic lupus erythematosus) ([place]) 03/19/2019  . Metabolic acidosis 09/20/1476  . Methotrexate toxicity   . Mouth ulcers   . Mucositis oral 01/12/2018  . Odynophagia 06/01/2018  . Oral thrush 03/19/2019  . Pleuritic chest pain 06/05/2018  . Primary osteoarthritis of both knees 11/09/2016  . Rash due to systemic lupus erythematosus (SLE) ([place])   . Raynaud disease   . Raynaud's disease 08/11/2015  . Relative acute  adrenal insufficiency ([place]) 06/11/2018  . Right leg DVT ([place]) 05/08/2019  . Seasonal allergies 08/11/2015  . Sepsis ([place]) 05/08/2019  . Sinus tachycardia 03/19/2019  . SLE (systemic lupus erythematosus) ([place]) 09/19/2016     Positive ANA, double-stranded DNA, Ro, RNP, RF, history of nasal ulcers, fatigue, anemia, arthritis, Raynauds  . Thrush 06/01/2018  . Vaginal candidiasis 01/12/2018  . Vitamin D deficiency 11/09/2016    Past Surgical History:  Procedure Laterality Date  . ORIF SHOULDER FRACTURE Left   . TONSILLECTOMY      Family History  Problem Relation Age of Onset  . Aneurysm Mother   . Stroke Sister   . Seizures Sister   . Lung cancer Maternal Aunt   . Heart attack Paternal Uncle   . Colon cancer Neg Hx   . Esophageal cancer Neg Hx   . Pancreatic cancer Neg Hx   . Stomach cancer Neg Hx     SOCIAL HISTORY: Social History   Socioeconomic History  . Marital status: Single    Spouse name: Not on file  . Number of children: Not on file  . Years of education: Not on file  . Highest education level: Not on file  Occupational History  . Not on file  Tobacco Use  . Smoking status: Never Smoker  . Smokeless tobacco: Never Used  Vaping Use  . Vaping Use: Never used  Substance and Sexual Activity  . Alcohol use: No  . Drug use: No  . Sexual activity:  Not Currently  Other Topics Concern  . Not on file  Social History Narrative  . Not on file   Social Determinants of Health   Financial Resource Strain:   . Difficulty of Paying Living Expenses:   Food Insecurity:   . Worried About Charity fundraiser in the Last Year:   . Arboriculturist in the Last Year:   Transportation Needs:   . Film/video editor (Medical):   Marland Kitchen Lack of Transportation (Non-Medical):   Physical Activity:   . Days of Exercise per Week:   . Minutes of Exercise per Session:   Stress:   . Feeling of Stress :   Social Connections:   . Frequency of Communication with Friends and Family:   .  Frequency of Social Gatherings with Friends and Family:   . Attends Religious Services:   . Active Member of Clubs or Organizations:   . Attends Archivist Meetings:   Marland Kitchen Marital Status:   Intimate Partner Violence:   . Fear of Current or Ex-Partner:   . Emotionally Abused:   Marland Kitchen Physically Abused:   . Sexually Abused:     Allergies  Allergen Reactions  . Latex Rash    Current Outpatient Medications  Medication Sig Dispense Refill  . buPROPion (WELLBUTRIN XL) 300 MG 24 hr tablet TAKE 1 TABLET BY MOUTH EVERY DAY 90 tablet 1  . ferrous sulfate 325 (65 FE) MG tablet Take 1 tablet (325 mg total) by mouth 2 (two) times daily. With 1/2 glass of orange juice 30 tablet 0  . folic acid (FOLVITE) 1 MG tablet Take 1 tablet (1 mg total) by mouth at bedtime. 30 tablet 0  . gabapentin (NEURONTIN) 300 MG capsule TAKE 1 CAPSULE BY MOUTH THREE TIMES A DAY 90 capsule 3  . metroNIDAZOLE (FLAGYL) 500 MG tablet Take 1 tablet (500 mg total) by mouth 3 (three) times daily. 21 tablet 0  . Multiple Vitamins-Minerals (MULTIVITAMIN ADULT PO) Take 1 tablet by mouth daily.    Marland Kitchen omeprazole (PRILOSEC) 20 MG capsule Take 1 capsule (20 mg total) by mouth daily. 30 capsule 3  . rivaroxaban (XARELTO) 10 MG TABS tablet Take 1 tablet (10 mg total) by mouth daily with supper. 30 tablet 6  . tacrolimus (PROGRAF) 1 MG capsule Take 1 mg by mouth 2 (two) times daily.    . traZODone (DESYREL) 100 MG tablet Take 1 tablet (100 mg total) by mouth at bedtime. 30 tablet 3  . Vitamin D, Ergocalciferol, (DRISDOL) 50000 units CAPS capsule Take 1 capsule (50,000 Units total) by mouth 2 (two) times a week. 24 capsule 0  . amLODipine (NORVASC) 10 MG tablet Take 1 tablet (10 mg total) by mouth daily. 30 tablet 0  . lisinopril (ZESTRIL) 20 MG tablet Take 20 mg by mouth daily. (Patient not taking: Reported on 03/24/2020)     No current facility-administered medications for this visit.    REVIEW OF SYSTEMS:  [X]  denotes positive  finding, [ ]  denotes negative finding Cardiac  Comments:  Chest pain or chest pressure:    Shortness of breath upon exertion:    Short of breath when lying flat:    Irregular heart rhythm:        Vascular    Pain in calf, thigh, or hip brought on by ambulation:    Pain in feet at night that wakes you up from your sleep:     Blood clot in your veins:    Leg swelling:  Pulmonary    Oxygen at home:    Productive cough:     Wheezing:         Neurologic    Sudden weakness in arms or legs:     Sudden numbness in arms or legs:     Sudden onset of difficulty speaking or slurred speech:    Temporary loss of vision in one eye:     Problems with dizziness:         Gastrointestinal    Blood in stool:     Vomited blood:         Genitourinary    Burning when urinating:     Blood in urine:        Psychiatric    Major depression:         Hematologic    Bleeding problems:    Problems with blood clotting too easily:        Skin    Rashes or ulcers:        Constitutional    Fever or chills:      PHYSICAL EXAM: Vitals:   03/24/20 0956  BP: 116/79  Pulse: 80  Resp: 16  Temp: 97.7 F (36.5 C)  TempSrc: Temporal  SpO2: 96%  Weight: 149 lb (67.6 kg)  Height: 5\' 3"  (1.6 m)    GENERAL: The patient is a well-nourished female, in no acute distress. The vital signs are documented above. CARDIAC: There is a regular rate and rhythm.  VASCULAR:  Palpable femoral pulses bilaterally No palpable right pedal pulses Right shin wound now healed PULMONARY: There is good air exchange bilaterally without wheezing or rales. ABDOMEN: Soft and non-tender with normal pitched bowel sounds.  MUSCULOSKELETAL: There are no major deformities or cyanosis. NEUROLOGIC: No focal weakness or paresthesias are detected.         DATA:   ABIs on the right are 0.92 biphasic and on the left are 1.12 triphasic (performed one month ago)  Assessment/Plan:  24 year old female with history  of lupus as well as right lower extremity DVT that presents for one month follow-up for evaluation of her right shin wound.  Very pleased to see that her right shin wound has healed as pictured above.  We delayed intervention after initial evaluation since her mom felt this was making progress and she had a toe pressure that seemed potentially adequate for healing.  Very pleased with her progress.  Discussed she can follow-up as needed if she develops problems in the future.  No other leg symptoms like lifestyle and claudication or rest pain that would warrant intervention.  Marty Heck, MD [facility] Office: [phone]

## 2020-03-25 LAB — NUSWAB VAGINITIS PLUS (VG+)
Atopobium vaginae: HIGH Score — AB
Candida albicans, NAA: POSITIVE — AB
Candida glabrata, NAA: NEGATIVE
Chlamydia trachomatis, NAA: NEGATIVE
Neisseria gonorrhoeae, NAA: NEGATIVE
Trich vag by NAA: NEGATIVE

## 2020-05-04 ENCOUNTER — Ambulatory Visit: Payer: Self-pay | Admitting: Nurse Practitioner

## 2020-05-06 ENCOUNTER — Other Ambulatory Visit: Payer: Self-pay

## 2020-05-06 ENCOUNTER — Inpatient Hospital Stay: Payer: Medicaid Other

## 2020-05-06 ENCOUNTER — Inpatient Hospital Stay: Payer: Medicaid Other | Attending: Hematology & Oncology

## 2020-05-06 ENCOUNTER — Inpatient Hospital Stay: Payer: Medicaid Other | Admitting: Hematology & Oncology

## 2020-05-06 ENCOUNTER — Telehealth: Payer: Self-pay | Admitting: Family

## 2020-05-06 ENCOUNTER — Inpatient Hospital Stay (HOSPITAL_BASED_OUTPATIENT_CLINIC_OR_DEPARTMENT_OTHER): Payer: Self-pay | Admitting: Family

## 2020-05-06 VITALS — BP 126/76 | HR 107 | Temp 98.6°F | Resp 18 | Wt 152.4 lb

## 2020-05-06 DIAGNOSIS — I82411 Acute embolism and thrombosis of right femoral vein: Secondary | ICD-10-CM

## 2020-05-06 DIAGNOSIS — D500 Iron deficiency anemia secondary to blood loss (chronic): Secondary | ICD-10-CM

## 2020-05-06 DIAGNOSIS — Z7901 Long term (current) use of anticoagulants: Secondary | ICD-10-CM | POA: Insufficient documentation

## 2020-05-06 DIAGNOSIS — I7300 Raynaud's syndrome without gangrene: Secondary | ICD-10-CM | POA: Insufficient documentation

## 2020-05-06 DIAGNOSIS — R21 Rash and other nonspecific skin eruption: Secondary | ICD-10-CM | POA: Insufficient documentation

## 2020-05-06 DIAGNOSIS — I2699 Other pulmonary embolism without acute cor pulmonale: Secondary | ICD-10-CM | POA: Insufficient documentation

## 2020-05-06 DIAGNOSIS — E611 Iron deficiency: Secondary | ICD-10-CM | POA: Insufficient documentation

## 2020-05-06 DIAGNOSIS — M329 Systemic lupus erythematosus, unspecified: Secondary | ICD-10-CM | POA: Insufficient documentation

## 2020-05-06 DIAGNOSIS — F419 Anxiety disorder, unspecified: Secondary | ICD-10-CM | POA: Insufficient documentation

## 2020-05-06 DIAGNOSIS — I82401 Acute embolism and thrombosis of unspecified deep veins of right lower extremity: Secondary | ICD-10-CM | POA: Insufficient documentation

## 2020-05-06 DIAGNOSIS — R760 Raised antibody titer: Secondary | ICD-10-CM

## 2020-05-06 DIAGNOSIS — D6862 Lupus anticoagulant syndrome: Secondary | ICD-10-CM | POA: Insufficient documentation

## 2020-05-06 LAB — CMP (CANCER CENTER ONLY)
ALT: 11 U/L (ref 0–44)
AST: 22 U/L (ref 15–41)
Albumin: 3 g/dL — ABNORMAL LOW (ref 3.5–5.0)
Alkaline Phosphatase: 70 U/L (ref 38–126)
Anion gap: 6 (ref 5–15)
BUN: 12 mg/dL (ref 6–20)
CO2: 25 mmol/L (ref 22–32)
Calcium: 9 mg/dL (ref 8.9–10.3)
Chloride: 107 mmol/L (ref 98–111)
Creatinine: 0.81 mg/dL (ref 0.44–1.00)
GFR, Est AFR Am: >60 mL/min (ref >60)
GFR, Estimated: >60 mL/min (ref >60)
Glucose, Bld: 91 mg/dL (ref 70–99)
Potassium: 4 mmol/L (ref 3.5–5.1)
Sodium: 138 mmol/L (ref 135–145)
Total Bilirubin: 0.1 mg/dL — ABNORMAL LOW (ref 0.3–1.2)
Total Protein: 7.2 g/dL (ref 6.5–8.1)

## 2020-05-06 LAB — CBC WITH DIFFERENTIAL (CANCER CENTER ONLY)
Abs Immature Granulocytes: 0.02 10*3/uL (ref 0.00–0.07)
Basophils Absolute: 0 10*3/uL (ref 0.0–0.1)
Basophils Relative: 0 %
Eosinophils Absolute: 0 10*3/uL (ref 0.0–0.5)
Eosinophils Relative: 0 %
HCT: 33.3 % — ABNORMAL LOW (ref 36.0–46.0)
Hemoglobin: 10.5 g/dL — ABNORMAL LOW (ref 12.0–15.0)
Immature Granulocytes: 1 %
Lymphocytes Relative: 15 %
Lymphs Abs: 0.6 10*3/uL — ABNORMAL LOW (ref 0.7–4.0)
MCH: 24 pg — ABNORMAL LOW (ref 26.0–34.0)
MCHC: 31.5 g/dL (ref 30.0–36.0)
MCV: 76.2 fL — ABNORMAL LOW (ref 80.0–100.0)
Monocytes Absolute: 0.1 10*3/uL (ref 0.1–1.0)
Monocytes Relative: 3 %
Neutro Abs: 3.2 10*3/uL (ref 1.7–7.7)
Neutrophils Relative %: 81 %
Platelet Count: 293 10*3/uL (ref 150–400)
RBC: 4.37 MIL/uL (ref 3.87–5.11)
RDW: 16.5 % — ABNORMAL HIGH (ref 11.5–15.5)
WBC Count: 3.9 10*3/uL — ABNORMAL LOW (ref 4.0–10.5)
nRBC: 0 % (ref 0.0–0.2)

## 2020-05-06 NOTE — Progress Notes (Signed)
Hematology and Oncology Follow Up Visit  [name] [name] [id number] 06-01-1996 24 y.o. 05/06/2020   Principle Diagnosis:  DVT of the right lower extremity by [place] and VQ scan indeterminate of PE in August/September 2020 Lupus anticoagulant positive Iron deficiency  Current Therapy:        Xarelto 20 mg PO daily - Decreased to 10 mg PO daily today, 11/07/2019 Oral iron supplement daily    Interim History:  [name] [name] is here today with her mother for follow-up. She states that she went for a repeat US of the right leg earlier this year and it was negative. We will try top get this report from [place] [facility].   She is having a flare with her Lupus and is currently on a prednisone taper. She has a raised itchy rash across her back for the last year or so as well as joint stiffness, aches and pains. She states that the rash has now covered her back despite taking the steroid.  She is doing well on maintenance Xarelto and verbalized that she is taking as prescribed.  No episodes of bleeding. No bruising or petechiae.  She states that she has not had a cycle in 4 months. She is still discussing birth control options with her gynecologist but has not started anything yet.  She does note fatigue.  No fever, chills, cough, chest pain, palpitations, abdominal pain or changes in bowel or bladder habits.  She takes dulcolax when needed for constipation.  She has occasional vomiting secondary to GERD. She does take her Prilosec daily.  Due to the joint stiffness and discomfort, she ambulates with a walker for added support.  No falls or syncopal episodes to report.  She does have numbness and tingling in her fingertips and toes with Raynaud's. This is triggered by extreme temperatures when going from inside to outdoors.  She has anxiety and deals with panic attacks from time to time.  She states that she does not sleep well despite taking a sleep aid. She has been prescribed  Trazodone by her PCP and will let them know it does not help.  She does not have much of an appetite and only eats light meals. She does feel that she stays well hydrated. Her weight is stable at 152 lbs.   ECOG Performance Status: 1 - Symptomatic but completely ambulatory  Medications:  Allergies as of 05/06/2020      Reactions   Latex Rash      Medication List       Accurate as of May 06, 2020  3:53 PM. If you have any questions, ask your nurse or doctor.        amLODipine 10 MG tablet Commonly known as: NORVASC Take 1 tablet (10 mg total) by mouth daily.   buPROPion 300 MG 24 hr tablet Commonly known as: WELLBUTRIN XL TAKE 1 TABLET BY MOUTH EVERY DAY   ferrous sulfate 325 (65 FE) MG tablet Take 1 tablet (325 mg total) by mouth 2 (two) times daily. With 1/2 glass of orange juice   folic acid 1 MG tablet Commonly known as: FOLVITE Take 1 tablet (1 mg total) by mouth at bedtime.   gabapentin 300 MG capsule Commonly known as: NEURONTIN TAKE 1 CAPSULE BY MOUTH THREE TIMES A DAY   lisinopril 20 MG tablet Commonly known as: ZESTRIL Take 20 mg by mouth daily.   metroNIDAZOLE 500 MG tablet Commonly known as: FLAGYL Take 1 tablet (500 mg total) by  mouth 3 (three) times daily.   MULTIVITAMIN ADULT PO Take 1 tablet by mouth daily.   omeprazole 20 MG capsule Commonly known as: PRILOSEC Take 1 capsule (20 mg total) by mouth daily.   rivaroxaban 10 MG Tabs tablet Commonly known as: Xarelto Take 1 tablet (10 mg total) by mouth daily with supper.   tacrolimus 1 MG capsule Commonly known as: PROGRAF Take 1 mg by mouth 2 (two) times daily.   traZODone 100 MG tablet Commonly known as: DESYREL Take 1 tablet (100 mg total) by mouth at bedtime.   Vitamin D (Ergocalciferol) 1.25 MG (50000 UNIT) Caps capsule Commonly known as: DRISDOL Take 1 capsule (50,000 Units total) by mouth 2 (two) times a week.       Allergies:  Allergies  Allergen Reactions   Latex Rash     Past Medical History, Surgical history, Social history, and Family History were reviewed and updated.  Review of Systems: All other 10 point review of systems is negative.   Physical Exam:  weight is 152 lb 6.4 oz (69.1 kg). Her oral temperature is 98.6 F (37 C). Her blood pressure is 126/76 and her pulse is 107 (abnormal). Her respiration is 18 and oxygen saturation is 97%.   Wt Readings from Last 3 Encounters:  05/06/20 152 lb 6.4 oz (69.1 kg)  03/24/20 149 lb (67.6 kg)  03/23/20 153 lb (69.4 kg)    Ocular: Sclerae unicteric, pupils equal, round and reactive to light Ear-nose-throat: Oropharynx clear, dentition fair Lymphatic: No cervical or supraclavicular adenopathy Lungs no rales or rhonchi, good excursion bilaterally Heart regular rate and rhythm, no murmur appreciated Abd soft, nontender, positive bowel sounds MSK no focal spinal tenderness, no joint edema Neuro: non-focal, well-oriented, appropriate affect Breasts: Deferred   Lab Results  Component Value Date   WBC 3.9 (L) 05/06/2020   HGB 10.5 (L) 05/06/2020   HCT 33.3 (L) 05/06/2020   MCV 76.2 (L) 05/06/2020   PLT 293 05/06/2020   Lab Results  Component Value Date   FERRITIN 55 12/03/2019   IRON 28 12/03/2019   TIBC 271 12/03/2019   UIBC 243 12/03/2019   IRONPCTSAT 10 (L) 12/03/2019   Lab Results  Component Value Date   RETICCTPCT 1.5 12/03/2019   RBC 4.37 05/06/2020   No results found for: KPAFRELGTCHN, LAMBDASER, KAPLAMBRATIO No results found for: IGGSERUM, IGA, IGMSERUM No results found for: [name] [name], SPEI   Chemistry      Component Value Date/Time   NA 138 05/06/2020 1407   NA 140 12/03/2019 1449   K 4.0 05/06/2020 1407   CL 107 05/06/2020 1407   CO2 25 05/06/2020 1407   BUN 12 05/06/2020 1407   BUN 11 12/03/2019 1449   CREATININE 0.81 05/06/2020 1407   CREATININE 0.57 12/11/2017 1018      Component Value Date/Time   CALCIUM  9.0 05/06/2020 1407   ALKPHOS 70 05/06/2020 1407   AST 22 05/06/2020 1407   ALT 11 05/06/2020 1407   BILITOT 0.1 (L) 05/06/2020 1407       Impression and Plan: [name] [name] is a very pleasant 24 yo African American female with history of systemic lupus erythematosus and Raynaud's. She was diagnosed with right lower extremity DVT and VQ scan was indeterminate for PE back in August/September 2020.  She was positive for the lupus anticoagulant and had a mildly elevated anticardiolipin IgM antibody.  She is doing well on maintenance Xarelto 10 mg PO daily. She  will continue her same regimen.  Her iron saturation is only 9% and ferritin 8. We will get her set up for IV iron. Our financial counselor is sending her the forms to fill out to qualify for free drug.  I routed today's note to Dr. [name] with an attached note letting them know the rash on her back seems a little worse despite her being on the prednisone.  We will plan to see her again in 6 months.  They can contact our office with any questions or concerns.   Laverna Peace, NP 8/25/20213:53 PM

## 2020-05-06 NOTE — Telephone Encounter (Signed)
Appointments scheduled patient ot get update from My Chart per 8/25 los

## 2020-05-07 ENCOUNTER — Encounter: Payer: Self-pay | Admitting: *Deleted

## 2020-05-07 LAB — IRON AND TIBC
Iron: 26 ug/dL — ABNORMAL LOW (ref 41–142)
Saturation Ratios: 9 % — ABNORMAL LOW (ref 21–57)
TIBC: 291 ug/dL (ref 236–444)
UIBC: 266 ug/dL (ref 120–384)

## 2020-05-07 LAB — FERRITIN: Ferritin: 8 ng/mL — ABNORMAL LOW (ref 11–307)

## 2020-05-08 LAB — CARDIOLIPIN ANTIBODIES, IGG, IGM, IGA
Anticardiolipin IgA: <9 APL U/mL (ref 0–11)
Anticardiolipin IgG: <9 GPL U/mL (ref 0–14)
Anticardiolipin IgM: 49 MPL U/mL — ABNORMAL HIGH (ref 0–12)

## 2020-05-08 LAB — LUPUS ANTICOAGULANT PANEL
DRVVT: 34.9 s (ref 0.0–47.0)
PTT Lupus Anticoagulant: 35.5 s (ref 0.0–51.9)

## 2020-05-21 ENCOUNTER — Encounter: Payer: Self-pay | Admitting: Pharmacy Technician

## 2020-05-21 NOTE — Progress Notes (Signed)
Patient has been approved for drug assistance by [employer] for Feraheme. The enrollment period is from 05/14/20-05/14/21 based on self pay. First DOS covered is TBA.

## 2020-05-27 ENCOUNTER — Ambulatory Visit (INDEPENDENT_AMBULATORY_CARE_PROVIDER_SITE_OTHER): Payer: Medicaid Other | Admitting: Nurse Practitioner

## 2020-05-27 ENCOUNTER — Other Ambulatory Visit: Payer: Self-pay

## 2020-05-27 ENCOUNTER — Ambulatory Visit (INDEPENDENT_AMBULATORY_CARE_PROVIDER_SITE_OTHER): Payer: No Payment, Other | Admitting: Psychiatry

## 2020-05-27 ENCOUNTER — Encounter (HOSPITAL_COMMUNITY): Payer: Self-pay | Admitting: Psychiatry

## 2020-05-27 DIAGNOSIS — F419 Anxiety disorder, unspecified: Secondary | ICD-10-CM

## 2020-05-27 DIAGNOSIS — F0632 Mood disorder due to known physiological condition with major depressive-like episode: Secondary | ICD-10-CM

## 2020-05-27 DIAGNOSIS — Z3042 Encounter for surveillance of injectable contraceptive: Secondary | ICD-10-CM

## 2020-05-27 DIAGNOSIS — Z3202 Encounter for pregnancy test, result negative: Secondary | ICD-10-CM | POA: Diagnosis not present

## 2020-05-27 LAB — POCT URINE PREGNANCY: Preg Test, Ur: NEGATIVE

## 2020-05-27 MED ORDER — MIRTAZAPINE 7.5 MG PO TABS: ORAL_TABLET | ORAL | 1 refills | Status: DC

## 2020-05-27 MED ORDER — LORAZEPAM 0.5 MG PO TABS: 0.5000 mg | ORAL_TABLET | Freq: Two times a day (BID) | ORAL | 2 refills | Status: DC | PRN

## 2020-05-27 NOTE — Progress Notes (Signed)
Patient requested pregnancy test. Last day of intercourse was 05-20-2020.  Patient to schedule lab appointment to come back and have labs drawn to check pregnancy levels

## 2020-05-27 NOTE — Progress Notes (Signed)
Psychiatric Initial Adult Assessment   Patient Identification: [name] [name] MRN:  [id number] Date of Evaluation:  05/27/2020   Referral Source: [facility]  Chief Complaint:   " I have no energy and I don't sleep at night."  Visit Diagnosis:    ICD-10-CM   1. 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 Anxiety  F41.9 mirtazapine (REMERON) 7.5 MG tablet    LORazepam (ATIVAN) 0.5 MG tablet    History of Present Illness: This is a 24 year old female with history of systemic lupus erythematosus, Raynaud's disease, hypertension, anemia of chronic disease, gout pulmonary embolism and lower extremity DVT and depression now seen for evaluation and establishing care. Patient reported that she was diagnosed with lupus at the age of 22 and started developing severe symptoms around age 68.  She is now using a cane for ambulation and is dealing with a lot of physical conditions secondary to lupus.  She has an established rheumatologist and other specialists involved in her care.  Patient reported that she was prescribed Wellbutrin a few months ago however she stopped taking it after a month has been off of it for about 3 months now.  She did not find it to be helpful.  She was also prescribed trazodone and she does not think that helps her much with sleep.  She informed that she feels sad and depressed most of the times.  She does not have the energy to do anything and she prefers to stay in her bed either taking a nap or just laying there during the daytime.  She informed that her older sister lives with her and helps her with her basic needs.  She stated that her sister is the one who fixes breakfast and lunch for her while she is at work.  Patient has to wait for her sister to get back from work so that they can have dinner together.  She stated that she generally does not eat much for breakfast but will take  only a few bites so that she can take all her medicines.  She does not feel like having a lunch but she will try to eat some.  She reported she feels hopeless however she does not have any intent to end her life by any means.  She stated that she knows that she has a very serious illness and she is very to wait for whenever it is her time to die.  She denied any past suicidal ideations or prior suicide attempts. In terms of support, patient feels well supported by her older sister who she lives with.  She also informed that she has another older sister who recently had a baby about couple of months ago and is married.  She stated that she tries to maintain her boundaries with her sisters and does not disturb them too much so that they can maintain their own lives. She informed that her father works a lot and is not able to see her during the weekdays.  He tries to come every weekend to spend time with her and tries to cheer her up.  She stated that her father does not have any transportation and therefore if they have to step out they have to rely on public transportation which makes things harder for her. She expressed some frustration due to the relationship she has with her mother.  She stated that her mother has ongoing substance abuse  issues and she currently lives with her mother (patient's grandmother) 10 minutes away from where she lives.  She stated that her mother chooses to spend all her money on drugs and as result does not have transportation.  As result of this patient does not get to see her mother as much as she would like to she also mentioned that her grandmother chooses to enable her maternal half brother who also has substance abuse issues and patient does not agree with all that.  She stated that she is trying to live with all the physical limitation she has due to her illness.  She stated that she is ready for the possible future outcomes including getting limited to a  wheelchair.  Patient reported that sometimes she feels very anxious and has episodes of anxiety.  She stated that sometimes when she goes up with her sister for grocery shopping or any other place when she feels her heart pounding and feels as if she is going to pass out as she can feel all her energy draining down her feet.  She stated that her sister finds this a little disturbing and has asked her to seek help regarding this.  She denies any symptom suggestive of mania or hypomania.  She denied any psychotic symptoms.  Patient informed that she has taken Prozac in the past but did not find it to be too helpful.  She may have taken Lexapro but is not too sure.  She did not find Wellbutrin to be helpful at all.  She has never taken any other sleeping pills other than trazodone.  She has never taken anything else for anxiety.  She denied any use of illicit substances or excessive consumption of alcohol.  Past Psychiatric History: MDD, anxiety, panic episodes  Previous Psychotropic Medications: Yes  , Prozac, Wellbutrin, may be Lexapro   Substance Abuse History in the last 12 months:  No.  Consequences of Substance Abuse: Negative NA  Past Medical History:  Past Medical History:  Diagnosis Date  . Accidental methotrexate overdose 01/08/2018  . Acute lower UTI   . Bronchitis 03/19/2019  . Cardiomegaly 05/08/2019  . Chronic midline low back pain without sciatica 11/09/2016   Facet joint arthropathy  . Dehydration 03/19/2019  . Depo-Provera contraceptive status 08/11/2015  . Depression 02/08/2017  . Dysphagia 06/01/2018  . Dysuria 04/19/2019  . Elevated AST (SGOT) 03/19/2019  . Essential hypertension 03/19/2019  . Facial edema   . Gout   . Hypertension   . IDA (iron deficiency anemia) 08/11/2015  . Immunosuppressed status ([place])   . Insomnia 01/29/2016  . Leukopenia 01/10/2018  . Lobar pneumonia ([place]) 06/07/2018  . Lupus ([place])   . Lupus (systemic lupus erythematosus) ([place]) 03/19/2019  .  Metabolic acidosis 3/0/8657  . Methotrexate toxicity   . Mouth ulcers   . Mucositis oral 01/12/2018  . Odynophagia 06/01/2018  . Oral thrush 03/19/2019  . Pleuritic chest pain 06/05/2018  . Primary osteoarthritis of both knees 11/09/2016  . Rash due to systemic lupus erythematosus (SLE) ([place])   . Raynaud disease   . Raynaud's disease 08/11/2015  . Relative acute adrenal insufficiency ([place]) 06/11/2018  . Right leg DVT ([place]) 05/08/2019  . Seasonal allergies 08/11/2015  . Sepsis ([place]) 05/08/2019  . Sinus tachycardia 03/19/2019  . SLE (systemic lupus erythematosus) ([place]) 09/19/2016     Positive ANA, double-stranded DNA, Ro, RNP, RF, history of nasal ulcers, fatigue, anemia, arthritis, Raynauds  . Thrush 06/01/2018  . Vaginal candidiasis 01/12/2018  .  Vitamin D deficiency 11/09/2016    Past Surgical History:  Procedure Laterality Date  . ORIF SHOULDER FRACTURE Left   . TONSILLECTOMY      Family Psychiatric History: Mother-substance abuse issues  Family History:  Family History  Problem Relation Age of Onset  . Aneurysm Mother   . Stroke Sister   . Seizures Sister   . Lung cancer Maternal Aunt   . Heart attack Paternal Uncle   . Colon cancer Neg Hx   . Esophageal cancer Neg Hx   . Pancreatic cancer Neg Hx   . Stomach cancer Neg Hx     Social History:   Social History   Socioeconomic History  . Marital status: Single    Spouse name: Not on file  . Number of children: Not on file  . Years of education: Not on file  . Highest education level: Not on file  Occupational History  . Not on file  Tobacco Use  . Smoking status: Never Smoker  . Smokeless tobacco: Never Used  Vaping Use  . Vaping Use: Never used  Substance and Sexual Activity  . Alcohol use: No  . Drug use: No  . Sexual activity: Not Currently  Other Topics Concern  . Not on file  Social History Narrative  . Not on file   Social Determinants of Health   Financial Resource Strain:   . Difficulty of Paying Living  Expenses: Not on file  Food Insecurity:   . Worried About Charity fundraiser in the Last Year: Not on file  . Ran Out of Food in the Last Year: Not on file  Transportation Needs:   . Lack of Transportation (Medical): Not on file  . Lack of Transportation (Non-Medical): Not on file  Physical Activity:   . Days of Exercise per Week: Not on file  . Minutes of Exercise per Session: Not on file  Stress:   . Feeling of Stress : Not on file  Social Connections:   . Frequency of Communication with Friends and Family: Not on file  . Frequency of Social Gatherings with Friends and Family: Not on file  . Attends Religious Services: Not on file  . Active Member of Clubs or Organizations: Not on file  . Attends Archivist Meetings: Not on file  . Marital Status: Not on file    Additional Social History: Lives with sister  Allergies:   Allergies  Allergen Reactions  . Latex Rash    Metabolic Disorder Labs: Lab Results  Component Value Date   HGBA1C 4.5 (L) 03/20/2019   MPG 82.45 03/20/2019   No results found for: PROLACTIN No results found for: CHOL, TRIG, HDL, CHOLHDL, VLDL, LDLCALC Lab Results  Component Value Date   TSH 1.180 12/07/2019    Therapeutic Level Labs: No results found for: LITHIUM No results found for: CBMZ No results found for: VALPROATE  Current Medications: Current Outpatient Medications  Medication Sig Dispense Refill  . amLODipine (NORVASC) 10 MG tablet Take 1 tablet (10 mg total) by mouth daily. 30 tablet 0  . ferrous sulfate 325 (65 FE) MG tablet Take 1 tablet (325 mg total) by mouth 2 (two) times daily. With 1/2 glass of orange juice 30 tablet 0  . folic acid (FOLVITE) 1 MG tablet Take 1 tablet (1 mg total) by mouth at bedtime. 30 tablet 0  . gabapentin (NEURONTIN) 300 MG capsule TAKE 1 CAPSULE BY MOUTH THREE TIMES A DAY 90 capsule 3  . lisinopril (ZESTRIL)  20 MG tablet Take 20 mg by mouth daily. (Patient not taking: Reported on 03/24/2020)     . LORazepam (ATIVAN) 0.5 MG tablet Take 1 tablet (0.5 mg total) by mouth 2 (two) times daily as needed for anxiety. 20 tablet 2  . metroNIDAZOLE (FLAGYL) 500 MG tablet Take 1 tablet (500 mg total) by mouth 3 (three) times daily. 21 tablet 0  . mirtazapine (REMERON) 7.5 MG tablet Take one tablet at bedtime, increase to 2 tablets at bedtime after 2 weeks. 90 tablet 1  . Multiple Vitamins-Minerals (MULTIVITAMIN ADULT PO) Take 1 tablet by mouth daily.    Marland Kitchen omeprazole (PRILOSEC) 20 MG capsule Take 1 capsule (20 mg total) by mouth daily. 30 capsule 3  . rivaroxaban (XARELTO) 10 MG TABS tablet Take 1 tablet (10 mg total) by mouth daily with supper. 30 tablet 6  . tacrolimus (PROGRAF) 1 MG capsule Take 1 mg by mouth 2 (two) times daily.    . Vitamin D, Ergocalciferol, (DRISDOL) 50000 units CAPS capsule Take 1 capsule (50,000 Units total) by mouth 2 (two) times a week. 24 capsule 0   No current facility-administered medications for this visit.    Musculoskeletal: Strength & Muscle Tone: within normal limits Gait & Station: normal Patient leans: N/A  Psychiatric Specialty Exam: Review of Systems  There were no vitals taken for this visit.There is no height or weight on file to calculate BMI.  General Appearance: Fairly Groomed  Eye Contact:  Fair  Speech:  Clear and Coherent and Slow  Volume:  Decreased  Mood:  Depressed and Dysphoric  Affect:  Congruent  Thought Process:  Goal Directed and Descriptions of Associations: Intact  Orientation:  Full (Time, Place, and Person)  Thought Content:  Logical  Suicidal Thoughts:  No  Homicidal Thoughts:  No  Memory:  Immediate;   Good Recent;   Good Remote;   Good  Judgement:  Fair  Insight:  Fair  Psychomotor Activity:  Decreased  Concentration:  Concentration: Good and Attention Span: Good  Recall:  Good  Fund of Knowledge:Good  Language: Good  Akathisia:  Negative  Handed:  Right  AIMS (if indicated):  Not done  Assets:  Communication  Skills Desire for Improvement Financial Resources/Insurance Housing  ADL's:  Intact  Cognition: WNL  Sleep:  Poor   Screenings: PHQ2-9     Office Visit from 03/23/2020 in [facility] Office Visit from 01/28/2020 in [facility] Office Visit from 12/11/2019 in [facility] Office Visit from 12/03/2019 in [facility] Office Visit from 05/08/2019 in [facility]  PHQ-2 Total Score 3 0 1 1 0  PHQ-9 Total Score 16 -- -- -- --      Assessment and Plan: Patient appears to be quite depressed due to her ongoing medical conditions.  She has been off of Wellbutrin for about 3 months now.  She is willing to try mirtazapine to address her depressive symptoms including poor sleep and poor appetite.  We will start with 7.5 mg at bedtime, patient was advised that she can increase the dose to 2 tablets at bedtime after 2 weeks. It was brought to the patient's attention that since she is spending too much time in bed during the daytime that may be impacting her ability to sleep at night. Was also prescribed lorazepam 0.5 mg as needed for breakthrough anxiety or panic episodes.  1. Depressive disorder due to another medical condition  with major depressive-like episode  - mirtazapine (REMERON) 7.5 MG tablet; Take one tablet at bedtime, increase to 2 tablets at bedtime after 2 weeks.  Dispense: 90 tablet; Refill: 1  2. Anxiety - mirtazapine (REMERON) 7.5 MG tablet; Take one tablet at bedtime, increase to 2 tablets at bedtime after 2 weeks.  Dispense: 90 tablet; Refill: 1 - LORazepam (ATIVAN) 0.5 MG tablet; Take 1 tablet (0.5 mg total) by mouth 2 (two) times daily as needed for anxiety.  Dispense: 20 tablet; Refill: 2  Supportive therapy provided during the session.  F/up in 2 months.  Nevada Crane, MD 9/15/20219:35 AM

## 2020-05-28 ENCOUNTER — Other Ambulatory Visit (HOSPITAL_COMMUNITY): Payer: Self-pay | Admitting: *Deleted

## 2020-05-28 DIAGNOSIS — F0632 Mood disorder due to known physiological condition with major depressive-like episode: Secondary | ICD-10-CM

## 2020-05-28 DIAGNOSIS — F419 Anxiety disorder, unspecified: Secondary | ICD-10-CM

## 2020-05-28 MED ORDER — MIRTAZAPINE 7.5 MG PO TABS: ORAL_TABLET | ORAL | 1 refills | Status: DC

## 2020-05-28 MED ORDER — LORAZEPAM 0.5 MG PO TABS: 0.5000 mg | ORAL_TABLET | Freq: Two times a day (BID) | ORAL | 2 refills | Status: DC | PRN

## 2020-06-15 ENCOUNTER — Other Ambulatory Visit (HOSPITAL_COMMUNITY): Payer: Self-pay | Admitting: Psychiatry

## 2020-06-15 DIAGNOSIS — F0632 Mood disorder due to known physiological condition with major depressive-like episode: Secondary | ICD-10-CM

## 2020-06-15 DIAGNOSIS — F419 Anxiety disorder, unspecified: Secondary | ICD-10-CM

## 2020-06-24 ENCOUNTER — Encounter: Payer: Self-pay | Admitting: Nurse Practitioner

## 2020-06-24 ENCOUNTER — Ambulatory Visit (INDEPENDENT_AMBULATORY_CARE_PROVIDER_SITE_OTHER): Payer: Self-pay | Admitting: Nurse Practitioner

## 2020-06-24 ENCOUNTER — Other Ambulatory Visit: Payer: Self-pay

## 2020-06-24 VITALS — BP 132/89 | HR 100 | Temp 97.9°F | Ht 63.0 in | Wt 151.0 lb

## 2020-06-24 DIAGNOSIS — F321 Major depressive disorder, single episode, moderate: Secondary | ICD-10-CM

## 2020-06-24 DIAGNOSIS — M329 Systemic lupus erythematosus, unspecified: Secondary | ICD-10-CM

## 2020-06-24 DIAGNOSIS — D638 Anemia in other chronic diseases classified elsewhere: Secondary | ICD-10-CM

## 2020-06-24 NOTE — Progress Notes (Signed)
Established Patient Office Visit  Subjective:  Patient ID: [name] [name], female    DOB: [birth year]  Age: 24 y.o. MRN: [id number]  CC:  Chief Complaint  Patient presents with   Follow-up    having bilaterel leg pain due to lupus    HPI [name] [name] presents for follow up.  has a past medical history of Accidental methotrexate overdose (01/08/2018), Acute lower UTI, Bronchitis (03/19/2019), Cardiomegaly (05/08/2019), Chronic midline low back pain without sciatica (11/09/2016), Dehydration (03/19/2019), Depo-Provera contraceptive status (08/11/2015), Depression (02/08/2017), Dysphagia (06/01/2018), Dysuria (04/19/2019), Elevated AST (SGOT) (03/19/2019), Essential hypertension (03/19/2019), Facial edema, Gout, Hypertension, IDA (iron deficiency anemia) (08/11/2015), Immunosuppressed status ([place]), Insomnia (01/29/2016), Leukopenia (01/10/2018), Lobar pneumonia ([place]) (06/07/2018), Lupus ([place]), Lupus (systemic lupus erythematosus) ([place]) (11/14/3612), Metabolic acidosis (12/14/1538), Methotrexate toxicity, Mouth ulcers, Mucositis oral (01/12/2018), Odynophagia (06/01/2018), Oral thrush (03/19/2019), Pleuritic chest pain (06/05/2018), Primary osteoarthritis of both knees (11/09/2016), Rash due to systemic lupus erythematosus (SLE) ([place]), Raynaud disease, Raynaud's disease (08/11/2015), Relative acute adrenal insufficiency ([place]) (06/11/2018), Right leg DVT ([place]) (05/08/2019), Seasonal allergies (08/11/2015), Sepsis ([place]) (05/08/2019), Sinus tachycardia (03/19/2019), SLE (systemic lupus erythematosus) ([place]) (09/19/2016), Thrush (06/01/2018), Vaginal candidiasis (01/12/2018), and Vitamin D deficiency (11/09/2016).   She is in today following. She is monitor closely for her lupus, depression, cardiomyopathy by specialist at [facility].  She denies any recent changes in her regimen.Denies headache, dizziness, visual changes, shortness of breath, dyspnea on exertion, chest pain, nausea or vomiting.  She admits that her depression is stable.  She  denies any suicidal thoughts or ideations.  She currently lives with her sister.  Her parents are close by and involved in her care.  She has been recently denied Medicaid and disability.  She is undergoing appeal.   Past Medical History:  Diagnosis Date   Accidental methotrexate overdose 01/08/2018   Acute lower UTI    Bronchitis 03/19/2019   Cardiomegaly 05/08/2019   Chronic midline low back pain without sciatica 11/09/2016   Facet joint arthropathy   Dehydration 03/19/2019   Depo-Provera contraceptive status 08/11/2015   Depression 02/08/2017   Dysphagia 06/01/2018   Dysuria 04/19/2019   Elevated AST (SGOT) 03/19/2019   Essential hypertension 03/19/2019   Facial edema    Gout    Hypertension    IDA (iron deficiency anemia) 08/11/2015   Immunosuppressed status ([place])    Insomnia 01/29/2016   Leukopenia 01/10/2018   Lobar pneumonia ([place]) 06/07/2018   Lupus ([place])    Lupus (systemic lupus erythematosus) ([place]) 0/04/6760   Metabolic acidosis 05/18/931   Methotrexate toxicity    Mouth ulcers    Mucositis oral 01/12/2018   Odynophagia 06/01/2018   Oral thrush 03/19/2019   Pleuritic chest pain 06/05/2018   Primary osteoarthritis of both knees 11/09/2016   Rash due to systemic lupus erythematosus (SLE) ([place])    Raynaud disease    Raynaud's disease 08/11/2015   Relative acute adrenal insufficiency ([place]) 06/11/2018   Right leg DVT ([place]) 05/08/2019   Seasonal allergies 08/11/2015   Sepsis ([place]) 05/08/2019   Sinus tachycardia 03/19/2019   SLE (systemic lupus erythematosus) ([place]) 09/19/2016     Positive ANA, double-stranded DNA, Ro, RNP, RF, history of nasal ulcers, fatigue, anemia, arthritis, Raynauds   Thrush 06/01/2018   Vaginal candidiasis 01/12/2018   Vitamin D deficiency 11/09/2016    Past Surgical History:  Procedure Laterality Date   ORIF SHOULDER FRACTURE Left    TONSILLECTOMY      Family History  Problem Relation Age of Onset   Aneurysm  Mother    Stroke  Sister    Seizures Sister    Lung cancer Maternal Aunt    Heart attack Paternal Uncle    Colon cancer Neg Hx    Esophageal cancer Neg Hx    Pancreatic cancer Neg Hx    Stomach cancer Neg Hx     Social History   Socioeconomic History   Marital status: Single    Spouse name: Not on file   Number of children: Not on file   Years of education: Not on file   Highest education level: Not on file  Occupational History   Not on file  Tobacco Use   Smoking status: Never Smoker   Smokeless tobacco: Never Used  Vaping Use   Vaping Use: Never used  Substance and Sexual Activity   Alcohol use: No   Drug use: No   Sexual activity: Not Currently  Other Topics Concern   Not on file  Social History Narrative   Not on file   Social Determinants of Health   Financial Resource Strain:    Difficulty of Paying Living Expenses: Not on file  Food Insecurity:    Worried About Iron in the Last Year: Not on file   Ran Out of Food in the Last Year: Not on file  Transportation Needs:    Lack of Transportation (Medical): Not on file   Lack of Transportation (Non-Medical): Not on file  Physical Activity:    Days of Exercise per Week: Not on file   Minutes of Exercise per Session: Not on file  Stress:    Feeling of Stress : Not on file  Social Connections:    Frequency of Communication with Friends and Family: Not on file   Frequency of Social Gatherings with Friends and Family: Not on file   Attends Religious Services: Not on file   Active Member of Clubs or Organizations: Not on file   Attends Archivist Meetings: Not on file   Marital Status: Not on file  Intimate Partner Violence:    Fear of Current or Ex-Partner: Not on file   Emotionally Abused: Not on file   Physically Abused: Not on file   Sexually Abused: Not on file    Outpatient Medications Prior to Visit  Medication Sig Dispense Refill   amLODipine (NORVASC)  10 MG tablet Take 1 tablet (10 mg total) by mouth daily. 30 tablet 0   ferrous sulfate 325 (65 FE) MG tablet Take 1 tablet (325 mg total) by mouth 2 (two) times daily. With 1/2 glass of orange juice 30 tablet 0   folic acid (FOLVITE) 1 MG tablet Take 1 tablet (1 mg total) by mouth at bedtime. 30 tablet 0   gabapentin (NEURONTIN) 300 MG capsule TAKE 1 CAPSULE BY MOUTH THREE TIMES A DAY 90 capsule 3   hydroxychloroquine (PLAQUENIL) 200 MG tablet Take by mouth daily.     lisinopril (ZESTRIL) 20 MG tablet Take 20 mg by mouth daily.      mirtazapine (REMERON) 7.5 MG tablet TAKE ONE TABLET AT BEDTIME, INCREASE TO 2 TABLETS AT BEDTIME AFTER 2 WEEKS. 180 tablet 1   Multiple Vitamins-Minerals (MULTIVITAMIN ADULT PO) Take 1 tablet by mouth daily.     omeprazole (PRILOSEC) 20 MG capsule Take 1 capsule (20 mg total) by mouth daily. 30 capsule 3   Vitamin D, Ergocalciferol, (DRISDOL) 50000 units CAPS capsule Take 1 capsule (50,000 Units total) by mouth 2 (two) times a week. 24 capsule  0   LORazepam (ATIVAN) 0.5 MG tablet Take 1 tablet (0.5 mg total) by mouth 2 (two) times daily as needed for anxiety. (Patient not taking: Reported on 06/24/2020) 20 tablet 2   rivaroxaban (XARELTO) 10 MG TABS tablet Take 1 tablet (10 mg total) by mouth daily with supper. (Patient not taking: Reported on 06/24/2020) 30 tablet 6   tacrolimus (PROGRAF) 1 MG capsule Take 1 mg by mouth 2 (two) times daily. (Patient not taking: Reported on 06/24/2020)     metroNIDAZOLE (FLAGYL) 500 MG tablet Take 1 tablet (500 mg total) by mouth 3 (three) times daily. 21 tablet 0   No facility-administered medications prior to visit.    Allergies  Allergen Reactions   Latex Rash    ROS Review of Systems  Eyes: Positive for visual disturbance.  Respiratory: Negative for shortness of breath.   Cardiovascular: Positive for chest pain (on exertion but generally not a problem ) and leg swelling.  Gastrointestinal: Negative.    Endocrine: Negative.   Genitourinary: Negative.   Musculoskeletal: Negative.   Skin: Negative.   Neurological: Positive for dizziness (related to her medication) and light-headedness (with increased anxiety; she sits down and rest and this improves). Negative for headaches.  Hematological: Negative.   Psychiatric/Behavioral: Negative.       Objective:    Physical Exam Constitutional:      Appearance: She is normal weight.  HENT:     Head: Normocephalic and atraumatic.     Nose: Nose normal.  Cardiovascular:     Rate and Rhythm: Normal rate and regular rhythm.     Pulses: Normal pulses.     Heart sounds: Normal heart sounds.  Pulmonary:     Effort: Pulmonary effort is normal.     Breath sounds: Normal breath sounds.  Abdominal:     Palpations: Abdomen is soft.  Musculoskeletal:     Cervical back: Normal range of motion.     Right lower leg: No edema.     Left lower leg: No edema.     Comments: Alternation in gait   Skin:    Findings: Rash present.  Neurological:     Mental Status: She is alert.     BP 132/89 (BP Location: Right Arm, Patient Position: Sitting, Cuff Size: Normal)    Pulse 100    Temp 97.9 F (36.6 C) (Temporal)    Ht 5\' 3"  (1.6 m)    Wt 151 lb (68.5 kg)    SpO2 100%    BMI 26.75 kg/m  Wt Readings from Last 3 Encounters:  06/24/20 151 lb (68.5 kg)  05/06/20 152 lb 6.4 oz (69.1 kg)  03/24/20 149 lb (67.6 kg)     There are no preventive care reminders to display for this patient.  There are no preventive care reminders to display for this patient.  Lab Results  Component Value Date   TSH 1.180 12/07/2019   Lab Results  Component Value Date   WBC 3.9 (L) 05/06/2020   HGB 10.5 (L) 05/06/2020   HCT 33.3 (L) 05/06/2020   MCV 76.2 (L) 05/06/2020   PLT 293 05/06/2020   Lab Results  Component Value Date   NA 138 05/06/2020   K 4.0 05/06/2020   CO2 25 05/06/2020   GLUCOSE 91 05/06/2020   BUN 12 05/06/2020   CREATININE 0.81 05/06/2020    BILITOT 0.1 (L) 05/06/2020   ALKPHOS 70 05/06/2020   AST 22 05/06/2020   ALT 11 05/06/2020   PROT 7.2 05/06/2020  ALBUMIN 3.0 (L) 05/06/2020   CALCIUM 9.0 05/06/2020   ANIONGAP 6 05/06/2020   No results found for: CHOL No results found for: HDL No results found for: LDLCALC No results found for: TRIG No results found for: CHOLHDL Lab Results  Component Value Date   HGBA1C 4.5 (L) 03/20/2019      Assessment & Plan:   Problem List Items Addressed This Visit      Other   Depression Continue on current regimen   SLE (systemic lupus erythematosus) ([place]) (Chronic) Continue to follow with specialist    Other Visit Diagnoses    Anemia of chronic disease    -  Primary Encourage patient to make sure that she is taking her folic acid and supplements as directed If she noticed any abnormal bleeding to follow-up.      No orders of the defined types were placed in this encounter.   Follow-up: Return in about 6 months (around 12/23/2020).    Vevelyn Francois, NP

## 2020-06-24 NOTE — Progress Notes (Signed)
Integrated Behavioral Health Case Management Referral Note  06/24/2020 Name: [name] [name] MRN: [id number] DOB: 1995/10/16 [name] [name] is a 24 y.o. year old female who sees Default, Provider, MD for primary care. LCSW was consulted to assess patient's needs and assist the patient with Financial Difficulties related to lack of income and no inadequate health coverage.  Interpreter: No.   Interpreter Name & Language: none  Assessment: Patient experiencing Financial constraints related to lack of income and inadequate health coverage. Patient in need of eye exam. Patient only has family planning Medicaid. She has had an [employer] in the past. She does not currently have income.   Intervention: Provided patient with [employer] and [employer] applications and reviewed necessary documents that will need to be submitted with application. Advised that patient may call CSW with questions on the application and for assistance in scheduling with financial counselor at [facility]) to submit the application. Once approved for [facility], patient may be referred through [facility] for eye exam.  Review of patient status, including review of consultants reports, relevant laboratory and other test results, and collaboration with appropriate care team members and the patient's provider was performed as part of comprehensive patient evaluation and provision of services.    SDOH (Social Determinants of Health) assessments performed: No SDOH Interventions     Most Recent Value  SDOH Interventions  Depression Interventions/Treatment  Medication       Outpatient Encounter Medications as of 06/24/2020  Medication Sig Note   amLODipine (NORVASC) 10 MG tablet Take 1 tablet (10 mg total) by mouth daily.    ferrous sulfate 325 (65 FE) MG tablet Take 1 tablet (325 mg total) by mouth 2 (two) times daily. With 1/2 glass of orange juice    folic acid (FOLVITE) 1 MG tablet Take 1 tablet (1 mg  total) by mouth at bedtime.    gabapentin (NEURONTIN) 300 MG capsule TAKE 1 CAPSULE BY MOUTH THREE TIMES A DAY    hydroxychloroquine (PLAQUENIL) 200 MG tablet Take by mouth daily.    lisinopril (ZESTRIL) 20 MG tablet Take 20 mg by mouth daily.     mirtazapine (REMERON) 7.5 MG tablet TAKE ONE TABLET AT BEDTIME, INCREASE TO 2 TABLETS AT BEDTIME AFTER 2 WEEKS.    Multiple Vitamins-Minerals (MULTIVITAMIN ADULT PO) Take 1 tablet by mouth daily.    omeprazole (PRILOSEC) 20 MG capsule Take 1 capsule (20 mg total) by mouth daily.    Vitamin D, Ergocalciferol, (DRISDOL) 50000 units CAPS capsule Take 1 capsule (50,000 Units total) by mouth 2 (two) times a week.    LORazepam (ATIVAN) 0.5 MG tablet Take 1 tablet (0.5 mg total) by mouth 2 (two) times daily as needed for anxiety. (Patient not taking: Reported on 06/24/2020)    rivaroxaban (XARELTO) 10 MG TABS tablet Take 1 tablet (10 mg total) by mouth daily with supper. (Patient not taking: Reported on 06/24/2020) 12/09/2019: Given at hospital= unk time   tacrolimus (PROGRAF) 1 MG capsule Take 1 mg by mouth 2 (two) times daily. (Patient not taking: Reported on 06/24/2020)    [DISCONTINUED] metroNIDAZOLE (FLAGYL) 500 MG tablet Take 1 tablet (500 mg total) by mouth 3 (three) times daily.    No facility-administered encounter medications on file as of 06/24/2020.    Goals Addressed   None      Follow up Plan: 1.Patient to complete [employer] and [employer] applications and follow up for assistance as needed. 2. CSW available to assist in  scheduling with financial counselor to submit applications.  Estanislado Emms, [facility] [phone]

## 2020-07-27 ENCOUNTER — Encounter (HOSPITAL_COMMUNITY): Payer: Self-pay | Admitting: Psychiatry

## 2020-07-27 ENCOUNTER — Ambulatory Visit (INDEPENDENT_AMBULATORY_CARE_PROVIDER_SITE_OTHER): Payer: No Payment, Other | Admitting: Psychiatry

## 2020-07-27 ENCOUNTER — Other Ambulatory Visit: Payer: Self-pay

## 2020-07-27 VITALS — BP 134/98 | HR 99 | Ht 63.0 in | Wt 150.0 lb

## 2020-07-27 DIAGNOSIS — F0632 Mood disorder due to known physiological condition with major depressive-like episode: Secondary | ICD-10-CM

## 2020-07-27 DIAGNOSIS — F419 Anxiety disorder, unspecified: Secondary | ICD-10-CM | POA: Diagnosis not present

## 2020-07-27 MED ORDER — DULOXETINE HCL 30 MG PO CPEP: 30.0000 mg | ORAL_CAPSULE | Freq: Two times a day (BID) | ORAL | 2 refills | Status: DC

## 2020-07-27 MED ORDER — MIRTAZAPINE 15 MG PO TABS: 15.0000 mg | ORAL_TABLET | Freq: Every day | ORAL | 2 refills | Status: DC

## 2020-07-27 NOTE — Progress Notes (Signed)
Psychiatric Initial Adult Assessment   Patient Identification: [name] [name] MRN:  [id number] Date of Evaluation:  07/27/2020   Referral Source:  [facility]  Chief Complaint:  " I am sleeping better but still feel depressed."  Visit Diagnosis:    ICD-10-CM   1. 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 Anxiety  F41.9     History of Present Illness: This is a 24 year old female with history of systemic lupus erythematosus, Raynaud's disease, hypertension, anemia of chronic disease, gout pulmonary embolism and lower extremity DVT and depression now seen for follow-up.  Pt was noted to be walking without a cane today. Patient reported that she started sleeping better after she started taking 2 tablets of mirtazapine.  She also reported that she is eating better now.  However she continues to feel depressed at times.  She stated that she has been pushing herself to do better however still feels sad and feels like her mood is not much different.  She stated that she was supposed to go to a baby shower therefore has been taking extra doses of prednisone for the last 2-3 days so that she can build up some energy to have good time there.  She stated that she often does that however her rheumatologist has advised her not to do so because excessive use of prednisone is impacting her kidneys. She informed that she did not have money to fill prescription for Ativan so she did not try that. She informed that her sister is going to have a Thanksgiving lunch however she is not in any mood to enjoy with them and wants to be left alone because she does not do well with too many people around her.  She denies feeling hopeless or helpless.  She denies any suicidal ideations.  Past meds-has tried Zoloft and Prozac-did not find them to be too helpful.  Has also taken Wellbutrin and did not think it helped her much either.  Patient was agreeable to  trying Cymbalta to help with her depressive mood. Potential side effects of medication and risks vs benefits of treatment vs non-treatment were explained and discussed. All questions were answered.   Past Psychiatric History: MDD, anxiety, panic episodes  Past Medical History:  Past Medical History:  Diagnosis Date  . Accidental methotrexate overdose 01/08/2018  . Acute lower UTI   . Bronchitis 03/19/2019  . Cardiomegaly 05/08/2019  . Chronic midline low back pain without sciatica 11/09/2016   Facet joint arthropathy  . Dehydration 03/19/2019  . Depo-Provera contraceptive status 08/11/2015  . Depression 02/08/2017  . Dysphagia 06/01/2018  . Dysuria 04/19/2019  . Elevated AST (SGOT) 03/19/2019  . Essential hypertension 03/19/2019  . Facial edema   . Gout   . Hypertension   . IDA (iron deficiency anemia) 08/11/2015  . Immunosuppressed status ([place])   . Insomnia 01/29/2016  . Leukopenia 01/10/2018  . Lobar pneumonia ([place]) 06/07/2018  . Lupus ([place])   . Lupus (systemic lupus erythematosus) ([place]) 03/19/2019  . Metabolic acidosis 03/13/5365  . Methotrexate toxicity   . Mouth ulcers   . Mucositis oral 01/12/2018  . Odynophagia 06/01/2018  . Oral thrush 03/19/2019  . Pleuritic chest pain 06/05/2018  . Primary osteoarthritis of both knees 11/09/2016  . Rash due to systemic lupus erythematosus (SLE) ([place])   . Raynaud disease   . Raynaud's disease 08/11/2015  . Relative acute adrenal insufficiency ([place]) 06/11/2018  . Right leg DVT ([place]) 05/08/2019  .  Seasonal allergies 08/11/2015  . Sepsis ([place]) 05/08/2019  . Sinus tachycardia 03/19/2019  . SLE (systemic lupus erythematosus) ([place]) 09/19/2016     Positive ANA, double-stranded DNA, Ro, RNP, RF, history of nasal ulcers, fatigue, anemia, arthritis, Raynauds  . Thrush 06/01/2018  . Vaginal candidiasis 01/12/2018  . Vitamin D deficiency 11/09/2016    Past Surgical History:  Procedure Laterality Date  . ORIF SHOULDER FRACTURE Left   . TONSILLECTOMY      Family  Psychiatric History: Mother-substance abuse issues  Family History:  Family History  Problem Relation Age of Onset  . Aneurysm Mother   . Stroke Sister   . Seizures Sister   . Lung cancer Maternal Aunt   . Heart attack Paternal Uncle   . Colon cancer Neg Hx   . Esophageal cancer Neg Hx   . Pancreatic cancer Neg Hx   . Stomach cancer Neg Hx     Social History:   Social History   Socioeconomic History  . Marital status: Single    Spouse name: Not on file  . Number of children: Not on file  . Years of education: Not on file  . Highest education level: Not on file  Occupational History  . Not on file  Tobacco Use  . Smoking status: Never Smoker  . Smokeless tobacco: Never Used  Vaping Use  . Vaping Use: Never used  Substance and Sexual Activity  . Alcohol use: No  . Drug use: No  . Sexual activity: Not Currently  Other Topics Concern  . Not on file  Social History Narrative  . Not on file   Social Determinants of Health   Financial Resource Strain:   . Difficulty of Paying Living Expenses: Not on file  Food Insecurity:   . Worried About Charity fundraiser in the Last Year: Not on file  . Ran Out of Food in the Last Year: Not on file  Transportation Needs:   . Lack of Transportation (Medical): Not on file  . Lack of Transportation (Non-Medical): Not on file  Physical Activity:   . Days of Exercise per Week: Not on file  . Minutes of Exercise per Session: Not on file  Stress:   . Feeling of Stress : Not on file  Social Connections:   . Frequency of Communication with Friends and Family: Not on file  . Frequency of Social Gatherings with Friends and Family: Not on file  . Attends Religious Services: Not on file  . Active Member of Clubs or Organizations: Not on file  . Attends Archivist Meetings: Not on file  . Marital Status: Not on file    Additional Social History: Lives with sister  Allergies:   Allergies  Allergen Reactions  . Latex Rash     Metabolic Disorder Labs: Lab Results  Component Value Date   HGBA1C 4.5 (L) 03/20/2019   MPG 82.45 03/20/2019   No results found for: PROLACTIN No results found for: CHOL, TRIG, HDL, CHOLHDL, VLDL, LDLCALC Lab Results  Component Value Date   TSH 1.180 12/07/2019    Therapeutic Level Labs: No results found for: LITHIUM No results found for: CBMZ No results found for: VALPROATE  Current Medications: Current Outpatient Medications  Medication Sig Dispense Refill  . amLODipine (NORVASC) 10 MG tablet Take 1 tablet (10 mg total) by mouth daily. 30 tablet 0  . ferrous sulfate 325 (65 FE) MG tablet Take 1 tablet (325 mg total) by mouth 2 (two) times daily. With  1/2 glass of orange juice 30 tablet 0  . folic acid (FOLVITE) 1 MG tablet Take 1 tablet (1 mg total) by mouth at bedtime. 30 tablet 0  . gabapentin (NEURONTIN) 300 MG capsule TAKE 1 CAPSULE BY MOUTH THREE TIMES A DAY 90 capsule 3  . hydroxychloroquine (PLAQUENIL) 200 MG tablet Take by mouth daily.    Marland Kitchen lisinopril (ZESTRIL) 20 MG tablet Take 20 mg by mouth daily.     Marland Kitchen LORazepam (ATIVAN) 0.5 MG tablet Take 1 tablet (0.5 mg total) by mouth 2 (two) times daily as needed for anxiety. 20 tablet 2  . mirtazapine (REMERON) 7.5 MG tablet TAKE ONE TABLET AT BEDTIME, INCREASE TO 2 TABLETS AT BEDTIME AFTER 2 WEEKS. 180 tablet 1  . Multiple Vitamins-Minerals (MULTIVITAMIN ADULT PO) Take 1 tablet by mouth daily.    Marland Kitchen omeprazole (PRILOSEC) 20 MG capsule Take 1 capsule (20 mg total) by mouth daily. 30 capsule 3  . tacrolimus (PROGRAF) 1 MG capsule Take 1 mg by mouth 2 (two) times daily.     . Vitamin D, Ergocalciferol, (DRISDOL) 50000 units CAPS capsule Take 1 capsule (50,000 Units total) by mouth 2 (two) times a week. 24 capsule 0  . rivaroxaban (XARELTO) 10 MG TABS tablet Take 1 tablet (10 mg total) by mouth daily with supper. (Patient not taking: Reported on 06/24/2020) 30 tablet 6   No current facility-administered medications for this  visit.    Musculoskeletal: Strength & Muscle Tone: within normal limits Gait & Station: normal Patient leans: N/A  Psychiatric Specialty Exam: Review of Systems  Blood pressure (!) 134/98, pulse 99, height 5\' 3"  (1.6 m), weight 150 lb (68 kg), SpO2 100 %.Body mass index is 26.57 kg/m.  General Appearance: Fairly Groomed  Eye Contact:  Fair  Speech:  Clear and Coherent and Slow  Volume:  Normal  Mood:  Depressed  Affect:  Congruent  Thought Process:  Goal Directed and Descriptions of Associations: Intact  Orientation:  Full (Time, Place, and Person)  Thought Content:  Logical  Suicidal Thoughts:  No  Homicidal Thoughts:  No  Memory:  Immediate;   Good Recent;   Good Remote;   Good  Judgement:  Fair  Insight:  Fair  Psychomotor Activity:  Decreased  Concentration:  Concentration: Good and Attention Span: Good  Recall:  Good  Fund of Knowledge:Good  Language: Good  Akathisia:  Negative  Handed:  Right  AIMS (if indicated):  Not done  Assets:  Communication Skills Desire for Improvement Financial Resources/Insurance Housing  ADL's:  Intact  Cognition: WNL  Sleep:  Good, Improved   Screenings: PHQ2-9     Office Visit from 06/24/2020 in [place] Office Visit from 03/23/2020 in [place] Office Visit from 01/28/2020 in [place] Office Visit from 12/11/2019 in [place] Office Visit from 12/03/2019 in [place]  PHQ-2 Total Score 0 3 0 1 1  PHQ-9 Total Score -- 16 -- -- --      Assessment and Plan: Patient is reporting improvement in her sleep and appetite however still having ongoing depressive symptoms.  She is agreeable to trial of Cymbalta to address depressive symptoms.  She did not try lorazepam for PRN use for anxiety due to financial constraints.  1. 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  Dispense: 90 tablet; Refill: 1 - Start Cymbalta 30 mg BID.  2. Anxiety - mirtazapine (REMERON) 7.5 MG tablet; Take one tablet at bedtime, increase to 2 tablets at bedtime after 2 weeks.  Dispense: 90 tablet; Refill: 1 - LORazepam (ATIVAN) 0.5 MG tablet; Take 1 tablet (0.5 mg total) by mouth 2 (two) times daily as needed for anxiety.  Dispense: 20 tablet; Refill: 2  Supportive therapy provided during the session.  F/up in 2 months.  [place] Crane, MD 11/15/20219:12 AM

## 2020-08-03 ENCOUNTER — Other Ambulatory Visit: Payer: Self-pay

## 2020-08-03 ENCOUNTER — Telehealth: Payer: Self-pay | Admitting: Physician Assistant

## 2020-09-14 ENCOUNTER — Other Ambulatory Visit: Payer: Self-pay

## 2020-09-14 ENCOUNTER — Ambulatory Visit (HOSPITAL_COMMUNITY)
Admission: EM | Admit: 2020-09-14 | Discharge: 2020-09-14 | Disposition: A | Discharge status: Home / Self Care | Payer: Medicaid Other | Attending: Family Medicine | Admitting: Family Medicine

## 2020-09-14 ENCOUNTER — Encounter (HOSPITAL_COMMUNITY): Payer: Self-pay

## 2020-09-14 DIAGNOSIS — Z20822 Contact with and (suspected) exposure to covid-19: Secondary | ICD-10-CM | POA: Diagnosis present

## 2020-09-14 DIAGNOSIS — J069 Acute upper respiratory infection, unspecified: Secondary | ICD-10-CM

## 2020-09-14 DIAGNOSIS — M329 Systemic lupus erythematosus, unspecified: Secondary | ICD-10-CM

## 2020-09-14 LAB — SARS CORONAVIRUS 2 (TAT 6-24 HRS): SARS Coronavirus 2: NEGATIVE

## 2020-09-14 MED ORDER — METHYLPREDNISOLONE SODIUM SUCC 125 MG IJ SOLR
INTRAMUSCULAR | Status: AC
  Filled 2020-09-14: qty 2

## 2020-09-14 MED ORDER — METHYLPREDNISOLONE SODIUM SUCC 125 MG IJ SOLR
80.0000 mg | Freq: Once | INTRAMUSCULAR | Status: AC
  Administered 2020-09-14: 80 mg via INTRAMUSCULAR

## 2020-09-14 MED ORDER — METHYLPREDNISOLONE ACETATE 80 MG/ML IJ SUSP
INTRAMUSCULAR | Status: AC
  Filled 2020-09-14: qty 1

## 2020-09-14 NOTE — ED Provider Notes (Signed)
[place]    CSN: [id number] Arrival date & time: 09/14/20  1248      History   Chief Complaint Chief Complaint  Patient presents with  . Generalized Body Aches  . Fatigue    HPI [name] [name] is a 25 y.o. female.   HPI   Patient states that she is having body aches and fatigue.  Is been going on for 2 days.  She states the achiness is in her knees and ankles, and also in the muscles around her back.  She thinks this is a lupus flare.  She states that she took prednisone for 5 years but then was told she had developed lupus nephritis.  Was told that she needed to taper off of her steroids.  She states she has not had them for some time.  Is reluctant to take any steroids even with this flare.  She has some cold symptoms as well.  Sinus congestion.  Runny nose.  No coughing or shortness of breath.  No chest pain.  No known exposure to Covid. Patient states that she has not had Covid vaccinations.  She states that this was on the advice of her doctor.  I did look up her medical records from [facility].  They state that she refused Covid vaccinations.  I did look up her most recent lab work.  She has significant proteinuria.  Creatinine and GFR are normal  Past Medical History:  Diagnosis Date  . Accidental methotrexate overdose 01/08/2018  . Acute lower UTI   . Bronchitis 03/19/2019  . Cardiomegaly 05/08/2019  . Chronic midline low back pain without sciatica 11/09/2016   Facet joint arthropathy  . Dehydration 03/19/2019  . Depo-Provera contraceptive status 08/11/2015  . Depression 02/08/2017  . Dysphagia 06/01/2018  . Dysuria 04/19/2019  . Elevated AST (SGOT) 03/19/2019  . Essential hypertension 03/19/2019  . Facial edema   . Gout   . Hypertension   . IDA (iron deficiency anemia) 08/11/2015  . Immunosuppressed status ([place])   . Insomnia 01/29/2016  . Leukopenia 01/10/2018  . Lobar pneumonia ([place]) 06/07/2018  . Lupus ([place])   . Lupus (systemic lupus erythematosus) ([place]) 03/19/2019   . Metabolic acidosis [id number]  . Methotrexate toxicity   . Mouth ulcers   . Mucositis oral 01/12/2018  . Odynophagia 06/01/2018  . Oral thrush 03/19/2019  . Pleuritic chest pain 06/05/2018  . Primary osteoarthritis of both knees 11/09/2016  . Rash due to systemic lupus erythematosus (SLE) ([place])   . Raynaud disease   . Raynaud's disease 08/11/2015  . Relative acute adrenal insufficiency ([place]) 06/11/2018  . Right leg DVT ([place]) 05/08/2019  . Seasonal allergies 08/11/2015  . Sepsis ([place]) 05/08/2019  . Sinus tachycardia 03/19/2019  . SLE (systemic lupus erythematosus) ([place]) 09/19/2016     Positive ANA, double-stranded DNA, Ro, RNP, RF, history of nasal ulcers, fatigue, anemia, arthritis, Raynauds  . Thrush 06/01/2018  . Vaginal candidiasis 01/12/2018  . Vitamin D deficiency 11/09/2016    Patient Active Problem List   Diagnosis Date Noted  . 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 05/27/2020  . Anxiety 05/27/2020  . PVD (peripheral vascular disease) ([place]) 02/18/2020  . Exacerbation of systemic lupus erythematosus ([place]) 12/06/2019  . Pulmonary embolus ([place]) 05/28/2019  . Sepsis ([place]) 05/08/2019  . Right leg DVT ([place]) 05/08/2019  . Cardiomegaly 05/08/2019  . Dysuria 04/19/2019  . Facial edema   . Rash due to systemic lupus erythematosus (SLE) ([place])   .  Immunosuppressed status ([place])   . Acute lower UTI   . 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 Lupus (systemic lupus erythematosus) ([place]) 03/19/2019  . Bronchitis 03/19/2019  . Essential hypertension 03/19/2019  . Elevated AST (SGOT) 03/19/2019  . Dehydration 03/19/2019  . Sinus tachycardia 03/19/2019  . Metabolic acidosis 0000000  . Oral thrush 03/19/2019  . Relative acute adrenal insufficiency ([place]) 06/11/2018  . Anemia 06/09/2018  . Lobar pneumonia ([place]) 06/07/2018  . Pleuritic chest pain 06/05/2018  . Dysphagia 06/01/2018  . Odynophagia  06/01/2018  . Thrush 06/01/2018  . Mucositis oral 01/12/2018  . Vaginal candidiasis 01/12/2018  . Methotrexate toxicity   . Mouth ulcers   . Leukopenia 01/10/2018  . Accidental methotrexate overdose 01/08/2018  . Depression 02/08/2017  . Primary osteoarthritis of both knees 11/09/2016  . Chronic midline low back pain without sciatica 11/09/2016  . Vitamin D deficiency 11/09/2016  . SLE (systemic lupus erythematosus) ([place]) 09/19/2016  . Insomnia 01/29/2016  . Depo-Provera contraceptive status 08/11/2015  . Raynaud's disease 08/11/2015  . Seasonal allergies 08/11/2015  . IDA (iron deficiency anemia) 08/11/2015    Past Surgical History:  Procedure Laterality Date  . ORIF SHOULDER FRACTURE Left   . TONSILLECTOMY      OB History   No obstetric history on file.      Home Medications    Prior to Admission medications   Medication Sig Start Date End Date Taking? Authorizing Provider  amLODipine (NORVASC) 10 MG tablet Take 1 tablet (10 mg total) by mouth daily. 03/26/19   Kayleen Memos, DO  DULoxetine (CYMBALTA) 30 MG capsule Take 1 capsule (30 mg total) by mouth 2 (two) times daily. 07/27/20   [place] Crane, MD  ferrous sulfate 325 (65 FE) MG tablet Take 1 tablet (325 mg total) by mouth 2 (two) times daily. With 1/2 glass of orange juice 12/15/15   Davonna Belling, MD  folic acid (FOLVITE) 1 MG tablet Take 1 tablet (1 mg total) by mouth at bedtime. 03/25/19   Kayleen Memos, DO  gabapentin (NEURONTIN) 300 MG capsule TAKE 1 CAPSULE BY MOUTH THREE TIMES A DAY 12/23/19   Dorena Dew, FNP  hydroxychloroquine (PLAQUENIL) 200 MG tablet Take by mouth daily.    [provider]  lisinopril (ZESTRIL) 20 MG tablet Take 20 mg by mouth daily.  06/18/19   [provider]  LORazepam (ATIVAN) 0.5 MG tablet Take 1 tablet (0.5 mg total) by mouth 2 (two) times daily as needed for anxiety. 05/28/20   [place] Crane, MD  mirtazapine (REMERON) 15 MG tablet Take 1 tablet (15 mg total)  by mouth at bedtime. 07/27/20   [place] Crane, MD  Multiple Vitamins-Minerals (MULTIVITAMIN ADULT PO) Take 1 tablet by mouth daily.    [provider]  omeprazole (PRILOSEC) 20 MG capsule Take 1 capsule (20 mg total) by mouth daily. 12/24/19   Dorena Dew, FNP  tacrolimus (PROGRAF) 1 MG capsule Take 1 mg by mouth 2 (two) times daily.  07/25/19   [provider]  Vitamin D, Ergocalciferol, (DRISDOL) 50000 units CAPS capsule Take 1 capsule (50,000 Units total) by mouth 2 (two) times a week. 12/21/17   Bo Merino, MD    Family History Family History  Problem Relation Age of Onset  . Aneurysm Mother   . Stroke Sister   . Seizures Sister   . Lung cancer Maternal Aunt   . Heart attack Paternal Uncle   . Colon cancer  Neg Hx   . Esophageal cancer Neg Hx   . Pancreatic cancer Neg Hx   . Stomach cancer Neg Hx     Social History Social History   Tobacco Use  . Smoking status: Never Smoker  . Smokeless tobacco: Never Used  Vaping Use  . Vaping Use: Never used  Substance Use Topics  . Alcohol use: No  . Drug use: No     Allergies   Latex   Review of Systems Review of Systems See HPI  Physical Exam Triage Vital Signs ED Triage Vitals  Enc Vitals Group     BP 09/14/20 1352 (!) 149/98     Pulse Rate 09/14/20 1352 83     Resp 09/14/20 1352 18     Temp 09/14/20 1352 99 F (37.2 C)     Temp src --      SpO2 09/14/20 1352 97 %     Weight --      Height --      Head Circumference --      Peak Flow --      Pain Score 09/14/20 1350 8     Pain Loc --      Pain Edu? --      Excl. in GC? --    No data found.  Updated Vital Signs BP (!) 149/98   Pulse 83   Temp 99 F (37.2 C)   Resp 18   SpO2 97%      Physical Exam Constitutional:      General: She is not in acute distress.    Appearance: She is well-developed, normal weight and well-nourished.     Comments: Appears uncomfortable.  Slow guarded movements  HENT:     Head: Normocephalic  and atraumatic.     Right Ear: Tympanic membrane and ear canal normal.     Left Ear: Tympanic membrane and ear canal normal.     Nose: Nose normal.     Mouth/Throat:     Pharynx: Posterior oropharyngeal erythema present.  Eyes:     Conjunctiva/sclera: Conjunctivae normal.     Pupils: Pupils are equal, round, and reactive to light.  Cardiovascular:     Rate and Rhythm: Normal rate and regular rhythm.     Heart sounds: Normal heart sounds.  Pulmonary:     Effort: Pulmonary effort is normal. No respiratory distress.     Breath sounds: Normal breath sounds.  Abdominal:     General: There is no distension.     Palpations: Abdomen is soft.  Musculoskeletal:        General: No edema. Normal range of motion.     Cervical back: Normal range of motion.     Comments: Some swelling across the dorsum of the left hand.  No synovitis of joints.  Tenderness in the upper body of the trapezius muscles.  Knees have no warmth swelling or tenderness.  Very guarded antalgic gait  Skin:    General: Skin is warm and dry.  Neurological:     General: No focal deficit present.     Mental Status: She is alert.  Psychiatric:        Behavior: Behavior normal.      UC Treatments / Results  Labs (all labs ordered are listed, but only abnormal results are displayed) Labs Reviewed  SARS CORONAVIRUS 2 (TAT 6-24 HRS)    EKG   Radiology No results found.  Procedures Procedures (including critical care time)  Medications Ordered in UC Medications  methylPREDNISolone sodium succinate (SOLU-MEDROL) 125 mg/2 mL injection 80 mg (80 mg Intramuscular Given 09/14/20 1538)    Initial Impression / Assessment and Plan / UC Course  I have reviewed the triage vital signs and the nursing notes.  Pertinent labs & imaging results that were available during my care of the patient were reviewed by me and considered in my medical decision making (see chart for details).     Patient states that she was going to  have a shot of what ever they gave her in the ER last time she went in for a lupus flare.  Review of chart indicates that this is methylprednisolone.  Explained to her that this is a prednisone derivative, but should not significantly worsen her nephritis with a single injection.  She needs to call her rheumatologist for future flares Final Clinical Impressions(s) / UC Diagnoses   Final diagnoses:  Acute upper respiratory infection  Encounter for laboratory testing for COVID-19 virus  Exacerbation of systemic lupus erythematosus ([place])     Discharge Instructions     Go home to rest Drink plenty of fluids Take Tylenol for pain or fever You may take over-the-counter cough and cold medicines as needed You must quarantine at home until your test result is available You can check for your test result in MyChart Call your rheumatologist about the lupus symptoms   ED Prescriptions    None     PDMP not reviewed this encounter.   Raylene Everts, MD 09/14/20 [phone]

## 2020-09-14 NOTE — Discharge Instructions (Signed)
Go home to rest Drink plenty of fluids Take Tylenol for pain or fever You may take over-the-counter cough and cold medicines as needed You must quarantine at home until your test result is available You can check for your test result in MyChart Call your rheumatologist about the lupus symptoms

## 2020-09-14 NOTE — ED Triage Notes (Signed)
Pt in with body aches and feeling fatigued for the last 2 days  Pt states she has lupus but is not sure if she is having a flare up

## 2020-09-16 ENCOUNTER — Encounter (HOSPITAL_COMMUNITY): Payer: Self-pay | Admitting: Emergency Medicine

## 2020-09-16 ENCOUNTER — Ambulatory Visit (HOSPITAL_COMMUNITY)
Admission: EM | Admit: 2020-09-16 | Discharge: 2020-09-16 | Disposition: A | Discharge status: Home / Self Care | Payer: Medicaid Other | Attending: Family Medicine | Admitting: Family Medicine

## 2020-09-16 DIAGNOSIS — R059 Cough, unspecified: Secondary | ICD-10-CM | POA: Insufficient documentation

## 2020-09-16 DIAGNOSIS — Z20822 Contact with and (suspected) exposure to covid-19: Secondary | ICD-10-CM | POA: Insufficient documentation

## 2020-09-16 DIAGNOSIS — R519 Headache, unspecified: Secondary | ICD-10-CM | POA: Diagnosis present

## 2020-09-16 NOTE — ED Triage Notes (Signed)
Pt states that she a dry cough, body aches, and feels fatigue. Pt states that her sx started three days ago.  Pt states that her father tested positive for Covid.

## 2020-09-16 NOTE — ED Provider Notes (Signed)
[facility]   [id number] 09/16/20 Arrival Time: 1245  ASSESSMENT & PLAN:  1. Exposure to COVID-19 virus   2. Nonintractable headache, unspecified chronicity pattern, unspecified headache type   3. Cough      COVID-19 testing sent. See letter/work note on file for self-isolation guidelines. OTC symptom care as needed.     Follow-up Information    Barbette Merino, NP.   Specialty: Adult Health Nurse Practitioner Why: As needed. Contact information: [address] [place] [address] [phone]               Reviewed expectations re: course of current medical issues. Questions answered. Outlined signs and symptoms indicating need for more acute intervention. Understanding verbalized. After Visit Summary given.   SUBJECTIVE: History from: patient. [name] [name] is a 25 y.o. female who presents with worries regarding COVID-19. Known COVID-19 contact: father tested pos a couple of d agy. Recent travel: none. Reports: HA, dry cough; yesterday. Denies: fever and SOB. Normal PO intake without n/v/d.  Desires repeat COVID testing.  OBJECTIVE:  Vitals:   09/16/20 1518  BP: (!) 128/100  Pulse: (!) 112  Resp: 18  Temp: (!) 97.1 F (36.2 C)  TempSrc: Tympanic    Tachycardia noted. General appearance: alert; no distress Eyes: PERRLA; EOMI; conjunctiva normal HENT: [place]; AT; with nasal congestion Neck: supple  Lungs: speaks full sentences without difficulty; unlabored; dry cough. Extremities: no edema Skin: warm and dry Neurologic: normal gait Psychological: alert and cooperative; normal mood and affect  Labs: Results for orders placed or performed during the hospital encounter of 09/14/20  SARS CORONAVIRUS 2 (TAT 6-24 HRS) Nasopharyngeal Nasopharyngeal Swab   Specimen: Nasopharyngeal Swab  Result Value Ref Range   SARS Coronavirus 2 NEGATIVE NEGATIVE   Labs Reviewed  SARS CORONAVIRUS 2 (TAT 6-24 HRS)     Allergies  Allergen Reactions   . Latex Rash    Past Medical History:  Diagnosis Date  . Accidental methotrexate overdose 01/08/2018  . Acute lower UTI   . Bronchitis 03/19/2019  . Cardiomegaly 05/08/2019  . Chronic midline low back pain without sciatica 11/09/2016   Facet joint arthropathy  . Dehydration 03/19/2019  . Depo-Provera contraceptive status 08/11/2015  . Depression 02/08/2017  . Dysphagia 06/01/2018  . Dysuria 04/19/2019  . Elevated AST (SGOT) 03/19/2019  . Essential hypertension 03/19/2019  . Facial edema   . Gout   . Hypertension   . IDA (iron deficiency anemia) 08/11/2015  . Immunosuppressed status (H[facility])   . Insomnia 01/29/2016  . Leukopenia 01/10/2018  . Lobar pneumonia (H[facility]) 06/07/2018  . Lupus (H[facility])   . Lupus (systemic lupus erythematosus) ([facility]) 03/19/2019  . Metabolic acidosis 03/19/2019  . Methotrexate toxicity   . Mouth ulcers   . Mucositis oral 01/12/2018  . Odynophagia 06/01/2018  . Oral thrush 03/19/2019  . Pleuritic chest pain 06/05/2018  . Primary osteoarthritis of both knees 11/09/2016  . Rash due to systemic lupus erythematosus (SLE) (H[facility])   . Raynaud disease   . Raynaud's disease 08/11/2015  . Relative acute adrenal insufficiency (HCC) 06/11/2018  . Right leg DVT (H[facility]) 05/08/2019  . Seasonal allergies 08/11/2015  . Sepsis (H[facility]) 05/08/2019  . Sinus tachycardia 03/19/2019  . SLE (systemic lupus erythematosus) (H[facility]) 09/19/2016     Positive ANA, double-stranded DNA, Ro, RNP, RF, history of nasal ulcers, fatigue, anemia, arthritis, Raynauds  . Thrush 06/01/2018  . Vaginal candidiasis 01/12/2018  . Vitamin D deficiency 11/09/2016   Social History  Socioeconomic History  . Marital status: Single    Spouse name: Not on file  . Number of children: Not on file  . Years of education: Not on file  . Highest education level: Not on file  Occupational History  . Not on file  Tobacco Use  . Smoking status: Never Smoker  . Smokeless tobacco: Never Used  Vaping Use  . Vaping Use: Never used  Substance and  Sexual Activity  . Alcohol use: No  . Drug use: No  . Sexual activity: Not Currently  Other Topics Concern  . Not on file  Social History Narrative  . Not on file   Social Determinants of Health   Financial Resource Strain: Not on file  Food Insecurity: Not on file  Transportation Needs: Not on file  Physical Activity: Not on file  Stress: Not on file  Social Connections: Not on file  Intimate Partner Violence: Not on file   Family History  Problem Relation Age of Onset  . Aneurysm Mother   . Stroke Sister   . Seizures Sister   . Lung cancer Maternal Aunt   . Heart attack Paternal Uncle   . Colon cancer Neg Hx   . Esophageal cancer Neg Hx   . Pancreatic cancer Neg Hx   . Stomach cancer Neg Hx    Past Surgical History:  Procedure Laterality Date  . ORIF SHOULDER FRACTURE Left   . Evonnie Dawes, MD 09/16/20 [phone]

## 2020-09-16 NOTE — Discharge Instructions (Signed)
You have been tested for COVID-19 today. °If your test returns positive, you will receive a phone call from [facility] regarding your results. °Negative test results are not called. °Both positive and negative results area always visible on MyChart. °If you do not have a MyChart account, sign up instructions are provided in your discharge papers. °Please do not hesitate to contact us should you have questions or concerns. ° °

## 2020-09-17 LAB — SARS CORONAVIRUS 2 (TAT 6-24 HRS): SARS Coronavirus 2: NEGATIVE

## 2020-09-29 ENCOUNTER — Ambulatory Visit (HOSPITAL_COMMUNITY): Payer: No Payment, Other | Admitting: Psychiatry

## 2020-10-09 NOTE — Progress Notes (Signed)
..  The following Assist/Replace Program for [place] from [facility] has been terminated due to Medicaid effective 07/13/2020.  Last DOS: none listed.

## 2020-11-03 ENCOUNTER — Ambulatory Visit (HOSPITAL_COMMUNITY): Payer: Medicaid Other | Admitting: Psychiatry

## 2020-11-06 ENCOUNTER — Encounter: Payer: Self-pay | Admitting: Hematology & Oncology

## 2020-11-06 ENCOUNTER — Inpatient Hospital Stay: Payer: Medicaid Other | Attending: Hematology & Oncology

## 2020-11-06 ENCOUNTER — Other Ambulatory Visit: Payer: Self-pay

## 2020-11-06 ENCOUNTER — Inpatient Hospital Stay (HOSPITAL_BASED_OUTPATIENT_CLINIC_OR_DEPARTMENT_OTHER): Payer: Medicaid Other | Admitting: Hematology & Oncology

## 2020-11-06 VITALS — BP 112/68 | HR 96 | Temp 98.4°F | Resp 16 | Wt 136.0 lb

## 2020-11-06 DIAGNOSIS — M3212 Pericarditis in systemic lupus erythematosus: Secondary | ICD-10-CM

## 2020-11-06 DIAGNOSIS — I2699 Other pulmonary embolism without acute cor pulmonale: Secondary | ICD-10-CM | POA: Diagnosis not present

## 2020-11-06 DIAGNOSIS — I7300 Raynaud's syndrome without gangrene: Secondary | ICD-10-CM | POA: Insufficient documentation

## 2020-11-06 DIAGNOSIS — M320 Drug-induced systemic lupus erythematosus: Secondary | ICD-10-CM | POA: Diagnosis not present

## 2020-11-06 DIAGNOSIS — D500 Iron deficiency anemia secondary to blood loss (chronic): Secondary | ICD-10-CM | POA: Diagnosis not present

## 2020-11-06 DIAGNOSIS — D649 Anemia, unspecified: Secondary | ICD-10-CM | POA: Diagnosis not present

## 2020-11-06 DIAGNOSIS — K219 Gastro-esophageal reflux disease without esophagitis: Secondary | ICD-10-CM | POA: Diagnosis not present

## 2020-11-06 DIAGNOSIS — N189 Chronic kidney disease, unspecified: Secondary | ICD-10-CM | POA: Diagnosis not present

## 2020-11-06 DIAGNOSIS — I82411 Acute embolism and thrombosis of right femoral vein: Secondary | ICD-10-CM

## 2020-11-06 DIAGNOSIS — R760 Raised antibody titer: Secondary | ICD-10-CM

## 2020-11-06 DIAGNOSIS — Z86718 Personal history of other venous thrombosis and embolism: Secondary | ICD-10-CM | POA: Insufficient documentation

## 2020-11-06 DIAGNOSIS — Z86711 Personal history of pulmonary embolism: Secondary | ICD-10-CM | POA: Diagnosis not present

## 2020-11-06 DIAGNOSIS — M329 Systemic lupus erythematosus, unspecified: Secondary | ICD-10-CM | POA: Insufficient documentation

## 2020-11-06 LAB — CBC WITH DIFFERENTIAL (CANCER CENTER ONLY)
Abs Immature Granulocytes: 0.02 10*3/uL (ref 0.00–0.07)
Basophils Absolute: 0 10*3/uL (ref 0.0–0.1)
Basophils Relative: 0 %
Eosinophils Absolute: 0.1 10*3/uL (ref 0.0–0.5)
Eosinophils Relative: 2 %
HCT: 27.3 % — ABNORMAL LOW (ref 36.0–46.0)
Hemoglobin: 8.7 g/dL — ABNORMAL LOW (ref 12.0–15.0)
Immature Granulocytes: 1 %
Lymphocytes Relative: 32 %
Lymphs Abs: 1.3 10*3/uL (ref 0.7–4.0)
MCH: 24.4 pg — ABNORMAL LOW (ref 26.0–34.0)
MCHC: 31.9 g/dL (ref 30.0–36.0)
MCV: 76.5 fL — ABNORMAL LOW (ref 80.0–100.0)
Monocytes Absolute: 0.2 10*3/uL (ref 0.1–1.0)
Monocytes Relative: 5 %
Neutro Abs: 2.4 10*3/uL (ref 1.7–7.7)
Neutrophils Relative %: 60 %
Platelet Count: 328 10*3/uL (ref 150–400)
RBC: 3.57 MIL/uL — ABNORMAL LOW (ref 3.87–5.11)
RDW: 16.9 % — ABNORMAL HIGH (ref 11.5–15.5)
WBC Count: 3.9 10*3/uL — ABNORMAL LOW (ref 4.0–10.5)
nRBC: 0 % (ref 0.0–0.2)

## 2020-11-06 LAB — CMP (CANCER CENTER ONLY)
ALT: 12 U/L (ref 0–44)
AST: 22 U/L (ref 15–41)
Albumin: 2.8 g/dL — ABNORMAL LOW (ref 3.5–5.0)
Alkaline Phosphatase: 96 U/L (ref 38–126)
Anion gap: 5 (ref 5–15)
BUN: 19 mg/dL (ref 6–20)
CO2: 22 mmol/L (ref 22–32)
Calcium: 8.7 mg/dL — ABNORMAL LOW (ref 8.9–10.3)
Chloride: 107 mmol/L (ref 98–111)
Creatinine: 0.95 mg/dL (ref 0.44–1.00)
GFR, Estimated: >60 mL/min (ref >60)
Glucose, Bld: 90 mg/dL (ref 70–99)
Potassium: 4.2 mmol/L (ref 3.5–5.1)
Sodium: 134 mmol/L — ABNORMAL LOW (ref 135–145)
Total Bilirubin: 0.2 mg/dL — ABNORMAL LOW (ref 0.3–1.2)
Total Protein: 7.7 g/dL (ref 6.5–8.1)

## 2020-11-06 NOTE — Progress Notes (Signed)
Hematology and Oncology Follow Up Visit  [name] [name] [id number] [birth year] 24 y.o. 11/06/2020   Principle Diagnosis:  DVT of the right lower extremity by [place] and VQ scan indeterminate of PE in August/September 2020 Lupus anticoagulant positive Iron deficiency  Current Therapy:        Oral iron supplement daily   EC ASA 162 mg po q day -- start on 11/06/2020   Interim History:  [name] [name] is here today for follow-up.  She is actually doing pretty well.  She did stop her Xarelto.  She stopped it herself.  This was back a couple months ago from what she said.  She said that since there is no blood clot in her leg, she would stop the Xarelto.  She has had no flareups of the lupus.  Sounds like she may have some Raynaud's going on right now.  I do not think it would be a bad idea to have her on baby aspirin.  I will see no reason why she could not take baby aspirin.  I told her to take coated aspirin 162 mg daily with food.  I see that she does have GERD.  We will have to watch out with this.  She is quite anemic today.  I suspect that her iron is going to be low.  We will have to see what her iron levels are.  I suspect she probably is going to need IV iron.  There has been no fever.  She has been avoiding the coronavirus which is certainly important.  Is been no change in bowel or bladder habits.  She has had no leg swelling.  Overall, her performance status is ECOG 1.    Medications:  Allergies as of 11/06/2020      Reactions   Latex Rash      Medication List       Accurate as of November 06, 2020  1:23 PM. If you have any questions, ask your nurse or doctor.        STOP taking these medications   DULoxetine 30 MG capsule Commonly known as: Cymbalta Stopped by: Volanda Napoleon, MD   gabapentin 300 MG capsule Commonly known as: NEURONTIN Stopped by: Volanda Napoleon, MD   LORazepam 0.5 MG tablet Commonly known as: Ativan Stopped by: Volanda Napoleon, MD     TAKE  these medications   amLODipine 10 MG tablet Commonly known as: NORVASC Take 1 tablet (10 mg total) by mouth daily.   ferrous sulfate 325 (65 FE) MG tablet Take 1 tablet (325 mg total) by mouth 2 (two) times daily. With 1/2 glass of orange juice   folic acid 1 MG tablet Commonly known as: FOLVITE Take 1 tablet (1 mg total) by mouth at bedtime.   hydroxychloroquine 200 MG tablet Commonly known as: PLAQUENIL Take by mouth daily.   lisinopril 20 MG tablet Commonly known as: ZESTRIL Take 20 mg by mouth daily.   mirtazapine 15 MG tablet Commonly known as: Remeron Take 1 tablet (15 mg total) by mouth at bedtime.   MULTIVITAMIN ADULT PO Take 1 tablet by mouth daily.   omeprazole 20 MG capsule Commonly known as: PRILOSEC Take 1 capsule (20 mg total) by mouth daily.   tacrolimus 1 MG capsule Commonly known as: PROGRAF Take 1 mg by mouth 2 (two) times daily.   Vitamin D (Ergocalciferol) 1.25 MG (50000 UNIT) Caps capsule Commonly known as: DRISDOL Take 1 capsule (50,000 Units total) by mouth 2 (two)  times a week.       Allergies:  Allergies  Allergen Reactions  . Latex Rash    Past Medical History, Surgical history, Social history, and Family History were reviewed and updated.  Review of Systems: Review of Systems  Constitutional: Negative.   HENT: Negative.   Eyes: Negative.   Respiratory: Negative.   Cardiovascular: Negative.   Gastrointestinal: Negative.   Genitourinary: Negative.   Musculoskeletal: Negative.   Skin: Positive for rash.  Neurological: Negative.   Endo/Heme/Allergies: Negative.   Psychiatric/Behavioral: Negative.      Physical Exam:  weight is 136 lb (61.7 kg). Her oral temperature is 98.4 F (36.9 C). Her blood pressure is 112/68 and her pulse is 96. Her respiration is 16.   Wt Readings from Last 3 Encounters:  11/06/20 136 lb (61.7 kg)  06/24/20 151 lb (68.5 kg)  05/06/20 152 lb 6.4 oz (69.1 kg)    Physical Exam Vitals reviewed.   HENT:     Head: Normocephalic and atraumatic.     Mouth/Throat:     Mouth: Oropharynx is clear and moist.  Eyes:     Extraocular Movements: EOM normal.     Pupils: Pupils are equal, round, and reactive to light.  Cardiovascular:     Rate and Rhythm: Normal rate and regular rhythm.     Heart sounds: Normal heart sounds.  Pulmonary:     Effort: Pulmonary effort is normal.     Breath sounds: Normal breath sounds.  Abdominal:     General: Bowel sounds are normal.     Palpations: Abdomen is soft.  Musculoskeletal:        General: No tenderness, deformity or edema. Normal range of motion.     Cervical back: Normal range of motion.  Lymphadenopathy:     Cervical: No cervical adenopathy.  Skin:    General: Skin is warm and dry.     Findings: No erythema or rash.  Neurological:     Mental Status: She is alert and oriented to person, place, and time.  Psychiatric:        Mood and Affect: Mood and affect normal.        Behavior: Behavior normal.        Thought Content: Thought content normal.        Judgment: Judgment normal.    Lab Results  Component Value Date   WBC 3.9 (L) 11/06/2020   HGB 8.7 (L) 11/06/2020   HCT 27.3 (L) 11/06/2020   MCV 76.5 (L) 11/06/2020   PLT 328 11/06/2020   Lab Results  Component Value Date   FERRITIN 8 (L) 05/06/2020   IRON 26 (L) 05/06/2020   TIBC 291 05/06/2020   UIBC 266 05/06/2020   IRONPCTSAT 9 (L) 05/06/2020   Lab Results  Component Value Date   RETICCTPCT 1.5 12/03/2019   RBC 3.57 (L) 11/06/2020   No results found for: KPAFRELGTCHN, LAMBDASER, KAPLAMBRATIO No results found for: IGGSERUM, IGA, IGMSERUM No results found for: Kathrynn Ducking, MSPIKE, SPEI   Chemistry      Component Value Date/Time   NA 134 (L) 11/06/2020 1136   NA 140 12/03/2019 1449   K 4.2 11/06/2020 1136   CL 107 11/06/2020 1136   CO2 22 11/06/2020 1136   BUN 19 11/06/2020 1136   BUN 11 12/03/2019 1449   CREATININE  0.95 11/06/2020 1136   CREATININE 0.57 12/11/2017 1018      Component Value Date/Time   CALCIUM 8.7 (L)  11/06/2020 1136   ALKPHOS 96 11/06/2020 1136   AST 22 11/06/2020 1136   ALT 12 11/06/2020 1136   BILITOT 0.2 (L) 11/06/2020 1136       Impression and Plan: [name] [name] is a very pleasant 25 yo African American female with history of systemic lupus erythematosus and Raynaud's. She was diagnosed with right lower extremity DVT and VQ scan was indeterminate for PE back in August/September 2020.  Again, she is off Xarelto.  She stopped this herself.  We will get her on baby aspirin.  I think this would be very reasonable.  I am not sure why she did not get IV iron when she was here last.  We will try to get her set up for this again.  I have to believe that her iron is going to be quite low.  I really do want to get her back in 2 months.  I think she needs close follow-up for all of her problems.  I want to make sure that we help her hemoglobin.  Of note, she does have a low erythropoietin level.  Back in 2020 her erythropoietin level was only 13.  As such, she may need ESA although this might be difficult to give given her history of thromboembolic disease.  Last time she was here, she did not have a lupus anticoagulant in the blood.    Volanda Napoleon, MD 2/25/20221:23 PM

## 2020-11-07 LAB — LUPUS ANTICOAGULANT PANEL
DRVVT: 31.6 s (ref 0.0–47.0)
PTT Lupus Anticoagulant: 36 s (ref 0.0–51.9)

## 2020-11-08 LAB — CARDIOLIPIN ANTIBODIES, IGG, IGM, IGA
Anticardiolipin IgA: <9 APL U/mL (ref 0–11)
Anticardiolipin IgG: <9 GPL U/mL (ref 0–14)
Anticardiolipin IgM: 27 MPL U/mL — ABNORMAL HIGH (ref 0–12)

## 2020-11-09 ENCOUNTER — Telehealth: Payer: Self-pay | Admitting: *Deleted

## 2020-11-09 ENCOUNTER — Other Ambulatory Visit: Payer: Self-pay | Admitting: Family

## 2020-11-09 LAB — IRON AND TIBC
Iron: 18 ug/dL — ABNORMAL LOW (ref 41–142)
Saturation Ratios: 7 % — ABNORMAL LOW (ref 21–57)
TIBC: 265 ug/dL (ref 236–444)
UIBC: 247 ug/dL (ref 120–384)

## 2020-11-09 LAB — FERRITIN: Ferritin: 56 ng/mL (ref 11–307)

## 2020-11-09 NOTE — Telephone Encounter (Signed)
Per scheduled message 11/09/20 from Judson Roch - gave upcoming appts to patient

## 2020-11-12 ENCOUNTER — Other Ambulatory Visit: Payer: Self-pay | Admitting: *Deleted

## 2020-11-12 ENCOUNTER — Encounter (HOSPITAL_BASED_OUTPATIENT_CLINIC_OR_DEPARTMENT_OTHER): Payer: Self-pay

## 2020-11-12 ENCOUNTER — Other Ambulatory Visit: Payer: Self-pay | Admitting: Hematology & Oncology

## 2020-11-12 ENCOUNTER — Other Ambulatory Visit: Payer: Self-pay

## 2020-11-12 ENCOUNTER — Inpatient Hospital Stay (HOSPITAL_BASED_OUTPATIENT_CLINIC_OR_DEPARTMENT_OTHER): Payer: Medicaid Other | Admitting: Hematology & Oncology

## 2020-11-12 ENCOUNTER — Ambulatory Visit (HOSPITAL_BASED_OUTPATIENT_CLINIC_OR_DEPARTMENT_OTHER)
Admission: RE | Admit: 2020-11-12 | Discharge: 2020-11-12 | Disposition: A | Discharge status: Home / Self Care | Payer: Medicaid Other | Source: Ambulatory Visit | Attending: Hematology & Oncology | Admitting: Hematology & Oncology

## 2020-11-12 ENCOUNTER — Inpatient Hospital Stay: Payer: Medicaid Other | Attending: Family

## 2020-11-12 VITALS — BP 115/73 | HR 132 | Temp 100.5°F

## 2020-11-12 DIAGNOSIS — R071 Chest pain on breathing: Secondary | ICD-10-CM | POA: Diagnosis not present

## 2020-11-12 DIAGNOSIS — R0781 Pleurodynia: Secondary | ICD-10-CM | POA: Diagnosis not present

## 2020-11-12 DIAGNOSIS — E611 Iron deficiency: Secondary | ICD-10-CM | POA: Diagnosis not present

## 2020-11-12 DIAGNOSIS — J189 Pneumonia, unspecified organism: Secondary | ICD-10-CM | POA: Insufficient documentation

## 2020-11-12 DIAGNOSIS — D500 Iron deficiency anemia secondary to blood loss (chronic): Secondary | ICD-10-CM

## 2020-11-12 MED ORDER — FAMOTIDINE IN NACL 20-0.9 MG/50ML-% IV SOLN
INTRAVENOUS | Status: AC
  Filled 2020-11-12: qty 50

## 2020-11-12 MED ORDER — METHYLPREDNISOLONE SODIUM SUCC 125 MG IJ SOLR
INTRAMUSCULAR | Status: AC
  Filled 2020-11-12: qty 2

## 2020-11-12 MED ORDER — IOHEXOL 350 MG/ML SOLN: 100.0000 mL | Freq: Once | INTRAVENOUS | Status: DC | PRN

## 2020-11-12 MED ORDER — RIVAROXABAN 20 MG PO TABS: 20.0000 mg | ORAL_TABLET | Freq: Every day | ORAL | 1 refills | Status: DC

## 2020-11-12 MED ORDER — SODIUM CHLORIDE 0.9 % IV SOLN
510.0000 mg | Freq: Once | INTRAVENOUS | Status: AC
  Administered 2020-11-12: 510 mg via INTRAVENOUS
  Filled 2020-11-12: qty 17

## 2020-11-12 MED ORDER — CEFDINIR 300 MG PO CAPS: 600.0000 mg | ORAL_CAPSULE | Freq: Every day | ORAL | 0 refills | Status: AC

## 2020-11-12 MED ORDER — SODIUM CHLORIDE 0.9 % IV SOLN
Freq: Once | INTRAVENOUS | Status: AC
  Filled 2020-11-12: qty 250

## 2020-11-12 MED ORDER — FAMOTIDINE IN NACL 20-0.9 MG/50ML-% IV SOLN
20.0000 mg | Freq: Once | INTRAVENOUS | Status: AC
  Administered 2020-11-12: 20 mg via INTRAVENOUS

## 2020-11-12 MED ORDER — METHYLPREDNISOLONE SODIUM SUCC 125 MG IJ SOLR
125.0000 mg | Freq: Once | INTRAMUSCULAR | Status: AC
  Administered 2020-11-12: 125 mg via INTRAVENOUS

## 2020-11-12 NOTE — Progress Notes (Signed)
Per Dr. Marin Olp, VO for Rx Omnicef 600mg  once PO Daily x 7days. Xarelto 20mg  1 PO Daily, RX sent to [facility] per pt request. Pt to start taking medications tonight. Pt given instructions and verbalized understanding.

## 2020-11-12 NOTE — Progress Notes (Signed)
1445-Temp-100.5, HR-132, BP-115/73 and O2 sat-100%, resp-24.  Pt states that she feels a "tightness under her right breast" which she states has been there for two days.  Dr. Marin Olp notified.  Order received for pt to have a stat CT angiogram per Dr. Marin Olp. Pt assisted to radiology via w/c.

## 2020-11-12 NOTE — Progress Notes (Signed)
Dr. Marin Olp to infusion room to assess patient after CT scan.  Pt notified per order of Dr. Marin Olp that a VQ scan will be ordered and done tomorrow.  Pt instructed if SOB or chest pain worsens to call 911.  Teach back done.  Pt states that she will go to her pharmacy now to get Xarelto and Omnicef and start them tonight.

## 2020-11-12 NOTE — Progress Notes (Signed)
[name] [name] was in the treatment area today.  She was getting IV iron.  She said that she been having some problems with pain on the right side.  This was a pulling type of pain.  In the treatment area, she was somewhat febrile with a temperature of 100.1.  She was tachycardic.  We try to get a CT angiogram on her.  She does have a history of thromboembolic disease.  She stopped Xarelto herself.  I had her on baby aspirin.  Unfortunately, we cannot do the CT angiogram because she has incredibly poor IV access.  We did get a regular CT chest without contrast.  This did show some consolidation in the right lower lobe.  This was consistent with "hypoventilatory changes" versus bronchopneumonia.  She is not short of breath.  Her O2 saturation 100%.  I do still worry about the possibility of a pulmonary embolism.  As such, I told her to restart Xarelto.  We will start her tonight on 20 mg a day.  I will get a VQ scan on her.  I just worry about that possibility of pulmonary embolic disease.  When I examined her, she had good breath sounds bilaterally.  I heard no wheezing.  She had no friction rub.  Cardiac exam was tachycardic but regular.  She had no murmurs.  Her neck was without any adenopathy.  I will go ahead and put her on Omnicef for her pneumonia.  We will put her on 600 mg daily for 7 days.  As always, the nurses did a fantastic job with her.  I want to be very cautious with her and not be too "cavalier" with her symptoms.  I just worry about the thromboembolic potential that she has.  I told her to make sure that she call us if she is having any problems with worsening chest pain, shortness of breath, or coughing up blood.  Lattie Haw, MD

## 2020-11-13 ENCOUNTER — Ambulatory Visit (HOSPITAL_COMMUNITY)
Admission: RE | Admit: 2020-11-13 | Discharge: 2020-11-13 | Disposition: A | Discharge status: Home / Self Care | Payer: Medicaid Other | Source: Ambulatory Visit | Attending: Hematology & Oncology | Admitting: Hematology & Oncology

## 2020-11-13 ENCOUNTER — Emergency Department (HOSPITAL_COMMUNITY)
Admission: EM | Admit: 2020-11-13 | Discharge: 2020-11-13 | Disposition: A | Discharge status: Home / Self Care | Payer: Medicaid Other | Attending: Emergency Medicine | Admitting: Emergency Medicine

## 2020-11-13 ENCOUNTER — Emergency Department (HOSPITAL_COMMUNITY): Payer: Medicaid Other

## 2020-11-13 ENCOUNTER — Other Ambulatory Visit: Payer: Self-pay | Admitting: *Deleted

## 2020-11-13 ENCOUNTER — Encounter (HOSPITAL_COMMUNITY): Payer: Self-pay

## 2020-11-13 ENCOUNTER — Other Ambulatory Visit: Payer: Self-pay

## 2020-11-13 ENCOUNTER — Encounter (HOSPITAL_COMMUNITY): Payer: Self-pay | Admitting: Emergency Medicine

## 2020-11-13 ENCOUNTER — Telehealth: Payer: Self-pay | Admitting: *Deleted

## 2020-11-13 DIAGNOSIS — Z79899 Other long term (current) drug therapy: Secondary | ICD-10-CM | POA: Diagnosis not present

## 2020-11-13 DIAGNOSIS — Z8739 Personal history of other diseases of the musculoskeletal system and connective tissue: Secondary | ICD-10-CM | POA: Diagnosis not present

## 2020-11-13 DIAGNOSIS — Z86718 Personal history of other venous thrombosis and embolism: Secondary | ICD-10-CM | POA: Insufficient documentation

## 2020-11-13 DIAGNOSIS — U071 COVID-19: Secondary | ICD-10-CM | POA: Diagnosis not present

## 2020-11-13 DIAGNOSIS — R071 Chest pain on breathing: Secondary | ICD-10-CM

## 2020-11-13 DIAGNOSIS — Z91040 Latex allergy status: Secondary | ICD-10-CM | POA: Diagnosis not present

## 2020-11-13 DIAGNOSIS — I10 Essential (primary) hypertension: Secondary | ICD-10-CM | POA: Diagnosis not present

## 2020-11-13 DIAGNOSIS — J189 Pneumonia, unspecified organism: Secondary | ICD-10-CM

## 2020-11-13 DIAGNOSIS — R059 Cough, unspecified: Secondary | ICD-10-CM | POA: Diagnosis present

## 2020-11-13 DIAGNOSIS — R0781 Pleurodynia: Secondary | ICD-10-CM | POA: Insufficient documentation

## 2020-11-13 LAB — CBC WITH DIFFERENTIAL/PLATELET
Abs Immature Granulocytes: 0.03 10*3/uL (ref 0.00–0.07)
Basophils Absolute: 0 10*3/uL (ref 0.0–0.1)
Basophils Relative: 0 %
Eosinophils Absolute: 0 10*3/uL (ref 0.0–0.5)
Eosinophils Relative: 0 %
HCT: 29.6 % — ABNORMAL LOW (ref 36.0–46.0)
Hemoglobin: 9.4 g/dL — ABNORMAL LOW (ref 12.0–15.0)
Immature Granulocytes: 1 %
Lymphocytes Relative: 37 %
Lymphs Abs: 2.4 10*3/uL (ref 0.7–4.0)
MCH: 25.1 pg — ABNORMAL LOW (ref 26.0–34.0)
MCHC: 31.8 g/dL (ref 30.0–36.0)
MCV: 79.1 fL — ABNORMAL LOW (ref 80.0–100.0)
Monocytes Absolute: 0.5 10*3/uL (ref 0.1–1.0)
Monocytes Relative: 8 %
Neutro Abs: 3.5 10*3/uL (ref 1.7–7.7)
Neutrophils Relative %: 54 %
Platelets: 439 10*3/uL — ABNORMAL HIGH (ref 150–400)
RBC: 3.74 MIL/uL — ABNORMAL LOW (ref 3.87–5.11)
RDW: 17.1 % — ABNORMAL HIGH (ref 11.5–15.5)
WBC: 6.5 10*3/uL (ref 4.0–10.5)
nRBC: 0 % (ref 0.0–0.2)

## 2020-11-13 LAB — BASIC METABOLIC PANEL
Anion gap: 9 (ref 5–15)
BUN: 22 mg/dL — ABNORMAL HIGH (ref 6–20)
CO2: 22 mmol/L (ref 22–32)
Calcium: 9.4 mg/dL (ref 8.9–10.3)
Chloride: 111 mmol/L (ref 98–111)
Creatinine, Ser: 1.07 mg/dL — ABNORMAL HIGH (ref 0.44–1.00)
GFR, Estimated: >60 mL/min (ref >60)
Glucose, Bld: 84 mg/dL (ref 70–99)
Potassium: 3.5 mmol/L (ref 3.5–5.1)
Sodium: 142 mmol/L (ref 135–145)

## 2020-11-13 LAB — I-STAT BETA HCG BLOOD, ED (MC, WL, AP ONLY): I-stat hCG, quantitative: <5 m[IU]/mL (ref <5)

## 2020-11-13 MED ORDER — IOHEXOL 350 MG/ML SOLN
100.0000 mL | Freq: Once | INTRAVENOUS | Status: AC | PRN
  Administered 2020-11-13: 49 mL via INTRAVENOUS

## 2020-11-13 NOTE — ED Triage Notes (Signed)
Patient sent over from CT, she needs a CT chest w/ contrast to rule out a blood clot, but her insurance will not authorize it in a timely manner per pt. Had SOB yesterday, has since resolved. No complaints at this time.

## 2020-11-13 NOTE — ED Provider Notes (Signed)
[facility] DEPT Provider Note   CSN: [id number] Arrival date & time: 11/13/20  1258     History Chief Complaint  Patient presents with  . needs CT scan    [name] [name] is a 25 y.o. female.  HPI   25 year old female with past medical history of SLE with previous DVT (was on Xarelto, self stopped the medication about 3 months ago), Raynaud's, HTN presents the emergency department with concern for rule out PE.  The concern is coming from her primary caregivers.  Over the past week she has been having heaviness in her right lower chest, intermittent pleuritic chest pain, cough and shortness of breath.  She has had a low-grade fever.  They ordered an outpatient CAT scan without and it identified a right lower lobe pneumonia however they still have elevated concern for possible thromboembolic disease.  They started her on antibiotics and Xarelto yesterday, they have encouraged the patient to come to the ER to have a CTA to rule out PE.  Patient states that her symptoms are slightly improved when compared to yesterday, she no long or has any fevers.  She has no active chest pain currently.  No swelling of her lower extremities.  Past Medical History:  Diagnosis Date  . Accidental methotrexate overdose 01/08/2018  . Acute lower UTI   . Bronchitis 03/19/2019  . Cardiomegaly 05/08/2019  . Chronic midline low back pain without sciatica 11/09/2016   Facet joint arthropathy  . Dehydration 03/19/2019  . Depo-Provera contraceptive status 08/11/2015  . Depression 02/08/2017  . Dysphagia 06/01/2018  . Dysuria 04/19/2019  . Elevated AST (SGOT) 03/19/2019  . Essential hypertension 03/19/2019  . Facial edema   . Gout   . Hypertension   . IDA (iron deficiency anemia) 08/11/2015  . Immunosuppressed status ([place])   . Insomnia 01/29/2016  . Leukopenia 01/10/2018  . Lobar pneumonia ([place]) 06/07/2018  . Lupus ([place])   . Lupus (systemic lupus erythematosus) ([place]) 03/19/2019  . Metabolic  acidosis 09/13/1973  . Methotrexate toxicity   . Mouth ulcers   . Mucositis oral 01/12/2018  . Odynophagia 06/01/2018  . Oral thrush 03/19/2019  . Pleuritic chest pain 06/05/2018  . Primary osteoarthritis of both knees 11/09/2016  . Rash due to systemic lupus erythematosus (SLE) ([place])   . Raynaud disease   . Raynaud's disease 08/11/2015  . Relative acute adrenal insufficiency ([place]) 06/11/2018  . Right leg DVT ([place]) 05/08/2019  . Seasonal allergies 08/11/2015  . Sepsis ([place]) 05/08/2019  . Sinus tachycardia 03/19/2019  . SLE (systemic lupus erythematosus) ([place]) 09/19/2016     Positive ANA, double-stranded DNA, Ro, RNP, RF, history of nasal ulcers, fatigue, anemia, arthritis, Raynauds  . Thrush 06/01/2018  . Vaginal candidiasis 01/12/2018  . Vitamin D deficiency 11/09/2016    Patient Active Problem List   Diagnosis Date Noted  . 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 05/27/2020  . Anxiety 05/27/2020  . PVD (peripheral vascular disease) ([place]) 02/18/2020  . Exacerbation of systemic lupus erythematosus ([place]) 12/06/2019  . Pulmonary embolus ([place]) 05/28/2019  . Sepsis ([place]) 05/08/2019  . Right leg DVT ([place]) 05/08/2019  . Cardiomegaly 05/08/2019  . Dysuria 04/19/2019  . Facial edema   . Rash due to systemic lupus erythematosus (SLE) ([place])   . Immunosuppressed status ([place])   . Acute lower UTI   . 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 Lupus (systemic lupus erythematosus) ([place]) 03/19/2019  .  Bronchitis 03/19/2019  . Essential hypertension 03/19/2019  . Elevated AST (SGOT) 03/19/2019  . Dehydration 03/19/2019  . Sinus tachycardia 03/19/2019  . Metabolic acidosis 97/67/3419  . Oral thrush 03/19/2019  . Relative acute adrenal insufficiency ([place]) 06/11/2018  . Anemia 06/09/2018  . Lobar pneumonia ([place]) 06/07/2018  . Pleuritic chest pain 06/05/2018  . Dysphagia 06/01/2018  . Odynophagia 06/01/2018   . Thrush 06/01/2018  . Mucositis oral 01/12/2018  . Vaginal candidiasis 01/12/2018  . Methotrexate toxicity   . Mouth ulcers   . Leukopenia 01/10/2018  . Accidental methotrexate overdose 01/08/2018  . Depression 02/08/2017  . Primary osteoarthritis of both knees 11/09/2016  . Chronic midline low back pain without sciatica 11/09/2016  . Vitamin D deficiency 11/09/2016  . SLE (systemic lupus erythematosus) ([place]) 09/19/2016  . Insomnia 01/29/2016  . Depo-Provera contraceptive status 08/11/2015  . Raynaud's disease 08/11/2015  . Seasonal allergies 08/11/2015  . IDA (iron deficiency anemia) 08/11/2015    Past Surgical History:  Procedure Laterality Date  . ORIF SHOULDER FRACTURE Left   . TONSILLECTOMY       OB History   No obstetric history on file.     Family History  Problem Relation Age of Onset  . Aneurysm Mother   . Stroke Sister   . Seizures Sister   . Lung cancer Maternal Aunt   . Heart attack Paternal Uncle   . Colon cancer Neg Hx   . Esophageal cancer Neg Hx   . Pancreatic cancer Neg Hx   . Stomach cancer Neg Hx     Social History   Tobacco Use  . Smoking status: Never Smoker  . Smokeless tobacco: Never Used  Vaping Use  . Vaping Use: Never used  Substance Use Topics  . Alcohol use: No  . Drug use: No    Home Medications Prior to Admission medications   Medication Sig Start Date End Date Taking? Authorizing Provider  amLODipine (NORVASC) 10 MG tablet Take 1 tablet (10 mg total) by mouth daily. 03/26/19   Kayleen Memos, DO  cefdinir (OMNICEF) 300 MG capsule Take 2 capsules (600 mg total) by mouth daily for 7 days. 11/12/20 11/19/20  Volanda Napoleon, MD  ferrous sulfate 325 (65 FE) MG tablet Take 1 tablet (325 mg total) by mouth 2 (two) times daily. With 1/2 glass of orange juice 12/15/15   Davonna Belling, MD  folic acid (FOLVITE) 1 MG tablet Take 1 tablet (1 mg total) by mouth at bedtime. 03/25/19   Kayleen Memos, DO  hydroxychloroquine (PLAQUENIL)  200 MG tablet Take by mouth daily.    [provider]  lisinopril (ZESTRIL) 20 MG tablet Take 20 mg by mouth daily.  06/18/19   [provider]  mirtazapine (REMERON) 15 MG tablet Take 1 tablet (15 mg total) by mouth at bedtime. 07/27/20   Nevada Crane, MD  Multiple Vitamins-Minerals (MULTIVITAMIN ADULT PO) Take 1 tablet by mouth daily.    [provider]  omeprazole (PRILOSEC) 20 MG capsule Take 1 capsule (20 mg total) by mouth daily. 12/24/19   Dorena Dew, FNP  rivaroxaban (XARELTO) 20 MG TABS tablet Take 1 tablet (20 mg total) by mouth daily with supper. 11/12/20   Volanda Napoleon, MD  tacrolimus (PROGRAF) 1 MG capsule Take 1 mg by mouth 2 (two) times daily.  07/25/19   [provider]  Vitamin D, Ergocalciferol, (DRISDOL) 50000 units CAPS capsule Take 1 capsule (50,000 Units total) by mouth 2 (two) times a week.  12/21/17   Bo Merino, MD    Allergies    Latex  Review of Systems   Review of Systems  Constitutional: Negative for chills and fever.  HENT: Negative for congestion.   Eyes: Negative for visual disturbance.  Respiratory: Positive for cough, chest tightness and shortness of breath.   Cardiovascular: Negative for chest pain, palpitations and leg swelling.  Gastrointestinal: Negative for abdominal pain, diarrhea and vomiting.  Genitourinary: Negative for dysuria.  Skin: Negative for rash.  Neurological: Negative for syncope, light-headedness and headaches.    Physical Exam Updated Vital Signs BP (!) 130/101 (BP Location: Right Arm)   Pulse 99   Temp 98.5 F (36.9 C) (Oral)   Resp 19   Ht 5\' 3"  (1.6 m)   Wt 62 kg   SpO2 99%   BMI 24.21 kg/m   Physical Exam Vitals and nursing note reviewed.  Constitutional:      Appearance: Normal appearance.  HENT:     Head: Normocephalic.     Mouth/Throat:     Mouth: Mucous membranes are moist.  Cardiovascular:     Rate and Rhythm: Normal rate.  Pulmonary:     Effort: Pulmonary  effort is normal. No respiratory distress.     Breath sounds: No wheezing.  Chest:     Chest wall: No tenderness.  Abdominal:     Palpations: Abdomen is soft.     Tenderness: There is no abdominal tenderness.  Musculoskeletal:        General: No swelling.  Skin:    General: Skin is warm.  Neurological:     Mental Status: She is alert and oriented to person, place, and time. Mental status is at baseline.  Psychiatric:        Mood and Affect: Mood normal.     ED Results / Procedures / Treatments   Labs (all labs ordered are listed, but only abnormal results are displayed) Labs Reviewed - No data to display  EKG None  Radiology CT CHEST WO CONTRAST  Result Date: 11/12/2020 CLINICAL DATA:  Short of breath for 2 days, hypertension EXAM: CT CHEST WITHOUT CONTRAST TECHNIQUE: Multidetector CT imaging of the chest was performed following the standard protocol without IV contrast. COMPARISON:  12/07/2019 FINDINGS: Cardiovascular: Unenhanced imaging of the heart and great vessels demonstrates no pericardial effusion. Normal caliber of the thoracic aorta. Evaluation of the vascular lumen is limited without intravenous contrast. Mediastinum/Nodes: Multiple subcentimeter lymph nodes are seen within the bilateral axilla, stable. No pathologic adenopathy. Thyroid, trachea, and esophagus are unremarkable. Lungs/Pleura: There is patchy consolidation in the right lower lobe, consistent with hypoventilatory changes versus bronchopneumonia. No effusion or pneumothorax. The central airways are patent. Upper Abdomen: No acute abnormality. Musculoskeletal: No acute or destructive bony lesions. Reconstructed images demonstrate no additional findings. IMPRESSION: 1. Patchy right lower lobe consolidation, which may reflect hypoventilatory changes or mild bronchopneumonia. Electronically Signed   By: Randa Ngo M.D.   On: 11/12/2020 16:35    Procedures Procedures   Medications Ordered in ED Medications -  No data to display  ED Course  I have reviewed the triage vital signs and the nursing notes.  Pertinent labs & imaging results that were available during my care of the patient were reviewed by me and considered in my medical decision making (see chart for details).    MDM Rules/Calculators/A&P  25 year old female was sent to the emergency department to rule out PE with a CT PE study.  Patient had a previous CT done without IV contrast that identified pneumonia.  She is currently on antibiotic and they restarted her Xarelto after finding out that she self stopped it.  Vital signs are stable, she is sitting up with no specific complaints.  CT shows no pulmonary embolism.  Patient is already on antibiotics, I have advised that she follows this regimen and continues the Xarelto as she has been directed by her primary doctor.  COVID and flu swab will be sent.  Patient will be discharged and treated as an outpatient.  Discharge plan and strict return to ED precautions discussed, patient verbalizes understanding and agreement.  Final Clinical Impression(s) / ED Diagnoses Final diagnoses:  None    Rx / DC Orders ED Discharge Orders    None       Lorelle Gibbs, DO 11/13/20 1645

## 2020-11-13 NOTE — Discharge Instructions (Addendum)
You have been seen and discharged from the emergency department.  You have been swabbed for Covid and flu, should have the results in the next 24 to 48 hours.  Follow-up with your primary provider for reevaluation. Take home medications as prescribed. If you have any worsening symptoms or further concerns for health please return to an emergency department for further evaluation.

## 2020-11-13 NOTE — Telephone Encounter (Signed)
No los 11/12/20 to schedule

## 2020-11-14 LAB — SARS CORONAVIRUS 2 (TAT 6-24 HRS): SARS Coronavirus 2: POSITIVE — AB

## 2020-11-15 ENCOUNTER — Telehealth: Payer: Self-pay | Admitting: Family

## 2020-11-15 NOTE — Telephone Encounter (Signed)
Called to discuss with patient about COVID-19 symptoms and the use of one of the available treatments for those with mild to moderate Covid symptoms and at a high risk of hospitalization.  Pt appears to qualify for outpatient treatment due to co-morbid conditions and/or a member of an at-risk group in accordance with the FDA Emergency Use Authorization.    Symptom onset:  Vaccinated:  Booster?  Immunocompromised? Yes Qualifiers: Systemic lupus erthematosus, hypertension  [name] [name] was seen at the [facility] ED on 11/13/20 with concern for possible PE with symptoms of heaviness in her chest, pleuritic chest pain, cough, shortness of breath and low grade fever that had been going on for about 1 week. CT without evidence of PE but consolidation noted with concern for multifocal pneumonia or possible aspiration. Covid testing positive.   I spoke with Ms. Mendizabal who indicated that she was not interested in treatment at this time. Call back number provided.   Terri Piedra, NP 11/15/2020 9:32 AM

## 2020-11-16 ENCOUNTER — Telehealth: Payer: Self-pay | Admitting: *Deleted

## 2020-11-16 NOTE — Telephone Encounter (Addendum)
Transition Care Management Follow-up Telephone Call  Date of discharge and from where: 11-13-2020 [facility]  How have you been since you were released from the hospital? Patient is COVID + still not feeling great, precaution gone over for return to ED or Urgent Care  Any questions or concerns? No  Items Reviewed:  Did the pt receive and understand the discharge instructions provided? Yes   Medications obtained and verified? Yes   Other? No   Any new allergies since your discharge? No   Dietary orders reviewed? yes  Do you have support at home? No   Lives alone    Functional Questionnaire: (I = Independent and D = Dependent) ADLs: I   Bathing/Dressing- I  Meal Prep- I  Eating- I  Maintaining continence- I  Transferring/Ambulation- I  Managing Meds- I  Follow up appointments reviewed:   PCP Hospital f/u appt confirmed? No  patient is COVID positive   [facility] f/u appt confirmed? No   Are transportation arrangements needed? No   If their condition worsens, is the pt aware to call PCP or go to the Emergency Dept.?  YES  Was the patient provided with contact information for the PCP's office or ED? YES  Was to pt encouraged to call back with questions or concerns? YES

## 2020-11-19 ENCOUNTER — Inpatient Hospital Stay: Payer: Medicaid Other

## 2020-12-03 ENCOUNTER — Telehealth: Payer: Self-pay | Admitting: Nurse Practitioner

## 2020-12-04 ENCOUNTER — Telehealth: Payer: Self-pay

## 2020-12-04 ENCOUNTER — Ambulatory Visit: Payer: Medicaid Other

## 2020-12-04 NOTE — Telephone Encounter (Signed)
Pt no showed her appt today for iron and has r/s to 12/10/20  [name] [name]

## 2020-12-10 ENCOUNTER — Other Ambulatory Visit: Payer: Self-pay

## 2020-12-10 ENCOUNTER — Inpatient Hospital Stay: Payer: Medicaid Other

## 2020-12-10 VITALS — BP 125/71 | HR 97 | Temp 98.5°F | Resp 18

## 2020-12-10 DIAGNOSIS — D500 Iron deficiency anemia secondary to blood loss (chronic): Secondary | ICD-10-CM

## 2020-12-10 DIAGNOSIS — E611 Iron deficiency: Secondary | ICD-10-CM | POA: Diagnosis not present

## 2020-12-10 MED ORDER — FAMOTIDINE IN NACL 20-0.9 MG/50ML-% IV SOLN
20.0000 mg | Freq: Once | INTRAVENOUS | Status: AC
  Administered 2020-12-10: 20 mg via INTRAVENOUS

## 2020-12-10 MED ORDER — METHYLPREDNISOLONE SODIUM SUCC 125 MG IJ SOLR
125.0000 mg | Freq: Once | INTRAMUSCULAR | Status: AC
  Administered 2020-12-10: 125 mg via INTRAVENOUS

## 2020-12-10 MED ORDER — SODIUM CHLORIDE 0.9 % IV SOLN
Freq: Once | INTRAVENOUS | Status: AC
  Filled 2020-12-10: qty 250

## 2020-12-10 MED ORDER — METHYLPREDNISOLONE SODIUM SUCC 125 MG IJ SOLR
INTRAMUSCULAR | Status: AC
  Filled 2020-12-10: qty 2

## 2020-12-10 MED ORDER — FAMOTIDINE IN NACL 20-0.9 MG/50ML-% IV SOLN
INTRAVENOUS | Status: AC
  Filled 2020-12-10: qty 50

## 2020-12-10 MED ORDER — SODIUM CHLORIDE 0.9 % IV SOLN
510.0000 mg | Freq: Once | INTRAVENOUS | Status: AC
  Administered 2020-12-10: 510 mg via INTRAVENOUS
  Filled 2020-12-10: qty 510

## 2020-12-10 NOTE — Patient Instructions (Signed)
Ferumoxytol injection What is this medicine? FERUMOXYTOL is an iron complex. Iron is used to make healthy red blood cells, which carry oxygen and nutrients throughout the body. This medicine is used to treat iron deficiency anemia. This medicine may be used for other purposes; ask your health care provider or pharmacist if you have questions. COMMON BRAND NAME(S): Feraheme What should I tell my health care provider before I take this medicine? They need to know if you have any of these conditions:  anemia not caused by low iron levels  high levels of iron in the blood  magnetic resonance imaging (MRI) test scheduled  an unusual or allergic reaction to iron, other medicines, foods, dyes, or preservatives  pregnant or trying to get pregnant  breast-feeding How should I use this medicine? This medicine is for injection into a vein. It is given by a health care professional in a hospital or clinic setting. Talk to your pediatrician regarding the use of this medicine in children. Special care may be needed. Overdosage: If you think you have taken too much of this medicine contact a poison control center or emergency room at once. NOTE: This medicine is only for you. Do not share this medicine with others. What if I miss a dose? It is important not to miss your dose. Call your doctor or health care professional if you are unable to keep an appointment. What may interact with this medicine? This medicine may interact with the following medications:  other iron products This list may not describe all possible interactions. Give your health care provider a list of all the medicines, herbs, non-prescription drugs, or dietary supplements you use. Also tell them if you smoke, drink alcohol, or use illegal drugs. Some items may interact with your medicine. What should I watch for while using this medicine? Visit your doctor or healthcare professional regularly. Tell your doctor or healthcare  professional if your symptoms do not start to get better or if they get worse. You may need blood work done while you are taking this medicine. You may need to follow a special diet. Talk to your doctor. Foods that contain iron include: whole grains/cereals, dried fruits, beans, or peas, leafy green vegetables, and organ meats (liver, kidney). What side effects may I notice from receiving this medicine? Side effects that you should report to your doctor or health care professional as soon as possible:  allergic reactions like skin rash, itching or hives, swelling of the face, lips, or tongue  breathing problems  changes in blood pressure  feeling faint or lightheaded, falls  fever or chills  flushing, sweating, or hot feelings  swelling of the ankles or feet Side effects that usually do not require medical attention (report to your doctor or health care professional if they continue or are bothersome):  diarrhea  headache  nausea, vomiting  stomach pain This list may not describe all possible side effects. Call your doctor for medical advice about side effects. You may report side effects to FDA at 1-800-FDA-1088. Where should I keep my medicine? This drug is given in a hospital or clinic and will not be stored at home. NOTE: This sheet is a summary. It may not cover all possible information. If you have questions about this medicine, talk to your doctor, pharmacist, or health care provider.  2021 Elsevier/Gold Standard (2016-10-17 20:21:10)  

## 2020-12-16 ENCOUNTER — Encounter (HOSPITAL_COMMUNITY): Payer: Self-pay | Admitting: Psychiatry

## 2020-12-16 ENCOUNTER — Ambulatory Visit (INDEPENDENT_AMBULATORY_CARE_PROVIDER_SITE_OTHER): Payer: Medicaid Other | Admitting: Psychiatry

## 2020-12-16 ENCOUNTER — Other Ambulatory Visit: Payer: Self-pay

## 2020-12-16 DIAGNOSIS — F419 Anxiety disorder, unspecified: Secondary | ICD-10-CM

## 2020-12-16 DIAGNOSIS — F0632 Mood disorder due to known physiological condition with major depressive-like episode: Secondary | ICD-10-CM | POA: Diagnosis not present

## 2020-12-16 MED ORDER — MIRTAZAPINE 15 MG PO TABS: 15.0000 mg | ORAL_TABLET | Freq: Every day | ORAL | 2 refills | Status: DC

## 2020-12-16 MED ORDER — DULOXETINE HCL 30 MG PO CPEP: 30.0000 mg | ORAL_CAPSULE | Freq: Two times a day (BID) | ORAL | 2 refills | Status: DC

## 2020-12-16 NOTE — Progress Notes (Signed)
[facility] Progress Note  Patient Identification: [name] [name] MRN:  [id number] Date of Evaluation:  12/16/2020    Chief Complaint:  " I am okay."  Visit Diagnosis:    ICD-10-CM   1. 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 Anxiety  F41.9 DULoxetine (CYMBALTA) 30 MG capsule    History of Present Illness: This is a 25 year old female with history of systemic lupus erythematosus, Raynaud's disease, hypertension, anemia of chronic disease, gout pulmonary embolism and lower extremity DVT and depression now seen for follow-up.  Patient was noted to be walking well without the help of a cane or walker today.  Patient stated that she has been taking her Plaquenil regularly and she is in remission from her lupus symptoms right now.  She stated that she did not pick up Cymbalta because there was some confusion with the pharmacy.  She stated that maybe they needed prior authorization but she is not so sure.  She also informed that she started going to pain management and that is helped immensely. She informed that now she has been feeling better physically however she is starting to have some vision related issues.  She is not sure if she is having cataract issues or glaucoma.  She stated that she was informed that she will not be candidate for surgery. She stated that now that she is physically better she feels like she is nothing to do during the daytime and she is bored.  She is thinking about going back to school. Writer encouraged her to go back to school as that will be a good way to use her free time and also may be build up a career in the future. Writer advised her to talk to her ophthalmologist again and see if she will be needing any surgery and will be a candidate for surgical intervention as this way she can do well in school without any visual  impairments.  Writer asked the patient if she would like to try Cymbalta and she said she would like to try it.  Writer informed her that any prescription will be sent today and if there is any prior authorization needed the clinic staff will take care of it.   Past meds-has tried Zoloft and Prozac-did not find them to be too helpful.  Has also taken Wellbutrin and did not think it helped her much either.  Past Psychiatric History: MDD, anxiety, panic episodes  Past Medical History:  Past Medical History:  Diagnosis Date  . Accidental methotrexate overdose 01/08/2018  . Acute lower UTI   . Bronchitis 03/19/2019  . Cardiomegaly 05/08/2019  . Chronic midline low back pain without sciatica 11/09/2016   Facet joint arthropathy  . Dehydration 03/19/2019  . Depo-Provera contraceptive status 08/11/2015  . Depression 02/08/2017  . Dysphagia 06/01/2018  . Dysuria 04/19/2019  . Elevated AST (SGOT) 03/19/2019  . Essential hypertension 03/19/2019  . Facial edema   . Gout   . Hypertension   . IDA (iron deficiency anemia) 08/11/2015  . Immunosuppressed status ([place])   . Insomnia 01/29/2016  . Leukopenia 01/10/2018  . Lobar pneumonia ([place]) 06/07/2018  . Lupus ([place])   . Lupus (systemic lupus erythematosus) ([place]) 03/19/2019  . Metabolic acidosis 05/20/9891  . Methotrexate toxicity   . Mouth ulcers   . Mucositis oral 01/12/2018  . Odynophagia 06/01/2018  . Oral thrush 03/19/2019  .  Pleuritic chest pain 06/05/2018  . Primary osteoarthritis of both knees 11/09/2016  . Rash due to systemic lupus erythematosus (SLE) ([place])   . Raynaud disease   . Raynaud's disease 08/11/2015  . Relative acute adrenal insufficiency ([place]) 06/11/2018  . Right leg DVT ([place]) 05/08/2019  . Seasonal allergies 08/11/2015  . Sepsis ([place]) 05/08/2019  . Sinus tachycardia 03/19/2019  . SLE (systemic lupus erythematosus) ([place]) 09/19/2016     Positive ANA, double-stranded DNA, Ro, RNP, RF, history of nasal ulcers, fatigue, anemia, arthritis, Raynauds  .  Thrush 06/01/2018  . Vaginal candidiasis 01/12/2018  . Vitamin D deficiency 11/09/2016    Past Surgical History:  Procedure Laterality Date  . ORIF SHOULDER FRACTURE Left   . TONSILLECTOMY      Family Psychiatric History: Mother-substance abuse issues  Family History:  Family History  Problem Relation Age of Onset  . Aneurysm Mother   . Stroke Sister   . Seizures Sister   . Lung cancer Maternal Aunt   . Heart attack Paternal Uncle   . Colon cancer Neg Hx   . Esophageal cancer Neg Hx   . Pancreatic cancer Neg Hx   . Stomach cancer Neg Hx     Social History:   Social History   Socioeconomic History  . Marital status: Single    Spouse name: Not on file  . Number of children: Not on file  . Years of education: Not on file  . Highest education level: Not on file  Occupational History  . Not on file  Tobacco Use  . Smoking status: Never Smoker  . Smokeless tobacco: Never Used  Vaping Use  . Vaping Use: Never used  Substance and Sexual Activity  . Alcohol use: No  . Drug use: No  . Sexual activity: Not Currently  Other Topics Concern  . Not on file  Social History Narrative  . Not on file   Social Determinants of Health   Financial Resource Strain: Not on file  Food Insecurity: Not on file  Transportation Needs: Not on file  Physical Activity: Not on file  Stress: Not on file  Social Connections: Not on file    Additional Social History: Lives with sister  Allergies:   Allergies  Allergen Reactions  . Latex Rash    Metabolic Disorder Labs: Lab Results  Component Value Date   HGBA1C 4.5 (L) 03/20/2019   MPG 82.45 03/20/2019   No results found for: PROLACTIN No results found for: CHOL, TRIG, HDL, CHOLHDL, VLDL, LDLCALC Lab Results  Component Value Date   TSH 1.180 12/07/2019    Therapeutic Level Labs: No results found for: LITHIUM No results found for: CBMZ No results found for: VALPROATE  Current Medications: Current Outpatient Medications   Medication Sig Dispense Refill  . DULoxetine (CYMBALTA) 30 MG capsule Take 1 capsule (30 mg total) by mouth 2 (two) times daily. 60 capsule 2  . amLODipine (NORVASC) 10 MG tablet Take 1 tablet (10 mg total) by mouth daily. 30 tablet 0  . ferrous sulfate 325 (65 FE) MG tablet Take 1 tablet (325 mg total) by mouth 2 (two) times daily. With 1/2 glass of orange juice 30 tablet 0  . folic acid (FOLVITE) 1 MG tablet Take 1 tablet (1 mg total) by mouth at bedtime. 30 tablet 0  . hydroxychloroquine (PLAQUENIL) 200 MG tablet Take by mouth daily.    Marland Kitchen lisinopril (ZESTRIL) 20 MG tablet Take 20 mg by mouth daily.     . mirtazapine (REMERON)  15 MG tablet Take 1 tablet (15 mg total) by mouth at bedtime. 30 tablet 2  . Multiple Vitamins-Minerals (MULTIVITAMIN ADULT PO) Take 1 tablet by mouth daily.    Marland Kitchen omeprazole (PRILOSEC) 20 MG capsule Take 1 capsule (20 mg total) by mouth daily. 30 capsule 3  . rivaroxaban (XARELTO) 20 MG TABS tablet Take 1 tablet (20 mg total) by mouth daily with supper. 30 tablet 1  . tacrolimus (PROGRAF) 1 MG capsule Take 1 mg by mouth 2 (two) times daily.     . Vitamin D, Ergocalciferol, (DRISDOL) 50000 units CAPS capsule Take 1 capsule (50,000 Units total) by mouth 2 (two) times a week. 24 capsule 0   No current facility-administered medications for this visit.    Musculoskeletal: Strength & Muscle Tone: within normal limits Gait & Station: normal Patient leans: N/A  Psychiatric Specialty Exam: Review of Systems  Blood pressure (!) 130/92, pulse 99, temperature 98.4 F (36.9 C), temperature source Oral, height 5\' 2"  (1.575 m), weight 142 lb (64.4 kg).Body mass index is 25.97 kg/m.  General Appearance: Fairly Groomed  Eye Contact:  Fair  Speech:  Clear and Coherent and Slow  Volume:  Normal  Mood: Less Depressed  Affect:  Congruent  Thought Process:  Goal Directed and Descriptions of Associations: Intact  Orientation:  Full (Time, Place, and Person)  Thought Content:   Logical  Suicidal Thoughts:  No  Homicidal Thoughts:  No  Memory:  Immediate;   Good Recent;   Good Remote;   Good  Judgement:  Fair  Insight:  Fair  Psychomotor Activity:  Decreased  Concentration:  Concentration: Good and Attention Span: Good  Recall:  Good  Fund of Knowledge:Good  Language: Good  Akathisia:  Negative  Handed:  Right  AIMS (if indicated):  Not done  Assets:  Communication Skills Desire for Improvement Financial Resources/Insurance Housing  ADL's:  Intact  Cognition: WNL  Sleep:  Good    Screenings: PHQ2-9   [facility] Office Visit from 06/24/2020 in [facility] Office Visit from 03/23/2020 in [facility] Office Visit from 01/28/2020 in [facility] Office Visit from 12/11/2019 in [facility] Office Visit from 12/03/2019 in [facility]  PHQ-2 Total Score 0 3 0 1 1  PHQ-9 Total Score -- 16 -- -- --    Flowsheet Row ED from 11/13/2020 in [facility] DEPT  C-SSRS RISK CATEGORY No Risk      Assessment and Plan: Patient seems to be doing better physically however still feels lonely and sad on some days.  She has not tried duloxetine due to some issues with the pharmacy.  We will send a new prescription today and would like for her to try it and see how she does.  1. 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  Dispense: 30 tablet; Refill: 2 - DULoxetine (CYMBALTA) 30 MG capsule; Take 1 capsule (30 mg total) by mouth 2 (two) times daily.  Dispense: 60 capsule; Refill: 2  2. Anxiety  - DULoxetine (CYMBALTA) 30 MG capsule; Take 1 capsule (30 mg total) by mouth 2 (two) times daily.  Dispense: 60 capsule; Refill: 2  Supportive therapy provided during the session.  F/up in 3 months.  Nevada Crane,  MD 4/6/202210:06 AM

## 2020-12-23 ENCOUNTER — Ambulatory Visit (INDEPENDENT_AMBULATORY_CARE_PROVIDER_SITE_OTHER): Payer: Medicaid Other | Admitting: Nurse Practitioner

## 2020-12-23 ENCOUNTER — Encounter: Payer: Self-pay | Admitting: Nurse Practitioner

## 2020-12-23 ENCOUNTER — Other Ambulatory Visit: Payer: Self-pay

## 2020-12-23 VITALS — BP 116/79 | HR 100 | Temp 98.0°F | Ht 62.0 in | Wt 149.0 lb

## 2020-12-23 DIAGNOSIS — M5412 Radiculopathy, cervical region: Secondary | ICD-10-CM

## 2020-12-23 DIAGNOSIS — F0632 Mood disorder due to known physiological condition with major depressive-like episode: Secondary | ICD-10-CM | POA: Diagnosis not present

## 2020-12-23 DIAGNOSIS — I10 Essential (primary) hypertension: Secondary | ICD-10-CM

## 2020-12-23 DIAGNOSIS — M329 Systemic lupus erythematosus, unspecified: Secondary | ICD-10-CM | POA: Diagnosis not present

## 2020-12-23 DIAGNOSIS — G8929 Other chronic pain: Secondary | ICD-10-CM

## 2020-12-23 DIAGNOSIS — M170 Bilateral primary osteoarthritis of knee: Secondary | ICD-10-CM

## 2020-12-23 DIAGNOSIS — M545 Low back pain, unspecified: Secondary | ICD-10-CM

## 2020-12-23 NOTE — Progress Notes (Signed)
[place] [address] [place], [place]  [address] Phone:  [phone]   Fax:  [phone]   Established Patient Office Visit  Subjective:  Patient ID: [name] [name], female    DOB: Mar 28, 1996  Age: 25 y.o. MRN: [id number]  CC:  Chief Complaint  Patient presents with  . Follow-up    6 month follow up     HPI [name] [name] presents for follow up. She  has a past medical history of Accidental methotrexate overdose (01/08/2018), Acute lower UTI, Bronchitis (03/19/2019), Cardiomegaly (05/08/2019), Chronic midline low back pain without sciatica (11/09/2016), Dehydration (03/19/2019), Depo-Provera contraceptive status (08/11/2015), Depression (02/08/2017), Dysphagia (06/01/2018), Dysuria (04/19/2019), Elevated AST (SGOT) (03/19/2019), Essential hypertension (03/19/2019), Facial edema, Gout, Hypertension, IDA (iron deficiency anemia) (08/11/2015), Immunosuppressed status ([place]), Insomnia (01/29/2016), Leukopenia (01/10/2018), Lobar pneumonia ([place]) (06/07/2018), Lupus ([place]), Lupus (systemic lupus erythematosus) ([place]) (11/18/2503), Metabolic acidosis (11/19/7671), Methotrexate toxicity, Mouth ulcers, Mucositis oral (01/12/2018), Odynophagia (06/01/2018), Oral thrush (03/19/2019), Pleuritic chest pain (06/05/2018), Primary osteoarthritis of both knees (11/09/2016), Rash due to systemic lupus erythematosus (SLE) ([place]), Raynaud disease, Raynaud's disease (08/11/2015), Relative acute adrenal insufficiency ([place]) (06/11/2018), Right leg DVT ([place]) (05/08/2019), Seasonal allergies (08/11/2015), Sepsis ([place]) (05/08/2019), Sinus tachycardia (03/19/2019), SLE (systemic lupus erythematosus) ([place]) (09/19/2016), Thrush (06/01/2018), Vaginal candidiasis (01/12/2018), and Vitamin D deficiency (11/09/2016).   She admits that she is in a hurry today related to her transportation. She feels like she is doing well overall. She reports some back pain and is requesting a referral to chiropractor. She has seen them in the past. She felt like  the treatment was beneficial.   Past Medical History:  Diagnosis Date  . Accidental methotrexate overdose 01/08/2018  . Acute lower UTI   . Bronchitis 03/19/2019  . Cardiomegaly 05/08/2019  . Chronic midline low back pain without sciatica 11/09/2016   Facet joint arthropathy  . Dehydration 03/19/2019  . Depo-Provera contraceptive status 08/11/2015  . Depression 02/08/2017  . Dysphagia 06/01/2018  . Dysuria 04/19/2019  . Elevated AST (SGOT) 03/19/2019  . Essential hypertension 03/19/2019  . Facial edema   . Gout   . Hypertension   . IDA (iron deficiency anemia) 08/11/2015  . Immunosuppressed status ([place])   . Insomnia 01/29/2016  . Leukopenia 01/10/2018  . Lobar pneumonia ([place]) 06/07/2018  . Lupus ([place])   . Lupus (systemic lupus erythematosus) ([place]) 03/19/2019  . Metabolic acidosis 12/11/9377  . Methotrexate toxicity   . Mouth ulcers   . Mucositis oral 01/12/2018  . Odynophagia 06/01/2018  . Oral thrush 03/19/2019  . Pleuritic chest pain 06/05/2018  . Primary osteoarthritis of both knees 11/09/2016  . Rash due to systemic lupus erythematosus (SLE) ([place])   . Raynaud disease   . Raynaud's disease 08/11/2015  . Relative acute adrenal insufficiency ([place]) 06/11/2018  . Right leg DVT ([place]) 05/08/2019  . Seasonal allergies 08/11/2015  . Sepsis ([place]) 05/08/2019  . Sinus tachycardia 03/19/2019  . SLE (systemic lupus erythematosus) ([place]) 09/19/2016     Positive ANA, double-stranded DNA, Ro, RNP, RF, history of nasal ulcers, fatigue, anemia, arthritis, Raynauds  . Thrush 06/01/2018  . Vaginal candidiasis 01/12/2018  . Vitamin D deficiency 11/09/2016    Past Surgical History:  Procedure Laterality Date  . ORIF SHOULDER FRACTURE Left   . TONSILLECTOMY      Family History  Problem Relation Age of Onset  . Aneurysm Mother   . Stroke Sister   . Seizures Sister   . Lung cancer Maternal Aunt   .  Heart attack Paternal Uncle   . Colon cancer Neg Hx   . Esophageal cancer Neg Hx   . Pancreatic cancer Neg Hx   .  Stomach cancer Neg Hx     Social History   Socioeconomic History  . Marital status: Single    Spouse name: Not on file  . Number of children: Not on file  . Years of education: Not on file  . Highest education level: Not on file  Occupational History  . Not on file  Tobacco Use  . Smoking status: Never Smoker  . Smokeless tobacco: Never Used  Vaping Use  . Vaping Use: Never used  Substance and Sexual Activity  . Alcohol use: No  . Drug use: No  . Sexual activity: Not Currently  Other Topics Concern  . Not on file  Social History Narrative  . Not on file   Social Determinants of Health   Financial Resource Strain: Not on file  Food Insecurity: Not on file  Transportation Needs: Not on file  Physical Activity: Not on file  Stress: Not on file  Social Connections: Not on file  Intimate Partner Violence: Not on file    Outpatient Medications Prior to Visit  Medication Sig Dispense Refill  . amLODipine (NORVASC) 10 MG tablet Take 1 tablet (10 mg total) by mouth daily. 30 tablet 0  . DULoxetine (CYMBALTA) 30 MG capsule Take 1 capsule (30 mg total) by mouth 2 (two) times daily. 60 capsule 2  . etonogestrel (NEXPLANON) 68 MG IMPL implant 68 mg by Subdermal route.    . ferrous sulfate 325 (65 FE) MG tablet Take 1 tablet (325 mg total) by mouth 2 (two) times daily. With 1/2 glass of orange juice 30 tablet 0  . folic acid (FOLVITE) 1 MG tablet Take 1 tablet (1 mg total) by mouth at bedtime. 30 tablet 0  . hydroxychloroquine (PLAQUENIL) 200 MG tablet Take by mouth daily.    Marland Kitchen lisinopril (ZESTRIL) 20 MG tablet Take 20 mg by mouth daily.     . mirtazapine (REMERON) 15 MG tablet Take 1 tablet (15 mg total) by mouth at bedtime. 30 tablet 2  . Multiple Vitamins-Minerals (MULTIVITAMIN ADULT PO) Take 1 tablet by mouth daily.    Marland Kitchen omeprazole (PRILOSEC) 20 MG capsule Take 1 capsule (20 mg total) by mouth daily. 30 capsule 3  . rivaroxaban (XARELTO) 20 MG TABS tablet Take 1 tablet (20 mg  total) by mouth daily with supper. 30 tablet 1  . tacrolimus (PROGRAF) 1 MG capsule Take 1 mg by mouth 2 (two) times daily.     . Vitamin D, Ergocalciferol, (DRISDOL) 50000 units CAPS capsule Take 1 capsule (50,000 Units total) by mouth 2 (two) times a week. 24 capsule 0   No facility-administered medications prior to visit.    Allergies  Allergen Reactions  . Latex Rash    ROS Review of Systems    Objective:    Physical Exam HENT:     Head: Normocephalic and atraumatic.  Cardiovascular:     Rate and Rhythm: Normal rate and regular rhythm.     Pulses: Normal pulses.     Heart sounds: Normal heart sounds.  Pulmonary:     Effort: Pulmonary effort is normal.     Breath sounds: Normal breath sounds.  Musculoskeletal:     Cervical back: Normal range of motion.     Comments: Unable to fully assess due to patient having to leave the office almost before being seen  Skin:    General: Skin is warm.     Capillary Refill: Capillary refill takes less than 2 seconds.     Comments: unchagned  Neurological:     General: No focal deficit present.     Mental Status: She is alert and oriented to person, place, and time.  Psychiatric:        Mood and Affect: Mood normal.        Behavior: Behavior normal.        Thought Content: Thought content normal.        Judgment: Judgment normal.     BP 116/79 (BP Location: Left Arm, Patient Position: Sitting, Cuff Size: Normal)   Pulse 100   Temp 98 F (36.7 C) (Temporal)   Ht 5\' 2"  (1.575 m)   Wt 149 lb (67.6 kg)   SpO2 97%   BMI 27.25 kg/m  Wt Readings from Last 3 Encounters:  12/23/20 149 lb (67.6 kg)  11/13/20 136 lb 11 oz (62 kg)  11/06/20 136 lb (61.7 kg)     There are no preventive care reminders to display for this patient.  There are no preventive care reminders to display for this patient.  Lab Results  Component Value Date   TSH 1.180 12/07/2019   Lab Results  Component Value Date   WBC 6.5 11/13/2020   HGB 9.4  (L) 11/13/2020   HCT 29.6 (L) 11/13/2020   MCV 79.1 (L) 11/13/2020   PLT 439 (H) 11/13/2020   Lab Results  Component Value Date   NA 142 11/13/2020   K 3.5 11/13/2020   CO2 22 11/13/2020   GLUCOSE 84 11/13/2020   BUN 22 (H) 11/13/2020   CREATININE 1.07 (H) 11/13/2020   BILITOT 0.2 (L) 11/06/2020   ALKPHOS 96 11/06/2020   AST 22 11/06/2020   ALT 12 11/06/2020   PROT 7.7 11/06/2020   ALBUMIN 2.8 (L) 11/06/2020   CALCIUM 9.4 11/13/2020   ANIONGAP 9 11/13/2020   No results found for: CHOL No results found for: HDL No results found for: LDLCALC No results found for: TRIG No results found for: CHOLHDL Lab Results  Component Value Date   HGBA1C 4.5 (L) 03/20/2019      Assessment & Plan:   Problem List Items Addressed This Visit      Cardiovascular and Mediastinum   Essential hypertension Encouraged on going compliance with current medication regimen Encouraged home monitoring and recording BP <130/80 Eating a heart-healthy diet with less salt Encouraged regular physical activity       Nervous and Auditory   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 Stable on current therapy     Musculoskeletal and Integument   Primary osteoarthritis of both knees Persistent    Relevant Orders   Ambulatory referral to Chiropractic     Other   Chronic midline low back pain without sciatica Persistent referral for further evaluation    SLE (systemic lupus erythematosus) ([place]) - Primary (Chronic) Stable continue to follow up with Rheumatology as scheduled    Other Visit Diagnoses    Cervical radiculopathy    History of may also be evaluated by chiropractor as needed    Relevant Orders   Ambulatory referral to Chiropractic      No orders of the defined types were placed in this encounter.   Follow-up: Return in about 6 months (around 06/24/2021).    Vevelyn Francois, NP

## 2020-12-23 NOTE — Patient Instructions (Signed)
Chronic Back Pain When back pain lasts longer than 3 months, it is called chronic back pain. Pain may get worse at certain times (flare-ups). There are things you can do at home to manage your pain. Follow these instructions at home: Pay attention to any changes in your symptoms. Take these actions to help with your pain: Managing pain and stiffness  If told, put ice on the painful area. Your doctor may tell you to use ice for 24-48 hours after the flare-up starts. To do this: ? Put ice in a plastic bag. ? Place a towel between your skin and the bag. ? Leave the ice on for 20 minutes, 2-3 times a day.  If told, put heat on the painful area. Do this as often as told by your doctor. Use the heat source that your doctor recommends, such as a moist heat pack or a heating pad. ? Place a towel between your skin and the heat source. ? Leave the heat on for 20-30 minutes. ? Take off the heat if your skin turns bright red. This is especially important if you are unable to feel pain, heat, or cold. You may have a greater risk of getting burned.  Soak in a warm bath. This can help relieve pain.      Activity  Avoid bending and other activities that make pain worse.  When standing: ? Keep your upper back and neck straight. ? Keep your shoulders pulled back. ? Avoid slouching.  When sitting: ? Keep your back straight. ? Relax your shoulders. Do not round your shoulders or pull them backward.  Do not sit or stand in one place for long periods of time.  Take short rest breaks during the day. Lying down or standing is usually better than sitting. Resting can help relieve pain.  When sitting or lying down for a long time, do some mild activity or stretching. This will help to prevent stiffness and pain.  Get regular exercise. Ask your doctor what activities are safe for you.  Do not lift anything that is heavier than 10 lb (4.5 kg) or the limit that you are told, until your doctor says that it  is safe.  To prevent injury when you lift things: ? Bend your knees. ? Keep the weight close to your body. ? Avoid twisting.  Sleep on a firm mattress. Try lying on your side with your knees slightly bent. If you lie on your back, put a pillow under your knees.   Medicines  Treatment may include medicines for pain and swelling taken by mouth or put on the skin, prescription pain medicine, or muscle relaxants.  Take over-the-counter and prescription medicines only as told by your doctor.  Ask your doctor if the medicine prescribed to you: ? Requires you to avoid driving or using machinery. ? Can cause trouble pooping (constipation). You may need to take these actions to prevent or treat trouble pooping:  Drink enough fluid to keep your pee (urine) pale yellow.  Take over-the-counter or prescription medicines.  Eat foods that are high in fiber. These include beans, whole grains, and fresh fruits and vegetables.  Limit foods that are high in fat and sugars. These include fried or sweet foods. General instructions  Do not use any products that contain nicotine or tobacco, such as cigarettes, e-cigarettes, and chewing tobacco. If you need help quitting, ask your doctor.  Keep all follow-up visits as told by your doctor. This is important. Contact a doctor if:    Your pain does not get better with rest or medicine.  Your pain gets worse, or you have new pain.  You have a high fever.  You lose weight very quickly.  You have trouble doing your normal activities. Get help right away if:  One or both of your legs or feet feel weak.  One or both of your legs or feet lose feeling (have numbness).  You have trouble controlling when you poop (have a bowel movement) or pee (urinate).  You have bad back pain and: ? You feel like you may vomit (nauseous), or you vomit. ? You have pain in your belly (abdomen). ? You have shortness of breath. ? You faint. Summary  When back pain  lasts longer than 3 months, it is called chronic back pain.  Pain may get worse at certain times (flare-ups).  Use ice and heat as told by your doctor. Your doctor may tell you to use ice after flare-ups. This information is not intended to replace advice given to you by your health care provider. Make sure you discuss any questions you have with your health care provider. Document Revised: 10/09/2019 Document Reviewed: 10/09/2019 Elsevier Patient Education  2021 [employer]  

## 2021-01-05 ENCOUNTER — Inpatient Hospital Stay: Payer: Medicaid Other | Attending: Family

## 2021-01-05 ENCOUNTER — Inpatient Hospital Stay (HOSPITAL_BASED_OUTPATIENT_CLINIC_OR_DEPARTMENT_OTHER): Payer: Medicaid Other | Admitting: Hematology & Oncology

## 2021-01-05 ENCOUNTER — Encounter: Payer: Self-pay | Admitting: Hematology & Oncology

## 2021-01-05 ENCOUNTER — Telehealth: Payer: Self-pay | Admitting: *Deleted

## 2021-01-05 ENCOUNTER — Other Ambulatory Visit: Payer: Self-pay

## 2021-01-05 VITALS — BP 104/82 | HR 99 | Temp 98.8°F | Resp 18 | Wt 141.2 lb

## 2021-01-05 DIAGNOSIS — D500 Iron deficiency anemia secondary to blood loss (chronic): Secondary | ICD-10-CM

## 2021-01-05 DIAGNOSIS — M329 Systemic lupus erythematosus, unspecified: Secondary | ICD-10-CM | POA: Insufficient documentation

## 2021-01-05 DIAGNOSIS — Z86711 Personal history of pulmonary embolism: Secondary | ICD-10-CM | POA: Diagnosis not present

## 2021-01-05 DIAGNOSIS — D631 Anemia in chronic kidney disease: Secondary | ICD-10-CM | POA: Insufficient documentation

## 2021-01-05 DIAGNOSIS — M320 Drug-induced systemic lupus erythematosus: Secondary | ICD-10-CM

## 2021-01-05 DIAGNOSIS — I2699 Other pulmonary embolism without acute cor pulmonale: Secondary | ICD-10-CM

## 2021-01-05 DIAGNOSIS — Z7982 Long term (current) use of aspirin: Secondary | ICD-10-CM | POA: Diagnosis not present

## 2021-01-05 DIAGNOSIS — I7300 Raynaud's syndrome without gangrene: Secondary | ICD-10-CM | POA: Insufficient documentation

## 2021-01-05 DIAGNOSIS — Z86718 Personal history of other venous thrombosis and embolism: Secondary | ICD-10-CM | POA: Diagnosis not present

## 2021-01-05 DIAGNOSIS — K219 Gastro-esophageal reflux disease without esophagitis: Secondary | ICD-10-CM | POA: Diagnosis not present

## 2021-01-05 DIAGNOSIS — N189 Chronic kidney disease, unspecified: Secondary | ICD-10-CM | POA: Insufficient documentation

## 2021-01-05 DIAGNOSIS — M3212 Pericarditis in systemic lupus erythematosus: Secondary | ICD-10-CM

## 2021-01-05 LAB — CMP (CANCER CENTER ONLY)
ALT: 18 U/L (ref 0–44)
AST: 25 U/L (ref 15–41)
Albumin: 3 g/dL — ABNORMAL LOW (ref 3.5–5.0)
Alkaline Phosphatase: 87 U/L (ref 38–126)
Anion gap: 5 (ref 5–15)
BUN: 15 mg/dL (ref 6–20)
CO2: 23 mmol/L (ref 22–32)
Calcium: 9.2 mg/dL (ref 8.9–10.3)
Chloride: 108 mmol/L (ref 98–111)
Creatinine: 0.88 mg/dL (ref 0.44–1.00)
GFR, Estimated: >60 mL/min (ref >60)
Glucose, Bld: 84 mg/dL (ref 70–99)
Potassium: 4.1 mmol/L (ref 3.5–5.1)
Sodium: 136 mmol/L (ref 135–145)
Total Bilirubin: 0.2 mg/dL — ABNORMAL LOW (ref 0.3–1.2)
Total Protein: 8.2 g/dL — ABNORMAL HIGH (ref 6.5–8.1)

## 2021-01-05 LAB — CBC WITH DIFFERENTIAL (CANCER CENTER ONLY)
Abs Immature Granulocytes: 0.06 10*3/uL (ref 0.00–0.07)
Basophils Absolute: 0 10*3/uL (ref 0.0–0.1)
Basophils Relative: 0 %
Eosinophils Absolute: 0.1 10*3/uL (ref 0.0–0.5)
Eosinophils Relative: 2 %
HCT: 34.8 % — ABNORMAL LOW (ref 36.0–46.0)
Hemoglobin: 11.2 g/dL — ABNORMAL LOW (ref 12.0–15.0)
Immature Granulocytes: 1 %
Lymphocytes Relative: 35 %
Lymphs Abs: 2 10*3/uL (ref 0.7–4.0)
MCH: 27.1 pg (ref 26.0–34.0)
MCHC: 32.2 g/dL (ref 30.0–36.0)
MCV: 84.1 fL (ref 80.0–100.0)
Monocytes Absolute: 0.3 10*3/uL (ref 0.1–1.0)
Monocytes Relative: 5 %
Neutro Abs: 3.1 10*3/uL (ref 1.7–7.7)
Neutrophils Relative %: 57 %
Platelet Count: 357 10*3/uL (ref 150–400)
RBC: 4.14 MIL/uL (ref 3.87–5.11)
RDW: 17.6 % — ABNORMAL HIGH (ref 11.5–15.5)
WBC Count: 5.6 10*3/uL (ref 4.0–10.5)
nRBC: 0 % (ref 0.0–0.2)

## 2021-01-05 LAB — RETICULOCYTES
Immature Retic Fract: 8.3 % (ref 2.3–15.9)
RBC.: 4.1 MIL/uL (ref 3.87–5.11)
Retic Count, Absolute: 63.6 10*3/uL (ref 19.0–186.0)
Retic Ct Pct: 1.6 % (ref 0.4–3.1)

## 2021-01-05 LAB — D-DIMER, QUANTITATIVE: D-Dimer, Quant: 6.43 ug/mL-FEU — ABNORMAL HIGH (ref 0.00–0.50)

## 2021-01-05 NOTE — Progress Notes (Signed)
Hematology and Oncology Follow Up Visit  [name] [name] [id number] 23-Apr-1996 25 y.o. 01/05/2021   Principle Diagnosis:  DVT of the right lower extremity by [place] and VQ scan indeterminate of PE in August/September 2020 Lupus anticoagulant positive Iron deficiency   Current Therapy:        Oral iron supplement daily   EC ASA 162 mg po q day -- start on 11/06/2020   Interim History:  [name] [name] is here today for follow-up.  She is actually doing pretty well.  She did stop her Xarelto.  She stopped it herself.  This was back a couple months ago from what she said.  She said that since there is no blood clot in her leg, she would stop the Xarelto.  She has had no flareups of the lupus.  Sounds like she may have some Raynaud's going on right now.  I do not think it would be a bad idea to have her on baby aspirin.  I will see no reason why she could not take baby aspirin.  I told her to take coated aspirin 162 mg daily with food.  I see that she does have GERD.  We will have to watch out with this.  She is quite anemic today.  I suspect that her iron is going to be low.  We will have to see what her iron levels are.  I suspect she probably is going to need IV iron.  There has been no fever.  She has been avoiding the coronavirus which is certainly important.  Is been no change in bowel or bladder habits.  She has had no leg swelling.  Overall, her performance status is ECOG 1.    Medications:  Allergies as of 01/05/2021       Reactions   Latex Rash        Medication List        Accurate as of January 05, 2021 12:36 PM. If you have any questions, ask your nurse or doctor.          amLODipine 10 MG tablet Commonly known as: NORVASC Take 1 tablet (10 mg total) by mouth daily.   DULoxetine 30 MG capsule Commonly known as: Cymbalta Take 1 capsule (30 mg total) by mouth 2 (two) times daily.   ferrous sulfate 325 (65 FE) MG tablet Take 1 tablet (325 mg total) by mouth 2 (two)  times daily. With 1/2 glass of orange juice   folic acid 1 MG tablet Commonly known as: FOLVITE Take 1 tablet (1 mg total) by mouth at bedtime.   HYDROmorphone 2 MG tablet Commonly known as: DILAUDID Take 2 mg by mouth 2 (two) times daily as needed.   hydroxychloroquine 200 MG tablet Commonly known as: PLAQUENIL Take by mouth daily.   lisinopril 20 MG tablet Commonly known as: ZESTRIL Take 20 mg by mouth daily.   mirtazapine 15 MG tablet Commonly known as: Remeron Take 1 tablet (15 mg total) by mouth at bedtime.   MULTIVITAMIN ADULT PO Take 1 tablet by mouth daily.   Nexplanon 68 MG Impl implant Generic drug: etonogestrel 68 mg by Subdermal route.   omeprazole 20 MG capsule Commonly known as: PRILOSEC Take 1 capsule (20 mg total) by mouth daily.   rivaroxaban 20 MG Tabs tablet Commonly known as: XARELTO Take 1 tablet (20 mg total) by mouth daily with supper.   tacrolimus 1 MG capsule Commonly known as: PROGRAF Take 1 mg by mouth 2 (two) times daily.  Vitamin D (Ergocalciferol) 1.25 MG (50000 UNIT) Caps capsule Commonly known as: DRISDOL Take 1 capsule (50,000 Units total) by mouth 2 (two) times a week.        Allergies:  Allergies  Allergen Reactions   Latex Rash    Past Medical History, Surgical history, Social history, and Family History were reviewed and updated.  Review of Systems: Review of Systems  Constitutional: Negative.   HENT: Negative.   Eyes: Negative.   Respiratory: Negative.   Cardiovascular: Negative.   Gastrointestinal: Negative.   Genitourinary: Negative.   Musculoskeletal: Negative.   Skin: Positive for rash.  Neurological: Negative.   Endo/Heme/Allergies: Negative.   Psychiatric/Behavioral: Negative.      Physical Exam:  weight is 141 lb 4 oz (64.1 kg). Her oral temperature is 98.8 F (37.1 C). Her blood pressure is 104/82 and her pulse is 99. Her respiration is 18 and oxygen saturation is 99%.   Wt Readings from Last  3 Encounters:  01/05/21 141 lb 4 oz (64.1 kg)  12/23/20 149 lb (67.6 kg)  11/13/20 136 lb 11 oz (62 kg)    Physical Exam Vitals reviewed.  HENT:     Head: Normocephalic and atraumatic.  Eyes:     Pupils: Pupils are equal, round, and reactive to light.  Cardiovascular:     Rate and Rhythm: Normal rate and regular rhythm.     Heart sounds: Normal heart sounds.  Pulmonary:     Effort: Pulmonary effort is normal.     Breath sounds: Normal breath sounds.  Abdominal:     General: Bowel sounds are normal.     Palpations: Abdomen is soft.  Musculoskeletal:        General: No tenderness or deformity. Normal range of motion.     Cervical back: Normal range of motion.  Lymphadenopathy:     Cervical: No cervical adenopathy.  Skin:    General: Skin is warm and dry.     Findings: No erythema or rash.  Neurological:     Mental Status: She is alert and oriented to person, place, and time.  Psychiatric:        Behavior: Behavior normal.        Thought Content: Thought content normal.        Judgment: Judgment normal.    Lab Results  Component Value Date   WBC 5.6 01/05/2021   HGB 11.2 (L) 01/05/2021   HCT 34.8 (L) 01/05/2021   MCV 84.1 01/05/2021   PLT 357 01/05/2021   Lab Results  Component Value Date   FERRITIN 56 11/06/2020   IRON 18 (L) 11/06/2020   TIBC 265 11/06/2020   UIBC 247 11/06/2020   IRONPCTSAT 7 (L) 11/06/2020   Lab Results  Component Value Date   RETICCTPCT 1.6 01/05/2021   RBC 4.10 01/05/2021   No results found for: KPAFRELGTCHN, LAMBDASER, KAPLAMBRATIO No results found for: IGGSERUM, IGA, IGMSERUM No results found for: Odetta Pink, SPEI   Chemistry      Component Value Date/Time   NA 136 01/05/2021 1120   NA 140 12/03/2019 1449   K 4.1 01/05/2021 1120   CL 108 01/05/2021 1120   CO2 23 01/05/2021 1120   BUN 15 01/05/2021 1120   BUN 11 12/03/2019 1449   CREATININE 0.88 01/05/2021 1120    CREATININE 0.57 12/11/2017 1018      Component Value Date/Time   CALCIUM 9.2 01/05/2021 1120   ALKPHOS 87 01/05/2021 1120   AST  25 01/05/2021 1120   ALT 18 01/05/2021 1120   BILITOT 0.2 (L) 01/05/2021 1120       Impression and Plan: [name] [name] is a very pleasant 25 yo African American female with history of systemic lupus erythematosus and Raynaud's. She was diagnosed with right lower extremity DVT and VQ scan was indeterminate for PE back in August/September 2020.  Again, she is off Xarelto.  She stopped this herself.  We will get her on baby aspirin.  I think this would be very reasonable.  I am not sure why she did not get IV iron when she was here last.  We will try to get her set up for this again.  I have to believe that her iron is going to be quite low.  I really do want to get her back in 2 months.  I think she needs close follow-up for all of her problems.  I want to make sure that we help her hemoglobin.  Of note, she does have a low erythropoietin level.  Back in 2020 her erythropoietin level was only 13.  As such, she may need ESA although this might be difficult to give given her history of thromboembolic disease.  Last time she was here, she did not have a lupus anticoagulant in the blood.    Volanda Napoleon, MD 4/26/202212:36 PM

## 2021-01-05 NOTE — Telephone Encounter (Signed)
Per 01/05/21 los gave patient upcoming appointments - view mychart

## 2021-01-06 LAB — LUPUS ANTICOAGULANT PANEL
DRVVT: 37.1 s (ref 0.0–47.0)
PTT Lupus Anticoagulant: 39.4 s (ref 0.0–51.9)

## 2021-01-06 LAB — FERRITIN: Ferritin: 472 ng/mL — ABNORMAL HIGH (ref 11–307)

## 2021-01-06 LAB — ERYTHROPOIETIN: Erythropoietin: 4.7 m[IU]/mL (ref 2.6–18.5)

## 2021-01-06 LAB — CARDIOLIPIN ANTIBODIES, IGG, IGM, IGA
Anticardiolipin IgA: <9 APL U/mL (ref 0–11)
Anticardiolipin IgG: 9 GPL U/mL (ref 0–14)
Anticardiolipin IgM: 42 MPL U/mL — ABNORMAL HIGH (ref 0–12)

## 2021-01-06 LAB — IRON AND TIBC
Iron: 50 ug/dL (ref 41–142)
Saturation Ratios: 24 % (ref 21–57)
TIBC: 206 ug/dL — ABNORMAL LOW (ref 236–444)
UIBC: 156 ug/dL (ref 120–384)

## 2021-02-03 ENCOUNTER — Other Ambulatory Visit: Payer: Medicaid Other

## 2021-02-03 ENCOUNTER — Ambulatory Visit: Payer: Medicaid Other | Admitting: Family

## 2021-02-03 ENCOUNTER — Ambulatory Visit: Payer: Medicaid Other | Admitting: Hematology & Oncology

## 2021-02-08 ENCOUNTER — Other Ambulatory Visit: Payer: Self-pay

## 2021-02-08 ENCOUNTER — Emergency Department (HOSPITAL_COMMUNITY): Payer: Medicaid Other

## 2021-02-08 ENCOUNTER — Emergency Department (HOSPITAL_COMMUNITY)
Admission: EM | Admit: 2021-02-08 | Discharge: 2021-02-08 | Disposition: A | Discharge status: Home / Self Care | Payer: Medicaid Other | Attending: Emergency Medicine | Admitting: Emergency Medicine

## 2021-02-08 ENCOUNTER — Encounter (HOSPITAL_COMMUNITY): Payer: Self-pay | Admitting: *Deleted

## 2021-02-08 DIAGNOSIS — I10 Essential (primary) hypertension: Secondary | ICD-10-CM | POA: Insufficient documentation

## 2021-02-08 DIAGNOSIS — R0981 Nasal congestion: Secondary | ICD-10-CM | POA: Diagnosis present

## 2021-02-08 DIAGNOSIS — Z91040 Latex allergy status: Secondary | ICD-10-CM | POA: Insufficient documentation

## 2021-02-08 DIAGNOSIS — Z7901 Long term (current) use of anticoagulants: Secondary | ICD-10-CM | POA: Insufficient documentation

## 2021-02-08 DIAGNOSIS — J181 Lobar pneumonia, unspecified organism: Secondary | ICD-10-CM | POA: Insufficient documentation

## 2021-02-08 DIAGNOSIS — Z20822 Contact with and (suspected) exposure to covid-19: Secondary | ICD-10-CM | POA: Insufficient documentation

## 2021-02-08 DIAGNOSIS — J189 Pneumonia, unspecified organism: Secondary | ICD-10-CM

## 2021-02-08 DIAGNOSIS — Z79899 Other long term (current) drug therapy: Secondary | ICD-10-CM | POA: Insufficient documentation

## 2021-02-08 LAB — SARS CORONAVIRUS 2 (TAT 6-24 HRS): SARS Coronavirus 2: NEGATIVE

## 2021-02-08 MED ORDER — AMOXICILLIN 500 MG PO CAPS: 1000.0000 mg | ORAL_CAPSULE | Freq: Three times a day (TID) | ORAL | 0 refills | Status: DC

## 2021-02-08 MED ORDER — BENZONATATE 100 MG PO CAPS: 100.0000 mg | ORAL_CAPSULE | Freq: Three times a day (TID) | ORAL | 0 refills | Status: DC

## 2021-02-08 MED ORDER — AZITHROMYCIN 250 MG PO TABS: 250.0000 mg | ORAL_TABLET | Freq: Every day | ORAL | 0 refills | Status: DC

## 2021-02-08 MED ORDER — AMOXICILLIN 500 MG PO CAPS: 1000.0000 mg | ORAL_CAPSULE | Freq: Three times a day (TID) | ORAL | 0 refills | Status: AC

## 2021-02-08 MED ORDER — AZITHROMYCIN 250 MG PO TABS
500.0000 mg | ORAL_TABLET | Freq: Once | ORAL | Status: AC
  Administered 2021-02-08: 500 mg via ORAL
  Filled 2021-02-08: qty 2

## 2021-02-08 MED ORDER — AMOXICILLIN 500 MG PO CAPS
1000.0000 mg | ORAL_CAPSULE | Freq: Once | ORAL | Status: AC
  Administered 2021-02-08: 1000 mg via ORAL
  Filled 2021-02-08: qty 2

## 2021-02-08 NOTE — ED Provider Notes (Signed)
[facility] DEPT Provider Note   CSN: [id number] Arrival date & time: 02/08/21  6256     History Chief Complaint  Patient presents with  . Cough    [name] [name] is a 25 y.o. female.  Patient with history of lupus on immunocompromising medications, history of anticoagulation, history of pneumonia --presents emergency department for evaluation of congestion, cough productive of green sputum over the past 3 to 4 days.  Patient reports that the cough is worse when she talks.  She denies ear pain.  She has had a runny nose.  No sore throat.  No chest pain or shortness of breath.  She denies having fevers, abdominal pain, vomiting or diarrhea.  Positive COVID test 3 months ago.  No known sick contacts.  Onset of symptoms acute.  Course is constant.        Past Medical History:  Diagnosis Date  . Accidental methotrexate overdose 01/08/2018  . Acute lower UTI   . Bronchitis 03/19/2019  . Cardiomegaly 05/08/2019  . Chronic midline low back pain without sciatica 11/09/2016   Facet joint arthropathy  . Dehydration 03/19/2019  . Depo-Provera contraceptive status 08/11/2015  . Depression 02/08/2017  . Dysphagia 06/01/2018  . Dysuria 04/19/2019  . Elevated AST (SGOT) 03/19/2019  . Essential hypertension 03/19/2019  . Facial edema   . Gout   . Hypertension   . IDA (iron deficiency anemia) 08/11/2015  . Immunosuppressed status ([place])   . Insomnia 01/29/2016  . Leukopenia 01/10/2018  . Lobar pneumonia ([place]) 06/07/2018  . Lupus ([place])   . Lupus (systemic lupus erythematosus) ([place]) 03/19/2019  . Metabolic acidosis 11/18/9371  . Methotrexate toxicity   . Mouth ulcers   . Mucositis oral 01/12/2018  . Odynophagia 06/01/2018  . Oral thrush 03/19/2019  . Pleuritic chest pain 06/05/2018  . Primary osteoarthritis of both knees 11/09/2016  . Rash due to systemic lupus erythematosus (SLE) ([place])   . Raynaud disease   . Raynaud's disease 08/11/2015  . Relative acute adrenal insufficiency  ([place]) 06/11/2018  . Right leg DVT ([place]) 05/08/2019  . Seasonal allergies 08/11/2015  . Sepsis ([place]) 05/08/2019  . Sinus tachycardia 03/19/2019  . SLE (systemic lupus erythematosus) ([place]) 09/19/2016     Positive ANA, double-stranded DNA, Ro, RNP, RF, history of nasal ulcers, fatigue, anemia, arthritis, Raynauds  . Thrush 06/01/2018  . Vaginal candidiasis 01/12/2018  . Vitamin D deficiency 11/09/2016    Patient Active Problem List   Diagnosis Date Noted  . 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 05/27/2020  . Anxiety 05/27/2020  . PVD (peripheral vascular disease) ([place]) 02/18/2020  . Exacerbation of systemic lupus erythematosus ([place]) 12/06/2019  . Pulmonary embolus ([place]) 05/28/2019  . Sepsis ([place]) 05/08/2019  . Right leg DVT ([place]) 05/08/2019  . Cardiomegaly 05/08/2019  . Dysuria 04/19/2019  . Facial edema   . Rash due to systemic lupus erythematosus (SLE) ([place])   . Immunosuppressed status ([place])   . Acute lower UTI   . 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 Lupus (systemic lupus erythematosus) ([place]) 03/19/2019  . Bronchitis 03/19/2019  . Essential hypertension 03/19/2019  . Elevated AST (SGOT) 03/19/2019  . Dehydration 03/19/2019  . Sinus tachycardia 03/19/2019  . Metabolic acidosis 42/87/6811  . Oral thrush 03/19/2019  . Relative acute adrenal insufficiency ([place]) 06/11/2018  . Anemia 06/09/2018  . Lobar pneumonia ([place]) 06/07/2018  . Pleuritic chest pain 06/05/2018  . Dysphagia 06/01/2018  .  Odynophagia 06/01/2018  . Thrush 06/01/2018  . Mucositis oral 01/12/2018  . Vaginal candidiasis 01/12/2018  . Methotrexate toxicity   . Mouth ulcers   . Leukopenia 01/10/2018  . Accidental methotrexate overdose 01/08/2018  . Depression 02/08/2017  . Primary osteoarthritis of both knees 11/09/2016  . Chronic midline low back pain without sciatica 11/09/2016  . Vitamin D deficiency 11/09/2016   . SLE (systemic lupus erythematosus) ([place]) 09/19/2016  . Insomnia 01/29/2016  . Depo-Provera contraceptive status 08/11/2015  . Raynaud's disease 08/11/2015  . Seasonal allergies 08/11/2015  . IDA (iron deficiency anemia) 08/11/2015    Past Surgical History:  Procedure Laterality Date  . ORIF SHOULDER FRACTURE Left   . TONSILLECTOMY       OB History   No obstetric history on file.     Family History  Problem Relation Age of Onset  . Aneurysm Mother   . Stroke Sister   . Seizures Sister   . Lung cancer Maternal Aunt   . Heart attack Paternal Uncle   . Colon cancer Neg Hx   . Esophageal cancer Neg Hx   . Pancreatic cancer Neg Hx   . Stomach cancer Neg Hx     Social History   Tobacco Use  . Smoking status: Never Smoker  . Smokeless tobacco: Never Used  Vaping Use  . Vaping Use: Never used  Substance Use Topics  . Alcohol use: No  . Drug use: No    Home Medications Prior to Admission medications   Medication Sig Start Date End Date Taking? Authorizing Provider  amLODipine (NORVASC) 10 MG tablet Take 1 tablet (10 mg total) by mouth daily. 03/26/19   Kayleen Memos, DO  DULoxetine (CYMBALTA) 30 MG capsule Take 1 capsule (30 mg total) by mouth 2 (two) times daily. 12/16/20   [place] Crane, MD  etonogestrel (NEXPLANON) 68 MG IMPL implant 68 mg by Subdermal route. 06/25/20 06/13/23  [provider]  ferrous sulfate 325 (65 FE) MG tablet Take 1 tablet (325 mg total) by mouth 2 (two) times daily. With 1/2 glass of orange juice 12/15/15   Davonna Belling, MD  folic acid (FOLVITE) 1 MG tablet Take 1 tablet (1 mg total) by mouth at bedtime. 03/25/19   Kayleen Memos, DO  HYDROmorphone (DILAUDID) 2 MG tablet Take 2 mg by mouth 2 (two) times daily as needed. 12/31/20   [provider]  hydroxychloroquine (PLAQUENIL) 200 MG tablet Take by mouth daily.    [provider]  lisinopril (ZESTRIL) 20 MG tablet Take 20 mg by mouth daily.  06/18/19   [provider]  mirtazapine (REMERON) 15 MG tablet Take 1 tablet (15 mg total) by mouth at bedtime. 12/16/20   [place] Crane, MD  Multiple Vitamins-Minerals (MULTIVITAMIN ADULT PO) Take 1 tablet by mouth daily.    [provider]  omeprazole (PRILOSEC) 20 MG capsule Take 1 capsule (20 mg total) by mouth daily. 12/24/19   Dorena Dew, FNP  rivaroxaban (XARELTO) 20 MG TABS tablet Take 1 tablet (20 mg total) by mouth daily with supper. 11/12/20   Volanda Napoleon, MD  tacrolimus (PROGRAF) 1 MG capsule Take 1 mg by mouth 2 (two) times daily.  07/25/19   [provider]  Vitamin D, Ergocalciferol, (DRISDOL) 50000 units CAPS capsule Take 1 capsule (50,000 Units total) by mouth 2 (two) times a week. 12/21/17   Bo Merino, MD    Allergies    Latex  Review of Systems   Review of  Systems  Constitutional: Negative for chills, fatigue and fever.  HENT: Positive for congestion and rhinorrhea. Negative for ear pain, sinus pressure and sore throat.   Eyes: Negative for redness.  Respiratory: Positive for cough. Negative for wheezing.   Gastrointestinal: Negative for abdominal pain, diarrhea, nausea and vomiting.  Genitourinary: Negative for dysuria.  Musculoskeletal: Negative for myalgias and neck stiffness.  Skin: Negative for rash.  Neurological: Negative for headaches.  Hematological: Negative for adenopathy.    Physical Exam Updated Vital Signs BP 130/87 (BP Location: Left Arm)   Pulse (!) 114   Temp 98.9 F (37.2 C) (Oral)   Resp 17   Ht 5\' 3"  (1.6 m)   Wt 68 kg   SpO2 99%   BMI 26.57 kg/m   Physical Exam Vitals and nursing note reviewed.  Constitutional:      Appearance: She is well-developed.  HENT:     Head: Normocephalic and atraumatic.     Jaw: No trismus.     Right Ear: Tympanic membrane, ear canal and external ear normal.     Left Ear: Tympanic membrane, ear canal and external ear normal.     Nose: Congestion and rhinorrhea present. No mucosal  edema.     Mouth/Throat:     Mouth: Mucous membranes are not dry. No oral lesions.     Pharynx: Uvula midline. No oropharyngeal exudate, posterior oropharyngeal erythema or uvula swelling.     Tonsils: No tonsillar abscesses.  Eyes:     General:        Right eye: No discharge.        Left eye: No discharge.     Conjunctiva/sclera: Conjunctivae normal.  Cardiovascular:     Rate and Rhythm: Normal rate and regular rhythm.     Heart sounds: Normal heart sounds.  Pulmonary:     Effort: Pulmonary effort is normal. No respiratory distress.     Breath sounds: Normal breath sounds. No wheezing or rales.     Comments: Lungs clear to auscultation bilaterally Abdominal:     Palpations: Abdomen is soft.     Tenderness: There is no abdominal tenderness.  Musculoskeletal:     Cervical back: Normal range of motion and neck supple.  Lymphadenopathy:     Cervical: No cervical adenopathy.  Skin:    General: Skin is warm and dry.  Neurological:     Mental Status: She is alert.     ED Results / Procedures / Treatments   Labs (all labs ordered are listed, but only abnormal results are displayed) Labs Reviewed  SARS CORONAVIRUS 2 (TAT 6-24 HRS)    EKG None  Radiology DG Chest Port 1 View  Result Date: 02/08/2021 CLINICAL DATA:  25 year old female with history of nasal congestion and cough for the past 4 days. EXAM: PORTABLE CHEST 1 VIEW COMPARISON:  Chest x-ray 12/06/2019. FINDINGS: Ill-defined opacity in the periphery of the left lung base may reflect developing bronchopneumonia. Right lung is clear. No pleural effusions. No pneumothorax. No suspicious appearing pulmonary nodules or masses are noted. No evidence of pulmonary edema. Heart size is normal. Upper mediastinal contours are within normal limits. IMPRESSION: 1. Findings are concerning for bronchopneumonia in the left lung base either in the left lower lobe or lingula. Followup PA and lateral chest X-ray is recommended in 3-4 weeks  following trial of antibiotic therapy to ensure resolution and exclude underlying malignancy. Electronically Signed   By: Vinnie Langton M.D.   On: 02/08/2021 10:31    Procedures Procedures  Medications Ordered in ED Medications  amoxicillin (AMOXIL) capsule 1,000 mg (1,000 mg Oral Given 02/08/21 1125)  azithromycin (ZITHROMAX) tablet 500 mg (500 mg Oral Given 02/08/21 1125)    ED Course  I have reviewed the triage vital signs and the nursing notes.  Pertinent labs & imaging results that were available during my care of the patient were reviewed by me and considered in my medical decision making (see chart for details).  Patient seen and examined.  Given history of lupus, will check chest x-ray and send COVID testing.  She is right at the 61-month mark in regards to a positive test, however she is higher risk and would like to recheck if any symptoms.  Otherwise, patient looks very well, in no distress.  Lungs are clear bilaterally.  Vital signs reviewed and are as follows: BP 130/87 (BP Location: Left Arm)   Pulse (!) 114   Temp 98.9 F (37.2 C) (Oral)   Resp 17   Ht 5\' 3"  (1.6 m)   Wt 68 kg   SpO2 99%   BMI 26.57 kg/m   11:29 AM x-ray reviewed.  Concern for left lower lobe pneumonia.  Patient appears well, and is not hypoxic.  Do not feel that she requires further work-up today.  COVID testing was sent and is pending.  Patient started on amoxicillin and azithromycin.  Will give Tessalon for cough.  She will be discharged with strict return instructions.  She is asked to return if she develops worsening cough or shortness of breath, high fever, persistent vomiting.  Otherwise, encouraged follow-up with her provider in 48 hours for reassessment.    MDM Rules/Calculators/A&P                          Patient with congestion and cough over the past several days.  COVID testing pending.  X-ray is consistent with left lower lobe pneumonia.  Treatment as above.  She looks well,  nontoxic.   Final Clinical Impression(s) / ED Diagnoses Final diagnoses:  Community acquired pneumonia of left lower lobe of lung    Rx / DC Orders ED Discharge Orders         Ordered    amoxicillin (AMOXIL) 500 MG capsule  3 times daily        02/08/21 1128    azithromycin (ZITHROMAX) 250 MG tablet  Daily        02/08/21 1128    benzonatate (TESSALON) 100 MG capsule  Every 8 hours        02/08/21 1128           Carlisle Cater, PA-C 02/08/21 1130    Lacretia Leigh, MD 02/12/21 [phone]

## 2021-02-08 NOTE — ED Triage Notes (Signed)
4 days cough and congestion, productive cough, green secretions.

## 2021-02-08 NOTE — Discharge Instructions (Signed)
Please read and follow all provided instructions.  Your diagnoses today include:  1. Community acquired pneumonia of left lower lobe of lung     Tests performed today include:  Chest x-ray -- shows pneumonia  Vital signs. See below for your results today.   Medications prescribed:   Amoxicillin - antibiotic  You have been prescribed an antibiotic medicine: take the entire course of medicine even if you are feeling better. Stopping early can cause the antibiotic not to work.   Azithromycin - antibiotic for respiratory infection  You have been prescribed an antibiotic medicine: take the entire course of medicine even if you are feeling better. Stopping early can cause the antibiotic not to work.   Tessalon Perles - cough suppressant medication  Take any prescribed medications only as directed.  Home care instructions:  Follow any educational materials contained in this packet.  Take the complete course of antibiotics that you were prescribed.   BE VERY CAREFUL not to take multiple medicines containing Tylenol (also called acetaminophen). Doing so can lead to an overdose which can damage your liver and cause liver failure and possibly death.   Follow-up instructions: Please follow-up with your primary care provider in the next 3 days for further evaluation of your symptoms and to ensure resolution of your infection.   Return instructions:   Please return to the Emergency Department if you experience worsening symptoms.   Return immediately with worsening breathing, worsening shortness of breath, or if you feel it is taking you more effort to breathe.   Please return if you have any other emergent concerns.  Additional Information:  Your vital signs today were: BP 130/87 (BP Location: Left Arm)   Pulse (!) 114   Temp 98.9 F (37.2 C) (Oral)   Resp 17   Ht 5\' 3"  (1.6 m)   Wt 68 kg   SpO2 99%   BMI 26.57 kg/m  If your blood pressure (BP) was elevated above 135/85  this visit, please have this repeated by your doctor within one month. --------------

## 2021-02-09 ENCOUNTER — Telehealth: Payer: Self-pay

## 2021-02-09 ENCOUNTER — Encounter: Payer: Self-pay | Admitting: Hematology & Oncology

## 2021-02-09 NOTE — Telephone Encounter (Signed)
Pt called in to r/s her appts from 6/14 to 6/38 due to a conflict wiith an eye dr appt   Webb Silversmith

## 2021-02-12 ENCOUNTER — Emergency Department (HOSPITAL_COMMUNITY): Payer: Medicaid Other

## 2021-02-12 ENCOUNTER — Emergency Department (HOSPITAL_COMMUNITY)
Admission: EM | Admit: 2021-02-12 | Discharge: 2021-02-12 | Disposition: A | Discharge status: Home / Self Care | Payer: Medicaid Other | Attending: Emergency Medicine | Admitting: Emergency Medicine

## 2021-02-12 ENCOUNTER — Encounter (HOSPITAL_COMMUNITY): Payer: Self-pay

## 2021-02-12 ENCOUNTER — Other Ambulatory Visit: Payer: Self-pay

## 2021-02-12 DIAGNOSIS — Z79899 Other long term (current) drug therapy: Secondary | ICD-10-CM | POA: Diagnosis not present

## 2021-02-12 DIAGNOSIS — Z7901 Long term (current) use of anticoagulants: Secondary | ICD-10-CM | POA: Diagnosis not present

## 2021-02-12 DIAGNOSIS — J189 Pneumonia, unspecified organism: Secondary | ICD-10-CM | POA: Insufficient documentation

## 2021-02-12 DIAGNOSIS — I10 Essential (primary) hypertension: Secondary | ICD-10-CM | POA: Insufficient documentation

## 2021-02-12 DIAGNOSIS — R0602 Shortness of breath: Secondary | ICD-10-CM | POA: Diagnosis present

## 2021-02-12 DIAGNOSIS — J3489 Other specified disorders of nose and nasal sinuses: Secondary | ICD-10-CM | POA: Diagnosis not present

## 2021-02-12 DIAGNOSIS — R059 Cough, unspecified: Secondary | ICD-10-CM

## 2021-02-12 LAB — CBC WITH DIFFERENTIAL/PLATELET
Abs Immature Granulocytes: 0.02 10*3/uL (ref 0.00–0.07)
Basophils Absolute: 0 10*3/uL (ref 0.0–0.1)
Basophils Relative: 0 %
Eosinophils Absolute: 0.1 10*3/uL (ref 0.0–0.5)
Eosinophils Relative: 2 %
HCT: 32.5 % — ABNORMAL LOW (ref 36.0–46.0)
Hemoglobin: 10.5 g/dL — ABNORMAL LOW (ref 12.0–15.0)
Immature Granulocytes: 0 %
Lymphocytes Relative: 33 %
Lymphs Abs: 2.3 10*3/uL (ref 0.7–4.0)
MCH: 27.8 pg (ref 26.0–34.0)
MCHC: 32.3 g/dL (ref 30.0–36.0)
MCV: 86 fL (ref 80.0–100.0)
Monocytes Absolute: 0.2 10*3/uL (ref 0.1–1.0)
Monocytes Relative: 3 %
Neutro Abs: 4.3 10*3/uL (ref 1.7–7.7)
Neutrophils Relative %: 62 %
Platelets: 508 10*3/uL — ABNORMAL HIGH (ref 150–400)
RBC: 3.78 MIL/uL — ABNORMAL LOW (ref 3.87–5.11)
RDW: 14.6 % (ref 11.5–15.5)
WBC: 7 10*3/uL (ref 4.0–10.5)
nRBC: 0 % (ref 0.0–0.2)

## 2021-02-12 LAB — BASIC METABOLIC PANEL
Anion gap: 3 — ABNORMAL LOW (ref 5–15)
BUN: 20 mg/dL (ref 6–20)
CO2: 19 mmol/L — ABNORMAL LOW (ref 22–32)
Calcium: 8.6 mg/dL — ABNORMAL LOW (ref 8.9–10.3)
Chloride: 114 mmol/L — ABNORMAL HIGH (ref 98–111)
Creatinine, Ser: 0.92 mg/dL (ref 0.44–1.00)
GFR, Estimated: >60 mL/min (ref >60)
Glucose, Bld: 84 mg/dL (ref 70–99)
Potassium: 4.4 mmol/L (ref 3.5–5.1)
Sodium: 136 mmol/L (ref 135–145)

## 2021-02-12 LAB — D-DIMER, QUANTITATIVE: D-Dimer, Quant: 9.37 ug/mL-FEU — ABNORMAL HIGH (ref 0.00–0.50)

## 2021-02-12 LAB — BRAIN NATRIURETIC PEPTIDE: B Natriuretic Peptide: 32.9 pg/mL (ref 0.0–100.0)

## 2021-02-12 MED ORDER — ALBUTEROL SULFATE HFA 108 (90 BASE) MCG/ACT IN AERS: 2.0000 | INHALATION_SPRAY | RESPIRATORY_TRACT | Status: DC | PRN

## 2021-02-12 MED ORDER — SODIUM CHLORIDE (PF) 0.9 % IJ SOLN
INTRAMUSCULAR | Status: AC
  Filled 2021-02-12: qty 50

## 2021-02-12 MED ORDER — IOHEXOL 350 MG/ML SOLN
100.0000 mL | Freq: Once | INTRAVENOUS | Status: AC | PRN
  Administered 2021-02-12: 100 mL via INTRAVENOUS

## 2021-02-12 NOTE — ED Provider Notes (Signed)
Emergency Medicine Provider Triage Evaluation Note  [name] [name] , a 25 y.o. female  was evaluated in triage.  Pt complains of sob.  Review of Systems  Positive: Cough, wheeze, sob Negative: Fever, hemoptysis  Physical Exam  BP (!) 132/99 (BP Location: Right Arm)   Pulse 99   Temp 98 F (36.7 C) (Oral)   Resp 17   LMP 02/03/2021   SpO2 100%  Gen:   Awake, no distress   Resp:  Normal effort  MSK:   Moves extremities without difficulty  Other:  Faint scattered wheezes  Medical Decision Making  Medically screening exam initiated at 12:10 PM.  Appropriate orders placed.  [name] [name] was informed that the remainder of the evaluation will be completed by another provider, this initial triage assessment does not replace that evaluation, and the importance of remaining in the ED until their evaluation is complete.  Cough and wheeze x 6 days.  Was seen on 5/30 and diagnosed with PNA, on zpak and amox without relief.  Now worsening SOB.  Does have hx of blood clot, on xarelto.     [name] Moras, PA-C 02/12/21 1212    [name] Coons, MD 02/12/21 [phone]

## 2021-02-12 NOTE — ED Provider Notes (Signed)
[facility] DEPT Provider Note   CSN: [id number] Arrival date & time: 02/12/21  1146     History Chief Complaint  Patient presents with  . Cough  . Nasal Congestion  . Shortness of Breath    [name] [name] is a 25 y.o. female.   Cough Cough characteristics:  Non-productive Severity:  Moderate Onset quality:  Gradual Duration:  7 days Progression:  Worsening Chronicity:  New Context comment:  Recent treatment for PNA Relieved by:  Nothing Worsened by:  Nothing Ineffective treatments:  None tried Associated symptoms: rhinorrhea and shortness of breath   Associated symptoms: no chest pain, no chills, no fever, no headaches and no rash   Shortness of Breath Associated symptoms: cough   Associated symptoms: no chest pain, no fever, no headaches, no rash and no vomiting        Past Medical History:  Diagnosis Date  . Accidental methotrexate overdose 01/08/2018  . Acute lower UTI   . Bronchitis 03/19/2019  . Cardiomegaly 05/08/2019  . Chronic midline low back pain without sciatica 11/09/2016   Facet joint arthropathy  . Dehydration 03/19/2019  . Depo-Provera contraceptive status 08/11/2015  . Depression 02/08/2017  . Dysphagia 06/01/2018  . Dysuria 04/19/2019  . Elevated AST (SGOT) 03/19/2019  . Essential hypertension 03/19/2019  . Facial edema   . Gout   . Hypertension   . IDA (iron deficiency anemia) 08/11/2015  . Immunosuppressed status ([place])   . Insomnia 01/29/2016  . Leukopenia 01/10/2018  . Lobar pneumonia ([place]) 06/07/2018  . Lupus ([place])   . Lupus (systemic lupus erythematosus) ([place]) 03/19/2019  . Metabolic acidosis 0/02/2375  . Methotrexate toxicity   . Mouth ulcers   . Mucositis oral 01/12/2018  . Odynophagia 06/01/2018  . Oral thrush 03/19/2019  . Pleuritic chest pain 06/05/2018  . Primary osteoarthritis of both knees 11/09/2016  . Rash due to systemic lupus erythematosus (SLE) ([place])   . Raynaud disease   . Raynaud's disease 08/11/2015  .  Relative acute adrenal insufficiency ([place]) 06/11/2018  . Right leg DVT ([place]) 05/08/2019  . Seasonal allergies 08/11/2015  . Sepsis ([place]) 05/08/2019  . Sinus tachycardia 03/19/2019  . SLE (systemic lupus erythematosus) ([place]) 09/19/2016     Positive ANA, double-stranded DNA, Ro, RNP, RF, history of nasal ulcers, fatigue, anemia, arthritis, Raynauds  . Thrush 06/01/2018  . Vaginal candidiasis 01/12/2018  . Vitamin D deficiency 11/09/2016    Patient Active Problem List   Diagnosis Date Noted  . 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 05/27/2020  . Anxiety 05/27/2020  . PVD (peripheral vascular disease) ([place]) 02/18/2020  . Exacerbation of systemic lupus erythematosus ([place]) 12/06/2019  . Pulmonary embolus ([place]) 05/28/2019  . Sepsis ([place]) 05/08/2019  . Right leg DVT ([place]) 05/08/2019  . Cardiomegaly 05/08/2019  . Dysuria 04/19/2019  . Facial edema   . Rash due to systemic lupus erythematosus (SLE) ([place])   . Immunosuppressed status ([place])   . Acute lower UTI   . 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 Lupus (systemic lupus erythematosus) ([place]) 03/19/2019  . Bronchitis 03/19/2019  . Essential hypertension 03/19/2019  . Elevated AST (SGOT) 03/19/2019  . Dehydration 03/19/2019  . Sinus tachycardia 03/19/2019  . Metabolic acidosis 28/31/5176  . Oral thrush 03/19/2019  . Relative acute adrenal insufficiency ([place]) 06/11/2018  . Anemia 06/09/2018  . Lobar pneumonia ([place]) 06/07/2018  . Pleuritic chest pain 06/05/2018  . Dysphagia 06/01/2018  .  Odynophagia 06/01/2018  . Thrush 06/01/2018  . Mucositis oral 01/12/2018  . Vaginal candidiasis 01/12/2018  . Methotrexate toxicity   . Mouth ulcers   . Leukopenia 01/10/2018  . Accidental methotrexate overdose 01/08/2018  . Depression 02/08/2017  . Primary osteoarthritis of both knees 11/09/2016  . Chronic midline low back pain without sciatica 11/09/2016   . Vitamin D deficiency 11/09/2016  . SLE (systemic lupus erythematosus) ([place]) 09/19/2016  . Insomnia 01/29/2016  . Depo-Provera contraceptive status 08/11/2015  . Raynaud's disease 08/11/2015  . Seasonal allergies 08/11/2015  . IDA (iron deficiency anemia) 08/11/2015    Past Surgical History:  Procedure Laterality Date  . ORIF SHOULDER FRACTURE Left   . TONSILLECTOMY       OB History   No obstetric history on file.     Family History  Problem Relation Age of Onset  . Aneurysm Mother   . Stroke Sister   . Seizures Sister   . Lung cancer Maternal Aunt   . Heart attack Paternal Uncle   . Colon cancer Neg Hx   . Esophageal cancer Neg Hx   . Pancreatic cancer Neg Hx   . Stomach cancer Neg Hx     Social History   Tobacco Use  . Smoking status: Never Smoker  . Smokeless tobacco: Never Used  Vaping Use  . Vaping Use: Never used  Substance Use Topics  . Alcohol use: No  . Drug use: No    Home Medications Prior to Admission medications   Medication Sig Start Date End Date Taking? Authorizing Provider  amLODipine (NORVASC) 10 MG tablet Take 1 tablet (10 mg total) by mouth daily. 03/26/19  Yes Kayleen Memos, DO  amoxicillin (AMOXIL) 500 MG capsule Take 2 capsules (1,000 mg total) by mouth 3 (three) times daily for 5 days. Patient taking differently: Take 1,000 mg by mouth 3 (three) times daily. Start date : 02/08/21 02/08/21 02/13/21 Yes Carlisle Cater, PA-C  azithromycin (ZITHROMAX) 250 MG tablet Take 1 tablet (250 mg total) by mouth daily. Patient taking differently: Take 250 mg by mouth daily. Start date ; 02/08/21 02/08/21  Yes Carlisle Cater, PA-C  benzonatate (TESSALON) 100 MG capsule Take 1 capsule (100 mg total) by mouth every 8 (eight) hours. 02/08/21  Yes Carlisle Cater, PA-C  DULoxetine (CYMBALTA) 30 MG capsule Take 1 capsule (30 mg total) by mouth 2 (two) times daily. 12/16/20  Yes [place] Crane, MD  etonogestrel (NEXPLANON) 68 MG IMPL implant 68 mg by Subdermal  route continuous. 06/25/20 06/13/23 Yes [provider]  ferrous sulfate 325 (65 FE) MG tablet Take 1 tablet (325 mg total) by mouth 2 (two) times daily. With 1/2 glass of orange juice 12/15/15  Yes Davonna Belling, MD  folic acid (FOLVITE) 1 MG tablet Take 1 tablet (1 mg total) by mouth at bedtime. Patient taking differently: Take 1 mg by mouth daily. 03/25/19  Yes Irene Pap N, DO  HYDROmorphone (DILAUDID) 2 MG tablet Take 2 mg by mouth every 4 (four) hours as needed for severe pain.   Yes [provider]  hydroxychloroquine (PLAQUENIL) 200 MG tablet Take 100-200 mg by mouth See admin instructions. Takes 1 tablet in the morning and 1/2 tablet at night.   Yes [provider]  lisinopril (ZESTRIL) 20 MG tablet Take 20 mg by mouth daily.  06/18/19  Yes [provider]  mirtazapine (REMERON) 15 MG tablet Take 1 tablet (15 mg total) by mouth at bedtime. 12/16/20  Yes [place] Crane, MD  Multiple  Vitamins-Minerals (MULTIVITAMIN ADULT PO) Take 1 tablet by mouth daily.   Yes [provider]  mycophenolate (CELLCEPT) 500 MG tablet Take 1,500 mg by mouth 2 (two) times daily. 01/22/21  Yes [provider]  omeprazole (PRILOSEC) 20 MG capsule Take 1 capsule (20 mg total) by mouth daily. 12/24/19  Yes Dorena Dew, FNP  rivaroxaban (XARELTO) 20 MG TABS tablet Take 1 tablet (20 mg total) by mouth daily with supper. Patient taking differently: Take 20 mg by mouth daily. 11/12/20  Yes Volanda Napoleon, MD  Vitamin D, Ergocalciferol, (DRISDOL) 50000 units CAPS capsule Take 1 capsule (50,000 Units total) by mouth 2 (two) times a week. Patient taking differently: Take 50,000 Units by mouth 2 (two) times a week. Monday & Wednesday 12/21/17  Yes Bo Merino, MD    Allergies    Latex  Review of Systems   Review of Systems  Constitutional: Negative for chills and fever.  HENT: Positive for congestion and rhinorrhea.   Respiratory: Positive for cough and  shortness of breath.   Cardiovascular: Negative for chest pain and palpitations.  Gastrointestinal: Negative for diarrhea, nausea and vomiting.  Genitourinary: Negative for difficulty urinating and dysuria.  Musculoskeletal: Negative for arthralgias and back pain.  Skin: Negative for rash and wound.  Neurological: Negative for light-headedness and headaches.    Physical Exam Updated Vital Signs BP (!) 143/116   Pulse 87   Temp 98 F (36.7 C) (Oral)   Resp 12   LMP 02/03/2021 (Approximate)   SpO2 100%   Physical Exam Vitals and nursing note reviewed. Exam conducted with a chaperone present.  Constitutional:      General: She is not in acute distress.    Appearance: Normal appearance.  HENT:     Head: Normocephalic and atraumatic.     Nose: No rhinorrhea.  Eyes:     General:        Right eye: No discharge.        Left eye: No discharge.     Conjunctiva/sclera: Conjunctivae normal.  Cardiovascular:     Rate and Rhythm: Normal rate and regular rhythm.  Pulmonary:     Effort: Pulmonary effort is normal. No respiratory distress.     Breath sounds: No stridor. No decreased breath sounds or wheezing.  Abdominal:     General: Abdomen is flat. There is no distension.     Palpations: Abdomen is soft.  Musculoskeletal:        General: No tenderness or signs of injury.  Skin:    General: Skin is warm and dry.     Capillary Refill: Capillary refill takes less than 2 seconds.  Neurological:     General: No focal deficit present.     Mental Status: She is alert. Mental status is at baseline.     Motor: No weakness.  Psychiatric:        Mood and Affect: Mood normal.        Behavior: Behavior normal.     ED Results / Procedures / Treatments   Labs (all labs ordered are listed, but only abnormal results are displayed) Labs Reviewed  BASIC METABOLIC PANEL - Abnormal; Notable for the following components:      Result Value   Chloride 114 (*)    CO2 19 (*)    Calcium 8.6 (*)     Anion gap 3 (*)    All other components within normal limits  CBC WITH DIFFERENTIAL/PLATELET - Abnormal; Notable for the following components:  RBC 3.78 (*)    Hemoglobin 10.5 (*)    HCT 32.5 (*)    Platelets 508 (*)    All other components within normal limits  D-DIMER, QUANTITATIVE - Abnormal; Notable for the following components:   D-Dimer, Quant 9.37 (*)    All other components within normal limits  BRAIN NATRIURETIC PEPTIDE    EKG None  Radiology DG Chest 2 View  Result Date: 02/12/2021 CLINICAL DATA:  Short of breath.  Recent pneumonia EXAM: CHEST - 2 VIEW COMPARISON:  02/08/2021 FINDINGS: Improving mild left lower lobe airspace disease. Mild blunting right costophrenic angle unchanged from the prior study. No new infiltrate. Pulmonary vascularity normal. IMPRESSION: Improving left lower lobe infiltrate. Mild blunting right costophrenic angle unchanged. Electronically Signed   By: Franchot Gallo M.D.   On: 02/12/2021 12:59   CT Angio Chest PE W and/or Wo Contrast  Result Date: 02/12/2021 CLINICAL DATA:  Shortness of breath EXAM: CT ANGIOGRAPHY CHEST WITH CONTRAST TECHNIQUE: Multidetector CT imaging of the chest was performed using the standard protocol during bolus administration of intravenous contrast. Multiplanar CT image reconstructions and MIPs were obtained to evaluate the vascular anatomy. CONTRAST:  142mL OMNIPAQUE IOHEXOL 350 MG/ML SOLN COMPARISON:  Chest radiograph February 12, 2021; chest CT November 13, 2020 FINDINGS: Cardiovascular: There is no demonstrable pulmonary embolus. There is no thoracic aortic aneurysm or dissection. Visualized great vessels appear normal. Note that the left vertebral artery arises directly from aortic arch, an anatomic variant. There is no appreciable pericardial effusion or pericardial thickening. Mediastinum/Nodes: Thyroid appears normal. There are scattered subcentimeter mediastinal lymph nodes. There is a lymph node in the subcarinal region  measuring 1.3 x 1.2 cm. There is a left hilar lymph node measuring 1.2 x 1.0 cm. No esophageal lesions are appreciable. Lungs/Pleura: There is persistent patchy airspace opacity in the left lower lobe, similar to prior study. There is been significant interval clearing of patchy infiltrate from the lateral and anterior aspects of the right lower lobe. There has also been clearing of patchy infiltrate from the lingula. No new opacities are evident. There is a stable 4 mm nodular opacity in the posterior segment of the left upper lobe seen on axial slice 56 series 10. No pleural effusions are evident. No pneumothorax. Trachea and major bronchial structures appear patent. Upper Abdomen: Visualized upper abdominal structures appear unremarkable. Musculoskeletal: No blastic or lytic bone lesions. No evident chest wall lesions. Review of the MIP images confirms the above findings. IMPRESSION: 1. No appreciable pulmonary embolus. No thoracic aortic aneurysm or dissection. 2. Stable patchy airspace opacity left lower lobe. Partial but incomplete clearing of infiltrate anterolateral aspects of the right lower lobe. Interval clearing of patchy opacity from inferior lingula. No new opacities evident. No pleural effusions. 3. Stable 4 mm nodular opacity a segment left upper lobe. No follow-up needed if patient is low-risk. Non-contrast chest CT can be considered in 12 months if patient is high-risk. This recommendation follows the consensus statement: Guidelines for Management of Incidental Pulmonary Nodules Detected on CT Images: From the Fleischner Society 2017; Radiology 2017; 284:228-243. 4. Prominent left hilar and subcarinal nodes which may have reactive etiology given the parenchymal lung changes. Electronically Signed   By: Lowella Grip III M.D.   On: 02/12/2021 17:05    Procedures Procedures   Medications Ordered in ED Medications  albuterol (VENTOLIN HFA) 108 (90 Base) MCG/ACT inhaler 2 puff (has no  administration in time range)  sodium chloride (PF) 0.9 % injection (0 mLs  Hold 02/12/21 1552)  iohexol (OMNIPAQUE) 350 MG/ML injection 100 mL (100 mLs Intravenous Contrast Given 02/12/21 1638)    ED Course  I have reviewed the triage vital signs and the nursing notes.  Pertinent labs & imaging results that were available during my care of the patient were reviewed by me and considered in my medical decision making (see chart for details).    MDM Rules/Calculators/A&P                          Being treated for outpatient pneumonia.  History of lupus.  History of PE, currently on Xarelto.  Elevated D-dimer at triage.  Labs otherwise unremarkable.  X-ray shows improving chest X ray findings when compared to prior after my radiology review.  Will get CTA PE study.  CTA is unremarkable.  Shows interval improvement or stability of previous infiltrates.  She will continue the regimen medication as its only been a few days.  She is given additional supportive care measures and told to follow-up with her primary care provider and return to [place] with any concerning changes. Final Clinical Impression(s) / ED Diagnoses Final diagnoses:  Cough  Community acquired pneumonia, unspecified laterality    Rx / DC Orders ED Discharge Orders    None       Breck Coons, MD 02/12/21 [phone]

## 2021-02-12 NOTE — Discharge Instructions (Addendum)
In addition to the measures already taking for your community-acquired pneumonia.  You can try honey for cough suppressant.  Continue to hydrate well.  Return to [place] with concerning changes.  Today your laboratory studies are improved.  All of your imaging studies are improved.  There are no blood clots.  Your vital signs are stable and your oxygen is great.  You are safe for discharge home.

## 2021-02-12 NOTE — ED Triage Notes (Signed)
Patient was here on the 30th and was told she has pneumonia.   Patient was given prescriptions of antibiotics (amoxicillin and azithromycin). Patient finished the azithromycin but did not finish amoxicillin and told to follow up with PCP but didn't have an appointment till august.   C/O productive cough (yellow), nasal drainage, and congestion is causing her shob.    A/ox4 Ambulatory in triage.

## 2021-02-23 ENCOUNTER — Other Ambulatory Visit: Payer: Medicaid Other

## 2021-02-23 ENCOUNTER — Ambulatory Visit: Payer: Medicaid Other | Admitting: Family

## 2021-03-09 ENCOUNTER — Other Ambulatory Visit: Payer: Self-pay

## 2021-03-09 ENCOUNTER — Encounter: Payer: Self-pay | Admitting: Family

## 2021-03-09 ENCOUNTER — Inpatient Hospital Stay: Payer: Medicaid Other | Attending: Family

## 2021-03-09 ENCOUNTER — Inpatient Hospital Stay (HOSPITAL_BASED_OUTPATIENT_CLINIC_OR_DEPARTMENT_OTHER): Payer: Medicaid Other | Admitting: Family

## 2021-03-09 ENCOUNTER — Telehealth: Payer: Self-pay

## 2021-03-09 VITALS — BP 120/86 | HR 88 | Temp 98.4°F | Resp 18 | Ht 62.0 in | Wt 142.1 lb

## 2021-03-09 DIAGNOSIS — Z86711 Personal history of pulmonary embolism: Secondary | ICD-10-CM | POA: Diagnosis not present

## 2021-03-09 DIAGNOSIS — M329 Systemic lupus erythematosus, unspecified: Secondary | ICD-10-CM | POA: Diagnosis not present

## 2021-03-09 DIAGNOSIS — E611 Iron deficiency: Secondary | ICD-10-CM | POA: Insufficient documentation

## 2021-03-09 DIAGNOSIS — Z86718 Personal history of other venous thrombosis and embolism: Secondary | ICD-10-CM | POA: Diagnosis not present

## 2021-03-09 DIAGNOSIS — D500 Iron deficiency anemia secondary to blood loss (chronic): Secondary | ICD-10-CM | POA: Diagnosis not present

## 2021-03-09 DIAGNOSIS — I7300 Raynaud's syndrome without gangrene: Secondary | ICD-10-CM | POA: Insufficient documentation

## 2021-03-09 LAB — CMP (CANCER CENTER ONLY)
ALT: 16 U/L (ref 0–44)
AST: 22 U/L (ref 15–41)
Albumin: 3 g/dL — ABNORMAL LOW (ref 3.5–5.0)
Alkaline Phosphatase: 107 U/L (ref 38–126)
Anion gap: 6 (ref 5–15)
BUN: 15 mg/dL (ref 6–20)
CO2: 27 mmol/L (ref 22–32)
Calcium: 9.4 mg/dL (ref 8.9–10.3)
Chloride: 103 mmol/L (ref 98–111)
Creatinine: 0.84 mg/dL (ref 0.44–1.00)
GFR, Estimated: >60 mL/min (ref >60)
Glucose, Bld: 85 mg/dL (ref 70–99)
Potassium: 4 mmol/L (ref 3.5–5.1)
Sodium: 136 mmol/L (ref 135–145)
Total Bilirubin: 0.3 mg/dL (ref 0.3–1.2)
Total Protein: 8.5 g/dL — ABNORMAL HIGH (ref 6.5–8.1)

## 2021-03-09 LAB — CBC WITH DIFFERENTIAL (CANCER CENTER ONLY)
Abs Immature Granulocytes: 0.02 10*3/uL (ref 0.00–0.07)
Basophils Absolute: 0 10*3/uL (ref 0.0–0.1)
Basophils Relative: 0 %
Eosinophils Absolute: 0.1 10*3/uL (ref 0.0–0.5)
Eosinophils Relative: 2 %
HCT: 34.4 % — ABNORMAL LOW (ref 36.0–46.0)
Hemoglobin: 11.2 g/dL — ABNORMAL LOW (ref 12.0–15.0)
Immature Granulocytes: 0 %
Lymphocytes Relative: 31 %
Lymphs Abs: 1.8 10*3/uL (ref 0.7–4.0)
MCH: 28.1 pg (ref 26.0–34.0)
MCHC: 32.6 g/dL (ref 30.0–36.0)
MCV: 86.4 fL (ref 80.0–100.0)
Monocytes Absolute: 0.2 10*3/uL (ref 0.1–1.0)
Monocytes Relative: 4 %
Neutro Abs: 3.6 10*3/uL (ref 1.7–7.7)
Neutrophils Relative %: 63 %
Platelet Count: 326 10*3/uL (ref 150–400)
RBC: 3.98 MIL/uL (ref 3.87–5.11)
RDW: 14.3 % (ref 11.5–15.5)
WBC Count: 5.8 10*3/uL (ref 4.0–10.5)
nRBC: 0 % (ref 0.0–0.2)

## 2021-03-09 LAB — RETICULOCYTES
Immature Retic Fract: 5.3 % (ref 2.3–15.9)
RBC.: 3.97 MIL/uL (ref 3.87–5.11)
Retic Count, Absolute: 67.9 10*3/uL (ref 19.0–186.0)
Retic Ct Pct: 1.7 % (ref 0.4–3.1)

## 2021-03-09 LAB — IRON AND TIBC
Iron: 58 ug/dL (ref 41–142)
Saturation Ratios: 28 % (ref 21–57)
TIBC: 205 ug/dL — ABNORMAL LOW (ref 236–444)
UIBC: 147 ug/dL (ref 120–384)

## 2021-03-09 LAB — FERRITIN: Ferritin: 280 ng/mL (ref 11–307)

## 2021-03-09 NOTE — Telephone Encounter (Signed)
Appts made per 03/09/21 los and pt stated that she would view appts on mychart   [name] [name]

## 2021-03-09 NOTE — Progress Notes (Signed)
Hematology and Oncology Follow Up Visit  [name] [name] [id number] Jul 02, 1996 25 y.o. 03/09/2021   Principle Diagnosis:  DVT of the right lower extremity by [place] and VQ scan indeterminate of PE in August/September 2020 Lupus anticoagulant positive Iron deficiency   Current Therapy:        Oral iron supplement daily   Patient discontinues Xarelto and then aspirin   Interim History:  [name] [name] is here today for follow-up. She is doing fairly well but notes fatigue and joint pain in her legs and feet.  She had pneumonia last month and has finished her antibiotics. She still has some sinus congestion but cough has resolved.  No fever, chills, n/v, rash, dizziness, SOB, chest pain, palpitations, abdominal pain or changes in bowel or bladder habits.  She states that her cycle lasts about 4 weeks since getting in Nexplanon but comes only every several months. Her flow is heavy at times. No other blood loss noted. No bruising or petechiae.  She has some numbness and tingling at times associated with cold temperature and Raynaud's.  No falls or syncope to report.  She has maintained a good appetite and is staying well hydrated. Her weight is stable at 142 lbs.   ECOG Performance Status: 1 - Symptomatic but completely ambulatory  Medications:  Allergies as of 03/09/2021       Reactions   Latex Rash        Medication List        Accurate as of March 09, 2021  9:01 AM. If you have any questions, ask your nurse or doctor.          STOP taking these medications    amLODipine 10 MG tablet Commonly known as: NORVASC Stopped by: Laverna Peace, NP   azithromycin 250 MG tablet Commonly known as: ZITHROMAX Stopped by: Laverna Peace, NP   benzonatate 100 MG capsule Commonly known as: TESSALON Stopped by: Laverna Peace, NP   DULoxetine 30 MG capsule Commonly known as: Cymbalta Stopped by: Laverna Peace, NP   ferrous sulfate 325 (65 FE) MG tablet Stopped by: Laverna Peace, NP       TAKE these medications    folic acid 1 MG tablet Commonly known as: FOLVITE Take 1 tablet (1 mg total) by mouth at bedtime. What changed: when to take this   HYDROmorphone 2 MG tablet Commonly known as: DILAUDID Take 2 mg by mouth every 4 (four) hours as needed for severe pain.   hydroxychloroquine 200 MG tablet Commonly known as: PLAQUENIL Take 100-200 mg by mouth See admin instructions. Takes 1 tablet in the morning and 1/2 tablet at night.   lisinopril 20 MG tablet Commonly known as: ZESTRIL Take 20 mg by mouth daily.   mirtazapine 15 MG tablet Commonly known as: Remeron Take 1 tablet (15 mg total) by mouth at bedtime.   MULTIVITAMIN ADULT PO Take 1 tablet by mouth daily.   mycophenolate 500 MG tablet Commonly known as: CELLCEPT Take 1,500 mg by mouth 2 (two) times daily.   Nexplanon 68 MG Impl implant Generic drug: etonogestrel 68 mg by Subdermal route continuous.   omeprazole 20 MG capsule Commonly known as: PRILOSEC Take 1 capsule (20 mg total) by mouth daily.   rivaroxaban 20 MG Tabs tablet Commonly known as: XARELTO Take 1 tablet (20 mg total) by mouth daily with supper. What changed: when to take this   Vitamin D (Ergocalciferol) 1.25 MG (50000 UNIT) Caps capsule Commonly known as: DRISDOL Take 1 capsule (  50,000 Units total) by mouth 2 (two) times a week. What changed: additional instructions        Allergies:  Allergies  Allergen Reactions   Latex Rash    Past Medical History, Surgical history, Social history, and Family History were reviewed and updated.  Review of Systems: All other 10 point review of systems is negative.   Physical Exam:  height is 5\' 2"  (1.575 m) and weight is 142 lb 1.3 oz (64.4 kg). Her oral temperature is 98.4 F (36.9 C). Her blood pressure is 120/86 and her pulse is 88. Her respiration is 18.   Wt Readings from Last 3 Encounters:  03/09/21 142 lb 1.3 oz (64.4 kg)  02/08/21 150 lb (68 kg)   01/05/21 141 lb 4 oz (64.1 kg)    Ocular: Sclerae unicteric, pupils equal, round and reactive to light Ear-nose-throat: Oropharynx clear, dentition fair Lymphatic: No cervical or supraclavicular adenopathy Lungs no rales or rhonchi, good excursion bilaterally Heart regular rate and rhythm, no murmur appreciated Abd soft, nontender, positive bowel sounds MSK no focal spinal tenderness, no joint edema Neuro: non-focal, well-oriented, appropriate affect Breasts: Deferred   Lab Results  Component Value Date   WBC 5.8 03/09/2021   HGB 11.2 (L) 03/09/2021   HCT 34.4 (L) 03/09/2021   MCV 86.4 03/09/2021   PLT 326 03/09/2021   Lab Results  Component Value Date   FERRITIN 472 (H) 01/05/2021   IRON 50 01/05/2021   TIBC 206 (L) 01/05/2021   UIBC 156 01/05/2021   IRONPCTSAT 24 01/05/2021   Lab Results  Component Value Date   RETICCTPCT 1.7 03/09/2021   RBC 3.97 03/09/2021   RBC 3.98 03/09/2021   No results found for: KPAFRELGTCHN, LAMBDASER, KAPLAMBRATIO No results found for: IGGSERUM, IGA, IGMSERUM No results found for: Odetta Pink, SPEI   Chemistry      Component Value Date/Time   NA 136 03/09/2021 0813   NA 140 12/03/2019 1449   K 4.0 03/09/2021 0813   CL 103 03/09/2021 0813   CO2 27 03/09/2021 0813   BUN 15 03/09/2021 0813   BUN 11 12/03/2019 1449   CREATININE 0.84 03/09/2021 0813   CREATININE 0.57 12/11/2017 1018      Component Value Date/Time   CALCIUM 9.4 03/09/2021 0813   ALKPHOS 107 03/09/2021 0813   AST 22 03/09/2021 0813   ALT 16 03/09/2021 0813   BILITOT 0.3 03/09/2021 0813       Impression and Plan: [name] [name] is a very pleasant 25 yo African American female with history of systemic lupus erythematosus and Raynaud's. She was diagnosed with right lower extremity DVT and VQ scan was indeterminate for PE back in August/September 2020. She preferred to stop the Xarelto and then stopped taking aspirin  recently.  So far, she has not experienced any symptoms of a recurrent clot.  Iron studies are pending. We will replace if needed. She is currently taking an oral supplement.  Follow-up in 4 months.  She can contact our office with any questions or concerns.   Laverna Peace, NP 6/28/20229:01 AM

## 2021-03-11 ENCOUNTER — Encounter: Payer: Self-pay | Admitting: Hematology & Oncology

## 2021-03-17 ENCOUNTER — Other Ambulatory Visit: Payer: Self-pay

## 2021-03-17 ENCOUNTER — Ambulatory Visit (HOSPITAL_COMMUNITY): Payer: Medicaid Other | Admitting: Psychiatry

## 2021-03-17 VITALS — BP 130/92 | HR 87 | Ht 63.0 in | Wt 146.0 lb

## 2021-03-17 DIAGNOSIS — F0632 Mood disorder due to known physiological condition with major depressive-like episode: Secondary | ICD-10-CM

## 2021-03-17 DIAGNOSIS — F419 Anxiety disorder, unspecified: Secondary | ICD-10-CM

## 2021-03-17 MED ORDER — DULOXETINE HCL 30 MG PO CPEP: 30.0000 mg | ORAL_CAPSULE | Freq: Two times a day (BID) | ORAL | 2 refills | Status: DC

## 2021-03-17 MED ORDER — MIRTAZAPINE 15 MG PO TABS: 15.0000 mg | ORAL_TABLET | Freq: Every day | ORAL | 2 refills | Status: DC
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
## 2021-05-07 ENCOUNTER — Ambulatory Visit: Payer: Medicaid Other | Admitting: Hematology & Oncology

## 2021-05-07 ENCOUNTER — Other Ambulatory Visit: Payer: Medicaid Other

## 2021-05-13 ENCOUNTER — Ambulatory Visit (HOSPITAL_COMMUNITY): Payer: Medicaid Other | Admitting: Clinical

## 2021-05-18 ENCOUNTER — Other Ambulatory Visit: Payer: Self-pay

## 2021-05-18 ENCOUNTER — Encounter (HOSPITAL_COMMUNITY): Payer: Self-pay

## 2021-05-18 ENCOUNTER — Emergency Department (HOSPITAL_COMMUNITY)
Admission: EM | Admit: 2021-05-18 | Discharge: 2021-05-18 | Disposition: A | Discharge status: Home / Self Care | Payer: Medicare Other | Attending: Emergency Medicine | Admitting: Emergency Medicine

## 2021-05-18 ENCOUNTER — Ambulatory Visit (HOSPITAL_COMMUNITY)
Admission: EM | Admit: 2021-05-18 | Discharge: 2021-05-18 | Disposition: A | Discharge status: Home / Self Care | Payer: Medicaid Other

## 2021-05-18 ENCOUNTER — Ambulatory Visit (INDEPENDENT_AMBULATORY_CARE_PROVIDER_SITE_OTHER): Payer: Medicaid Other | Admitting: Clinical

## 2021-05-18 DIAGNOSIS — L509 Urticaria, unspecified: Secondary | ICD-10-CM | POA: Insufficient documentation

## 2021-05-18 DIAGNOSIS — Z7901 Long term (current) use of anticoagulants: Secondary | ICD-10-CM | POA: Diagnosis not present

## 2021-05-18 DIAGNOSIS — F0632 Mood disorder due to known physiological condition with major depressive-like episode: Secondary | ICD-10-CM | POA: Diagnosis not present

## 2021-05-18 DIAGNOSIS — T781XXA Other adverse food reactions, not elsewhere classified, initial encounter: Secondary | ICD-10-CM | POA: Diagnosis not present

## 2021-05-18 DIAGNOSIS — F411 Generalized anxiety disorder: Secondary | ICD-10-CM

## 2021-05-18 DIAGNOSIS — Z91040 Latex allergy status: Secondary | ICD-10-CM | POA: Diagnosis not present

## 2021-05-18 DIAGNOSIS — T7840XA Allergy, unspecified, initial encounter: Secondary | ICD-10-CM

## 2021-05-18 DIAGNOSIS — I10 Essential (primary) hypertension: Secondary | ICD-10-CM | POA: Diagnosis not present

## 2021-05-18 DIAGNOSIS — R221 Localized swelling, mass and lump, neck: Secondary | ICD-10-CM | POA: Diagnosis not present

## 2021-05-18 DIAGNOSIS — Z79899 Other long term (current) drug therapy: Secondary | ICD-10-CM | POA: Diagnosis not present

## 2021-05-18 MED ORDER — FAMOTIDINE 20 MG PO TABS
20.0000 mg | ORAL_TABLET | Freq: Once | ORAL | Status: AC
Start: 2021-05-18 — End: 2021-05-18
  Administered 2021-05-18: 20 mg via ORAL
  Filled 2021-05-18: qty 1

## 2021-05-18 MED ORDER — METHYLPREDNISOLONE 4 MG PO TBPK: ORAL_TABLET | ORAL | 0 refills | Status: DC

## 2021-05-18 MED ORDER — PREDNISONE 20 MG PO TABS
60.0000 mg | ORAL_TABLET | Freq: Once | ORAL | Status: AC
Start: 2021-05-18 — End: 2021-05-18
  Administered 2021-05-18: 60 mg via ORAL
  Filled 2021-05-18: qty 3

## 2021-05-18 MED ORDER — DIPHENHYDRAMINE HCL 25 MG PO CAPS
25.0000 mg | ORAL_CAPSULE | Freq: Once | ORAL | Status: AC
Start: 2021-05-18 — End: 2021-05-18
  Administered 2021-05-18: 25 mg via ORAL
  Filled 2021-05-18: qty 1

## 2021-05-18 NOTE — ED Triage Notes (Signed)
Pt sent here from [facility] for allergic reaction. Pt reports her symptoms started after eating a chicken sandwich. Pt also reports taking lisinopril x3 years. Denies any previous allergic reaction to chicken sandwich.  No distress noted.  Speech is normal, no raspy voice noted. Oral edema noted with some difficulty swallowing.

## 2021-05-18 NOTE — Discharge Instructions (Addendum)
You are seen in the emergency department today after having an allergic reaction.  After giving you Benadryl, Pepcid, prednisone which is a steroid it appears that your symptoms have improved.  At this time it is safe for you to be discharged home.  However it is imperative that you return to the emergency department if your symptoms begin to worsen again you have difficulty breathing, swelling of your lips or tongue, shortness of breath, swelling of her eyes.  You may also follow-up with your primary care provider to be set up with an allergist to help identify what caused your allergic reaction. Please return to the emergency department at any time for any other reason.

## 2021-05-18 NOTE — ED Notes (Signed)
Pt refused to have treatment  Offered by Tamala Julian, NP. Pt states she prefer to go to the ED and "be evaluated for infection".

## 2021-05-18 NOTE — ED Provider Notes (Signed)
Emergency Medicine Provider Triage Evaluation Note  [name] [name] , a 25 y.o. female  was evaluated in triage.  Pt complains of facial swelling and trouble swallowing.  Patient reports that her symptoms started 1 hour prior after eating a chicken sandwich meal from [employer].  Patient denies any known food allergies.  Patient does take lisinopril and has been for the last 3 years.   Review of Systems  Positive: Facial swelling, trouble swallowing Negative: Shortness of breath, rash  Physical Exam  BP (!) 134/96   Pulse 99   Temp 98.4 F (36.9 C)   Resp 18   SpO2 100%  Gen:   Awake, no distress   Resp:  Normal effort, lungs clear to auscultation bilaterally MSK:   Moves extremities without difficulty  Other:  Patient has swelling to left eyelid, minimal swelling noted to bilateral lips.  Patient able to handle oral secretions without difficulty.  Speaks in full complete sentences without difficulty.  No urticarial rash noted to bilateral upper arms, or neck.  No swelling to uvula or oropharynx.  No swelling to tongue.  Medical Decision Making  Medically screening exam initiated at 6:57 PM.  Appropriate orders placed.  Clarrisa Wrisley Hogge was informed that the remainder of the evaluation will be completed by another provider, this initial triage assessment does not replace that evaluation, and the importance of remaining in the ED until their evaluation is complete.  Patient made level 2.    Loni Beckwith, PA-C 05/18/21 [id number]    Margette Fast, MD 05/19/21 [id number]

## 2021-05-18 NOTE — ED Triage Notes (Signed)
Pt reports swelling lips, throat closing after eating chicken sandwich and strawberry slushie from [employer] 10-15 min ago. States she always eat this food.  Pt denies any allergies to food or medications.

## 2021-05-18 NOTE — ED Provider Notes (Signed)
[facility] EMERGENCY DEPARTMENT Provider Note   CSN: [id number] Arrival date & time: 05/18/21  1824     History Chief Complaint  Patient presents with   Allergic Reaction    [name] [name] is a 25 y.o. female.  With past medical history of lupus, hypertension, IDA who presented to the emergency department after having allergic reaction.  States that she was in the car after stopping at [employer].  States that she ate a chicken sandwich and a strawberry and mango slushy.  States that after she began having burning around her eyes and tightness in her throat with a dry mouth.  Additionally she reports that she takes lisinopril for the past 3 to 5 years without any complications.  She denies any known medication or food allergies.  Has never had issues with the same food she ate today.   On my exam in the room the patient is in no acute distress.  It appears she has some residual periorbital swelling on the left greater than the right.  Her lips and tongue are not swollen.  There is no stridor present   Allergic Reaction Presenting symptoms: rash   Presenting symptoms: no difficulty swallowing       Past Medical History:  Diagnosis Date   Accidental methotrexate overdose 01/08/2018   Acute lower UTI    Bronchitis 03/19/2019   Cardiomegaly 05/08/2019   Chronic midline low back pain without sciatica 11/09/2016   Facet joint arthropathy   Dehydration 03/19/2019   Depo-Provera contraceptive status 08/11/2015   Depression 02/08/2017   Dysphagia 06/01/2018   Dysuria 04/19/2019   Elevated AST (SGOT) 03/19/2019   Essential hypertension 03/19/2019   Facial edema    Gout    Hypertension    IDA (iron deficiency anemia) 08/11/2015   Immunosuppressed status ([place])    Insomnia 01/29/2016   Leukopenia 01/10/2018   Lobar pneumonia ([place]) 06/07/2018   Lupus ([place])    Lupus (systemic lupus erythematosus) ([place]) [id number]   Metabolic acidosis [id number]   Methotrexate toxicity    Mouth ulcers     Mucositis oral 01/12/2018   Odynophagia 06/01/2018   Oral thrush 03/19/2019   Pleuritic chest pain 06/05/2018   Primary osteoarthritis of both knees 11/09/2016   Rash due to systemic lupus erythematosus (SLE) ([place])    Raynaud disease    Raynaud's disease 08/11/2015   Relative acute adrenal insufficiency ([place]) 06/11/2018   Right leg DVT ([place]) 05/08/2019   Seasonal allergies 08/11/2015   Sepsis ([place]) 05/08/2019   Sinus tachycardia 03/19/2019   SLE (systemic lupus erythematosus) ([place]) 09/19/2016     Positive ANA, double-stranded DNA, Ro, RNP, RF, history of nasal ulcers, fatigue, anemia, arthritis, Raynauds   Thrush 06/01/2018   Vaginal candidiasis 01/12/2018   Vitamin D deficiency 11/09/2016    Patient Active Problem List   Diagnosis Date Noted   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 05/27/2020   Anxiety 05/27/2020   PVD (peripheral vascular disease) ([place]) 02/18/2020   Exacerbation of systemic lupus erythematosus ([place]) 12/06/2019   Pulmonary embolus ([place]) 05/28/2019   Sepsis ([place]) 05/08/2019   Right leg DVT ([place]) 05/08/2019   Cardiomegaly 05/08/2019   Dysuria 04/19/2019   Facial edema    Rash due to systemic lupus erythematosus (SLE) ([place])    Immunosuppressed status ([place])    Acute lower UTI    Depressive disorder due to another medical condition with depressive features 03/24/2019   Lupus (systemic lupus erythematosus) ([place]) 03/19/2019  Bronchitis 03/19/2019   Essential hypertension 03/19/2019   Elevated AST (SGOT) 03/19/2019   Dehydration 03/19/2019   Sinus tachycardia 0000000   Metabolic acidosis 0000000   Oral thrush 03/19/2019   Relative acute adrenal insufficiency ([place]) 06/11/2018   Anemia 06/09/2018   Lobar pneumonia ([place]) 06/07/2018   Pleuritic chest pain 06/05/2018   Dysphagia 06/01/2018   Odynophagia 06/01/2018   Thrush 06/01/2018   Mucositis oral 01/12/2018   Vaginal candidiasis 01/12/2018   Methotrexate toxicity     Mouth ulcers    Leukopenia 01/10/2018   Accidental methotrexate overdose 01/08/2018   Depression 02/08/2017   Primary osteoarthritis of both knees 11/09/2016   Chronic midline low back pain without sciatica 11/09/2016   Vitamin D deficiency 11/09/2016   SLE (systemic lupus erythematosus) ([place]) 09/19/2016   Insomnia 01/29/2016   Depo-Provera contraceptive status 08/11/2015   Raynaud's disease 08/11/2015   Seasonal allergies 08/11/2015   IDA (iron deficiency anemia) 08/11/2015    Past Surgical History:  Procedure Laterality Date   ORIF SHOULDER FRACTURE Left    TONSILLECTOMY       OB History   No obstetric history on file.     Family History  Problem Relation Age of Onset   Aneurysm Mother    Stroke Sister    Seizures Sister    Lung cancer Maternal Aunt    Heart attack Paternal Uncle    Colon cancer Neg Hx    Esophageal cancer Neg Hx    Pancreatic cancer Neg Hx    Stomach cancer Neg Hx     Social History   Tobacco Use   Smoking status: Never   Smokeless tobacco: Never  Vaping Use   Vaping Use: Never used  Substance Use Topics   Alcohol use: No   Drug use: No    Home Medications Prior to Admission medications   Medication Sig Start Date End Date Taking? Authorizing Provider  DULoxetine (CYMBALTA) 30 MG capsule Take 1 capsule (30 mg total) by mouth 2 (two) times daily. 03/17/21   Salley Slaughter, NP  etonogestrel (NEXPLANON) 68 MG IMPL implant 68 mg by Subdermal route continuous. 06/25/20 06/13/23  [provider]  folic acid (FOLVITE) 1 MG tablet Take 1 tablet (1 mg total) by mouth at bedtime. Patient taking differently: Take 1 mg by mouth daily. 03/25/19   Kayleen Memos, DO  HYDROmorphone (DILAUDID) 2 MG tablet Take 2 mg by mouth every 4 (four) hours as needed for severe pain.    [provider]  hydroxychloroquine (PLAQUENIL) 200 MG tablet Take 100-200 mg by mouth See admin instructions. Takes 1 tablet in the morning and 1/2 tablet at  night.    [provider]  lisinopril (ZESTRIL) 20 MG tablet Take 20 mg by mouth daily.  06/18/19   [provider]  mirtazapine (REMERON) 15 MG tablet Take 1 tablet (15 mg total) by mouth at bedtime. 03/17/21   Salley Slaughter, NP  Multiple Vitamins-Minerals (MULTIVITAMIN ADULT PO) Take 1 tablet by mouth daily.    [provider]  mycophenolate (CELLCEPT) 500 MG tablet Take 1,500 mg by mouth 2 (two) times daily. 01/22/21   [provider]  omeprazole (PRILOSEC) 20 MG capsule Take 1 capsule (20 mg total) by mouth daily. 12/24/19   Dorena Dew, FNP  rivaroxaban (XARELTO) 20 MG TABS tablet Take 1 tablet (20 mg total) by mouth daily with supper. Patient taking differently: Take 20 mg by mouth daily. 11/12/20   Volanda Napoleon, MD  Vitamin D, Ergocalciferol, (DRISDOL) 50000 units CAPS capsule Take 1 capsule (50,000 Units total) by mouth 2 (two) times a week. Patient taking differently: Take 50,000 Units by mouth 2 (two) times a week. Monday & Wednesday 12/21/17   Bo Merino, MD    Allergies    Latex  Review of Systems   Review of Systems  Constitutional:  Negative for fever.  HENT:  Positive for facial swelling. Negative for trouble swallowing and voice change.   Eyes:  Positive for itching.  Respiratory:  Negative for shortness of breath and stridor.   Gastrointestinal:  Negative for nausea and vomiting.  Skin:  Positive for rash.  Allergic/Immunologic: Positive for immunocompromised state. Negative for environmental allergies and food allergies.  All other systems reviewed and are negative.  Physical Exam Updated Vital Signs BP (!) 134/96   Pulse 99   Temp 98.4 F (36.9 C)   Resp 18   SpO2 100%   Physical Exam Vitals and nursing note reviewed.  Constitutional:      General: She is not in acute distress.    Appearance: Normal appearance.  HENT:     Head: Normocephalic and atraumatic.     Mouth/Throat:     Mouth: Mucous membranes  are dry.     Pharynx: Oropharynx is clear. No posterior oropharyngeal erythema.  Eyes:     Extraocular Movements: Extraocular movements intact.     Pupils: Pupils are equal, round, and reactive to light.     Comments: Bilateral eyelid swelling L>R  Cardiovascular:     Rate and Rhythm: Normal rate and regular rhythm.     Pulses: Normal pulses.     Heart sounds: Normal heart sounds. No murmur heard. Pulmonary:     Effort: Pulmonary effort is normal.     Breath sounds: Normal breath sounds and air entry. No stridor.  Abdominal:     General: Abdomen is flat.     Palpations: Abdomen is soft.  Musculoskeletal:     Cervical back: Normal range of motion and neck supple.  Skin:    General: Skin is warm and dry.     Capillary Refill: Capillary refill takes less than 2 seconds.     Findings: Rash present. Rash is papular and urticarial.     Comments: Bilateral ventral aspects of upper arms   Neurological:     General: No focal deficit present.     Mental Status: She is alert and oriented to person, place, and time. Mental status is at baseline.    ED Results / Procedures / Treatments   Labs (all labs ordered are listed, but only abnormal results are displayed) Labs Reviewed - No data to display  EKG None  Radiology No results found.  Procedures Procedures   Medications Ordered in ED Medications  diphenhydrAMINE (BENADRYL) capsule 25 mg (25 mg Oral Given 05/18/21 1912)  predniSONE (DELTASONE) tablet 60 mg (60 mg Oral Given 05/18/21 1912)  famotidine (PEPCID) tablet 20 mg (20 mg Oral Given 05/18/21 1912)    ED Course  I have reviewed the triage vital signs and the nursing notes.  Pertinent labs & imaging results that were available during my care of the patient were reviewed by me and considered in my medical decision making (see chart for details).    MDM Rules/Calculators/A&P Delonda Declerck is a 25 year old female who presented to the emergency with an allergic reaction.  Her  symptoms appear more consistent with an acute hypersensitivity reaction and not consistent with acute anaphylaxis or  angioedema as she has no pulmonary, cardiovascular or GI symptoms, or hypotension. She has no evidence of airway compromise or shock at this time. Patient improved with pepcid, benadryl and prednisone. I educated her on strict return precautions to the emergency department for any return of swelling or difficulty breathing. She verbalized understanding of education. Additionally, prescribed prednisone Medrol pack and follow-up with PCP for allergy testing.    Safe for discharge at this time.  Final Clinical Impression(s) / ED Diagnoses Final diagnoses:  Allergic reaction, initial encounter   Rx / DC Orders ED Discharge Orders          Ordered    methylPREDNISolone (MEDROL DOSEPAK) 4 MG TBPK tablet       Note to Pharmacy: Use as directed   05/18/21 2115             Mickie Hillier, PA-C 05/18/21 2126    Pattricia Boss, MD 05/20/21 [phone]

## 2021-05-18 NOTE — ED Notes (Signed)
Patient is being discharged from the Urgent Care and sent to the Emergency Department via staff escort . Per fowler, np, patient is in need of higher level of care due to concerns of patient for allergic reaction/infection. Patient is aware and verbalizes understanding of plan of care.  Vitals:   05/18/21 1809  BP: (!) 143/103  Pulse: 89  Resp: 18  Temp: 98.5 F (36.9 C)  SpO2: 99%

## 2021-05-18 NOTE — ED Provider Notes (Signed)
[facility]    CSN: [id number] Arrival date & time: 05/18/21  1804      History   Chief Complaint Chief Complaint  Patient presents with   Allergic Reaction    HPI [name] [name] is a 25 y.o. female.   Patient here for evaluation of throat itching and swelling that started shortly after eating a chicken sandwich and strawberry slushy from [employer].  Reports that she has eaten this meal multiple times before with no problems.  Denies any history of allergies to foods or medications.  Patient does report a history of lupus.  Has not taken any OTC medications or treatments.  Patient is talking in full sentences and managing secretions.  Denies any trauma, injury, or other precipitating event.  Denies any specific alleviating or aggravating factors.  Denies any fevers, chest pain, shortness of breath, N/V/D, numbness, tingling, weakness, abdominal pain, or headaches.    The history is provided by the patient.  Allergic Reaction Presenting symptoms: difficulty swallowing    Past Medical History:  Diagnosis Date   Accidental methotrexate overdose 01/08/2018   Acute lower UTI    Bronchitis 03/19/2019   Cardiomegaly 05/08/2019   Chronic midline low back pain without sciatica 11/09/2016   Facet joint arthropathy   Dehydration 03/19/2019   Depo-Provera contraceptive status 08/11/2015   Depression 02/08/2017   Dysphagia 06/01/2018   Dysuria 04/19/2019   Elevated AST (SGOT) 03/19/2019   Essential hypertension 03/19/2019   Facial edema    Gout    Hypertension    IDA (iron deficiency anemia) 08/11/2015   Immunosuppressed status ([place])    Insomnia 01/29/2016   Leukopenia 01/10/2018   Lobar pneumonia ([place]) 06/07/2018   Lupus ([place])    Lupus (systemic lupus erythematosus) ([place]) [id number]   Metabolic acidosis [id number]   Methotrexate toxicity    Mouth ulcers    Mucositis oral 01/12/2018   Odynophagia 06/01/2018   Oral thrush 03/19/2019   Pleuritic chest pain 06/05/2018   Primary osteoarthritis of  both knees 11/09/2016   Rash due to systemic lupus erythematosus (SLE) ([place])    Raynaud disease    Raynaud's disease 08/11/2015   Relative acute adrenal insufficiency ([place]) 06/11/2018   Right leg DVT ([place]) 05/08/2019   Seasonal allergies 08/11/2015   Sepsis ([place]) 05/08/2019   Sinus tachycardia 03/19/2019   SLE (systemic lupus erythematosus) ([place]) 09/19/2016     Positive ANA, double-stranded DNA, Ro, RNP, RF, history of nasal ulcers, fatigue, anemia, arthritis, Raynauds   Thrush 06/01/2018   Vaginal candidiasis 01/12/2018   Vitamin D deficiency 11/09/2016    Patient Active Problem List   Diagnosis Date Noted   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 05/27/2020   Anxiety 05/27/2020   PVD (peripheral vascular disease) ([place]) 02/18/2020   Exacerbation of systemic lupus erythematosus ([place]) 12/06/2019   Pulmonary embolus ([place]) 05/28/2019   Sepsis ([place]) 05/08/2019   Right leg DVT ([place]) 05/08/2019   Cardiomegaly 05/08/2019   Dysuria 04/19/2019   Facial edema    Rash due to systemic lupus erythematosus (SLE) ([place])    Immunosuppressed status ([place])    Acute lower UTI    Depressive disorder due to another medical condition with depressive features 03/24/2019   Lupus (systemic lupus erythematosus) ([place]) 03/19/2019   Bronchitis 03/19/2019   Essential hypertension 03/19/2019   Elevated AST (SGOT) 03/19/2019   Dehydration 03/19/2019   Sinus tachycardia 0000000   Metabolic acidosis 0000000   Oral thrush 03/19/2019   Relative acute  adrenal insufficiency ([place]) 06/11/2018   Anemia 06/09/2018   Lobar pneumonia ([place]) 06/07/2018   Pleuritic chest pain 06/05/2018   Dysphagia 06/01/2018   Odynophagia 06/01/2018   Thrush 06/01/2018   Mucositis oral 01/12/2018   Vaginal candidiasis 01/12/2018   Methotrexate toxicity    Mouth ulcers    Leukopenia 01/10/2018   Accidental methotrexate overdose 01/08/2018   Depression 02/08/2017   Primary osteoarthritis  of both knees 11/09/2016   Chronic midline low back pain without sciatica 11/09/2016   Vitamin D deficiency 11/09/2016   SLE (systemic lupus erythematosus) ([place]) 09/19/2016   Insomnia 01/29/2016   Depo-Provera contraceptive status 08/11/2015   Raynaud's disease 08/11/2015   Seasonal allergies 08/11/2015   IDA (iron deficiency anemia) 08/11/2015    Past Surgical History:  Procedure Laterality Date   ORIF SHOULDER FRACTURE Left    TONSILLECTOMY      OB History   No obstetric history on file.      Home Medications    Prior to Admission medications   Medication Sig Start Date End Date Taking? Authorizing Provider  DULoxetine (CYMBALTA) 30 MG capsule Take 1 capsule (30 mg total) by mouth 2 (two) times daily. 03/17/21   Salley Slaughter, NP  etonogestrel (NEXPLANON) 68 MG IMPL implant 68 mg by Subdermal route continuous. 06/25/20 06/13/23  [provider]  folic acid (FOLVITE) 1 MG tablet Take 1 tablet (1 mg total) by mouth at bedtime. Patient taking differently: Take 1 mg by mouth daily. 03/25/19   Kayleen Memos, DO  HYDROmorphone (DILAUDID) 2 MG tablet Take 2 mg by mouth every 4 (four) hours as needed for severe pain.    [provider]  hydroxychloroquine (PLAQUENIL) 200 MG tablet Take 100-200 mg by mouth See admin instructions. Takes 1 tablet in the morning and 1/2 tablet at night.    [provider]  lisinopril (ZESTRIL) 20 MG tablet Take 20 mg by mouth daily.  06/18/19   [provider]  mirtazapine (REMERON) 15 MG tablet Take 1 tablet (15 mg total) by mouth at bedtime. 03/17/21   Salley Slaughter, NP  Multiple Vitamins-Minerals (MULTIVITAMIN ADULT PO) Take 1 tablet by mouth daily.    [provider]  mycophenolate (CELLCEPT) 500 MG tablet Take 1,500 mg by mouth 2 (two) times daily. 01/22/21   [provider]  omeprazole (PRILOSEC) 20 MG capsule Take 1 capsule (20 mg total) by mouth daily. 12/24/19   Dorena Dew, FNP   rivaroxaban (XARELTO) 20 MG TABS tablet Take 1 tablet (20 mg total) by mouth daily with supper. Patient taking differently: Take 20 mg by mouth daily. 11/12/20   Volanda Napoleon, MD  Vitamin D, Ergocalciferol, (DRISDOL) 50000 units CAPS capsule Take 1 capsule (50,000 Units total) by mouth 2 (two) times a week. Patient taking differently: Take 50,000 Units by mouth 2 (two) times a week. Monday & Wednesday 12/21/17   Bo Merino, MD    Family History Family History  Problem Relation Age of Onset   Aneurysm Mother    Stroke Sister    Seizures Sister    Lung cancer Maternal Aunt    Heart attack Paternal Uncle    Colon cancer Neg Hx    Esophageal cancer Neg Hx    Pancreatic cancer Neg Hx    Stomach cancer Neg Hx     Social History Social History   Tobacco Use   Smoking status: Never   Smokeless tobacco: Never  Vaping Use   Vaping Use: Never  used  Substance Use Topics   Alcohol use: No   Drug use: No     Allergies   Latex   Review of Systems Review of Systems  HENT:  Positive for facial swelling and trouble swallowing.   All other systems reviewed and are negative.   Physical Exam Triage Vital Signs ED Triage Vitals  Enc Vitals Group     BP 05/18/21 1809 (!) 143/103     Pulse Rate 05/18/21 1809 89     Resp 05/18/21 1809 18     Temp 05/18/21 1809 98.5 F (36.9 C)     Temp Source 05/18/21 1809 Oral     SpO2 05/18/21 1809 99 %     Weight --      Height --      Head Circumference --      Peak Flow --      Pain Score 05/18/21 1811 0     Pain Loc --      Pain Edu? --      Excl. in [place]? --    No data found.  Updated Vital Signs BP (!) 143/103 (BP Location: Left Arm)   Pulse 89   Temp 98.5 F (36.9 C) (Oral)   Resp 18   SpO2 99%   Visual Acuity Right Eye Distance:   Left Eye Distance:   Bilateral Distance:    Right Eye Near:   Left Eye Near:    Bilateral Near:     Physical Exam Vitals and nursing note reviewed.  Constitutional:       General: She is not in acute distress.    Appearance: Normal appearance. She is not ill-appearing, toxic-appearing or diaphoretic.  HENT:     Head: Normocephalic and atraumatic.     Mouth/Throat:     Mouth: No angioedema.     Tongue: No lesions.     Palate: No mass.     Pharynx: Oropharynx is clear. Uvula midline. No pharyngeal swelling, oropharyngeal exudate, posterior oropharyngeal erythema or uvula swelling.     Tonsils: No tonsillar exudate or tonsillar abscesses.  Eyes:     General: No allergic shiner.       Right eye: No foreign body, discharge or hordeolum.        Left eye: No foreign body or hordeolum.     Conjunctiva/sclera: Conjunctivae normal.     Comments: Upper eyelid edema, more on left than right, patient able to open eyes with no problem  Cardiovascular:     Rate and Rhythm: Normal rate.     Pulses: Normal pulses.     Heart sounds: Normal heart sounds.  Pulmonary:     Effort: Pulmonary effort is normal.     Breath sounds: Normal breath sounds.  Abdominal:     General: Abdomen is flat.  Musculoskeletal:        General: Normal range of motion.     Cervical back: Normal range of motion.  Lymphadenopathy:     Head:     Right side of head: Submandibular adenopathy present.     Left side of head: Submandibular adenopathy present.  Skin:    General: Skin is warm and dry.  Neurological:     General: No focal deficit present.     Mental Status: She is alert and oriented to person, place, and time.  Psychiatric:        Mood and Affect: Mood normal.     UC Treatments / Results  Labs (all labs ordered are  listed, but only abnormal results are displayed) Labs Reviewed - No data to display  EKG   Radiology No results found.  Procedures Procedures (including critical care time)  Medications Ordered in UC Medications - No data to display  Initial Impression / Assessment and Plan / UC Course  I have reviewed the triage vital signs and the nursing  notes.  Pertinent labs & imaging results that were available during my care of the patient were reviewed by me and considered in my medical decision making (see chart for details).    Patient does not appear to be in any distress.  Patient was offered treatment for allergic reaction and declined stating that she would rather go to the emergency room for further evaluation.  Vital signs stable and patient is able to transport herself to ED via private vehicle Final Clinical Impressions(s) / UC Diagnoses   Final diagnoses:  Throat swelling   Discharge Instructions   None    ED Prescriptions   None    PDMP not reviewed this encounter.   Pearson Forster, NP 05/18/21 Vernelle Emerald
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
## 2021-06-14 ENCOUNTER — Encounter: Payer: Self-pay | Admitting: Hematology & Oncology

## 2021-06-17 ENCOUNTER — Encounter: Payer: Self-pay | Admitting: Hematology & Oncology

## 2021-06-17 ENCOUNTER — Encounter (HOSPITAL_COMMUNITY): Payer: Self-pay | Admitting: Psychiatry

## 2021-06-17 ENCOUNTER — Other Ambulatory Visit: Payer: Self-pay

## 2021-06-17 ENCOUNTER — Ambulatory Visit (INDEPENDENT_AMBULATORY_CARE_PROVIDER_SITE_OTHER): Payer: Medicare Other | Admitting: Psychiatry

## 2021-06-17 VITALS — BP 135/92 | HR 90 | Ht 63.0 in

## 2021-06-17 DIAGNOSIS — G4701 Insomnia due to medical condition: Secondary | ICD-10-CM | POA: Diagnosis not present

## 2021-06-17 DIAGNOSIS — F419 Anxiety disorder, unspecified: Secondary | ICD-10-CM

## 2021-06-17 DIAGNOSIS — F0632 Mood disorder due to known physiological condition with major depressive-like episode: Secondary | ICD-10-CM | POA: Diagnosis not present

## 2021-06-17 MED ORDER — BELSOMRA 5 MG PO TABS: 5.0000 mg | ORAL_TABLET | Freq: Every evening | ORAL | 2 refills | Status: DC

## 2021-06-17 MED ORDER — DULOXETINE HCL 30 MG PO CPEP: 30.0000 mg | ORAL_CAPSULE | Freq: Two times a day (BID) | ORAL | 3 refills | Status: DC
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
## 2021-06-18 ENCOUNTER — Encounter: Payer: Self-pay | Admitting: Hematology & Oncology

## 2021-06-24 ENCOUNTER — Ambulatory Visit: Payer: Self-pay | Admitting: Nurse Practitioner

## 2021-06-30 ENCOUNTER — Encounter: Payer: Self-pay | Admitting: Hematology & Oncology

## 2021-07-07 ENCOUNTER — Encounter: Payer: Self-pay | Admitting: Family

## 2021-07-07 ENCOUNTER — Inpatient Hospital Stay: Payer: Medicare Other | Attending: Hematology & Oncology

## 2021-07-07 ENCOUNTER — Inpatient Hospital Stay (HOSPITAL_BASED_OUTPATIENT_CLINIC_OR_DEPARTMENT_OTHER): Payer: Medicare Other | Admitting: Family

## 2021-07-07 ENCOUNTER — Other Ambulatory Visit: Payer: Self-pay

## 2021-07-07 ENCOUNTER — Encounter: Payer: Self-pay | Admitting: Hematology & Oncology

## 2021-07-07 VITALS — BP 134/95 | HR 93 | Temp 97.7°F | Resp 20 | Wt 160.5 lb

## 2021-07-07 DIAGNOSIS — R5383 Other fatigue: Secondary | ICD-10-CM | POA: Diagnosis not present

## 2021-07-07 DIAGNOSIS — R760 Raised antibody titer: Secondary | ICD-10-CM | POA: Diagnosis not present

## 2021-07-07 DIAGNOSIS — I2699 Other pulmonary embolism without acute cor pulmonale: Secondary | ICD-10-CM

## 2021-07-07 DIAGNOSIS — D500 Iron deficiency anemia secondary to blood loss (chronic): Secondary | ICD-10-CM

## 2021-07-07 DIAGNOSIS — Z86711 Personal history of pulmonary embolism: Secondary | ICD-10-CM | POA: Diagnosis not present

## 2021-07-07 DIAGNOSIS — Z86718 Personal history of other venous thrombosis and embolism: Secondary | ICD-10-CM | POA: Insufficient documentation

## 2021-07-07 DIAGNOSIS — I7300 Raynaud's syndrome without gangrene: Secondary | ICD-10-CM | POA: Insufficient documentation

## 2021-07-07 DIAGNOSIS — M3214 Glomerular disease in systemic lupus erythematosus: Secondary | ICD-10-CM | POA: Diagnosis not present

## 2021-07-07 DIAGNOSIS — E611 Iron deficiency: Secondary | ICD-10-CM | POA: Insufficient documentation

## 2021-07-07 LAB — CBC WITH DIFFERENTIAL (CANCER CENTER ONLY)
Abs Immature Granulocytes: 0.19 10*3/uL — ABNORMAL HIGH (ref 0.00–0.07)
Basophils Absolute: 0 10*3/uL (ref 0.0–0.1)
Basophils Relative: 0 %
Eosinophils Absolute: 0 10*3/uL (ref 0.0–0.5)
Eosinophils Relative: 0 %
HCT: 35.4 % — ABNORMAL LOW (ref 36.0–46.0)
Hemoglobin: 11.8 g/dL — ABNORMAL LOW (ref 12.0–15.0)
Immature Granulocytes: 2 %
Lymphocytes Relative: 5 %
Lymphs Abs: 0.5 10*3/uL — ABNORMAL LOW (ref 0.7–4.0)
MCH: 28.2 pg (ref 26.0–34.0)
MCHC: 33.3 g/dL (ref 30.0–36.0)
MCV: 84.7 fL (ref 80.0–100.0)
Monocytes Absolute: 0.2 10*3/uL (ref 0.1–1.0)
Monocytes Relative: 2 %
Neutro Abs: 10.4 10*3/uL — ABNORMAL HIGH (ref 1.7–7.7)
Neutrophils Relative %: 91 %
Platelet Count: 409 10*3/uL — ABNORMAL HIGH (ref 150–400)
RBC: 4.18 MIL/uL (ref 3.87–5.11)
RDW: 13.2 % (ref 11.5–15.5)
WBC Count: 11.4 10*3/uL — ABNORMAL HIGH (ref 4.0–10.5)
nRBC: 0 % (ref 0.0–0.2)

## 2021-07-07 LAB — RETICULOCYTES
Immature Retic Fract: 14 % (ref 2.3–15.9)
RBC.: 4.15 MIL/uL (ref 3.87–5.11)
Retic Count, Absolute: 63.1 10*3/uL (ref 19.0–186.0)
Retic Ct Pct: 1.5 % (ref 0.4–3.1)

## 2021-07-07 NOTE — Progress Notes (Signed)
Hematology and Oncology Follow Up Visit  JORDEN MAHL [id number] 02-08-1996 25 y.o. 07/07/2021   Principle Diagnosis:  DVT of the right lower extremity by [place] and VQ scan indeterminate of PE in August/September 2020 Lupus anticoagulant positive Iron deficiency   Current Therapy:        Oral iron supplement daily   Patient discontinued Xarelto and then aspirin   Interim History:  Ms. Runkles is here today for follow-up. She is symptomatic with fatigue at this time.  No blood loss noted. No abnormal bruising, no petechiae.  She has been receiving cylcophosmaide infusions for Lupus nephritis. She is followed closely by nephrology.  She notes constipation since restarting magnesium and taking the oral iron supplement.  She denies any new recurrent thrombotic event.  No fever, chills, n/v, cough, rash, dizziness, SOB, chest pain, palpitations or abdominal pain.  She notes that she doesn't always empty when she urinates. Nephrology aware.  No swelling in her extremities at this time.  She has intermittent tingling in her arms, more often in the left arm.  No falls or syncope to report.  She has maintained a good appetite and is careful to hydrate properly. Her weight is 160 lbs.   ECOG Performance Status: 1 - Symptomatic but completely ambulatory  Medications:  Allergies as of 07/07/2021       Reactions   Latex Rash        Medication List        Accurate as of July 07, 2021  2:04 PM. If you have any questions, ask your nurse or doctor.          Belsomra 5 MG Tabs Generic drug: Suvorexant Take 5 mg by mouth at bedtime.   DULoxetine 30 MG capsule Commonly known as: Cymbalta Take 1 capsule (30 mg total) by mouth 2 (two) times daily.   folic acid 1 MG tablet Commonly known as: FOLVITE Take 1 tablet (1 mg total) by mouth at bedtime. What changed: when to take this   HYDROmorphone 2 MG tablet Commonly known as: DILAUDID Take 2 mg by mouth every 4 (four) hours as  needed for severe pain.   hydroxychloroquine 200 MG tablet Commonly known as: PLAQUENIL Take 100-200 mg by mouth See admin instructions. Takes 1 tablet in the morning and 1/2 tablet at night.   lisinopril 20 MG tablet Commonly known as: ZESTRIL Take 20 mg by mouth daily.   methylPREDNISolone 4 MG Tbpk tablet Commonly known as: MEDROL DOSEPAK Please use as directed on the bottle.   MULTIVITAMIN ADULT PO Take 1 tablet by mouth daily.   mycophenolate 500 MG tablet Commonly known as: CELLCEPT Take 1,500 mg by mouth 2 (two) times daily.   Nexplanon 68 MG Impl implant Generic drug: etonogestrel 68 mg by Subdermal route continuous.   omeprazole 20 MG capsule Commonly known as: PRILOSEC Take 1 capsule (20 mg total) by mouth daily.   rivaroxaban 20 MG Tabs tablet Commonly known as: XARELTO Take 1 tablet (20 mg total) by mouth daily with supper. What changed: when to take this   Vitamin D (Ergocalciferol) 1.25 MG (50000 UNIT) Caps capsule Commonly known as: DRISDOL Take 1 capsule (50,000 Units total) by mouth 2 (two) times a week. What changed: additional instructions        Allergies:  Allergies  Allergen Reactions   Latex Rash    Past Medical History, Surgical history, Social history, and Family History were reviewed and updated.  Review of Systems: All other 10 point  review of systems is negative.   Physical Exam:  vitals were not taken for this visit.   Wt Readings from Last 3 Encounters:  03/09/21 142 lb 1.3 oz (64.4 kg)  02/08/21 150 lb (68 kg)  01/05/21 141 lb 4 oz (64.1 kg)    Ocular: Sclerae unicteric, pupils equal, round and reactive to light Ear-nose-throat: Oropharynx clear, dentition fair Lymphatic: No cervical or supraclavicular adenopathy Lungs no rales or rhonchi, good excursion bilaterally Heart regular rate and rhythm, no murmur appreciated Abd soft, nontender, positive bowel sounds MSK no focal spinal tenderness, no joint edema Neuro:  non-focal, well-oriented, appropriate affect Breasts: Deferred   Lab Results  Component Value Date   WBC 11.4 (H) 07/07/2021   HGB 11.8 (L) 07/07/2021   HCT 35.4 (L) 07/07/2021   MCV 84.7 07/07/2021   PLT 409 (H) 07/07/2021   Lab Results  Component Value Date   FERRITIN 280 03/09/2021   IRON 58 03/09/2021   TIBC 205 (L) 03/09/2021   UIBC 147 03/09/2021   IRONPCTSAT 28 03/09/2021   Lab Results  Component Value Date   RETICCTPCT 1.5 07/07/2021   RBC 4.15 07/07/2021   No results found for: KPAFRELGTCHN, LAMBDASER, KAPLAMBRATIO No results found for: IGGSERUM, IGA, IGMSERUM No results found for: Odetta Pink, SPEI   Chemistry      Component Value Date/Time   NA 136 03/09/2021 0813   NA 140 12/03/2019 1449   K 4.0 03/09/2021 0813   CL 103 03/09/2021 0813   CO2 27 03/09/2021 0813   BUN 15 03/09/2021 0813   BUN 11 12/03/2019 1449   CREATININE 0.84 03/09/2021 0813   CREATININE 0.57 12/11/2017 1018      Component Value Date/Time   CALCIUM 9.4 03/09/2021 0813   ALKPHOS 107 03/09/2021 0813   AST 22 03/09/2021 0813   ALT 16 03/09/2021 0813   BILITOT 0.3 03/09/2021 0813       Impression and Plan: Ms. Adduci is a very pleasant 25 yo African American female with history of systemic lupus erythematosus and Raynaud's. She was diagnosed with right lower extremity DVT and VQ scan was indeterminate for PE back in August/September 2020. So far, she she has not had a new recurrence.  Follow-up in 6 months.  She can contact our office with any questions or concerns.   Lottie Dawson, NP 10/26/20222:04 PM

## 2021-07-08 ENCOUNTER — Encounter: Payer: Self-pay | Admitting: Nurse Practitioner

## 2021-07-08 ENCOUNTER — Telehealth: Payer: Self-pay | Admitting: *Deleted

## 2021-07-08 ENCOUNTER — Encounter: Payer: Self-pay | Admitting: Hematology & Oncology

## 2021-07-08 ENCOUNTER — Ambulatory Visit (INDEPENDENT_AMBULATORY_CARE_PROVIDER_SITE_OTHER): Payer: Medicare Other | Admitting: Nurse Practitioner

## 2021-07-08 VITALS — BP 138/96 | HR 88 | Temp 97.9°F | Ht 62.0 in | Wt 159.0 lb

## 2021-07-08 DIAGNOSIS — N920 Excessive and frequent menstruation with regular cycle: Secondary | ICD-10-CM | POA: Insufficient documentation

## 2021-07-08 DIAGNOSIS — I10 Essential (primary) hypertension: Secondary | ICD-10-CM | POA: Diagnosis not present

## 2021-07-08 DIAGNOSIS — M329 Systemic lupus erythematosus, unspecified: Secondary | ICD-10-CM | POA: Diagnosis not present

## 2021-07-08 DIAGNOSIS — Z23 Encounter for immunization: Secondary | ICD-10-CM

## 2021-07-08 LAB — IRON AND TIBC
Iron: 32 ug/dL — ABNORMAL LOW (ref 41–142)
Saturation Ratios: 14 % — ABNORMAL LOW (ref 21–57)
TIBC: 224 ug/dL — ABNORMAL LOW (ref 236–444)
UIBC: 192 ug/dL (ref 120–384)

## 2021-07-08 LAB — FERRITIN: Ferritin: 154 ng/mL (ref 11–307)

## 2021-07-08 MED ORDER — BLOOD PRESSURE MONITOR KIT: 1.0000 | PACK | Freq: Every day | 0 refills | Status: AC

## 2021-07-08 NOTE — Progress Notes (Signed)
[address] [place], [place]  [address] Phone:  [phone]   Fax:  [phone]   Established Patient Office Visit  Subjective:  Patient ID: [name] [name], female    DOB: 31-Jul-1996  Age: 25 y.o. MRN: [id number]  CC:  Chief Complaint  Patient presents with   Follow-up    6 month followup    HPI [name] [name] presents for follow up. She  has a past medical history of Accidental methotrexate overdose (01/08/2018), Acute lower UTI, Bronchitis (03/19/2019), Cardiomegaly (05/08/2019), Chronic midline low back pain without sciatica (11/09/2016), Dehydration (03/19/2019), Depo-Provera contraceptive status (08/11/2015), Depression (02/08/2017), Dysphagia (06/01/2018), Dysuria (04/19/2019), Elevated AST (SGOT) (03/19/2019), Essential hypertension (03/19/2019), Facial edema, Gout, Hypertension, IDA (iron deficiency anemia) (08/11/2015), Immunosuppressed status ([place]), Insomnia (01/29/2016), Leukopenia (01/10/2018), Lobar pneumonia ([place]) (06/07/2018), Lupus ([place]), Lupus (systemic lupus erythematosus) ([place]) (02/18/6294), Metabolic acidosis (10/20/4130), Methotrexate toxicity, Mouth ulcers, Mucositis oral (01/12/2018), Odynophagia (06/01/2018), Oral thrush (03/19/2019), Pleuritic chest pain (06/05/2018), Primary osteoarthritis of both knees (11/09/2016), Rash due to systemic lupus erythematosus (SLE) ([place]), Raynaud disease, Raynaud's disease (08/11/2015), Relative acute adrenal insufficiency ([place]) (06/11/2018), Right leg DVT ([place]) (05/08/2019), Seasonal allergies (08/11/2015), Sepsis ([place]) (05/08/2019), Sinus tachycardia (03/19/2019), SLE (systemic lupus erythematosus) ([place]) (09/19/2016), Thrush (06/01/2018), Vaginal candidiasis (01/12/2018), and Vitamin D deficiency (11/09/2016).   She reports that she is no longer taking Lisinopril. She was off the medication for sometime and restarted this and had a reaction. She was started on Losartan 25 mg last night. She does not have a in home BP monitor. Denies headache,  dizziness, visual changes, shortness of breath, dyspnea on exertion, chest pain, nausea, vomiting or any edema.  She is followed closely by [facility] She had labs completed on 07/02/21 or 07/06/21. She was seen for her IDA on yesterday.   She reports that she was granted disability.  Past Medical History:  Diagnosis Date   Accidental methotrexate overdose 01/08/2018   Acute lower UTI    Bronchitis 03/19/2019   Cardiomegaly 05/08/2019   Chronic midline low back pain without sciatica 11/09/2016   Facet joint arthropathy   Dehydration 03/19/2019   Depo-Provera contraceptive status 08/11/2015   Depression 02/08/2017   Dysphagia 06/01/2018   Dysuria 04/19/2019   Elevated AST (SGOT) 03/19/2019   Essential hypertension 03/19/2019   Facial edema    Gout    Hypertension    IDA (iron deficiency anemia) 08/11/2015   Immunosuppressed status ([place])    Insomnia 01/29/2016   Leukopenia 01/10/2018   Lobar pneumonia ([place]) 06/07/2018   Lupus ([place])    Lupus (systemic lupus erythematosus) ([place]) 12/14/100   Metabolic acidosis 03/13/5365   Methotrexate toxicity    Mouth ulcers    Mucositis oral 01/12/2018   Odynophagia 06/01/2018   Oral thrush 03/19/2019   Pleuritic chest pain 06/05/2018   Primary osteoarthritis of both knees 11/09/2016   Rash due to systemic lupus erythematosus (SLE) ([place])    Raynaud disease    Raynaud's disease 08/11/2015   Relative acute adrenal insufficiency ([place]) 06/11/2018   Right leg DVT ([place]) 05/08/2019   Seasonal allergies 08/11/2015   Sepsis ([place]) 05/08/2019   Sinus tachycardia 03/19/2019   SLE (systemic lupus erythematosus) ([place]) 09/19/2016     Positive ANA, double-stranded DNA, Ro, RNP, RF, history of nasal ulcers, fatigue, anemia, arthritis, Raynauds   Thrush 06/01/2018   Vaginal candidiasis 01/12/2018   Vitamin D deficiency 11/09/2016    Past Surgical History:  Procedure Laterality Date   ORIF SHOULDER FRACTURE Left  TONSILLECTOMY      Family History  Problem Relation Age of Onset    Aneurysm Mother    Stroke Sister    Seizures Sister    Lung cancer Maternal Aunt    Heart attack Paternal Uncle    Colon cancer Neg Hx    Esophageal cancer Neg Hx    Pancreatic cancer Neg Hx    Stomach cancer Neg Hx     Social History   Socioeconomic History   Marital status: Single    Spouse name: Not on file   Number of children: Not on file   Years of education: Not on file   Highest education level: Not on file  Occupational History   Not on file  Tobacco Use   Smoking status: Never   Smokeless tobacco: Never  Vaping Use   Vaping Use: Never used  Substance and Sexual Activity   Alcohol use: No   Drug use: No   Sexual activity: Not Currently  Other Topics Concern   Not on file  Social History Narrative   Not on file   Social Determinants of Health   Financial Resource Strain: Not on file  Food Insecurity: Not on file  Transportation Needs: Not on file  Physical Activity: Not on file  Stress: Not on file  Social Connections: Not on file  Intimate Partner Violence: Not on file    Outpatient Medications Prior to Visit  Medication Sig Dispense Refill   DULoxetine (CYMBALTA) 30 MG capsule Take 1 capsule (30 mg total) by mouth 2 (two) times daily. 60 capsule 3   etonogestrel (NEXPLANON) 68 MG IMPL implant 68 mg by Subdermal route continuous.     famotidine (PEPCID) 20 MG tablet Take 20 mg by mouth daily.     folic acid (FOLVITE) 1 MG tablet Take 1 tablet (1 mg total) by mouth at bedtime. (Patient taking differently: Take 1 mg by mouth daily.) 30 tablet 0   hydroxychloroquine (PLAQUENIL) 200 MG tablet Take 100-200 mg by mouth See admin instructions. Takes 1 tablet in the morning and 1/2 tablet at night.     losartan (COZAAR) 25 MG tablet Take 25 mg by mouth daily. Take once daily at  bedtime     magnesium oxide (MAG-OX) 400 MG tablet Take 400 mg by mouth 2 (two) times daily.     methylPREDNISolone (MEDROL DOSEPAK) 4 MG TBPK tablet Please use as directed on the  bottle. 21 tablet 0   mirtazapine (REMERON) 15 MG tablet Take by mouth.     Multiple Vitamins-Minerals (MULTIVITAMIN ADULT PO) Take 1 tablet by mouth daily.     mycophenolate (CELLCEPT) 500 MG tablet Take 1,500 mg by mouth 2 (two) times daily.     rivaroxaban (XARELTO) 20 MG TABS tablet Take 1 tablet (20 mg total) by mouth daily with supper. (Patient taking differently: Take 20 mg by mouth daily.) 30 tablet 1   tacrolimus (PROGRAF) 1 MG capsule Take by mouth.     Vitamin D, Ergocalciferol, (DRISDOL) 50000 units CAPS capsule Take 1 capsule (50,000 Units total) by mouth 2 (two) times a week. (Patient taking differently: Take 50,000 Units by mouth 2 (two) times a week. Monday & Wednesday) 24 capsule 0   lisinopril (ZESTRIL) 20 MG tablet Take 20 mg by mouth daily.      No facility-administered medications prior to visit.    Allergies  Allergen Reactions   Lisinopril     Other reaction(s): Angioedema (ALLERGY/intolerance)   Latex Rash  ROS Review of Systems    Objective:    Physical Exam HENT:     Head: Normocephalic and atraumatic.     Right Ear: Tympanic membrane normal.     Nose: Nose normal.  Cardiovascular:     Rate and Rhythm: Normal rate and regular rhythm.     Pulses: Normal pulses.     Heart sounds: Normal heart sounds.  Pulmonary:     Effort: Pulmonary effort is normal.     Breath sounds: Normal breath sounds.  Abdominal:     Palpations: Abdomen is soft.     Comments: Hypoactive  Musculoskeletal:        General: Normal range of motion.     Cervical back: Normal range of motion.     Right lower leg: No edema.     Left lower leg: No edema.  Skin:    General: Skin is warm and dry.     Capillary Refill: Capillary refill takes less than 2 seconds.     Comments: Dark discoloration to face upper extremities and back  Neurological:     General: No focal deficit present.     Mental Status: She is alert and oriented to person, place, and time.  Psychiatric:         Mood and Affect: Mood normal.        Behavior: Behavior normal.        Thought Content: Thought content normal.        Judgment: Judgment normal.    BP (!) 138/96   Pulse 88   Temp 97.9 F (36.6 C)   Ht _0  (1.575 m)   Wt 159 lb 0.4 oz (72.1 kg)   SpO2 96%   BMI 29.09 kg/m  Wt Readings from Last 3 Encounters:  07/08/21 159 lb 0.4 oz (72.1 kg)  07/07/21 160 lb 8 oz (72.8 kg)  03/09/21 142 lb 1.3 oz (64.4 kg)     Health Maintenance Due  Topic Date Due   COVID-19 Vaccine (1) Never done   Hepatitis C Screening  Never done   TETANUS/TDAP  Never done   PAP-Cervical Cytology Screening  02/15/2020   PAP SMEAR-Modifier  02/15/2020    There are no preventive care reminders to display for this patient.  Lab Results  Component Value Date   TSH 1.180 12/07/2019   Lab Results  Component Value Date   WBC 11.4 (H) 07/07/2021   HGB 11.8 (L) 07/07/2021   HCT 35.4 (L) 07/07/2021   MCV 84.7 07/07/2021   PLT 409 (H) 07/07/2021   Lab Results  Component Value Date   NA 136 03/09/2021   K 4.0 03/09/2021   CO2 27 03/09/2021   GLUCOSE 85 03/09/2021   BUN 15 03/09/2021   CREATININE 0.84 03/09/2021   BILITOT 0.3 03/09/2021   ALKPHOS 107 03/09/2021   AST 22 03/09/2021   ALT 16 03/09/2021   PROT 8.5 (H) 03/09/2021   ALBUMIN 3.0 (L) 03/09/2021   CALCIUM 9.4 03/09/2021   ANIONGAP 6 03/09/2021   No results found for: CHOL No results found for: HDL No results found for: LDLCALC No results found for: TRIG No results found for: CHOLHDL Lab Results  Component Value Date   HGBA1C 4.5 (L) 03/20/2019      Assessment & Plan:   Problem List Items Addressed This Visit       Cardiovascular and Mediastinum   Essential hypertension - Primary Worsening Started Losartan 25 mg  Encouraged on going compliance with current  medication regimen Encouraged home monitoring and recording BP <130/80 Eating a heart-healthy diet with less salt Encouraged regular physical activity   Recommend Weight loss     Other   SLE (systemic lupus erythematosus) (HCC) (Chronic) Enourage patient to keep all follow-up visits with your [facility] Continues treatment regimen as directed    Other Visit Diagnoses     Flu vaccine need       Relevant Orders   Flu Vaccine QUAD 6+ mos PF IM (Fluarix Quad PF) (Completed)       Meds ordered this encounter  Medications   Blood Pressure Monitor KIT    Sig: 1 kit by Does not apply route daily.    Dispense:  1 kit    Refill:  0    Order Specific Question:   Supervising Provider    Answer:   Tresa Garter [id number]    Follow-up: Return in about 6 months (around 01/06/2022) for Follow up HTN [id number].    Vevelyn Francois, NP

## 2021-07-08 NOTE — Patient Instructions (Signed)
Managing Your Hypertension Hypertension, also called high blood pressure, is when the force of the blood pressing against the walls of the arteries is too strong. Arteries are blood vessels that carry blood from your heart throughout your body. Hypertension forces the heart to work harder to pump blood and may cause the arteries tobecome narrow or stiff. Understanding blood pressure readings Your personal target blood pressure may vary depending on your medical conditions, your age, and other factors. A blood pressure reading includes a higher number over a lower number. Ideally, your blood pressure should be below 120/80. You should know that: The first, or top, number is called the systolic pressure. It is a measure of the pressure in your arteries as your heart beats. The second, or bottom number, is called the diastolic pressure. It is a measure of the pressure in your arteries as the heart relaxes. Blood pressure is classified into four stages. Based on your blood pressure reading, your health care provider may use the following stages to determine what type of treatment you need, if any. Systolic pressure and diastolicpressure are measured in a unit called mmHg. Normal Systolic pressure: below 120. Diastolic pressure: below 80. Elevated Systolic pressure: 120-129. Diastolic pressure: below 80. Hypertension stage 1 Systolic pressure: 130-139. Diastolic pressure: 80-89. Hypertension stage 2 Systolic pressure: 140 or above. Diastolic pressure: 90 or above. How can this condition affect me? Managing your hypertension is an important responsibility. Over time, hypertension can damage the arteries and decrease blood flow to important parts of the body, including the brain, heart, and kidneys. Having untreated or uncontrolled hypertension can lead to: A heart attack. A stroke. A weakened blood vessel (aneurysm). Heart failure. Kidney damage. Eye damage. Metabolic syndrome. Memory and  concentration problems. Vascular dementia. What actions can I take to manage this condition? Hypertension can be managed by making lifestyle changes and possibly by taking medicines. Your health care provider will help you make a plan to bring yourblood pressure within a normal range. Nutrition  Eat a diet that is high in fiber and potassium, and low in salt (sodium), added sugar, and fat. An example eating plan is called the Dietary Approaches to Stop Hypertension (DASH) diet. To eat this way: Eat plenty of fresh fruits and vegetables. Try to fill one-half of your plate at each meal with fruits and vegetables. Eat whole grains, such as whole-wheat pasta, brown rice, or whole-grain bread. Fill about one-fourth of your plate with whole grains. Eat low-fat dairy products. Avoid fatty cuts of meat, processed or cured meats, and poultry with skin. Fill about one-fourth of your plate with lean proteins such as fish, chicken without skin, beans, eggs, and tofu. Avoid pre-made and processed foods. These tend to be higher in sodium, added sugar, and fat. Reduce your daily sodium intake. Most people with hypertension should eat less than 1,500 mg of sodium a day.  Lifestyle  Work with your health care provider to maintain a healthy body weight or to lose weight. Ask what an ideal weight is for you. Get at least 30 minutes of exercise that causes your heart to beat faster (aerobic exercise) most days of the week. Activities may include walking, swimming, or biking. Include exercise to strengthen your muscles (resistance exercise), such as weight lifting, as part of your weekly exercise routine. Try to do these types of exercises for 30 minutes at least 3 days a week. Do not use any products that contain nicotine or tobacco, such as cigarettes, e-cigarettes, and chewing   tobacco. If you need help quitting, ask your health care provider. Control any long-term (chronic) conditions you have, such as high  cholesterol or diabetes. Identify your sources of stress and find ways to manage stress. This may include meditation, deep breathing, or making time for fun activities.  Alcohol use Do not drink alcohol if: Your health care provider tells you not to drink. You are pregnant, may be pregnant, or are planning to become pregnant. If you drink alcohol: Limit how much you use to: 0-1 drink a day for women. 0-2 drinks a day for men. Be aware of how much alcohol is in your drink. In the [place] one drink equals one 12 oz bottle of beer (355 mL), one 5 oz glass of wine (148 mL), or one 1 oz glass of hard liquor (44 mL). Medicines Your health care provider may prescribe medicine if lifestyle changes are not enough to get your blood pressure under control and if: Your systolic blood pressure is 130 or higher. Your diastolic blood pressure is 80 or higher. Take medicines only as told by your health care provider. Follow the directions carefully. Blood pressure medicines must be taken as told by your health care provider. The medicine does not work as well when you skip doses. Skippingdoses also puts you at risk for problems. Monitoring Before you monitor your blood pressure: Do not smoke, drink caffeinated beverages, or exercise within 30 minutes before taking a measurement. Use the bathroom and empty your bladder (urinate). Sit quietly for at least 5 minutes before taking measurements. Monitor your blood pressure at home as told by your health care provider. To do this: Sit with your back straight and supported. Place your feet flat on the floor. Do not cross your legs. Support your arm on a flat surface, such as a table. Make sure your upper arm is at heart level. Each time you measure, take two or three readings one minute apart and record the results. You may also need to have your blood pressure checked regularly by your healthcare provider. General information Talk with your health care  provider about your diet, exercise habits, and other lifestyle factors that may be contributing to hypertension. Review all the medicines you take with your health care provider because there may be side effects or interactions. Keep all visits as told by your health care provider. Your health care provider can help you create and adjust your plan for managing your high blood pressure. Where to find more information National Heart, Lung, and Blood Institute: www.nhlbi.nih.gov American Heart Association: www.heart.org Contact a health care provider if: You think you are having a reaction to medicines you have taken. You have repeated (recurrent) headaches. You feel dizzy. You have swelling in your ankles. You have trouble with your vision. Get help right away if: You develop a severe headache or confusion. You have unusual weakness or numbness, or you feel faint. You have severe pain in your chest or abdomen. You vomit repeatedly. You have trouble breathing. These symptoms may represent a serious problem that is an emergency. Do not wait to see if the symptoms will go away. Get medical help right away. Call your local emergency services (911 in the [place] Do not drive yourself to the hospital. Summary Hypertension is when the force of blood pumping through your arteries is too strong. If this condition is not controlled, it may put you at risk for serious complications. Your personal target blood pressure may vary depending on your medical conditions,   your age, and other factors. For most people, a normal blood pressure is less than 120/80. Hypertension is managed by lifestyle changes, medicines, or both. Lifestyle changes to help manage hypertension include losing weight, eating a healthy, low-sodium diet, exercising more, stopping smoking, and limiting alcohol. This information is not intended to replace advice given to you by your health care provider. Make sure you discuss any questions  you have with your healthcare provider. Document Revised: 10/04/2019 Document Reviewed: 07/30/2019 Elsevier Patient Education  2022 [employer] Inc.  

## 2021-07-08 NOTE — Telephone Encounter (Signed)
Per 07/07/21 los - called and gave upcoming appointments - confirmed

## 2021-07-09 ENCOUNTER — Encounter: Payer: Self-pay | Admitting: *Deleted

## 2021-07-09 ENCOUNTER — Other Ambulatory Visit: Payer: Self-pay | Admitting: Family

## 2021-07-12 ENCOUNTER — Ambulatory Visit (INDEPENDENT_AMBULATORY_CARE_PROVIDER_SITE_OTHER): Payer: Medicare Other | Admitting: Clinical

## 2021-07-12 ENCOUNTER — Other Ambulatory Visit: Payer: Self-pay

## 2021-07-12 DIAGNOSIS — F411 Generalized anxiety disorder: Secondary | ICD-10-CM

## 2021-07-14 ENCOUNTER — Encounter: Payer: Self-pay | Admitting: Hematology & Oncology
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
## 2021-07-28 ENCOUNTER — Encounter: Payer: Self-pay | Admitting: Hematology & Oncology

## 2021-08-04 ENCOUNTER — Ambulatory Visit (HOSPITAL_COMMUNITY): Payer: Medicare Other | Admitting: Clinical

## 2021-08-09 ENCOUNTER — Other Ambulatory Visit: Payer: Self-pay | Admitting: Nurse Practitioner

## 2021-08-09 DIAGNOSIS — M329 Systemic lupus erythematosus, unspecified: Secondary | ICD-10-CM

## 2021-08-09 DIAGNOSIS — L819 Disorder of pigmentation, unspecified: Secondary | ICD-10-CM

## 2021-09-09 ENCOUNTER — Encounter: Payer: Self-pay | Admitting: Hematology & Oncology

## 2021-09-16 ENCOUNTER — Other Ambulatory Visit: Payer: Self-pay

## 2021-09-16 ENCOUNTER — Encounter (HOSPITAL_COMMUNITY): Payer: Self-pay | Admitting: Psychiatry

## 2021-09-16 ENCOUNTER — Telehealth (HOSPITAL_COMMUNITY): Payer: Self-pay | Admitting: Nurse Practitioner

## 2021-09-16 ENCOUNTER — Ambulatory Visit (INDEPENDENT_AMBULATORY_CARE_PROVIDER_SITE_OTHER): Payer: Medicare Other | Admitting: Psychiatry

## 2021-09-16 VITALS — BP 130/84 | HR 101 | Ht 63.0 in | Wt 163.0 lb

## 2021-09-16 DIAGNOSIS — F419 Anxiety disorder, unspecified: Secondary | ICD-10-CM | POA: Diagnosis not present

## 2021-09-16 DIAGNOSIS — F5101 Primary insomnia: Secondary | ICD-10-CM | POA: Diagnosis not present

## 2021-09-16 DIAGNOSIS — F0632 Mood disorder due to known physiological condition with major depressive-like episode: Secondary | ICD-10-CM | POA: Diagnosis not present

## 2021-09-16 MED ORDER — MIRTAZAPINE 30 MG PO TABS: 30.0000 mg | ORAL_TABLET | Freq: Every day | ORAL | 3 refills | Status: DC

## 2021-09-16 MED ORDER — BELSOMRA 5 MG PO TABS: 5.0000 mg | ORAL_TABLET | Freq: Every evening | ORAL | 3 refills | Status: AC | PRN

## 2021-09-16 MED ORDER — BELSOMRA 10 MG PO TABS: 10.0000 mg | ORAL_TABLET | Freq: Every evening | ORAL | 3 refills | Status: DC

## 2021-09-16 MED ORDER — DULOXETINE HCL 30 MG PO CPEP: 30.0000 mg | ORAL_CAPSULE | Freq: Two times a day (BID) | ORAL | 3 refills | Status: AC

## 2021-09-16 NOTE — Progress Notes (Addendum)
BH MD/PA/NP OP Progress Note  09/16/2021 9:40 AM [name] [name]  MRN:  [id number]  Chief Complaint: "The anxiety and sleep medications are good but other medications needs to be changed" Chief Complaint   Medication Management     HPI:  26 year old female seen today for follow-up psychiatric evaluation.  She has a medical history systemic lupus erythematosus, Raynaud's disease, hypertension, anemia of chronic disease, glaucoma, gout pulmonary embolism and lower extremity DVT and a psychiatric history of depression and anxiety.  She is currently managed on Cymbalta 30 mg twice daily and Belsomra 5 mg nightly.  Patient notes that she never discontinued mirtazapine 15 and increased her dose to 30 mg. She notes that her medications are effective in managing her psychiatric conditions.   Today she is pleasant, cooperative, engaged in conversation, and maintains eye contact.  She informed provider that she feels her anxiety and sleep medications are effective however notes that she feels like her medications for her lupus is not effective.  She informed Probation officer that recently she was told by her MD that she should discontinue Hydromorphine noting that it is negatively impacting her lupus.  She informed Probation officer that she continues to be in pain most days.  She reports that recently her feet is hurting the most and quantifies her pain as a 7 out of 10.  She informed Probation officer that her physical comorbidities exacerbate her mental comorbidities.  She reports that she is able to cope with her anxiety/depression.  She informed Probation officer that what concerned her most is her father's health and her living situation.  Currently she is living with her sister and her sisters partner however notes that they will be moving soon.  She informed Probation officer that she plans to move within another sister and is considering using her disability to try to find housing.  Patient notes that she is uncertain how to go about this.  Provider referred  patient to care management team.   Patient notes that the above exacerbates her anxiety and depression.  Provider conducted a GAD-7 and patient scored 18, at her last visit she scored a 12.  Provider also conducted PHQ-9 and patient scored a 16, at her last visit she scored a 22.  She endorses adequate sleep (6 hours) and appetite.  Today she denies SI/HI/VAH, mania, or paranoia.   Patient informed Probation officer that she never discontinue mirtazapine.  Provider informed patient that mirtazapine should be taken with caution as she has issues with her liver she endorsed understanding and notes that she would like to discontinue it.  Provider recommended reducing dose to 15 mg foe a week and then discontinuing.  She will start Belsomra 5 mg nightly to help manage sleep.  She will continue Cymbalta as prescribed and will follow-up with outpatient counseling for therapy. No other concerns noted at this time.       Visit Diagnosis:    ICD-10-CM   1. 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 Anxiety  F41.9 DULoxetine (CYMBALTA) 30 MG capsule      Past Psychiatric History: MDD, anxiety, panic episodes  Past Medical History:  Past Medical History:  Diagnosis Date   Accidental methotrexate overdose 01/08/2018   Acute lower UTI    Bronchitis 03/19/2019   Cardiomegaly 05/08/2019   Chronic midline low back pain without sciatica 11/09/2016   Facet joint arthropathy   Dehydration 03/19/2019   Depo-Provera contraceptive status 08/11/2015  Depression 02/08/2017   Dysphagia 06/01/2018   Dysuria 04/19/2019   Elevated AST (SGOT) 03/19/2019   Essential hypertension 03/19/2019   Facial edema    Gout    Hypertension    IDA (iron deficiency anemia) 08/11/2015   Immunosuppressed status ([place])    Insomnia 01/29/2016   Leukopenia 01/10/2018   Lobar pneumonia ([place]) 06/07/2018   Lupus ([place])    Lupus (systemic lupus erythematosus)  ([place]) 05/15/8181   Metabolic acidosis 05/22/3715   Methotrexate toxicity    Mouth ulcers    Mucositis oral 01/12/2018   Odynophagia 06/01/2018   Oral thrush 03/19/2019   Pleuritic chest pain 06/05/2018   Primary osteoarthritis of both knees 11/09/2016   Rash due to systemic lupus erythematosus (SLE) ([place])    Raynaud disease    Raynaud's disease 08/11/2015   Relative acute adrenal insufficiency ([place]) 06/11/2018   Right leg DVT ([place]) 05/08/2019   Seasonal allergies 08/11/2015   Sepsis ([place]) 05/08/2019   Sinus tachycardia 03/19/2019   SLE (systemic lupus erythematosus) ([place]) 09/19/2016     Positive ANA, double-stranded DNA, Ro, RNP, RF, history of nasal ulcers, fatigue, anemia, arthritis, Raynauds   Thrush 06/01/2018   Vaginal candidiasis 01/12/2018   Vitamin D deficiency 11/09/2016    Past Surgical History:  Procedure Laterality Date   ORIF SHOULDER FRACTURE Left    TONSILLECTOMY      Family Psychiatric History: Mother-substance abuse issues and maternal aunts and uncles substance use  Family History:  Family History  Problem Relation Age of Onset   Aneurysm Mother    Stroke Sister    Seizures Sister    Lung cancer Maternal Aunt    Heart attack Paternal Uncle    Colon cancer Neg Hx    Esophageal cancer Neg Hx    Pancreatic cancer Neg Hx    Stomach cancer Neg Hx     Social History:  Social History   Socioeconomic History   Marital status: Single    Spouse name: Not on file   Number of children: Not on file   Years of education: Not on file   Highest education level: Not on file  Occupational History   Not on file  Tobacco Use   Smoking status: Never   Smokeless tobacco: Never  Vaping Use   Vaping Use: Never used  Substance and Sexual Activity   Alcohol use: No   Drug use: No   Sexual activity: Not Currently  Other Topics Concern   Not on file  Social History Narrative   Not on file   Social Determinants of Health   Financial Resource Strain: Not on file  Food  Insecurity: Not on file  Transportation Needs: Not on file  Physical Activity: Not on file  Stress: Not on file  Social Connections: Not on file    Allergies:  Allergies  Allergen Reactions   Lisinopril     Other reaction(s): Angioedema (ALLERGY/intolerance)   Latex Rash    Metabolic Disorder Labs: Lab Results  Component Value Date   HGBA1C 4.5 (L) 03/20/2019   MPG 82.45 03/20/2019   No results found for: PROLACTIN No results found for: CHOL, TRIG, HDL, CHOLHDL, VLDL, LDLCALC Lab Results  Component Value Date   TSH 1.180 12/07/2019   TSH 3.309 05/09/2019    Therapeutic Level Labs: No results found for: LITHIUM No results found for: VALPROATE No components found for:  CBMZ  Current Medications: Current Outpatient Medications  Medication Sig Dispense Refill   Blood Pressure Monitor KIT 1  kit by Does not apply route daily. 1 kit 0   etonogestrel (NEXPLANON) 68 MG IMPL implant 68 mg by Subdermal route continuous.     famotidine (PEPCID) 20 MG tablet Take 20 mg by mouth daily.     folic acid (FOLVITE) 1 MG tablet Take 1 tablet (1 mg total) by mouth at bedtime. (Patient taking differently: Take 1 mg by mouth daily.) 30 tablet 0   hydroxychloroquine (PLAQUENIL) 200 MG tablet Take 100-200 mg by mouth See admin instructions. Takes 1 tablet in the morning and 1/2 tablet at night.     losartan (COZAAR) 25 MG tablet Take 25 mg by mouth daily. Take once daily at  bedtime     magnesium oxide (MAG-OX) 400 MG tablet Take 400 mg by mouth 2 (two) times daily.     methylPREDNISolone (MEDROL DOSEPAK) 4 MG TBPK tablet Please use as directed on the bottle. 21 tablet 0   Multiple Vitamins-Minerals (MULTIVITAMIN ADULT PO) Take 1 tablet by mouth daily.     mycophenolate (CELLCEPT) 500 MG tablet Take 1,500 mg by mouth 2 (two) times daily.     rivaroxaban (XARELTO) 20 MG TABS tablet Take 1 tablet (20 mg total) by mouth daily with supper. (Patient taking differently: Take 20 mg by mouth daily.) 30  tablet 1   tacrolimus (PROGRAF) 1 MG capsule Take by mouth.     Vitamin D, Ergocalciferol, (DRISDOL) 50000 units CAPS capsule Take 1 capsule (50,000 Units total) by mouth 2 (two) times a week. (Patient taking differently: Take 50,000 Units by mouth 2 (two) times a week. Monday & Wednesday) 24 capsule 0   DULoxetine (CYMBALTA) 30 MG capsule Take 1 capsule (30 mg total) by mouth 2 (two) times daily. 60 capsule 3   mirtazapine (REMERON) 30 MG tablet Take 1 tablet (30 mg total) by mouth at bedtime. 30 tablet 3   No current facility-administered medications for this visit.     Musculoskeletal: Strength & Muscle Tone: within normal limits Gait & Station: normal Patient leans: N/A  Psychiatric Specialty Exam: Review of Systems  Blood pressure 130/84, pulse (!) 101, height '5\' 3"'  (1.6 m), weight 163 lb (73.9 kg).Body mass index is 28.87 kg/m.  General Appearance: Well Groomed  Eye Contact:  Good  Speech:  Clear and Coherent and Normal Rate  Volume:  Normal  Mood:  Anxious and Depressed, reports can cope with it  Affect:  Appropriate and Congruent  Thought Process:  Coherent, Goal Directed, and Linear  Orientation:  Full (Time, Place, and Person)  Thought Content: WDL and Logical   Suicidal Thoughts:  No  Homicidal Thoughts:  No  Memory:  Immediate;   Good Recent;   Good Remote;   Good  Judgement:  Good  Insight:  Good  Psychomotor Activity:  Normal  Concentration:  Concentration: Good and Attention Span: Good  Recall:  Good  Fund of Knowledge: Good  Language: Good  Akathisia:  No  Handed:  Right  AIMS (if indicated): not done  Assets:  Communication Skills Desire for Improvement Financial Resources/Insurance Housing Social Support  ADL's:  Intact  Cognition: WNL  Sleep:  Good   Screenings: GAD-7    Flowsheet Row Clinical Support from 09/16/2021 in [facility] from 06/17/2021 in [facility] Counselor  from 05/18/2021 in [facility] from 03/17/2021 in [facility]  Total GAD-7 Score '18 12 17 17      ' [phone]  Flowsheet Row Clinical Support from 09/16/2021 in [facility] Office Visit from 07/08/2021 in [facility] from 06/17/2021 in [facility] Counselor from 05/18/2021 in [facility] from 03/17/2021 in [facility]  PHQ-2 Total Score '6 2 6 5 5  ' PHQ-9 Total Score '16 17 22 16 21      ' Flowsheet Row Clinical Support from 06/17/2021 in [facility] Most recent reading at 06/17/2021  9:39 AM ED from 05/18/2021 in [facility] Most recent reading at 05/18/2021  7:00 PM ED from 05/18/2021 in [facility] [place] Most recent reading at 05/18/2021  6:13 PM  C-SSRS RISK CATEGORY No Risk No Risk No Risk        Assessment and Plan: Patient notes that she has been anxious and depressed however reports that she is able to cope with it.   Patient informed Probation officer that she never discontinue mirtazapine.  Provider informed patient that mirtazapine should be taken with caution as she has issues with her liver she endorsed understanding and notes that she would like to discontinue it.  Provider recommended reducing dose to 15 mg foe a week and then discontinuing.  She will start Belsomra 5 mg nightly to help manage sleep.  She will continue Cymbalta as prescribed   1. 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  Dispense: 60 capsule; Refill: 3  2. Anxiety  Continue- DULoxetine (CYMBALTA) 30 MG capsule; Take 1 capsule (30 mg total) by mouth 2 (two) times  daily.  Dispense: 60 capsule; Refill: 3  3. Insomnia due to medical condition  Increased- Suvorexant (BELSOMRA) 5 MG TABS; Take 5 mg by mouth at bedtime.  Dispense: 30 tablet; Refill: 2    Follow-up in 3 months Follow-up with therapy  Salley Slaughter, NP 09/16/2021, 9:40 AM

## 2021-09-16 NOTE — Addendum Note (Signed)
Addended by: Salley Slaughter on: 09/16/2021 03:33 PM   Modules accepted: Orders

## 2021-09-16 NOTE — BH Assessment (Signed)
Care Management -  Outpatient Services  Eulis Canner, NP requests that the writer contact the patient and provide housing resources due to the patient receiving a Section 8 Housing voucher  Writer attempted to make contact with patient today and was unsuccessful.  Writer left a HIPPA compliant voice message.

## 2021-09-30 ENCOUNTER — Ambulatory Visit (HOSPITAL_COMMUNITY): Payer: Medicare Other | Admitting: Clinical

## 2021-10-28 ENCOUNTER — Other Ambulatory Visit: Payer: Self-pay

## 2021-10-28 ENCOUNTER — Ambulatory Visit: Payer: Self-pay

## 2021-10-28 ENCOUNTER — Ambulatory Visit (INDEPENDENT_AMBULATORY_CARE_PROVIDER_SITE_OTHER): Payer: Medicare Other | Admitting: Clinical

## 2021-10-28 DIAGNOSIS — F330 Major depressive disorder, recurrent, mild: Secondary | ICD-10-CM
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
## 2021-10-30 NOTE — Plan of Care (Signed)
Client was in agreement with the treatment plan. °

## 2021-11-18 ENCOUNTER — Encounter: Payer: Self-pay | Admitting: Hematology & Oncology

## 2021-11-25 ENCOUNTER — Other Ambulatory Visit: Payer: Self-pay

## 2021-12-01 ENCOUNTER — Encounter: Payer: Self-pay | Admitting: Hematology & Oncology

## 2021-12-01 ENCOUNTER — Encounter (HOSPITAL_COMMUNITY): Payer: Self-pay

## 2021-12-01 ENCOUNTER — Telehealth (HOSPITAL_COMMUNITY): Payer: 59 | Admitting: Psychiatry

## 2021-12-01 ENCOUNTER — Ambulatory Visit: Payer: Medicaid Other

## 2021-12-29 ENCOUNTER — Encounter: Payer: Self-pay | Admitting: Hematology & Oncology

## 2021-12-30 ENCOUNTER — Encounter: Payer: Self-pay | Admitting: Hematology & Oncology

## 2022-01-04 ENCOUNTER — Other Ambulatory Visit: Payer: Self-pay

## 2022-01-04 ENCOUNTER — Emergency Department (HOSPITAL_COMMUNITY)
Admission: EM | Admit: 2022-01-04 | Discharge: 2022-01-04 | Disposition: A | Discharge status: Home / Self Care | Payer: 59 | Attending: Emergency Medicine | Admitting: Emergency Medicine

## 2022-01-04 DIAGNOSIS — R7309 Other abnormal glucose: Secondary | ICD-10-CM | POA: Insufficient documentation

## 2022-01-04 DIAGNOSIS — R7989 Other specified abnormal findings of blood chemistry: Secondary | ICD-10-CM | POA: Diagnosis not present

## 2022-01-04 DIAGNOSIS — S59912A Unspecified injury of left forearm, initial encounter: Secondary | ICD-10-CM | POA: Diagnosis present

## 2022-01-04 DIAGNOSIS — R770 Abnormality of albumin: Secondary | ICD-10-CM | POA: Diagnosis not present

## 2022-01-04 DIAGNOSIS — S50862A Insect bite (nonvenomous) of left forearm, initial encounter: Secondary | ICD-10-CM | POA: Insufficient documentation

## 2022-01-04 DIAGNOSIS — D649 Anemia, unspecified: Secondary | ICD-10-CM | POA: Diagnosis not present

## 2022-01-04 DIAGNOSIS — Z91040 Latex allergy status: Secondary | ICD-10-CM | POA: Insufficient documentation

## 2022-01-04 DIAGNOSIS — W57XXXA Bitten or stung by nonvenomous insect and other nonvenomous arthropods, initial encounter: Secondary | ICD-10-CM | POA: Diagnosis not present

## 2022-01-04 LAB — COMPREHENSIVE METABOLIC PANEL
ALT: 13 U/L (ref 0–44)
AST: 18 U/L (ref 15–41)
Albumin: 2.6 g/dL — ABNORMAL LOW (ref 3.5–5.0)
Alkaline Phosphatase: 77 U/L (ref 38–126)
Anion gap: 6 (ref 5–15)
BUN: 13 mg/dL (ref 6–20)
CO2: 18 mmol/L — ABNORMAL LOW (ref 22–32)
Calcium: 8.4 mg/dL — ABNORMAL LOW (ref 8.9–10.3)
Chloride: 111 mmol/L (ref 98–111)
Creatinine, Ser: 1.08 mg/dL — ABNORMAL HIGH (ref 0.44–1.00)
GFR, Estimated: >60 mL/min (ref >60)
Glucose, Bld: 105 mg/dL — ABNORMAL HIGH (ref 70–99)
Potassium: 3.5 mmol/L (ref 3.5–5.1)
Sodium: 135 mmol/L (ref 135–145)
Total Bilirubin: 0.4 mg/dL (ref 0.3–1.2)
Total Protein: 7.2 g/dL (ref 6.5–8.1)

## 2022-01-04 LAB — CBC
HCT: 32.5 % — ABNORMAL LOW (ref 36.0–46.0)
Hemoglobin: 10.8 g/dL — ABNORMAL LOW (ref 12.0–15.0)
MCH: 27.3 pg (ref 26.0–34.0)
MCHC: 33.2 g/dL (ref 30.0–36.0)
MCV: 82.1 fL (ref 80.0–100.0)
Platelets: 378 10*3/uL (ref 150–400)
RBC: 3.96 MIL/uL (ref 3.87–5.11)
RDW: 13.8 % (ref 11.5–15.5)
WBC: 8 10*3/uL (ref 4.0–10.5)
nRBC: 0 % (ref 0.0–0.2)

## 2022-01-04 MED ORDER — CEPHALEXIN 500 MG PO CAPS: 500.0000 mg | ORAL_CAPSULE | Freq: Four times a day (QID) | ORAL | 0 refills | Status: DC

## 2022-01-04 MED ORDER — FAMOTIDINE 20 MG PO TABS
20.0000 mg | ORAL_TABLET | Freq: Once | ORAL | Status: AC
  Administered 2022-01-04: 20 mg via ORAL
  Filled 2022-01-04: qty 1

## 2022-01-04 MED ORDER — DEXAMETHASONE SODIUM PHOSPHATE 10 MG/ML IJ SOLN
8.0000 mg | Freq: Once | INTRAMUSCULAR | Status: AC
  Administered 2022-01-04: 8 mg via INTRAMUSCULAR
  Filled 2022-01-04: qty 1

## 2022-01-04 MED ORDER — DIPHENHYDRAMINE HCL 25 MG PO CAPS
25.0000 mg | ORAL_CAPSULE | Freq: Once | ORAL | Status: AC
  Administered 2022-01-04: 25 mg via ORAL
  Filled 2022-01-04: qty 1

## 2022-01-04 NOTE — ED Triage Notes (Signed)
Pt here for insect bite to L forearm, pt does not remember getting bit/stung, said she noticed it this morning because her arm was itchy. Pt has obvious bite and redness to L forearm.  ?

## 2022-01-04 NOTE — ED Provider Notes (Signed)
?[facility] ?Provider Note ? ? ?CSN: [id number] ?Arrival date & time: 01/04/22  1859 ? ?  ? ?History ? ?Chief Complaint  ?Patient presents with  ? Insect Bite  ? ? ?[name] [name] is a 26 y.o. female. ? ?HPI ? ?Patient is a 26 year old female who presents to the emergency department due to to a possible insect bite to the left forearm.  Patient states that she was unsure of what bit her but noticed a point of inflammation earlier today to the left forearm with surrounding erythema.  She states that the erythema spread across the forearm up to the antecubital region.  She reports feeling "warm all over" but otherwise denies any fevers, chills, nausea, vomiting, abdominal pain, facial swelling, oral swelling, difficulty swallowing, chest pain, shortness of breath. ? ?  ? ?Home Medications ?Prior to Admission medications   ?Medication Sig Start Date End Date Taking? Authorizing Provider  ?cephALEXin (KEFLEX) 500 MG capsule Take 1 capsule (500 mg total) by mouth 4 (four) times daily. 01/04/22  Yes Rayna Sexton, PA-C  ?Blood Pressure Monitor KIT 1 kit by Does not apply route daily. 07/08/21 07/08/22  Vevelyn Francois, NP  ?DULoxetine (CYMBALTA) 30 MG capsule Take 1 capsule (30 mg total) by mouth 2 (two) times daily. 09/16/21   Salley Slaughter, NP  ?etonogestrel (NEXPLANON) 68 MG IMPL implant 68 mg by Subdermal route continuous. 06/25/20 06/13/23  [provider]  ?famotidine (PEPCID) 20 MG tablet Take 20 mg by mouth daily.    [provider]  ?folic acid (FOLVITE) 1 MG tablet Take 1 tablet (1 mg total) by mouth at bedtime. ?Patient taking differently: Take 1 mg by mouth daily. 03/25/19   Kayleen Memos, DO  ?hydroxychloroquine (PLAQUENIL) 200 MG tablet Take 100-200 mg by mouth See admin instructions. Takes 1 tablet in the morning and 1/2 tablet at night.    [provider]  ?losartan (COZAAR) 25 MG tablet Take 25 mg by mouth daily. Take once daily at   bedtime    [provider]  ?magnesium oxide (MAG-OX) 400 MG tablet Take 400 mg by mouth 2 (two) times daily.    [provider]  ?methylPREDNISolone (MEDROL DOSEPAK) 4 MG TBPK tablet Please use as directed on the bottle. 05/18/21   Mickie Hillier, PA-C  ?Multiple Vitamins-Minerals (MULTIVITAMIN ADULT PO) Take 1 tablet by mouth daily.    [provider]  ?mycophenolate (CELLCEPT) 500 MG tablet Take 1,500 mg by mouth 2 (two) times daily. 01/22/21   [provider]  ?rivaroxaban (XARELTO) 20 MG TABS tablet Take 1 tablet (20 mg total) by mouth daily with supper. ?Patient taking differently: Take 20 mg by mouth daily. 11/12/20   Volanda Napoleon, MD  ?Suvorexant (BELSOMRA) 5 MG TABS Take 5 mg by mouth at bedtime as needed. 09/16/21   Salley Slaughter, NP  ?tacrolimus (PROGRAF) 1 MG capsule Take by mouth. 07/02/21   [provider]  ?Vitamin D, Ergocalciferol, (DRISDOL) 50000 units CAPS capsule Take 1 capsule (50,000 Units total) by mouth 2 (two) times a week. ?Patient taking differently: Take 50,000 Units by mouth 2 (two) times a week. Monday & Wednesday 12/21/17   Bo Merino, MD  ?   ? ?Allergies    ?Lisinopril and Latex   ? ?Review of Systems   ?Review of Systems  ?All other systems reviewed and are negative. ?Ten systems reviewed and are negative for acute change, except as noted in the HPI.   ?  Physical Exam ?Updated Vital Signs ?BP (!) 138/97   Pulse 95   Temp 99.6 ?F (37.6 ?C) (Oral)   Resp 16   SpO2 100%  ?Physical Exam ?Vitals and nursing note reviewed.  ?Constitutional:   ?   General: She is not in acute distress. ?   Appearance: Normal appearance. She is not ill-appearing, toxic-appearing or diaphoretic.  ?HENT:  ?   Head: Normocephalic and atraumatic.  ?   Right Ear: External ear normal.  ?   Left Ear: External ear normal.  ?   Nose: Nose normal.  ?   Mouth/Throat:  ?   Mouth: Mucous membranes are moist.  ?   Pharynx: Oropharynx is clear. No oropharyngeal  exudate or posterior oropharyngeal erythema.  ?Eyes:  ?   Extraocular Movements: Extraocular movements intact.  ?Cardiovascular:  ?   Rate and Rhythm: Normal rate and regular rhythm.  ?   Pulses: Normal pulses.  ?   Heart sounds: Normal heart sounds. No murmur heard. ?  No friction rub. No gallop.  ?   Comments: RRR without M/R/G. ?Pulmonary:  ?   Effort: Pulmonary effort is normal. No respiratory distress.  ?   Breath sounds: Normal breath sounds. No stridor. No wheezing, rhonchi or rales.  ?   Comments: LCTAB.  No wheezing, rales, or rhonchi. ?Abdominal:  ?   General: Abdomen is flat.  ?   Palpations: Abdomen is soft.  ?   Tenderness: There is no abdominal tenderness.  ?Musculoskeletal:     ?   General: Normal range of motion.  ?   Cervical back: Normal range of motion and neck supple. No tenderness.  ?Skin: ?   General: Skin is warm and dry.  ?   Comments: Small puncture wound noted to the left forearm with a 2 cm papular region surrounding the puncture wound.  Surrounding erythema noted across the forearm.  Increased warmth noted in the region.  No drainage.  Distal sensation intact.  2+ radial pulse.  ?Neurological:  ?   General: No focal deficit present.  ?   Mental Status: She is alert and oriented to person, place, and time.  ?Psychiatric:     ?   Mood and Affect: Mood normal.     ?   Behavior: Behavior normal.  ? ?ED Results / Procedures / Treatments   ?Labs ?(all labs ordered are listed, but only abnormal results are displayed) ?Labs Reviewed  ?CBC - Abnormal; Notable for the following components:  ?    Result Value  ? Hemoglobin 10.8 (*)   ? HCT 32.5 (*)   ? All other components within normal limits  ?COMPREHENSIVE METABOLIC PANEL - Abnormal; Notable for the following components:  ? CO2 18 (*)   ? Glucose, Bld 105 (*)   ? Creatinine, Ser 1.08 (*)   ? Calcium 8.4 (*)   ? Albumin 2.6 (*)   ? All other components within normal limits  ? ?EKG ?None ? ?Radiology ?No results found. ? ?Procedures ?Procedures   ? ?Medications Ordered in ED ?Medications  ?dexamethasone (DECADRON) injection 8 mg (8 mg Intramuscular Given 01/04/22 2029)  ?diphenhydrAMINE (BENADRYL) capsule 25 mg (25 mg Oral Given 01/04/22 2028)  ?famotidine (PEPCID) tablet 20 mg (20 mg Oral Given 01/04/22 2028)  ? ?ED Course/ Medical Decision Making/ A&P ?Clinical Course as of 01/04/22 2206  ?Tue Jan 04, 2022  ?2124 Patient reassessed.  Rash appears stable.  We will continue to closely monitor. [LJ]  ?  ?Clinical  Course User Index ?[LJ] Rayna Sexton, PA-C  ? ?                        ?Medical Decision Making ?Risk ?Prescription drug management. ? ?Pt is a 26 y.o. female who presents to the emergency department due to a possible insect bite.  Patient noticed a small papular nodule on her left forearm earlier today and over the course of the day began noticing worsening redness and increased warmth surrounding the nodule.  Reports feeling warm but otherwise denies any somatic complaints. ? ?Labs: ?CBC with a hemoglobin of 10.18 and hematocrit of 32.5. ?CMP with a CO2 of 18, glucose of 105, creatinine 1.08, calcium of 8.4, albumin of 2.6. ? ?I, Rayna Sexton, PA-C, personally reviewed and evaluated these images and lab results as part of my medical decision-making. ? ?On my exam patient has a small puncture wound to the left forearm with a 2 cm papular region surrounding the puncture wound.  There is surrounding erythema noted across the forearm as well as some mild increased warmth.  Neurovascularly intact distal to the site.  Soft compartments.  No fluctuance noted.  Does not appear amenable to an I&D.  Appears most consistent with a hymenoptera sting.  No angioedema noted.  Readily handling secretions.  No wheezing.  Oxygen saturations at 100%.  Patient denying any difficulty swallowing, shortness of breath, nausea, vomiting, diarrhea.  She was given Decadron, Benadryl, as well as Pepcid.  She was reassessed on multiple occasions and her rash on the left  forearm appears stable. ? ?Patient appears stable for discharge at this time and she is agreeable.  Will discharge on a course of Keflex to cover for possible infection.  We discussed return precautions at length an

## 2022-01-04 NOTE — ED Provider Triage Note (Signed)
Emergency Medicine Provider Triage Evaluation Note ? ?[name] [name] , a 26 y.o. female  was evaluated in triage.  Pt complains of bug bite to the left forearm.  Patient unaware what bit her, she states that she woke up this morning with 2 cm wheal of inflammation with central ulceration.  Patient states that she has lupus, states that she is immunocompromised due to medication she takes.  She is unable to tell with the name of the medication that she takes.  Patient continues that she "feels warm all over".  Patient denies body aches, chills, nausea, vomiting, abdominal pain. ? ?Review of Systems  ?Positive:  ?Negative:  ? ?Physical Exam  ?BP (!) 138/97   Pulse 95   Temp 99.6 ?F (37.6 ?C) (Oral)   Resp 16   SpO2 100%  ?Gen:   Awake, no distress   ?Resp:  Normal effort  ?MSK:   Moves extremities without difficulty  ?Other:   ? ?Medical Decision Making  ?Medically screening exam initiated at 7:17 PM.  Appropriate orders placed.  [name] [name] was informed that the remainder of the evaluation will be completed by another provider, this initial triage assessment does not replace that evaluation, and the importance of remaining in the ED until their evaluation is complete. ? ? ?  ?Azucena Cecil, PA-C ?01/04/22 1918 ? ?

## 2022-01-04 NOTE — Discharge Instructions (Addendum)
I am prescribing you an antibiotic called Keflex.  Please take this 4 times per day for the next 5 days.  Please take this as prescribed. ? ?Please continue to monitor your symptoms closely.  If you develop any new or worsening symptoms whatsoever please come back to the emergency department for reevaluation. ?

## 2022-01-05 ENCOUNTER — Inpatient Hospital Stay (HOSPITAL_BASED_OUTPATIENT_CLINIC_OR_DEPARTMENT_OTHER): Payer: 59 | Admitting: Hematology & Oncology

## 2022-01-05 ENCOUNTER — Inpatient Hospital Stay: Payer: 59 | Attending: Hematology & Oncology

## 2022-01-05 ENCOUNTER — Other Ambulatory Visit: Payer: Self-pay

## 2022-01-05 ENCOUNTER — Encounter: Payer: Self-pay | Admitting: Hematology & Oncology

## 2022-01-05 VITALS — BP 146/94 | HR 68 | Temp 98.2°F | Resp 18 | Wt 174.0 lb

## 2022-01-05 DIAGNOSIS — R760 Raised antibody titer: Secondary | ICD-10-CM | POA: Insufficient documentation

## 2022-01-05 DIAGNOSIS — E611 Iron deficiency: Secondary | ICD-10-CM | POA: Insufficient documentation

## 2022-01-05 DIAGNOSIS — D500 Iron deficiency anemia secondary to blood loss (chronic): Secondary | ICD-10-CM | POA: Diagnosis not present

## 2022-01-05 DIAGNOSIS — I2699 Other pulmonary embolism without acute cor pulmonale: Secondary | ICD-10-CM

## 2022-01-05 DIAGNOSIS — Z7982 Long term (current) use of aspirin: Secondary | ICD-10-CM | POA: Diagnosis not present

## 2022-01-05 DIAGNOSIS — Z86718 Personal history of other venous thrombosis and embolism: Secondary | ICD-10-CM | POA: Diagnosis present

## 2022-01-05 DIAGNOSIS — I7300 Raynaud's syndrome without gangrene: Secondary | ICD-10-CM | POA: Diagnosis not present

## 2022-01-05 DIAGNOSIS — M329 Systemic lupus erythematosus, unspecified: Secondary | ICD-10-CM | POA: Diagnosis not present

## 2022-01-05 DIAGNOSIS — Z86711 Personal history of pulmonary embolism: Secondary | ICD-10-CM | POA: Diagnosis not present

## 2022-01-05 LAB — CBC WITH DIFFERENTIAL (CANCER CENTER ONLY)
Abs Immature Granulocytes: 0.09 10*3/uL — ABNORMAL HIGH (ref 0.00–0.07)
Basophils Absolute: 0 10*3/uL (ref 0.0–0.1)
Basophils Relative: 0 %
Eosinophils Absolute: 0 10*3/uL (ref 0.0–0.5)
Eosinophils Relative: 0 %
HCT: 33.1 % — ABNORMAL LOW (ref 36.0–46.0)
Hemoglobin: 10.8 g/dL — ABNORMAL LOW (ref 12.0–15.0)
Immature Granulocytes: 1 %
Lymphocytes Relative: 20 %
Lymphs Abs: 1.6 10*3/uL (ref 0.7–4.0)
MCH: 26.9 pg (ref 26.0–34.0)
MCHC: 32.6 g/dL (ref 30.0–36.0)
MCV: 82.3 fL (ref 80.0–100.0)
Monocytes Absolute: 0.3 10*3/uL (ref 0.1–1.0)
Monocytes Relative: 4 %
Neutro Abs: 6.2 10*3/uL (ref 1.7–7.7)
Neutrophils Relative %: 75 %
Platelet Count: 406 10*3/uL — ABNORMAL HIGH (ref 150–400)
RBC: 4.02 MIL/uL (ref 3.87–5.11)
RDW: 14 % (ref 11.5–15.5)
WBC Count: 8.3 10*3/uL (ref 4.0–10.5)
nRBC: 0 % (ref 0.0–0.2)

## 2022-01-05 LAB — RETICULOCYTES
Immature Retic Fract: 4.1 % (ref 2.3–15.9)
RBC.: 4.02 MIL/uL (ref 3.87–5.11)
Retic Count, Absolute: 47 10*3/uL (ref 19.0–186.0)
Retic Ct Pct: 1.2 % (ref 0.4–3.1)

## 2022-01-05 LAB — FERRITIN: Ferritin: 36 ng/mL (ref 11–307)

## 2022-01-05 NOTE — Progress Notes (Addendum)
?Hematology and Oncology Follow Up Visit ? ?[name] ?[id number] ?Nov 21, 1995 26 y.o. ?01/05/2022 ? ? ?Principle Diagnosis:  ?DVT of the right lower extremity by [place] and VQ scan indeterminate of PE in August/September 2020 ?Lupus anticoagulant positive --transient positivity. ?Iron deficiency ?  ?Current Therapy:        ?Oral iron supplement daily   ?Patient discontinued Xarelto and then aspirin ?  ?Interim History:  [name] [name] is here today for follow-up.  The surprising news is that she got some kind of insect bite yesterday on her left forearm.  This developed into cellulitis.  She went to the emergency room.  They put her on some oral antibiotic.  I think this was Keflex. ? ?She still has a firm nodule on the flexor aspect of her left forearm.  She has some swelling and erythema that was noted. ? ?She has been getting injections to her back.  She says she is going need to have a right heart catheter. ? ?She has had no fever with this cellulitis.  She has had no nausea or vomiting.  She had little bit of diarrhea. ? ?She has had no problems with leg swelling. ? ?She has had problems with fatigue.  Again, she is going to have a right heart catheter.  I do not know if she has pulmonary hypertension from a past pulmonary embolus. ? ?She does have lupus.  Her renal function seems to be doing pretty well right now.  She is followed by nephrology. ? ?Overall, I would have to say that her performance status is probably ECOG 1. ? ? ?Medications:  ?Allergies as of 01/05/2022   ? ?   Reactions  ? Lisinopril   ? Other reaction(s): Angioedema (ALLERGY/intolerance)  ? Latex Rash  ? ?  ? ?  ?Medication List  ?  ? ?  ? Accurate as of January 05, 2022 12:25 PM. If you have any questions, ask your nurse or doctor.  ?  ?  ? ?  ? ?STOP taking these medications   ? ?magnesium oxide 400 MG tablet ?Commonly known as: MAG-OX ?Stopped by: Volanda Napoleon, MD ?  ?methylPREDNISolone 4 MG Tbpk tablet ?Commonly known as: MEDROL DOSEPAK ?Stopped  by: Volanda Napoleon, MD ?  ? ?  ? ?TAKE these medications   ? ?Belsomra 5 MG Tabs ?Generic drug: Suvorexant ?Take 5 mg by mouth at bedtime as needed. ?  ?Blood Pressure Monitor Kit ?1 kit by Does not apply route daily. ?  ?cephALEXin 500 MG capsule ?Commonly known as: KEFLEX ?Take 1 capsule (500 mg total) by mouth 4 (four) times daily. ?  ?diphenhydrAMINE 25 MG tablet ?Commonly known as: [place] ?Take 25 mg by mouth as needed. ?  ?doxycycline 100 MG tablet ?Commonly known as: VIBRA-TABS ?Take 100 mg by mouth daily. ?  ?DULoxetine 30 MG capsule ?Commonly known as: Cymbalta ?Take 1 capsule (30 mg total) by mouth 2 (two) times daily. ?  ?famotidine 20 MG tablet ?Commonly known as: PEPCID ?Take 20 mg by mouth daily. ?What changed: Another medication with the same name was removed. Continue taking this medication, and follow the directions you see here. ?Changed by: Volanda Napoleon, MD ?  ?ferrous sulfate 325 (65 FE) MG tablet ?Take 1 tablet by mouth daily with breakfast. ?  ?fluconazole 200 MG tablet ?Commonly known as: DIFLUCAN ?Take 200 mg by mouth. ?  ?folic acid 1 MG tablet ?Commonly known as: FOLVITE ?Take 1 tablet (1 mg total) by mouth  at bedtime. ?What changed: when to take this ?  ?hydroxychloroquine 200 MG tablet ?Commonly known as: PLAQUENIL ?Take 100-200 mg by mouth See admin instructions. Takes 1 tablet in the morning and 1/2 tablet at night. ?  ?losartan 25 MG tablet ?Commonly known as: COZAAR ?Take 25 mg by mouth daily. Take once daily at  bedtime ?What changed: Another medication with the same name was removed. Continue taking this medication, and follow the directions you see here. ?Changed by: Volanda Napoleon, MD ?  ?magnesium 84 MG (7MEQ) Tbcr SR tablet ?Commonly known as: MAGTAB ?Take 84 mg by mouth daily. ?  ?mirtazapine 15 MG tablet ?Commonly known as: REMERON ?Take 15 mg by mouth daily. ?  ?MULTIVITAMIN ADULT PO ?Take 1 tablet by mouth daily. ?  ?mycophenolate 500 MG tablet ?Commonly known as:  CELLCEPT ?Take 1,500 mg by mouth 2 (two) times daily. ?  ?Nexplanon 68 MG Impl implant ?Generic drug: etonogestrel ?68 mg by Subdermal route continuous. ?  ?Peridex 0.12 % solution ?Generic drug: chlorhexidine ?15 mLs 2 (two) times daily. ?  ?rivaroxaban 20 MG Tabs tablet ?Commonly known as: XARELTO ?Take 1 tablet (20 mg total) by mouth daily with supper. ?What changed: when to take this ?  ?tacrolimus 1 MG capsule ?Commonly known as: PROGRAF ?Take by mouth. ?  ?terconazole 0.8 % vaginal cream ?Commonly known as: TERAZOL 3 ?SMARTSIG:1 Application Vaginal Every Night ?  ?Vitamin D (Ergocalciferol) 1.25 MG (50000 UNIT) Caps capsule ?Commonly known as: DRISDOL ?Take 1 capsule (50,000 Units total) by mouth 2 (two) times a week. ?What changed: additional instructions ?  ? ?  ? ? ?Allergies:  ?Allergies  ?Allergen Reactions  ? Lisinopril   ?  Other reaction(s): Angioedema (ALLERGY/intolerance)  ? Latex Rash  ? ? ?Past Medical History, Surgical history, Social history, and Family History were reviewed and updated. ? ?Review of Systems: ?Review of Systems  ?Constitutional:  Positive for malaise/fatigue.  ?HENT: Negative.    ?Eyes: Negative.   ?Respiratory:  Positive for shortness of breath.   ?Cardiovascular:  Positive for orthopnea.  ?Gastrointestinal:  Positive for diarrhea.  ?Genitourinary: Negative.   ?Musculoskeletal:  Positive for back pain.  ?Skin:  Positive for rash.  ?Neurological:  Positive for dizziness.  ?Endo/Heme/Allergies: Negative.   ?Psychiatric/Behavioral: Negative.    ? ? ?Physical Exam: ? weight is 174 lb (78.9 kg). Her oral temperature is 98.2 ?F (36.8 ?C). Her blood pressure is 146/94 (abnormal) and her pulse is 68. Her respiration is 18.  ? ?Wt Readings from Last 3 Encounters:  ?01/05/22 174 lb (78.9 kg)  ?07/08/21 159 lb 0.4 oz (72.1 kg)  ?07/07/21 160 lb 8 oz (72.8 kg)  ? ? ?Physical Exam ?Vitals reviewed.  ?HENT:  ?   Head: Normocephalic and atraumatic.  ?Eyes:  ?   Pupils: Pupils are equal, round,  and reactive to light.  ?Cardiovascular:  ?   Rate and Rhythm: Normal rate and regular rhythm.  ?   Heart sounds: Normal heart sounds.  ?Pulmonary:  ?   Effort: Pulmonary effort is normal.  ?   Breath sounds: Normal breath sounds.  ?Abdominal:  ?   General: Bowel sounds are normal.  ?   Palpations: Abdomen is soft.  ?Musculoskeletal:     ?   General: No tenderness or deformity. Normal range of motion.  ?   Cervical back: Normal range of motion.  ?   Comments: On her left forearm on the flexor aspect, there is a nodule.  It  is quite firm.  Has a central eschar.  There is erythema and swelling about this.  There is some erythema on the flexor aspect of her left forearm.  She has good pulses in the distal arteries.  ?Lymphadenopathy:  ?   Cervical: No cervical adenopathy.  ?Skin: ?   General: Skin is warm and dry.  ?   Findings: No erythema or rash.  ?Neurological:  ?   Mental Status: She is alert and oriented to person, place, and time.  ?Psychiatric:     ?   Behavior: Behavior normal.     ?   Thought Content: Thought content normal.     ?   Judgment: Judgment normal.  ? ? ? ?Lab Results  ?Component Value Date  ? WBC 8.3 01/05/2022  ? HGB 10.8 (L) 01/05/2022  ? HCT 33.1 (L) 01/05/2022  ? MCV 82.3 01/05/2022  ? PLT 406 (H) 01/05/2022  ? ?Lab Results  ?Component Value Date  ? FERRITIN 154 07/07/2021  ? IRON 32 (L) 07/07/2021  ? TIBC 224 (L) 07/07/2021  ? UIBC 192 07/07/2021  ? IRONPCTSAT 14 (L) 07/07/2021  ? ?Lab Results  ?Component Value Date  ? RETICCTPCT 1.2 01/05/2022  ? RBC 4.02 01/05/2022  ? ?No results found for: KPAFRELGTCHN, LAMBDASER, KAPLAMBRATIO ?No results found for: IGGSERUM, IGA, IGMSERUM ?No results found for: TOTALPROTELP, ALBUMINELP, A1GS, A2GS, BETS, BETA2SER, GAMS, MSPIKE, SPEI ?  Chemistry   ?   ?Component Value Date/Time  ? NA 135 01/04/2022 1928  ? NA 140 12/03/2019 1449  ? K 3.5 01/04/2022 1928  ? CL 111 01/04/2022 1928  ? CO2 18 (L) 01/04/2022 1928  ? BUN 13 01/04/2022 1928  ? BUN 11 12/03/2019  1449  ? CREATININE 1.08 (H) 01/04/2022 1928  ? CREATININE 0.84 03/09/2021 0813  ? CREATININE 0.57 12/11/2017 1018  ?    ?Component Value Date/Time  ? CALCIUM 8.4 (L) 01/04/2022 1928  ? ALKPHOS 77 04/25/

## 2022-01-06 ENCOUNTER — Ambulatory Visit (INDEPENDENT_AMBULATORY_CARE_PROVIDER_SITE_OTHER): Payer: 59 | Admitting: Nurse Practitioner

## 2022-01-06 ENCOUNTER — Ambulatory Visit: Payer: Self-pay | Admitting: Nurse Practitioner

## 2022-01-06 ENCOUNTER — Encounter: Payer: Self-pay | Admitting: Nurse Practitioner

## 2022-01-06 ENCOUNTER — Encounter (HOSPITAL_COMMUNITY): Payer: Self-pay

## 2022-01-06 ENCOUNTER — Ambulatory Visit (HOSPITAL_COMMUNITY)
Admission: EM | Admit: 2022-01-06 | Discharge: 2022-01-06 | Disposition: A | Discharge status: Home / Self Care | Payer: 59 | Attending: Physician Assistant | Admitting: Physician Assistant

## 2022-01-06 ENCOUNTER — Encounter: Payer: Self-pay | Admitting: Hematology & Oncology

## 2022-01-06 VITALS — BP 134/85 | Temp 98.4°F | Ht 62.0 in | Wt 174.4 lb

## 2022-01-06 DIAGNOSIS — S50862D Insect bite (nonvenomous) of left forearm, subsequent encounter: Secondary | ICD-10-CM | POA: Diagnosis not present

## 2022-01-06 DIAGNOSIS — L02414 Cutaneous abscess of left upper limb: Secondary | ICD-10-CM

## 2022-01-06 DIAGNOSIS — W57XXXD Bitten or stung by nonvenomous insect and other nonvenomous arthropods, subsequent encounter: Secondary | ICD-10-CM

## 2022-01-06 DIAGNOSIS — W57XXXA Bitten or stung by nonvenomous insect and other nonvenomous arthropods, initial encounter: Secondary | ICD-10-CM | POA: Insufficient documentation

## 2022-01-06 LAB — IRON AND IRON BINDING CAPACITY (CC-WL,HP ONLY)
Iron: 26 ug/dL — ABNORMAL LOW (ref 28–170)
Saturation Ratios: 8 % — ABNORMAL LOW (ref 10.4–31.8)
TIBC: 318 ug/dL (ref 250–450)
UIBC: 292 ug/dL (ref 148–442)

## 2022-01-06 MED ORDER — CEFTRIAXONE SODIUM 1 G IJ SOLR
1.0000 g | Freq: Once | INTRAMUSCULAR | Status: AC
  Administered 2022-01-06: 1 g via INTRAMUSCULAR

## 2022-01-06 MED ORDER — CEFTRIAXONE SODIUM 1 G IJ SOLR
INTRAMUSCULAR | Status: AC
  Filled 2022-01-06: qty 10

## 2022-01-06 MED ORDER — DOXYCYCLINE HYCLATE 100 MG PO TABS: 100.0000 mg | ORAL_TABLET | Freq: Two times a day (BID) | ORAL | 0 refills | Status: AC

## 2022-01-06 MED ORDER — PREDNISONE 10 MG PO TABS: ORAL_TABLET | ORAL | 0 refills | Status: DC

## 2022-01-06 MED ORDER — MULTIVITAMIN ADULT PO CHEW: 1.0000 | CHEWABLE_TABLET | Freq: Every day | ORAL | 0 refills | Status: DC

## 2022-01-06 MED ORDER — MUPIROCIN 2 % EX OINT: 1.0000 "application " | TOPICAL_OINTMENT | Freq: Every day | CUTANEOUS | 0 refills | Status: DC

## 2022-01-06 MED ORDER — LIDOCAINE HCL (PF) 1 % IJ SOLN
INTRAMUSCULAR | Status: AC
  Filled 2022-01-06: qty 2

## 2022-01-06 NOTE — Patient Instructions (Signed)
1. Insect bite of left forearm, subsequent encounter ? ?- doxycycline (VIBRA-TABS) 100 MG tablet; Take 1 tablet (100 mg total) by mouth 2 (two) times daily for 10 days.  Dispense: 20 tablet; Refill: 0 ?- predniSONE (DELTASONE) 10 MG tablet; Take 4 tabs for 2 days, then 3 tabs for 2 days, then 2 tabs for 2 days, then 1 tab for 2 days, then stop  Dispense: 20 tablet; Refill: 0 ? ?Follow up: ? ?Follow up in 6 months or sooner if needed ? ?Insect Bite, Adult ?An insect bite can make your skin red, itchy, and swollen. Some insects can spread disease to people with a bite. However, most insect bites do not lead to disease, and most are not serious. ?What are the causes? ?Insects may bite for many reasons, including: ?Hunger. ?To defend themselves. ?Insects that bite include: ?Spiders. ?Mosquitoes. ?Ticks. ?Fleas. ?Ants. ?Flies. ?Kissing bugs. ?Chiggers. ?What are the signs or symptoms? ?Symptoms of this condition include: ?Itching or pain in the bite area. ?Redness and swelling in the bite area. ?An open wound (skin ulcer). ?Symptoms often last for 2-4 days. ?In rare cases, a person may have a very bad allergic reaction (anaphylactic reaction) to a bite. Symptoms of an anaphylactic reaction may include: ?Feeling warm in the face (flushed). Your face may turn red. ?Itchy, red, swollen areas of skin (hives). ?Swelling of the: ?Eyes. ?Lips. ?Face. ?Mouth. ?Tongue. ?Throat. ?Trouble with any of these: ?Breathing. ?Talking. ?Swallowing. ?Loud breathing (wheezing). ?Feeling dizzy or light-headed. ?Passing out (fainting). ?Pain or cramps in your belly. ?Throwing up (vomiting). ?Watery poop (diarrhea). ?How is this treated? ?Treatment is usually not needed. Symptoms often go away on their own. When treatment is needed, it may involve: ?Putting a cream or lotion on the bite area. This helps with itching. ?Taking an antibiotic medicine. This treatment is needed if the bite area gets infected. ?Getting a tetanus shot, if you are not  up to date on this vaccine. ?Putting ice on the affected area. ?Using medicines called antihistamines. This treatment may be needed if you have itching or an allergic reaction to the insect bite. ?Giving yourself a shot of medicine (epinephrine) using an auto-injector "pen" if you have an anaphylactic reaction to a bite. Your doctor will teach you how to use this pen. ?Follow these instructions at home: ?Bite area care ? ?Do not scratch the bite area. ?Keep the bite area clean and dry. ?Wash the bite area every day with soap and water as told by your doctor. ?Check the bite area every day for signs of infection. Check for: ?Redness, swelling, or pain. ?Fluid or blood. ?Warmth. ?Pus or a bad smell. ?Managing pain, itching, and swelling ? ?You may put any of these on the bite area as told by your doctor: ?A paste made of baking soda and water. ?Cortisone cream. ?Calamine lotion. ?If told, put ice on the bite area. ?Put ice in a plastic bag. ?Place a towel between your skin and the bag. ?Leave the ice on for 20 minutes, 2-3 times a day. ?General instructions ?Apply or take over-the-counter and prescription medicines only as told by your doctor. ?If you were prescribed an antibiotic medicine, take or apply it as told by your doctor. Do not stop using the antibiotic even if your condition improves. ?Keep all follow-up visits as told by your doctor. This is important. ?How is this prevented? ?To help you have a lower risk of insect bites: ?When you are outside, wear clothing that covers your arms  and legs. ?Use insect repellent. The best insect repellents contain one of these: ?DEET. ?Picaridin. ?Oil of lemon eucalyptus (OLE). ?[id number]. ?Consider spraying your clothing with a pesticide called permethrin. Permethrin helps prevent insect bites. It works for several weeks and for up to 5-6 clothing washes. Do not apply permethrin directly to the skin. ?If your home windows do not have screens, think about putting some in. ?If  you will be sleeping in an area where there are mosquitoes, consider covering your sleeping area with a mosquito net. ?Contact a doctor if: ?You have redness, swelling, or pain in the bite area. ?You have fluid or blood coming from the bite area. ?The bite area feels warm to the touch. ?You have pus or a bad smell coming from the bite area. ?You have a fever. ?Get help right away if: ?You have joint pain. ?You have a rash. ?You feel more tired or sleepy than you normally do. ?You have neck pain. ?You have a headache. ?You feel weaker than you normally do. ?You have signs of an anaphylactic reaction. Signs may include: ?Feeling warm in the face. ?Itchy, red, swollen areas of skin. ?Swelling of your: ?Eyes. ?Lips. ?Face. ?Mouth. ?Tongue. ?Throat. ?Trouble with any of these: ?Breathing. ?Talking. ?Swallowing. ?Loud breathing. ?Feeling dizzy or light-headed. ?Passing out. ?Pain or cramps in your belly. ?Throwing up. ?Watery poop. ?These symptoms may be an emergency. Do not wait to see if the symptoms will go away. Do this right away: ?Use your auto-injector pen as you have been told. ?Get medical help. Call your local emergency services (911 in the [place] Do not drive yourself to the hospital. ?Summary ?An insect bite can make your skin red, itchy, and swollen. ?Treatment is usually not needed. Symptoms often go away on their own. ?Do not scratch the bite area. Keep it clean and dry. ?Ice can help with pain and itching from the bite. ?This information is not intended to replace advice given to you by your health care provider. Make sure you discuss any questions you have with your health care provider. ?Document Revised: 05/31/2021 Document Reviewed: 05/31/2021 ?Elsevier Patient Education ? [place]. ? ? ?

## 2022-01-06 NOTE — Progress Notes (Signed)
@Patient  ID: [name] [name], female    DOB: December 29, 1995, 26 y.o.   MRN: [id number] ? ?Chief Complaint  ?Patient presents with  ? Follow-up  ?  Pt is here for 6 months follow up. Pt stated she got bite by a spider 2 days ago pt stated the antibiotics is not working, it still burns and itch. Pt stated she still having trouble sleeping  ? ? ?Referring provider: ?Vevelyn Francois, NP ? ? ?HPI ? ? ?Patient presents today for 52-monthfollow-up.  She states that overall her health has been stable.  Patient was recently seen in the ED for an insect bite 2 days ago.  She was started on Keflex but states that this is not working.  She does have a large raised area to her left inner forearm.  She does have another large raised area that started underneath that region.  Patient has been compliant with Keflex but this has not helped.  Area has not drained any.  We discussed that she can go to the urgent care to possibly have I&D this afternoon.  We will order doxycycline and prednisone for this today.  Patient is up-to-date on blood work. Denies f/c/s, n/v/d, hemoptysis, PND, chest pain or edema. ? ? ? ? ?Allergies  ?Allergen Reactions  ? Lisinopril   ?  Other reaction(s): Angioedema (ALLERGY/intolerance)  ? Latex Rash  ? ? ?Immunization History  ?Administered Date(s) Administered  ? Influenza,inj,Quad PF,6+ Mos 08/11/2015, 07/27/2018, 05/17/2019, 07/08/2021  ? Pneumococcal Conjugate-13 05/13/2016, 05/28/2021  ? Pneumococcal Polysaccharide-23 05/29/2019  ? ? ?Past Medical History:  ?Diagnosis Date  ? Accidental methotrexate overdose 01/08/2018  ? Acute lower UTI   ? Bronchitis 03/19/2019  ? Cardiomegaly 05/08/2019  ? Chronic midline low back pain without sciatica 11/09/2016  ? Facet joint arthropathy  ? Dehydration 03/19/2019  ? Depo-Provera contraceptive status 08/11/2015  ? Depression 02/08/2017  ? Dysphagia 06/01/2018  ? Dysuria 04/19/2019  ? Elevated AST (SGOT) 03/19/2019  ? Essential hypertension 03/19/2019  ? Facial edema   ? Gout   ?  Hypertension   ? IDA (iron deficiency anemia) 08/11/2015  ? Immunosuppressed status ([place]   ? Insomnia 01/29/2016  ? Leukopenia 01/10/2018  ? Lobar pneumonia ([place] 06/07/2018  ? Lupus ([place]   ? Lupus (systemic lupus erythematosus) ([facility] 03/19/2019  ? Metabolic acidosis 72/03/7411 ? Methotrexate toxicity   ? Mouth ulcers   ? Mucositis oral 01/12/2018  ? Odynophagia 06/01/2018  ? Oral thrush 03/19/2019  ? Pleuritic chest pain 06/05/2018  ? Primary osteoarthritis of both knees 11/09/2016  ? Rash due to systemic lupus erythematosus (SLE) ([place]   ? Raynaud disease   ? Raynaud's disease 08/11/2015  ? Relative acute adrenal insufficiency ([place] 06/11/2018  ? Right leg DVT ([facility] 05/08/2019  ? Seasonal allergies 08/11/2015  ? Sepsis ([place] 05/08/2019  ? Sinus tachycardia 03/19/2019  ? SLE (systemic lupus erythematosus) ([place] 09/19/2016  ?   Positive ANA, double-stranded DNA, Ro, RNP, RF, history of nasal ulcers, fatigue, anemia, arthritis, Raynauds  ? Thrush 06/01/2018  ? Vaginal candidiasis 01/12/2018  ? Vitamin D deficiency 11/09/2016  ? ? ?Tobacco History: ?Social History  ? ?Tobacco Use  ?Smoking Status Never  ?Smokeless Tobacco Never  ? ?Counseling given: Not Answered ? ? ?Outpatient Encounter Medications as of 01/06/2022  ?Medication Sig  ? Blood Pressure Monitor KIT 1 kit by Does not apply route daily.  ? cephALEXin (KEFLEX) 500 MG capsule Take 1 capsule (500 mg total) by mouth 4 (four) times  daily.  ? diphenhydrAMINE (SOMINEX) 25 MG tablet Take 25 mg by mouth as needed.  ? doxycycline (VIBRA-TABS) 100 MG tablet Take 1 tablet (100 mg total) by mouth 2 (two) times daily for 10 days.  ? DULoxetine (CYMBALTA) 30 MG capsule Take 1 capsule (30 mg total) by mouth 2 (two) times daily.  ? etonogestrel (NEXPLANON) 68 MG IMPL implant 68 mg by Subdermal route continuous.  ? famotidine (PEPCID) 20 MG tablet Take 20 mg by mouth daily.  ? ferrous sulfate 325 (65 FE) MG tablet Take 1 tablet by mouth daily with breakfast.  ? fluconazole (DIFLUCAN) 200 MG tablet  Take 200 mg by mouth.  ? folic acid (FOLVITE) 1 MG tablet Take 1 tablet (1 mg total) by mouth at bedtime. (Patient taking differently: Take 1 mg by mouth daily.)  ? hydroxychloroquine (PLAQUENIL) 200 MG tablet Take 100-200 mg by mouth See admin instructions. Takes 1 tablet in the morning and 1/2 tablet at night.  ? losartan (COZAAR) 25 MG tablet Take 25 mg by mouth daily. Take once daily at  bedtime  ? magnesium (MAGTAB) 84 MG (7MEQ) TBCR SR tablet Take 84 mg by mouth daily.  ? mirtazapine (REMERON) 15 MG tablet Take 15 mg by mouth daily.  ? Multiple Vitamins-Minerals (MULTIVITAMIN ADULT) CHEW Chew 1 tablet by mouth daily.  ? mycophenolate (CELLCEPT) 500 MG tablet Take 1,500 mg by mouth 2 (two) times daily.  ? PERIDEX 0.12 % solution 15 mLs 2 (two) times daily.  ? predniSONE (DELTASONE) 10 MG tablet Take 4 tabs for 2 days, then 3 tabs for 2 days, then 2 tabs for 2 days, then 1 tab for 2 days, then stop  ? rivaroxaban (XARELTO) 20 MG TABS tablet Take 1 tablet (20 mg total) by mouth daily with supper. (Patient taking differently: Take 20 mg by mouth daily.)  ? Suvorexant (BELSOMRA) 5 MG TABS Take 5 mg by mouth at bedtime as needed.  ? tacrolimus (PROGRAF) 1 MG capsule Take by mouth.  ? Vitamin D, Ergocalciferol, (DRISDOL) 50000 units CAPS capsule Take 1 capsule (50,000 Units total) by mouth 2 (two) times a week. (Patient taking differently: Take 50,000 Units by mouth 2 (two) times a week. Monday & Wednesday)  ? [DISCONTINUED] doxycycline (VIBRA-TABS) 100 MG tablet Take 100 mg by mouth daily.  ? [DISCONTINUED] Multiple Vitamins-Minerals (MULTIVITAMIN ADULT PO) Take 1 tablet by mouth daily.  ? terconazole (TERAZOL 3) 0.8 % vaginal cream SMARTSIG:1 Application Vaginal Every Night (Patient not taking: Reported on 01/06/2022)  ? ?No facility-administered encounter medications on file as of 01/06/2022.  ? ? ? ?Review of Systems ? ?Review of Systems  ?Constitutional: Negative.   ?HENT: Negative.    ?Cardiovascular: Negative.    ?Gastrointestinal: Negative.   ?Skin:   ?     2 raised red areas to left forearm that are warm and tender to the touch.  ?Allergic/Immunologic: Negative.   ?Neurological: Negative.   ?Psychiatric/Behavioral: Negative.     ? ? ? ?Physical Exam ? ?BP 134/85 (BP Location: Right Arm, Patient Position: Sitting, Cuff Size: Normal)   Temp 98.4 ?F (36.9 ?C)   Ht 5' 2"  (1.575 m)   Wt 174 lb 6 oz (79.1 kg)   BMI 31.89 kg/m?  ? ?Wt Readings from Last 5 Encounters:  ?01/06/22 174 lb 6 oz (79.1 kg)  ?01/05/22 174 lb (78.9 kg)  ?07/08/21 159 lb 0.4 oz (72.1 kg)  ?07/07/21 160 lb 8 oz (72.8 kg)  ?03/09/21 142 lb 1.3 oz (64.4 kg)  ? ? ? ?  Physical Exam ?Vitals and nursing note reviewed.  ?Constitutional:   ?   General: She is not in acute distress. ?   Appearance: She is well-developed.  ?Cardiovascular:  ?   Rate and Rhythm: Normal rate and regular rhythm.  ?Pulmonary:  ?   Effort: Pulmonary effort is normal.  ?   Breath sounds: Normal breath sounds.  ?Skin: ? ?    ?   Comments: 2 areas of induration to left forearm that are erythematous and warm to the touch. Tender to palpation.   ?Neurological:  ?   Mental Status: She is alert and oriented to person, place, and time.  ? ? ? ?Lab Results: ? ?CBC ?   ?Component Value Date/Time  ? WBC 8.3 01/05/2022 1111  ? WBC 8.0 01/04/2022 1928  ? RBC 4.02 01/05/2022 1111  ? RBC 4.02 01/05/2022 1110  ? HGB 10.8 (L) 01/05/2022 1111  ? HGB 9.2 (L) 12/03/2019 1449  ? HCT 33.1 (L) 01/05/2022 1111  ? HCT 28.6 (L) 12/03/2019 1449  ? PLT 406 (H) 01/05/2022 1111  ? PLT 174 12/03/2019 1449  ? MCV 82.3 01/05/2022 1111  ? MCV 81 12/03/2019 1449  ? MCH 26.9 01/05/2022 1111  ? MCHC 32.6 01/05/2022 1111  ? RDW 14.0 01/05/2022 1111  ? RDW 16.2 (H) 12/03/2019 1449  ? LYMPHSABS 1.6 01/05/2022 1111  ? LYMPHSABS 0.5 (L) 03/28/2018 1042  ? MONOABS 0.3 01/05/2022 1111  ? EOSABS 0.0 01/05/2022 1111  ? EOSABS 0.0 03/28/2018 1042  ? BASOSABS 0.0 01/05/2022 1111  ? BASOSABS 0.0 03/28/2018 1042  ? ? ?BMET ?    ?Component Value Date/Time  ? NA 135 01/04/2022 1928  ? NA 140 12/03/2019 1449  ? K 3.5 01/04/2022 1928  ? CL 111 01/04/2022 1928  ? CO2 18 (L) 01/04/2022 1928  ? GLUCOSE 105 (H) 01/04/2022 1928  ? BUN 13 01/04/2022 1

## 2022-01-06 NOTE — Assessment & Plan Note (Signed)
-   doxycycline (VIBRA-TABS) 100 MG tablet; Take 1 tablet (100 mg total) by mouth 2 (two) times daily for 10 days.  Dispense: 20 tablet; Refill: 0 ?- predniSONE (DELTASONE) 10 MG tablet; Take 4 tabs for 2 days, then 3 tabs for 2 days, then 2 tabs for 2 days, then 1 tab for 2 days, then stop  Dispense: 20 tablet; Refill: 0 ? ?Follow up: ? ?Follow up in 6 months or sooner if needed ?

## 2022-01-06 NOTE — ED Provider Notes (Signed)
?[facility] ? ? ? ?CSN: [id number] ?Arrival date & time: 01/06/22  1457 ? ? ?  ? ?History   ?Chief Complaint ?Chief Complaint  ?Patient presents with  ? Abscess  ? ? ?HPI ?[name] [name] is a 26 y.o. female.  ? ?Patient presents today with a several day history of enlarging redness on her left forearm.  She was seen on the emergency room 01/04/2022 at which point she was given Pepcid, Decadron, Benadryl without improvement of symptoms.  She was started on Keflex and discharged home.  She reports that erythema has improved but is not resolved.  She was seen by her PCP today who switched her to doxycycline 100 mg twice daily and recommended being seen at our clinic for I&D.  She does have a history of recurrent abscesses.  She denies additional antibiotics in the past 90 days.  She does have a history of lupus and is on immune modulating medication.  She denies any fever, nausea, vomiting.  Pain is rated 7 0-10 pain scale, described as burning, no aggravating relieving factors identified. ? ? ?Past Medical History:  ?Diagnosis Date  ? Accidental methotrexate overdose 01/08/2018  ? Acute lower UTI   ? Bronchitis 03/19/2019  ? Cardiomegaly 05/08/2019  ? Chronic midline low back pain without sciatica 11/09/2016  ? Facet joint arthropathy  ? Dehydration 03/19/2019  ? Depo-Provera contraceptive status 08/11/2015  ? Depression 02/08/2017  ? Dysphagia 06/01/2018  ? Dysuria 04/19/2019  ? Elevated AST (SGOT) 03/19/2019  ? Essential hypertension 03/19/2019  ? Facial edema   ? Gout   ? Hypertension   ? IDA (iron deficiency anemia) 08/11/2015  ? Immunosuppressed status ([place])   ? Insomnia 01/29/2016  ? Leukopenia 01/10/2018  ? Lobar pneumonia ([place]) 06/07/2018  ? Lupus ([place])   ? Lupus (systemic lupus erythematosus) ([place]) 03/19/2019  ? Metabolic acidosis 05/19/2835  ? Methotrexate toxicity   ? Mouth ulcers   ? Mucositis oral 01/12/2018  ? Odynophagia 06/01/2018  ? Oral thrush 03/19/2019  ? Pleuritic chest pain 06/05/2018  ? Primary osteoarthritis of  both knees 11/09/2016  ? Rash due to systemic lupus erythematosus (SLE) ([place])   ? Raynaud disease   ? Raynaud's disease 08/11/2015  ? Relative acute adrenal insufficiency ([place]) 06/11/2018  ? Right leg DVT ([place]) 05/08/2019  ? Seasonal allergies 08/11/2015  ? Sepsis ([place]) 05/08/2019  ? Sinus tachycardia 03/19/2019  ? SLE (systemic lupus erythematosus) ([place]) 09/19/2016  ?   Positive ANA, double-stranded DNA, Ro, RNP, RF, history of nasal ulcers, fatigue, anemia, arthritis, Raynauds  ? Thrush 06/01/2018  ? Vaginal candidiasis 01/12/2018  ? Vitamin D deficiency 11/09/2016  ? ? ?Patient Active Problem List  ? Diagnosis Date Noted  ? Insect bite of left forearm 01/06/2022  ? Menorrhagia 07/08/2021  ? 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 05/27/2020  ? Anxiety 05/27/2020  ? Exertional dyspnea 02/22/2020  ? PVD (peripheral vascular disease) ([place]) 02/18/2020  ? Exacerbation of systemic lupus erythematosus ([place]) 12/06/2019  ? SLE (systemic lupus erythematosus related syndrome) ([place]) 05/29/2019  ? Pulmonary hypertension ([place]) 05/28/2019  ? Lupus nephritis, ISN/RPS class III ([place]) 05/14/2019  ? Sepsis ([place]) 05/08/2019  ? DVT of deep femoral vein, right ([place]) 05/08/2019  ? Cardiomegaly 05/08/2019  ? Dysuria 04/19/2019  ? Facial edema   ? Rash due to systemic lupus erythematosus (SLE) ([place])   ? Immunosuppressed status ([place])   ? Acute lower UTI   ? Depressive disorder due to another  medical condition with depressive features 03/24/2019  ? Lupus (systemic lupus erythematosus) ([place]) 03/19/2019  ? Bronchitis 03/19/2019  ? Essential hypertension 03/19/2019  ? Elevated AST (SGOT) 03/19/2019  ? Dehydration 03/19/2019  ? Sinus tachycardia 03/19/2019  ? Metabolic acidosis 76/16/0737  ? Oral thrush 03/19/2019  ? Proteinuria 11/14/2018  ? Relative acute adrenal insufficiency ([place]) 06/11/2018  ? Hypomagnesemia 06/10/2018  ? Anemia 06/09/2018  ? Lobar pneumonia ([place]) 06/07/2018  ? Pleuritic chest pain  06/05/2018  ? Dysphagia 06/01/2018  ? Odynophagia 06/01/2018  ? Thrush 06/01/2018  ? Mucositis oral 01/12/2018  ? Vaginal candidiasis 01/12/2018  ? Methotrexate toxicity   ? Mouth ulcers   ? Leukopenia 01/10/2018  ? Accidental methotrexate overdose 01/08/2018  ? Gout 12/27/2017  ? Depression 02/08/2017  ? Primary osteoarthritis of both knees 11/09/2016  ? Chronic midline low back pain without sciatica 11/09/2016  ? Vitamin D deficiency 11/09/2016  ? SLE (systemic lupus erythematosus) ([place]) 09/19/2016  ? High risk medication use 09/19/2016  ? Insomnia 01/29/2016  ? Depo-Provera contraceptive status 08/11/2015  ? Raynaud's disease without gangrene 08/11/2015  ? Seasonal allergies 08/11/2015  ? IDA (iron deficiency anemia) 08/11/2015  ? Healthcare maintenance 04/19/2012  ? History of ongoing treatment with high-risk medication 04/19/2012  ? Undifferentiated connective tissue disease ([place]) 04/19/2012  ? Raynaud phenomenon 01/30/2012  ? ? ?Past Surgical History:  ?Procedure Laterality Date  ? ORIF SHOULDER FRACTURE Left   ? TONSILLECTOMY    ? ? ?OB History   ?No obstetric history on file. ?  ? ? ? ?Home Medications   ? ?Prior to Admission medications   ?Medication Sig Start Date End Date Taking? Authorizing Provider  ?mupirocin ointment (BACTROBAN) 2 % Apply 1 application. topically daily. 01/06/22  Yes [place] Winslow, Derry Skill, PA-C  ?Blood Pressure Monitor KIT 1 kit by Does not apply route daily. 07/08/21 07/08/22  Vevelyn Francois, NP  ?cephALEXin (KEFLEX) 500 MG capsule Take 1 capsule (500 mg total) by mouth 4 (four) times daily. 01/04/22   Rayna Sexton, PA-C  ?diphenhydrAMINE (SOMINEX) 25 MG tablet Take 25 mg by mouth as needed.    [provider]  ?doxycycline (VIBRA-TABS) 100 MG tablet Take 1 tablet (100 mg total) by mouth 2 (two) times daily for 10 days. 01/06/22 01/16/22  Fenton Foy, NP  ?DULoxetine (CYMBALTA) 30 MG capsule Take 1 capsule (30 mg total) by mouth 2 (two) times daily. 09/16/21   Salley Slaughter, NP  ?etonogestrel (NEXPLANON) 68 MG IMPL implant 68 mg by Subdermal route continuous. 06/25/20 06/13/23  [provider]  ?famotidine (PEPCID) 20 MG tablet Take 20 mg by mouth daily.    [provider]  ?ferrous sulfate 325 (65 FE) MG tablet Take 1 tablet by mouth daily with breakfast. 12/15/15   [provider]  ?fluconazole (DIFLUCAN) 200 MG tablet Take 200 mg by mouth. 12/16/21   [provider]  ?folic acid (FOLVITE) 1 MG tablet Take 1 tablet (1 mg total) by mouth at bedtime. ?Patient taking differently: Take 1 mg by mouth daily. 03/25/19   Kayleen Memos, DO  ?hydroxychloroquine (PLAQUENIL) 200 MG tablet Take 100-200 mg by mouth See admin instructions. Takes 1 tablet in the morning and 1/2 tablet at night.    [provider]  ?losartan (COZAAR) 25 MG tablet Take 25 mg by mouth daily. Take once daily at  bedtime    [provider]  ?magnesium (MAGTAB) 84 MG (7MEQ) TBCR SR tablet Take 84 mg by mouth daily.  11/23/21   [provider]  ?mirtazapine (REMERON) 15 MG tablet Take 15 mg by mouth daily. 12/17/20   [provider]  ?Multiple Vitamins-Minerals (MULTIVITAMIN ADULT) CHEW Chew 1 tablet by mouth daily. 01/06/22 02/05/22  Fenton Foy, NP  ?mycophenolate (CELLCEPT) 500 MG tablet Take 1,500 mg by mouth 2 (two) times daily. 01/22/21   [provider]  ?PERIDEX 0.12 % solution 15 mLs 2 (two) times daily. 11/10/21   [provider]  ?predniSONE (DELTASONE) 10 MG tablet Take 4 tabs for 2 days, then 3 tabs for 2 days, then 2 tabs for 2 days, then 1 tab for 2 days, then stop 01/06/22   Fenton Foy, NP  ?rivaroxaban (XARELTO) 20 MG TABS tablet Take 1 tablet (20 mg total) by mouth daily with supper. ?Patient taking differently: Take 20 mg by mouth daily. 11/12/20   Volanda Napoleon, MD  ?Suvorexant (BELSOMRA) 5 MG TABS Take 5 mg by mouth at bedtime as needed. 09/16/21   Salley Slaughter, NP  ?tacrolimus (PROGRAF) 1 MG capsule Take by  mouth. 07/02/21   [provider]  ?terconazole (TERAZOL 3) 0.8 % vaginal cream SMARTSIG:1 Application Vaginal Every Night ?Patient not taking: Reported on 01/06/2022 12/22/21   Provider, Historical, Jerilynn Mages

## 2022-01-06 NOTE — Addendum Note (Signed)
Addended by: Burney Gauze R on: 01/06/2022 04:18 PM ? ? Modules accepted: Orders ? ?

## 2022-01-06 NOTE — ED Triage Notes (Signed)
Pt states spider bite to left forearm for 2 days. ?Seen in the Er and started on antibiotics. States the redness and swelling is getting better but she wants it drained. Seen by her PMD today who advised her to come here. ?

## 2022-01-06 NOTE — Discharge Instructions (Signed)
We drained your abscess today.  Please keep clean with soap and water.  Apply Bactroban ointment with dressing changes.  We gave an injection of antibiotics.  Please start doxycycline prescribed your primary care as previously prescribed.  Use Tylenol ibuprofen for pain.  If you develop any additional symptoms including recurrent swelling, increased pain, fever, nausea, vomiting, red streaking of your skin you need to go to the emergency room immediately. ?

## 2022-01-07 ENCOUNTER — Telehealth: Payer: Self-pay | Admitting: *Deleted

## 2022-01-07 NOTE — Telephone Encounter (Signed)
As noted below by Dr. Marin Olp, I informed her that her iron level is very low. Dr. Marin Olp would like to see if you would agree and try some IV iron? She has agreed. LOS sent to scheduling. ?

## 2022-01-07 NOTE — Telephone Encounter (Signed)
-----   Message from Volanda Napoleon, MD sent at 01/06/2022  4:19 PM EDT ----- ?Call - her iron is very low!!  She needs IV iron if she will agree to it!!   Laurey Arrow ?

## 2022-01-13 ENCOUNTER — Inpatient Hospital Stay: Payer: 59 | Attending: Hematology & Oncology

## 2022-01-13 VITALS — BP 137/92 | HR 84 | Temp 98.6°F | Resp 17

## 2022-01-13 DIAGNOSIS — E611 Iron deficiency: Secondary | ICD-10-CM | POA: Diagnosis present

## 2022-01-13 DIAGNOSIS — D500 Iron deficiency anemia secondary to blood loss (chronic): Secondary | ICD-10-CM

## 2022-01-13 MED ORDER — SODIUM CHLORIDE 0.9 % IV SOLN
510.0000 mg | Freq: Once | INTRAVENOUS | Status: AC
  Administered 2022-01-13: 510 mg via INTRAVENOUS
  Filled 2022-01-13: qty 510

## 2022-01-13 NOTE — Patient Instructions (Signed)

## 2022-01-19 ENCOUNTER — Inpatient Hospital Stay: Payer: 59

## 2022-01-19 VITALS — BP 127/97 | HR 94 | Temp 98.4°F | Resp 17

## 2022-01-19 DIAGNOSIS — E611 Iron deficiency: Secondary | ICD-10-CM | POA: Diagnosis not present

## 2022-01-19 DIAGNOSIS — D500 Iron deficiency anemia secondary to blood loss (chronic): Secondary | ICD-10-CM

## 2022-01-19 MED ORDER — SODIUM CHLORIDE 0.9 % IV SOLN
510.0000 mg | Freq: Once | INTRAVENOUS | Status: AC
  Administered 2022-01-19: 510 mg via INTRAVENOUS
  Filled 2022-01-19: qty 17

## 2022-01-19 MED ORDER — SODIUM CHLORIDE 0.9 % IV SOLN: Freq: Once | INTRAVENOUS | Status: AC

## 2022-01-19 NOTE — Patient Instructions (Signed)

## 2022-01-26 ENCOUNTER — Other Ambulatory Visit: Payer: Self-pay

## 2022-01-26 DIAGNOSIS — M170 Bilateral primary osteoarthritis of knee: Secondary | ICD-10-CM

## 2022-01-26 MED ORDER — MULTIVITAMIN ADULT PO CHEW: 1.0000 | CHEWABLE_TABLET | Freq: Every day | ORAL | 1 refills | Status: AC

## 2022-02-09 ENCOUNTER — Inpatient Hospital Stay (HOSPITAL_BASED_OUTPATIENT_CLINIC_OR_DEPARTMENT_OTHER): Payer: 59 | Admitting: Hematology & Oncology

## 2022-02-09 ENCOUNTER — Encounter: Payer: Self-pay | Admitting: Hematology & Oncology

## 2022-02-09 ENCOUNTER — Other Ambulatory Visit: Payer: Self-pay

## 2022-02-09 ENCOUNTER — Inpatient Hospital Stay: Payer: 59

## 2022-02-09 VITALS — BP 136/98 | HR 58 | Temp 98.1°F | Resp 18 | Ht 62.0 in | Wt 172.1 lb

## 2022-02-09 DIAGNOSIS — E611 Iron deficiency: Secondary | ICD-10-CM | POA: Diagnosis not present

## 2022-02-09 DIAGNOSIS — D500 Iron deficiency anemia secondary to blood loss (chronic): Secondary | ICD-10-CM

## 2022-02-09 DIAGNOSIS — I82411 Acute embolism and thrombosis of right femoral vein: Secondary | ICD-10-CM

## 2022-02-09 LAB — CMP (CANCER CENTER ONLY)
ALT: 10 U/L (ref 0–44)
AST: 16 U/L (ref 15–41)
Albumin: 3.4 g/dL — ABNORMAL LOW (ref 3.5–5.0)
Alkaline Phosphatase: 62 U/L (ref 38–126)
Anion gap: 6 (ref 5–15)
BUN: 18 mg/dL (ref 6–20)
CO2: 26 mmol/L (ref 22–32)
Calcium: 9 mg/dL (ref 8.9–10.3)
Chloride: 108 mmol/L (ref 98–111)
Creatinine: 1.25 mg/dL — ABNORMAL HIGH (ref 0.44–1.00)
GFR, Estimated: >60 mL/min (ref >60)
Glucose, Bld: 86 mg/dL (ref 70–99)
Potassium: 4.6 mmol/L (ref 3.5–5.1)
Sodium: 140 mmol/L (ref 135–145)
Total Bilirubin: 0.2 mg/dL — ABNORMAL LOW (ref 0.3–1.2)
Total Protein: 8.1 g/dL (ref 6.5–8.1)

## 2022-02-09 LAB — RETICULOCYTES
Immature Retic Fract: 5.1 % (ref 2.3–15.9)
RBC.: 4.07 MIL/uL (ref 3.87–5.11)
Retic Count, Absolute: 63.1 10*3/uL (ref 19.0–186.0)
Retic Ct Pct: 1.6 % (ref 0.4–3.1)

## 2022-02-09 LAB — CBC WITH DIFFERENTIAL (CANCER CENTER ONLY)
Abs Immature Granulocytes: 0.02 10*3/uL (ref 0.00–0.07)
Basophils Absolute: 0 10*3/uL (ref 0.0–0.1)
Basophils Relative: 0 %
Eosinophils Absolute: 0.1 10*3/uL (ref 0.0–0.5)
Eosinophils Relative: 1 %
HCT: 34.7 % — ABNORMAL LOW (ref 36.0–46.0)
Hemoglobin: 11.1 g/dL — ABNORMAL LOW (ref 12.0–15.0)
Immature Granulocytes: 0 %
Lymphocytes Relative: 32 %
Lymphs Abs: 1.7 10*3/uL (ref 0.7–4.0)
MCH: 27 pg (ref 26.0–34.0)
MCHC: 32 g/dL (ref 30.0–36.0)
MCV: 84.4 fL (ref 80.0–100.0)
Monocytes Absolute: 0.3 10*3/uL (ref 0.1–1.0)
Monocytes Relative: 6 %
Neutro Abs: 3.2 10*3/uL (ref 1.7–7.7)
Neutrophils Relative %: 61 %
Platelet Count: 344 10*3/uL (ref 150–400)
RBC: 4.11 MIL/uL (ref 3.87–5.11)
RDW: 15.1 % (ref 11.5–15.5)
WBC Count: 5.3 10*3/uL (ref 4.0–10.5)
nRBC: 0 % (ref 0.0–0.2)

## 2022-02-09 LAB — FERRITIN: Ferritin: 365 ng/mL — ABNORMAL HIGH (ref 11–307)

## 2022-02-09 NOTE — Progress Notes (Signed)
Hematology and Oncology Follow Up Visit  [name] [name] [id number] [birth year] 25 y.o. 02/09/2022   Principle Diagnosis:  DVT of the right lower extremity by [place] and VQ scan indeterminate of PE in August/September 2020 Lupus anticoagulant positive --transient positivity. Iron deficiency   Current Therapy:        IV iron --Feraheme given on 01/19/2022 Patient discontinued Xarelto and then aspirin   Interim History:  [name] [name] is here today for follow-up.  He is doing okay.  [name] [name] has pain in the bottom of [name] [name] feet.  [name] [name] thinks this might be gout.  I must that [name] [name] we will be awfully young to have gout.  I guess [name] [name] family doctor is helping to deal with this.  We did have to give [name] [name] IV iron.  This back in May.  We saw [name] [name] in April, [name] [name] ferritin was 36 with an iron saturation of 8%.  [name] [name] hemoglobin has improved with the IV iron.  We will see what [name] [name] iron studies show.  [name] [name] is on [name] [name] immunosuppressants for [name] [name] lupus.  [name] [name] is on Prograf and CellCept.  [name] [name] is also on hydroxychloroquine.  [name] [name] has had no change in bowel or bladder habits.  [name] [name] has occasional pain in the right lower leg.  [name] [name] had the thrombus in [name] [name] right lower leg.  [name] [name] does not want to have any scans right now.  [name] [name] thinks that the weather change is probably exacerbated some pain issues.  [name] [name] has had no bleeding.  Again [name] [name] is off anticoagulation.    [name] [name] has had no palpitations.  There is been no headaches.  Overall, I would have to say that [name] [name] performance status is probably ECOG 1.   Medications:  Allergies as of 02/09/2022       Reactions   Lisinopril Other (See Comments)    Angioedema    Latex Rash        Medication List        Accurate as of Feb 09, 2022 12:39 PM. If you have any questions, ask your nurse or doctor.          STOP taking these medications    cephALEXin 500 MG capsule Commonly known as: KEFLEX Stopped by: Volanda Napoleon, MD   diphenhydrAMINE 25 MG tablet Commonly known as:  [place] by: Volanda Napoleon, MD   predniSONE 10 MG tablet Commonly known as: DELTASONE Stopped by: Volanda Napoleon, MD   terconazole 0.8 % vaginal cream Commonly known as: TERAZOL 3 Stopped by: Volanda Napoleon, MD       TAKE these medications    Belsomra 5 MG Tabs Generic drug: Suvorexant Take 5 mg by mouth at bedtime as needed.   Blood Pressure Monitor Kit 1 kit by Does not apply route daily.   DULoxetine 30 MG capsule Commonly known as: Cymbalta Take 1 capsule (30 mg total) by mouth 2 (two) times daily.   famotidine 20 MG tablet Commonly known as: PEPCID Take 20 mg by mouth daily.   ferrous sulfate 325 (65 FE) MG tablet Take 1 tablet by mouth daily with breakfast.   fluconazole 200 MG tablet Commonly known as: DIFLUCAN Take 200 mg by mouth.   folic acid 1 MG tablet Commonly known as: FOLVITE Take 1 tablet (1 mg total) by mouth at bedtime. What changed: when to take this   hydroxychloroquine 200 MG tablet Commonly known as: PLAQUENIL Take 100-200 mg by mouth See admin instructions. Takes 1 tablet in the morning  and 1/2 tablet at night.   losartan 25 MG tablet Commonly known as: COZAAR Take 25 mg by mouth daily. Take once daily at  bedtime   magnesium 84 MG (7MEQ) Tbcr SR tablet Commonly known as: MAGTAB Take 84 mg by mouth daily.   mirtazapine 15 MG tablet Commonly known as: REMERON Take 15 mg by mouth daily.   Multivitamin Adult Chew Chew 1 tablet by mouth daily.   mupirocin ointment 2 % Commonly known as: BACTROBAN Apply 1 application. topically daily.   mycophenolate 500 MG tablet Commonly known as: CELLCEPT Take 1,500 mg by mouth 2 (two) times daily.   Nexplanon 68 MG Impl implant Generic drug: etonogestrel 68 mg by Subdermal route continuous.   Peridex 0.12 % solution Generic drug: chlorhexidine 15 mLs 2 (two) times daily.   rivaroxaban 20 MG Tabs tablet Commonly known as: XARELTO Take 1 tablet (20 mg total) by mouth daily  with supper. What changed: when to take this   tacrolimus 1 MG capsule Commonly known as: PROGRAF Take by mouth.   Vitamin D (Ergocalciferol) 1.25 MG (50000 UNIT) Caps capsule Commonly known as: DRISDOL Take 1 capsule (50,000 Units total) by mouth 2 (two) times a week. What changed: additional instructions        Allergies:  Allergies  Allergen Reactions   Lisinopril Other (See Comments)     Angioedema    Latex Rash    Past Medical History, Surgical history, Social history, and Family History were reviewed and updated.  Review of Systems: Review of Systems  Constitutional:  Positive for malaise/fatigue.  HENT: Negative.    Eyes: Negative.   Respiratory:  Positive for shortness of breath.   Cardiovascular:  Positive for orthopnea.  Gastrointestinal:  Positive for diarrhea.  Genitourinary: Negative.   Musculoskeletal:  Positive for back pain.  Skin:  Positive for rash.  Neurological:  Positive for dizziness.  Endo/Heme/Allergies: Negative.   Psychiatric/Behavioral: Negative.      Physical Exam:  height is $RemoveB'5\' 2"'bOSeBrLD$  (1.575 m) and weight is 172 lb 1.9 oz (78.1 kg). [name] [name] oral temperature is 98.1 F (36.7 C). [name] [name] blood pressure is 136/98 (abnormal) and [name] [name] pulse is 58 (abnormal). [name] [name] respiration is 18 and oxygen saturation is 100%.   Wt Readings from Last 3 Encounters:  02/09/22 172 lb 1.9 oz (78.1 kg)  01/06/22 174 lb 6 oz (79.1 kg)  01/05/22 174 lb (78.9 kg)    Physical Exam Vitals reviewed.  HENT:     Head: Normocephalic and atraumatic.  Eyes:     Pupils: Pupils are equal, round, and reactive to light.  Cardiovascular:     Rate and Rhythm: Normal rate and regular rhythm.     Heart sounds: Normal heart sounds.  Pulmonary:     Effort: Pulmonary effort is normal.     Breath sounds: Normal breath sounds.  Abdominal:     General: Bowel sounds are normal.     Palpations: Abdomen is soft.  Musculoskeletal:        General: No tenderness or deformity. Normal range  of motion.     Cervical back: Normal range of motion.     Comments: On [name] [name] left forearm on the flexor aspect, there is a nodule.  It is quite firm.  Has a central eschar.  There is erythema and swelling about this.  There is some erythema on the flexor aspect of [name] [name] left forearm.  [name] [name] has good pulses in the distal arteries.  Lymphadenopathy:     Cervical:  No cervical adenopathy.  Skin:    General: Skin is warm and dry.     Findings: No erythema or rash.  Neurological:     Mental Status: [name] [name] is alert and oriented to person, place, and time.  Psychiatric:        Behavior: Behavior normal.        Thought Content: Thought content normal.        Judgment: Judgment normal.     Lab Results  Component Value Date   WBC 5.3 02/09/2022   HGB 11.1 (L) 02/09/2022   HCT 34.7 (L) 02/09/2022   MCV 84.4 02/09/2022   PLT 344 02/09/2022   Lab Results  Component Value Date   FERRITIN 36 01/05/2022   IRON 26 (L) 01/05/2022   TIBC 318 01/05/2022   UIBC 292 01/05/2022   IRONPCTSAT 8 (L) 01/05/2022   Lab Results  Component Value Date   RETICCTPCT 1.6 02/09/2022   RBC 4.11 02/09/2022   RBC 4.07 02/09/2022   No results found for: KPAFRELGTCHN, LAMBDASER, KAPLAMBRATIO No results found for: IGGSERUM, IGA, IGMSERUM No results found for: Odetta Pink, SPEI   Chemistry      Component Value Date/Time   NA 140 02/09/2022 1141   NA 140 12/03/2019 1449   K 4.6 02/09/2022 1141   CL 108 02/09/2022 1141   CO2 26 02/09/2022 1141   BUN 18 02/09/2022 1141   BUN 11 12/03/2019 1449   CREATININE 1.25 (H) 02/09/2022 1141   CREATININE 0.57 12/11/2017 1018      Component Value Date/Time   CALCIUM 9.0 02/09/2022 1141   ALKPHOS 62 02/09/2022 1141   AST 16 02/09/2022 1141   ALT 10 02/09/2022 1141   BILITOT 0.2 (L) 02/09/2022 1141       Impression and Plan: [name] [name] is a very pleasant 26 yo African American female with history of systemic lupus  erythematosus and Raynaud's. [name] [name] was diagnosed with right lower extremity DVT back in August 2020.  We had [name] [name] on Xarelto.  [name] [name] stopped this herself.    Clearly, [name] [name] rheumatologic issues are the problem.  I am not sure if [name] [name] has gout.  Again, I would think [name] [name] family doctor will be the 1 to evaluate this.  [name] [name] does not wish to have any Dopplers of [name] [name] legs right now.  I certainly am willing to watch for right now.  [name] [name] exam is pretty much unrevealing.  There may be some chronic swelling in the right lower leg but no palpable venous cord is noted.  [name] [name] has a negative Homans sign.  Hopefully, we can get [name] [name] through the summer.  [name] [name] will let us know if there are any issues.  Volanda Napoleon, MD 5/31/202312:39 PM

## 2022-02-10 ENCOUNTER — Encounter: Payer: Self-pay | Admitting: *Deleted

## 2022-02-10 LAB — IRON AND IRON BINDING CAPACITY (CC-WL,HP ONLY)
Iron: 54 ug/dL (ref 28–170)
Saturation Ratios: 22 % (ref 10.4–31.8)
TIBC: 245 ug/dL — ABNORMAL LOW (ref 250–450)
UIBC: 191 ug/dL (ref 148–442)

## 2022-02-16 ENCOUNTER — Encounter: Payer: Self-pay | Admitting: Physician Assistant

## 2022-02-16 ENCOUNTER — Ambulatory Visit (INDEPENDENT_AMBULATORY_CARE_PROVIDER_SITE_OTHER): Payer: 59 | Admitting: Physician Assistant

## 2022-02-16 DIAGNOSIS — M329 Systemic lupus erythematosus, unspecified: Secondary | ICD-10-CM

## 2022-02-16 MED ORDER — TRIAMCINOLONE ACETONIDE 0.1 % EX CREA: TOPICAL_CREAM | CUTANEOUS | 0 refills | Status: AC

## 2022-02-17 ENCOUNTER — Other Ambulatory Visit: Payer: Self-pay | Admitting: Nurse Practitioner

## 2022-02-17 MED ORDER — MIRTAZAPINE 15 MG PO TABS: 15.0000 mg | ORAL_TABLET | Freq: Every day | ORAL | 2 refills | Status: DC

## 2022-02-21 ENCOUNTER — Ambulatory Visit (INDEPENDENT_AMBULATORY_CARE_PROVIDER_SITE_OTHER): Payer: 59 | Admitting: Nurse Practitioner

## 2022-02-21 ENCOUNTER — Encounter: Payer: Self-pay | Admitting: Physician Assistant

## 2022-02-21 VITALS — BP 121/89 | HR 94 | Temp 97.8°F | Ht 62.0 in | Wt 174.4 lb

## 2022-02-21 DIAGNOSIS — Z0000 Encounter for general adult medical examination without abnormal findings: Secondary | ICD-10-CM | POA: Diagnosis not present

## 2022-02-21 NOTE — Progress Notes (Signed)
Subjective:   [name] [name] is a 26 y.o. female who presents for an Initial Medicare Annual Wellness Visit.  Review of Systems    I connected with  Hanan Mcwilliams Winslow on 02/21/22 by a  in person patient came in to office   enabled telemedicine application and verified that I am speaking with the correct person using two identifiers.  Patient Location: Other:  Provider Office  Provider Location: Office/Clinic  I discussed the limitations of evaluation and management by telemedicine. The patient expressed understanding and agreed to proceed.        Objective:    There were no vitals filed for this visit. There is no height or weight on file to calculate BMI.     02/09/2022   12:16 PM 01/05/2022   11:48 AM 07/07/2021    2:10 PM 03/09/2021    8:53 AM 02/12/2021   12:09 PM 02/08/2021    9:34 AM 01/05/2021   11:33 AM  Advanced Directives  Does Patient Have a Medical Advance Directive? No No No No No No No  Would patient like information on creating a medical advance directive? No - Patient declined No - Patient declined No - Patient declined No - Patient declined   No - Patient declined    Current Medications (verified) Outpatient Encounter Medications as of 02/21/2022  Medication Sig   Blood Pressure Monitor KIT 1 kit by Does not apply route daily.   DULoxetine (CYMBALTA) 30 MG capsule Take 1 capsule (30 mg total) by mouth 2 (two) times daily.   etonogestrel (NEXPLANON) 68 MG IMPL implant 68 mg by Subdermal route continuous.   famotidine (PEPCID) 20 MG tablet Take 20 mg by mouth daily.   fluconazole (DIFLUCAN) 200 MG tablet Take 200 mg by mouth.   folic acid (FOLVITE) 1 MG tablet Take 1 tablet (1 mg total) by mouth at bedtime. (Patient taking differently: Take 1 mg by mouth daily.)   hydroxychloroquine (PLAQUENIL) 200 MG tablet Take 100-200 mg by mouth See admin instructions. Takes 1 tablet in the morning and 1/2 tablet at night.   losartan (COZAAR) 25 MG tablet Take 25 mg by mouth  daily. Take once daily at  bedtime   magnesium (MAGTAB) 84 MG (7MEQ) TBCR SR tablet Take 84 mg by mouth daily.   mirtazapine (REMERON) 15 MG tablet Take 1 tablet (15 mg total) by mouth daily.   Multiple Vitamins-Minerals (MULTIVITAMIN ADULT) CHEW Chew 1 tablet by mouth daily.   mupirocin ointment (BACTROBAN) 2 % Apply 1 application. topically daily.   mycophenolate (CELLCEPT) 500 MG tablet Take 1,500 mg by mouth 2 (two) times daily.   PERIDEX 0.12 % solution 15 mLs 2 (two) times daily.   Suvorexant (BELSOMRA) 5 MG TABS Take 5 mg by mouth at bedtime as needed.   tacrolimus (PROGRAF) 1 MG capsule Take by mouth.   triamcinolone cream (KENALOG) 0.1 % Apply to AA daily and follow up in 30 days   Vitamin D, Ergocalciferol, (DRISDOL) 50000 units CAPS capsule Take 1 capsule (50,000 Units total) by mouth 2 (two) times a week. (Patient taking differently: Take 50,000 Units by mouth 2 (two) times a week. Monday & Wednesday)   No facility-administered encounter medications on file as of 02/21/2022.    Allergies (verified) Lisinopril and Latex   History: Past Medical History:  Diagnosis Date   Accidental methotrexate overdose 01/08/2018   Acute lower UTI    Bronchitis 03/19/2019   Cardiomegaly 05/08/2019   Chronic midline low back pain  without sciatica 11/09/2016   Facet joint arthropathy   Dehydration 03/19/2019   Depo-Provera contraceptive status 08/11/2015   Depression 02/08/2017   Dysphagia 06/01/2018   Dysuria 04/19/2019   Elevated AST (SGOT) 03/19/2019   Essential hypertension 03/19/2019   Facial edema    Gout    Hypertension    IDA (iron deficiency anemia) 08/11/2015   Immunosuppressed status ([place])    Insomnia 01/29/2016   Leukopenia 01/10/2018   Lobar pneumonia ([place]) 06/07/2018   Lupus ([place])    Lupus (systemic lupus erythematosus) ([place]) 0/01/3975   Metabolic acidosis 03/14/4192   Methotrexate toxicity    Mouth ulcers    Mucositis oral 01/12/2018   Odynophagia 06/01/2018   Oral thrush 03/19/2019    Pleuritic chest pain 06/05/2018   Primary osteoarthritis of both knees 11/09/2016   Rash due to systemic lupus erythematosus (SLE) ([place])    Raynaud disease    Raynaud's disease 08/11/2015   Relative acute adrenal insufficiency ([place]) 06/11/2018   Right leg DVT ([place]) 05/08/2019   Seasonal allergies 08/11/2015   Sepsis ([place]) 05/08/2019   Sinus tachycardia 03/19/2019   SLE (systemic lupus erythematosus) ([place]) 09/19/2016     Positive ANA, double-stranded DNA, Ro, RNP, RF, history of nasal ulcers, fatigue, anemia, arthritis, Raynauds   Thrush 06/01/2018   Vaginal candidiasis 01/12/2018   Vitamin D deficiency 11/09/2016   Past Surgical History:  Procedure Laterality Date   ORIF SHOULDER FRACTURE Left    TONSILLECTOMY     Family History  Problem Relation Age of Onset   Aneurysm Mother    Stroke Sister    Seizures Sister    Lung cancer Maternal Aunt    Heart attack Paternal Uncle    Colon cancer Neg Hx    Esophageal cancer Neg Hx    Pancreatic cancer Neg Hx    Stomach cancer Neg Hx    Social History   Socioeconomic History   Marital status: Single    Spouse name: Not on file   Number of children: Not on file   Years of education: Not on file   Highest education level: Not on file  Occupational History   Not on file  Tobacco Use   Smoking status: Never   Smokeless tobacco: Never  Vaping Use   Vaping Use: Never used  Substance and Sexual Activity   Alcohol use: No   Drug use: No   Sexual activity: Not Currently  Other Topics Concern   Not on file  Social History Narrative   Not on file   Social Determinants of Health   Financial Resource Strain: Not on file  Food Insecurity: Not on file  Transportation Needs: Not on file  Physical Activity: Not on file  Stress: Not on file  Social Connections: Not on file    Tobacco Counseling Counseling given: Not Answered   Clinical Intake:                 Diabetic?NA         Activities of Daily Living     No data  to display           Patient Care Team: Fenton Foy, NP as PCP - General (Adult Health Nurse Practitioner) Nahser, Wonda Cheng, MD as PCP - Cardiology (Cardiology) Warren Danes, PA-C as Physician Assistant (Dermatology)  Indicate any recent Medical Services you may have received from other than [facility] providers in the past year (date may be approximate).     Assessment:   This  is a routine wellness examination for [place].  Hearing/Vision screen No results found.  Dietary issues and exercise activities discussed:     Goals Addressed   None   Depression Screen    01/06/2022    2:12 PM 09/16/2021    9:18 AM 07/08/2021    3:06 PM 06/17/2021    9:39 AM 05/20/2021    9:01 PM 03/17/2021    9:16 AM 12/24/2020   10:32 AM  PHQ 2/9 Scores  PHQ - 2 Score 0  2      PHQ- 9 Score   17      Exception Documentation       Medical reason     Information is confidential and restricted. Go to Review Flowsheets to unlock data.     Fall Risk    01/06/2022    2:12 PM 07/08/2021    3:06 PM 12/23/2020    9:05 AM 01/28/2020    8:27 AM 12/11/2019    9:50 AM  Fall Risk   Falls in the past year? 0 0 0 0 0  Number falls in past yr: 0 0 0    Injury with Fall? 0 0 0    Risk for fall due to : No Fall Risks  No Fall Risks    Follow up Falls evaluation completed        FALL RISK PREVENTION PERTAINING TO THE HOME:  Any stairs in or around the home? Yes  If so, are there any without handrails? No  Home free of loose throw rugs in walkways, pet beds, electrical cords, etc? Yes  Adequate lighting in your home to reduce risk of falls? Yes   ASSISTIVE DEVICES UTILIZED TO PREVENT FALLS:  Life alert? No  Use of a cane, walker or w/c? Yes  Grab bars in the bathroom? Yes  Shower chair or bench in shower? No  Elevated toilet seat or a handicapped toilet? No   TIMED UP AND GO:  Was the test performed? Yes .  Length of time to ambulate 10 feet: 10 sec.   Gait steady and fast without use of  assistive device  Cognitive Function:        Immunizations Immunization History  Administered Date(s) Administered   Influenza,inj,Quad PF,6+ Mos 08/11/2015, 07/27/2018, 05/17/2019, 07/08/2021   Pneumococcal Conjugate-13 05/13/2016, 05/28/2021   Pneumococcal Polysaccharide-23 05/29/2019    TDAP status: Due, Education has been provided regarding the importance of this vaccine. Advised may receive this vaccine at local pharmacy or Health Dept. Aware to provide a copy of the vaccination record if obtained from local pharmacy or Health Dept. Verbalized acceptance and understanding.  Flu Vaccine status: Up to date    Covid-19 vaccine status: Declined, Education has been provided regarding the importance of this vaccine but patient still declined. Advised may receive this vaccine at local pharmacy or Health Dept.or vaccine clinic. Aware to provide a copy of the vaccination record if obtained from local pharmacy or Health Dept. Verbalized acceptance and understanding.  Qualifies for Shingles Vaccine? No   Zostavax completed No   Shingrix Completed?: No.    Education has been provided regarding the importance of this vaccine. Patient has been advised to call insurance company to determine out of pocket expense if they have not yet received this vaccine. Advised may also receive vaccine at local pharmacy or Health Dept. Verbalized acceptance and understanding.  Screening Tests Health Maintenance  Topic Date Due   COVID-19 Vaccine (1) Never done   Hepatitis C Screening  Never done   TETANUS/TDAP  Never done   PAP-Cervical Cytology Screening  02/15/2020   PAP SMEAR-Modifier  02/15/2020   INFLUENZA VACCINE  04/12/2022   HIV Screening  Completed   HPV VACCINES  Aged Out    Health Maintenance  Health Maintenance Due  Topic Date Due   COVID-19 Vaccine (1) Never done   Hepatitis C Screening  Never done   TETANUS/TDAP  Never done   PAP-Cervical Cytology Screening  02/15/2020   PAP  SMEAR-Modifier  02/15/2020      Mammogram status: No longer required due to  .    Lung Cancer Screening: (Low Dose CT Chest recommended if Age 4-80 years, 30 pack-year currently smoking OR have quit w/in 15years.) does not qualify.   Lung Cancer Screening Referral: NA  Additional Screening:  Hepatitis C Screening: does qualify  Vision Screening: Recommended annual ophthalmology exams for early detection of glaucoma and other disorders of the eye. Is the patient up to date with their annual eye exam?  Yes  Who is the provider or what is the name of the office in which the patient attends annual eye exams?  If pt is not established with a provider, would they like to be referred to a provider to establish care? No .   Dental Screening: Recommended annual dental exams for proper oral hygiene. Patient stated she has a dental appointment 02/21/2022 Community Resource Referral / Chronic Care Management: CRR required this visit?  No   CCM required this visit?  No      Plan:     I have personally reviewed and noted the following in the patient's chart:   Medical and social history Use of alcohol, tobacco or illicit drugs  Current medications and supplements including opioid prescriptions. Patient is not currently taking opioid prescriptions. Functional ability and status Nutritional status Physical activity Advanced directives List of other physicians Hospitalizations, surgeries, and ER visits in previous 12 months Vitals Screenings to include cognitive, depression, and falls Referrals and appointments  In addition, I have reviewed and discussed with patient certain preventive protocols, quality metrics, and best practice recommendations. A written personalized care plan for preventive services as well as general preventive health recommendations were provided to patient.     Schuyler Amor, [place]   02/21/2022   Nurse Notes: this was a face to face visit with patient. Pt  stated she has a eye doctor appointment on 02/23/2022

## 2022-02-21 NOTE — Progress Notes (Signed)
   New Patient   Subjective  [name] [name] is a 26 y.o. female who presents for the following: Skin Problem (X 3-4 years whole body pigment change noticed after she started plaquenil. No previous treatment per patient no family history of eczema or psoriasis  ).   The following portions of the chart were reviewed this encounter and updated as appropriate:  Tobacco  Allergies  Meds  Problems  Med Hx  Surg Hx  Fam Hx      Objective  Well appearing patient in no apparent distress; mood and affect are within normal limits.  A full examination was performed including scalp, head, eyes, ears, nose, lips, neck, chest, axillae, abdomen, back, buttocks, bilateral upper extremities, bilateral lower extremities, hands, feet, fingers, toes, fingernails, and toenails. All findings within normal limits unless otherwise noted below.  Head - Anterior (Face) Facial and full body dark round to oval lesions. They itch a little.           Assessment & Plan  Lupus ([place]) Head - Anterior (Face)  Possible skin lupus with history of lupus, 1 month follow up with possible biopsy. SPF 50 mechanical sunscreen daily   triamcinolone cream (KENALOG) 0.1 % - Head - Anterior (Face) Apply to AA daily and follow up in 30 days     I, Lanitra Battaglini, PA-C, have reviewed all documentation's for this visit.  The documentation on 02/21/22 for the exam, diagnosis, procedures and orders are all accurate and complete.

## 2022-03-22 ENCOUNTER — Ambulatory Visit (INDEPENDENT_AMBULATORY_CARE_PROVIDER_SITE_OTHER): Payer: 59 | Admitting: Physician Assistant

## 2022-03-22 ENCOUNTER — Encounter: Payer: Self-pay | Admitting: Physician Assistant

## 2022-03-22 DIAGNOSIS — D485 Neoplasm of uncertain behavior of skin: Secondary | ICD-10-CM | POA: Diagnosis not present

## 2022-03-22 NOTE — Patient Instructions (Signed)

## 2022-03-29 ENCOUNTER — Telehealth: Payer: Self-pay

## 2022-03-29 NOTE — Telephone Encounter (Signed)
Phone call to patient with her pathology results. Patient aware of results.  

## 2022-03-29 NOTE — Telephone Encounter (Signed)
-----   Message from Warren Danes, [place] sent at 03/28/2022  3:21 PM EDT ----- Continue TAC

## 2022-04-07 ENCOUNTER — Ambulatory Visit (INDEPENDENT_AMBULATORY_CARE_PROVIDER_SITE_OTHER): Payer: 59 | Admitting: Nurse Practitioner

## 2022-04-07 ENCOUNTER — Encounter: Payer: Self-pay | Admitting: Nurse Practitioner

## 2022-04-07 VITALS — BP 125/92 | HR 96 | Resp 18

## 2022-04-07 DIAGNOSIS — B379 Candidiasis, unspecified: Secondary | ICD-10-CM | POA: Diagnosis not present

## 2022-04-07 MED ORDER — FLUCONAZOLE 150 MG PO TABS: 150.0000 mg | ORAL_TABLET | Freq: Every day | ORAL | 0 refills | Status: DC

## 2022-04-07 NOTE — Patient Instructions (Addendum)
1. Yeast infection  - fluconazole (DIFLUCAN) 150 MG tablet; Take 1 tablet (150 mg total) by mouth daily. May repeat after 3 days if needed  Dispense: 2 tablet; Refill: 0  Follow up:  Follow up in 3 months or sooner if needed

## 2022-04-07 NOTE — Assessment & Plan Note (Signed)
-   fluconazole (DIFLUCAN) 150 MG tablet; Take 1 tablet (150 mg total) by mouth daily. May repeat after 3 days if needed  Dispense: 2 tablet; Refill: 0  Follow up:  Follow up in 3 months or sooner if needed

## 2022-04-07 NOTE — Progress Notes (Signed)
@Patient  ID: [name] [name], female    DOB: Sep 29, 1995, 26 y.o.   MRN: [id number]  Chief Complaint  Patient presents with   Follow-up    Concerned for yeast infection    Referring provider: Vevelyn Francois, NP   HPI  [name] [name] presents for follow up. She  has a past medical history of Accidental methotrexate overdose (01/08/2018), Acute lower UTI, Bronchitis (03/19/2019), Cardiomegaly (05/08/2019), Chronic midline low back pain without sciatica (11/09/2016), Dehydration (03/19/2019), Depo-Provera contraceptive status (08/11/2015), Depression (02/08/2017), Dysphagia (06/01/2018), Dysuria (04/19/2019), Elevated AST (SGOT) (03/19/2019), Essential hypertension (03/19/2019), Facial edema, Gout, Hypertension, IDA (iron deficiency anemia) (08/11/2015), Immunosuppressed status ([place]), Insomnia (01/29/2016), Leukopenia (01/10/2018), Lobar pneumonia ([place]) (06/07/2018), Lupus ([place]), Lupus (systemic lupus erythematosus) ([place]) (11/14/5972), Metabolic acidosis (09/18/3843), Methotrexate toxicity, Mouth ulcers, Mucositis oral (01/12/2018), Odynophagia (06/01/2018), Oral thrush (03/19/2019), Pleuritic chest pain (06/05/2018), Primary osteoarthritis of both knees (11/09/2016), Rash due to systemic lupus erythematosus (SLE) ([place]), Raynaud disease, Raynaud's disease (08/11/2015), Relative acute adrenal insufficiency ([place]) (06/11/2018), Right leg DVT ([place]) (05/08/2019), Seasonal allergies (08/11/2015), Sepsis ([place]) (05/08/2019), Sinus tachycardia (03/19/2019), SLE (systemic lupus erythematosus) ([place]) (09/19/2016), Thrush (06/01/2018), Vaginal candidiasis (01/12/2018), and Vitamin D deficiency (11/09/2016).   Patient presents today for a follow up visit. She has been doing well.  Patient does follow-up with rheumatology and has not follow-up lab work in August.  Patient complains today of yeast medication that started after going to the beach last week.  She states that she did have to use different soaps while there.  We will order Diflucan. Denies f/c/s,  n/v/d, hemoptysis, PND, leg swelling Denies chest pain or edema      Allergies  Allergen Reactions   Lisinopril Other (See Comments)     Angioedema    Latex Rash    Immunization History  Administered Date(s) Administered   Influenza,inj,Quad PF,6+ Mos 08/11/2015, 07/27/2018, 05/17/2019, 07/08/2021   Pneumococcal Conjugate-13 05/13/2016, 05/28/2021   Pneumococcal Polysaccharide-23 05/29/2019    Past Medical History:  Diagnosis Date   Accidental methotrexate overdose 01/08/2018   Acute lower UTI    Asthma    Bronchitis 03/19/2019   Cardiomegaly 05/08/2019   Chronic midline low back pain without sciatica 11/09/2016   Facet joint arthropathy   Dehydration 03/19/2019   Depo-Provera contraceptive status 08/11/2015   Depression 02/08/2017   Dysphagia 06/01/2018   Dysuria 04/19/2019   Elevated AST (SGOT) 03/19/2019   Essential hypertension 03/19/2019   Facial edema    Gout    Hypertension    IDA (iron deficiency anemia) 08/11/2015   Immunosuppressed status ([place])    Insomnia 01/29/2016   Leukopenia 01/10/2018   Lobar pneumonia ([place]) 06/07/2018   Lupus ([place])    Lupus (systemic lupus erythematosus) ([place]) 36/46/8032   Metabolic acidosis 09/04/8249   Methotrexate toxicity    Mouth ulcers    Mucositis oral 01/12/2018   Odynophagia 06/01/2018   Oral thrush 03/19/2019   Pleuritic chest pain 06/05/2018   Primary osteoarthritis of both knees 11/09/2016   Rash due to systemic lupus erythematosus (SLE) ([place])    Raynaud disease    Raynaud's disease 08/11/2015   Relative acute adrenal insufficiency ([place]) 06/11/2018   Right leg DVT ([place]) 05/08/2019   Seasonal allergies 08/11/2015   Sepsis ([place]) 05/08/2019   Sinus tachycardia 03/19/2019   SLE (systemic lupus erythematosus) ([place]) 09/19/2016     Positive ANA, double-stranded DNA, Ro, RNP, RF, history of nasal ulcers, fatigue, anemia, arthritis, Raynauds   Thrush 06/01/2018   Vaginal candidiasis 01/12/2018   Vitamin  D  deficiency 11/09/2016    Tobacco History: Social History   Tobacco Use  Smoking Status Never  Smokeless Tobacco Never   Counseling given: Not Answered   Outpatient Encounter Medications as of 04/07/2022  Medication Sig   fluconazole (DIFLUCAN) 150 MG tablet Take 1 tablet (150 mg total) by mouth daily. May repeat after 3 days if needed   Blood Pressure Monitor KIT 1 kit by Does not apply route daily.   DULoxetine (CYMBALTA) 30 MG capsule Take 1 capsule (30 mg total) by mouth 2 (two) times daily.   etonogestrel (NEXPLANON) 68 MG IMPL implant 68 mg by Subdermal route continuous.   famotidine (PEPCID) 20 MG tablet Take 20 mg by mouth daily.   fluconazole (DIFLUCAN) 200 MG tablet Take 200 mg by mouth.   folic acid (FOLVITE) 1 MG tablet Take 1 tablet (1 mg total) by mouth at bedtime. (Patient taking differently: Take 1 mg by mouth daily.)   hydroxychloroquine (PLAQUENIL) 200 MG tablet Take 100-200 mg by mouth See admin instructions. Takes 1 tablet in the morning and 1/2 tablet at night.   losartan (COZAAR) 25 MG tablet Take 25 mg by mouth daily. Take once daily at  bedtime   magnesium (MAGTAB) 84 MG (7MEQ) TBCR SR tablet Take 84 mg by mouth daily.   mirtazapine (REMERON) 15 MG tablet Take 1 tablet (15 mg total) by mouth daily.   mupirocin ointment (BACTROBAN) 2 % Apply 1 application. topically daily.   mycophenolate (CELLCEPT) 500 MG tablet Take 1,500 mg by mouth 2 (two) times daily.   PERIDEX 0.12 % solution 15 mLs 2 (two) times daily.   Suvorexant (BELSOMRA) 5 MG TABS Take 5 mg by mouth at bedtime as needed.   tacrolimus (PROGRAF) 1 MG capsule Take by mouth.   triamcinolone cream (KENALOG) 0.1 % Apply to AA daily and follow up in 30 days   Vitamin D, Ergocalciferol, (DRISDOL) 50000 units CAPS capsule Take 1 capsule (50,000 Units total) by mouth 2 (two) times a week. (Patient taking differently: Take 50,000 Units by mouth 2 (two) times a week. Monday & Wednesday)   No  facility-administered encounter medications on file as of 04/07/2022.     Review of Systems  Review of Systems  Constitutional: Negative.   HENT: Negative.    Cardiovascular: Negative.   Gastrointestinal: Negative.   Genitourinary:        Yeast infection  Allergic/Immunologic: Negative.   Neurological: Negative.   Psychiatric/Behavioral: Negative.         Physical Exam  BP (!) 125/92   Pulse 96   Resp 18   Wt Readings from Last 5 Encounters:  02/21/22 174 lb 6.4 oz (79.1 kg)  02/09/22 172 lb 1.9 oz (78.1 kg)  01/06/22 174 lb 6 oz (79.1 kg)  01/05/22 174 lb (78.9 kg)  07/08/21 159 lb 0.4 oz (72.1 kg)     Physical Exam Vitals and nursing note reviewed.  Constitutional:      General: She is not in acute distress.    Appearance: She is well-developed.  Cardiovascular:     Rate and Rhythm: Normal rate and regular rhythm.  Pulmonary:     Effort: Pulmonary effort is normal.     Breath sounds: Normal breath sounds.  Neurological:     Mental Status: She is alert and oriented to person, place, and time.  Psychiatric:        Mood and Affect: Mood normal.        Behavior: Behavior normal.  Lab Results:  CBC    Component Value Date/Time   WBC 5.3 02/09/2022 1141   WBC 8.0 01/04/2022 1928   RBC 4.11 02/09/2022 1141   RBC 4.07 02/09/2022 1141   HGB 11.1 (L) 02/09/2022 1141   HGB 9.2 (L) 12/03/2019 1449   HCT 34.7 (L) 02/09/2022 1141   HCT 28.6 (L) 12/03/2019 1449   PLT 344 02/09/2022 1141   PLT 174 12/03/2019 1449   MCV 84.4 02/09/2022 1141   MCV 81 12/03/2019 1449   MCH 27.0 02/09/2022 1141   MCHC 32.0 02/09/2022 1141   RDW 15.1 02/09/2022 1141   RDW 16.2 (H) 12/03/2019 1449   LYMPHSABS 1.7 02/09/2022 1141   LYMPHSABS 0.5 (L) 03/28/2018 1042   MONOABS 0.3 02/09/2022 1141   EOSABS 0.1 02/09/2022 1141   EOSABS 0.0 03/28/2018 1042   BASOSABS 0.0 02/09/2022 1141   BASOSABS 0.0 03/28/2018 1042    BMET    Component Value Date/Time   NA 140  02/09/2022 1141   NA 140 12/03/2019 1449   K 4.6 02/09/2022 1141   CL 108 02/09/2022 1141   CO2 26 02/09/2022 1141   GLUCOSE 86 02/09/2022 1141   BUN 18 02/09/2022 1141   BUN 11 12/03/2019 1449   CREATININE 1.25 (H) 02/09/2022 1141   CREATININE 0.57 12/11/2017 1018   CALCIUM 9.0 02/09/2022 1141   GFRNONAA >60 02/09/2022 1141   GFRNONAA 131 12/11/2017 1018   GFRAA >60 05/06/2020 1407   GFRAA 151 12/11/2017 1018    BNP    Component Value Date/Time   BNP 32.9 02/12/2021 1224      Assessment & Plan:   Yeast infection - fluconazole (DIFLUCAN) 150 MG tablet; Take 1 tablet (150 mg total) by mouth daily. May repeat after 3 days if needed  Dispense: 2 tablet; Refill: 0  Follow up:  Follow up in 3 months or sooner if needed     Fenton Foy, NP 04/07/2022

## 2022-04-13 ENCOUNTER — Encounter: Payer: Self-pay | Admitting: Physician Assistant

## 2022-04-13 NOTE — Progress Notes (Signed)
   Follow-Up Visit   Subjective  [name] [name] is a 26 y.o. female who presents for the following: Follow-up (Possible biopsy today, no real improvement with treatment so far full body possible lupus ).   The following portions of the chart were reviewed this encounter and updated as appropriate:  Tobacco  Allergies  Meds  Problems  Med Hx  Surg Hx  Fam Hx      Objective  Well appearing patient in no apparent distress; mood and affect are within normal limits.  A full examination was performed including scalp, head, eyes, ears, nose, lips, neck, chest, axillae, abdomen, back, buttocks, bilateral upper extremities, bilateral lower extremities, hands, feet, fingers, toes, fingernails, and toenails. All findings within normal limits unless otherwise noted below.  Left Upper Arm - Posterior/ mid back Hyperpigmented macules     Left upper arm-posterior / mid back Hyperpigmented macules      Assessment & Plan  Neoplasm of uncertain behavior of skin (2) Left Upper Arm - Posterior/ mid back  Skin / nail biopsy Type of biopsy: tangential   Informed consent: discussed and consent obtained   Timeout: patient name, date of birth, surgical site, and procedure verified   Anesthesia: the lesion was anesthetized in a standard fashion   Anesthetic:  1% lidocaine w/ epinephrine 1-100,000 local infiltration Instrument used: flexible razor blade   Hemostasis achieved with: ferric subsulfate   Outcome: patient tolerated procedure well   Post-procedure details: wound care instructions given   Additional details:  Rule out cutaneous lupus.  Specimen 1 - Surgical pathology Differential Diagnosis: CLE  Check Margins: No  Left upper arm-posterior / mid back  Skin / nail biopsy Type of biopsy: tangential   Informed consent: discussed and consent obtained   Timeout: patient name, date of birth, surgical site, and procedure verified   Anesthesia: the lesion was anesthetized in a  standard fashion   Anesthetic:  1% lidocaine w/ epinephrine 1-100,000 local infiltration Instrument used: flexible razor blade   Hemostasis achieved with: ferric subsulfate   Outcome: patient tolerated procedure well   Post-procedure details: wound care instructions given    Specimen 2 - Surgical pathology Differential Diagnosis: CLE Michelle's solution Check Margins: No    I, Farron Lafond, PA-C, have reviewed all documentation's for this visit.  The documentation on 04/13/22 for the exam, diagnosis, procedures and orders are all accurate and complete.

## 2022-04-26 ENCOUNTER — Emergency Department (HOSPITAL_COMMUNITY)
Admission: EM | Admit: 2022-04-26 | Discharge: 2022-04-26 | Disposition: A | Discharge status: Home / Self Care | Payer: 59 | Attending: Emergency Medicine | Admitting: Emergency Medicine

## 2022-04-26 ENCOUNTER — Other Ambulatory Visit: Payer: Self-pay

## 2022-04-26 ENCOUNTER — Encounter (HOSPITAL_COMMUNITY): Payer: Self-pay

## 2022-04-26 DIAGNOSIS — Z91040 Latex allergy status: Secondary | ICD-10-CM | POA: Diagnosis not present

## 2022-04-26 DIAGNOSIS — W57XXXA Bitten or stung by nonvenomous insect and other nonvenomous arthropods, initial encounter: Secondary | ICD-10-CM | POA: Diagnosis not present

## 2022-04-26 DIAGNOSIS — S30860A Insect bite (nonvenomous) of lower back and pelvis, initial encounter: Secondary | ICD-10-CM | POA: Diagnosis present

## 2022-04-26 MED ORDER — CEPHALEXIN 500 MG PO CAPS: 500.0000 mg | ORAL_CAPSULE | Freq: Four times a day (QID) | ORAL | 0 refills | Status: AC

## 2022-04-26 MED ORDER — BACITRACIN ZINC 500 UNIT/GM EX OINT
TOPICAL_OINTMENT | Freq: Two times a day (BID) | CUTANEOUS | Status: DC
Start: 2022-04-26 — End: 2022-04-26

## 2022-04-26 MED ORDER — BACITRACIN ZINC 500 UNIT/GM EX OINT: 1.0000 | TOPICAL_OINTMENT | Freq: Two times a day (BID) | CUTANEOUS | 0 refills | Status: DC

## 2022-04-26 NOTE — Discharge Instructions (Signed)
Apply bacitracin to area as prescribed. Apply warm compress to area for 20 minutes at a time.  Do NOT try to squeeze or drain area.  Start Keflex for any worsening or symptoms.  Recheck with your doctor in 2 days or recheck with student health. Return for fevers or other concerning symptoms.

## 2022-04-26 NOTE — ED Provider Notes (Signed)
[facility] DEPT Provider Note   CSN: [id number] Arrival date & time: 04/26/22  0944     History  Chief Complaint  Patient presents with   Insect Bite    [name] [name] is a 26 y.o. female.  26 year old female with concern for insect bite to her right buttocks.  First noticed area 3 days ago.  Patient states that she has SLE, has a weak immune system and does not want her infection to get worse.  Reports prior boil to her left buttock earlier.       Home Medications Prior to Admission medications   Medication Sig Start Date End Date Taking? Authorizing Provider  bacitracin ointment Apply 1 Application topically 2 (two) times daily. 04/26/22  Yes Tacy Learn, PA-C  cephALEXin (KEFLEX) 500 MG capsule Take 1 capsule (500 mg total) by mouth 4 (four) times daily for 7 days. 04/26/22 05/03/22 Yes Tacy Learn, PA-C  Blood Pressure Monitor KIT 1 kit by Does not apply route daily. 07/08/21 07/08/22  Vevelyn Francois, NP  DULoxetine (CYMBALTA) 30 MG capsule Take 1 capsule (30 mg total) by mouth 2 (two) times daily. 09/16/21   Salley Slaughter, NP  etonogestrel (NEXPLANON) 68 MG IMPL implant 68 mg by Subdermal route continuous. 06/25/20 06/13/23  [provider]  famotidine (PEPCID) 20 MG tablet Take 20 mg by mouth daily.    [provider]  fluconazole (DIFLUCAN) 150 MG tablet Take 1 tablet (150 mg total) by mouth daily. May repeat after 3 days if needed 04/07/22   Fenton Foy, NP  fluconazole (DIFLUCAN) 200 MG tablet Take 200 mg by mouth. 12/16/21   [provider]  folic acid (FOLVITE) 1 MG tablet Take 1 tablet (1 mg total) by mouth at bedtime. Patient taking differently: Take 1 mg by mouth daily. 03/25/19   Kayleen Memos, DO  hydroxychloroquine (PLAQUENIL) 200 MG tablet Take 100-200 mg by mouth See admin instructions. Takes 1 tablet in the morning and 1/2 tablet at night.    [provider]  losartan (COZAAR) 25 MG  tablet Take 25 mg by mouth daily. Take once daily at  bedtime    [provider]  magnesium (MAGTAB) 84 MG (7MEQ) TBCR SR tablet Take 84 mg by mouth daily. 11/23/21   [provider]  mirtazapine (REMERON) 15 MG tablet Take 1 tablet (15 mg total) by mouth daily. 02/17/22 05/18/22  Fenton Foy, NP  mupirocin ointment (BACTROBAN) 2 % Apply 1 application. topically daily. 01/06/22   Raspet, Derry Skill, PA-C  mycophenolate (CELLCEPT) 500 MG tablet Take 1,500 mg by mouth 2 (two) times daily. 01/22/21   [provider]  PERIDEX 0.12 % solution 15 mLs 2 (two) times daily. 11/10/21   [provider]  Suvorexant (BELSOMRA) 5 MG TABS Take 5 mg by mouth at bedtime as needed. 09/16/21   Salley Slaughter, NP  tacrolimus (PROGRAF) 1 MG capsule Take by mouth. 07/02/21   [provider]  triamcinolone cream (KENALOG) 0.1 % Apply to AA daily and follow up in 30 days 02/16/22   Warren Danes, PA-C  Vitamin D, Ergocalciferol, (DRISDOL) 50000 units CAPS capsule Take 1 capsule (50,000 Units total) by mouth 2 (two) times a week. Patient taking differently: Take 50,000 Units by mouth 2 (two) times a week. Monday & Wednesday 12/21/17   Bo Merino, MD      Allergies    Lisinopril and Latex    Review of  Systems   Review of Systems Negative except as per HPI Physical Exam Updated Vital Signs BP (!) 128/93 (BP Location: Right Arm)   Pulse 87   Temp 98.6 F (37 C)   Resp 16   SpO2 99%  Physical Exam Vitals and nursing note reviewed.  Constitutional:      General: She is not in acute distress.    Appearance: She is well-developed. She is not diaphoretic.  HENT:     Head: Normocephalic and atraumatic.  Pulmonary:     Effort: Pulmonary effort is normal.  Musculoskeletal:        General: No tenderness.  Skin:    General: Skin is warm and dry.     Findings: Rash present. No erythema.     Comments: Round raised area to right buttock approximately 3 cm x 3 cm.   Central area possibly consistent with an insect bite.  There is no erythema, no induration, no fluctuance.  Neurological:     Mental Status: She is alert and oriented to person, place, and time.  Psychiatric:        Behavior: Behavior normal.     ED Results / Procedures / Treatments   Labs (all labs ordered are listed, but only abnormal results are displayed) Labs Reviewed - No data to display  EKG None  Radiology No results found.  Procedures Procedures    Medications Ordered in ED Medications - No data to display   ED Course/ Medical Decision Making/ A&P                           Medical Decision Making  26 year old female with possible insect bite to left buttocks which she first noticed 3 days ago.  Denies itching or pain in the area.  Patient is concerned that her medications for her SLE give her a weak immune system and could progress to severe infection.  Offered reassurance, at this time there does not appear to be an infection so much is a local reaction in the area.  Recommend starting with bacitracin ointment to the area And given Keflex with recommendation to take if she notices any warmth, firmness or any progressive symptoms with strict return to ER precautions and follow-up with PCP in 2 days.  Patient plans to start her oral antibiotics today.  Discussed why this is not the current recommendation (appears to be local reaction to possible bite and not infected at this time).        Final Clinical Impression(s) / ED Diagnoses Final diagnoses:  Insect bite, unspecified site, initial encounter    Rx / DC Orders ED Discharge Orders          Ordered    cephALEXin (KEFLEX) 500 MG capsule  4 times daily        04/26/22 1049    bacitracin ointment  2 times daily        04/26/22 1054              Roque Lias 04/26/22 1054    Wyvonnia Dusky, MD 04/26/22 1112

## 2022-04-26 NOTE — ED Triage Notes (Signed)
Pt reports being bit on her right buttock by a spider x3 days ago. Pt endorses redness, swelling, and pain.

## 2022-05-12 ENCOUNTER — Other Ambulatory Visit: Payer: Self-pay | Admitting: Rheumatology

## 2022-05-19 NOTE — Telephone Encounter (Signed)
Please advise on potential referral to allergy for urticaria. Thanks!

## 2022-05-20 ENCOUNTER — Other Ambulatory Visit: Payer: Self-pay | Admitting: Nurse Practitioner

## 2022-05-20 DIAGNOSIS — L509 Urticaria, unspecified: Secondary | ICD-10-CM

## 2022-05-20 NOTE — Telephone Encounter (Signed)
Referral placed. May take zyrtec OTC daily and pepcid OTC twice daily to help with this. Thanks.

## 2022-05-23 ENCOUNTER — Inpatient Hospital Stay (HOSPITAL_BASED_OUTPATIENT_CLINIC_OR_DEPARTMENT_OTHER): Payer: 59 | Admitting: Family

## 2022-05-23 ENCOUNTER — Encounter: Payer: Self-pay | Admitting: Family

## 2022-05-23 ENCOUNTER — Inpatient Hospital Stay: Payer: 59 | Attending: Hematology & Oncology

## 2022-05-23 VITALS — BP 138/96 | HR 71 | Temp 98.6°F | Resp 17 | Wt 171.8 lb

## 2022-05-23 DIAGNOSIS — E611 Iron deficiency: Secondary | ICD-10-CM | POA: Diagnosis not present

## 2022-05-23 DIAGNOSIS — D500 Iron deficiency anemia secondary to blood loss (chronic): Secondary | ICD-10-CM | POA: Diagnosis not present

## 2022-05-23 DIAGNOSIS — D6862 Lupus anticoagulant syndrome: Secondary | ICD-10-CM | POA: Diagnosis present

## 2022-05-23 DIAGNOSIS — I2699 Other pulmonary embolism without acute cor pulmonale: Secondary | ICD-10-CM

## 2022-05-23 DIAGNOSIS — I7300 Raynaud's syndrome without gangrene: Secondary | ICD-10-CM | POA: Insufficient documentation

## 2022-05-23 DIAGNOSIS — Z7901 Long term (current) use of anticoagulants: Secondary | ICD-10-CM | POA: Insufficient documentation

## 2022-05-23 DIAGNOSIS — Z86718 Personal history of other venous thrombosis and embolism: Secondary | ICD-10-CM | POA: Diagnosis not present

## 2022-05-23 DIAGNOSIS — R760 Raised antibody titer: Secondary | ICD-10-CM

## 2022-05-23 DIAGNOSIS — I82411 Acute embolism and thrombosis of right femoral vein: Secondary | ICD-10-CM

## 2022-05-23 LAB — CBC WITH DIFFERENTIAL (CANCER CENTER ONLY)
Abs Immature Granulocytes: 0.01 10*3/uL (ref 0.00–0.07)
Basophils Absolute: 0 10*3/uL (ref 0.0–0.1)
Basophils Relative: 0 %
Eosinophils Absolute: 0.4 10*3/uL (ref 0.0–0.5)
Eosinophils Relative: 6 %
HCT: 30.2 % — ABNORMAL LOW (ref 36.0–46.0)
Hemoglobin: 9.8 g/dL — ABNORMAL LOW (ref 12.0–15.0)
Immature Granulocytes: 0 %
Lymphocytes Relative: 31 %
Lymphs Abs: 2.1 10*3/uL (ref 0.7–4.0)
MCH: 27.2 pg (ref 26.0–34.0)
MCHC: 32.5 g/dL (ref 30.0–36.0)
MCV: 83.9 fL (ref 80.0–100.0)
Monocytes Absolute: 0.3 10*3/uL (ref 0.1–1.0)
Monocytes Relative: 4 %
Neutro Abs: 4 10*3/uL (ref 1.7–7.7)
Neutrophils Relative %: 59 %
Platelet Count: 356 10*3/uL (ref 150–400)
RBC: 3.6 MIL/uL — ABNORMAL LOW (ref 3.87–5.11)
RDW: 13.4 % (ref 11.5–15.5)
WBC Count: 6.7 10*3/uL (ref 4.0–10.5)
nRBC: 0 % (ref 0.0–0.2)

## 2022-05-23 LAB — CMP (CANCER CENTER ONLY)
ALT: 7 U/L (ref 0–44)
AST: 14 U/L — ABNORMAL LOW (ref 15–41)
Albumin: 3.1 g/dL — ABNORMAL LOW (ref 3.5–5.0)
Alkaline Phosphatase: 71 U/L (ref 38–126)
Anion gap: 6 (ref 5–15)
BUN: 15 mg/dL (ref 6–20)
CO2: 25 mmol/L (ref 22–32)
Calcium: 8.8 mg/dL — ABNORMAL LOW (ref 8.9–10.3)
Chloride: 107 mmol/L (ref 98–111)
Creatinine: 1.39 mg/dL — ABNORMAL HIGH (ref 0.44–1.00)
GFR, Estimated: 53 mL/min — ABNORMAL LOW (ref >60)
Glucose, Bld: 72 mg/dL (ref 70–99)
Potassium: 3.9 mmol/L (ref 3.5–5.1)
Sodium: 138 mmol/L (ref 135–145)
Total Bilirubin: 0.2 mg/dL — ABNORMAL LOW (ref 0.3–1.2)
Total Protein: 8.3 g/dL — ABNORMAL HIGH (ref 6.5–8.1)

## 2022-05-23 LAB — RETICULOCYTES
Immature Retic Fract: 4.9 % (ref 2.3–15.9)
RBC.: 3.6 MIL/uL — ABNORMAL LOW (ref 3.87–5.11)
Retic Count, Absolute: 60.8 10*3/uL (ref 19.0–186.0)
Retic Ct Pct: 1.7 % (ref 0.4–3.1)

## 2022-05-23 LAB — FERRITIN: Ferritin: 159 ng/mL (ref 11–307)

## 2022-05-23 MED ORDER — RIVAROXABAN 10 MG PO TABS: 10.0000 mg | ORAL_TABLET | Freq: Every day | ORAL | 6 refills | Status: DC

## 2022-05-23 MED ORDER — RIVAROXABAN 20 MG PO TABS: 20.0000 mg | ORAL_TABLET | Freq: Every day | ORAL | 6 refills | Status: DC

## 2022-05-23 NOTE — Progress Notes (Signed)
Hematology and Oncology Follow Up Visit  [name] [name] [id number] [birth year] 26 y.o. 05/23/2022   Principle Diagnosis:  DVT of the right lower extremity by [place] and VQ scan indeterminate of PE in August/September 2020 Lupus anticoagulant positive --transient positivity. Iron deficiency   Current Therapy:        IV iron --Feraheme given on 01/19/2022 Xarelto 10 mg PO Daily   Interim History:  Ms. Ozbun is here today for follow-up. She is feeling fatigued.  She had been off of Xarelto for a while but decided to restart her Xarelto earlier this month. Her cycle then started on September 2nd and is still on with heavy flow. She also has had some bruising on the buttocks and bleeding around her gums when she brushes her teeth.  No petechiae.  No fever, chills, n/v, cough, rash, dizziness, SOB, chest pain, palpitations, abdominal pain or changes in bowel or bladder habits.  No swelling in her extremities at this time.  She notes numbness and tingling in her hands and feet with Raynaud's.  No falls or syncope reported.  Appetite and hydration are good. Her weight is stable at 171 lbs.   ECOG Performance Status: 1 - Symptomatic but completely ambulatory  Medications:  Allergies as of 05/23/2022       Reactions   Lisinopril Other (See Comments)    Angioedema    Latex Rash        Medication List        Accurate as of May 23, 2022  2:51 PM. If you have any questions, ask your nurse or doctor.          bacitracin ointment Apply 1 Application topically 2 (two) times daily.   Belsomra 5 MG Tabs Generic drug: Suvorexant Take 5 mg by mouth at bedtime as needed.   Blood Pressure Monitor Kit 1 kit by Does not apply route daily.   DULoxetine 30 MG capsule Commonly known as: Cymbalta Take 1 capsule (30 mg total) by mouth 2 (two) times daily.   famotidine 20 MG tablet Commonly known as: PEPCID Take 20 mg by mouth daily.   fluconazole 200 MG tablet Commonly known as:  DIFLUCAN Take 200 mg by mouth.   fluconazole 150 MG tablet Commonly known as: Diflucan Take 1 tablet (150 mg total) by mouth daily. May repeat after 3 days if needed   folic acid 1 MG tablet Commonly known as: FOLVITE Take 1 tablet (1 mg total) by mouth at bedtime. What changed: when to take this   hydroxychloroquine 200 MG tablet Commonly known as: PLAQUENIL Take 100-200 mg by mouth See admin instructions. Takes 1 tablet in the morning and 1/2 tablet at night.   losartan 25 MG tablet Commonly known as: COZAAR Take 25 mg by mouth daily. Take once daily at  bedtime   magnesium 84 MG (7MEQ) Tbcr SR tablet Commonly known as: MAGTAB Take 84 mg by mouth daily.   mirtazapine 15 MG tablet Commonly known as: REMERON Take 1 tablet (15 mg total) by mouth daily.   mupirocin ointment 2 % Commonly known as: BACTROBAN Apply 1 application. topically daily.   mycophenolate 500 MG tablet Commonly known as: CELLCEPT Take 1,500 mg by mouth 2 (two) times daily.   Nexplanon 68 MG Impl implant Generic drug: etonogestrel 68 mg by Subdermal route continuous.   Peridex 0.12 % solution Generic drug: chlorhexidine 15 mLs 2 (two) times daily.   rivaroxaban 20 MG Tabs tablet Commonly known as: Xarelto Take 1 tablet (  20 mg total) by mouth daily with supper.   tacrolimus 1 MG capsule Commonly known as: PROGRAF Take by mouth.   triamcinolone cream 0.1 % Commonly known as: KENALOG Apply to AA daily and follow up in 30 days   Vitamin D (Ergocalciferol) 1.25 MG (50000 UNIT) Caps capsule Commonly known as: DRISDOL Take 1 capsule (50,000 Units total) by mouth 2 (two) times a week. What changed: additional instructions        Allergies:  Allergies  Allergen Reactions   Lisinopril Other (See Comments)     Angioedema    Latex Rash    Past Medical History, Surgical history, Social history, and Family History were reviewed and updated.  Review of Systems: All other 10 point review  of systems is negative.   Physical Exam:  weight is 171 lb 12.8 oz (77.9 kg). Her oral temperature is 98.6 F (37 C). Her blood pressure is 138/96 (abnormal) and her pulse is 71. Her respiration is 17 and oxygen saturation is 93%.   Wt Readings from Last 3 Encounters:  05/23/22 171 lb 12.8 oz (77.9 kg)  02/21/22 174 lb 6.4 oz (79.1 kg)  02/09/22 172 lb 1.9 oz (78.1 kg)    Ocular: Sclerae unicteric, pupils equal, round and reactive to light Ear-nose-throat: Oropharynx clear, dentition fair Lymphatic: No cervical or supraclavicular adenopathy Lungs no rales or rhonchi, good excursion bilaterally Heart regular rate and rhythm, no murmur appreciated Abd soft, nontender, positive bowel sounds MSK no focal spinal tenderness, no joint edema Neuro: non-focal, well-oriented, appropriate affect Breasts: Deferred   Lab Results  Component Value Date   WBC 6.7 05/23/2022   HGB 9.8 (L) 05/23/2022   HCT 30.2 (L) 05/23/2022   MCV 83.9 05/23/2022   PLT 356 05/23/2022   Lab Results  Component Value Date   FERRITIN 365 (H) 02/09/2022   IRON 54 02/09/2022   TIBC 245 (L) 02/09/2022   UIBC 191 02/09/2022   IRONPCTSAT 22 02/09/2022   Lab Results  Component Value Date   RETICCTPCT 1.7 05/23/2022   RBC 3.60 (L) 05/23/2022   RBC 3.60 (L) 05/23/2022   No results found for: "KPAFRELGTCHN", "LAMBDASER", "KAPLAMBRATIO" No results found for: "IGGSERUM", "IGA", "IGMSERUM" No results found for: "TOTALPROTELP", "ALBUMINELP", "A1GS", "A2GS", "BETS", "BETA2SER", "GAMS", "MSPIKE", "SPEI"   Chemistry      Component Value Date/Time   NA 138 05/23/2022 1341   NA 140 12/03/2019 1449   K 3.9 05/23/2022 1341   CL 107 05/23/2022 1341   CO2 25 05/23/2022 1341   BUN 15 05/23/2022 1341   BUN 11 12/03/2019 1449   CREATININE 1.39 (H) 05/23/2022 1341   CREATININE 0.57 12/11/2017 1018      Component Value Date/Time   CALCIUM 8.8 (L) 05/23/2022 1341   ALKPHOS 71 05/23/2022 1341   AST 14 (L) 05/23/2022  1341   ALT 7 05/23/2022 1341   BILITOT 0.2 (L) 05/23/2022 1341       Impression and Plan: [name] is a very pleasant 26 yo African American female with history of systemic lupus erythematosus and Raynaud's. She was diagnosed with right lower extremity DVT back in August 2020.  We had her on Xarelto and she had previously stopped this herself and started taking aspirin. She has since stopped the aspirin and restarted Xarelto 20 mg PO daily.  I spoke with Dr. Marin Olp and we will reduce her dose to 10 mg PO daily.  Iron studies are pending. We will replace if needed.  Follow-up in 3  months.   Lottie Dawson, NP 9/11/20232:51 PM

## 2022-05-24 LAB — IRON AND IRON BINDING CAPACITY (CC-WL,HP ONLY)
Iron: 46 ug/dL (ref 28–170)
Iron: 49 ug/dL (ref 28–170)
Saturation Ratios: 22 % (ref 10.4–31.8)
Saturation Ratios: 24 % (ref 10.4–31.8)
TIBC: 209 ug/dL — ABNORMAL LOW (ref 250–450)
TIBC: 210 ug/dL — ABNORMAL LOW (ref 250–450)
UIBC: 160 ug/dL (ref 148–442)
UIBC: 164 ug/dL (ref 148–442)

## 2022-05-24 LAB — LUPUS ANTICOAGULANT PANEL
DRVVT: 80.1 s — ABNORMAL HIGH (ref 0.0–47.0)
PTT Lupus Anticoagulant: 44.8 s — ABNORMAL HIGH (ref 0.0–43.5)

## 2022-05-24 LAB — PTT-LA MIX: PTT-LA Mix: 40.9 s — ABNORMAL HIGH (ref 0.0–40.5)

## 2022-05-24 LAB — DRVVT CONFIRM: dRVVT Confirm: 1.5 ratio — ABNORMAL HIGH (ref 0.8–1.2)

## 2022-05-24 LAB — HEXAGONAL PHASE PHOSPHOLIPID: Hexagonal Phase Phospholipid: 5 s (ref 0–11)

## 2022-05-24 LAB — DRVVT MIX: dRVVT Mix: 60.7 s — ABNORMAL HIGH (ref 0.0–40.4)

## 2022-05-26 ENCOUNTER — Telehealth: Payer: Self-pay | Admitting: *Deleted

## 2022-05-26 NOTE — Telephone Encounter (Signed)
Per 05/23/22 los -  No follow-up needed.

## 2022-08-12 ENCOUNTER — Other Ambulatory Visit (HOSPITAL_COMMUNITY): Payer: Self-pay

## 2022-08-12 ENCOUNTER — Other Ambulatory Visit: Payer: Self-pay | Admitting: Nurse Practitioner

## 2022-08-12 ENCOUNTER — Encounter: Payer: Self-pay | Admitting: Hematology & Oncology

## 2022-08-12 MED ORDER — MIRTAZAPINE 15 MG PO TABS
15.0000 mg | ORAL_TABLET | Freq: Every day | ORAL | 2 refills | Status: DC
  Filled 2022-08-12: qty 30, 30d supply, fill #0

## 2022-08-12 NOTE — Telephone Encounter (Signed)
From: [name] To: Office of Fenton Foy, NP Sent: 08/11/2022 5:28 PM EST Subject: Medication Renewal Request  Refills have been requested for the following medications:   mirtazapine (REMERON) 15 MG tablet [name] [name] Chandler Swiderski]  Preferred pharmacy: [facility] Delivery method: Mail

## 2022-08-19 ENCOUNTER — Other Ambulatory Visit: Payer: Self-pay

## 2022-08-19 ENCOUNTER — Encounter (HOSPITAL_COMMUNITY): Payer: Self-pay

## 2022-08-19 ENCOUNTER — Emergency Department (HOSPITAL_COMMUNITY)
Admission: EM | Admit: 2022-08-19 | Discharge: 2022-08-19 | Disposition: A | Discharge status: Home / Self Care | Payer: 59 | Attending: Emergency Medicine | Admitting: Emergency Medicine

## 2022-08-19 ENCOUNTER — Ambulatory Visit (HOSPITAL_BASED_OUTPATIENT_CLINIC_OR_DEPARTMENT_OTHER): Payer: 59

## 2022-08-19 DIAGNOSIS — Z7901 Long term (current) use of anticoagulants: Secondary | ICD-10-CM | POA: Diagnosis not present

## 2022-08-19 DIAGNOSIS — Z86718 Personal history of other venous thrombosis and embolism: Secondary | ICD-10-CM | POA: Insufficient documentation

## 2022-08-19 DIAGNOSIS — M7989 Other specified soft tissue disorders: Secondary | ICD-10-CM | POA: Insufficient documentation

## 2022-08-19 DIAGNOSIS — Z79899 Other long term (current) drug therapy: Secondary | ICD-10-CM | POA: Insufficient documentation

## 2022-08-19 DIAGNOSIS — R52 Pain, unspecified: Secondary | ICD-10-CM | POA: Diagnosis not present

## 2022-08-19 DIAGNOSIS — Z91040 Latex allergy status: Secondary | ICD-10-CM | POA: Insufficient documentation

## 2022-08-19 DIAGNOSIS — M79604 Pain in right leg: Secondary | ICD-10-CM | POA: Diagnosis present

## 2022-08-19 MED ORDER — MUPIROCIN 2 % EX OINT: 1.0000 | TOPICAL_OINTMENT | Freq: Every day | CUTANEOUS | 0 refills | Status: AC

## 2022-08-19 MED ORDER — CEPHALEXIN 500 MG PO CAPS: 500.0000 mg | ORAL_CAPSULE | Freq: Two times a day (BID) | ORAL | 0 refills | Status: AC

## 2022-08-19 NOTE — ED Provider Triage Note (Signed)
Emergency Medicine Provider Triage Evaluation Note  [name] [name] , a 26 y.o. female  was evaluated in triage.  Pt complains of right leg swelling that is been ongoing for the last couple days.  She does report a prior history of DVT, was previously on Xarelto but states she does not take this medication as prescribed.  She reports there is a lot going on her life that she does not take this medication.  She denies any chest pain, no shortness of breath.  Review of Systems  Positive: Right leg swelling Negative: Sob,chest pain  Physical Exam  BP (!) 129/97 (BP Location: Right Arm)   Pulse 87   Temp 98.7 F (37.1 C) (Oral)   Resp 16   LMP 08/19/2022   SpO2 100%  Gen:   Awake, no distress   Resp:  Normal effort  MSK:   Moves extremities without difficulty  Other:  No calf tenderness, no pitting edema.,   Medical Decision Making  Medically screening exam initiated at 10:09 AM.  Appropriate orders placed.  [name] [name] was informed that the remainder of the evaluation will be completed by another provider, this initial triage assessment does not replace that evaluation, and the importance of remaining in the ED until their evaluation is complete.     [name], PA-C 08/19/22 1013

## 2022-08-19 NOTE — ED Triage Notes (Signed)
Patient c/o right leg pain and swelling x 2-3 days ago. Patient reports a previous DVT in that leg as well. Patient states she has not taken Xarelto in 2-3 weeks.

## 2022-08-19 NOTE — ED Provider Notes (Signed)
[facility] DEPT Provider Note   CSN: [id number] Arrival date & time: 08/19/22  0907     History Lupus, DVT on xarelto  Chief Complaint  Patient presents with   Leg Pain   Leg Swelling    [name] [name] is a 26 y.o. female.  26 year old female with a past medical history of DVT presents to the ED with a chief complaint of right leg swelling x a few days. She reports a prior history of DVT, reports she was taking Xarelto however has only been taking this sporadically as she has been undergoing a lot of pressure at home with the recent death of her brother.  She does report some swelling noted to her right lower leg she noted also some discoloration to the lateral aspect with some erythema.  She has not taken any medication for improvement in symptoms, there is no exacerbated factors.  She denies any fever, chest pain, shortness of breath.  The history is provided by the patient and medical records.  Leg Pain Location:  Leg Associated symptoms: no back pain and no fever        Home Medications Prior to Admission medications   Medication Sig Start Date End Date Taking? Authorizing Provider  cephALEXin (KEFLEX) 500 MG capsule Take 1 capsule (500 mg total) by mouth 2 (two) times daily for 7 days. 08/19/22 08/26/22 Yes Luiza Carranco, PA-C  bacitracin ointment Apply 1 Application topically 2 (two) times daily. 04/26/22   Tacy Learn, PA-C  DULoxetine (CYMBALTA) 30 MG capsule Take 1 capsule (30 mg total) by mouth 2 (two) times daily. 09/16/21   Salley Slaughter, NP  etonogestrel (NEXPLANON) 68 MG IMPL implant 68 mg by Subdermal route continuous. 06/25/20 06/13/23  [provider]  famotidine (PEPCID) 20 MG tablet Take 20 mg by mouth daily.    [provider]  fluconazole (DIFLUCAN) 150 MG tablet Take 1 tablet (150 mg total) by mouth daily. May repeat after 3 days if needed Patient not taking: Reported on 05/23/2022 04/07/22   Fenton Foy, NP  fluconazole (DIFLUCAN) 200 MG tablet Take 200 mg by mouth. Patient not taking: Reported on 05/23/2022 12/16/21   [provider]  folic acid (FOLVITE) 1 MG tablet Take 1 tablet (1 mg total) by mouth at bedtime. Patient taking differently: Take 1 mg by mouth daily. 03/25/19   Kayleen Memos, DO  hydroxychloroquine (PLAQUENIL) 200 MG tablet Take 100-200 mg by mouth See admin instructions. Takes 1 tablet in the morning and 1/2 tablet at night.    [provider]  losartan (COZAAR) 25 MG tablet Take 25 mg by mouth daily. Take once daily at  bedtime    [provider]  magnesium (MAGTAB) 84 MG (7MEQ) TBCR SR tablet Take 84 mg by mouth daily. 11/23/21   [provider]  mirtazapine (REMERON) 15 MG tablet Take 1 tablet (15 mg total) by mouth daily. 08/12/22   Fenton Foy, NP  mupirocin ointment (BACTROBAN) 2 % Apply 1 Application topically daily for 7 days. 08/19/22 08/26/22  [name], PA-C  mycophenolate (CELLCEPT) 500 MG tablet Take 1,500 mg by mouth 2 (two) times daily. 01/22/21   [provider]  PERIDEX 0.12 % solution 15 mLs 2 (two) times daily. 11/10/21   [provider]  rivaroxaban (XARELTO) 10 MG TABS tablet Take 1 tablet (10 mg total) by mouth daily with supper. 05/23/22   Celso Amy, NP  Suvorexant (BELSOMRA) 5 MG  TABS Take 5 mg by mouth at bedtime as needed. 09/16/21   Salley Slaughter, NP  tacrolimus (PROGRAF) 1 MG capsule Take by mouth. 07/02/21   [provider]  triamcinolone cream (KENALOG) 0.1 % Apply to AA daily and follow up in 30 days 02/16/22   Warren Danes, PA-C  Vitamin D, Ergocalciferol, (DRISDOL) 50000 units CAPS capsule Take 1 capsule (50,000 Units total) by mouth 2 (two) times a week. Patient taking differently: Take 50,000 Units by mouth 2 (two) times a week. Monday & Wednesday 12/21/17   Bo Merino, MD      Allergies    Lisinopril and Latex    Review of Systems   Review of Systems   Constitutional:  Negative for chills and fever.  HENT:  Negative for sore throat.   Respiratory:  Negative for shortness of breath.   Cardiovascular:  Positive for leg swelling. Negative for chest pain.  Gastrointestinal:  Negative for abdominal pain, nausea and vomiting.  Genitourinary:  Negative for flank pain.  Musculoskeletal:  Negative for back pain.  All other systems reviewed and are negative.   Physical Exam Updated Vital Signs BP (!) 142/97   Pulse 76   Temp 98.7 F (37.1 C) (Oral)   Resp 16   LMP 08/19/2022   SpO2 99%  Physical Exam Vitals and nursing note reviewed.  Constitutional:      Appearance: Normal appearance.  HENT:     Head: Normocephalic and atraumatic.     Nose: Nose normal.     Mouth/Throat:     Mouth: Mucous membranes are moist.  Eyes:     Pupils: Pupils are equal, round, and reactive to light.  Cardiovascular:     Rate and Rhythm: Normal rate.     Comments: No calf tenderness noted bilaterally.  Pulmonary:     Effort: Pulmonary effort is normal.     Breath sounds: No wheezing or rales.  Abdominal:     General: Abdomen is flat.     Tenderness: There is no abdominal tenderness.  Musculoskeletal:     Cervical back: Normal range of motion and neck supple.     Right lower leg: No edema.     Left lower leg: No edema.  Neurological:     Mental Status: She is alert.     ED Results / Procedures / Treatments   Labs (all labs ordered are listed, but only abnormal results are displayed) Labs Reviewed - No data to display  EKG None  Radiology VAS [place] LOWER EXTREMITY VENOUS (DVT)  Result Date: 08/19/2022  Lower Venous DVT Study Patient Name:  [name] [name]  Date of Exam:   08/19/2022 Medical Rec #: [id number]      Accession #:    [id number] Date of Birth: February 25, 1996       Patient Gender: F Patient Age:   16 years Exam Location:  [facility] Procedure:      VAS [place] LOWER EXTREMITY VENOUS (DVT) Referring Phys: Beverley Fiedler Hamdi Vari  --------------------------------------------------------------------------------  Indications: Pain.  Risk Factors: DVT RLE (2020) Lupus. Comparison Study: Previous exam on 11/24/2019 was negative for DVT Performing Technologist: Rogelia Rohrer RVT, RDMS  Examination Guidelines: A complete evaluation includes B-mode imaging, spectral Doppler, color Doppler, and power Doppler as needed of all accessible portions of each vessel. Bilateral testing is considered an integral part of a complete examination. Limited examinations for reoccurring indications may be performed as noted. The reflux portion of the exam is performed with the patient  in reverse Trendelenburg.  +---------+---------------+---------+-----------+----------+--------------+ RIGHT    CompressibilityPhasicitySpontaneityPropertiesThrombus Aging +---------+---------------+---------+-----------+----------+--------------+ CFV      Full           Yes      Yes                                 +---------+---------------+---------+-----------+----------+--------------+ SFJ      Full                                                        +---------+---------------+---------+-----------+----------+--------------+ FV Prox  Full           Yes      Yes                                 +---------+---------------+---------+-----------+----------+--------------+ FV Mid   Full           Yes      Yes                                 +---------+---------------+---------+-----------+----------+--------------+ FV DistalFull           Yes      Yes                                 +---------+---------------+---------+-----------+----------+--------------+ PFV      Full                                                        +---------+---------------+---------+-----------+----------+--------------+ POP      Full           Yes      Yes                                  +---------+---------------+---------+-----------+----------+--------------+ PTV      Full                                                        +---------+---------------+---------+-----------+----------+--------------+ PERO     Full                                                        +---------+---------------+---------+-----------+----------+--------------+   +----+---------------+---------+-----------+----------+--------------+ LEFTCompressibilityPhasicitySpontaneityPropertiesThrombus Aging +----+---------------+---------+-----------+----------+--------------+ CFV Full           Yes      Yes                                 +----+---------------+---------+-----------+----------+--------------+  Summary: RIGHT: - There is no evidence of deep vein thrombosis in the lower extremity.  - No cystic structure found in the popliteal fossa. - Ultrasound characteristics of enlarged lymph nodes are noted in the groin.  LEFT: - No evidence of common femoral vein obstruction.  *See table(s) above for measurements and observations.    Preliminary     Procedures Procedures    Medications Ordered in ED Medications - No data to display  ED Course/ Medical Decision Making/ A&P                           Medical Decision Making Risk Prescription drug management.    This patient presents to the ED for concern of right leg pain, this involves a number of treatment options, and is a complaint that carries with it a high risk of complications and morbidity.  The differential diagnosis includes cellulitis, DVT versus ischemia.    Co morbidities: Discussed in HPI   Brief History:  Patient here with right leg swelling to the lateral aspect x 2 days. Prior hx of DVT, non compliant with her Xarelto and takes this sporadically.   EMR reviewed including pt PMHx, past surgical history and past visits to ER.   See HPI for more details   Lab Tests:  I ordered and independently  interpreted labs.  The pertinent results include:    N/A  Imaging Studies:  Ultrasound showed: No acute DVT noted  Reevaluation:  After the interventions noted above I re-evaluated patient and found that they have :improved   Social Determinants of Health:  The patient's social determinants of health were a factor in the care of this patient  Problem List / ED Course:  Patient here with right leg swelling which began two days ago, prior hx of DVT but non compliant with Xarelto. She reports worsening swelling and also redness to the side of her legs.  Patient reports that she does not take this medication as she has been going through some family problems.  She is overall nontoxic-appearing, vitals are stable she denies any chest pain or shortness of breath.  No pain with palpation along the calf, no pitting edema noted, no shortness of breath to suggest CHF.  Ultrasound was obtained which did not show any DVT present.  She has been taking Xarelto for previous Results of the ultrasound were discussed with patient at length, I did discuss placing Bactroban to her leg in order to prevent further infection, she will go home on a prescription for antibiotics that she is aware she will not take unless the redness expands throughout her leg as she has had a prior history of cellulitis.  She is hemodynamically stable here, afebrile patient stable for discharge.  Dispostion:  After consideration of the diagnostic results and the patients response to treatment, I feel that the patent would benefit from continue Xarelto therapy along with follow up with your primary care physician.    Portions of this note were generated with [employer]. Dictation errors may occur despite best attempts at proofreading.   Final Clinical Impression(s) / ED Diagnoses Final diagnoses:  Right leg swelling    Rx / DC Orders ED Discharge Orders          Ordered    mupirocin ointment (BACTROBAN) 2 %   Daily        08/19/22 1420    cephALEXin (KEFLEX) 500 MG capsule  2 times daily  08/19/22 1433              [name], PA-C 08/19/22 1434    Milton Ferguson, MD 08/21/22 [phone]

## 2022-08-19 NOTE — Progress Notes (Signed)
RLE venous duplex has been completed.   Results can be found under chart review under CV PROC. 08/19/2022 2:13 PM Jaksen Fiorella RVT, RDMS

## 2022-08-19 NOTE — Discharge Instructions (Addendum)
Your ultrasound was negative for a DVT.  You will need to continue to take your xarelto as prescribed to you.    You were prescribed ointment to help treat your right leg swelling. Please apply to the area daily for the next several days.   If you experience any worsening symptoms, increased redness or fever please return to the ED.

## 2022-08-22 ENCOUNTER — Telehealth: Payer: Self-pay

## 2022-08-22 NOTE — Telephone Encounter (Signed)
Transition Care Management Follow-up Telephone Call Date of discharge and from where: 08/19/22 How have you been since you were released from the hospital? Per pt she has been ok Any questions or concerns? No  Items Reviewed: Did the pt receive and understand the discharge instructions provided? Yes  Medications obtained and verified? Yes  Other? No  Any new allergies since your discharge? No  Dietary orders reviewed? No Do you have support at home? Yes    Follow up appointments reviewed:  PCP Hospital f/u appt confirmed? Yes  Scheduled to see Tonya N.  on 08/25/22 @ 1:20 pm. [facility] f/u appt confirmed? No   Are transportation arrangements needed? Yes  If their condition worsens, is the pt aware to call PCP or go to the Emergency Dept.? Yes Was the patient provided with contact information for the PCP's office or ED? Yes Was to pt encouraged to call back with questions or concerns? Yes    Elyse Jarvis RMA

## 2022-08-25 ENCOUNTER — Encounter: Payer: Self-pay | Admitting: Nurse Practitioner

## 2022-08-25 ENCOUNTER — Ambulatory Visit (INDEPENDENT_AMBULATORY_CARE_PROVIDER_SITE_OTHER): Payer: 59 | Admitting: Nurse Practitioner

## 2022-08-25 VITALS — BP 134/94 | HR 86 | Ht 63.0 in | Wt 172.0 lb

## 2022-08-25 DIAGNOSIS — L02415 Cutaneous abscess of right lower limb: Secondary | ICD-10-CM | POA: Diagnosis not present

## 2022-08-25 NOTE — Assessment & Plan Note (Signed)
May wash with dial antibacterial soap  Continue antibiotic   Follow up:  Follow up as scheduled

## 2022-08-25 NOTE — Progress Notes (Signed)
$'@Patient'D$  ID: [name] [name], female    DOB: Jun 19, 1996, 26 y.o.   MRN: [id number]  Chief Complaint  Patient presents with   Hospitalization Follow-up    Right lower leg-- still painful and will start the antibiotic prescribe vy [name]    Referring provider: Fenton Foy, NP   HPI  Patient presents today for ED follow-up.  She was seen in the ED on 08/19/2022 for right lower leg swelling.  She was found to have an abscess.  She was treated with Bactroban ointment.  She was told to use this but if the area did not clear up they did write a prescription for cephalexin p.o.  Patient picked up this prescription today as well as start taking after lunch.  The area has opened and has been draining.  Patient is try to keep the area clean and dry.  We discussed that she can use Dial antibacterial soap. Denies f/c/s, n/v/d, hemoptysis, PND, leg swelling Denies chest pain or edema      Allergies  Allergen Reactions   Lisinopril Other (See Comments)     Angioedema    Latex Rash    Immunization History  Administered Date(s) Administered   Influenza,inj,Quad PF,6+ Mos 08/11/2015, 07/27/2018, 05/17/2019, 07/08/2021   Pneumococcal Conjugate-13 05/13/2016, 05/28/2021   Pneumococcal Polysaccharide-23 05/29/2019    Past Medical History:  Diagnosis Date   Accidental methotrexate overdose 01/08/2018   Acute lower UTI    Asthma    Bronchitis 03/19/2019   Cardiomegaly 05/08/2019   Chronic midline low back pain without sciatica 11/09/2016   Facet joint arthropathy   Dehydration 03/19/2019   Depo-Provera contraceptive status 08/11/2015   Depression 02/08/2017   Dysphagia 06/01/2018   Dysuria 04/19/2019   Elevated AST (SGOT) 03/19/2019   Essential hypertension 03/19/2019   Facial edema    Gout    Hypertension    IDA (iron deficiency anemia) 08/11/2015   Immunosuppressed status ([place])    Insomnia 01/29/2016   Leukopenia 01/10/2018   Lobar pneumonia ([place]) 06/07/2018   Lupus  ([place])    Lupus (systemic lupus erythematosus) ([place]) 25/85/2778   Metabolic acidosis 24/23/5361   Methotrexate toxicity    Mouth ulcers    Mucositis oral 01/12/2018   Odynophagia 06/01/2018   Oral thrush 03/19/2019   Pleuritic chest pain 06/05/2018   Primary osteoarthritis of both knees 11/09/2016   Rash due to systemic lupus erythematosus (SLE) ([place])    Raynaud disease    Raynaud's disease 08/11/2015   Relative acute adrenal insufficiency ([place]) 06/11/2018   Right leg DVT ([place]) 05/08/2019   Seasonal allergies 08/11/2015   Sepsis ([place]) 05/08/2019   Sinus tachycardia 03/19/2019   SLE (systemic lupus erythematosus) ([place]) 09/19/2016     Positive ANA, double-stranded DNA, Ro, RNP, RF, history of nasal ulcers, fatigue, anemia, arthritis, Raynauds   Thrush 06/01/2018   Vaginal candidiasis 01/12/2018   Vitamin D deficiency 11/09/2016    Tobacco History: Social History   Tobacco Use  Smoking Status Never  Smokeless Tobacco Never   Counseling given: Not Answered   Outpatient Encounter Medications as of 08/25/2022  Medication Sig   bacitracin ointment Apply 1 Application topically 2 (two) times daily.   DULoxetine (CYMBALTA) 30 MG capsule Take 1 capsule (30 mg total) by mouth 2 (two) times daily.   etonogestrel (NEXPLANON) 68 MG IMPL implant 68 mg by Subdermal route continuous.   famotidine (PEPCID) 20 MG tablet Take 20 mg by mouth daily.   fluconazole (DIFLUCAN) 150 MG tablet Take  1 tablet (150 mg total) by mouth daily. May repeat after 3 days if needed   fluconazole (DIFLUCAN) 200 MG tablet Take 200 mg by mouth.   folic acid (FOLVITE) 1 MG tablet Take 1 tablet (1 mg total) by mouth at bedtime. (Patient taking differently: Take 1 mg by mouth daily.)   hydroxychloroquine (PLAQUENIL) 200 MG tablet Take 100-200 mg by mouth See admin instructions. Takes 1 tablet in the morning and 1/2 tablet at night.   losartan (COZAAR) 25 MG tablet Take 25 mg by mouth daily. Take once daily at   bedtime   magnesium (MAGTAB) 84 MG (7MEQ) TBCR SR tablet Take 84 mg by mouth daily.   mirtazapine (REMERON) 15 MG tablet Take 1 tablet (15 mg total) by mouth daily.   mupirocin ointment (BACTROBAN) 2 % Apply 1 Application topically daily for 7 days.   mycophenolate (CELLCEPT) 500 MG tablet Take 1,500 mg by mouth 2 (two) times daily.   PERIDEX 0.12 % solution 15 mLs 2 (two) times daily.   rivaroxaban (XARELTO) 10 MG TABS tablet Take 1 tablet (10 mg total) by mouth daily with supper.   Suvorexant (BELSOMRA) 5 MG TABS Take 5 mg by mouth at bedtime as needed.   tacrolimus (PROGRAF) 1 MG capsule Take by mouth.   triamcinolone cream (KENALOG) 0.1 % Apply to AA daily and follow up in 30 days   Vitamin D, Ergocalciferol, (DRISDOL) 50000 units CAPS capsule Take 1 capsule (50,000 Units total) by mouth 2 (two) times a week. (Patient taking differently: Take 50,000 Units by mouth 2 (two) times a week. Monday & Wednesday)   cephALEXin (KEFLEX) 500 MG capsule Take 1 capsule (500 mg total) by mouth 2 (two) times daily for 7 days. (Patient not taking: Reported on 08/25/2022)   No facility-administered encounter medications on file as of 08/25/2022.     Review of Systems  Review of Systems  Constitutional: Negative.   HENT: Negative.    Cardiovascular: Negative.   Gastrointestinal: Negative.   Skin:        Abscess to right lower leg  Allergic/Immunologic: Negative.   Neurological: Negative.   Psychiatric/Behavioral: Negative.         Physical Exam  BP (!) 134/94   Pulse 86   Ht '5\' 3"'$  (1.6 m)   Wt 172 lb (78 kg)   LMP 08/19/2022   SpO2 99%   BMI 30.47 kg/m   Wt Readings from Last 5 Encounters:  08/25/22 172 lb (78 kg)  05/23/22 171 lb 12.8 oz (77.9 kg)  02/21/22 174 lb 6.4 oz (79.1 kg)  02/09/22 172 lb 1.9 oz (78.1 kg)  01/06/22 174 lb 6 oz (79.1 kg)     Physical Exam Skin:         Comments: Abscess noted to right lower extremity. No drainage noted.       Lab  Results:  CBC    Component Value Date/Time   WBC 6.7 05/23/2022 1341   WBC 8.0 01/04/2022 1928   RBC 3.60 (L) 05/23/2022 1341   RBC 3.60 (L) 05/23/2022 1341   HGB 9.8 (L) 05/23/2022 1341   HGB 9.2 (L) 12/03/2019 1449   HCT 30.2 (L) 05/23/2022 1341   HCT 28.6 (L) 12/03/2019 1449   PLT 356 05/23/2022 1341   PLT 174 12/03/2019 1449   MCV 83.9 05/23/2022 1341   MCV 81 12/03/2019 1449   MCH 27.2 05/23/2022 1341   MCHC 32.5 05/23/2022 1341   RDW 13.4 05/23/2022 1341   RDW  16.2 (H) 12/03/2019 1449   LYMPHSABS 2.1 05/23/2022 1341   LYMPHSABS 0.5 (L) 03/28/2018 1042   MONOABS 0.3 05/23/2022 1341   EOSABS 0.4 05/23/2022 1341   EOSABS 0.0 03/28/2018 1042   BASOSABS 0.0 05/23/2022 1341   BASOSABS 0.0 03/28/2018 1042    BMET    Component Value Date/Time   NA 138 05/23/2022 1341   NA 140 12/03/2019 1449   K 3.9 05/23/2022 1341   CL 107 05/23/2022 1341   CO2 25 05/23/2022 1341   GLUCOSE 72 05/23/2022 1341   BUN 15 05/23/2022 1341   BUN 11 12/03/2019 1449   CREATININE 1.39 (H) 05/23/2022 1341   CREATININE 0.57 12/11/2017 1018   CALCIUM 8.8 (L) 05/23/2022 1341   GFRNONAA 53 (L) 05/23/2022 1341   GFRNONAA 131 12/11/2017 1018   GFRAA >60 05/06/2020 1407   GFRAA 151 12/11/2017 1018    BNP    Component Value Date/Time   BNP 32.9 02/12/2021 1224    ProBNP No results found for: "PROBNP"  Imaging: VAS [place] LOWER EXTREMITY VENOUS (DVT)  Result Date: 08/19/2022  Lower Venous DVT Study Patient Name:  [name] [name]  Date of Exam:   08/19/2022 Medical Rec #: [id number]      Accession #:    [id number] Date of Birth: [birth year]       Patient Gender: F Patient Age:   28 years Exam Location:  [facility] Procedure:      VAS [place] LOWER EXTREMITY VENOUS (DVT) Referring Phys: Beverley Fiedler SOTO --------------------------------------------------------------------------------  Indications: Pain.  Risk Factors: DVT RLE (2020) Lupus. Comparison Study: Previous exam on 11/24/2019 was negative for DVT  Performing Technologist: Rogelia Rohrer RVT, RDMS  Examination Guidelines: A complete evaluation includes B-mode imaging, spectral Doppler, color Doppler, and power Doppler as needed of all accessible portions of each vessel. Bilateral testing is considered an integral part of a complete examination. Limited examinations for reoccurring indications may be performed as noted. The reflux portion of the exam is performed with the patient in reverse Trendelenburg.  +---------+---------------+---------+-----------+----------+--------------+ RIGHT    CompressibilityPhasicitySpontaneityPropertiesThrombus Aging +---------+---------------+---------+-----------+----------+--------------+ CFV      Full           Yes      Yes                                 +---------+---------------+---------+-----------+----------+--------------+ SFJ      Full                                                        +---------+---------------+---------+-----------+----------+--------------+ FV Prox  Full           Yes      Yes                                 +---------+---------------+---------+-----------+----------+--------------+ FV Mid   Full           Yes      Yes                                 +---------+---------------+---------+-----------+----------+--------------+ FV DistalFull           Yes  Yes                                 +---------+---------------+---------+-----------+----------+--------------+ PFV      Full                                                        +---------+---------------+---------+-----------+----------+--------------+ POP      Full           Yes      Yes                                 +---------+---------------+---------+-----------+----------+--------------+ PTV      Full                                                        +---------+---------------+---------+-----------+----------+--------------+ PERO     Full                                                         +---------+---------------+---------+-----------+----------+--------------+   +----+---------------+---------+-----------+----------+--------------+ LEFTCompressibilityPhasicitySpontaneityPropertiesThrombus Aging +----+---------------+---------+-----------+----------+--------------+ CFV Full           Yes      Yes                                 +----+---------------+---------+-----------+----------+--------------+     Summary: RIGHT: - There is no evidence of deep vein thrombosis in the lower extremity.  - No cystic structure found in the popliteal fossa. - Ultrasound characteristics of enlarged lymph nodes are noted in the groin.  LEFT: - No evidence of common femoral vein obstruction.  *See table(s) above for measurements and observations. Electronically signed by Servando Snare MD on 08/19/2022 at 5:17:30 PM.    Final      Assessment & Plan:   Cutaneous abscess of right lower extremity May wash with dial antibacterial soap  Continue antibiotic   Follow up:  Follow up as scheduled     Fenton Foy, NP 08/25/2022

## 2022-08-25 NOTE — Patient Instructions (Addendum)
1. Cutaneous abscess of right lower extremity  May wash with dial antibacterial soap  Continue antibiotic   Follow up:  Follow up as scheduled

## 2022-10-07 ENCOUNTER — Ambulatory Visit (INDEPENDENT_AMBULATORY_CARE_PROVIDER_SITE_OTHER): Payer: 59 | Admitting: Nurse Practitioner

## 2022-10-07 ENCOUNTER — Encounter: Payer: Self-pay | Admitting: Nurse Practitioner

## 2022-10-07 VITALS — BP 126/89 | HR 94 | Ht 61.0 in | Wt 183.2 lb

## 2022-10-07 DIAGNOSIS — Z6834 Body mass index (BMI) 34.0-34.9, adult: Secondary | ICD-10-CM | POA: Diagnosis not present

## 2022-10-07 DIAGNOSIS — E6609 Other obesity due to excess calories: Secondary | ICD-10-CM

## 2022-10-07 MED ORDER — MIRTAZAPINE 15 MG PO TABS: 15.0000 mg | ORAL_TABLET | Freq: Every day | ORAL | 2 refills | Status: DC

## 2022-10-07 NOTE — Patient Instructions (Addendum)
1. Class 1 obesity due to excess calories without serious comorbidity with body mass index (BMI) of 34.0 to 34.9 in adult  - Amb Ref to Medical Weight Management   Follow up:  Follow up in 3 months or sooner

## 2022-10-07 NOTE — Progress Notes (Signed)
@Patient  ID: [name] [name], female    DOB: Mar 23, 1996, 27 y.o.   MRN: [id number]  Chief Complaint  Patient presents with   Follow-up    No new problems    Referring provider: Fenton Foy, NP   HPI   Patient presents today for routine follow-up.  Overall she has been doing well.  Patient would like to try to lose weight.  Will refer her to medical weight management. Denies f/c/s, n/v/d, hemoptysis, PND, leg swelling Denies chest pain or edema       Allergies  Allergen Reactions   Lisinopril Other (See Comments)     Angioedema    Latex Rash    Immunization History  Administered Date(s) Administered   Influenza,inj,Quad PF,6+ Mos 08/11/2015, 07/27/2018, 05/17/2019, 07/08/2021   Pneumococcal Conjugate-13 05/13/2016, 05/28/2021   Pneumococcal Polysaccharide-23 05/29/2019    Past Medical History:  Diagnosis Date   Accidental methotrexate overdose 01/08/2018   Acute lower UTI    Asthma    Bronchitis 03/19/2019   Cardiomegaly 05/08/2019   Chronic midline low back pain without sciatica 11/09/2016   Facet joint arthropathy   Dehydration 03/19/2019   Depo-Provera contraceptive status 08/11/2015   Depression 02/08/2017   Dysphagia 06/01/2018   Dysuria 04/19/2019   Elevated AST (SGOT) 03/19/2019   Essential hypertension 03/19/2019   Facial edema    Gout    Hypertension    IDA (iron deficiency anemia) 08/11/2015   Immunosuppressed status ([place])    Insomnia 01/29/2016   Leukopenia 01/10/2018   Lobar pneumonia ([place]) 06/07/2018   Lupus ([place])    Lupus (systemic lupus erythematosus) ([place]) 51/10/5850   Metabolic acidosis 77/82/4235   Methotrexate toxicity    Mouth ulcers    Mucositis oral 01/12/2018   Odynophagia 06/01/2018   Oral thrush 03/19/2019   Pleuritic chest pain 06/05/2018   Primary osteoarthritis of both knees 11/09/2016   Rash due to systemic lupus erythematosus (SLE) ([place])    Raynaud disease    Raynaud's disease 08/11/2015   Relative acute  adrenal insufficiency ([place]) 06/11/2018   Right leg DVT ([place]) 05/08/2019   Seasonal allergies 08/11/2015   Sepsis ([place]) 05/08/2019   Sinus tachycardia 03/19/2019   SLE (systemic lupus erythematosus) ([place]) 09/19/2016     Positive ANA, double-stranded DNA, Ro, RNP, RF, history of nasal ulcers, fatigue, anemia, arthritis, Raynauds   Thrush 06/01/2018   Vaginal candidiasis 01/12/2018   Vitamin D deficiency 11/09/2016    Tobacco History: Social History   Tobacco Use  Smoking Status Never  Smokeless Tobacco Never   Counseling given: Not Answered   Outpatient Encounter Medications as of 10/07/2022  Medication Sig   bacitracin ointment Apply 1 Application topically 2 (two) times daily.   DULoxetine (CYMBALTA) 30 MG capsule Take 1 capsule (30 mg total) by mouth 2 (two) times daily.   etonogestrel (NEXPLANON) 68 MG IMPL implant 68 mg by Subdermal route continuous.   famotidine (PEPCID) 20 MG tablet Take 20 mg by mouth daily.   fluconazole (DIFLUCAN) 150 MG tablet Take 1 tablet (150 mg total) by mouth daily. May repeat after 3 days if needed   fluconazole (DIFLUCAN) 200 MG tablet Take 200 mg by mouth.   folic acid (FOLVITE) 1 MG tablet Take 1 tablet (1 mg total) by mouth at bedtime. (Patient taking differently: Take 1 mg by mouth daily.)   hydroxychloroquine (PLAQUENIL) 200 MG tablet Take 100-200 mg by mouth See admin instructions. Takes 1 tablet in the morning and 1/2 tablet at night.  losartan (COZAAR) 25 MG tablet Take 25 mg by mouth daily. Take once daily at  bedtime   magnesium (MAGTAB) 84 MG (7MEQ) TBCR SR tablet Take 84 mg by mouth daily.   mycophenolate (CELLCEPT) 500 MG tablet Take 1,500 mg by mouth 2 (two) times daily.   PERIDEX 0.12 % solution 15 mLs 2 (two) times daily.   rivaroxaban (XARELTO) 10 MG TABS tablet Take 1 tablet (10 mg total) by mouth daily with supper.   Suvorexant (BELSOMRA) 5 MG TABS Take 5 mg by mouth at bedtime as needed.   tacrolimus (PROGRAF) 1 MG capsule  Take by mouth.   triamcinolone cream (KENALOG) 0.1 % Apply to AA daily and follow up in 30 days   Vitamin D, Ergocalciferol, (DRISDOL) 50000 units CAPS capsule Take 1 capsule (50,000 Units total) by mouth 2 (two) times a week. (Patient taking differently: Take 50,000 Units by mouth 2 (two) times a week. Monday & Wednesday)   [DISCONTINUED] mirtazapine (REMERON) 15 MG tablet Take 1 tablet (15 mg total) by mouth daily.   [DISCONTINUED] mirtazapine (REMERON) 15 MG tablet Take 1 tablet (15 mg total) by mouth daily.   No facility-administered encounter medications on file as of 10/07/2022.     Review of Systems  Review of Systems  Constitutional: Negative.   HENT: Negative.    Cardiovascular: Negative.   Gastrointestinal: Negative.   Allergic/Immunologic: Negative.   Neurological: Negative.   Psychiatric/Behavioral: Negative.         Physical Exam  BP 126/89   Pulse 94   Ht 5\' 1"  (1.549 m)   Wt 183 lb 3.2 oz (83.1 kg)   SpO2 100%   BMI 34.62 kg/m   Wt Readings from Last 5 Encounters:  11/21/22 186 lb (84.4 kg)  10/07/22 183 lb 3.2 oz (83.1 kg)  08/25/22 172 lb (78 kg)  05/23/22 171 lb 12.8 oz (77.9 kg)  02/21/22 174 lb 6.4 oz (79.1 kg)     Physical Exam Vitals and nursing note reviewed.  Constitutional:      General: She is not in acute distress.    Appearance: She is well-developed.  Cardiovascular:     Rate and Rhythm: Normal rate and regular rhythm.  Pulmonary:     Effort: Pulmonary effort is normal.     Breath sounds: Normal breath sounds.  Neurological:     Mental Status: She is alert and oriented to person, place, and time.        Assessment & Plan:   Class 1 obesity due to excess calories without serious comorbidity with body mass index (BMI) of 34.0 to 34.9 in adult - Amb Ref to Medical Weight Management   Follow up:  Follow up in 3 months or sooner     Fenton Foy, NP 11/28/2022

## 2022-10-20 NOTE — Progress Notes (Signed)
This encounter was created in error - please disregard.

## 2022-11-08 ENCOUNTER — Other Ambulatory Visit: Payer: Self-pay | Admitting: Nurse Practitioner

## 2022-11-08 NOTE — Telephone Encounter (Signed)
Please advise KH 

## 2022-11-21 ENCOUNTER — Inpatient Hospital Stay: Payer: 59 | Attending: Hematology & Oncology

## 2022-11-21 ENCOUNTER — Inpatient Hospital Stay (HOSPITAL_BASED_OUTPATIENT_CLINIC_OR_DEPARTMENT_OTHER): Payer: 59 | Admitting: Family

## 2022-11-21 VITALS — BP 132/94 | HR 77 | Resp 18 | Ht 61.0 in | Wt 186.0 lb

## 2022-11-21 DIAGNOSIS — Z7901 Long term (current) use of anticoagulants: Secondary | ICD-10-CM | POA: Insufficient documentation

## 2022-11-21 DIAGNOSIS — I7300 Raynaud's syndrome without gangrene: Secondary | ICD-10-CM | POA: Insufficient documentation

## 2022-11-21 DIAGNOSIS — I82411 Acute embolism and thrombosis of right femoral vein: Secondary | ICD-10-CM

## 2022-11-21 DIAGNOSIS — E611 Iron deficiency: Secondary | ICD-10-CM | POA: Insufficient documentation

## 2022-11-21 DIAGNOSIS — D500 Iron deficiency anemia secondary to blood loss (chronic): Secondary | ICD-10-CM

## 2022-11-21 DIAGNOSIS — I2699 Other pulmonary embolism without acute cor pulmonale: Secondary | ICD-10-CM

## 2022-11-21 DIAGNOSIS — R760 Raised antibody titer: Secondary | ICD-10-CM

## 2022-11-21 DIAGNOSIS — Z86718 Personal history of other venous thrombosis and embolism: Secondary | ICD-10-CM | POA: Diagnosis not present

## 2022-11-21 DIAGNOSIS — M329 Systemic lupus erythematosus, unspecified: Secondary | ICD-10-CM | POA: Diagnosis not present

## 2022-11-21 LAB — CBC WITH DIFFERENTIAL (CANCER CENTER ONLY)
Abs Immature Granulocytes: 0.02 10*3/uL (ref 0.00–0.07)
Basophils Absolute: 0 10*3/uL (ref 0.0–0.1)
Basophils Relative: 0 %
Eosinophils Absolute: 0.2 10*3/uL (ref 0.0–0.5)
Eosinophils Relative: 2 %
HCT: 31.9 % — ABNORMAL LOW (ref 36.0–46.0)
Hemoglobin: 10.3 g/dL — ABNORMAL LOW (ref 12.0–15.0)
Immature Granulocytes: 0 %
Lymphocytes Relative: 31 %
Lymphs Abs: 2.1 10*3/uL (ref 0.7–4.0)
MCH: 27.4 pg (ref 26.0–34.0)
MCHC: 32.3 g/dL (ref 30.0–36.0)
MCV: 84.8 fL (ref 80.0–100.0)
Monocytes Absolute: 0.4 10*3/uL (ref 0.1–1.0)
Monocytes Relative: 7 %
Neutro Abs: 4.1 10*3/uL (ref 1.7–7.7)
Neutrophils Relative %: 60 %
Platelet Count: 337 10*3/uL (ref 150–400)
RBC: 3.76 MIL/uL — ABNORMAL LOW (ref 3.87–5.11)
RDW: 13.4 % (ref 11.5–15.5)
WBC Count: 6.8 10*3/uL (ref 4.0–10.5)
nRBC: 0 % (ref 0.0–0.2)

## 2022-11-21 LAB — CMP (CANCER CENTER ONLY)
ALT: 13 U/L (ref 0–44)
AST: 21 U/L (ref 15–41)
Albumin: 3.3 g/dL — ABNORMAL LOW (ref 3.5–5.0)
Alkaline Phosphatase: 70 U/L (ref 38–126)
Anion gap: 8 (ref 5–15)
BUN: 20 mg/dL (ref 6–20)
CO2: 23 mmol/L (ref 22–32)
Calcium: 8.8 mg/dL — ABNORMAL LOW (ref 8.9–10.3)
Chloride: 105 mmol/L (ref 98–111)
Creatinine: 1.58 mg/dL — ABNORMAL HIGH (ref 0.44–1.00)
GFR, Estimated: 45 mL/min — ABNORMAL LOW (ref >60)
Glucose, Bld: 75 mg/dL (ref 70–99)
Potassium: 4.2 mmol/L (ref 3.5–5.1)
Sodium: 136 mmol/L (ref 135–145)
Total Bilirubin: 0.2 mg/dL — ABNORMAL LOW (ref 0.3–1.2)
Total Protein: 8.3 g/dL — ABNORMAL HIGH (ref 6.5–8.1)

## 2022-11-21 LAB — IRON AND IRON BINDING CAPACITY (CC-WL,HP ONLY)
Iron: 44 ug/dL (ref 28–170)
Saturation Ratios: 17 % (ref 10.4–31.8)
TIBC: 267 ug/dL (ref 250–450)
UIBC: 223 ug/dL (ref 148–442)

## 2022-11-21 LAB — RETICULOCYTES
Immature Retic Fract: 7.2 % (ref 2.3–15.9)
RBC.: 3.75 MIL/uL — ABNORMAL LOW (ref 3.87–5.11)
Retic Count, Absolute: 57.8 10*3/uL (ref 19.0–186.0)
Retic Ct Pct: 1.5 % (ref 0.4–3.1)

## 2022-11-21 LAB — FERRITIN: Ferritin: 33 ng/mL (ref 11–307)

## 2022-11-21 NOTE — Progress Notes (Signed)
Hematology and Oncology Follow Up Visit  [name] [name] [id number] [birth year] 27 y.o. 11/21/2022   Principle Diagnosis:  DVT of the right lower extremity by [place] and VQ scan indeterminate of PE in August/September 2020 Lupus anticoagulant positive --transient positivity. Iron deficiency   Current Therapy:        IV iron as indicated  Xarelto 10 mg PO Daily   Interim History:  [name] [name] is here today for follow-up. She is doing fairly well but does note some fatigue.  She also gets a little dizzy when standing too quickly. She has not had any falls or syncope.  Her cycle has been heavy on Xarelto so she has not been taking. She restart and hold while on her cycle restarting after.  No other blood loss noted. No abnormal bruising, no petechiae.  No fever, chills, n/v, cough, rash, SOB, chest pain, palpitations, abdominal pain or changes in bowel or bladder habits.  No swelling, tenderness, numbness or tingling in her extremities.  Appetite and hydration are good. Weight is 183 lbs.    ECOG Performance Status: 1 - Symptomatic but completely ambulatory  Medications:  Allergies as of 11/21/2022       Reactions   Lisinopril Other (See Comments)    Angioedema    Latex Rash        Medication List        Accurate as of November 21, 2022  1:06 PM. If you have any questions, ask your nurse or doctor.          bacitracin ointment Apply 1 Application topically 2 (two) times daily.   Belsomra 5 MG Tabs Generic drug: Suvorexant Take 5 mg by mouth at bedtime as needed.   DULoxetine 30 MG capsule Commonly known as: Cymbalta Take 1 capsule (30 mg total) by mouth 2 (two) times daily.   famotidine 20 MG tablet Commonly known as: PEPCID Take 20 mg by mouth daily.   fluconazole 200 MG tablet Commonly known as: DIFLUCAN Take 200 mg by mouth.   fluconazole 150 MG tablet Commonly known as: Diflucan Take 1 tablet (150 mg total) by mouth daily. May repeat after 3 days if needed    folic acid 1 MG tablet Commonly known as: FOLVITE Take 1 tablet (1 mg total) by mouth at bedtime. What changed: when to take this   hydroxychloroquine 200 MG tablet Commonly known as: PLAQUENIL Take 100-200 mg by mouth See admin instructions. Takes 1 tablet in the morning and 1/2 tablet at night.   losartan 25 MG tablet Commonly known as: COZAAR Take 25 mg by mouth daily. Take once daily at  bedtime   magnesium 84 MG (7MEQ) Tbcr SR tablet Commonly known as: MAGTAB Take 84 mg by mouth daily.   mirtazapine 15 MG tablet Commonly known as: REMERON TAKE 1 TABLET (15 MG TOTAL) BY MOUTH DAILY.   mycophenolate 500 MG tablet Commonly known as: CELLCEPT Take 1,500 mg by mouth 2 (two) times daily.   Nexplanon 68 MG Impl implant Generic drug: etonogestrel 68 mg by Subdermal route continuous.   Peridex 0.12 % solution Generic drug: chlorhexidine 15 mLs 2 (two) times daily.   rivaroxaban 10 MG Tabs tablet Commonly known as: Xarelto Take 1 tablet (10 mg total) by mouth daily with supper.   tacrolimus 1 MG capsule Commonly known as: PROGRAF Take by mouth.   triamcinolone cream 0.1 % Commonly known as: KENALOG Apply to AA daily and follow up in 30 days   Vitamin D (Ergocalciferol)  1.25 MG (50000 UNIT) Caps capsule Commonly known as: DRISDOL Take 1 capsule (50,000 Units total) by mouth 2 (two) times a week. What changed: additional instructions        Allergies:  Allergies  Allergen Reactions   Lisinopril Other (See Comments)     Angioedema    Latex Rash    Past Medical History, Surgical history, Social history, and Family History were reviewed and updated.  Review of Systems: All other 10 point review of systems is negative.   Physical Exam:  vitals were not taken for this visit.   Wt Readings from Last 3 Encounters:  10/07/22 183 lb 3.2 oz (83.1 kg)  08/25/22 172 lb (78 kg)  05/23/22 171 lb 12.8 oz (77.9 kg)    Ocular: Sclerae unicteric, pupils equal,  round and reactive to light Ear-nose-throat: Oropharynx clear, dentition fair Lymphatic: No cervical or supraclavicular adenopathy Lungs no rales or rhonchi, good excursion bilaterally Heart regular rate and rhythm, no murmur appreciated Abd soft, nontender, positive bowel sounds MSK no focal spinal tenderness, no joint edema Neuro: non-focal, well-oriented, appropriate affect Breasts: Deferred   Lab Results  Component Value Date   WBC 6.7 05/23/2022   HGB 9.8 (L) 05/23/2022   HCT 30.2 (L) 05/23/2022   MCV 83.9 05/23/2022   PLT 356 05/23/2022   Lab Results  Component Value Date   FERRITIN 159 05/23/2022   IRON 46 05/23/2022   TIBC 210 (L) 05/23/2022   UIBC 164 05/23/2022   IRONPCTSAT 22 05/23/2022   Lab Results  Component Value Date   RETICCTPCT 1.7 05/23/2022   RBC 3.60 (L) 05/23/2022   RBC 3.60 (L) 05/23/2022   No results found for: "KPAFRELGTCHN", "LAMBDASER", "KAPLAMBRATIO" No results found for: "IGGSERUM", "IGA", "IGMSERUM" No results found for: "TOTALPROTELP", "ALBUMINELP", "A1GS", "A2GS", "BETS", "BETA2SER", "GAMS", "MSPIKE", "SPEI"   Chemistry      Component Value Date/Time   NA 138 05/23/2022 1341   NA 140 12/03/2019 1449   K 3.9 05/23/2022 1341   CL 107 05/23/2022 1341   CO2 25 05/23/2022 1341   BUN 15 05/23/2022 1341   BUN 11 12/03/2019 1449   CREATININE 1.39 (H) 05/23/2022 1341   CREATININE 0.57 12/11/2017 1018      Component Value Date/Time   CALCIUM 8.8 (L) 05/23/2022 1341   ALKPHOS 71 05/23/2022 1341   AST 14 (L) 05/23/2022 1341   ALT 7 05/23/2022 1341   BILITOT 0.2 (L) 05/23/2022 1341       Impression and Plan: [name] is a very pleasant 27 yo African American female with history of systemic lupus erythematosus and Raynaud's. She was diagnosed with right lower extremity DVT back in August 2020.   She will restarted her Xarelto at 10 mg PO daily maintenance.   Iron studies are pending. We will replace if needed.  Follow-up in 6 months.    Lottie Dawson, NP 3/11/20241:06 PM

## 2022-11-22 LAB — LUPUS ANTICOAGULANT PANEL
DRVVT: 37.8 s (ref 0.0–47.0)
PTT Lupus Anticoagulant: 38.1 s (ref 0.0–43.5)

## 2022-11-24 ENCOUNTER — Encounter: Payer: Self-pay | Admitting: Hematology & Oncology

## 2022-11-28 ENCOUNTER — Encounter: Payer: Self-pay | Admitting: Nurse Practitioner

## 2022-11-28 DIAGNOSIS — E6609 Other obesity due to excess calories: Secondary | ICD-10-CM | POA: Insufficient documentation

## 2022-11-28 NOTE — Assessment & Plan Note (Signed)
-   Amb Ref to Medical Weight Management   Follow up:  Follow up in 3 months or sooner

## 2022-12-07 ENCOUNTER — Encounter (INDEPENDENT_AMBULATORY_CARE_PROVIDER_SITE_OTHER): Payer: 59 | Admitting: Family Medicine

## 2022-12-21 ENCOUNTER — Encounter (INDEPENDENT_AMBULATORY_CARE_PROVIDER_SITE_OTHER): Payer: 59 | Admitting: Family Medicine

## 2023-01-19 LAB — RESULTS CONSOLE HPV: CHL HPV: POSITIVE

## 2023-01-19 LAB — HM PAP SMEAR

## 2023-01-23 ENCOUNTER — Ambulatory Visit (INDEPENDENT_AMBULATORY_CARE_PROVIDER_SITE_OTHER): Payer: 59 | Admitting: Nurse Practitioner

## 2023-01-23 VITALS — BP 136/88 | HR 78 | Temp 97.1°F | Ht 62.0 in | Wt 187.4 lb

## 2023-01-23 DIAGNOSIS — Z8481 Family history of carrier of genetic disease: Secondary | ICD-10-CM

## 2023-01-23 NOTE — Progress Notes (Unsigned)
@Patient  ID: [name] [name], female    DOB: 01/06/1996, 27 y.o.   MRN: [id number]  Chief Complaint  Patient presents with   Rash    thigh    Referring provider: Ivonne Andrew, NP   HPI  Patient presents today for a rash.  She was just seen by her OB/GYN on 01/19/2023 for this issue and does have a follow-up scheduled with them.  The rash is located to her inner thighs and vaginal region.  Patient is requesting a mammogram to be ordered due to her aunt currently having breast cancer. Denies f/c/s, n/v/d, hemoptysis, PND, leg swelling Denies chest pain or edema    Allergies  Allergen Reactions   Lisinopril Other (See Comments)     Angioedema    Latex Rash    Immunization History  Administered Date(s) Administered   Influenza,inj,Quad PF,6+ Mos 08/11/2015, 07/27/2018, 05/17/2019, 07/08/2021   Pneumococcal Conjugate-13 05/13/2016, 05/28/2021   Pneumococcal Polysaccharide-23 05/29/2019    Past Medical History:  Diagnosis Date   Accidental methotrexate overdose 01/08/2018   Acute lower UTI    Asthma    Bronchitis 03/19/2019   Cardiomegaly 05/08/2019   Chronic midline low back pain without sciatica 11/09/2016   Facet joint arthropathy   Dehydration 03/19/2019   Depo-Provera contraceptive status 08/11/2015   Depression 02/08/2017   Dysphagia 06/01/2018   Dysuria 04/19/2019   Elevated AST (SGOT) 03/19/2019   Essential hypertension 03/19/2019   Facial edema    Gout    Hypertension    IDA (iron deficiency anemia) 08/11/2015   Immunosuppressed status (HCC)    Insomnia 01/29/2016   Leukopenia 01/10/2018   Lobar pneumonia (HCC) 06/07/2018   Lupus (HCC)    Lupus (systemic lupus erythematosus) (HCC) 03/19/2019   Metabolic acidosis 03/19/2019   Methotrexate toxicity    Mouth ulcers    Mucositis oral 01/12/2018   Odynophagia 06/01/2018   Oral thrush 03/19/2019   Pleuritic chest pain 06/05/2018   Primary osteoarthritis of both knees 11/09/2016   Rash due to systemic  lupus erythematosus (SLE) (HCC)    Raynaud disease    Raynaud's disease 08/11/2015   Relative acute adrenal insufficiency (HCC) 06/11/2018   Right leg DVT (HCC) 05/08/2019   Seasonal allergies 08/11/2015   Sepsis (HCC) 05/08/2019   Sinus tachycardia 03/19/2019   SLE (systemic lupus erythematosus) (HCC) 09/19/2016     Positive ANA, double-stranded DNA, Ro, RNP, RF, history of nasal ulcers, fatigue, anemia, arthritis, Raynauds   Thrush 06/01/2018   Vaginal candidiasis 01/12/2018   Vitamin D deficiency 11/09/2016    Tobacco History: Social History   Tobacco Use  Smoking Status Never  Smokeless Tobacco Never   Counseling given: Not Answered   Outpatient Encounter Medications as of 01/23/2023  Medication Sig   DULoxetine (CYMBALTA) 30 MG capsule Take 1 capsule (30 mg total) by mouth 2 (two) times daily.   etonogestrel (NEXPLANON) 68 MG IMPL implant 68 mg by Subdermal route continuous.   famotidine (PEPCID) 20 MG tablet Take 20 mg by mouth daily.   folic acid (FOLVITE) 1 MG tablet Take 1 tablet (1 mg total) by mouth at bedtime.   hydroxychloroquine (PLAQUENIL) 200 MG tablet Take 100-200 mg by mouth See admin instructions. Takes 1 tablet in the morning and 1/2 tablet at night.   losartan (COZAAR) 25 MG tablet Take 25 mg by mouth daily. Take once daily at  bedtime   magnesium (MAGTAB) 84 MG ( ) TBCR SR tablet Take 84 mg by mouth daily.   mirtazapine (  REMERON) 15 MG tablet TAKE 1 TABLET (15 MG TOTAL) BY MOUTH DAILY.   mycophenolate (CELLCEPT) 500 MG tablet Take 1,500 mg by mouth 2 (two) times daily.   PERIDEX 0.12 % solution 15 mLs 2 (two) times daily.   rivaroxaban (XARELTO) 10 MG TABS tablet Take 1 tablet (10 mg total) by mouth daily with supper.   Suvorexant (BELSOMRA) 5 MG TABS Take 5 mg by mouth at bedtime as needed.   tacrolimus (PROGRAF) 1 MG capsule Take by mouth.   triamcinolone cream (KENALOG) 0.1 % Apply to AA daily and follow up in 30 days   VITAMIN D PO Take by mouth.    bacitracin ointment Apply 1 Application topically 2 (two) times daily. (Patient not taking: Reported on 01/23/2023)   fluconazole (DIFLUCAN) 150 MG tablet Take 1 tablet (150 mg total) by mouth daily. May repeat after 3 days if needed (Patient not taking: Reported on 01/23/2023)   fluconazole (DIFLUCAN) 200 MG tablet Take 200 mg by mouth. (Patient not taking: Reported on 01/23/2023)   Vitamin D, Ergocalciferol, (DRISDOL) 50000 units CAPS capsule Take 1 capsule (50,000 Units total) by mouth 2 (two) times a week. (Patient not taking: Reported on 01/23/2023)   No facility-administered encounter medications on file as of 01/23/2023.     Review of Systems  Review of Systems  Constitutional: Negative.   HENT: Negative.    Cardiovascular: Negative.   Gastrointestinal: Negative.   Allergic/Immunologic: Negative.   Neurological: Negative.   Psychiatric/Behavioral: Negative.         Physical Exam  BP 136/88   Pulse 78   Temp (!) 97.1 F (36.2 C)   Ht 5\' 2"  (1.575 m)   Wt 187 lb 6.4 oz (85 kg)   SpO2 96%   BMI 34.28 kg/m   Wt Readings from Last 5 Encounters:  01/23/23 187 lb 6.4 oz (85 kg)  11/21/22 186 lb (84.4 kg)  10/07/22 183 lb 3.2 oz (83.1 kg)  08/25/22 172 lb (78 kg)  05/23/22 171 lb 12.8 oz (77.9 kg)     Physical Exam Vitals and nursing note reviewed.  Constitutional:      General: She is not in acute distress.    Appearance: She is well-developed.  Cardiovascular:     Rate and Rhythm: Normal rate and regular rhythm.  Pulmonary:     Effort: Pulmonary effort is normal.     Breath sounds: Normal breath sounds.  Neurological:     Mental Status: She is alert and oriented to person, place, and time.      Lab Results:  CBC    Component Value Date/Time   WBC 6.8 11/21/2022 1247   WBC 8.0 01/04/2022 1928   RBC 3.75 (L) 11/21/2022 1248   RBC 3.76 (L) 11/21/2022 1247   HGB 10.3 (L) 11/21/2022 1247   HGB 9.2 (L) 12/03/2019 1449   HCT 31.9 (L) 11/21/2022 1247    HCT 28.6 (L) 12/03/2019 1449   PLT 337 11/21/2022 1247   PLT 174 12/03/2019 1449   MCV 84.8 11/21/2022 1247   MCV 81 12/03/2019 1449   MCH 27.4 11/21/2022 1247   MCHC 32.3 11/21/2022 1247   RDW 13.4 11/21/2022 1247   RDW 16.2 (H) 12/03/2019 1449   LYMPHSABS 2.1 11/21/2022 1247   LYMPHSABS 0.5 (L) 03/28/2018 1042   MONOABS 0.4 11/21/2022 1247   EOSABS 0.2 11/21/2022 1247   EOSABS 0.0 03/28/2018 1042   BASOSABS 0.0 11/21/2022 1247   BASOSABS 0.0 03/28/2018 1042    BMET  Component Value Date/Time   NA 136 11/21/2022 1247   NA 140 12/03/2019 1449   K 4.2 11/21/2022 1247   CL 105 11/21/2022 1247   CO2 23 11/21/2022 1247   GLUCOSE 75 11/21/2022 1247   BUN 20 11/21/2022 1247   BUN 11 12/03/2019 1449   CREATININE 1.58 (H) 11/21/2022 1247   CREATININE 0.57 12/11/2017 1018   CALCIUM 8.8 (L) 11/21/2022 1247   GFRNONAA 45 (L) 11/21/2022 1247   GFRNONAA 131 12/11/2017 1018   GFRAA >60 05/06/2020 1407   GFRAA 151 12/11/2017 1018    BNP    Component Value Date/Time   BNP 32.9 02/12/2021 1224      Assessment & Plan:   Family history of breast cancer gene mutation in first degree relative - MM Digital Screening; Future   Follow up:  Follow up as scheduled     Ivonne Andrew, NP 01/25/2023

## 2023-01-25 ENCOUNTER — Encounter: Payer: Self-pay | Admitting: Nurse Practitioner

## 2023-01-25 DIAGNOSIS — Z8481 Family history of carrier of genetic disease: Secondary | ICD-10-CM | POA: Insufficient documentation

## 2023-01-25 NOTE — Patient Instructions (Addendum)
1. Family history of breast cancer gene mutation in first degree relative  - MM Digital Screening; Future   Follow up:  Follow up as scheduled

## 2023-01-25 NOTE — Assessment & Plan Note (Signed)
-   MM Digital Screening; Future   Follow up:  Follow up as scheduled

## 2023-01-26 ENCOUNTER — Inpatient Hospital Stay: Admission: RE | Admit: 2023-01-26 | Payer: 59 | Source: Ambulatory Visit

## 2023-02-07 ENCOUNTER — Other Ambulatory Visit: Payer: Self-pay | Admitting: Rheumatology

## 2023-02-08 ENCOUNTER — Other Ambulatory Visit: Payer: Self-pay

## 2023-02-08 NOTE — Telephone Encounter (Signed)
From: Cleta Alberts To: Office of Ivonne Andrew, NP Sent: 02/07/2023 7:54 PM EDT Subject: Medication Renewal Request  Refills have been requested for the following medications:   mycophenolate (CELLCEPT) 500 MG tablet  Preferred pharmacy: [id number] - [place], [place] - [address] Delivery method: Pickup   Medication renewals requested in this message routed separately:   Vitamin D, Ergocalciferol, (DRISDOL) 50000 units CAPS capsule [Shaili Deveshwar]

## 2023-02-08 NOTE — Telephone Encounter (Signed)
Please advise KH 

## 2023-02-16 ENCOUNTER — Encounter (INDEPENDENT_AMBULATORY_CARE_PROVIDER_SITE_OTHER): Payer: 59 | Admitting: Internal Medicine

## 2023-02-20 ENCOUNTER — Ambulatory Visit: Payer: Self-pay | Admitting: Nurse Practitioner

## 2023-03-06 ENCOUNTER — Other Ambulatory Visit: Payer: Self-pay | Admitting: Nurse Practitioner

## 2023-03-06 DIAGNOSIS — B379 Candidiasis, unspecified: Secondary | ICD-10-CM

## 2023-03-06 NOTE — Telephone Encounter (Signed)
Please advise in [place] absence.

## 2023-03-06 NOTE — Telephone Encounter (Signed)
Please advise KH 

## 2023-03-09 ENCOUNTER — Ambulatory Visit (INDEPENDENT_AMBULATORY_CARE_PROVIDER_SITE_OTHER): Payer: 59

## 2023-03-09 VITALS — Ht 62.0 in | Wt 190.2 lb

## 2023-03-09 DIAGNOSIS — Z0000 Encounter for general adult medical examination without abnormal findings: Secondary | ICD-10-CM

## 2023-03-09 DIAGNOSIS — Z1159 Encounter for screening for other viral diseases: Secondary | ICD-10-CM

## 2023-03-09 NOTE — Patient Instructions (Signed)
[name] , Thank you for taking time to come for your Medicare Wellness Visit. I appreciate your ongoing commitment to your health goals. Please review the following plan we discussed and let me know if I can assist you in the future.   These are the goals we discussed:  Goals       Patient Stated (pt-stated)      Lose weight.        This is a list of the screening recommended for you and due dates:  Health Maintenance  Topic Date Due   COVID-19 Vaccine (1) Never done   Hepatitis C Screening  Never done   DTaP/Tdap/Td vaccine (1 - Tdap) Never done   Pap Smear  02/15/2020   Pap Smear  02/15/2020   Flu Shot  04/13/2023   Medicare Annual Wellness Visit  03/08/2024   HIV Screening  Completed   HPV Vaccine  Aged Out    Advanced directives: Advance directive discussed with you today. Even though you declined this today, please call our office should you change your mind, and we can give you the proper paperwork for you to fill out. Advance care planning is a way to make decisions about medical care that fits your values in case you are ever unable to make these decisions for yourself.  Information on Advanced Care Planning can be found at [facility] [place] Advance Health Care Directives Advance Health Care Directives ([email or web address]    Conditions/risks identified:  An order for a hepatitis c screening has been placed for you.This is a lab draw and you do not need to fast for this.   You are due for your tetanus vaccine. You can have this done at your preferred pharmacy  Next appointment: VIRTUAL/TELEPHONE VISIT Follow up in one year for your annual wellness visit.  March 14, 2024 at 2pm telephone visit  Preventive Care 29-66 Years Old, Female Preventive care refers to lifestyle choices and visits with your health care provider that can promote health and wellness. Preventive care visits are also called wellness exams. What can I expect for my preventive care  visit? Counseling During your preventive care visit, your health care provider may ask about your: Medical history, including: Past medical problems. Family medical history. Pregnancy history. Current health, including: Menstrual cycle. Method of birth control. Emotional well-being. Home life and relationship well-being. Sexual activity and sexual health. Lifestyle, including: Alcohol, nicotine or tobacco, and drug use. Access to firearms. Diet, exercise, and sleep habits. Work and work Astronomer. Sunscreen use. Safety issues such as seatbelt and bike helmet use. Physical exam Your health care provider may check your: Height and weight. These may be used to calculate your BMI (body mass index). BMI is a measurement that tells if you are at a healthy weight. Waist circumference. This measures the distance around your waistline. This measurement also tells if you are at a healthy weight and may help predict your risk of certain diseases, such as type 2 diabetes and high blood pressure. Heart rate and blood pressure. Body temperature. Skin for abnormal spots. What immunizations do I need? Vaccines are usually given at various ages, according to a schedule. Your health care provider will recommend vaccines for you based on your age, medical history, and lifestyle or other factors, such as travel or where you work. What tests do I need? Screening Your health care provider may recommend screening tests for certain conditions. This may include: Pelvic exam and Pap test. Lipid and cholesterol  levels. Diabetes screening. This is done by checking your blood sugar (glucose) after you have not eaten for a while (fasting). Hepatitis B test. Hepatitis C test. HIV (human immunodeficiency virus) test. STI (sexually transmitted infection) testing, if you are at risk. BRCA-related cancer screening. This may be done if you have a family history of breast, ovarian, tubal, or peritoneal  cancers. Talk with your health care provider about your test results, treatment options, and if necessary, the need for more tests. Follow these instructions at home: Eating and drinking  Eat a healthy diet that includes fresh fruits and vegetables, whole grains, lean protein, and low-fat dairy products. Take vitamin and mineral supplements as recommended by your health care provider. Do not drink alcohol if: Your health care provider tells you not to drink. You are pregnant, may be pregnant, or are planning to become pregnant. If you drink alcohol: Limit how much you have to 0-1 drink a day. Know how much alcohol is in your drink. In the [place] one drink equals one 12 oz bottle of beer (355 mL), one 5 oz glass of wine (148 mL), or one 1 oz glass of hard liquor (44 mL). Lifestyle Brush your teeth every morning and night with fluoride toothpaste. Floss one time each day. Exercise for at least 30 minutes 5 or more days each week. Do not use any products that contain nicotine or tobacco. These products include cigarettes, chewing tobacco, and vaping devices, such as e-cigarettes. If you need help quitting, ask your health care provider. Do not use drugs. If you are sexually active, practice safe sex. Use a condom or other form of protection to prevent STIs. If you do not wish to become pregnant, use a form of birth control. If you plan to become pregnant, see your health care provider for a prepregnancy visit. Find healthy ways to manage stress, such as: Meditation, yoga, or listening to music. Journaling. Talking to a trusted person. Spending time with friends and family. Minimize exposure to UV radiation to reduce your risk of skin cancer. Safety Always wear your seat belt while driving or riding in a vehicle. Do not drive: If you have been drinking alcohol. Do not ride with someone who has been drinking. If you have been using any mind-altering substances or drugs. While texting. When  you are tired or distracted. Wear a helmet and other protective equipment during sports activities. If you have firearms in your house, make sure you follow all gun safety procedures. Seek help if you have been physically or sexually abused. What's next? Go to your health care provider once a year for an annual wellness visit. Ask your health care provider how often you should have your eyes and teeth checked. Stay up to date on all vaccines. This information is not intended to replace advice given to you by your health care provider. Make sure you discuss any questions you have with your health care provider. Document Revised: 02/24/2021 Document Reviewed: 02/24/2021 Elsevier Patient Education  2022 [employer].

## 2023-03-09 NOTE — Progress Notes (Signed)
 Subjective:   [name] [name] is a 27 y.o. female who presents for Medicare Annual (Subsequent) preventive examination.  Visit Complete: Virtual  I connected with  Houda L Chesterfield on 03/09/23 by a audio enabled telemedicine application and verified that I am speaking with the correct person using two identifiers.  Patient Location: Home  Provider Location: Home Office  I discussed the limitations of evaluation and management by telemedicine. The patient expressed understanding and agreed to proceed.  Patient Medicare AWV questionnaire was completed by the patient on n/a; I have confirmed that all information answered by patient is correct and no changes since this date.  Review of Systems     Cardiac Risk Factors include: dyslipidemia;hypertension;obesity (BMI >30kg/m2);sedentary lifestyle;Other (see comment), Risk factor comments: Lupus     Objective:    Today's Vitals   03/09/23 0901 03/09/23 0903  Weight: 190 lb 3.2 oz (86.3 kg)   Height: 5\' 2"  (1.575 m)   PainSc:  8    Body mass index is 34.79 kg/m.     03/09/2023    9:01 AM 11/21/2022   12:57 PM 08/19/2022   10:04 AM 05/23/2022    2:09 PM 04/26/2022   10:20 AM 02/09/2022   12:16 PM 01/05/2022   11:48 AM  Advanced Directives  Does Patient Have a Medical Advance Directive? No No No No No No No  Would patient like information on creating a medical advance directive? No - Patient declined   No - Patient declined No - Patient declined No - Patient declined No - Patient declined    Current Medications (verified) Outpatient Encounter Medications as of 03/09/2023  Medication Sig   famotidine (PEPCID) 20 MG tablet Take 20 mg by mouth daily.   hydroxychloroquine (PLAQUENIL) 200 MG tablet Take 100-200 mg by mouth See admin instructions. Takes 1 tablet in the morning and 1/2 tablet at night.   mycophenolate (CELLCEPT) 500 MG tablet Take 1,500 mg by mouth 2 (two) times daily.   tacrolimus (PROGRAF) 1 MG capsule Take by mouth.    bacitracin ointment Apply 1 Application topically 2 (two) times daily. (Patient not taking: Reported on 01/23/2023)   DULoxetine (CYMBALTA) 30 MG capsule Take 1 capsule (30 mg total) by mouth 2 (two) times daily.   etonogestrel (NEXPLANON) 68 MG IMPL implant 68 mg by Subdermal route continuous.   fluconazole (DIFLUCAN) 150 MG tablet Take 1 tablet (150 mg total) by mouth daily. May repeat after 3 days if needed (Patient not taking: Reported on 01/23/2023)   fluconazole (DIFLUCAN) 200 MG tablet Take 200 mg by mouth. (Patient not taking: Reported on 01/23/2023)   folic acid (FOLVITE) 1 MG tablet Take 1 tablet (1 mg total) by mouth at bedtime.   losartan (COZAAR) 25 MG tablet Take 25 mg by mouth daily. Take once daily at  bedtime   magnesium (MAGTAB) 84 MG ( ) TBCR SR tablet Take 84 mg by mouth daily.   mirtazapine (REMERON) 15 MG tablet TAKE 1 TABLET (15 MG TOTAL) BY MOUTH DAILY.   PERIDEX 0.12 % solution 15 mLs 2 (two) times daily.   rivaroxaban (XARELTO) 10 MG TABS tablet Take 1 tablet (10 mg total) by mouth daily with supper.   Suvorexant (BELSOMRA) 5 MG TABS Take 5 mg by mouth at bedtime as needed.   triamcinolone cream (KENALOG) 0.1 % Apply to AA daily and follow up in 30 days   VITAMIN D PO Take by mouth.   Vitamin D, Ergocalciferol, (DRISDOL) 50000 units CAPS capsule Take  1 capsule (50,000 Units total) by mouth 2 (two) times a week. (Patient not taking: Reported on 01/23/2023)   No facility-administered encounter medications on file as of 03/09/2023.    Allergies (verified) Lisinopril and Latex   History: Past Medical History:  Diagnosis Date   Accidental methotrexate overdose 01/08/2018   Acute lower UTI    Asthma    Bronchitis 03/19/2019   Cardiomegaly 05/08/2019   Chronic midline low back pain without sciatica 11/09/2016   Facet joint arthropathy   Dehydration 03/19/2019   Depo-Provera contraceptive status 08/11/2015   Depression 02/08/2017   Dysphagia 06/01/2018   Dysuria  04/19/2019   Elevated AST (SGOT) 03/19/2019   Essential hypertension 03/19/2019   Facial edema    Gout    Hypertension    IDA (iron deficiency anemia) 08/11/2015   Immunosuppressed status (HCC)    Insomnia 01/29/2016   Leukopenia 01/10/2018   Lobar pneumonia (HCC) 06/07/2018   Lupus (HCC)    Lupus (systemic lupus erythematosus) (HCC) 03/19/2019   Metabolic acidosis 03/19/2019   Methotrexate toxicity    Mouth ulcers    Mucositis oral 01/12/2018   Odynophagia 06/01/2018   Oral thrush 03/19/2019   Pleuritic chest pain 06/05/2018   Primary osteoarthritis of both knees 11/09/2016   Rash due to systemic lupus erythematosus (SLE) (HCC)    Raynaud disease    Raynaud's disease 08/11/2015   Relative acute adrenal insufficiency (HCC) 06/11/2018   Right leg DVT (HCC) 05/08/2019   Seasonal allergies 08/11/2015   Sepsis (HCC) 05/08/2019   Sinus tachycardia 03/19/2019   SLE (systemic lupus erythematosus) (HCC) 09/19/2016     Positive ANA, double-stranded DNA, Ro, RNP, RF, history of nasal ulcers, fatigue, anemia, arthritis, Raynauds   Thrush 06/01/2018   Vaginal candidiasis 01/12/2018   Vitamin D deficiency 11/09/2016   Past Surgical History:  Procedure Laterality Date   ORIF SHOULDER FRACTURE Left    TONSILLECTOMY     Family History  Problem Relation Age of Onset   Aneurysm Mother    Stroke Sister    Seizures Sister    Lung cancer Maternal Aunt    Heart attack Paternal Uncle    Colon cancer Neg Hx    Esophageal cancer Neg Hx    Pancreatic cancer Neg Hx    Stomach cancer Neg Hx    Social History   Socioeconomic History   Marital status: Single    Spouse name: Not on file   Number of children: Not on file   Years of education: Not on file   Highest education level: Not on file  Occupational History   Not on file  Tobacco Use   Smoking status: Never   Smokeless tobacco: Never  Vaping Use   Vaping Use: Never used  Substance and Sexual Activity   Alcohol use: No    Drug use: No   Sexual activity: Not Currently  Other Topics Concern   Not on file  Social History Narrative   Not on file   Social Determinants of Health   Financial Resource Strain: Medium Risk (03/09/2023)   Overall Financial Resource Strain (CARDIA)    Difficulty of Paying Living Expenses: Somewhat hard  Food Insecurity: No Food Insecurity (03/09/2023)   Hunger Vital Sign    Worried About Running Out of Food in the Last Year: Never true    Ran Out of Food in the Last Year: Never true  Transportation Needs: No Transportation Needs (03/09/2023)   PRAPARE - Transportation    Lack of Transportation (  Medical): No    Lack of Transportation (Non-Medical): No  Physical Activity: Insufficiently Active (03/09/2023)   Exercise Vital Sign    Days of Exercise per Week: 1 day    Minutes of Exercise per Session: 10 min  Stress: Stress Concern Present (03/09/2023)   [employer] of Occupational Health - Occupational Stress Questionnaire    Feeling of Stress : Very much  Social Connections: Socially Isolated (03/09/2023)   Social Connection and Isolation Panel [NHANES]    Frequency of Communication with Friends and Family: Once a week    Frequency of Social Gatherings with Friends and Family: Once a week    Attends Religious Services: Never    Database administrator or Organizations: No    Attends Engineer, structural: Never    Marital Status: Never married    Tobacco Counseling Counseling given: Yes   Clinical Intake:  Pre-visit preparation completed: Yes  Pain : 0-10 Pain Score: 8  Pain Type: Neuropathic pain Pain Location: Foot Pain Orientation: Right, Left Pain Descriptors / Indicators: Burning Pain Onset: More than a month ago Pain Frequency: Intermittent     BMI - recorded: 34.79 Nutritional Status: BMI > 30  Obese Nutritional Risks: None Diabetes: No  How often do you need to have someone help you when you read instructions, pamphlets, or other written  materials from your doctor or pharmacy?: 1 - Never  Interpreter Needed?: No  Information entered by ::  Eulia Hatcher, CMA   Activities of Daily Living    03/09/2023    9:16 AM  In your present state of health, do you have any difficulty performing the following activities:  Hearing? 0  Vision? 0  Difficulty concentrating or making decisions? 0  Walking or climbing stairs? 1  Comment due to Lupus  Dressing or bathing? 1  Comment due to Lupus  Doing errands, shopping? 1  Comment due to Lupus  Preparing Food and eating ? N  Using the Toilet? N  In the past six months, have you accidently leaked urine? N  Do you have problems with loss of bowel control? N  Managing your Medications? N  Managing your Finances? N  Housekeeping or managing your Housekeeping? Y  Comment pain associated with lupus    Patient Care Team: Ivonne Andrew, NP as PCP - General (Adult Health Nurse Practitioner) Nahser, Deloris Ping, MD as PCP - Cardiology (Cardiology) Glyn Ade, PA-C as Physician Assistant (Dermatology)  Indicate any recent Medical Services you may have received from other than [facility] providers in the past year (date may be approximate).     Assessment:   This is a routine wellness examination for [place].  Hearing/Vision screen Hearing Screening - Comments:: Patient denies any hearing difficulties.    Dietary issues and exercise activities discussed:     Goals Addressed               This Visit's Progress     Patient Stated (pt-stated)        Lose weight.       Depression Screen    03/09/2023    9:12 AM 01/23/2023    3:39 PM 10/07/2022    2:50 PM 02/21/2022   11:39 AM 02/21/2022   11:30 AM 01/06/2022    2:12 PM 09/16/2021    9:18 AM  PHQ 2/9 Scores  PHQ - 2 Score 3 4 2  0 0 0   PHQ- 9 Score 14  9 6  6  Information is confidential and restricted. Go to Review Flowsheets to unlock data.    Fall Risk    03/09/2023    9:12 AM 02/21/2022   11:29 AM 01/06/2022     2:12 PM 07/08/2021    3:06 PM 12/23/2020    9:05 AM  Fall Risk   Falls in the past year? 0 0 0 0 0  Number falls in past yr: 0 0 0 0 0  Injury with Fall? 0 0 0 0 0  Risk for fall due to : No Fall Risks No Fall Risks No Fall Risks  No Fall Risks  Follow up Falls prevention discussed Falls evaluation completed Falls evaluation completed      MEDICARE RISK AT HOME:  Medicare Risk at Home - 03/09/23 0906     Any stairs in or around the home? Yes    If so, are there any without handrails? No    Home free of loose throw rugs in walkways, pet beds, electrical cords, etc? Yes    Adequate lighting in your home to reduce risk of falls? Yes    Life alert? No    Use of a cane, walker or w/c? Yes    Grab bars in the bathroom? No    Shower chair or bench in shower? No    Elevated toilet seat or a handicapped toilet? No             TIMED UP AND GO:  Was the test performed?  No    Cognitive Function:        03/09/2023    9:08 AM  6CIT Screen  What Year? 0 points  What month? 0 points  What time? 0 points  Count back from 20 0 points  Months in reverse 0 points  Repeat phrase 0 points  Total Score 0 points    Immunizations Immunization History  Administered Date(s) Administered   Influenza,inj,Quad PF,6+ Mos 08/11/2015, 07/27/2018, 05/17/2019, 07/08/2021   Pneumococcal Conjugate-13 05/13/2016, 05/28/2021   Pneumococcal Polysaccharide-23 05/29/2019    TDAP status: Due, Education has been provided regarding the importance of this vaccine. Advised may receive this vaccine at local pharmacy or Health Dept. Aware to provide a copy of the vaccination record if obtained from local pharmacy or Health Dept. Verbalized acceptance and understanding.  Flu Vaccine status: Up to date  Pneumococcal vaccine status: NOT AGE APPROPRIATE FOR THIS PATIENT   Covid-19 vaccine status: Declined, Education has been provided regarding the importance of this vaccine but patient still declined.  Advised may receive this vaccine at local pharmacy or Health Dept.or vaccine clinic. Aware to provide a copy of the vaccination record if obtained from local pharmacy or Health Dept. Verbalized acceptance and understanding.  Qualifies for Shingles Vaccine? No age appropriate for this patient   Screening Tests Health Maintenance  Topic Date Due   COVID-19 Vaccine (1) Never done   Hepatitis C Screening  Never done   DTaP/Tdap/Td (1 - Tdap) Never done   PAP-Cervical Cytology Screening  02/15/2020   PAP SMEAR-Modifier  02/15/2020   Medicare Annual Wellness (AWV)  02/22/2023   INFLUENZA VACCINE  04/13/2023   HIV Screening  Completed   HPV VACCINES  Aged Out    Health Maintenance  Health Maintenance Due  Topic Date Due   COVID-19 Vaccine (1) Never done   Hepatitis C Screening  Never done   DTaP/Tdap/Td (1 - Tdap) Never done   PAP-Cervical Cytology Screening  02/15/2020   PAP SMEAR-Modifier  02/15/2020  Medicare Annual Wellness (AWV)  02/22/2023    Colorectal Cancer Screenings: Not age appropriate  Mammogram: NOT AGE APPROPRIATE FOR THIS PATIENT  Bone Density Screening: NOT AGE APPROPRIATE FOR THIS PATIENT   Lung Cancer Screening: (Low Dose CT Chest recommended if Age 57-80 years, 20 pack-year currently smoking OR have quit w/in 15years.) does not qualify.   Additional Screening:  Hepatitis C Screening: does qualify; Ordered 03/09/2023  Vision Screening: Recommended annual ophthalmology exams for early detection of glaucoma and other disorders of the eye. Is the patient up to date with their annual eye exam?  Yes  Who is the provider or what is the name of the office in which the patient attends annual eye exams?  If pt is not established with a provider, would they like to be referred to a provider to establish care? No .   Dental Screening: Recommended annual dental exams for proper oral hygiene  Diabetic Foot Exam: n/a  Community Resource Referral / Chronic Care  Management: CRR required this visit?  No   CCM required this visit?  No     Plan:     I have personally reviewed and noted the following in the patient's chart:   Medical and social history Use of alcohol, tobacco or illicit drugs  Current medications and supplements including opioid prescriptions. Patient is not currently taking opioid prescriptions. Functional ability and status Nutritional status Physical activity Advanced directives List of other physicians Hospitalizations, surgeries, and ER visits in previous 12 months Vitals Screenings to include cognitive, depression, and falls Referrals and appointments  In addition, I have reviewed and discussed with patient certain preventive protocols, quality metrics, and best practice recommendations. A written personalized care plan for preventive services as well as general preventive health recommendations were provided to patient.   Any medications not marked as taking were not mentioned by the patient (or their caregiver if applicable) when reconciling the medications.  Because this visit was a virtual/telehealth visit,  certain criteria was not obtained, such a blood pressure, CBG if patient is a diabetic, and timed up and go.   Jordan Hawks Alverna Fawley, CMA   03/09/2023   After Visit Summary: (MyChart) Due to this being a telephonic visit, the after visit summary with patients personalized plan was offered to patient via MyChart   Nurse Notes: Hep C Screening ordered today.

## 2023-03-10 ENCOUNTER — Other Ambulatory Visit: Payer: Self-pay

## 2023-03-10 ENCOUNTER — Encounter (HOSPITAL_COMMUNITY): Payer: Self-pay

## 2023-03-10 ENCOUNTER — Emergency Department (HOSPITAL_COMMUNITY)
Admission: EM | Admit: 2023-03-10 | Discharge: 2023-03-10 | Disposition: A | Discharge status: Home / Self Care | Payer: 59 | Attending: Emergency Medicine | Admitting: Emergency Medicine

## 2023-03-10 DIAGNOSIS — Z7901 Long term (current) use of anticoagulants: Secondary | ICD-10-CM | POA: Diagnosis not present

## 2023-03-10 DIAGNOSIS — R21 Rash and other nonspecific skin eruption: Secondary | ICD-10-CM | POA: Insufficient documentation

## 2023-03-10 DIAGNOSIS — Z91040 Latex allergy status: Secondary | ICD-10-CM | POA: Insufficient documentation

## 2023-03-10 MED ORDER — PREDNISONE 20 MG PO TABS: ORAL_TABLET | ORAL | 0 refills | Status: DC

## 2023-03-10 NOTE — ED Triage Notes (Signed)
Pt reports rash on RUE. States it appeared this morning. Patient states she has not changed any of her body products and she did not eat anything different. States rash is itching, not painful. Lupus history. No other symptoms

## 2023-03-10 NOTE — Discharge Instructions (Signed)
You have been evaluated for your rash.  It does not appear to be a skin infection.  This is likely to be a localized skin irritation.  You may use over-the-counter Benadryl cream or hydrocortisone cream and apply to the rash several times daily as needed for itchiness.  If your rash progressed and spread throughout the body if you have concern, take the prednisone that I have prescribed and follow-up with your doctor for further care.

## 2023-03-10 NOTE — ED Provider Notes (Signed)
EMERGENCY DEPARTMENT AT [facility] Provider Note   CSN: [id number] Arrival date & time: 03/10/23  1423     History  Chief Complaint  Patient presents with   Rash    On RUE    [name] [name] is a 27 y.o. female.  The history is provided by the patient and medical records. No language interpreter was used.  Rash    27 year old female significant history of lupus presenting with concerns of a rash.  Patient noticed an itchy rash that appears to her right upper arm early this morning.  Rash is nontender there is no associated fever or chills no headache no change in her environment no new soap detergent or medication.  She has history of lupus but normally does not develop rash.  She is unsure what caused this symptom.  She denies any specific treatment tried.  While waiting in the waiting room she felt her rash did improve but upon walking outside in the sun the rash returns.  No joint pain.  Home Medications Prior to Admission medications   Medication Sig Start Date End Date Taking? Authorizing Provider  bacitracin ointment Apply 1 Application topically 2 (two) times daily. Patient not taking: Reported on 01/23/2023 04/26/22   Army Melia A, PA-C  DULoxetine (CYMBALTA) 30 MG capsule Take 1 capsule (30 mg total) by mouth 2 (two) times daily. 09/16/21   Shanna Cisco, NP  etonogestrel (NEXPLANON) 68 MG IMPL implant 68 mg by Subdermal route continuous. 06/25/20 06/13/23  [provider]  famotidine (PEPCID) 20 MG tablet Take 20 mg by mouth daily.    [provider]  fluconazole (DIFLUCAN) 150 MG tablet Take 1 tablet (150 mg total) by mouth daily. May repeat after 3 days if needed Patient not taking: Reported on 01/23/2023 04/07/22   Ivonne Andrew, NP  fluconazole (DIFLUCAN) 200 MG tablet Take 200 mg by mouth. Patient not taking: Reported on 01/23/2023 12/16/21   [provider]  folic acid (FOLVITE) 1 MG tablet Take 1 tablet (1 mg  total) by mouth at bedtime. 03/25/19   Darlin Drop, DO  hydroxychloroquine (PLAQUENIL) 200 MG tablet Take 100-200 mg by mouth See admin instructions. Takes 1 tablet in the morning and 1/2 tablet at night.    [provider]  losartan (COZAAR) 25 MG tablet Take 25 mg by mouth daily. Take once daily at  bedtime    [provider]  magnesium (MAGTAB) 84 MG ( ) TBCR SR tablet Take 84 mg by mouth daily. 11/23/21   [provider]  mirtazapine (REMERON) 15 MG tablet TAKE 1 TABLET (15 MG TOTAL) BY MOUTH DAILY. 11/09/22   Ivonne Andrew, NP  mycophenolate (CELLCEPT) 500 MG tablet Take 1,500 mg by mouth 2 (two) times daily. 01/22/21   [provider]  PERIDEX 0.12 % solution 15 mLs 2 (two) times daily. 11/10/21   [provider]  rivaroxaban (XARELTO) 10 MG TABS tablet Take 1 tablet (10 mg total) by mouth daily with supper. 05/23/22   Erenest Blank, NP  Suvorexant (BELSOMRA) 5 MG TABS Take 5 mg by mouth at bedtime as needed. 09/16/21   Shanna Cisco, NP  tacrolimus (PROGRAF) 1 MG capsule Take by mouth. 07/02/21   [provider]  triamcinolone cream (KENALOG) 0.1 % Apply to AA daily and follow up in 30 days 02/16/22   Glyn Ade, PA-C  VITAMIN D PO Take by mouth.    [provider]  Vitamin D, Ergocalciferol, (DRISDOL) 50000 units CAPS capsule Take 1 capsule (50,000 Units total) by mouth 2 (two) times a week. Patient not taking: Reported on 01/23/2023 12/21/17   [name], MD      Allergies    Lisinopril and Latex    Review of Systems   Review of Systems  Skin:  Positive for rash.  All other systems reviewed and are negative.   Physical Exam Updated Vital Signs BP (!) 140/99 (BP Location: Right Arm)   Pulse 96   Temp 99.8 F (37.7 C) (Oral)   Resp 16   Ht 5\' 2"  (1.575 m)   Wt 86 kg   SpO2 100%   BMI 34.68 kg/m  Physical Exam Vitals and nursing note reviewed.  Constitutional:      General: She is not in  acute distress.    Appearance: She is well-developed.  HENT:     Head: Atraumatic.  Eyes:     Conjunctiva/sclera: Conjunctivae normal.  Pulmonary:     Effort: Pulmonary effort is normal.  Musculoskeletal:     Cervical back: Neck supple.  Skin:    Findings: Rash (Right upper extremity: There is a cluster of skin color papular raised skin noted to the upper arm without any erythema edema or warmth.  It is measuring 2 x 4 cm) present.  Neurological:     Mental Status: She is alert.  Psychiatric:        Mood and Affect: Mood normal.     ED Results / Procedures / Treatments   Labs (all labs ordered are listed, but only abnormal results are displayed) Labs Reviewed - No data to display  EKG None  Radiology No results found.  Procedures Procedures    Medications Ordered in ED Medications - No data to display  ED Course/ Medical Decision Making/ A&P                             Medical Decision Making  BP (!) 140/99 (BP Location: Right Arm)   Pulse 96   Temp 99.8 F (37.7 C) (Oral)   Resp 16   Ht 5\' 2"  (1.575 m)   Wt 86 kg   SpO2 100%   BMI 34.68 kg/m   42:6 PM 27 year old female significant history of lupus presenting with concerns of a rash.  Patient noticed an itchy rash that appears to her right upper arm early this morning.  Rash is nontender there is no associated fever or chills no headache no change in her environment no new soap detergent or medication.  She has history of lupus but normally does not develop rash.  She is unsure what caused this symptom.  She denies any specific treatment tried.  While waiting in the waiting room she felt her rash did improve but upon walking outside in the sun the rash returns.  No joint pain.  On exam this is a well-appearing female resting comfortably in the chair appears to be in no acute discomfort.  Patient has a skin color maculopapular rash noted to her right upper extremities measuring approximately 2 x 4 cm without  any tenderness to palpation.  No petechial, vesicular, or pustular rash.  No lymphangitis.  Suspect this is likely to be a localized skin irritation likely allergic in nature.  Doubt lupus rash or other concerning systemic rash.  I recommend Benadryl cream or hydrocortisone cream as needed but will prescribe a course of prednisone burst  and taper in the event that her rash progressed.  Patient voiced understanding and agrees to plan.  Return precaution given.        Final Clinical Impression(s) / ED Diagnoses Final diagnoses:  Rash and nonspecific skin eruption    Rx / DC Orders ED Discharge Orders          Ordered    predniSONE (DELTASONE) 20 MG tablet        03/10/23 1724              Fayrene Helper, PA-C 03/10/23 1725    Gwyneth Sprout, MD 03/10/23 [phone]

## 2023-03-13 ENCOUNTER — Telehealth: Payer: Self-pay

## 2023-03-13 NOTE — Transitions of Care (Post Inpatient/ED Visit) (Cosign Needed)
03/13/2023  Name: [name] [name] MRN: [id number] DOB: 1996-03-11  Today's TOC FU Call Status: Today's TOC FU Call Status:: Successful TOC FU Call Competed TOC FU Call Complete Date: 03/13/23  Transition Care Management Follow-up Telephone Call Date of Discharge: 03/10/23 Discharge Facility: [facility]) Type of Discharge: Emergency Department Reason for ED Visit: Other: (rash) How have you been since you were released from the hospital?: Better Any questions or concerns?: No  Items Reviewed: Did you receive and understand the discharge instructions provided?: No Any new allergies since your discharge?: No Dietary orders reviewed?: NA Do you have support at home?: Yes People in Home: other relative(s)  Medications Reviewed Today: Medications Reviewed Today     Reviewed by Raelyn Number, CMA (Certified Medical Assistant) on 03/09/23 at 0911  Med List Status: <None>   Medication Order Taking? Sig Documenting Provider Last Dose Status Informant  bacitracin ointment [id number] No Apply 1 Application topically 2 (two) times daily.  Patient not taking: Reported on 01/23/2023   Jeannie Fend, PA-C Not Taking Active   DULoxetine (CYMBALTA) 30 MG capsule [id number]  Take 1 capsule (30 mg total) by mouth 2 (two) times daily. Shanna Cisco, NP  Active   etonogestrel (NEXPLANON) 68 MG IMPL implant [id number]  68 mg by Subdermal route continuous. [provider]  Active Self           Med Note Windell Hummingbird   Tue May 18, 2021  9:05 PM) Year 2021   famotidine (PEPCID) 20 MG tablet [id number] Yes Take 20 mg by mouth daily. [provider] Taking Active Self  fluconazole (DIFLUCAN) 150 MG tablet [id number]  Take 1 tablet (150 mg total) by mouth daily. May repeat after 3 days if needed  Patient not taking: Reported on 01/23/2023   Ivonne Andrew, NP  Active   fluconazole (DIFLUCAN) 200 MG tablet [id number]  Take 200 mg by mouth.  Patient not taking: Reported  on 01/23/2023   [provider]  Active   folic acid (FOLVITE) 1 MG tablet [id number]  Take 1 tablet (1 mg total) by mouth at bedtime. Darlin Drop, DO  Active   hydroxychloroquine (PLAQUENIL) 200 MG tablet [id number] Yes Take 100-200 mg by mouth See admin instructions. Takes 1 tablet in the morning and 1/2 tablet at night. [provider] Taking Active Self  losartan (COZAAR) 25 MG tablet [id number]  Take 25 mg by mouth daily. Take once daily at  bedtime [provider]  Active Self  magnesium (MAGTAB) 84 MG ( ) TBCR SR tablet [id number]  Take 84 mg by mouth daily. [provider]  Active   mirtazapine (REMERON) 15 MG tablet [id number]  TAKE 1 TABLET (15 MG TOTAL) BY MOUTH DAILY. Ivonne Andrew, NP  Active   mycophenolate (CELLCEPT) 500 MG tablet [id number] Yes Take 1,500 mg by mouth 2 (two) times daily. [provider] Taking Active Self  PERIDEX 0.12 % solution [id number]  15 mLs 2 (two) times daily. [provider]  Active   rivaroxaban (XARELTO) 10 MG TABS tablet [id number]  Take 1 tablet (10 mg total) by mouth daily with supper. Erenest Blank, NP  Active   Suvorexant [facility]) 5 MG TABS [id number]  Take 5 mg by mouth at bedtime as needed. Shanna Cisco, NP  Active   tacrolimus (PROGRAF) 1 MG capsule [id number] Yes Take by mouth. [provider] Taking Active   triamcinolone cream (KENALOG) 0.1 % [id number]  Apply to AA  daily and follow up in 30 days Suzi Roots  Active   VITAMIN D PO [id number]  Take by mouth. [provider]  Active   Vitamin D, Ergocalciferol, (DRISDOL) 50000 units CAPS capsule [id number]  Take 1 capsule (50,000 Units total) by mouth 2 (two) times a week.  Patient not taking: Reported on 01/23/2023   [name], MD  Active            Med Note Aurther Loft, ASHELEY G   Tue May 28, 2019  1:47 PM)              Home Care and Equipment/Supplies: Were Home Health Services Ordered?:  NA Any new equipment or medical supplies ordered?: NA  Functional Questionnaire: Do you need assistance with bathing/showering or dressing?: No Do you need assistance with meal preparation?: No Do you need assistance with eating?: No Do you have difficulty maintaining continence: No Do you need assistance with getting out of bed/getting out of a chair/moving?: No Do you have difficulty managing or taking your medications?: No  Follow up appointments reviewed: PCP Follow-up appointment confirmed?: Yes Date of PCP follow-up appointment?: 03/31/23 Follow-up Provider: [facility] Follow-up appointment confirmed?: NA Do you need transportation to your follow-up appointment?: No Do you understand care options if your condition(s) worsen?: Yes-patient verbalized understanding    SIGNATURE.  Renelda Loma RMA

## 2023-03-31 ENCOUNTER — Encounter: Payer: Self-pay | Admitting: Nurse Practitioner

## 2023-03-31 ENCOUNTER — Ambulatory Visit (INDEPENDENT_AMBULATORY_CARE_PROVIDER_SITE_OTHER): Payer: 59 | Admitting: Nurse Practitioner

## 2023-03-31 VITALS — BP 125/86 | HR 85 | Temp 97.2°F | Wt 193.2 lb

## 2023-03-31 DIAGNOSIS — L0231 Cutaneous abscess of buttock: Secondary | ICD-10-CM | POA: Diagnosis not present

## 2023-03-31 MED ORDER — AMOXICILLIN-POT CLAVULANATE 875-125 MG PO TABS
1.0000 | ORAL_TABLET | Freq: Two times a day (BID) | ORAL | 0 refills | Status: AC
Start: 2023-03-31 — End: 2023-04-07

## 2023-03-31 MED ORDER — MUPIROCIN 2 % EX OINT
1.0000 | TOPICAL_OINTMENT | Freq: Two times a day (BID) | CUTANEOUS | 0 refills | Status: AC
Start: 2023-03-31 — End: ?

## 2023-03-31 NOTE — Assessment & Plan Note (Signed)
-   amoxicillin-clavulanate (AUGMENTIN) 875-125 MG tablet; Take 1 tablet by mouth 2 (two) times daily for 7 days.  Dispense: 14 tablet; Refill: 0   Follow up:  Follow up in 3 months

## 2023-03-31 NOTE — Progress Notes (Signed)
@Patient  ID: [name] [name], female    DOB: 19-Mar-1996, 27 y.o.   MRN: [id number]  Chief Complaint  Patient presents with   Hospitalization Follow-up    Referring provider: Ivonne Andrew, NP   HPI  Patient presents today for a hospital follow up. She was seen in the ED on 03/10/23 for rash and was treated with prednisone. The rash has cleared, but she has developed a boil on her buttocks. She states that it did open the other day and drained. We will trial Bactroban ointment and keflex. Denies f/c/s, n/v/d, hemoptysis, PND, leg swelling Denies chest pain or edema        Allergies  Allergen Reactions   Lisinopril Other (See Comments)     Angioedema    Latex Rash    Immunization History  Administered Date(s) Administered   Influenza,inj,Quad PF,6+ Mos 08/11/2015, 07/27/2018, 05/17/2019, 07/08/2021   Pneumococcal Conjugate-13 05/13/2016, 05/28/2021   Pneumococcal Polysaccharide-23 05/29/2019    Past Medical History:  Diagnosis Date   Accidental methotrexate overdose 01/08/2018   Acute lower UTI    Asthma    Bronchitis 03/19/2019   Cardiomegaly 05/08/2019   Chronic midline low back pain without sciatica 11/09/2016   Facet joint arthropathy   Dehydration 03/19/2019   Depo-Provera contraceptive status 08/11/2015   Depression 02/08/2017   Dysphagia 06/01/2018   Dysuria 04/19/2019   Elevated AST (SGOT) 03/19/2019   Essential hypertension 03/19/2019   Facial edema    Gout    Hypertension    IDA (iron deficiency anemia) 08/11/2015   Immunosuppressed status (HCC)    Insomnia 01/29/2016   Leukopenia 01/10/2018   Lobar pneumonia (HCC) 06/07/2018   Lupus (HCC)    Lupus (systemic lupus erythematosus) (HCC) 03/19/2019   Metabolic acidosis 03/19/2019   Methotrexate toxicity    Mouth ulcers    Mucositis oral 01/12/2018   Odynophagia 06/01/2018   Oral thrush 03/19/2019   Pleuritic chest pain 06/05/2018   Primary osteoarthritis of both knees 11/09/2016   Rash due  to systemic lupus erythematosus (SLE) (HCC)    Raynaud disease    Raynaud's disease 08/11/2015   Relative acute adrenal insufficiency (HCC) 06/11/2018   Right leg DVT (HCC) 05/08/2019   Seasonal allergies 08/11/2015   Sepsis (HCC) 05/08/2019   Sinus tachycardia 03/19/2019   SLE (systemic lupus erythematosus) (HCC) 09/19/2016     Positive ANA, double-stranded DNA, Ro, RNP, RF, history of nasal ulcers, fatigue, anemia, arthritis, Raynauds   Thrush 06/01/2018   Vaginal candidiasis 01/12/2018   Vitamin D deficiency 11/09/2016    Tobacco History: Social History   Tobacco Use  Smoking Status Never  Smokeless Tobacco Never   Counseling given: Not Answered   Outpatient Encounter Medications as of 03/31/2023  Medication Sig   amoxicillin-clavulanate (AUGMENTIN) 875-125 MG tablet Take 1 tablet by mouth 2 (two) times daily for 7 days.   DULoxetine (CYMBALTA) 30 MG capsule Take 1 capsule (30 mg total) by mouth 2 (two) times daily.   etonogestrel (NEXPLANON) 68 MG IMPL implant 68 mg by Subdermal route continuous.   famotidine (PEPCID) 20 MG tablet Take 20 mg by mouth daily.   folic acid (FOLVITE) 1 MG tablet Take 1 tablet (1 mg total) by mouth at bedtime.   hydroxychloroquine (PLAQUENIL) 200 MG tablet Take 100-200 mg by mouth See admin instructions. Takes 1 tablet in the morning and 1/2 tablet at night.   losartan (COZAAR) 25 MG tablet Take 25 mg by mouth daily. Take once daily at  bedtime  magnesium (MAGTAB) 84 MG ( ) TBCR SR tablet Take 84 mg by mouth daily.   mirtazapine (REMERON) 15 MG tablet TAKE 1 TABLET (15 MG TOTAL) BY MOUTH DAILY.   mupirocin ointment (BACTROBAN) 2 % Apply 1 Application topically 2 (two) times daily.   mycophenolate (CELLCEPT) 500 MG tablet Take 1,500 mg by mouth 2 (two) times daily.   PERIDEX 0.12 % solution 15 mLs 2 (two) times daily.   predniSONE (DELTASONE) 20 MG tablet 3 tabs po day one, then 2 tabs daily x 4 days   rivaroxaban (XARELTO) 10 MG TABS tablet  Take 1 tablet (10 mg total) by mouth daily with supper.   Suvorexant (BELSOMRA) 5 MG TABS Take 5 mg by mouth at bedtime as needed.   tacrolimus (PROGRAF) 1 MG capsule Take by mouth.   triamcinolone cream (KENALOG) 0.1 % Apply to AA daily and follow up in 30 days   Vitamin D, Ergocalciferol, (DRISDOL) 50000 units CAPS capsule Take 1 capsule (50,000 Units total) by mouth 2 (two) times a week.   fluconazole (DIFLUCAN) 150 MG tablet Take 1 tablet (150 mg total) by mouth daily. May repeat after 3 days if needed (Patient not taking: Reported on 01/23/2023)   fluconazole (DIFLUCAN) 200 MG tablet Take 200 mg by mouth. (Patient not taking: Reported on 01/23/2023)   VITAMIN D PO Take by mouth. (Patient not taking: Reported on 03/13/2023)   No facility-administered encounter medications on file as of 03/31/2023.     Review of Systems  Review of Systems  Constitutional: Negative.   HENT: Negative.    Cardiovascular: Negative.   Gastrointestinal: Negative.   Skin:        Abscess to buttock  Allergic/Immunologic: Negative.   Neurological: Negative.   Psychiatric/Behavioral: Negative.         Physical Exam  BP 125/86   Pulse 85   Temp (!) 97.2 F (36.2 C)   Wt 193 lb 3.2 oz (87.6 kg)   SpO2 100%   BMI 35.34 kg/m   Wt Readings from Last 5 Encounters:  03/31/23 193 lb 3.2 oz (87.6 kg)  03/10/23 189 lb 9.5 oz (86 kg)  03/09/23 190 lb 3.2 oz (86.3 kg)  01/23/23 187 lb 6.4 oz (85 kg)  11/21/22 186 lb (84.4 kg)     Physical Exam Vitals and nursing note reviewed.  Constitutional:      General: She is not in acute distress.    Appearance: She is well-developed.  Cardiovascular:     Rate and Rhythm: Normal rate and regular rhythm.  Pulmonary:     Effort: Pulmonary effort is normal.     Breath sounds: Normal breath sounds.  Skin:    Comments: Abscess noted to left buttock.   Neurological:     Mental Status: She is alert and oriented to person, place, and time.      Lab  Results:  CBC    Component Value Date/Time   WBC 6.8 11/21/2022 1247   WBC 8.0 01/04/2022 1928   RBC 3.75 (L) 11/21/2022 1248   RBC 3.76 (L) 11/21/2022 1247   HGB 10.3 (L) 11/21/2022 1247   HGB 9.2 (L) 12/03/2019 1449   HCT 31.9 (L) 11/21/2022 1247   HCT 28.6 (L) 12/03/2019 1449   PLT 337 11/21/2022 1247   PLT 174 12/03/2019 1449   MCV 84.8 11/21/2022 1247   MCV 81 12/03/2019 1449   MCH 27.4 11/21/2022 1247   MCHC 32.3 11/21/2022 1247   RDW 13.4 11/21/2022 1247  RDW 16.2 (H) 12/03/2019 1449   LYMPHSABS 2.1 11/21/2022 1247   LYMPHSABS 0.5 (L) 03/28/2018 1042   MONOABS 0.4 11/21/2022 1247   EOSABS 0.2 11/21/2022 1247   EOSABS 0.0 03/28/2018 1042   BASOSABS 0.0 11/21/2022 1247   BASOSABS 0.0 03/28/2018 1042    BMET    Component Value Date/Time   NA 136 11/21/2022 1247   NA 140 12/03/2019 1449   K 4.2 11/21/2022 1247   CL 105 11/21/2022 1247   CO2 23 11/21/2022 1247   GLUCOSE 75 11/21/2022 1247   BUN 20 11/21/2022 1247   BUN 11 12/03/2019 1449   CREATININE 1.58 (H) 11/21/2022 1247   CREATININE 0.57 12/11/2017 1018   CALCIUM 8.8 (L) 11/21/2022 1247   GFRNONAA 45 (L) 11/21/2022 1247   GFRNONAA 131 12/11/2017 1018   GFRAA >60 05/06/2020 1407   GFRAA 151 12/11/2017 1018    BNP    Component Value Date/Time   BNP 32.9 02/12/2021 1224     Assessment & Plan:   Cutaneous abscess of buttock - amoxicillin-clavulanate (AUGMENTIN) 875-125 MG tablet; Take 1 tablet by mouth 2 (two) times daily for 7 days.  Dispense: 14 tablet; Refill: 0   Follow up:  Follow up in 3 months     Ivonne Andrew, NP 03/31/2023

## 2023-03-31 NOTE — Patient Instructions (Signed)
1. Cutaneous abscess of buttock  - amoxicillin-clavulanate (AUGMENTIN) 875-125 MG tablet; Take 1 tablet by mouth 2 (two) times daily for 7 days.  Dispense: 14 tablet; Refill: 0   Follow up:  Follow up in 3 months

## 2023-05-02 ENCOUNTER — Emergency Department (HOSPITAL_COMMUNITY): Payer: 59

## 2023-05-02 ENCOUNTER — Other Ambulatory Visit: Payer: Self-pay

## 2023-05-02 ENCOUNTER — Emergency Department (HOSPITAL_COMMUNITY)
Admission: EM | Admit: 2023-05-02 | Discharge: 2023-05-02 | Disposition: A | Discharge status: Home / Self Care | Payer: 59 | Attending: Emergency Medicine | Admitting: Emergency Medicine

## 2023-05-02 DIAGNOSIS — I10 Essential (primary) hypertension: Secondary | ICD-10-CM | POA: Insufficient documentation

## 2023-05-02 DIAGNOSIS — Z79899 Other long term (current) drug therapy: Secondary | ICD-10-CM | POA: Diagnosis not present

## 2023-05-02 DIAGNOSIS — U071 COVID-19: Secondary | ICD-10-CM | POA: Insufficient documentation

## 2023-05-02 DIAGNOSIS — L539 Erythematous condition, unspecified: Secondary | ICD-10-CM | POA: Diagnosis not present

## 2023-05-02 DIAGNOSIS — Z91040 Latex allergy status: Secondary | ICD-10-CM | POA: Diagnosis not present

## 2023-05-02 DIAGNOSIS — Z7901 Long term (current) use of anticoagulants: Secondary | ICD-10-CM | POA: Insufficient documentation

## 2023-05-02 DIAGNOSIS — R059 Cough, unspecified: Secondary | ICD-10-CM | POA: Diagnosis present

## 2023-05-02 DIAGNOSIS — R7401 Elevation of levels of liver transaminase levels: Secondary | ICD-10-CM | POA: Diagnosis not present

## 2023-05-02 DIAGNOSIS — R930 Abnormal findings on diagnostic imaging of skull and head, not elsewhere classified: Secondary | ICD-10-CM | POA: Insufficient documentation

## 2023-05-02 LAB — RESP PANEL BY RT-PCR (RSV, FLU A&B, COVID)  RVPGX2
Influenza A by PCR: NEGATIVE
Influenza B by PCR: NEGATIVE
Resp Syncytial Virus by PCR: NEGATIVE
SARS Coronavirus 2 by RT PCR: POSITIVE — AB

## 2023-05-02 NOTE — ED Notes (Signed)
Patient was able to tolerate ambulation well. SPO2 remained at 100%.

## 2023-05-02 NOTE — ED Provider Notes (Signed)
[name] [name] AT [facility] Provider Note   CSN: [id number] Arrival date & time: 05/02/23  1254     History  Chief Complaint  Patient presents with   Cough    [name] [name] is a 27 y.o. female with extensive medical history to include systemic lupus erythematous, facial edema, gout, hypertension, mouth ulcers, elevated AST.  Patient presents to ED for evaluation of URI symptoms, congestion.  Patient reports that since Sunday, 3 days, she has had increased congestion, body aches and chills, nausea and vomiting, sore throat.  She states that she has not been around anyone sick that she knows of, she states she does not work currently as she is on disability.  She denies fevers at home, diarrhea, abdominal pain, chest pain.  She reports that she does have shortness of breath but attributes this to her not being able to breathe her nose.  States she has been trying to use herbal remedies at home to include hot teas.  States that she had some facial swelling yesterday however reports that she has a history of facial edema.  States that she believe she has a sinus infection, denies history of the same. Patient is also endorsing right arm numbness and right sided facial numbness that he states began this morning upon waking also. Denies any lower extremity numbness or weakness.    Cough Associated symptoms: shortness of breath   Associated symptoms: no chest pain        Home Medications Prior to Admission medications   Medication Sig Start Date End Date Taking? Authorizing Provider  DULoxetine (CYMBALTA) 30 MG capsule Take 1 capsule (30 mg total) by mouth 2 (two) times daily. 09/16/21   Shanna Cisco, NP  etonogestrel (NEXPLANON) 68 MG IMPL implant 68 mg by Subdermal route continuous. 06/25/20 06/13/23  [provider]  famotidine (PEPCID) 20 MG tablet Take 20 mg by mouth daily.    [provider]  fluconazole (DIFLUCAN) 150 MG tablet Take 1  tablet (150 mg total) by mouth daily. May repeat after 3 days if needed Patient not taking: Reported on 01/23/2023 04/07/22   Ivonne Andrew, NP  fluconazole (DIFLUCAN) 200 MG tablet Take 200 mg by mouth. Patient not taking: Reported on 01/23/2023 12/16/21   [provider]  folic acid (FOLVITE) 1 MG tablet Take 1 tablet (1 mg total) by mouth at bedtime. 03/25/19   Darlin Drop, DO  hydroxychloroquine (PLAQUENIL) 200 MG tablet Take 100-200 mg by mouth See admin instructions. Takes 1 tablet in the morning and 1/2 tablet at night.    [provider]  losartan (COZAAR) 25 MG tablet Take 25 mg by mouth daily. Take once daily at  bedtime    [provider]  magnesium (MAGTAB) 84 MG ( ) TBCR SR tablet Take 84 mg by mouth daily. 11/23/21   [provider]  mirtazapine (REMERON) 15 MG tablet TAKE 1 TABLET (15 MG TOTAL) BY MOUTH DAILY. 11/09/22   Ivonne Andrew, NP  mupirocin ointment (BACTROBAN) 2 % Apply 1 Application topically 2 (two) times daily. 03/31/23   Ivonne Andrew, NP  mycophenolate (CELLCEPT) 500 MG tablet Take 1,500 mg by mouth 2 (two) times daily. 01/22/21   [provider]  PERIDEX 0.12 % solution 15 mLs 2 (two) times daily. 11/10/21   [provider]  predniSONE (DELTASONE) 20 MG tablet 3 tabs po day one, then 2 tabs daily x 4 days 03/10/23   Fayrene Helper,  PA-C  rivaroxaban (XARELTO) 10 MG TABS tablet Take 1 tablet (10 mg total) by mouth daily with supper. 05/23/22   Erenest Blank, NP  Suvorexant (BELSOMRA) 5 MG TABS Take 5 mg by mouth at bedtime as needed. 09/16/21   Shanna Cisco, NP  tacrolimus (PROGRAF) 1 MG capsule Take by mouth. 07/02/21   [provider]  triamcinolone cream (KENALOG) 0.1 % Apply to AA daily and follow up in 30 days 02/16/22   Glyn Ade, PA-C  VITAMIN D PO Take by mouth. Patient not taking: Reported on 03/13/2023    [provider]  Vitamin D, Ergocalciferol, (DRISDOL) 50000 units CAPS  capsule Take 1 capsule (50,000 Units total) by mouth 2 (two) times a week. 12/21/17   [name], MD      Allergies    Lisinopril and Latex    Review of Systems   Review of Systems  Respiratory:  Positive for cough and shortness of breath.   Cardiovascular:  Negative for chest pain.  All other systems reviewed and are negative.   Physical Exam Updated Vital Signs BP (!) 151/111   Pulse 74   Temp 98.2 F (36.8 C) (Oral)   Resp 17   Ht 5\' 2"  (1.575 m)   Wt 87 kg   SpO2 100%   BMI 35.08 kg/m  Physical Exam Vitals and nursing note reviewed.  Constitutional:      General: She is not in acute distress.    Appearance: Normal appearance. She is not ill-appearing, toxic-appearing or diaphoretic.  HENT:     Head: Normocephalic and atraumatic.     Nose: Nose normal.     Mouth/Throat:     Mouth: Mucous membranes are moist.     Pharynx: Oropharynx is clear. Posterior oropharyngeal erythema present. No oropharyngeal exudate.     Comments: Uvula midline, handling secretions appropriately Eyes:     Extraocular Movements: Extraocular movements intact.     Conjunctiva/sclera: Conjunctivae normal.     Pupils: Pupils are equal, round, and reactive to light.  Cardiovascular:     Rate and Rhythm: Normal rate and regular rhythm.  Pulmonary:     Effort: Pulmonary effort is normal.     Breath sounds: Normal breath sounds. No wheezing.     Comments: CTAB Abdominal:     General: Abdomen is flat. Bowel sounds are normal.     Palpations: Abdomen is soft.     Tenderness: There is no abdominal tenderness.  Musculoskeletal:     Cervical back: Normal range of motion and neck supple. No tenderness.  Skin:    General: Skin is warm and dry.     Capillary Refill: Capillary refill takes less than 2 seconds.  Neurological:     Mental Status: She is alert and oriented to person, place, and time.     ED Results / Procedures / Treatments   Labs (all labs ordered are listed, but only  abnormal results are displayed) Labs Reviewed  RESP PANEL BY RT-PCR (RSV, FLU A&B, COVID)  [id number] - Abnormal; Notable for the following components:      Result Value   SARS Coronavirus 2 by RT PCR POSITIVE (*)    All other components within normal limits    EKG None  Radiology CT Head Wo Contrast  Result Date: 05/02/2023 CLINICAL DATA:  Mental status change, unknown cause EXAM: CT HEAD WITHOUT CONTRAST TECHNIQUE: Contiguous axial images were obtained from the base of the skull through the vertex without intravenous contrast.  RADIATION DOSE REDUCTION: This exam was performed according to the departmental dose-optimization program which includes automated exposure control, adjustment of the mA and/or kV according to patient size and/or use of iterative reconstruction technique. COMPARISON:  CT head 10/23/2013 FINDINGS: Brain: No evidence of large-territorial acute infarction. No parenchymal hemorrhage. No mass lesion. No extra-axial collection. No mass effect or midline shift. No hydrocephalus. Basilar cisterns are patent. Vascular: No hyperdense vessel. Skull: No acute fracture or focal lesion. Sinuses/Orbits: Bilateral ethmoid and sphenoid sinus mucosal thickening. Paranasal sinuses and mastoid air cells are clear. The orbits are unremarkable. Other: None. IMPRESSION: No acute intracranial abnormality. Electronically Signed   By: Tish Frederickson M.D.   On: 05/02/2023 17:24   DG Chest 2 View  Result Date: 05/02/2023 CLINICAL DATA:  Cough, shortness of breath, upper respiratory infection. EXAM: CHEST - 2 VIEW COMPARISON:  Radiograph and CT 02/12/2021 FINDINGS: The heart is normal in size. Mediastinal contours are normal. There is chronic blunting of right costophrenic angle. No acute airspace disease. No pulmonary edema, pleural effusion or pneumothorax. No acute osseous abnormalities are seen. IMPRESSION: No acute chest findings. Chronic blunting of right costophrenic angle. Electronically Signed    By: Narda Rutherford M.D.   On: 05/02/2023 17:19    Procedures Procedures   Medications Ordered in ED Medications - No data to display  ED Course/ Medical Decision Making/ A&P    Medical Decision Making Amount and/or Complexity of Data Reviewed Radiology: ordered.   27 year old female presents to ED for evaluation.  Please see HPI for further details.  On examination the patient is afebrile and nontachycardic.  Lung sounds are clear bilaterally, she is nonhypoxic on room air.  Abdomen soft and compressible throughout.  Posterior oropharynx has erythema without exudate.  Uvula midline.  Handling secretions appropriately.  No drooling, no change in phonation.  Viral panel is positive for COVID-19.  Chest x-ray is unremarkable.  CT head unremarkable.  Patient ambulated and remained at 100% oxygen saturation on room air.  At this time patient we discharged home.  She will be advised to follow-up with her PCP.  She was encouraged to return to the ED with any new or worsening signs or symptoms such as lightheadedness, dizziness, weakness.  Advised to treat symptoms conservatively at home with Tylenol.  Encouraged to hydrate.  Patient stable to discharge home.   Final Clinical Impression(s) / ED Diagnoses Final diagnoses:  COVID-19    Rx / DC Orders ED Discharge Orders     None         Al Decant, PA-C 05/02/23 1842    Gloris Manchester, MD 05/02/23 [phone]

## 2023-05-02 NOTE — ED Triage Notes (Signed)
Pt c/o cough, congestion and body aches x few days.

## 2023-05-02 NOTE — Discharge Instructions (Signed)
It was a pleasure taking part in your care today.  As we discussed, you have COVID-19.  Please continue treating her symptoms at home conservatively with Tylenol, 650 mg for headaches and fevers.  You may also purchase Zicam.  Please eat high-protein low-fat diet.  Please push fluids to include Pedialyte.  Please read the attached guides concerning COVID-19 as well as quarantine and isolation.  You may follow-up with your PCP for reevaluation.

## 2023-05-04 ENCOUNTER — Telehealth: Payer: Self-pay

## 2023-05-04 NOTE — Transitions of Care (Post Inpatient/ED Visit) (Cosign Needed)
05/04/2023  Name: [name] [name] MRN: [id number] DOB: 12/04/1995  Today's TOC FU Call Status: Today's TOC FU Call Status:: Successful TOC FU Call Completed TOC FU Call Complete Date: 05/04/23  Transition Care Management Follow-up Telephone Call Date of Discharge: 05/02/23 Discharge Facility: [facility]) Type of Discharge: Emergency Department Reason for ED Visit: Other: (covid) How have you been since you were released from the hospital?: Better Any questions or concerns?: No  Items Reviewed: Did you receive and understand the discharge instructions provided?: Yes Medications obtained,verified, and reconciled?: Yes (Medications Reviewed) Any new allergies since your discharge?: No Dietary orders reviewed?: NA Do you have support at home?: No People in Home: other relative(s)  Medications Reviewed Today: Medications Reviewed Today     Reviewed by Renelda Loma, RMA (Registered Medical Assistant) on 05/04/23 at 1426  Med List Status: <None>   Medication Order Taking? Sig Documenting Provider Last Dose Status Informant  DULoxetine (CYMBALTA) 30 MG capsule [id number] Yes Take 1 capsule (30 mg total) by mouth 2 (two) times daily. Shanna Cisco, NP Taking Active   etonogestrel (NEXPLANON) 68 MG IMPL implant [id number] Yes 68 mg by Subdermal route continuous. [provider] Taking Active Self           Med Note Windell Hummingbird   Tue May 18, 2021  9:05 PM) Year 2021   famotidine (PEPCID) 20 MG tablet [id number] Yes Take 20 mg by mouth daily. [provider] Taking Active Self  fluconazole (DIFLUCAN) 150 MG tablet [id number] No Take 1 tablet (150 mg total) by mouth daily. May repeat after 3 days if needed  Patient not taking: Reported on 01/23/2023   Ivonne Andrew, NP Not Taking Active   fluconazole (DIFLUCAN) 200 MG tablet [id number] No Take 200 mg by mouth.  Patient not taking: Reported on 01/23/2023   [provider] Not Taking Active    folic acid (FOLVITE) 1 MG tablet [id number] Yes Take 1 tablet (1 mg total) by mouth at bedtime. Darlin Drop, DO Taking Active   hydroxychloroquine (PLAQUENIL) 200 MG tablet [id number] Yes Take 100-200 mg by mouth See admin instructions. Takes 1 tablet in the morning and 1/2 tablet at night. [provider] Taking Active Self  losartan (COZAAR) 25 MG tablet [id number] Yes Take 25 mg by mouth daily. Take once daily at  bedtime [provider] Taking Active Self  magnesium (MAGTAB) 84 MG ( ) TBCR SR tablet [id number] Yes Take 84 mg by mouth daily. [provider] Taking Active   mirtazapine (REMERON) 15 MG tablet [id number] Yes TAKE 1 TABLET (15 MG TOTAL) BY MOUTH DAILY. Ivonne Andrew, NP Taking Active   mupirocin ointment (BACTROBAN) 2 % [id number] Yes Apply 1 Application topically 2 (two) times daily. Ivonne Andrew, NP Taking Active   mycophenolate (CELLCEPT) 500 MG tablet [id number] Yes Take 1,500 mg by mouth 2 (two) times daily. [provider] Taking Active Self  PERIDEX 0.12 % solution [id number] Yes 15 mLs 2 (two) times daily. [provider] Taking Active   predniSONE (DELTASONE) 20 MG tablet [id number] No 3 tabs po day one, then 2 tabs daily x 4 days  Patient not taking: Reported on 05/04/2023   Fayrene Helper, PA-C Not Taking Active   rivaroxaban (XARELTO) 10 MG TABS tablet [id number] Yes Take 1 tablet (10 mg total) by mouth daily with supper. Erenest Blank, NP Taking Active   Suvorexant [facility]) 5 MG TABS [id number] Yes Take 5 mg by mouth at bedtime  as needed. Shanna Cisco, NP Taking Active   tacrolimus (PROGRAF) 1 MG capsule [id number] Yes Take by mouth. [provider] Taking Active   triamcinolone cream (KENALOG) 0.1 % [id number] Yes Apply to AA daily and follow up in 30 days Suzi Roots Taking Active   VITAMIN D PO [id number] Yes Take by mouth. [provider] Taking Active   Vitamin D, Ergocalciferol,  (DRISDOL) 50000 units CAPS capsule [id number] No Take 1 capsule (50,000 Units total) by mouth 2 (two) times a week.  Patient not taking: Reported on 05/04/2023   [name], MD Not Taking Active            Med Note Aurther Loft, ASHELEY G   Tue May 28, 2019  1:47 PM)              Home Care and Equipment/Supplies: Were Home Health Services Ordered?: NA Any new equipment or medical supplies ordered?: NA  Functional Questionnaire: Do you need assistance with bathing/showering or dressing?: No Do you need assistance with meal preparation?: No Do you need assistance with eating?: No Do you have difficulty maintaining continence: No Do you need assistance with getting out of bed/getting out of a chair/moving?: No Do you have difficulty managing or taking your medications?: No  Follow up appointments reviewed: PCP Follow-up appointment confirmed?: Yes Date of PCP follow-up appointment?: 05/11/23 Follow-up Provider: Nicola Girt Follow-up appointment confirmed?: NA Do you need transportation to your follow-up appointment?: No Do you understand care options if your condition(s) worsen?: Yes-patient verbalized understanding    SIGNATURE. Renelda Loma RMA

## 2023-05-06 ENCOUNTER — Other Ambulatory Visit: Payer: Self-pay | Admitting: Nurse Practitioner

## 2023-05-08 ENCOUNTER — Other Ambulatory Visit: Payer: Self-pay | Admitting: Nurse Practitioner

## 2023-05-08 MED ORDER — FAMOTIDINE 20 MG PO TABS
20.0000 mg | ORAL_TABLET | Freq: Every day | ORAL | 0 refills | Status: DC
  Filled 2023-05-08: qty 30, 30d supply, fill #0

## 2023-05-09 ENCOUNTER — Other Ambulatory Visit: Payer: Self-pay

## 2023-05-11 ENCOUNTER — Inpatient Hospital Stay: Payer: Self-pay | Admitting: Nurse Practitioner

## 2023-05-23 ENCOUNTER — Other Ambulatory Visit: Payer: Self-pay

## 2023-05-24 ENCOUNTER — Encounter: Payer: Self-pay | Admitting: Family

## 2023-05-24 ENCOUNTER — Inpatient Hospital Stay (HOSPITAL_BASED_OUTPATIENT_CLINIC_OR_DEPARTMENT_OTHER): Payer: 59 | Admitting: Family

## 2023-05-24 ENCOUNTER — Inpatient Hospital Stay: Payer: 59 | Attending: Family

## 2023-05-24 VITALS — BP 139/89 | HR 83 | Temp 98.4°F | Resp 18 | Wt 192.8 lb

## 2023-05-24 DIAGNOSIS — R760 Raised antibody titer: Secondary | ICD-10-CM | POA: Diagnosis not present

## 2023-05-24 DIAGNOSIS — I2699 Other pulmonary embolism without acute cor pulmonale: Secondary | ICD-10-CM

## 2023-05-24 DIAGNOSIS — D500 Iron deficiency anemia secondary to blood loss (chronic): Secondary | ICD-10-CM

## 2023-05-24 DIAGNOSIS — I82411 Acute embolism and thrombosis of right femoral vein: Secondary | ICD-10-CM

## 2023-05-24 DIAGNOSIS — E611 Iron deficiency: Secondary | ICD-10-CM | POA: Diagnosis present

## 2023-05-24 LAB — CBC WITH DIFFERENTIAL (CANCER CENTER ONLY)
Abs Immature Granulocytes: 0.03 10*3/uL (ref 0.00–0.07)
Basophils Absolute: 0 10*3/uL (ref 0.0–0.1)
Basophils Relative: 0 %
Eosinophils Absolute: 0.2 10*3/uL (ref 0.0–0.5)
Eosinophils Relative: 2 %
HCT: 31.8 % — ABNORMAL LOW (ref 36.0–46.0)
Hemoglobin: 10.1 g/dL — ABNORMAL LOW (ref 12.0–15.0)
Immature Granulocytes: 0 %
Lymphocytes Relative: 22 %
Lymphs Abs: 1.6 10*3/uL (ref 0.7–4.0)
MCH: 25.3 pg — ABNORMAL LOW (ref 26.0–34.0)
MCHC: 31.8 g/dL (ref 30.0–36.0)
MCV: 79.7 fL — ABNORMAL LOW (ref 80.0–100.0)
Monocytes Absolute: 0.6 10*3/uL (ref 0.1–1.0)
Monocytes Relative: 7 %
Neutro Abs: 5.1 10*3/uL (ref 1.7–7.7)
Neutrophils Relative %: 69 %
Platelet Count: 352 10*3/uL (ref 150–400)
RBC: 3.99 MIL/uL (ref 3.87–5.11)
RDW: 15 % (ref 11.5–15.5)
WBC Count: 7.5 10*3/uL (ref 4.0–10.5)
nRBC: 0 % (ref 0.0–0.2)

## 2023-05-24 LAB — CMP (CANCER CENTER ONLY)
ALT: 9 U/L (ref 0–44)
AST: 15 U/L (ref 15–41)
Albumin: 3.4 g/dL — ABNORMAL LOW (ref 3.5–5.0)
Alkaline Phosphatase: 65 U/L (ref 38–126)
Anion gap: 6 (ref 5–15)
BUN: 24 mg/dL — ABNORMAL HIGH (ref 6–20)
CO2: 21 mmol/L — ABNORMAL LOW (ref 22–32)
Calcium: 9.2 mg/dL (ref 8.9–10.3)
Chloride: 109 mmol/L (ref 98–111)
Creatinine: 2.07 mg/dL — ABNORMAL HIGH (ref 0.44–1.00)
GFR, Estimated: 33 mL/min — ABNORMAL LOW (ref >60)
Glucose, Bld: 93 mg/dL (ref 70–99)
Potassium: 4.2 mmol/L (ref 3.5–5.1)
Sodium: 136 mmol/L (ref 135–145)
Total Bilirubin: 0.2 mg/dL — ABNORMAL LOW (ref 0.3–1.2)
Total Protein: 8.3 g/dL — ABNORMAL HIGH (ref 6.5–8.1)

## 2023-05-24 LAB — RETICULOCYTES
Immature Retic Fract: 10.4 % (ref 2.3–15.9)
RBC.: 3.96 MIL/uL (ref 3.87–5.11)
Retic Count, Absolute: 56.2 10*3/uL (ref 19.0–186.0)
Retic Ct Pct: 1.4 % (ref 0.4–3.1)

## 2023-05-24 LAB — FERRITIN: Ferritin: 11 ng/mL (ref 11–307)

## 2023-05-24 NOTE — Progress Notes (Signed)
Hematology and Oncology Follow Up Visit  [name] [name] [id number] 1996/09/10 27 y.o. 05/24/2023   Principle Diagnosis:  DVT of the right lower extremity by [place] and VQ scan indeterminate of PE in August/September 2020 Lupus anticoagulant positive --transient positivity. Iron deficiency   Current Therapy:        IV iron as indicated  Xarelto 10 mg PO Daily   Interim History:  [name] [name] is here today for follow-up. She is getting over a bout with Covid 4 weeks ago. Still some mild SOB with exertion.  She has had n/v with GERD after eating. She plans to discuss adjustment in her medication with her PCP.  No fever, chills, cough, rash, dizziness, chest pain, palpitations, abdominal pain or changes in bowel or bladder habits.  Cycle has been heavy. She states that her gynecologist has her hold her Xarelto 1-2 days until her flow slows and then restart.  No other blood loss noted.  No abnormal bruising, no petechiae.  No swelling in her extremities at this time.  Tingling in her feet with variances in temperature is unchanged.  No falls or syncope.  Appetite comes and hydration have still been good. Weight is 192 lbs.   ECOG Performance Status: 1 - Symptomatic but completely ambulatory  Medications:  Allergies as of 05/24/2023       Reactions   Lisinopril Other (See Comments)    Angioedema    Latex Rash        Medication List        Accurate as of May 24, 2023  1:12 PM. If you have any questions, ask your nurse or doctor.          Belsomra 5 MG Tabs Generic drug: Suvorexant Take 5 mg by mouth at bedtime as needed.   DULoxetine 30 MG capsule Commonly known as: Cymbalta Take 1 capsule (30 mg total) by mouth 2 (two) times daily.   famotidine 20 MG tablet Commonly known as: PEPCID Take 1 tablet (20 mg total) by mouth daily.   fluconazole 200 MG tablet Commonly known as: DIFLUCAN Take 200 mg by mouth.   fluconazole 150 MG tablet Commonly known as:  Diflucan Take 1 tablet (150 mg total) by mouth daily. May repeat after 3 days if needed   folic acid 1 MG tablet Commonly known as: FOLVITE Take 1 tablet (1 mg total) by mouth at bedtime.   hydroxychloroquine 200 MG tablet Commonly known as: PLAQUENIL Take 100-200 mg by mouth See admin instructions. Takes 1 tablet in the morning and 1/2 tablet at night.   losartan 25 MG tablet Commonly known as: COZAAR Take 25 mg by mouth daily. Take once daily at  bedtime   magnesium 84 MG ( ) Tbcr SR tablet Commonly known as: MAGTAB Take 84 mg by mouth daily.   mirtazapine 15 MG tablet Commonly known as: REMERON TAKE 1 TABLET (15 MG TOTAL) BY MOUTH DAILY.   mupirocin ointment 2 % Commonly known as: BACTROBAN Apply 1 Application topically 2 (two) times daily.   mycophenolate 500 MG tablet Commonly known as: CELLCEPT Take 1,500 mg by mouth 2 (two) times daily.   Nexplanon 68 MG Impl implant Generic drug: etonogestrel 68 mg by Subdermal route continuous.   Peridex 0.12 % solution Generic drug: chlorhexidine 15 mLs 2 (two) times daily.   predniSONE 20 MG tablet Commonly known as: DELTASONE 3 tabs po day one, then 2 tabs daily x 4 days   rivaroxaban 10 MG Tabs tablet Commonly known as:  Xarelto Take 1 tablet (10 mg total) by mouth daily with supper.   tacrolimus 1 MG capsule Commonly known as: PROGRAF Take by mouth.   triamcinolone cream 0.1 % Commonly known as: KENALOG Apply to AA daily and follow up in 30 days   Vitamin D (Ergocalciferol) 1.25 MG (50000 UNIT) Caps capsule Commonly known as: DRISDOL Take 1 capsule (50,000 Units total) by mouth 2 (two) times a week.   VITAMIN D PO Take by mouth.        Allergies:  Allergies  Allergen Reactions   Lisinopril Other (See Comments)     Angioedema    Latex Rash    Past Medical History, Surgical history, Social history, and Family History were reviewed and updated.  Review of Systems: All other 10 point review of  systems is negative.   Physical Exam:  vitals were not taken for this visit.   Wt Readings from Last 3 Encounters:  05/02/23 191 lb 12.8 oz (87 kg)  03/31/23 193 lb 3.2 oz (87.6 kg)  03/10/23 189 lb 9.5 oz (86 kg)    Ocular: Sclerae unicteric, pupils equal, round and reactive to light Ear-nose-throat: Oropharynx clear, dentition fair Lymphatic: No cervical or supraclavicular adenopathy Lungs no rales or rhonchi, good excursion bilaterally Heart regular rate and rhythm, no murmur appreciated Abd soft, nontender, positive bowel sounds MSK no focal spinal tenderness, no joint edema Neuro: non-focal, well-oriented, appropriate affect Breasts: Deferred   Lab Results  Component Value Date   WBC 7.5 05/24/2023   HGB 10.1 (L) 05/24/2023   HCT 31.8 (L) 05/24/2023   MCV 79.7 (L) 05/24/2023   PLT 352 05/24/2023   Lab Results  Component Value Date   FERRITIN 33 11/21/2022   IRON 44 11/21/2022   TIBC 267 11/21/2022   UIBC 223 11/21/2022   IRONPCTSAT 17 11/21/2022   Lab Results  Component Value Date   RETICCTPCT 1.5 11/21/2022   RBC 3.99 05/24/2023   No results found for: "KPAFRELGTCHN", "LAMBDASER", "KAPLAMBRATIO" No results found for: "IGGSERUM", "IGA", "IGMSERUM" No results found for: "TOTALPROTELP", "ALBUMINELP", "A1GS", "A2GS", "BETS", "BETA2SER", "GAMS", "MSPIKE", "SPEI"   Chemistry      Component Value Date/Time   NA 136 11/21/2022 1247   NA 140 12/03/2019 1449   K 4.2 11/21/2022 1247   CL 105 11/21/2022 1247   CO2 23 11/21/2022 1247   BUN 20 11/21/2022 1247   BUN 11 12/03/2019 1449   CREATININE 1.58 (H) 11/21/2022 1247   CREATININE 0.57 12/11/2017 1018      Component Value Date/Time   CALCIUM 8.8 (L) 11/21/2022 1247   ALKPHOS 70 11/21/2022 1247   AST 21 11/21/2022 1247   ALT 13 11/21/2022 1247   BILITOT 0.2 (L) 11/21/2022 1247       Impression and Plan:  [name] [name] is a very pleasant 27 yo African American female with history of systemic lupus  erythematosus and Raynaud's. She was diagnosed with right lower extremity DVT back in August 2020.   She will continue the Xarelto 10 mg PO daily maintenance.   Iron studies are pending. We will replace if needed.  Follow-up in 6 months.   Eileen Stanford, NP 9/11/20241:12 PM

## 2023-05-25 ENCOUNTER — Encounter: Payer: Self-pay | Admitting: Nurse Practitioner

## 2023-05-25 ENCOUNTER — Ambulatory Visit (INDEPENDENT_AMBULATORY_CARE_PROVIDER_SITE_OTHER): Payer: 59 | Admitting: Nurse Practitioner

## 2023-05-25 VITALS — BP 131/85 | HR 91 | Temp 97.0°F | Ht 62.0 in | Wt 195.0 lb

## 2023-05-25 DIAGNOSIS — K219 Gastro-esophageal reflux disease without esophagitis: Secondary | ICD-10-CM | POA: Insufficient documentation

## 2023-05-25 DIAGNOSIS — Z8616 Personal history of COVID-19: Secondary | ICD-10-CM | POA: Diagnosis not present

## 2023-05-25 LAB — IRON AND IRON BINDING CAPACITY (CC-WL,HP ONLY)
Iron: 30 ug/dL (ref 28–170)
Saturation Ratios: 9 % — ABNORMAL LOW (ref 10.4–31.8)
TIBC: 335 ug/dL (ref 250–450)
UIBC: 305 ug/dL (ref 148–442)

## 2023-05-25 LAB — LUPUS ANTICOAGULANT PANEL
DRVVT: 31.9 s (ref 0.0–47.0)
PTT Lupus Anticoagulant: 34.4 s (ref 0.0–43.5)

## 2023-05-25 MED ORDER — OMEPRAZOLE 40 MG PO CPDR
40.0000 mg | DELAYED_RELEASE_CAPSULE | Freq: Every day | ORAL | 3 refills | Status: DC
Start: 2023-05-25 — End: 2023-08-21

## 2023-05-25 NOTE — Patient Instructions (Signed)
1. Gastroesophageal reflux disease without esophagitis  - omeprazole (PRILOSEC) 40 MG capsule; Take 1 capsule (40 mg total) by mouth daily.  Dispense: 30 capsule; Refill: 3   Follow up:  Follow up in 3 months

## 2023-05-25 NOTE — Assessment & Plan Note (Signed)
-   omeprazole (PRILOSEC) 40 MG capsule; Take 1 capsule (40 mg total) by mouth daily.  Dispense: 30 capsule; Refill: 3   Follow up:  Follow up in 3 months

## 2023-05-25 NOTE — Progress Notes (Signed)
Subjective   @Patient  ID: [name] [name], female    DOB: [birth year], 27 y.o.   MRN: [id number]  Chief Complaint  Patient presents with   Hospitalization Follow-up    Covid per pt she feels better    Referring provider: Ivonne Andrew, NP   HPI  Patient presents today for a follow-up for ED visit.  Overall patient has been doing well.  She did test positive for COVID on 05/02/2023.  Imaging was negative for pneumonia in the hospital.  Patient states that she has been having ongoing reflux since having COVID.  She cannot pick up her Pepcid at this time because insurance will not pay for it until the end of the month.  We will trial omeprazole. Denies f/c/s, n/v/d, hemoptysis, PND, leg swelling Denies chest pain or edema      Allergies  Allergen Reactions   Lisinopril Other (See Comments)     Angioedema    Latex Rash    Immunization History  Administered Date(s) Administered   Influenza,inj,Quad PF,6+ Mos 08/11/2015, 07/27/2018, 05/17/2019, 07/08/2021   Pneumococcal Conjugate-13 05/13/2016, 05/28/2021   Pneumococcal Polysaccharide-23 05/29/2019    Past Medical History:  Diagnosis Date   Accidental methotrexate overdose 01/08/2018   Acute lower UTI    Asthma    Bronchitis 03/19/2019   Cardiomegaly 05/08/2019   Chronic midline low back pain without sciatica 11/09/2016   Facet joint arthropathy   Dehydration 03/19/2019   Depo-Provera contraceptive status 08/11/2015   Depression 02/08/2017   Dysphagia 06/01/2018   Dysuria 04/19/2019   Elevated AST (SGOT) 03/19/2019   Essential hypertension 03/19/2019   Facial edema    Gout    Hypertension    IDA (iron deficiency anemia) 08/11/2015   Immunosuppressed status (HCC)    Insomnia 01/29/2016   Leukopenia 01/10/2018   Lobar pneumonia (HCC) 06/07/2018   Lupus (HCC)    Lupus (systemic lupus erythematosus) (HCC) 03/19/2019   Metabolic acidosis 03/19/2019   Methotrexate toxicity    Mouth ulcers    Mucositis oral  01/12/2018   Odynophagia 06/01/2018   Oral thrush 03/19/2019   Pleuritic chest pain 06/05/2018   Primary osteoarthritis of both knees 11/09/2016   Rash due to systemic lupus erythematosus (SLE) (HCC)    Raynaud disease    Raynaud's disease 08/11/2015   Relative acute adrenal insufficiency (HCC) 06/11/2018   Right leg DVT (HCC) 05/08/2019   Seasonal allergies 08/11/2015   Sepsis (HCC) 05/08/2019   Sinus tachycardia 03/19/2019   SLE (systemic lupus erythematosus) (HCC) 09/19/2016     Positive ANA, double-stranded DNA, Ro, RNP, RF, history of nasal ulcers, fatigue, anemia, arthritis, Raynauds   Thrush 06/01/2018   Vaginal candidiasis 01/12/2018   Vitamin D deficiency 11/09/2016    Tobacco History: Social History   Tobacco Use  Smoking Status Never  Smokeless Tobacco Never   Counseling given: Not Answered   Outpatient Encounter Medications as of 05/25/2023  Medication Sig   DULoxetine (CYMBALTA) 30 MG capsule Take 1 capsule (30 mg total) by mouth 2 (two) times daily.   etonogestrel (NEXPLANON) 68 MG IMPL implant 68 mg by Subdermal route continuous.   famotidine (PEPCID) 20 MG tablet Take 1 tablet (20 mg total) by mouth daily.   folic acid (FOLVITE) 1 MG tablet Take 1 tablet (1 mg total) by mouth at bedtime.   hydroxychloroquine (PLAQUENIL) 200 MG tablet Take 100-200 mg by mouth See admin instructions. Takes 1 tablet in the morning and 1/2 tablet at night.   losartan (COZAAR) 25  MG tablet Take 25 mg by mouth daily. Take once daily at  bedtime   magnesium (MAGTAB) 84 MG ( ) TBCR SR tablet Take 84 mg by mouth daily.   mirtazapine (REMERON) 15 MG tablet TAKE 1 TABLET (15 MG TOTAL) BY MOUTH DAILY.   mupirocin ointment (BACTROBAN) 2 % Apply 1 Application topically 2 (two) times daily.   mycophenolate (CELLCEPT) 500 MG tablet Take 1,500 mg by mouth 2 (two) times daily.   omeprazole (PRILOSEC) 40 MG capsule Take 1 capsule (40 mg total) by mouth daily.   PERIDEX 0.12 % solution 15 mLs  2 (two) times daily.   rivaroxaban (XARELTO) 10 MG TABS tablet Take 1 tablet (10 mg total) by mouth daily with supper.   tacrolimus (PROGRAF) 1 MG capsule Take by mouth.   triamcinolone cream (KENALOG) 0.1 % Apply to AA daily and follow up in 30 days   VITAMIN D PO Take by mouth.   Vitamin D, Ergocalciferol, (DRISDOL) 50000 units CAPS capsule Take 1 capsule (50,000 Units total) by mouth 2 (two) times a week.   Suvorexant (BELSOMRA) 5 MG TABS Take 5 mg by mouth at bedtime as needed. (Patient not taking: Reported on 05/25/2023)   [DISCONTINUED] fluconazole (DIFLUCAN) 150 MG tablet Take 1 tablet (150 mg total) by mouth daily. May repeat after 3 days if needed (Patient not taking: Reported on 01/23/2023)   [DISCONTINUED] fluconazole (DIFLUCAN) 200 MG tablet Take 200 mg by mouth. (Patient not taking: Reported on 01/23/2023)   [DISCONTINUED] predniSONE (DELTASONE) 20 MG tablet 3 tabs po day one, then 2 tabs daily x 4 days (Patient not taking: Reported on 05/04/2023)   No facility-administered encounter medications on file as of 05/25/2023.    Review of Systems  Review of Systems   Objective:   BP 131/85   Pulse 91   Temp (!) 97 F (36.1 C)   Ht 5\' 2"  (1.575 m)   Wt 195 lb (88.5 kg)   SpO2 100%   BMI 35.67 kg/m   Wt Readings from Last 5 Encounters:  05/25/23 195 lb (88.5 kg)  05/24/23 192 lb 12.8 oz (87.5 kg)  05/02/23 191 lb 12.8 oz (87 kg)  03/31/23 193 lb 3.2 oz (87.6 kg)  03/10/23 189 lb 9.5 oz (86 kg)     Physical Exam    Assessment & Plan:   Gastroesophageal reflux disease without esophagitis - omeprazole (PRILOSEC) 40 MG capsule; Take 1 capsule (40 mg total) by mouth daily.  Dispense: 30 capsule; Refill: 3   Follow up:  Follow up in 3 months   No follow-ups on file.   Ivonne Andrew, NP 05/25/2023

## 2023-06-01 ENCOUNTER — Inpatient Hospital Stay: Payer: 59

## 2023-06-01 VITALS — BP 130/94 | HR 85 | Temp 99.6°F

## 2023-06-01 DIAGNOSIS — D520 Dietary folate deficiency anemia: Secondary | ICD-10-CM

## 2023-06-01 DIAGNOSIS — E611 Iron deficiency: Secondary | ICD-10-CM | POA: Diagnosis not present

## 2023-06-01 DIAGNOSIS — D500 Iron deficiency anemia secondary to blood loss (chronic): Secondary | ICD-10-CM

## 2023-06-01 MED ORDER — SODIUM CHLORIDE 0.9 % IV SOLN
510.0000 mg | Freq: Once | INTRAVENOUS | Status: AC
  Administered 2023-06-01: 510 mg via INTRAVENOUS
  Filled 2023-06-01: qty 17

## 2023-06-01 MED ORDER — SODIUM CHLORIDE 0.9 % IV SOLN: Freq: Once | INTRAVENOUS | Status: AC

## 2023-06-01 NOTE — Patient Instructions (Signed)
Ferumoxytol Injection What is this medication? FERUMOXYTOL (FER ue MOX i tol) treats low levels of iron in your body (iron deficiency anemia). Iron is a mineral that plays an important role in making red blood cells, which carry oxygen from your lungs to the rest of your body. This medicine may be used for other purposes; ask your health care provider or pharmacist if you have questions. COMMON BRAND NAME(S): Feraheme What should I tell my care team before I take this medication? They need to know if you have any of these conditions: Anemia not caused by low iron levels High levels of iron in the blood Magnetic resonance imaging (MRI) test scheduled An unusual or allergic reaction to iron, other medications, foods, dyes, or preservatives Pregnant or trying to get pregnant Breastfeeding How should I use this medication? This medication is injected into a vein. It is given by your care team in a hospital or clinic setting. Talk to your care team the use of this medication in children. Special care may be needed. Overdosage: If you think you have taken too much of this medicine contact a poison control center or emergency room at once. NOTE: This medicine is only for you. Do not share this medicine with others. What if I miss a dose? It is important not to miss your dose. Call your care team if you are unable to keep an appointment. What may interact with this medication? Other iron products This list may not describe all possible interactions. Give your health care provider a list of all the medicines, herbs, non-prescription drugs, or dietary supplements you use. Also tell them if you smoke, drink alcohol, or use illegal drugs. Some items may interact with your medicine. What should I watch for while using this medication? Visit your care team regularly. Tell your care team if your symptoms do not start to get better or if they get worse. You may need blood work done while you are taking this  medication. You may need to follow a special diet. Talk to your care team. Foods that contain iron include: whole grains/cereals, dried fruits, beans, or peas, leafy green vegetables, and organ meats (liver, kidney). What side effects may I notice from receiving this medication? Side effects that you should report to your care team as soon as possible: Allergic reactions--skin rash, itching, hives, swelling of the face, lips, tongue, or throat Low blood pressure--dizziness, feeling faint or lightheaded, blurry vision Shortness of breath Side effects that usually do not require medical attention (report to your care team if they continue or are bothersome): Flushing Headache Joint pain Muscle pain Nausea Pain, redness, or irritation at injection site This list may not describe all possible side effects. Call your doctor for medical advice about side effects. You may report side effects to FDA at 1-800-FDA-1088. Where should I keep my medication? This medication is given in a hospital or clinic. It will not be stored at home. NOTE: This sheet is a summary. It may not cover all possible information. If you have questions about this medicine, talk to your doctor, pharmacist, or health care provider.  2024 Elsevier/Gold Standard (2023-02-03 00:00:00)

## 2023-06-08 ENCOUNTER — Telehealth: Payer: Self-pay | Admitting: Hematology & Oncology

## 2023-06-08 ENCOUNTER — Inpatient Hospital Stay: Payer: 59

## 2023-06-08 VITALS — BP 135/94 | Temp 98.6°F | Resp 18

## 2023-06-08 DIAGNOSIS — D500 Iron deficiency anemia secondary to blood loss (chronic): Secondary | ICD-10-CM

## 2023-06-08 DIAGNOSIS — E611 Iron deficiency: Secondary | ICD-10-CM | POA: Diagnosis not present

## 2023-06-08 MED ORDER — SODIUM CHLORIDE 0.9 % IV SOLN
510.0000 mg | Freq: Once | INTRAVENOUS | Status: AC
  Administered 2023-06-08: 510 mg via INTRAVENOUS
  Filled 2023-06-08: qty 510

## 2023-06-08 MED ORDER — SODIUM CHLORIDE 0.9 % IV SOLN: Freq: Once | INTRAVENOUS | Status: AC

## 2023-06-08 NOTE — Telephone Encounter (Signed)
Pt declined scheduling follow up appointment at this time (due back March 2025). Pt said they would call when they are ready to schedule.

## 2023-06-08 NOTE — Patient Instructions (Signed)
Ferumoxytol Injection What is this medication? FERUMOXYTOL (FER ue MOX i tol) treats low levels of iron in your body (iron deficiency anemia). Iron is a mineral that plays an important role in making red blood cells, which carry oxygen from your lungs to the rest of your body. This medicine may be used for other purposes; ask your health care provider or pharmacist if you have questions. COMMON BRAND NAME(S): Feraheme What should I tell my care team before I take this medication? They need to know if you have any of these conditions: Anemia not caused by low iron levels High levels of iron in the blood Magnetic resonance imaging (MRI) test scheduled An unusual or allergic reaction to iron, other medications, foods, dyes, or preservatives Pregnant or trying to get pregnant Breastfeeding How should I use this medication? This medication is injected into a vein. It is given by your care team in a hospital or clinic setting. Talk to your care team the use of this medication in children. Special care may be needed. Overdosage: If you think you have taken too much of this medicine contact a poison control center or emergency room at once. NOTE: This medicine is only for you. Do not share this medicine with others. What if I miss a dose? It is important not to miss your dose. Call your care team if you are unable to keep an appointment. What may interact with this medication? Other iron products This list may not describe all possible interactions. Give your health care provider a list of all the medicines, herbs, non-prescription drugs, or dietary supplements you use. Also tell them if you smoke, drink alcohol, or use illegal drugs. Some items may interact with your medicine. What should I watch for while using this medication? Visit your care team regularly. Tell your care team if your symptoms do not start to get better or if they get worse. You may need blood work done while you are taking this  medication. You may need to follow a special diet. Talk to your care team. Foods that contain iron include: whole grains/cereals, dried fruits, beans, or peas, leafy green vegetables, and organ meats (liver, kidney). What side effects may I notice from receiving this medication? Side effects that you should report to your care team as soon as possible: Allergic reactions--skin rash, itching, hives, swelling of the face, lips, tongue, or throat Low blood pressure--dizziness, feeling faint or lightheaded, blurry vision Shortness of breath Side effects that usually do not require medical attention (report to your care team if they continue or are bothersome): Flushing Headache Joint pain Muscle pain Nausea Pain, redness, or irritation at injection site This list may not describe all possible side effects. Call your doctor for medical advice about side effects. You may report side effects to FDA at 1-800-FDA-1088. Where should I keep my medication? This medication is given in a hospital or clinic. It will not be stored at home. NOTE: This sheet is a summary. It may not cover all possible information. If you have questions about this medicine, talk to your doctor, pharmacist, or health care provider.  2024 Elsevier/Gold Standard (2023-02-03 00:00:00)

## 2023-06-08 NOTE — Progress Notes (Signed)
Patient does not want to stay for the 30 minute recommended post IV iron observation. Patient discharged ambulatory without complaints or concerns.

## 2023-08-20 ENCOUNTER — Other Ambulatory Visit: Payer: Self-pay | Admitting: Nurse Practitioner

## 2023-08-20 DIAGNOSIS — K219 Gastro-esophageal reflux disease without esophagitis: Secondary | ICD-10-CM

## 2023-10-02 ENCOUNTER — Ambulatory Visit (INDEPENDENT_AMBULATORY_CARE_PROVIDER_SITE_OTHER): Payer: 59 | Admitting: Nurse Practitioner

## 2023-10-02 ENCOUNTER — Encounter: Payer: Self-pay | Admitting: Nurse Practitioner

## 2023-10-02 VITALS — BP 138/106 | HR 83 | Temp 97.2°F | Wt 192.0 lb

## 2023-10-02 DIAGNOSIS — Z1159 Encounter for screening for other viral diseases: Secondary | ICD-10-CM | POA: Diagnosis not present

## 2023-10-02 DIAGNOSIS — Z23 Encounter for immunization: Secondary | ICD-10-CM

## 2023-10-02 MED ORDER — MIRTAZAPINE 15 MG PO TABS: 15.0000 mg | ORAL_TABLET | Freq: Every day | ORAL | 1 refills | Status: DC

## 2023-10-02 NOTE — Patient Instructions (Signed)
1. Need for hepatitis C screening test (Primary)  - Hepatitis C antibody   Follow up:  Follow up in 6 months

## 2023-10-02 NOTE — Progress Notes (Signed)
Subjective   Patient ID: [name] [name], female    DOB: 06/06/1996, 28 y.o.   MRN: [id number]  Chief Complaint  Patient presents with   Follow-up    Referring provider: Ivonne Andrew, NP  [name] [name] is a 28 y.o. female with Past Medical History: 01/08/2018: Accidental methotrexate overdose No date: Acute lower UTI No date: Asthma 03/19/2019: Bronchitis 05/08/2019: Cardiomegaly 11/09/2016: Chronic midline low back pain without sciatica     Comment:  Facet joint arthropathy 03/19/2019: Dehydration 08/11/2015: Depo-Provera contraceptive status 02/08/2017: Depression 06/01/2018: Dysphagia 04/19/2019: Dysuria 03/19/2019: Elevated AST (SGOT) 03/19/2019: Essential hypertension No date: Facial edema No date: Gout No date: Hypertension 08/11/2015: IDA (iron deficiency anemia) No date: Immunosuppressed status (HCC) 01/29/2016: Insomnia 01/10/2018: Leukopenia 06/07/2018: Lobar pneumonia (HCC) No date: Lupus 03/19/2019: Lupus (systemic lupus erythematosus) (HCC) 03/19/2019: Metabolic acidosis No date: Methotrexate toxicity No date: Mouth ulcers 01/12/2018: Mucositis oral 06/01/2018: Odynophagia 03/19/2019: Oral thrush 06/05/2018: Pleuritic chest pain 11/09/2016: Primary osteoarthritis of both knees No date: Rash due to systemic lupus erythematosus (SLE) (HCC) No date: Raynaud disease 08/11/2015: Raynaud's disease 06/11/2018: Relative acute adrenal insufficiency (HCC) 05/08/2019: Right leg DVT (HCC) 08/11/2015: Seasonal allergies 05/08/2019: Sepsis (HCC) 03/19/2019: Sinus tachycardia 09/19/2016: SLE (systemic lupus erythematosus) (HCC)     Comment:    Positive ANA, double-stranded DNA, Ro, RNP, RF,               history of nasal ulcers, fatigue, anemia, arthritis,               Raynauds 06/01/2018: Thrush 01/12/2018: Vaginal candidiasis 11/09/2016: Vitamin D deficiency   HPI  Patient presents today for follow-up visit.  She is currently followed by  rheumatology and nephrology.  She did recently have blood work done.  She is due for hepatitis C screening. Denies f/c/s, n/v/d, hemoptysis, PND, leg swelling Denies chest pain or edema      Allergies  Allergen Reactions   Lisinopril Other (See Comments)     Angioedema    Latex Rash    Immunization History  Administered Date(s) Administered   Influenza,inj,Quad PF,6+ Mos 08/11/2015, 07/27/2018, 05/17/2019, 07/08/2021   Pneumococcal Conjugate-13 05/13/2016, 05/28/2021   Pneumococcal Polysaccharide-23 05/29/2019   Td 10/02/2023    Tobacco History: Social History   Tobacco Use  Smoking Status Never  Smokeless Tobacco Never   Counseling given: Not Answered   Outpatient Encounter Medications as of 10/02/2023  Medication Sig   DULoxetine (CYMBALTA) 30 MG capsule Take 1 capsule (30 mg total) by mouth 2 (two) times daily.   etonogestrel (NEXPLANON) 68 MG IMPL implant 68 mg by Subdermal route continuous.   famotidine (PEPCID) 20 MG tablet Take 1 tablet (20 mg total) by mouth daily.   folic acid (FOLVITE) 1 MG tablet Take 1 tablet (1 mg total) by mouth at bedtime.   hydroxychloroquine (PLAQUENIL) 200 MG tablet Take 100-200 mg by mouth See admin instructions. Takes 1 tablet in the morning and 1/2 tablet at night.   losartan (COZAAR) 25 MG tablet Take 25 mg by mouth daily. Take once daily at  bedtime   magnesium (MAGTAB) 84 MG ( ) TBCR SR tablet Take 84 mg by mouth daily.   mupirocin ointment (BACTROBAN) 2 % Apply 1 Application topically 2 (two) times daily.   mycophenolate (CELLCEPT) 500 MG tablet Take 1,500 mg by mouth 2 (two) times daily.   omeprazole (PRILOSEC) 40 MG capsule TAKE 1 CAPSULE (40 MG TOTAL) BY MOUTH DAILY.   PERIDEX 0.12 % solution 15 mLs 2 (  two) times daily.   rivaroxaban (XARELTO) 10 MG TABS tablet Take 1 tablet (10 mg total) by mouth daily with supper.   Suvorexant (BELSOMRA) 5 MG TABS Take 5 mg by mouth at bedtime as needed.   tacrolimus (PROGRAF) 1 MG  capsule Take by mouth.   triamcinolone cream (KENALOG) 0.1 % Apply to AA daily and follow up in 30 days   Vitamin D, Ergocalciferol, (DRISDOL) 50000 units CAPS capsule Take 1 capsule (50,000 Units total) by mouth 2 (two) times a week.   [DISCONTINUED] mirtazapine (REMERON) 15 MG tablet TAKE 1 TABLET (15 MG TOTAL) BY MOUTH DAILY.   mirtazapine (REMERON) 15 MG tablet Take 1 tablet (15 mg total) by mouth daily.   VITAMIN D PO Take by mouth. (Patient not taking: Reported on 10/02/2023)   No facility-administered encounter medications on file as of 10/02/2023.    Review of Systems  Review of Systems  Constitutional: Negative.   HENT: Negative.    Cardiovascular: Negative.   Gastrointestinal: Negative.   Allergic/Immunologic: Negative.   Neurological: Negative.   Psychiatric/Behavioral: Negative.       Objective:   BP (!) 138/106   Pulse 83   Temp (!) 97.2 F (36.2 C)   Wt 192 lb (87.1 kg)   SpO2 99%   BMI 35.12 kg/m   Wt Readings from Last 5 Encounters:  10/02/23 192 lb (87.1 kg)  05/25/23 195 lb (88.5 kg)  05/24/23 192 lb 12.8 oz (87.5 kg)  05/02/23 191 lb 12.8 oz (87 kg)  03/31/23 193 lb 3.2 oz (87.6 kg)     Physical Exam Vitals and nursing note reviewed.  Constitutional:      General: She is not in acute distress.    Appearance: She is well-developed.  Cardiovascular:     Rate and Rhythm: Normal rate and regular rhythm.  Pulmonary:     Effort: Pulmonary effort is normal.     Breath sounds: Normal breath sounds.  Neurological:     Mental Status: She is alert and oriented to person, place, and time.       Assessment & Plan:   Need for hepatitis C screening test -     Hepatitis C antibody  Immunization due -     Td vaccine greater than or equal to 7yo preservative free IM  Other orders -     Mirtazapine; Take 1 tablet (15 mg total) by mouth daily.  Dispense: 90 tablet; Refill: 1     Return in about 6 months (around 03/31/2024).   Ivonne Andrew,  NP 10/02/2023

## 2023-10-03 LAB — HEPATITIS C ANTIBODY: Hep C Virus Ab: NONREACTIVE

## 2023-10-06 ENCOUNTER — Ambulatory Visit: Payer: Self-pay | Admitting: Nurse Practitioner

## 2023-10-26 ENCOUNTER — Other Ambulatory Visit: Payer: Self-pay | Admitting: Family

## 2023-10-26 DIAGNOSIS — I2699 Other pulmonary embolism without acute cor pulmonale: Secondary | ICD-10-CM

## 2023-10-26 DIAGNOSIS — I82411 Acute embolism and thrombosis of right femoral vein: Secondary | ICD-10-CM

## 2023-11-30 ENCOUNTER — Encounter (HOSPITAL_COMMUNITY): Payer: Self-pay | Admitting: Emergency Medicine

## 2023-11-30 ENCOUNTER — Ambulatory Visit (HOSPITAL_COMMUNITY)
Admission: EM | Admit: 2023-11-30 | Discharge: 2023-11-30 | Disposition: A | Discharge status: Home / Self Care | Attending: Family Medicine | Admitting: Family Medicine

## 2023-11-30 DIAGNOSIS — H1031 Unspecified acute conjunctivitis, right eye: Secondary | ICD-10-CM

## 2023-11-30 DIAGNOSIS — J321 Chronic frontal sinusitis: Secondary | ICD-10-CM | POA: Diagnosis not present

## 2023-11-30 MED ORDER — DOXYCYCLINE HYCLATE 100 MG PO CAPS: 100.0000 mg | ORAL_CAPSULE | Freq: Two times a day (BID) | ORAL | 0 refills | Status: AC

## 2023-11-30 MED ORDER — FLUTICASONE PROPIONATE 50 MCG/ACT NA SUSP: 1.0000 | Freq: Every day | NASAL | 0 refills | Status: AC

## 2023-11-30 MED ORDER — CEFTRIAXONE SODIUM 500 MG IJ SOLR
500.0000 mg | Freq: Once | INTRAMUSCULAR | Status: AC
Start: 2023-11-30 — End: 2023-11-30
  Administered 2023-11-30: 500 mg via INTRAMUSCULAR

## 2023-11-30 MED ORDER — NEOMYCIN-POLYMYXIN-DEXAMETH 3.5-10000-0.1 OP SUSP: 1.0000 [drp] | Freq: Four times a day (QID) | OPHTHALMIC | 0 refills | Status: AC

## 2023-11-30 MED ORDER — CEFTRIAXONE SODIUM 500 MG IJ SOLR
INTRAMUSCULAR | Status: AC
Start: 2023-11-30 — End: ?
  Filled 2023-11-30: qty 500

## 2023-11-30 MED ORDER — LIDOCAINE HCL (PF) 1 % IJ SOLN
INTRAMUSCULAR | Status: AC
  Filled 2023-11-30: qty 2

## 2023-11-30 NOTE — Discharge Instructions (Addendum)
 Start the antibiotics and take them until they are finished I recommend starting a daily probiotic to protect your gut microbiome Administer the eyedrops as directed You can also use warm, wet washcloth over the eye to help with drainage Make sure you are washing your hands frequently to avoid spread Make sure you are also drinking plenty of water

## 2023-11-30 NOTE — ED Provider Notes (Signed)
 [facility]    CSN: [id number] Arrival date & time: 11/30/23  0840      History   Chief Complaint Chief Complaint  Patient presents with   Eye Drainage    HPI [name] [name] is a 28 y.o. female.   The patient presents with right eye, copious discharge, redness, light sensitivity, and slight irritation for 3 days.  She has had a cold for the last week with sinus pain, congestion, purulent drainage.  She was recently seen at the hospital for a scheduled renal biopsy and has a known history of lupus on hydroxychloroquine.  She has not had any fevers, chills, vision changes, eye pain with movement, sore throat, muscle aches, neck pain or stiffness, confusion, dizziness, balance issues.  Of note she does have a friend who has similar symptoms in his right eye but has not been sexually active for the last 3 years and no known exposures to STDs.   The history is provided by the patient.    Past Medical History:  Diagnosis Date   Accidental methotrexate overdose 01/08/2018   Acute lower UTI    Asthma    Bronchitis 03/19/2019   Cardiomegaly 05/08/2019   Chronic midline low back pain without sciatica 11/09/2016   Facet joint arthropathy   Dehydration 03/19/2019   Depo-Provera contraceptive status 08/11/2015   Depression 02/08/2017   Dysphagia 06/01/2018   Dysuria 04/19/2019   Elevated AST (SGOT) 03/19/2019   Essential hypertension 03/19/2019   Facial edema    Gout    Hypertension    IDA (iron deficiency anemia) 08/11/2015   Immunosuppressed status (HCC)    Insomnia 01/29/2016   Leukopenia 01/10/2018   Lobar pneumonia (HCC) 06/07/2018   Lupus    Lupus (systemic lupus erythematosus) (HCC) 03/19/2019   Metabolic acidosis 03/19/2019   Methotrexate toxicity    Mouth ulcers    Mucositis oral 01/12/2018   Odynophagia 06/01/2018   Oral thrush 03/19/2019   Pleuritic chest pain 06/05/2018   Primary osteoarthritis of both knees 11/09/2016   Rash due to systemic lupus  erythematosus (SLE) (HCC)    Raynaud disease    Raynaud's disease 08/11/2015   Relative acute adrenal insufficiency (HCC) 06/11/2018   Right leg DVT (HCC) 05/08/2019   Seasonal allergies 08/11/2015   Sepsis (HCC) 05/08/2019   Sinus tachycardia 03/19/2019   SLE (systemic lupus erythematosus) (HCC) 09/19/2016     Positive ANA, double-stranded DNA, Ro, RNP, RF, history of nasal ulcers, fatigue, anemia, arthritis, Raynauds   Thrush 06/01/2018   Vaginal candidiasis 01/12/2018   Vitamin D deficiency 11/09/2016    Patient Active Problem List   Diagnosis Date Noted   Gastroesophageal reflux disease without esophagitis 05/25/2023   History of COVID-19 05/25/2023   Cutaneous abscess of buttock 03/31/2023   Family history of breast cancer gene mutation in first degree relative 01/25/2023   Class 1 obesity due to excess calories without serious comorbidity with body mass index (BMI) of 34.0 to 34.9 in adult 11/28/2022   Cutaneous abscess of right lower extremity 08/25/2022   Yeast infection 04/07/2022   Insect bite of left forearm 01/06/2022   Menorrhagia 07/08/2021   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 05/27/2020   Anxiety 05/27/2020   Exertional dyspnea 02/22/2020   PVD (peripheral vascular disease) (HCC) 02/18/2020   Exacerbation of systemic lupus erythematosus (HCC) 12/06/2019   SLE (systemic lupus erythematosus related syndrome) (HCC) 05/29/2019   Pulmonary hypertension (HCC) 05/28/2019   Lupus nephritis, ISN/RPS  class III (HCC) 05/14/2019   Sepsis (HCC) 05/08/2019   DVT of deep femoral vein, right (HCC) 05/08/2019   Cardiomegaly 05/08/2019   Dysuria 04/19/2019   Facial edema    Rash due to systemic lupus erythematosus (SLE) (HCC)    Immunosuppressed status (HCC)    Acute lower UTI    Depressive disorder due to another medical condition with depressive features 03/24/2019   Lupus (systemic lupus erythematosus) (HCC) 03/19/2019    Bronchitis 03/19/2019   Essential hypertension 03/19/2019   Elevated AST (SGOT) 03/19/2019   Dehydration 03/19/2019   Sinus tachycardia 03/19/2019   Metabolic acidosis 03/19/2019   Oral thrush 03/19/2019   Proteinuria 11/14/2018   Relative acute adrenal insufficiency (HCC) 06/11/2018   Hypomagnesemia 06/10/2018   Anemia 06/09/2018   Lobar pneumonia (HCC) 06/07/2018   Pleuritic chest pain 06/05/2018   Dysphagia 06/01/2018   Odynophagia 06/01/2018   Thrush 06/01/2018   Mucositis oral 01/12/2018   Vaginal candidiasis 01/12/2018   Methotrexate toxicity    Mouth ulcers    Leukopenia 01/10/2018   Accidental methotrexate overdose 01/08/2018   Gout 12/27/2017   Depression 02/08/2017   Primary osteoarthritis of both knees 11/09/2016   Chronic midline low back pain without sciatica 11/09/2016   Vitamin D deficiency 11/09/2016   SLE (systemic lupus erythematosus) (HCC) 09/19/2016   High risk medication use 09/19/2016   Insomnia 01/29/2016   Depo-Provera contraceptive status 08/11/2015   Raynaud's disease without gangrene 08/11/2015   Seasonal allergies 08/11/2015   IDA (iron deficiency anemia) 08/11/2015   Healthcare maintenance 04/19/2012   History of ongoing treatment with high-risk medication 04/19/2012   Undifferentiated connective tissue disease (HCC) 04/19/2012   Raynaud phenomenon 01/30/2012    Past Surgical History:  Procedure Laterality Date   ORIF SHOULDER FRACTURE Left    RENAL BIOPSY     TONSILLECTOMY      OB History   No obstetric history on file.      Home Medications    Prior to Admission medications   Medication Sig Start Date End Date Taking? Authorizing Provider  amLODipine (NORVASC) 5 MG tablet Take 1 tablet by mouth at bedtime. 11/24/23 02/22/24 Yes [provider]  doxycycline (VIBRAMYCIN) 100 MG capsule Take 1 capsule (100 mg total) by mouth 2 (two) times daily. 11/30/23  Yes Ivor Messier, MD  fluticasone (FLONASE) 50 MCG/ACT nasal  spray Place 1 spray into both nostrils daily. 11/30/23  Yes Ivor Messier, MD  neomycin-polymyxin b-dexamethasone (MAXITROL) 3.5-10000-0.1 SUSP Place 1 drop into the right eye every 6 (six) hours for 5 days. 11/30/23 12/05/23 Yes Ivor Messier, MD  DULoxetine (CYMBALTA) 30 MG capsule Take 1 capsule (30 mg total) by mouth 2 (two) times daily. 09/16/21   Shanna Cisco, NP  etonogestrel (NEXPLANON) 68 MG IMPL implant 68 mg by Subdermal route continuous. 06/25/20 10/02/23  [provider]  famotidine (PEPCID) 20 MG tablet Take 1 tablet (20 mg total) by mouth daily. 05/08/23   Ivonne Andrew, NP  folic acid (FOLVITE) 1 MG tablet Take 1 tablet (1 mg total) by mouth at bedtime. 03/25/19   Darlin Drop, DO  hydroxychloroquine (PLAQUENIL) 200 MG tablet Take 100-200 mg by mouth See admin instructions. Takes 1 tablet in the morning and 1/2 tablet at night.    [provider]  losartan (COZAAR) 25 MG tablet Take 25 mg by mouth daily. Take once daily at  bedtime    [provider]  magnesium (MAGTAB) 84 MG ( ) TBCR SR  tablet Take 84 mg by mouth daily. 11/23/21   [provider]  mirtazapine (REMERON) 15 MG tablet Take 1 tablet (15 mg total) by mouth daily. 10/02/23   Ivonne Andrew, NP  mupirocin ointment (BACTROBAN) 2 % Apply 1 Application topically 2 (two) times daily. 03/31/23   Ivonne Andrew, NP  mycophenolate (CELLCEPT) 500 MG tablet Take 1,500 mg by mouth 2 (two) times daily. 01/22/21   [provider]  omeprazole (PRILOSEC) 40 MG capsule TAKE 1 CAPSULE (40 MG TOTAL) BY MOUTH DAILY. 08/21/23   Ivonne Andrew, NP  PERIDEX 0.12 % solution 15 mLs 2 (two) times daily. 11/10/21   [provider]  Suvorexant (BELSOMRA) 5 MG TABS Take 5 mg by mouth at bedtime as needed. 09/16/21   Shanna Cisco, NP  tacrolimus (PROGRAF) 1 MG capsule Take by mouth. 07/02/21   [provider]  triamcinolone cream (KENALOG) 0.1 % Apply to AA daily  and follow up in 30 days 02/16/22   Glyn Ade, PA-C  VITAMIN D PO Take by mouth. Patient not taking: Reported on 10/02/2023    [provider]  Vitamin D, Ergocalciferol, (DRISDOL) 50000 units CAPS capsule Take 1 capsule (50,000 Units total) by mouth 2 (two) times a week. 12/21/17   [name], MD  XARELTO 10 MG TABS tablet TAKE 1 TABLET BY MOUTH DAILY WITH SUPPER 10/26/23   Josph Macho, MD    Family History Family History  Problem Relation Age of Onset   Aneurysm Mother    Stroke Sister    Seizures Sister    Lung cancer Maternal Aunt    Heart attack Paternal Uncle    Colon cancer Neg Hx    Esophageal cancer Neg Hx    Pancreatic cancer Neg Hx    Stomach cancer Neg Hx     Social History Social History   Tobacco Use   Smoking status: Never   Smokeless tobacco: Never  Vaping Use   Vaping status: Never Used  Substance Use Topics   Alcohol use: No   Drug use: No     Allergies   Lisinopril and Latex   Review of Systems Review of Systems  Constitutional:  Negative for activity change, appetite change, chills, fatigue and fever.  HENT:  Positive for congestion, postnasal drip, rhinorrhea, sinus pressure and sinus pain. Negative for ear discharge, ear pain, facial swelling, hearing loss, mouth sores, nosebleeds, sore throat, tinnitus, trouble swallowing and voice change.   Eyes:  Positive for photophobia, discharge, redness and itching. Negative for pain and visual disturbance.  Respiratory:  Negative for shortness of breath.   Cardiovascular:  Negative for chest pain.  Musculoskeletal:  Negative for arthralgias and myalgias.  Skin:  Negative for rash.  Neurological:  Negative for dizziness, light-headedness and headaches.     Physical Exam Triage Vital Signs ED Triage Vitals  Encounter Vitals Group     BP 11/30/23 0856 121/82     Systolic BP Percentile --      Diastolic BP Percentile --      Pulse Rate 11/30/23 0856 77     Resp 11/30/23  0856 17     Temp 11/30/23 0856 98.2 F (36.8 C)     Temp Source 11/30/23 0856 Oral     SpO2 11/30/23 0856 95 %     Weight --      Height --      Head Circumference --      Peak Flow --  Pain Score 11/30/23 0855 0     Pain Loc --      Pain Education --      Exclude from Growth Chart --    No data found.  Updated Vital Signs BP 121/82 (BP Location: Right Arm)   Pulse 77   Temp 98.2 F (36.8 C) (Oral)   Resp 17   SpO2 95%   Visual Acuity Right Eye Distance:   Left Eye Distance:   Bilateral Distance:    Right Eye Near:   Left Eye Near:    Bilateral Near:     Physical Exam Vitals reviewed.  Constitutional:      General: She is not in acute distress.    Appearance: Normal appearance. She is normal weight. She is not ill-appearing, toxic-appearing or diaphoretic.  HENT:     Head: Normocephalic and atraumatic.     Comments: TTP over R maxillary sinus    Nose: Congestion and rhinorrhea present.     Comments: Purulent discharge in right nare    Mouth/Throat:     Mouth: Mucous membranes are moist.     Pharynx: Oropharynx is clear. No oropharyngeal exudate or posterior oropharyngeal erythema.  Eyes:     General: No scleral icterus.    Extraocular Movements: Extraocular movements intact.     Conjunctiva/sclera: Conjunctivae normal.     Pupils: Pupils are equal, round, and reactive to light.     Comments: Injected sclera w/ copious green discharge and crusting Crusted discharge covering tear duct Normal conjunctiva No papil edema on line shine    Pulmonary:     Effort: Pulmonary effort is normal.  Skin:    Capillary Refill: Capillary refill takes 2 to 3 seconds.     Coloration: Skin is not jaundiced.     Findings: No erythema or rash.  Neurological:     General: No focal deficit present.     Mental Status: She is alert and oriented to person, place, and time.  Psychiatric:        Mood and Affect: Mood normal.        Thought Content: Thought content normal.         Judgment: Judgment normal.      UC Treatments / Results  Labs (all labs ordered are listed, but only abnormal results are displayed) Labs Reviewed - No data to display  EKG   Radiology No results found.  Procedures Procedures (including critical care time)  Medications Ordered in UC Medications  cefTRIAXone (ROCEPHIN) injection 500 mg (500 mg Intramuscular Given 11/30/23 0946)    Initial Impression / Assessment and Plan / UC Course  I have reviewed the triage vital signs and the nursing notes.  Pertinent labs & imaging results that were available during my care of the patient were reviewed by me and considered in my medical decision making (see chart for details).     Right-sided conjunctivitis with bacterial sinusitis -The patient has SLE and is medically immunocompromise on hydroxychloroquine.  She does have exposure at the hospital for a renal biopsy last week.  She also has exposure to a friend who has similar symptoms. -There are no red flag symptoms including eye pain, vision changes, or changes in extraocular movement -She has not been sexually active for the last 3 years. - Given her immunocompromise status, possible exposure, and physical exam findings consistent with sinusitis for which she has had symptoms for a week, we will treat with oral antibiotics. - The patient was given rocephin IM  in clinic and Doxycycline prescribed which will cover sinusitis and possible GC infection - She will also start flonase and increase oral hydration -I encouraged the patient to follow-up with her PCP. - The patient voiced understanding and agreement with the plan.  Final Clinical Impressions(s) / UC Diagnoses   Final diagnoses:  Chronic frontal sinusitis  Acute bacterial conjunctivitis of right eye     Discharge Instructions      Start the antibiotics and take them until they are finished I recommend starting a daily probiotic to protect your gut  microbiome Administer the eyedrops as directed You can also use warm, wet washcloth over the eye to help with drainage Make sure you are washing your hands frequently to avoid spread Make sure you are also drinking plenty of water      ED Prescriptions     Medication Sig Dispense Auth. Provider   doxycycline (VIBRAMYCIN) 100 MG capsule Take 1 capsule (100 mg total) by mouth 2 (two) times daily. 20 capsule Ivor Messier, MD   fluticasone [facility]) 50 MCG/ACT nasal spray Place 1 spray into both nostrils daily. 1 g Ivor Messier, MD   neomycin-polymyxin b-dexamethasone (MAXITROL) 3.5-10000-0.1 SUSP Place 1 drop into the right eye every 6 (six) hours for 5 days. 1 mL Ivor Messier, MD      PDMP not reviewed this encounter.   Ivor Messier, MD 11/30/23 2232

## 2023-11-30 NOTE — ED Triage Notes (Signed)
 Pt reports right drainage, "jittery" and burning for 3 days. Reports crusted shut now. Pt has tissue covering eye.

## 2024-02-18 ENCOUNTER — Other Ambulatory Visit: Payer: Self-pay | Admitting: Nurse Practitioner

## 2024-02-18 DIAGNOSIS — K219 Gastro-esophageal reflux disease without esophagitis: Secondary | ICD-10-CM

## 2024-03-12 ENCOUNTER — Encounter: Admitting: Nurse Practitioner

## 2024-03-12 NOTE — Progress Notes (Signed)
 Subjective:   [name] [name] is a 28 y.o. female who presents for Medicare Annual (Subsequent) preventive examination.  Visit Complete: Virtual I connected with  [name] [name] on 03/12/24 by a audio enabled telemedicine application and verified that I am speaking with the correct person using two identifiers.  Patient Location: Home  Provider Location: Office/Clinic  I discussed the limitations of evaluation and management by telemedicine. The patient expressed understanding and agreed to proceed.  Vital Signs: Because this visit was a virtual/telehealth visit, some criteria may be missing or patient reported. Any vitals not documented were not able to be obtained and vitals that have been documented are patient reported.  Patient Medicare AWV questionnaire was completed by the patient on 03/12/2024; I have confirmed that all information answered by patient is correct and no changes since this date.  Cardiac Risk Factors include: dyslipidemia;hypertension;obesity (BMI >30kg/m2)     Objective:    Today's Vitals   03/12/24 1031  Weight: 190 lb (86.2 kg)  Height: 5' 2 (1.575 m)   Body mass index is 34.75 kg/m.     03/12/2024   10:38 AM 06/08/2023   10:46 AM 05/24/2023    1:19 PM 05/02/2023    1:30 PM 03/09/2023    9:01 AM 11/21/2022   12:57 PM 08/19/2022   10:04 AM  Advanced Directives  Does Patient Have a Medical Advance Directive? No No No No No No No  Would patient like information on creating a medical advance directive?  No - Patient declined No - Patient declined  No - Patient declined      Current Medications (verified) Outpatient Encounter Medications as of 03/14/2024  Medication Sig   etonogestrel (NEXPLANON) 68 MG IMPL implant 68 mg by Subdermal route continuous.   famotidine  (PEPCID ) 20 MG tablet Take 1 tablet (20 mg total) by mouth daily.   folic acid  (FOLVITE ) 1 MG tablet Take 1 tablet (1 mg total) by mouth at bedtime.   hydroxychloroquine (PLAQUENIL) 200 MG tablet  Take 100-200 mg by mouth See admin instructions. Takes 1 tablet in the morning and 1/2 tablet at night.   losartan (COZAAR) 25 MG tablet Take 25 mg by mouth daily. Take once daily at  bedtime   magnesium  (MAGTAB) 84 MG ( ) TBCR SR tablet Take 84 mg by mouth daily.   mirtazapine  (REMERON ) 15 MG tablet Take 1 tablet (15 mg total) by mouth daily.   mupirocin  ointment (BACTROBAN ) 2 % Apply 1 Application topically 2 (two) times daily.   mycophenolate  (CELLCEPT ) 500 MG tablet Take 1,500 mg by mouth 2 (two) times daily.   omeprazole  (PRILOSEC) 40 MG capsule TAKE 1 CAPSULE (40 MG TOTAL) BY MOUTH DAILY.   PERIDEX  0.12 % solution 15 mLs 2 (two) times daily.   Suvorexant  (BELSOMRA ) 5 MG TABS Take 5 mg by mouth at bedtime as needed.   tacrolimus  (PROGRAF ) 1 MG capsule Take by mouth.   triamcinolone  cream (KENALOG ) 0.1 % Apply to AA daily and follow up in 30 days   Vitamin D , Ergocalciferol , (DRISDOL ) 50000 units CAPS capsule Take 1 capsule (50,000 Units total) by mouth 2 (two) times a week.   XARELTO  10 MG TABS tablet TAKE 1 TABLET BY MOUTH DAILY WITH SUPPER   doxycycline  (VIBRAMYCIN ) 100 MG capsule Take 1 capsule (100 mg total) by mouth 2 (two) times daily. (Patient not taking: Reported on 03/12/2024)   DULoxetine  (CYMBALTA ) 30 MG capsule Take 1 capsule (30 mg total) by mouth 2 (two) times daily. (Patient  not taking: Reported on 03/12/2024)   fluticasone  (FLONASE ) 50 MCG/ACT nasal spray Place 1 spray into both nostrils daily. (Patient not taking: Reported on 03/12/2024)   VITAMIN D  PO Take by mouth. (Patient not taking: Reported on 10/02/2023)   No facility-administered encounter medications on file as of 03/14/2024.    Allergies (verified) Lisinopril  and Latex   History: Past Medical History:  Diagnosis Date   Accidental methotrexate  overdose 01/08/2018   Acute lower UTI    Asthma    Bronchitis 03/19/2019   Cardiomegaly 05/08/2019   Chronic midline low back pain without sciatica 11/09/2016   Facet  joint arthropathy   Dehydration 03/19/2019   Depo-Provera  contraceptive status 08/11/2015   Depression 02/08/2017   Dysphagia 06/01/2018   Dysuria 04/19/2019   Elevated AST (SGOT) 03/19/2019   Essential hypertension 03/19/2019   Facial edema    Gout    Hypertension    IDA (iron deficiency anemia) 08/11/2015   Immunosuppressed status (HCC)    Insomnia 01/29/2016   Leukopenia 01/10/2018   Lobar pneumonia (HCC) 06/07/2018   Lupus    Lupus (systemic lupus erythematosus) (HCC) 03/19/2019   Metabolic acidosis 03/19/2019   Methotrexate  toxicity    Mouth ulcers    Mucositis oral 01/12/2018   Odynophagia 06/01/2018   Oral thrush 03/19/2019   Pleuritic chest pain 06/05/2018   Primary osteoarthritis of both knees 11/09/2016   Rash due to systemic lupus erythematosus (SLE) (HCC)    Raynaud disease    Raynaud's disease 08/11/2015   Relative acute adrenal insufficiency (HCC) 06/11/2018   Right leg DVT (HCC) 05/08/2019   Seasonal allergies 08/11/2015   Sepsis (HCC) 05/08/2019   Sinus tachycardia 03/19/2019   SLE (systemic lupus erythematosus) (HCC) 09/19/2016     Positive ANA, double-stranded DNA, Ro, RNP, RF, history of nasal ulcers, fatigue, anemia, arthritis, Raynauds   Thrush 06/01/2018   Vaginal candidiasis 01/12/2018   Vitamin D  deficiency 11/09/2016   Past Surgical History:  Procedure Laterality Date   ORIF SHOULDER FRACTURE Left    RENAL BIOPSY     TONSILLECTOMY     Family History  Problem Relation Age of Onset   Aneurysm Mother    Stroke Sister    Seizures Sister    Lung cancer Maternal Aunt    Heart attack Paternal Uncle    Colon cancer Neg Hx    Esophageal cancer Neg Hx    Pancreatic cancer Neg Hx    Stomach cancer Neg Hx    Social History   Socioeconomic History   Marital status: Single    Spouse name: Not on file   Number of children: Not on file   Years of education: Not on file   Highest education level: Not on file  Occupational History   Not on  file  Tobacco Use   Smoking status: Never   Smokeless tobacco: Never  Vaping Use   Vaping status: Never Used  Substance and Sexual Activity   Alcohol use: No   Drug use: No   Sexual activity: Not Currently  Other Topics Concern   Not on file  Social History Narrative   Not on file   Social Drivers of Health   Financial Resource Strain: Medium Risk (03/09/2023)   Overall Financial Resource Strain (CARDIA)    Difficulty of Paying Living Expenses: Somewhat hard  Food Insecurity: Low Risk  (11/23/2023)   Received from [facility]   Hunger Vital Sign    Within the past 12 months, you worried that your food  would run out before you got money to buy more: Never true    Within the past 12 months, the food you bought just didn't last and you didn't have money to get more. : Never true  Transportation Needs: No Transportation Needs (11/23/2023)   Received from [facility]   Transportation    In the past 12 months, has lack of reliable transportation kept you from medical appointments, meetings, work or from getting things needed for daily living? : No  Physical Activity: Insufficiently Active (03/09/2023)   Exercise Vital Sign    Days of Exercise per Week: 1 day    Minutes of Exercise per Session: 10 min  Stress: Stress Concern Present (03/09/2023)   [employer] of Occupational Health - Occupational Stress Questionnaire    Feeling of Stress : Very much  Social Connections: Socially Isolated (03/09/2023)   Social Connection and Isolation Panel    Frequency of Communication with Friends and Family: Once a week    Frequency of Social Gatherings with Friends and Family: Once a week    Attends Religious Services: Never    Database administrator or Organizations: No    Attends Banker Meetings: Never    Marital Status: Never married    Tobacco Counseling Counseling given: Not Answered   Clinical Intake:                        Activities of Daily  Living    03/12/2024   10:31 AM  In your present state of health, do you have any difficulty performing the following activities:  Hearing? 0  Vision? 1  Difficulty concentrating or making decisions? 1  Comment Patient stated that her memiry is getting bad, patient stated due to medications  Walking or climbing stairs? 0  Dressing or bathing? 0  Doing errands, shopping? 0  Preparing Food and eating ? N  Using the Toilet? N  In the past six months, have you accidently leaked urine? N  Do you have problems with loss of bowel control? Y  Comment patient stated that she started a new medication and it made her use the bathroom on herself  Managing your Medications? Y  Comment Patient stated that she take her medications only when she remembers  Managing your Finances? N  Housekeeping or managing your Housekeeping? N    Patient Care Team: Oley Bascom RAMAN, NP as PCP - General (Adult Health Nurse Practitioner) Nahser, Aleene PARAS, MD as PCP - Cardiology (Cardiology) Porter Andrez SAUNDERS, PA-C as Physician Assistant (Dermatology)  Indicate any recent Medical Services you may have received from other than [facility] providers in the past year (date may be approximate).     Assessment:   This is a routine wellness examination for [place].  Hearing/Vision screen No results found.   Goals Addressed               This Visit's Progress     Patient Stated (pt-stated)   Not on track     Lose weight.       Depression Screen    03/12/2024   10:40 AM 10/02/2023   10:16 AM 05/25/2023    1:35 PM 03/09/2023    9:12 AM 01/23/2023    3:39 PM 10/07/2022    2:50 PM 02/21/2022   11:39 AM  PHQ 2/9 Scores  PHQ - 2 Score 1 2 2 3 4 2  0  PHQ- 9 Score   11 14  9 6    Fall Risk    03/12/2024   10:38 AM 03/09/2023    9:12 AM 02/21/2022   11:29 AM 01/06/2022    2:12 PM 07/08/2021    3:06 PM  Fall Risk   Falls in the past year? 0 0 0 0 0  Number falls in past yr: 0 0 0 0 0  Injury with Fall? 0 0 0 0 0   Risk for fall due to : No Fall Risks No Fall Risks No Fall Risks No Fall Risks   Follow up Falls evaluation completed Falls prevention discussed Falls evaluation completed  Falls evaluation completed       Data saved with a previous flowsheet row definition    MEDICARE RISK AT HOME: Medicare Risk at Home Any stairs in or around the home?: Yes If so, are there any without handrails?: Yes Home free of loose throw rugs in walkways, pet beds, electrical cords, etc?: Yes Adequate lighting in your home to reduce risk of falls?: Yes Life alert?: No Use of a cane, walker or w/c?: Yes Grab bars in the bathroom?: Yes Shower chair or bench in shower?: No Elevated toilet seat or a handicapped toilet?: Yes  TIMED UP AND GO:    Cognitive Function:        03/12/2024   10:40 AM 03/09/2023    9:08 AM  6CIT Screen  What Year? 0 points 0 points  What month? 0 points 0 points  What time? 0 points 0 points  Count back from 20 0 points 0 points  Months in reverse 0 points 0 points  Repeat phrase 2 points 0 points  Total Score 2 points 0 points    Immunizations Immunization History  Administered Date(s) Administered   Influenza,inj,Quad PF,6+ Mos 08/11/2015, 07/27/2018, 05/17/2019, 07/08/2021   Pneumococcal Conjugate-13 05/13/2016, 05/28/2021   Pneumococcal Polysaccharide-23 05/29/2019   Td 10/02/2023    TDAP status: Up to date  Flu Vaccine status: Up to date  Pneumococcal vaccine status: Up to date  Covid-19 vaccine status: Information provided on how to obtain vaccines.   Qualifies for Shingles Vaccine? No   Zostavax completed No   Shingrix Completed?: No.    Education has been provided regarding the importance of this vaccine. Patient has been advised to call insurance company to determine out of pocket expense if they have not yet received this vaccine. Advised may also receive vaccine at local pharmacy or Health Dept. Verbalized acceptance and understanding.  Screening  Tests Health Maintenance  Topic Date Due   COVID-19 Vaccine (1) Never done   Hepatitis B Vaccines (1 of 3 - 19+ 3-dose series) Never done   HPV VACCINES (1 - Risk 3-dose SCDM series) Never done   INFLUENZA VACCINE  04/12/2024   Medicare Annual Wellness (AWV)  03/14/2025   Pneumococcal Vaccine 43-10 Years old (3 of 3 - PCV20 or PCV21) 05/28/2026   Cervical Cancer Screening (Pap smear)  09/19/2026   DTaP/Tdap/Td (2 - Tdap) 10/01/2033   Hepatitis C Screening  Completed   HIV Screening  Completed   Meningococcal B Vaccine  Aged Out    Health Maintenance  Health Maintenance Due  Topic Date Due   COVID-19 Vaccine (1) Never done   Hepatitis B Vaccines (1 of 3 - 19+ 3-dose series) Never done   HPV VACCINES (1 - Risk 3-dose SCDM series) Never done     Lung Cancer Screening: (Low Dose CT Chest recommended if Age 69-80 years, 20 pack-year currently smoking  OR have quit w/in 15years.) does not qualify.   Lung Cancer Screening Referral: Na  Additional Screening:  Hepatitis C Screening: does qualify; Completed 10/02/2023  Vision Screening: Recommended annual ophthalmology exams for early detection of glaucoma and other disorders of the eye. Is the patient up to date with their annual eye exam?  Yes  Who is the provider or what is the name of the office in which the patient attends annual eye exams? 01/29/2024 If pt is not established with a provider, would they like to be referred to a provider to establish care? No.   Dental Screening: Recommended annual dental exams for proper oral hygiene   Community Resource Referral / Chronic Care Management: CRR required this visit?  No   CCM required this visit?  Appt scheduled with PCP     Plan:     I have personally reviewed and noted the following in the patient's chart:   Medical and social history Use of alcohol, tobacco or illicit drugs  Current medications and supplements including opioid prescriptions. Patient is not currently  taking opioid prescriptions. Functional ability and status Nutritional status Physical activity Advanced directives List of other physicians Hospitalizations, surgeries, and ER visits in previous 12 months Vitals Screenings to include cognitive, depression, and falls Referrals and appointments  In addition, I have reviewed and discussed with patient certain preventive protocols, quality metrics, and best practice recommendations. A written personalized care plan for preventive services as well as general preventive health recommendations were provided to patient.     Jenna Ardoin D Sabrie Moritz, CMA   03/12/2024   After Visit Summary: (Mail) Due to this being a telephonic visit, the after visit summary with patients personalized plan was offered to patient via mail   Nurse Notes: Thanks for allowing me the time to speak with you today, have a great week

## 2024-03-14 ENCOUNTER — Ambulatory Visit: Payer: Self-pay

## 2024-03-14 VITALS — Ht 62.0 in | Wt 190.0 lb

## 2024-04-01 ENCOUNTER — Ambulatory Visit (INDEPENDENT_AMBULATORY_CARE_PROVIDER_SITE_OTHER): Payer: Self-pay | Admitting: Nurse Practitioner

## 2024-04-01 ENCOUNTER — Encounter: Payer: Self-pay | Admitting: Nurse Practitioner

## 2024-04-01 VITALS — BP 132/93 | HR 71 | Temp 98.4°F | Wt 184.6 lb

## 2024-04-01 DIAGNOSIS — Z13220 Encounter for screening for lipoid disorders: Secondary | ICD-10-CM

## 2024-04-01 DIAGNOSIS — Z0000 Encounter for general adult medical examination without abnormal findings: Secondary | ICD-10-CM | POA: Diagnosis not present

## 2024-04-01 MED ORDER — FAMOTIDINE 20 MG PO TABS: 20.0000 mg | ORAL_TABLET | Freq: Every day | ORAL | 0 refills | Status: AC

## 2024-04-01 NOTE — Patient Instructions (Signed)
 1. Lipid screening (Primary)  - Lipid Panel  2. Routine adult health maintenance  - CBC - Comprehensive metabolic panel with GFR

## 2024-04-01 NOTE — Progress Notes (Signed)
 Subjective   Patient ID: [name] [name], female    DOB: [birth year], 28 y.o.   MRN: [id number]  Chief Complaint  Patient presents with   Medical Management of Chronic Issues    Referring provider: Oley Bascom RAMAN, NP  [name] [name] is a 28 y.o. female with Past Medical History: 01/08/2018: Accidental methotrexate  overdose No date: Acute lower UTI No date: Asthma 03/19/2019: Bronchitis 05/08/2019: Cardiomegaly 11/09/2016: Chronic midline low back pain without sciatica     Comment:  Facet joint arthropathy 03/19/2019: Dehydration 08/11/2015: Depo-Provera  contraceptive status 02/08/2017: Depression 06/01/2018: Dysphagia 04/19/2019: Dysuria 03/19/2019: Elevated AST (SGOT) 03/19/2019: Essential hypertension No date: Facial edema No date: Gout No date: Hypertension 08/11/2015: IDA (iron deficiency anemia) No date: Immunosuppressed status (HCC) 01/29/2016: Insomnia 01/10/2018: Leukopenia 06/07/2018: Lobar pneumonia (HCC) No date: Lupus 03/19/2019: Lupus (systemic lupus erythematosus) (HCC) 03/19/2019: Metabolic acidosis No date: Methotrexate  toxicity No date: Mouth ulcers 01/12/2018: Mucositis oral 06/01/2018: Odynophagia 03/19/2019: Oral thrush 06/05/2018: Pleuritic chest pain 11/09/2016: Primary osteoarthritis of both knees No date: Rash due to systemic lupus erythematosus (SLE) (HCC) No date: Raynaud disease 08/11/2015: Raynaud's disease 06/11/2018: Relative acute adrenal insufficiency (HCC) 05/08/2019: Right leg DVT (HCC) 08/11/2015: Seasonal allergies 05/08/2019: Sepsis (HCC) 03/19/2019: Sinus tachycardia 09/19/2016: SLE (systemic lupus erythematosus) (HCC)     Comment:    Positive ANA, double-stranded DNA, Ro, RNP, RF,               history of nasal ulcers, fatigue, anemia, arthritis,               Raynauds 06/01/2018: Thrush 01/12/2018: Vaginal candidiasis 11/09/2016: Vitamin D  deficiency  HPI  Patient presents today for follow-up visit.  She is  currently followed by rheumatology and nephrology.  She did recently have blood work done.  She is due for labs today. Denies f/c/s, n/v/d, hemoptysis, PND, leg swelling. Denies chest pain or edema.     Allergies  Allergen Reactions   Lisinopril  Other (See Comments)     Angioedema    Latex Rash    Immunization History  Administered Date(s) Administered   Influenza,inj,Quad PF,6+ Mos 08/11/2015, 07/27/2018, 05/17/2019, 07/08/2021   Pneumococcal Conjugate-13 05/13/2016, 05/28/2021   Pneumococcal Polysaccharide-23 05/29/2019   Td 10/02/2023    Tobacco History: Social History   Tobacco Use  Smoking Status Never  Smokeless Tobacco Never   Counseling given: Not Answered   Outpatient Encounter Medications as of 04/01/2024  Medication Sig   etonogestrel (NEXPLANON) 68 MG IMPL implant 68 mg by Subdermal route continuous.   hydroxychloroquine (PLAQUENIL) 200 MG tablet Take 100-200 mg by mouth See admin instructions. Takes 1 tablet in the morning and 1/2 tablet at night.   losartan (COZAAR) 25 MG tablet Take 25 mg by mouth daily. Take once daily at  bedtime   magnesium  (MAGTAB) 84 MG ( ) TBCR SR tablet Take 84 mg by mouth daily.   mirtazapine  (REMERON ) 15 MG tablet Take 1 tablet (15 mg total) by mouth daily.   mupirocin  ointment (BACTROBAN ) 2 % Apply 1 Application topically 2 (two) times daily.   omeprazole  (PRILOSEC) 40 MG capsule TAKE 1 CAPSULE (40 MG TOTAL) BY MOUTH DAILY.   PERIDEX  0.12 % solution 15 mLs 2 (two) times daily.   triamcinolone  cream (KENALOG ) 0.1 % Apply to AA daily and follow up in 30 days   Vitamin D , Ergocalciferol , (DRISDOL ) 50000 units CAPS capsule Take 1 capsule (50,000 Units total) by mouth 2 (two) times a week.   XARELTO  10 MG TABS tablet TAKE 1  TABLET BY MOUTH DAILY WITH SUPPER   [DISCONTINUED] famotidine  (PEPCID ) 20 MG tablet Take 1 tablet (20 mg total) by mouth daily.   doxycycline  (VIBRAMYCIN ) 100 MG capsule Take 1 capsule (100 mg total) by mouth 2  (two) times daily. (Patient not taking: Reported on 03/12/2024)   DULoxetine  (CYMBALTA ) 30 MG capsule Take 1 capsule (30 mg total) by mouth 2 (two) times daily. (Patient not taking: Reported on 03/12/2024)   famotidine  (PEPCID ) 20 MG tablet Take 1 tablet (20 mg total) by mouth daily.   fluticasone  (FLONASE ) 50 MCG/ACT nasal spray Place 1 spray into both nostrils daily. (Patient not taking: Reported on 03/12/2024)   folic acid  (FOLVITE ) 1 MG tablet Take 1 tablet (1 mg total) by mouth at bedtime.   mycophenolate  (CELLCEPT ) 500 MG tablet Take 1,500 mg by mouth 2 (two) times daily.   Suvorexant  (BELSOMRA ) 5 MG TABS Take 5 mg by mouth at bedtime as needed.   tacrolimus  (PROGRAF ) 1 MG capsule Take by mouth.   VITAMIN D  PO Take by mouth. (Patient not taking: Reported on 10/02/2023)   No facility-administered encounter medications on file as of 04/01/2024.    Review of Systems  Review of Systems  Constitutional: Negative.   HENT: Negative.    Cardiovascular: Negative.   Gastrointestinal: Negative.   Allergic/Immunologic: Negative.   Neurological: Negative.   Psychiatric/Behavioral: Negative.       Objective:   BP (!) 132/93   Pulse 71   Temp 98.4 F (36.9 C) (Oral)   Wt 184 lb 9.6 oz (83.7 kg)   SpO2 100%   BMI 33.76 kg/m   Wt Readings from Last 5 Encounters:  04/01/24 184 lb 9.6 oz (83.7 kg)  03/12/24 190 lb (86.2 kg)  10/02/23 192 lb (87.1 kg)  05/25/23 195 lb (88.5 kg)  05/24/23 192 lb 12.8 oz (87.5 kg)     Physical Exam Vitals and nursing note reviewed.  Constitutional:      General: She is not in acute distress.    Appearance: She is well-developed.  Cardiovascular:     Rate and Rhythm: Normal rate and regular rhythm.  Pulmonary:     Effort: Pulmonary effort is normal.     Breath sounds: Normal breath sounds.  Neurological:     Mental Status: She is alert and oriented to person, place, and time.       Assessment & Plan:   Lipid screening -     Lipid  panel  Routine adult health maintenance -     CBC -     Comprehensive metabolic panel with GFR  Other orders -     Famotidine ; Take 1 tablet (20 mg total) by mouth daily.  Dispense: 30 tablet; Refill: 0     Return in about 6 months (around 10/02/2024).     Bascom GORMAN Borer, NP 04/01/2024

## 2024-04-02 LAB — COMPREHENSIVE METABOLIC PANEL WITH GFR
ALT: 8 IU/L (ref 0–32)
AST: 15 IU/L (ref 0–40)
Albumin: 3.8 g/dL — ABNORMAL LOW (ref 4.0–5.0)
Alkaline Phosphatase: 81 IU/L (ref 44–121)
BUN/Creatinine Ratio: 9 (ref 9–23)
BUN: 32 mg/dL — ABNORMAL HIGH (ref 6–20)
Bilirubin Total: 0.2 mg/dL (ref 0.0–1.2)
CO2: 18 mmol/L — ABNORMAL LOW (ref 20–29)
Calcium: 9.3 mg/dL (ref 8.7–10.2)
Chloride: 104 mmol/L (ref 96–106)
Creatinine, Ser: 3.5 mg/dL — ABNORMAL HIGH (ref 0.57–1.00)
Globulin, Total: 3.8 g/dL (ref 1.5–4.5)
Glucose: 77 mg/dL (ref 70–99)
Potassium: 5.5 mmol/L — ABNORMAL HIGH (ref 3.5–5.2)
Sodium: 136 mmol/L (ref 134–144)
Total Protein: 7.6 g/dL (ref 6.0–8.5)
eGFR: 17 mL/min/1.73 — ABNORMAL LOW (ref >59)

## 2024-04-02 LAB — CBC
Hematocrit: 35.9 % (ref 34.0–46.6)
Hemoglobin: 11 g/dL — ABNORMAL LOW (ref 11.1–15.9)
MCH: 27.6 pg (ref 26.6–33.0)
MCHC: 30.6 g/dL — ABNORMAL LOW (ref 31.5–35.7)
MCV: 90 fL (ref 79–97)
Platelets: 308 x10E3/uL (ref 150–450)
RBC: 3.99 x10E6/uL (ref 3.77–5.28)
RDW: 13.2 % (ref 11.7–15.4)
WBC: 7.2 x10E3/uL (ref 3.4–10.8)

## 2024-04-02 LAB — LIPID PANEL
Chol/HDL Ratio: 4.3 ratio (ref 0.0–4.4)
Cholesterol, Total: 169 mg/dL (ref 100–199)
HDL: 39 mg/dL — ABNORMAL LOW (ref >39)
LDL Chol Calc (NIH): 108 mg/dL — ABNORMAL HIGH (ref 0–99)
Triglycerides: 121 mg/dL (ref 0–149)
VLDL Cholesterol Cal: 22 mg/dL (ref 5–40)

## 2024-04-03 ENCOUNTER — Ambulatory Visit: Payer: Self-pay | Admitting: Nurse Practitioner

## 2024-05-06 ENCOUNTER — Emergency Department (HOSPITAL_COMMUNITY)
Admission: EM | Admit: 2024-05-06 | Discharge: 2024-05-06 | Disposition: A | Discharge status: Home / Self Care | Attending: Emergency Medicine | Admitting: Emergency Medicine

## 2024-05-06 DIAGNOSIS — Z7901 Long term (current) use of anticoagulants: Secondary | ICD-10-CM | POA: Insufficient documentation

## 2024-05-06 DIAGNOSIS — R2232 Localized swelling, mass and lump, left upper limb: Secondary | ICD-10-CM | POA: Insufficient documentation

## 2024-05-06 DIAGNOSIS — Z91040 Latex allergy status: Secondary | ICD-10-CM | POA: Insufficient documentation

## 2024-05-06 DIAGNOSIS — N189 Chronic kidney disease, unspecified: Secondary | ICD-10-CM | POA: Diagnosis not present

## 2024-05-06 LAB — CBC WITH DIFFERENTIAL/PLATELET
Abs Immature Granulocytes: 0.02 K/uL (ref 0.00–0.07)
Basophils Absolute: 0 K/uL (ref 0.0–0.1)
Basophils Relative: 1 %
Eosinophils Absolute: 0.2 K/uL (ref 0.0–0.5)
Eosinophils Relative: 2 %
HCT: 32.7 % — ABNORMAL LOW (ref 36.0–46.0)
Hemoglobin: 9.9 g/dL — ABNORMAL LOW (ref 12.0–15.0)
Immature Granulocytes: 0 %
Lymphocytes Relative: 19 %
Lymphs Abs: 1.5 K/uL (ref 0.7–4.0)
MCH: 26.4 pg (ref 26.0–34.0)
MCHC: 30.3 g/dL (ref 30.0–36.0)
MCV: 87.2 fL (ref 80.0–100.0)
Monocytes Absolute: 0.8 K/uL (ref 0.1–1.0)
Monocytes Relative: 10 %
Neutro Abs: 5.3 K/uL (ref 1.7–7.7)
Neutrophils Relative %: 68 %
Platelets: 356 K/uL (ref 150–400)
RBC: 3.75 MIL/uL — ABNORMAL LOW (ref 3.87–5.11)
RDW: 12.9 % (ref 11.5–15.5)
WBC: 7.8 K/uL (ref 4.0–10.5)
nRBC: 0 % (ref 0.0–0.2)

## 2024-05-06 LAB — COMPREHENSIVE METABOLIC PANEL WITH GFR
ALT: 14 U/L (ref 0–44)
AST: 18 U/L (ref 15–41)
Albumin: 3.4 g/dL — ABNORMAL LOW (ref 3.5–5.0)
Alkaline Phosphatase: 75 U/L (ref 38–126)
Anion gap: 12 (ref 5–15)
BUN: 40 mg/dL — ABNORMAL HIGH (ref 6–20)
CO2: 21 mmol/L — ABNORMAL LOW (ref 22–32)
Calcium: 8.9 mg/dL (ref 8.9–10.3)
Chloride: 105 mmol/L (ref 98–111)
Creatinine, Ser: 4.12 mg/dL — ABNORMAL HIGH (ref 0.44–1.00)
GFR, Estimated: 14 mL/min — ABNORMAL LOW (ref >60)
Glucose, Bld: 86 mg/dL (ref 70–99)
Potassium: 4.9 mmol/L (ref 3.5–5.1)
Sodium: 138 mmol/L (ref 135–145)
Total Bilirubin: 0.4 mg/dL (ref 0.0–1.2)
Total Protein: 8.2 g/dL — ABNORMAL HIGH (ref 6.5–8.1)

## 2024-05-06 LAB — URINALYSIS, ROUTINE W REFLEX MICROSCOPIC
Bacteria, UA: NONE SEEN
Bilirubin Urine: NEGATIVE
Glucose, UA: NEGATIVE mg/dL
Ketones, ur: NEGATIVE mg/dL
Nitrite: NEGATIVE
Protein, ur: 100 mg/dL — AB
RBC / HPF: >50 RBC/hpf (ref 0–5)
Specific Gravity, Urine: 1.01 (ref 1.005–1.030)
WBC, UA: >50 WBC/hpf (ref 0–5)
pH: 6 (ref 5.0–8.0)

## 2024-05-06 NOTE — ED Provider Notes (Signed)
 [place] EMERGENCY DEPARTMENT AT [facility] Provider Note   CSN: [id number] Arrival date & time: 05/06/24  1614     Patient presents with: Arm Injury (Thinks she may have blood clot)   [name] [name] is a 28 y.o. female.   This is a pleasant 28 year old female with history of lupus on hydroxychloroquine, anemia, chronic kidney disease currently in the process of getting set up for dialysis, who is here today with a dark discoloration on her left forearm and a small bump Neath it.  She is on Xarelto  for prior blood clot, was concerned that she may have a blood clot today.   Arm Injury      Prior to Admission medications   Medication Sig Start Date End Date Taking? Authorizing Provider  doxycycline  (VIBRAMYCIN ) 100 MG capsule Take 1 capsule (100 mg total) by mouth 2 (two) times daily. Patient not taking: Reported on 03/12/2024 11/30/23   Janet Lonni BRAVO, MD  DULoxetine  (CYMBALTA ) 30 MG capsule Take 1 capsule (30 mg total) by mouth 2 (two) times daily. Patient not taking: Reported on 03/12/2024 09/16/21   Parsons, Brittney E, NP  etonogestrel (NEXPLANON) 68 MG IMPL implant 68 mg by Subdermal route continuous. 06/25/20 04/01/24  [provider]  famotidine  (PEPCID ) 20 MG tablet Take 1 tablet (20 mg total) by mouth daily. 04/01/24   Nichols, Tonya S, NP  fluticasone  (FLONASE ) 50 MCG/ACT nasal spray Place 1 spray into both nostrils daily. Patient not taking: Reported on 03/12/2024 11/30/23   Janet Lonni BRAVO, MD  folic acid  (FOLVITE ) 1 MG tablet Take 1 tablet (1 mg total) by mouth at bedtime. 03/25/19   Shona Terry SAILOR, DO  hydroxychloroquine (PLAQUENIL) 200 MG tablet Take 100-200 mg by mouth See admin instructions. Takes 1 tablet in the morning and 1/2 tablet at night.    [provider]  losartan (COZAAR) 25 MG tablet Take 25 mg by mouth daily. Take once daily at  bedtime    [provider]  magnesium  (MAGTAB) 84 MG ( ) TBCR SR tablet Take 84 mg by  mouth daily. 11/23/21   [provider]  mirtazapine  (REMERON ) 15 MG tablet Take 1 tablet (15 mg total) by mouth daily. 10/02/23   Oley Bascom RAMAN, NP  mupirocin  ointment (BACTROBAN ) 2 % Apply 1 Application topically 2 (two) times daily. 03/31/23   Oley Bascom RAMAN, NP  mycophenolate  (CELLCEPT ) 500 MG tablet Take 1,500 mg by mouth 2 (two) times daily. 01/22/21   [provider]  omeprazole  (PRILOSEC) 40 MG capsule TAKE 1 CAPSULE (40 MG TOTAL) BY MOUTH DAILY. 02/19/24   Nichols, Tonya S, NP  PERIDEX  0.12 % solution 15 mLs 2 (two) times daily. 11/10/21   [provider]  Suvorexant  (BELSOMRA ) 5 MG TABS Take 5 mg by mouth at bedtime as needed. 09/16/21   Harl Zane BRAVO, NP  tacrolimus  (PROGRAF ) 1 MG capsule Take by mouth. 07/02/21   [provider]  triamcinolone  cream (KENALOG ) 0.1 % Apply to AA daily and follow up in 30 days 02/16/22   Sheffield, Kelli R, PA-C  VITAMIN D  PO Take by mouth. Patient not taking: Reported on 10/02/2023    [provider]  Vitamin D , Ergocalciferol , (DRISDOL ) 50000 units CAPS capsule Take 1 capsule (50,000 Units total) by mouth 2 (two) times a week. 12/21/17   Dolphus Reiter, MD  XARELTO  10 MG TABS tablet TAKE 1 TABLET BY MOUTH DAILY WITH SUPPER 10/26/23   Timmy Maude SAUNDERS, MD  Allergies: Lisinopril  and Latex    Review of Systems  Updated Vital Signs BP (!) 145/99 (BP Location: Left Arm)   Pulse 74 Comment: Pt has raynayds  Temp 98.5 F (36.9 C) (Oral)   Ht 5' 2 (1.575 m)   Wt 86.2 kg   SpO2 (!) 81% Comment: RN aware of situation  BMI 34.75 kg/m   Physical Exam Vitals and nursing note reviewed.  Musculoskeletal:     Cervical back: Normal range of motion.  Skin:    General: Skin is warm and dry.     Comments: Quarter sized area of discoloration on the volar left forearm.  There is a small mobile, pocket beneath.  No tenderness, no pus, no purulence.  Neurological:     General: No focal deficit present.      (all labs ordered are listed, but only abnormal results are displayed) Labs Reviewed  CBC WITH DIFFERENTIAL/PLATELET - Abnormal; Notable for the following components:      Result Value   RBC 3.75 (*)    Hemoglobin 9.9 (*)    HCT 32.7 (*)    All other components within normal limits  COMPREHENSIVE METABOLIC PANEL WITH GFR - Abnormal; Notable for the following components:   CO2 21 (*)    BUN 40 (*)    Creatinine, Ser 4.12 (*)    Total Protein 8.2 (*)    Albumin 3.4 (*)    GFR, Estimated 14 (*)    All other components within normal limits  URINALYSIS, ROUTINE W REFLEX MICROSCOPIC  CBG MONITORING, ED    EKG: None  Radiology: No results found.   Procedures   Medications Ordered in the ED - No data to display                                  Medical Decision Making 28 year old female here today for an area of discoloration on her left forearm.  Plan -perform an ultrasound on the area, no cobblestoning, does not appear to be an abscess.  Feels like a simple cyst.  Did a limited ultrasound of the patient's upper extremity did not observe a definitive DVT.  Informed patient that we did not have DVT imaging available today, but that she would be able to return to the ED tomorrow for ultrasound imaging.  Ultimately, seems unlikely to be DVT.  I think that the skin discoloration is related to hydroxychloroquine.  Possibly a small simple cyst.  Advised the patient that if it was an infection, will become apparent in the next couple of days.  Gave the patient strict return precaution at bedside.  This patient's health care is complicated by the following social determinants of health-chronic kidney disease, lupus, anticoagulated status.        Final diagnoses:  Lump of skin of left upper extremity    ED Discharge Orders     None          Mannie Fairy DASEN, DO 05/06/24 1931

## 2024-05-06 NOTE — ED Provider Triage Note (Signed)
 Emergency Medicine Provider Triage Evaluation Note  [name] [name] , a 28 y.o. female  was evaluated in triage.  Pt complains of LUE nodule. Mildly tender with palpation. No known injury. On xarelto  10mg  for hx of DVT  Also feels dizziness with standing. Hx of low iron. Last infusion for iron last yr. Does not take po supplementation. No head injury, syncope, blurred vision  Review of Systems  Positive: See hpi Negative: fevers  Physical Exam  BP (!) 145/99 (BP Location: Left Arm)   Pulse 74 Comment: Pt has raynayds  Temp 98.5 F (36.9 C) (Oral)   Ht 5' 2 (1.575 m)   Wt 86.2 kg   SpO2 (!) 81% Comment: RN aware of situation  BMI 34.75 kg/m  Gen:   Awake, no distress   Resp:  Normal effort  MSK:   Moves extremities without difficulty  Other:  Mildly tender nonfluctuant nodule to LUE with overlying darkening. Radial 2+. No significant swelling to LUE.  Medical Decision Making  Medically screening exam initiated at 5:33 PM.  Appropriate orders placed.  Gloris Shiroma Hemler was informed that the remainder of the evaluation will be completed by another provider, this initial triage assessment does not replace that evaluation, and the importance of remaining in the ED until their evaluation is complete.  Labs ordered   Minnie Tinnie BRAVO, [place] 05/06/24 [phone]

## 2024-05-06 NOTE — ED Triage Notes (Signed)
 Arrived POV from home. Patient reports bruising of unknown cause on left posterior forearm. Patient also reports history of blood clot in her leg approximately 1 1/2 years ago. Patient reports dizziness upon standing that is different from low iron.

## 2024-05-06 NOTE — Discharge Instructions (Signed)
 Like we discussed, you can return to the emergency room tomorrow for a formal ultrasound of your arm.  I do not see a blood clot on my ultrasound, but that is not as good as the formal ultrasound that we can get.  I did not see any evidence of infection, but if there is infection, it should become obvious in the next couple of days.  Return to the emergency room if you develop worsening pain, swelling warmth or redness in that area.  Follow-up with your primary care doctor.  Continue taking all medications as prescribed.

## 2024-05-07 ENCOUNTER — Telehealth: Payer: Self-pay

## 2024-05-07 NOTE — Transitions of Care (Post Inpatient/ED Visit) (Signed)
 05/07/2024  Name: [name] [name] MRN: [id number] DOB: 12-03-1995  Today's TOC FU Call Status:   Patient's Name and Date of Birth confirmed.  Transition Care Management Follow-up Telephone Call Date of Discharge: 05/06/24 Discharge Facility: [facility]) Type of Discharge: Emergency Department How have you been since you were released from the hospital?: Better Any questions or concerns?: No  Items Reviewed: Did you receive and understand the discharge instructions provided?: Yes Medications obtained,verified, and reconciled?: Yes (Medications Reviewed) Any new allergies since your discharge?: No Dietary orders reviewed?: NA Do you have support at home?: Yes People in Home [RPT]: parent(s), sibling(s)  Medications Reviewed Today: Medications Reviewed Today     Reviewed by Starlene Charlynn BIRCH, CMA (Certified Medical Assistant) on 05/07/24 at 1220  Med List Status: <None>   Medication Order Taking? Sig Documenting Provider Last Dose Status Informant  doxycycline  (VIBRAMYCIN ) 100 MG capsule [id number]  Take 1 capsule (100 mg total) by mouth 2 (two) times daily.  Patient not taking: Reported on 03/12/2024   [name] Lonni BRAVO, MD  Active   DULoxetine  (CYMBALTA ) 30 MG capsule [id number]  Take 1 capsule (30 mg total) by mouth 2 (two) times daily.  Patient not taking: Reported on 03/12/2024   Harl Zane BRAVO, NP  Active   etonogestrel (NEXPLANON) 68 MG IMPL implant [id number] Yes 68 mg by Subdermal route continuous. [provider]  Active Self           Med Note [name]   Tue May 18, 2021  9:05 PM) Year 2021   famotidine  (PEPCID ) 20 MG tablet [id number] Yes Take 1 tablet (20 mg total) by mouth daily. Nichols, Tonya S, NP  Active   fluticasone  (FLONASE ) 50 MCG/ACT nasal spray [id number]  Place 1 spray into both nostrils daily.  Patient not taking: Reported on 03/12/2024   [name] Lonni BRAVO, MD  Active   folic acid  (FOLVITE ) 1 MG tablet [id number] Yes Take 1  tablet (1 mg total) by mouth at bedtime. [name] Terry SAILOR, DO  Active   hydroxychloroquine (PLAQUENIL) 200 MG tablet [id number] Yes Take 100-200 mg by mouth See admin instructions. Takes 1 tablet in the morning and 1/2 tablet at night. [provider]  Active Self  losartan (COZAAR) 25 MG tablet [id number] Yes Take 25 mg by mouth daily. Take once daily at  bedtime [provider]  Active Self  magnesium  (MAGTAB) 84 MG ( ) TBCR SR tablet [id number] Yes Take 84 mg by mouth daily. [provider]  Active   mirtazapine  (REMERON ) 15 MG tablet [id number] Yes Take 1 tablet (15 mg total) by mouth daily. [name] Bascom RAMAN, NP  Active   mupirocin  ointment (BACTROBAN ) 2 % [id number] Yes Apply 1 Application topically 2 (two) times daily. [name] Bascom RAMAN, NP  Active   mycophenolate  (CELLCEPT ) 500 MG tablet [id number] Yes Take 1,500 mg by mouth 2 (two) times daily. [provider]  Active Self  omeprazole  (PRILOSEC) 40 MG capsule [id number] Yes TAKE 1 CAPSULE (40 MG TOTAL) BY MOUTH DAILY. [name] Bascom RAMAN, NP  Active   PERIDEX  0.12 % solution [id number] Yes 15 mLs 2 (two) times daily. [provider]  Active   Suvorexant  (BELSOMRA ) 5 MG TABS [id number] Yes Take 5 mg by mouth at bedtime as needed. Harl Zane E, NP  Active   tacrolimus  (PROGRAF ) 1 MG capsule [id number] Yes Take by mouth. [provider]  Active   triamcinolone  cream (KENALOG ) 0.1 % [id number] Yes Apply to AA daily and  follow up in 30 days Sheffield, Kelli R, PA-C  Active   VITAMIN D  PO [id number]  Take by mouth.  Patient not taking: Reported on 10/02/2023   [provider]  Active   Vitamin D , Ergocalciferol , (DRISDOL ) 50000 units CAPS capsule [id number] Yes Take 1 capsule (50,000 Units total) by mouth 2 (two) times a week. [name] Reiter, MD  Active            Med Note [name], ASHELEY [name]   Tue May 28, 2019  1:47 PM)    XARELTO  10 MG TABS tablet [id number] Yes TAKE 1 TABLET BY MOUTH DAILY  WITH SUPPER Ennever, Maude SAUNDERS, MD  Active             Home Care and Equipment/Supplies: Were Home Health Services Ordered?: NA Any new equipment or medical supplies ordered?: NA  Functional Questionnaire: Do you need assistance with bathing/showering or dressing?: No Do you need assistance with meal preparation?: No Do you need assistance with eating?: No Do you have difficulty maintaining continence: No Do you need assistance with getting out of bed/getting out of a chair/moving?: No Do you have difficulty managing or taking your medications?: No  Follow up appointments reviewed: Specialist Hospital Follow-up appointment confirmed?: NA Do you need transportation to your follow-up appointment?: No Do you understand care options if your condition(s) worsen?: Yes-patient verbalized understanding    SIGNATURE Dell Briner, RMA

## 2024-05-17 ENCOUNTER — Inpatient Hospital Stay: Payer: Self-pay | Admitting: Nurse Practitioner

## 2024-07-06 ENCOUNTER — Other Ambulatory Visit: Payer: Self-pay | Admitting: Nurse Practitioner

## 2024-07-08 NOTE — Telephone Encounter (Signed)
 Please advise [facility]

## 2024-08-05 ENCOUNTER — Other Ambulatory Visit: Payer: Self-pay | Admitting: Nurse Practitioner

## 2024-08-05 DIAGNOSIS — K219 Gastro-esophageal reflux disease without esophagitis: Secondary | ICD-10-CM

## 2024-08-29 ENCOUNTER — Emergency Department (HOSPITAL_COMMUNITY)
Admission: EM | Admit: 2024-08-29 | Discharge: 2024-08-29 | Disposition: A | Discharge status: Home / Self Care | Source: Home / Self Care | Attending: Emergency Medicine | Admitting: Emergency Medicine

## 2024-08-29 ENCOUNTER — Other Ambulatory Visit: Payer: Self-pay

## 2024-08-29 ENCOUNTER — Emergency Department (HOSPITAL_COMMUNITY)

## 2024-08-29 DIAGNOSIS — N189 Chronic kidney disease, unspecified: Secondary | ICD-10-CM | POA: Insufficient documentation

## 2024-08-29 DIAGNOSIS — Z7901 Long term (current) use of anticoagulants: Secondary | ICD-10-CM | POA: Insufficient documentation

## 2024-08-29 DIAGNOSIS — Z91040 Latex allergy status: Secondary | ICD-10-CM | POA: Insufficient documentation

## 2024-08-29 DIAGNOSIS — J029 Acute pharyngitis, unspecified: Secondary | ICD-10-CM | POA: Diagnosis not present

## 2024-08-29 DIAGNOSIS — Z79899 Other long term (current) drug therapy: Secondary | ICD-10-CM | POA: Insufficient documentation

## 2024-08-29 DIAGNOSIS — R509 Fever, unspecified: Secondary | ICD-10-CM | POA: Diagnosis present

## 2024-08-29 LAB — RESP PANEL BY RT-PCR (RSV, FLU A&B, COVID)  RVPGX2
Influenza A by PCR: NEGATIVE
Influenza B by PCR: NEGATIVE
Resp Syncytial Virus by PCR: NEGATIVE
SARS Coronavirus 2 by RT PCR: NEGATIVE

## 2024-08-29 LAB — GROUP A STREP BY PCR: Group A Strep by PCR: NOT DETECTED

## 2024-08-29 MED ORDER — AMOXICILLIN 500 MG PO CAPS
1000.0000 mg | ORAL_CAPSULE | Freq: Once | ORAL | Status: AC
  Administered 2024-08-29: 14:00:00 1000 mg via ORAL
  Filled 2024-08-29: qty 2

## 2024-08-29 MED ORDER — AMOXICILLIN 500 MG PO CAPS: 1000.0000 mg | ORAL_CAPSULE | Freq: Every day | ORAL | 0 refills | Status: AC

## 2024-08-29 MED ADMIN — Dexamethasone Sod Phosphate Preservative Free Inj 10 MG/ML: 10 mg | INTRAMUSCULAR | @ 14:00:00 | NDC 72485011801

## 2024-08-29 NOTE — ED Triage Notes (Addendum)
 Patient reports sore throat/headache/fever since Saturday. Patient is alert and oriented x 4. Airway patent, respirations even and unlabored. Skin normal, warm and dry. Fistula to LUE placed 1 month ago, denies redness/swelling/pain at site.

## 2024-08-29 NOTE — ED Provider Notes (Signed)
**Note [name]-Identified via Obfuscation**  [name] [name] Provider Note   CSN: [id number] Arrival date & time: 08/29/24  9046     Patient presents with: Sore Throat   [name] [name] is a 28 y.o. female.   Patient here with sore throat body aches fever for the last several days.  Been ongoing for about 5 days.  She has history of lupus and chronic kidney disease.  She has had a cough with some little sputum production.  She denies any abdominal pain neck pain rash.  She is feeling congested in the nose and soreness in the throat.  Has been able to eat and drink without any issues.  The history is provided by the patient.       Prior to Admission medications  Medication Sig Start Date End Date Taking? Authorizing Provider  amoxicillin  (AMOXIL ) 500 MG capsule Take 2 capsules (1,000 mg total) by mouth daily for 9 days. 08/29/24 09/07/24 Yes Sharlisa Hollifield, DO  methylPREDNISolone  (MEDROL  DOSEPAK) 4 MG TBPK tablet Follow package insert 08/29/24  Yes Ziere Docken, DO  doxycycline  (VIBRAMYCIN ) 100 MG capsule Take 1 capsule (100 mg total) by mouth 2 (two) times daily. Patient not taking: Reported on 03/12/2024 11/30/23   Janet Lonni BRAVO, MD  DULoxetine  (CYMBALTA ) 30 MG capsule Take 1 capsule (30 mg total) by mouth 2 (two) times daily. Patient not taking: Reported on 03/12/2024 09/16/21   Parsons, Brittney E, NP  etonogestrel (NEXPLANON) 68 MG IMPL implant 68 mg by Subdermal route continuous. 06/25/20 05/07/24  [provider]  famotidine  (PEPCID ) 20 MG tablet Take 1 tablet (20 mg total) by mouth daily. 04/01/24   Oley Bascom RAMAN, NP  fluticasone  (FLONASE ) 50 MCG/ACT nasal spray Place 1 spray into both nostrils daily. Patient not taking: Reported on 03/12/2024 11/30/23   Janet Lonni BRAVO, MD  folic acid  (FOLVITE ) 1 MG tablet Take 1 tablet (1 mg total) by mouth at bedtime. 03/25/19   Shona Terry SAILOR, DO  hydroxychloroquine (PLAQUENIL) 200 MG tablet Take 100-200 mg by mouth See admin  instructions. Takes 1 tablet in the morning and 1/2 tablet at night.    [provider]  losartan (COZAAR) 25 MG tablet Take 25 mg by mouth daily. Take once daily at  bedtime    [provider]  magnesium  (MAGTAB) 84 MG ( ) TBCR SR tablet Take 84 mg by mouth daily. 11/23/21   [provider]  mirtazapine  (REMERON ) 15 MG tablet TAKE 1 TABLET (15 MG TOTAL) BY MOUTH DAILY. 07/08/24   Oley Bascom RAMAN, NP  mupirocin  ointment (BACTROBAN ) 2 % Apply 1 Application topically 2 (two) times daily. 03/31/23   Oley Bascom RAMAN, NP  mycophenolate  (CELLCEPT ) 500 MG tablet Take 1,500 mg by mouth 2 (two) times daily. 01/22/21   [provider]  omeprazole  (PRILOSEC) 40 MG capsule TAKE 1 CAPSULE (40 MG TOTAL) BY MOUTH DAILY. 08/06/24   Oley Bascom RAMAN, NP  PERIDEX  0.12 % solution 15 mLs 2 (two) times daily. 11/10/21   [provider]  Suvorexant  (BELSOMRA ) 5 MG TABS Take 5 mg by mouth at bedtime as needed. 09/16/21   Harl Zane BRAVO, NP  tacrolimus  (PROGRAF ) 1 MG capsule Take by mouth. 07/02/21   [provider]  triamcinolone  cream (KENALOG ) 0.1 % Apply to AA daily and follow up in 30 days 02/16/22   Sheffield, Kelli R, PA-C  VITAMIN D  PO Take by mouth. Patient not taking: Reported on 10/02/2023    [provider]  Vitamin  D, Ergocalciferol , (DRISDOL ) 50000 units CAPS capsule Take 1 capsule (50,000 Units total) by mouth 2 (two) times a week. 12/21/17   Dolphus Reiter, MD  XARELTO  10 MG TABS tablet TAKE 1 TABLET BY MOUTH DAILY WITH SUPPER 10/26/23   Timmy Maude SAUNDERS, MD    Allergies: Lisinopril  and Latex    Review of Systems  Updated Vital Signs BP (!) 140/99   Pulse 94   Temp 98.6 F (37 C) (Oral)   Resp 17   Ht 5' 2 (1.575 m)   Wt 81.6 kg   LMP 08/22/2024   SpO2 100%   BMI 32.92 kg/m   Physical Exam Vitals and nursing note reviewed.  Constitutional:      General: She is not in acute distress.    Appearance: She is well-developed.  She is not ill-appearing.  HENT:     Head: Normocephalic and atraumatic.     Nose: Congestion present.     Mouth/Throat:     Mouth: Mucous membranes are moist. No oral lesions.     Pharynx: Uvula midline. Posterior oropharyngeal erythema present. No oropharyngeal exudate or uvula swelling.     Tonsils: No tonsillar exudate.     Comments: Cobblestoning to the posterior oropharynx Eyes:     Extraocular Movements:     Right eye: Normal extraocular motion.     Left eye: Normal extraocular motion.     Conjunctiva/sclera: Conjunctivae normal.     Pupils: Pupils are equal, round, and reactive to light.  Cardiovascular:     Rate and Rhythm: Normal rate and regular rhythm.     Heart sounds: No murmur heard. Pulmonary:     Effort: Pulmonary effort is normal. No respiratory distress.     Breath sounds: Normal breath sounds.  Abdominal:     Palpations: Abdomen is soft.     Tenderness: There is no abdominal tenderness.  Musculoskeletal:        General: No swelling.     Cervical back: Normal range of motion and neck supple.  Skin:    General: Skin is warm and dry.     Capillary Refill: Capillary refill takes less than 2 seconds.  Neurological:     Mental Status: She is alert.  Psychiatric:        Mood and Affect: Mood normal.     (all labs ordered are listed, but only abnormal results are displayed) Labs Reviewed  RESP PANEL BY RT-PCR (RSV, FLU A&B, COVID)  [id number]  GROUP A STREP BY PCR    EKG: None  Radiology: DG Chest Portable 1 View Result Date: 08/29/2024 CLINICAL DATA:  Fever EXAM: PORTABLE CHEST 1 VIEW COMPARISON:  May 02, 2023 FINDINGS: The heart size and mediastinal contours are within normal limits. Both lungs are clear. The visualized skeletal structures are unremarkable. IMPRESSION: No active disease. Electronically Signed   By: Lynwood Landy Raddle M.D.   On: 08/29/2024 10:59     Procedures   Medications Ordered in the ED  dexamethasone  (DECADRON ) injection 10 mg  (has no administration in time range)  amoxicillin  (AMOXIL ) capsule 1,000 mg (has no administration in time range)                                    Medical Decision Making Amount and/or Complexity of Data Reviewed Radiology: ordered.  Risk Prescription drug management.   [name] [name] is here with sore throat.  Normal vitals.  No fever.  History of lupus and CKD.  Here with viral type symptoms.  No fever today.  She has had sore throat cough body aches for the last 5 days.  She has got some hoarseness to her voice she has got cobblestoning to the back of her throat but there is no obvious major pharyngitis.  I have no concern for abscess.  There is no submandibular swelling there is no trismus there is no drooling.  She has got a bit of a dry cough as well with maybe some mild sputum production.  Will get a chest x-ray check COVID flu RSV and strep testing.  I have no concern for sepsis.  This seems likely due to viral process at this hopefully now starting to resolve.  Overall COVID flu RSV test negative.  Chest x-ray negative for pneumonia.  Strep test had poor sample and needed to resend.  Ultimately decision was made to empirically treat her for strep and give steroids as well.  I do suspect likely some sort of sinusitis/bronchitis/pharyngitis.  Told to return if symptoms worsen but at this time I have no concern for other major infectious process at this time.  This chart was dictated using voice recognition software.  Despite best efforts to proofread,  errors can occur which can change the documentation meaning.      Final diagnoses:  Pharyngitis, unspecified etiology    ED Discharge Orders          Ordered    methylPREDNISolone  (MEDROL  DOSEPAK) 4 MG TBPK tablet        08/29/24 1326    amoxicillin  (AMOXIL ) 500 MG capsule  Daily        08/29/24 1326               Ruthe Cornet, DO 08/29/24 1327

## 2024-08-29 NOTE — Discharge Instructions (Signed)
 Take next dose of antibiotic and steroid tomorrow.  Follow-up with your primary care doctor.  Return if symptoms worsen.

## 2024-09-27 ENCOUNTER — Encounter (HOSPITAL_COMMUNITY): Payer: Self-pay

## 2024-09-27 ENCOUNTER — Emergency Department (HOSPITAL_COMMUNITY)
Admission: EM | Admit: 2024-09-27 | Discharge: 2024-09-27 | Disposition: A | Discharge status: Home / Self Care | Attending: Emergency Medicine | Admitting: Emergency Medicine

## 2024-09-27 DIAGNOSIS — I82401 Acute embolism and thrombosis of unspecified deep veins of right lower extremity: Secondary | ICD-10-CM | POA: Insufficient documentation

## 2024-09-27 DIAGNOSIS — Z7901 Long term (current) use of anticoagulants: Secondary | ICD-10-CM | POA: Diagnosis not present

## 2024-09-27 DIAGNOSIS — Z79899 Other long term (current) drug therapy: Secondary | ICD-10-CM | POA: Diagnosis not present

## 2024-09-27 DIAGNOSIS — Z91040 Latex allergy status: Secondary | ICD-10-CM | POA: Insufficient documentation

## 2024-09-27 DIAGNOSIS — M7989 Other specified soft tissue disorders: Secondary | ICD-10-CM | POA: Insufficient documentation

## 2024-09-27 NOTE — Discharge Instructions (Signed)
 You will need to return to the emergency department for an ultrasound of your left arm, unfortunately we do not have ultrasound services available at this facility at this hour, please follow the directions you have been provided with on when to return for your ultrasound.  If your symptoms worsen or if you develop any redness/warmth/pain at the site of your swollen arm, please return to the emergency department sooner.

## 2024-09-27 NOTE — ED Provider Notes (Signed)
 " [place] EMERGENCY DEPARTMENT AT [facility] Provider Note   CSN: [id number] Arrival date & time: 09/27/24  1805     Patient presents with: Vascular Access Problem   [name] [name] is a 29 y.o. female.   29 year old female presenting with left upper extremity swelling. Patient has a AV fistula in this arm, this was done in October of last year in anticipation that she may need dialysis, however she has not needed to start dialysis at this point.  History of lupus with subsequent kidney damage.  She states that she may have slept on my arm wrong, as when she woke up today she had swelling to the upper aspect of her arm that is nontender, denies fever/warmth to this area.  History of a right lower extremity DVT, on Eliquis, is compliant with this medication.        Prior to Admission medications  Medication Sig Start Date End Date Taking? Authorizing Provider  doxycycline  (VIBRAMYCIN ) 100 MG capsule Take 1 capsule (100 mg total) by mouth 2 (two) times daily. Patient not taking: Reported on 03/12/2024 11/30/23   Janet Lonni BRAVO, MD  DULoxetine  (CYMBALTA ) 30 MG capsule Take 1 capsule (30 mg total) by mouth 2 (two) times daily. Patient not taking: Reported on 03/12/2024 09/16/21   Parsons, Brittney E, NP  etonogestrel (NEXPLANON) 68 MG IMPL implant 68 mg by Subdermal route continuous. 06/25/20 05/07/24  [provider]  famotidine  (PEPCID ) 20 MG tablet Take 1 tablet (20 mg total) by mouth daily. 04/01/24   Nichols, Tonya S, NP  fluticasone  (FLONASE ) 50 MCG/ACT nasal spray Place 1 spray into both nostrils daily. Patient not taking: Reported on 03/12/2024 11/30/23   Janet Lonni BRAVO, MD  folic acid  (FOLVITE ) 1 MG tablet Take 1 tablet (1 mg total) by mouth at bedtime. 03/25/19   Shona Terry SAILOR, DO  hydroxychloroquine (PLAQUENIL) 200 MG tablet Take 100-200 mg by mouth See admin instructions. Takes 1 tablet in the morning and 1/2 tablet at night.    [provider]   losartan (COZAAR) 25 MG tablet Take 25 mg by mouth daily. Take once daily at  bedtime    [provider]  magnesium  (MAGTAB) 84 MG ( ) TBCR SR tablet Take 84 mg by mouth daily. 11/23/21   [provider]  methylPREDNISolone  (MEDROL  DOSEPAK) 4 MG TBPK tablet Follow package insert 08/29/24   Curatolo, Adam, DO  mirtazapine  (REMERON ) 15 MG tablet TAKE 1 TABLET (15 MG TOTAL) BY MOUTH DAILY. 07/08/24   Oley Bascom RAMAN, NP  mupirocin  ointment (BACTROBAN ) 2 % Apply 1 Application topically 2 (two) times daily. 03/31/23   Oley Bascom RAMAN, NP  mycophenolate  (CELLCEPT ) 500 MG tablet Take 1,500 mg by mouth 2 (two) times daily. 01/22/21   [provider]  omeprazole  (PRILOSEC) 40 MG capsule TAKE 1 CAPSULE (40 MG TOTAL) BY MOUTH DAILY. 08/06/24   Oley Bascom RAMAN, NP  PERIDEX  0.12 % solution 15 mLs 2 (two) times daily. 11/10/21   [provider]  Suvorexant  (BELSOMRA ) 5 MG TABS Take 5 mg by mouth at bedtime as needed. 09/16/21   Harl Zane BRAVO, NP  tacrolimus  (PROGRAF ) 1 MG capsule Take by mouth. 07/02/21   [provider]  triamcinolone  cream (KENALOG ) 0.1 % Apply to AA daily and follow up in 30 days 02/16/22   Sheffield, Kelli R, PA-C  VITAMIN D  PO Take by mouth. Patient not taking: Reported on 10/02/2023    [provider]  Vitamin D , Ergocalciferol , (  DRISDOL ) 50000 units CAPS capsule Take 1 capsule (50,000 Units total) by mouth 2 (two) times a week. 12/21/17   Dolphus Reiter, MD  XARELTO  10 MG TABS tablet TAKE 1 TABLET BY MOUTH DAILY WITH SUPPER 10/26/23   Timmy Maude SAUNDERS, MD    Allergies: Lisinopril  and Latex    Review of Systems  Updated Vital Signs BP (!) 168/109   Pulse 100   Temp 98.2 F (36.8 C) (Oral)   Resp 16   LMP 09/20/2024   SpO2 100%   Physical Exam Vitals and nursing note reviewed.  Constitutional:      General: She is not in acute distress.    Appearance: Normal appearance. She is not ill-appearing or toxic-appearing.   HENT:     Head: Normocephalic and atraumatic.  Eyes:     Extraocular Movements: Extraocular movements intact.     Pupils: Pupils are equal, round, and reactive to light.  Cardiovascular:     Rate and Rhythm: Normal rate.  Pulmonary:     Effort: Pulmonary effort is normal.  Musculoskeletal:     Cervical back: Normal range of motion.     Comments: Moves all extremities spontaneously without difficulty  Skin:    General: Skin is warm and dry.     Comments: LUE with soft tissue swelling in the region of the axilla, swelling is nontender, AV fistula with palpable thrill, there is no overlying erythema/warmth or evidence of cellulitic changes  Neurological:     General: No focal deficit present.     Mental Status: She is alert and oriented to person, place, and time.     (all labs ordered are listed, but only abnormal results are displayed) Labs Reviewed - No data to display   EKG: None  Radiology: No results found.   Procedures   Medications Ordered in the ED - No data to display  Clinical Course as of 09/27/24 2101  Fri Sep 27, 2024  29 -year-old female here for a mass of left upper arm.  She has had a fistula in place for a few months, not using it.  She noticed a large lump in her arm when she woke up from sleeping.  Distal has a positive thrill.  No signs of ischemic limb.  Unfortunately ultrasound not available at this time.  She is comfortable returning tomorrow to get a vascular ultrasound. [MB]    Clinical Course User Index [MB] Towana Ozell BROCKS, MD                                 Medical Decision Making This patient presents to the ED for concern of left upper extremity swelling, this involves an extensive number of treatment options, and is a complaint that carries with it a high risk of complications and morbidity.  The differential diagnosis includes vascular access issue, hematoma, DVT, other soft tissue swelling, cellulitis/erysipelas   Co morbidities  that complicate the patient evaluation  Lupus, AV fistula in left upper extremity but not on dialysis   Imaging Studies ordered:  LUE DVT US  ordered for completion tomorrow, as DVT US  is not available at this facility at this time   Social Determinants of Health:  Financial instability, stress, social isolation   Test / Admission - Considered:  Physical exam is notable as above, left upper extremity does have some soft tissue swelling in the region of the axilla, this area is nontender on exam,  there is no overlying erythema/warmth/concern for cellulitic changes at this time.  Patient does have an AV fistula in the left upper extremity, there is a palpable thrill, the patient is not experiencing any pain of the left upper extremity.  She does have a history of a DVT in her right lower extremity from several years ago, she is on Xarelto  and is compliant with this medication. At this point my concern is for the presence of a DVT, unfortunately we do not have DVT ultrasound imaging available at this facility at this time of day.  I have provided her with an outpatient order to return to [facility] tomorrow to have this ultrasound done, instructions are included in her after visit summary.  Patient is aware of this plan and is in agreement, she is already on Xarelto  so no further anticoagulation is necessary.  Staffed with Dr. Towana who independently evaluated this patient at the bedside   Amount and/or Complexity of Data Reviewed Labs: ordered.        Final diagnoses:  Left upper extremity swelling    ED Discharge Orders          Ordered    UE Venous Duplex       Comments: IMPORTANT PATIENT INSTRUCTIONS: You have been scheduled for an Outpatient Vascular Study at [facility].  If tomorrow is a Saturday, Sunday or holiday, please go to the [facility] Emergency Department Registration Desk at 11 am tomorrow morning and tell them you are there for a vascular study.  If tomorrow is  a weekday (Monday-Friday), please go to the [facility] (address [address] [place]) at 8 am and report to the 4th floor registration Zone A.  Inform registration that you are there for a vascular study.   09/27/24 2042    UE Venous Duplex       Comments: IMPORTANT PATIENT INSTRUCTIONS: You have been scheduled for an Outpatient Vascular Study at [facility].  If tomorrow is a Saturday, Sunday or holiday, please go to the [facility] Emergency Department Registration Desk at 11 am tomorrow morning and tell them you are there for a vascular study.  If tomorrow is a weekday (Monday-Friday), please go to the [facility] (address [address] [place]) at 8 am and report to the 4th floor registration Zone A.  Inform registration that you are there for a vascular study.   Pending               [name] LOISE DEVONNA 09/27/24 2101    Towana Ozell BROCKS, MD 09/28/24 [phone]  "

## 2024-09-27 NOTE — ED Triage Notes (Signed)
 Pt states that today she woke up and her fistula in her L arm is swollen, denies pain, thrill is palpable. Pt is not using fistula at this time for dialysis

## 2024-09-27 NOTE — ED Notes (Signed)
Attempted lab draw x 2 but unsuccessful. RN made aware.

## 2024-09-27 NOTE — ED Provider Triage Note (Signed)
 Emergency Medicine Provider Triage Evaluation Note  [name] [name] , a 29 y.o. female  was evaluated in triage.  Pt complains of left upper extremity swelling.  Patient has a AV fistula in this arm, this was done in October of last year in anticipation that she may need dialysis, however she has not needed to start dialysis at this point.  She states that she may have slept on my arm wrong, as when she woke up she had swelling to the upper aspect of her arm that is nontender.  History of a right lower extremity DVT, on Eliquis, is compliant with this medication.  Review of Systems  Positive: As above Negative: As above  Physical Exam  BP (!) 168/109   Pulse 100   Temp 98.2 F (36.8 C) (Oral)   Resp 16   LMP 09/20/2024   SpO2 100%  Gen:   Awake, no distress   Resp:  Normal effort  MSK:   Moves extremities without difficulty  Other:  Left upper extremity thrill is palpable, there is soft tissue swelling to the upper arm that is nontender  Medical Decision Making  Medically screening exam initiated at 7:50 PM.  Appropriate orders placed.  [name] [name] was informed that the remainder of the evaluation will be completed by another provider, this initial triage assessment does not replace that evaluation, and the importance of remaining in the ED until their evaluation is complete.  Patient would likely benefit from ultrasound imaging however it is now after 7 PM and DVT ultrasound is not available   [name] [name], [place] 09/27/24 1951

## 2024-09-28 ENCOUNTER — Ambulatory Visit (HOSPITAL_COMMUNITY): Admission: RE | Admit: 2024-09-28 | Discharge: 2024-09-28

## 2024-09-29 NOTE — Progress Notes (Signed)
 VASCULAR LAB    Patient no showed to Vascular Lab appointment 09/28/2024. Showed up to the [facility] ED 09/29/24 at 11am.  Explained to patient that we do not have walk-in hours, and we would have [employer] reach out to her to schedule an appointment and also let the patient know that she has a follow-up [place] appointment at [facility] 08/02/2025.  Patient stated she told the staff at [facility] ED Friday that she did not have transportation to come back on 1/17 and they told her she could come whenever she wanted.  I apologized that she had received incorrect information and reiterated [employer] ([facility]) would reach out to her tomorrow.  She asked if she checked herself back in, would we do it.  I told her that yes, we would have to do it, but we had patients awaiting preoperative and stat exams prior to her test.    Antrell Tipler, RVT 09/29/2024, 12:11 PM

## 2024-10-02 ENCOUNTER — Telehealth: Payer: Self-pay | Admitting: Nurse Practitioner

## 2024-10-02 ENCOUNTER — Ambulatory Visit: Payer: Self-pay | Admitting: Nurse Practitioner

## 2024-10-02 NOTE — Telephone Encounter (Signed)
 FYI. Pt has upcoming appt on 10/10/24.

## 2024-10-02 NOTE — Telephone Encounter (Unsigned)
 Copied from CRM #[id number]. Topic: Clinical - Refused Triage >> Oct 02, 2024 10:45 AM Logan F wrote: Patient/caller voiced complaints of Pt says she has swelling in her arm and was seen in the hospital for it and was told that it is normal and that its not an infection or blood clot so pt declined NT. Declined transfer to triage.  If patient is unestablished, route message to [facility] Nurse Triage If patient is established, route message to the appropriate department clinical pool

## 2024-10-08 ENCOUNTER — Telehealth: Payer: Self-pay

## 2024-10-08 NOTE — Transitions of Care (Post Inpatient/ED Visit) (Cosign Needed)
 "  10/08/2024  Name: [name] [name] MRN: [id number] DOB: 1996/06/10  Today's TOC FU Call Status: Today's TOC FU Call Status:: Unsuccessful Call (1st Attempt) Unsuccessful Call (1st Attempt) Date: 10/08/24  Patient's Name and Date of Birth confirmed.    Transition Care Management Follow-up Telephone Call Date of Discharge: 10/04/24 Discharge Facility: Other [facility]) Name of Other (Non[facility]) Discharge Facility: [facility] Type of Discharge: Emergency Department Reason for ED Visit: Other: (Swollen Arm) How have you been since you were released from the hospital?: Same Any questions or concerns?: No  Items Reviewed: Did you receive and understand the discharge instructions provided?: Yes Medications obtained,verified, and reconciled?: Yes (Medications Reviewed) Any new allergies since your discharge?: No Dietary orders reviewed?: No Do you have support at home?: Yes People in Home [RPT]: parent(s), sibling(s)  Medications Reviewed Today: Medications Reviewed Today     Reviewed by Cole Geroge SQUIBB, CMA (Certified Medical Assistant) on 10/08/24 at 1201  Med List Status: <None>   Medication Order Taking? Sig Documenting Provider Last Dose Status Informant  doxycycline  (VIBRAMYCIN ) 100 MG capsule [id number]  Take 1 capsule (100 mg total) by mouth 2 (two) times daily.  Patient not taking: Reported on 10/08/2024   Janet Lonni BRAVO, MD  Active   DULoxetine  (CYMBALTA ) 30 MG capsule [id number]  Take 1 capsule (30 mg total) by mouth 2 (two) times daily.  Patient not taking: Reported on 10/08/2024   Harl Zane BRAVO, NP  Active   etonogestrel (NEXPLANON) 68 MG IMPL implant [id number] Yes 68 mg by Subdermal route continuous. [provider]  Active Self           Med Note [name]   Tue May 18, 2021  9:05 PM) Year 2021   famotidine  (PEPCID ) 20 MG tablet [id number] Yes Take 1 tablet (20 mg total) by mouth daily. Nichols, Tonya S, NP  Active   fluticasone   (FLONASE ) 50 MCG/ACT nasal spray [id number] Yes Place 1 spray into both nostrils daily. Janet Lonni BRAVO, MD  Active   folic acid  (FOLVITE ) 1 MG tablet [id number] Yes Take 1 tablet (1 mg total) by mouth at bedtime. Shona Terry SAILOR, DO  Active   hydroxychloroquine (PLAQUENIL) 200 MG tablet [id number] Yes Take 100-200 mg by mouth See admin instructions. Takes 1 tablet in the morning and 1/2 tablet at night. [provider]  Active Self  losartan (COZAAR) 25 MG tablet [id number] Yes Take 25 mg by mouth daily. Take once daily at  bedtime [provider]  Active Self  magnesium  (MAGTAB) 84 MG ( ) TBCR SR tablet [id number]  Take 84 mg by mouth daily. [provider]  Active   methylPREDNISolone  (MEDROL  DOSEPAK) 4 MG TBPK tablet [id number] Yes Follow package insert Ruthe Cornet, DO  Active   mirtazapine  (REMERON ) 15 MG tablet [id number] Yes TAKE 1 TABLET (15 MG TOTAL) BY MOUTH DAILY. Oley Bascom RAMAN, NP  Active   mupirocin  ointment (BACTROBAN ) 2 % [id number] Yes Apply 1 Application topically 2 (two) times daily. Oley Bascom RAMAN, NP  Active   mycophenolate  (CELLCEPT ) 500 MG tablet [id number] Yes Take 1,500 mg by mouth 2 (two) times daily. [provider]  Active Self  omeprazole  (PRILOSEC) 40 MG capsule [id number] Yes TAKE 1 CAPSULE (40 MG TOTAL) BY MOUTH DAILY. Oley Bascom RAMAN, NP  Active   PERIDEX  0.12 % solution [id number] Yes 15 mLs 2 (two) times daily. [provider]  Active   Suvorexant  (BELSOMRA ) 5 MG TABS [id number] Yes Take 5 mg by  mouth at bedtime as needed. Harl Regan E, NP  Active   tacrolimus  (PROGRAF ) 1 MG capsule [id number] Yes Take by mouth. [provider]  Active   triamcinolone  cream (KENALOG ) 0.1 % [id number] Yes Apply to AA daily and follow up in 30 days Sheffield, Kelli R, PA-C  Active   VITAMIN D  PO [id number] Yes Take by mouth. [provider]  Active   Vitamin D , Ergocalciferol , (DRISDOL ) 50000 units CAPS capsule  [id number] Yes Take 1 capsule (50,000 Units total) by mouth 2 (two) times a week. Dolphus Reiter, MD  Active            Med Note IRVIN, ASHELEY G   Tue May 28, 2019  1:47 PM)    XARELTO  10 MG TABS tablet [id number]  TAKE 1 TABLET BY MOUTH DAILY WITH SUPPER  Patient not taking: Reported on 10/08/2024   Timmy Maude SAUNDERS, MD  Active             Home Care and Equipment/Supplies: Were Home Health Services Ordered?: No Any new equipment or medical supplies ordered?: No  Functional Questionnaire: Do you need assistance with bathing/showering or dressing?: No Do you need assistance with meal preparation?: No Do you need assistance with eating?: No Do you have difficulty maintaining continence: No Do you need assistance with getting out of bed/getting out of a chair/moving?: No Do you have difficulty managing or taking your medications?: No  Follow up appointments reviewed: PCP Follow-up appointment confirmed?: Yes Date of PCP follow-up appointment?: 10/10/24 Specialist Hospital Follow-up appointment confirmed?: No Reason Specialist Follow-Up Not Confirmed: Appointment Sceduled by [facility] Calling Clinician Do you need transportation to your follow-up appointment?: No Do you understand care options if your condition(s) worsen?: Yes-patient verbalized understanding  SDOH Interventions Today    Flowsheet Row Most Recent Value  SDOH Interventions   Food Insecurity Interventions Intervention Not Indicated  Housing Interventions Intervention Not Indicated  Transportation Interventions Intervention Not Indicated  Utilities Interventions Intervention Not Indicated    SIGNATURE Geroge Hahn, CMA   "

## 2024-10-10 ENCOUNTER — Inpatient Hospital Stay: Payer: Self-pay | Admitting: Nurse Practitioner

## 2024-10-21 ENCOUNTER — Inpatient Hospital Stay: Payer: Self-pay | Admitting: Nurse Practitioner

## 2024-10-24 ENCOUNTER — Inpatient Hospital Stay: Admitting: Nurse Practitioner
